# Patient Record
Sex: Female | Born: 1955
Health system: Southern US, Community
[De-identification: ages and names within clinical notes are randomized; demographics above are authoritative.]

## PROBLEM LIST (undated history)

## (undated) DIAGNOSIS — Z9221 Personal history of antineoplastic chemotherapy: Secondary | ICD-10-CM

## (undated) DIAGNOSIS — H269 Unspecified cataract: Secondary | ICD-10-CM

## (undated) DIAGNOSIS — G2581 Restless legs syndrome: Secondary | ICD-10-CM

## (undated) DIAGNOSIS — G4730 Sleep apnea, unspecified: Secondary | ICD-10-CM

## (undated) DIAGNOSIS — T4145XA Adverse effect of unspecified anesthetic, initial encounter: Secondary | ICD-10-CM

## (undated) DIAGNOSIS — C50412 Malignant neoplasm of upper-outer quadrant of left female breast: Secondary | ICD-10-CM

## (undated) DIAGNOSIS — Z803 Family history of malignant neoplasm of breast: Secondary | ICD-10-CM

## (undated) DIAGNOSIS — F419 Anxiety disorder, unspecified: Secondary | ICD-10-CM

## (undated) DIAGNOSIS — R232 Flushing: Secondary | ICD-10-CM

## (undated) DIAGNOSIS — T8859XA Other complications of anesthesia, initial encounter: Secondary | ICD-10-CM

## (undated) DIAGNOSIS — F32A Depression, unspecified: Secondary | ICD-10-CM

## (undated) DIAGNOSIS — C50919 Malignant neoplasm of unspecified site of unspecified female breast: Secondary | ICD-10-CM

## (undated) DIAGNOSIS — Z923 Personal history of irradiation: Secondary | ICD-10-CM

## (undated) DIAGNOSIS — M1990 Unspecified osteoarthritis, unspecified site: Secondary | ICD-10-CM

## (undated) DIAGNOSIS — Z808 Family history of malignant neoplasm of other organs or systems: Secondary | ICD-10-CM

## (undated) DIAGNOSIS — F329 Major depressive disorder, single episode, unspecified: Secondary | ICD-10-CM

## (undated) DIAGNOSIS — R519 Headache, unspecified: Secondary | ICD-10-CM

## (undated) DIAGNOSIS — S12100A Unspecified displaced fracture of second cervical vertebra, initial encounter for closed fracture: Secondary | ICD-10-CM

## (undated) DIAGNOSIS — M542 Cervicalgia: Secondary | ICD-10-CM

## (undated) DIAGNOSIS — K219 Gastro-esophageal reflux disease without esophagitis: Secondary | ICD-10-CM

## (undated) DIAGNOSIS — K819 Cholecystitis, unspecified: Secondary | ICD-10-CM

## (undated) HISTORY — DX: Family history of malignant neoplasm of other organs or systems: Z80.8

## (undated) HISTORY — PX: TARSAL TUNNEL RELEASE: SUR1099

## (undated) HISTORY — PX: BREAST BIOPSY: SHX20

## (undated) HISTORY — PX: FOOT SURGERY: SHX648

## (undated) HISTORY — PX: BUNIONECTOMY: SHX129

## (undated) HISTORY — DX: Flushing: R23.2

## (undated) HISTORY — DX: Malignant neoplasm of upper-outer quadrant of left female breast: C50.412

## (undated) HISTORY — PX: ELBOW SURGERY: SHX618

## (undated) HISTORY — PX: BACK SURGERY: SHX140

## (undated) HISTORY — DX: Anxiety disorder, unspecified: F41.9

## (undated) HISTORY — DX: Family history of malignant neoplasm of breast: Z80.3

## (undated) HISTORY — PX: BREAST LUMPECTOMY: SHX2

---

## 1999-06-08 ENCOUNTER — Other Ambulatory Visit: Admission: RE | Admit: 1999-06-08 | Discharge: 1999-06-08 | Payer: Self-pay | Admitting: Internal Medicine

## 1999-06-27 ENCOUNTER — Encounter: Payer: Self-pay | Admitting: Internal Medicine

## 1999-06-27 ENCOUNTER — Ambulatory Visit (HOSPITAL_COMMUNITY): Admission: RE | Admit: 1999-06-27 | Discharge: 1999-06-27 | Payer: Self-pay | Admitting: Internal Medicine

## 1999-07-19 ENCOUNTER — Other Ambulatory Visit: Admission: RE | Admit: 1999-07-19 | Discharge: 1999-07-19 | Payer: Self-pay | Admitting: Internal Medicine

## 2000-09-16 ENCOUNTER — Ambulatory Visit (HOSPITAL_COMMUNITY): Admission: RE | Admit: 2000-09-16 | Discharge: 2000-09-16 | Payer: Self-pay | Admitting: Internal Medicine

## 2000-09-16 ENCOUNTER — Encounter: Payer: Self-pay | Admitting: Internal Medicine

## 2000-12-11 ENCOUNTER — Encounter: Admission: RE | Admit: 2000-12-11 | Discharge: 2000-12-11 | Payer: Self-pay | Admitting: Internal Medicine

## 2000-12-11 ENCOUNTER — Encounter: Payer: Self-pay | Admitting: Internal Medicine

## 2001-01-30 ENCOUNTER — Encounter: Payer: Self-pay | Admitting: Internal Medicine

## 2001-01-30 ENCOUNTER — Encounter: Admission: RE | Admit: 2001-01-30 | Discharge: 2001-01-30 | Payer: Self-pay | Admitting: Internal Medicine

## 2002-01-15 ENCOUNTER — Other Ambulatory Visit: Admission: RE | Admit: 2002-01-15 | Discharge: 2002-01-15 | Payer: Self-pay | Admitting: Gynecology

## 2002-02-13 ENCOUNTER — Encounter: Payer: Self-pay | Admitting: Gynecology

## 2002-02-13 ENCOUNTER — Ambulatory Visit (HOSPITAL_COMMUNITY): Admission: RE | Admit: 2002-02-13 | Discharge: 2002-02-13 | Payer: Self-pay | Admitting: Gynecology

## 2003-02-19 ENCOUNTER — Ambulatory Visit (HOSPITAL_COMMUNITY): Admission: RE | Admit: 2003-02-19 | Discharge: 2003-02-19 | Payer: Self-pay | Admitting: Gynecology

## 2003-02-19 ENCOUNTER — Encounter: Payer: Self-pay | Admitting: Gynecology

## 2003-03-08 ENCOUNTER — Other Ambulatory Visit: Admission: RE | Admit: 2003-03-08 | Discharge: 2003-03-08 | Payer: Self-pay | Admitting: Gynecology

## 2003-04-13 ENCOUNTER — Encounter: Payer: Self-pay | Admitting: Orthopaedic Surgery

## 2003-04-13 ENCOUNTER — Encounter: Admission: RE | Admit: 2003-04-13 | Discharge: 2003-04-13 | Payer: Self-pay | Admitting: Orthopaedic Surgery

## 2004-04-11 ENCOUNTER — Ambulatory Visit (HOSPITAL_COMMUNITY): Admission: RE | Admit: 2004-04-11 | Discharge: 2004-04-11 | Payer: Self-pay | Admitting: Gynecology

## 2004-04-26 ENCOUNTER — Encounter: Admission: RE | Admit: 2004-04-26 | Discharge: 2004-04-26 | Payer: Self-pay | Admitting: Gynecology

## 2004-06-08 ENCOUNTER — Other Ambulatory Visit: Admission: RE | Admit: 2004-06-08 | Discharge: 2004-06-08 | Payer: Self-pay | Admitting: Gynecology

## 2005-02-01 ENCOUNTER — Encounter: Admission: RE | Admit: 2005-02-01 | Discharge: 2005-02-01 | Payer: Self-pay | Admitting: Orthopaedic Surgery

## 2005-05-16 ENCOUNTER — Ambulatory Visit (HOSPITAL_COMMUNITY): Admission: RE | Admit: 2005-05-16 | Discharge: 2005-05-16 | Payer: Self-pay | Admitting: Gynecology

## 2005-07-11 ENCOUNTER — Other Ambulatory Visit: Admission: RE | Admit: 2005-07-11 | Discharge: 2005-07-11 | Payer: Self-pay | Admitting: Gynecology

## 2006-06-03 ENCOUNTER — Ambulatory Visit (HOSPITAL_COMMUNITY): Admission: RE | Admit: 2006-06-03 | Discharge: 2006-06-03 | Payer: Self-pay | Admitting: Gynecology

## 2006-07-24 ENCOUNTER — Other Ambulatory Visit: Admission: RE | Admit: 2006-07-24 | Discharge: 2006-07-24 | Payer: Self-pay | Admitting: Gynecology

## 2006-12-24 ENCOUNTER — Encounter: Admission: RE | Admit: 2006-12-24 | Discharge: 2006-12-24 | Payer: Self-pay | Admitting: Orthopaedic Surgery

## 2007-02-14 ENCOUNTER — Ambulatory Visit (HOSPITAL_COMMUNITY): Admission: RE | Admit: 2007-02-14 | Discharge: 2007-02-14 | Payer: Self-pay | Admitting: Neurology

## 2007-03-16 ENCOUNTER — Emergency Department (HOSPITAL_COMMUNITY): Admission: EM | Admit: 2007-03-16 | Discharge: 2007-03-16 | Payer: Self-pay | Admitting: Emergency Medicine

## 2007-06-20 ENCOUNTER — Ambulatory Visit (HOSPITAL_COMMUNITY): Admission: RE | Admit: 2007-06-20 | Discharge: 2007-06-20 | Payer: Self-pay | Admitting: Gynecology

## 2007-08-13 ENCOUNTER — Other Ambulatory Visit: Admission: RE | Admit: 2007-08-13 | Discharge: 2007-08-13 | Payer: Self-pay | Admitting: Obstetrics and Gynecology

## 2008-06-25 ENCOUNTER — Ambulatory Visit (HOSPITAL_COMMUNITY): Admission: RE | Admit: 2008-06-25 | Discharge: 2008-06-25 | Payer: Self-pay | Admitting: Gynecology

## 2008-09-15 ENCOUNTER — Encounter: Payer: Self-pay | Admitting: Women's Health

## 2008-09-15 ENCOUNTER — Other Ambulatory Visit: Admission: RE | Admit: 2008-09-15 | Discharge: 2008-09-15 | Payer: Self-pay | Admitting: Obstetrics and Gynecology

## 2008-09-15 ENCOUNTER — Ambulatory Visit: Payer: Self-pay | Admitting: Women's Health

## 2009-06-30 ENCOUNTER — Ambulatory Visit (HOSPITAL_COMMUNITY): Admission: RE | Admit: 2009-06-30 | Discharge: 2009-06-30 | Payer: Self-pay | Admitting: Gynecology

## 2009-07-14 ENCOUNTER — Encounter: Admission: RE | Admit: 2009-07-14 | Discharge: 2009-07-14 | Payer: Self-pay | Admitting: Gynecology

## 2009-09-30 ENCOUNTER — Encounter: Payer: Self-pay | Admitting: Women's Health

## 2009-09-30 ENCOUNTER — Other Ambulatory Visit: Admission: RE | Admit: 2009-09-30 | Discharge: 2009-09-30 | Payer: Self-pay | Admitting: Obstetrics and Gynecology

## 2009-09-30 ENCOUNTER — Ambulatory Visit: Payer: Self-pay | Admitting: Women's Health

## 2009-10-03 ENCOUNTER — Ambulatory Visit: Payer: Self-pay | Admitting: Gynecology

## 2009-12-15 ENCOUNTER — Emergency Department (HOSPITAL_COMMUNITY): Admission: EM | Admit: 2009-12-15 | Discharge: 2009-12-15 | Payer: Self-pay | Admitting: Emergency Medicine

## 2010-12-24 ENCOUNTER — Encounter: Payer: Self-pay | Admitting: Gynecology

## 2010-12-25 ENCOUNTER — Encounter: Payer: Self-pay | Admitting: Gynecology

## 2011-04-03 HISTORY — PX: SPINAL FUSION: SHX223

## 2011-08-07 ENCOUNTER — Ambulatory Visit (INDEPENDENT_AMBULATORY_CARE_PROVIDER_SITE_OTHER): Payer: Self-pay | Admitting: Family Medicine

## 2011-08-07 DIAGNOSIS — E669 Obesity, unspecified: Secondary | ICD-10-CM

## 2011-08-09 ENCOUNTER — Other Ambulatory Visit (HOSPITAL_COMMUNITY)
Admission: RE | Admit: 2011-08-09 | Discharge: 2011-08-09 | Disposition: A | Discharge status: Home / Self Care | Payer: 59 | Source: Ambulatory Visit | Attending: Internal Medicine | Admitting: Internal Medicine

## 2011-08-09 ENCOUNTER — Other Ambulatory Visit (HOSPITAL_COMMUNITY): Payer: Self-pay | Admitting: Internal Medicine

## 2011-08-09 ENCOUNTER — Other Ambulatory Visit: Payer: Self-pay | Admitting: Internal Medicine

## 2011-08-09 DIAGNOSIS — Z01419 Encounter for gynecological examination (general) (routine) without abnormal findings: Secondary | ICD-10-CM | POA: Insufficient documentation

## 2011-08-09 DIAGNOSIS — Z1159 Encounter for screening for other viral diseases: Secondary | ICD-10-CM | POA: Insufficient documentation

## 2011-08-09 DIAGNOSIS — Z1231 Encounter for screening mammogram for malignant neoplasm of breast: Secondary | ICD-10-CM

## 2011-08-17 ENCOUNTER — Ambulatory Visit (HOSPITAL_COMMUNITY)
Admission: RE | Admit: 2011-08-17 | Discharge: 2011-08-17 | Disposition: A | Discharge status: Home / Self Care | Payer: 59 | Source: Ambulatory Visit | Attending: Internal Medicine | Admitting: Internal Medicine

## 2011-08-17 DIAGNOSIS — Z1231 Encounter for screening mammogram for malignant neoplasm of breast: Secondary | ICD-10-CM | POA: Insufficient documentation

## 2011-08-22 ENCOUNTER — Other Ambulatory Visit: Payer: Self-pay | Admitting: Internal Medicine

## 2011-08-22 DIAGNOSIS — R928 Other abnormal and inconclusive findings on diagnostic imaging of breast: Secondary | ICD-10-CM

## 2011-09-05 ENCOUNTER — Ambulatory Visit
Admission: RE | Admit: 2011-09-05 | Discharge: 2011-09-05 | Disposition: A | Discharge status: Home / Self Care | Payer: 59 | Source: Ambulatory Visit | Attending: Internal Medicine | Admitting: Internal Medicine

## 2011-09-05 DIAGNOSIS — R928 Other abnormal and inconclusive findings on diagnostic imaging of breast: Secondary | ICD-10-CM

## 2012-01-25 ENCOUNTER — Other Ambulatory Visit (HOSPITAL_COMMUNITY): Payer: Self-pay | Admitting: Neurosurgery

## 2012-01-25 DIAGNOSIS — M5137 Other intervertebral disc degeneration, lumbosacral region: Secondary | ICD-10-CM

## 2012-02-01 ENCOUNTER — Ambulatory Visit (HOSPITAL_COMMUNITY)
Admission: RE | Admit: 2012-02-01 | Discharge: 2012-02-01 | Disposition: A | Discharge status: Home / Self Care | Payer: 59 | Source: Ambulatory Visit | Attending: Neurosurgery | Admitting: Neurosurgery

## 2012-02-01 DIAGNOSIS — M5137 Other intervertebral disc degeneration, lumbosacral region: Secondary | ICD-10-CM

## 2012-02-01 DIAGNOSIS — Q762 Congenital spondylolisthesis: Secondary | ICD-10-CM | POA: Insufficient documentation

## 2012-02-01 DIAGNOSIS — M5126 Other intervertebral disc displacement, lumbar region: Secondary | ICD-10-CM | POA: Insufficient documentation

## 2012-02-01 DIAGNOSIS — N281 Cyst of kidney, acquired: Secondary | ICD-10-CM | POA: Insufficient documentation

## 2012-02-01 DIAGNOSIS — M79609 Pain in unspecified limb: Secondary | ICD-10-CM | POA: Insufficient documentation

## 2012-02-01 DIAGNOSIS — M545 Low back pain, unspecified: Secondary | ICD-10-CM | POA: Insufficient documentation

## 2012-02-01 DIAGNOSIS — Q7649 Other congenital malformations of spine, not associated with scoliosis: Secondary | ICD-10-CM | POA: Insufficient documentation

## 2012-02-01 DIAGNOSIS — M47817 Spondylosis without myelopathy or radiculopathy, lumbosacral region: Secondary | ICD-10-CM | POA: Insufficient documentation

## 2012-02-15 ENCOUNTER — Other Ambulatory Visit (HOSPITAL_COMMUNITY): Payer: Self-pay | Admitting: Internal Medicine

## 2012-02-15 DIAGNOSIS — N289 Disorder of kidney and ureter, unspecified: Secondary | ICD-10-CM

## 2012-02-18 ENCOUNTER — Ambulatory Visit (HOSPITAL_COMMUNITY)
Admission: RE | Admit: 2012-02-18 | Discharge: 2012-02-18 | Disposition: A | Discharge status: Home / Self Care | Payer: 59 | Source: Ambulatory Visit | Attending: Internal Medicine | Admitting: Internal Medicine

## 2012-02-18 DIAGNOSIS — N289 Disorder of kidney and ureter, unspecified: Secondary | ICD-10-CM | POA: Insufficient documentation

## 2012-02-21 ENCOUNTER — Other Ambulatory Visit: Payer: Self-pay | Admitting: Neurosurgery

## 2012-02-25 ENCOUNTER — Other Ambulatory Visit: Payer: Self-pay | Admitting: Internal Medicine

## 2012-02-25 DIAGNOSIS — N6009 Solitary cyst of unspecified breast: Secondary | ICD-10-CM

## 2012-03-07 ENCOUNTER — Ambulatory Visit
Admission: RE | Admit: 2012-03-07 | Discharge: 2012-03-07 | Disposition: A | Discharge status: Home / Self Care | Payer: 59 | Source: Ambulatory Visit | Attending: Internal Medicine | Admitting: Internal Medicine

## 2012-03-07 DIAGNOSIS — N6009 Solitary cyst of unspecified breast: Secondary | ICD-10-CM

## 2012-03-14 ENCOUNTER — Other Ambulatory Visit (HOSPITAL_COMMUNITY): Payer: Self-pay | Admitting: Orthopaedic Surgery

## 2012-03-14 DIAGNOSIS — M25562 Pain in left knee: Secondary | ICD-10-CM

## 2012-03-19 ENCOUNTER — Encounter (HOSPITAL_COMMUNITY): Payer: Self-pay | Admitting: Pharmacy Technician

## 2012-03-20 ENCOUNTER — Ambulatory Visit (HOSPITAL_COMMUNITY)
Admission: RE | Admit: 2012-03-20 | Discharge: 2012-03-20 | Disposition: A | Discharge status: Home / Self Care | Payer: 59 | Source: Ambulatory Visit | Attending: Orthopaedic Surgery | Admitting: Orthopaedic Surgery

## 2012-03-20 DIAGNOSIS — M25562 Pain in left knee: Secondary | ICD-10-CM

## 2012-03-20 DIAGNOSIS — M25469 Effusion, unspecified knee: Secondary | ICD-10-CM | POA: Insufficient documentation

## 2012-03-20 DIAGNOSIS — M1710 Unilateral primary osteoarthritis, unspecified knee: Secondary | ICD-10-CM | POA: Insufficient documentation

## 2012-03-28 ENCOUNTER — Encounter (HOSPITAL_COMMUNITY): Payer: Self-pay

## 2012-03-28 ENCOUNTER — Encounter (HOSPITAL_COMMUNITY)
Admission: RE | Admit: 2012-03-28 | Discharge: 2012-03-28 | Disposition: A | Discharge status: Home / Self Care | Payer: 59 | Source: Ambulatory Visit | Attending: Neurosurgery | Admitting: Neurosurgery

## 2012-03-28 HISTORY — DX: Unspecified osteoarthritis, unspecified site: M19.90

## 2012-03-28 LAB — CBC
HCT: 39.1 % (ref 36.0–46.0)
Hemoglobin: 13 g/dL (ref 12.0–15.0)
MCH: 29.7 pg (ref 26.0–34.0)
MCHC: 33.2 g/dL (ref 30.0–36.0)
MCV: 89.5 fL (ref 78.0–100.0)
Platelets: 246 10*3/uL (ref 150–400)
RBC: 4.37 MIL/uL (ref 3.87–5.11)
RDW: 13.2 % (ref 11.5–15.5)
WBC: 5.5 10*3/uL (ref 4.0–10.5)

## 2012-03-28 LAB — TYPE AND SCREEN
ABO/RH(D): A POS
Antibody Screen: NEGATIVE

## 2012-03-28 LAB — ABO/RH: ABO/RH(D): A POS

## 2012-03-28 LAB — SURGICAL PCR SCREEN
MRSA, PCR: NEGATIVE
Staphylococcus aureus: NEGATIVE

## 2012-03-28 NOTE — Pre-Procedure Instructions (Signed)
20 Alexis Fox  03/28/2012   Your procedure is scheduled on:  04/04/2012  Report to [facility] at 5:30 AM.  Call this number if you have problems the morning of surgery: [phone]   Remember: Dicontinue Aspirin, Coumadin, Effient, Plavix and herbal medications.   Do not eat food:After Midnight.  May have clear liquids: up to 4 Hours before arrival (1:30 AM).  Clear liquids include soda, tea, black coffee, apple or grape juice, broth.  Take these medicines the morning of surgery with A SIP OF WATER: Celexa, Sinemet, Klonopin, Nasonex, Mirapex   Do not wear jewelry, make-up or nail polish.  Do not wear lotions, powders, or perfumes. You may wear deodorant.  Do not shave 48 hours prior to surgery.  Do not bring valuables to the hospital.  Contacts, dentures or bridgework may not be worn into surgery.  Leave suitcase in the car. After surgery it may be brought to your room.  For patients admitted to the hospital, checkout time is 11:00 AM the day of discharge.   Patients discharged the day of surgery will not be allowed to drive home.   Special Instructions: CHG Shower Use Special Wash: 1/2 bottle night before surgery and 1/2 bottle morning of surgery.   Please read over the following fact sheets that you were given: Pain Booklet, Coughing and Deep Breathing, Blood Transfusion Information, MRSA Information and Surgical Site Infection Prevention

## 2012-04-03 MED ORDER — CEFAZOLIN SODIUM-DEXTROSE 2-3 GM-% IV SOLR
2.0000 g | INTRAVENOUS | Status: AC
  Administered 2012-04-04: 2 g via INTRAVENOUS

## 2012-04-03 NOTE — H&P (Signed)
Alexis Fox  #[id number] DOB:  [birth year] 02/13/2012:    Alexis Fox returns today to review the MRI of her lumbar spine.  She had significant spondylosis at L5-S1 to the right with a foraminal encroachment at the right L5 nerve root from the supra-articular process of the facet joint as well as a disc protrusion. She has disc degeneration at this level as well.  She has some milder facet arthropathy at L4-5 on the left and central annular tear without significant L4 nerve root compression.  She says that her pain in the right leg continues to be a significant problem for her.    I have advised her that if we are going to treat this, I would recommend decompression and fusion at the L5-S1 level.  I do not believe the L4-5 level is sufficiently affected that it would need to be addressed.  Incidental note was made of an irregularly margined right inferior renal pole cyst and I suggested that she followup with her primary physician regarding.  Radiology has recommended an ultrasound of her kidney.  She wishes to go ahead and work on her physical conditioning and to welcome her grandchild in June and potentially go ahead with surgery in July and I will plan on seeing her back in the office in July to see how she is doing and make further plans.         Danae Orleans. 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  She describes that one year ago she had low back pain which resolved after about 2 months, but recently she has had low back and right greater than left lower extremity pain.  She describes that this goes into her right thigh and her right leg gives way at her knee and she has had some recent left leg tingling and left foot numbness.  She has had years of low back pain.  She says it has been much worse  over the last 1 to 2 months and that she has numbness in her right foot over the last year.  She says this began after she lifted Christmas boxes about a year ago.  She has had injections by Dr. Alvester Morin on 04/26/2011, right L5-S1 block and another injection at another level neither of which gave her any relief.  She takes Vicodin 5/325 "periodically" and Voltaren 75 mg "periodically".  She has a history of restless leg syndrome and depression.  She has had prior surgery on her right elbow, multiple foot and ankle surgeries since her teens.  She notes that she has gained a considerable amount of weight in the last 7 years and this is approximately 60 lbs.  She says, "I hurt in lots of places."  She says that she has impaired feeling in the outside toes and heel on the right foot.  She denies significant weakness.  She does note a history of chronic low back pain and pain across the lumbosacral junction, along the side of both of her thighs.    REVIEW OF SYSTEMS:   Review of Systems was reviewed with the patient.  Pertinent positives include wears glasses, leg pain while walking, leg weakness, back pain, leg pain, depression, restless leg syndrome.    PAST MEDICAL HISTORY:  Current Medical Conditions:  As previously described.      Prior Operations and Hospitalizations:  As previously described.      Medications and Allergies:  Loratadine 10 mg qd, Citalopram 40 mg qd, Estradiol 0.5 mg qd, Pramipexole 0.5 mg qd, Medroxyprogesterone 2.5 mg qd, Clonazepam 1 mg qd, Fluticasone qd, Sinemet 25/100 prn, Diclofenac 75 mg bid prn and Hydrocodone prn.  No known drug allergies.      Height and Weight:  She is 5', 4" tall and 190 lbs.  BMI is 32.6.    FAMILY HISTORY:    Mother is 55 in poor health with a pacemaker and pulmonary fibrosis.  Father died in his 1's of astrocytoma.    SOCIAL HISTORY:    She denies tobacco, alcohol or drug use.    DIAGNOSTIC STUDIES:   Plain radiographs were obtained in the office  today which show retrolisthesis of L5 on S1 and sacralized S1-S2 levels with degenerative disc disease L5-S1 and L4-5 levels with four lumbar vertebrae.    PHYSICAL EXAMINATION:      General Appearance:  Alexis Fox is a pleasant, cooperative woman in no acute distress.      Blood Pressure, Pulse and Respiratory Rate:  Her blood pressure is 114/70.  Heart rate is 78 and regular.  Respiratory rate is 18.        HEENT - normocephalic, atraumatic.  The pupils are equal, round and reactive to light.  The extraocular muscles are intact.  Sclerae - white.  Conjunctiva - pink.  Oropharynx benign.  Uvula midline.     Neck - there are no masses, meningismus, deformities, tracheal deviation, jugular vein distention or carotid bruits.  There is normal cervical range of motion.  Spurlings' test is negative without reproducible radicular pain turning the patient's head to either side.  Lhermitte's sign is not present with axial compression.      Respiratory - there is normal respiratory effort with good intercostal function.  Lungs are clear to auscultation.  There are no rales, rhonchi or wheezes.      Cardiovascular - the heart has regular rate and rhythm to auscultation.  No murmurs are appreciated.  There is no extremity edema, cyanosis or clubbing.  There are palpable pedal pulses.      Abdomen - soft, nontender, no hepatosplenomegaly appreciated or masses.  There are active bowel sounds.  No guarding or rebound.      Musculoskeletal Examination - she has pain at the lumbosacral junction.  She is able to bend to touch her toes, able to stand on her heels and toes.  She has a negative straight leg raise bilaterally.    NEUROLOGICAL EXAMINATION: The patient is oriented to time, person and place and has good recall of both recent and remote memory with normal attention span and concentration.  The patient speaks with clear and fluent speech and exhibits normal language function and appropriate fund of  knowledge.      Cranial Nerve Examination - pupils are equal, round and reactive to light.  Extraocular movements are full.  Visual fields are full to confrontational testing.  Facial sensation and facial movement are symmetric and intact.  Hearing is intact to finger rub.  Palate is upgoing.  Shoulder shrug is symmetric.  Tongue protrudes in the midline.      Motor Examination - motor strength is 5/5 in the bilateral deltoids, biceps, triceps, handgrips, wrist extensors, interosseous.  In the lower extremities motor strength is 5/5 in hip flexion,  extension, quadriceps, hamstrings, plantar flexion, dorsiflexion and extensor hallucis longus.      Sensory Examination - normal to light touch and pinprick sensation in the upper and lower extremities with the exception of decreased pin sensation in L5 and S1 distributions on the right.      Deep Tendon Reflexes - 2 in the biceps, tricep and brachioradialis, 2 at the knees, 2 at the left ankle, absent at the right ankle.  Great toes are downgoing to plantar stimulation.      Cerebellar Examination - normal coordination in upper and lower extremities and normal rapid alternating movements.  Romberg test is negative.    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  She has absent ankle jerk on the right.  She has hypalgesia to pinprick in L5 and S1 distributions.    I have recommended Alexis Fox get an MRI done of her lumbar spine.  She wants to do this in an open scanner or with premedication.  We will plan on doing the MRI in the near future and I will see her back after that has been done.    [facility]    Danae Orleans. Venetia Maxon, M.D.

## 2012-04-04 ENCOUNTER — Inpatient Hospital Stay (HOSPITAL_COMMUNITY)
Admission: RE | Admit: 2012-04-04 | Discharge: 2012-04-08 | DRG: 460 | Disposition: A | Discharge status: Home / Self Care | Payer: 59 | Source: Ambulatory Visit | Attending: Neurosurgery | Admitting: Neurosurgery

## 2012-04-04 ENCOUNTER — Encounter (HOSPITAL_COMMUNITY)
Admission: RE | Disposition: A | Discharge status: Home / Self Care | Payer: Self-pay | Source: Ambulatory Visit | Attending: Neurosurgery

## 2012-04-04 ENCOUNTER — Encounter (HOSPITAL_COMMUNITY): Payer: Self-pay | Admitting: *Deleted

## 2012-04-04 ENCOUNTER — Ambulatory Visit (HOSPITAL_COMMUNITY): Payer: 59 | Admitting: Anesthesiology

## 2012-04-04 ENCOUNTER — Inpatient Hospital Stay (HOSPITAL_COMMUNITY): Payer: 59

## 2012-04-04 ENCOUNTER — Encounter (HOSPITAL_COMMUNITY): Payer: Self-pay | Admitting: Anesthesiology

## 2012-04-04 ENCOUNTER — Encounter (HOSPITAL_COMMUNITY): Payer: Self-pay | Admitting: General Practice

## 2012-04-04 ENCOUNTER — Ambulatory Visit (HOSPITAL_COMMUNITY): Payer: 59

## 2012-04-04 DIAGNOSIS — M4120 Other idiopathic scoliosis, site unspecified: Secondary | ICD-10-CM | POA: Diagnosis present

## 2012-04-04 DIAGNOSIS — I959 Hypotension, unspecified: Secondary | ICD-10-CM | POA: Diagnosis present

## 2012-04-04 DIAGNOSIS — M47817 Spondylosis without myelopathy or radiculopathy, lumbosacral region: Secondary | ICD-10-CM | POA: Diagnosis present

## 2012-04-04 DIAGNOSIS — M5137 Other intervertebral disc degeneration, lumbosacral region: Secondary | ICD-10-CM | POA: Diagnosis present

## 2012-04-04 DIAGNOSIS — M4310 Spondylolisthesis, site unspecified: Principal | ICD-10-CM | POA: Diagnosis present

## 2012-04-04 DIAGNOSIS — Z01812 Encounter for preprocedural laboratory examination: Secondary | ICD-10-CM

## 2012-04-04 DIAGNOSIS — Z79899 Other long term (current) drug therapy: Secondary | ICD-10-CM

## 2012-04-04 DIAGNOSIS — M51379 Other intervertebral disc degeneration, lumbosacral region without mention of lumbar back pain or lower extremity pain: Secondary | ICD-10-CM | POA: Diagnosis present

## 2012-04-04 HISTORY — DX: Major depressive disorder, single episode, unspecified: F32.9

## 2012-04-04 HISTORY — DX: Depression, unspecified: F32.A

## 2012-04-04 HISTORY — PX: LUMBAR DISC SURGERY: SHX700

## 2012-04-04 SURGERY — TRANSFORAMINAL LUMBAR INTERBODY FUSION (TLIF) WITH PEDICLE SCREW FIXATION 1 LEVEL
Anesthesia: General | Site: Back | Wound class: Clean

## 2012-04-04 MED ORDER — PHENOL 1.4 % MT LIQD: 1.0000 | OROMUCOSAL | Status: DC | PRN

## 2012-04-04 MED ORDER — CEFAZOLIN SODIUM-DEXTROSE 2-3 GM-% IV SOLR
INTRAVENOUS | Status: AC
  Filled 2012-04-04: qty 50

## 2012-04-04 MED ORDER — DIPHENHYDRAMINE HCL 12.5 MG/5ML PO ELIX: 12.5000 mg | ORAL_SOLUTION | Freq: Four times a day (QID) | ORAL | Status: DC | PRN

## 2012-04-04 MED ORDER — NEOSTIGMINE METHYLSULFATE 1 MG/ML IJ SOLN
INTRAMUSCULAR | Status: DC | PRN
  Administered 2012-04-04: 5 mg via INTRAVENOUS

## 2012-04-04 MED ORDER — CLONAZEPAM 1 MG PO TABS
1.0000 mg | ORAL_TABLET | Freq: Every day | ORAL | Status: DC
  Administered 2012-04-05 – 2012-04-07 (×3): 1 mg via ORAL
  Filled 2012-04-04 (×3): qty 1

## 2012-04-04 MED ORDER — SODIUM CHLORIDE 0.9 % IV SOLN
INTRAVENOUS | Status: AC
  Filled 2012-04-04: qty 500

## 2012-04-04 MED ORDER — DIAZEPAM 5 MG PO TABS
5.0000 mg | ORAL_TABLET | Freq: Four times a day (QID) | ORAL | Status: DC | PRN
  Administered 2012-04-04 – 2012-04-05 (×4): 5 mg via ORAL
  Filled 2012-04-04 (×4): qty 1

## 2012-04-04 MED ORDER — OXYCODONE-ACETAMINOPHEN 5-325 MG PO TABS
1.0000 | ORAL_TABLET | ORAL | Status: DC | PRN
  Administered 2012-04-04 – 2012-04-06 (×7): 2 via ORAL
  Filled 2012-04-04 (×8): qty 2

## 2012-04-04 MED ORDER — ONDANSETRON HCL 4 MG/2ML IJ SOLN: 4.0000 mg | Freq: Four times a day (QID) | INTRAMUSCULAR | Status: DC | PRN

## 2012-04-04 MED ORDER — SODIUM CHLORIDE 0.9 % IJ SOLN: 3.0000 mL | INTRAMUSCULAR | Status: DC | PRN

## 2012-04-04 MED ORDER — CARBIDOPA-LEVODOPA 25-100 MG PO TABS
1.0000 | ORAL_TABLET | Freq: Every day | ORAL | Status: DC | PRN
  Filled 2012-04-04: qty 1

## 2012-04-04 MED ORDER — CITALOPRAM HYDROBROMIDE 40 MG PO TABS
40.0000 mg | ORAL_TABLET | Freq: Every day | ORAL | Status: DC
  Administered 2012-04-04 – 2012-04-06 (×3): 40 mg via ORAL
  Filled 2012-04-04 (×5): qty 1

## 2012-04-04 MED ORDER — ROCURONIUM BROMIDE 100 MG/10ML IV SOLN
INTRAVENOUS | Status: DC | PRN
  Administered 2012-04-04: 20 mg via INTRAVENOUS
  Administered 2012-04-04: 50 mg via INTRAVENOUS
  Administered 2012-04-04: 10 mg via INTRAVENOUS

## 2012-04-04 MED ORDER — SODIUM CHLORIDE 0.9 % IR SOLN
Status: DC | PRN
  Administered 2012-04-04: 08:00:00

## 2012-04-04 MED ORDER — CEFAZOLIN SODIUM 1-5 GM-% IV SOLN
1.0000 g | Freq: Three times a day (TID) | INTRAVENOUS | Status: AC
  Administered 2012-04-04 (×2): 1 g via INTRAVENOUS
  Filled 2012-04-04 (×2): qty 50

## 2012-04-04 MED ORDER — ONDANSETRON HCL 4 MG/2ML IJ SOLN
4.0000 mg | INTRAMUSCULAR | Status: DC | PRN
  Administered 2012-04-05 – 2012-04-06 (×3): 4 mg via INTRAVENOUS
  Filled 2012-04-04 (×3): qty 2

## 2012-04-04 MED ORDER — DIPHENHYDRAMINE HCL 50 MG/ML IJ SOLN: 12.5000 mg | Freq: Four times a day (QID) | INTRAMUSCULAR | Status: DC | PRN

## 2012-04-04 MED ORDER — FLUTICASONE PROPIONATE 50 MCG/ACT NA SUSP
1.0000 | Freq: Every day | NASAL | Status: DC
  Administered 2012-04-04 – 2012-04-05 (×2): 1 via NASAL
  Filled 2012-04-04: qty 16

## 2012-04-04 MED ORDER — KCL IN DEXTROSE-NACL 20-5-0.45 MEQ/L-%-% IV SOLN
INTRAVENOUS | Status: DC
  Administered 2012-04-04 – 2012-04-06 (×4): via INTRAVENOUS
  Filled 2012-04-04 (×10): qty 1000

## 2012-04-04 MED ORDER — ZOLPIDEM TARTRATE 10 MG PO TABS: 10.0000 mg | ORAL_TABLET | Freq: Every evening | ORAL | Status: DC | PRN

## 2012-04-04 MED ORDER — MENTHOL 3 MG MT LOZG: 1.0000 | LOZENGE | OROMUCOSAL | Status: DC | PRN

## 2012-04-04 MED ORDER — GLYCOPYRROLATE 0.2 MG/ML IJ SOLN
INTRAMUSCULAR | Status: DC | PRN
  Administered 2012-04-04: .7 mg via INTRAVENOUS

## 2012-04-04 MED ORDER — HYDROMORPHONE HCL PF 1 MG/ML IJ SOLN
INTRAMUSCULAR | Status: AC
  Filled 2012-04-04: qty 1

## 2012-04-04 MED ORDER — HYDROCODONE-ACETAMINOPHEN 5-325 MG PO TABS
1.0000 | ORAL_TABLET | ORAL | Status: DC | PRN
  Administered 2012-04-06 – 2012-04-07 (×4): 1 via ORAL
  Filled 2012-04-04 (×4): qty 1

## 2012-04-04 MED ORDER — SODIUM CHLORIDE 0.9 % IJ SOLN
3.0000 mL | Freq: Two times a day (BID) | INTRAMUSCULAR | Status: DC
  Administered 2012-04-05 – 2012-04-08 (×2): 3 mL via INTRAVENOUS

## 2012-04-04 MED ORDER — SUFENTANIL CITRATE 50 MCG/ML IV SOLN
INTRAVENOUS | Status: DC | PRN
  Administered 2012-04-04: 15 ug via INTRAVENOUS
  Administered 2012-04-04: 10 ug via INTRAVENOUS

## 2012-04-04 MED ORDER — ONDANSETRON HCL 4 MG/2ML IJ SOLN: 4.0000 mg | Freq: Once | INTRAMUSCULAR | Status: DC | PRN

## 2012-04-04 MED ORDER — DOCUSATE SODIUM 100 MG PO CAPS
100.0000 mg | ORAL_CAPSULE | Freq: Two times a day (BID) | ORAL | Status: DC
  Administered 2012-04-04 – 2012-04-07 (×6): 100 mg via ORAL
  Filled 2012-04-04 (×6): qty 1

## 2012-04-04 MED ORDER — BUPIVACAINE HCL (PF) 0.5 % IJ SOLN
INTRAMUSCULAR | Status: DC | PRN
  Administered 2012-04-04: 10 mL

## 2012-04-04 MED ORDER — NALOXONE HCL 0.4 MG/ML IJ SOLN: 0.4000 mg | INTRAMUSCULAR | Status: DC | PRN

## 2012-04-04 MED ORDER — HYDROMORPHONE HCL PF 1 MG/ML IJ SOLN
0.2500 mg | INTRAMUSCULAR | Status: DC | PRN
  Administered 2012-04-04 (×3): 0.25 mg via INTRAVENOUS

## 2012-04-04 MED ORDER — LIDOCAINE HCL (CARDIAC) 20 MG/ML IV SOLN
INTRAVENOUS | Status: DC | PRN
  Administered 2012-04-04: 8 mg via INTRAVENOUS

## 2012-04-04 MED ORDER — HEMOSTATIC AGENTS (NO CHARGE) OPTIME
TOPICAL | Status: DC | PRN
  Administered 2012-04-04: 1 via TOPICAL

## 2012-04-04 MED ORDER — PROPOFOL 10 MG/ML IV EMUL
INTRAVENOUS | Status: DC | PRN
  Administered 2012-04-04: 200 mg via INTRAVENOUS

## 2012-04-04 MED ORDER — SODIUM CHLORIDE 0.9 % IJ SOLN: 9.0000 mL | INTRAMUSCULAR | Status: DC | PRN

## 2012-04-04 MED ORDER — ONDANSETRON HCL 4 MG/2ML IJ SOLN
INTRAMUSCULAR | Status: DC | PRN
  Administered 2012-04-04: 4 mg via INTRAVENOUS

## 2012-04-04 MED ORDER — SODIUM CHLORIDE 0.9 % IV SOLN: 250.0000 mL | INTRAVENOUS | Status: DC

## 2012-04-04 MED ORDER — MORPHINE SULFATE (PF) 1 MG/ML IV SOLN
INTRAVENOUS | Status: DC
  Administered 2012-04-04: 25 mg via INTRAVENOUS
  Administered 2012-04-04: 8 mg via INTRAVENOUS
  Administered 2012-04-04: 11 mg via INTRAVENOUS
  Administered 2012-04-04: 3 mg via INTRAVENOUS
  Administered 2012-04-05: 5.99 mg via INTRAVENOUS
  Administered 2012-04-05: 6 mg via INTRAVENOUS
  Administered 2012-04-05: 5 mg via INTRAVENOUS
  Administered 2012-04-05: 2 mg via INTRAVENOUS
  Filled 2012-04-04: qty 25

## 2012-04-04 MED ORDER — 0.9 % SODIUM CHLORIDE (POUR BTL) OPTIME
TOPICAL | Status: DC | PRN
  Administered 2012-04-04: 1000 mL

## 2012-04-04 MED ORDER — MORPHINE SULFATE (PF) 1 MG/ML IV SOLN
INTRAVENOUS | Status: AC
  Administered 2012-04-04: 11:00:00
  Filled 2012-04-04: qty 25

## 2012-04-04 MED ORDER — ACETAMINOPHEN 650 MG RE SUPP: 650.0000 mg | RECTAL | Status: DC | PRN

## 2012-04-04 MED ORDER — LIDOCAINE-EPINEPHRINE 1 %-1:100000 IJ SOLN
INTRAMUSCULAR | Status: DC | PRN
  Administered 2012-04-04: 10 mL

## 2012-04-04 MED ORDER — PHENYLEPHRINE HCL 10 MG/ML IJ SOLN
10.0000 mg | INTRAVENOUS | Status: DC | PRN
  Administered 2012-04-04: 20 ug/min via INTRAVENOUS

## 2012-04-04 MED ORDER — LACTATED RINGERS IV SOLN
INTRAVENOUS | Status: DC | PRN
  Administered 2012-04-04 (×2): via INTRAVENOUS

## 2012-04-04 MED ORDER — PRAMIPEXOLE DIHYDROCHLORIDE 0.25 MG PO TABS
0.5000 mg | ORAL_TABLET | Freq: Every day | ORAL | Status: DC
  Administered 2012-04-04 – 2012-04-07 (×4): 0.5 mg via ORAL
  Filled 2012-04-04 (×5): qty 2

## 2012-04-04 MED ORDER — CYCLOSPORINE 0.05 % OP EMUL
1.0000 [drp] | Freq: Two times a day (BID) | OPHTHALMIC | Status: DC
  Administered 2012-04-04: 1 [drp] via OPHTHALMIC
  Filled 2012-04-04 (×4): qty 1

## 2012-04-04 MED ORDER — ACETAMINOPHEN 325 MG PO TABS
650.0000 mg | ORAL_TABLET | ORAL | Status: DC | PRN
  Administered 2012-04-06 (×2): 650 mg via ORAL
  Filled 2012-04-04 (×2): qty 2

## 2012-04-04 MED ORDER — BACITRACIN 50000 UNITS IM SOLR
INTRAMUSCULAR | Status: AC
  Filled 2012-04-04: qty 1

## 2012-04-04 MED ORDER — THROMBIN 20000 UNITS EX KIT
PACK | CUTANEOUS | Status: DC | PRN
  Administered 2012-04-04: 20000 [IU] via TOPICAL

## 2012-04-04 MED ORDER — MIDAZOLAM HCL 5 MG/5ML IJ SOLN
INTRAMUSCULAR | Status: DC | PRN
  Administered 2012-04-04: 0.5 mg via INTRAVENOUS

## 2012-04-04 MED ORDER — EPHEDRINE SULFATE 50 MG/ML IJ SOLN
INTRAMUSCULAR | Status: DC | PRN
  Administered 2012-04-04: 10 mg via INTRAVENOUS

## 2012-04-04 SURGICAL SUPPLY — 73 items
BAG DECANTER FOR FLEXI CONT (MISCELLANEOUS) ×3 IMPLANT
BENZOIN TINCTURE PRP APPL 2/3 (GAUZE/BANDAGES/DRESSINGS) ×3 IMPLANT
BLADE SURG ROTATE 9660 (MISCELLANEOUS) IMPLANT
BONE VOID FILLER STRIP 10CC (Bone Implant) ×3 IMPLANT
BUR MATCHSTICK NEURO 3.0 LAGG (BURR) ×3 IMPLANT
BUR PRECISION FLUTE 5.0 (BURR) ×3 IMPLANT
CAGE TLIF 9MM (Cage) ×3 IMPLANT
CANISTER SUCTION 2500CC (MISCELLANEOUS) ×3 IMPLANT
CLOTH BEACON ORANGE TIMEOUT ST (SAFETY) ×3 IMPLANT
CONT SPEC 4OZ CLIKSEAL STRL BL (MISCELLANEOUS) ×6 IMPLANT
COVER BACK TABLE 24X17X13 BIG (DRAPES) IMPLANT
COVER TABLE BACK 60X90 (DRAPES) ×3 IMPLANT
DERMABOND ADVANCED (GAUZE/BANDAGES/DRESSINGS) ×1
DERMABOND ADVANCED .7 DNX12 (GAUZE/BANDAGES/DRESSINGS) ×2 IMPLANT
DRAPE C-ARM 42X72 X-RAY (DRAPES) ×6 IMPLANT
DRAPE LAPAROTOMY 100X72X124 (DRAPES) ×3 IMPLANT
DRAPE POUCH INSTRU U-SHP 10X18 (DRAPES) ×3 IMPLANT
DRAPE SURG 17X23 STRL (DRAPES) ×3 IMPLANT
DRESSING TELFA 8X3 (GAUZE/BANDAGES/DRESSINGS) ×3 IMPLANT
DURAPREP 26ML APPLICATOR (WOUND CARE) ×3 IMPLANT
DURAPREP 6ML APPLICATOR 50/CS (WOUND CARE) ×3 IMPLANT
ELECT REM PT RETURN 9FT ADLT (ELECTROSURGICAL) ×3
ELECTRODE REM PT RTRN 9FT ADLT (ELECTROSURGICAL) ×2 IMPLANT
GAUZE SPONGE 4X4 16PLY XRAY LF (GAUZE/BANDAGES/DRESSINGS) IMPLANT
GLOVE BIO SURGEON STRL SZ8 (GLOVE) ×9 IMPLANT
GLOVE BIOGEL M SZ8.5 STRL (GLOVE) ×3 IMPLANT
GLOVE BIOGEL PI IND STRL 8 (GLOVE) ×4 IMPLANT
GLOVE BIOGEL PI IND STRL 8.5 (GLOVE) ×4 IMPLANT
GLOVE BIOGEL PI INDICATOR 8 (GLOVE) ×2
GLOVE BIOGEL PI INDICATOR 8.5 (GLOVE) ×2
GLOVE ECLIPSE 8.0 STRL XLNG CF (GLOVE) ×6 IMPLANT
GLOVE EXAM NITRILE LRG STRL (GLOVE) IMPLANT
GLOVE EXAM NITRILE MD LF STRL (GLOVE) ×3 IMPLANT
GLOVE EXAM NITRILE XL STR (GLOVE) IMPLANT
GLOVE EXAM NITRILE XS STR PU (GLOVE) IMPLANT
GOWN BRE IMP SLV AUR LG STRL (GOWN DISPOSABLE) IMPLANT
GOWN BRE IMP SLV AUR XL STRL (GOWN DISPOSABLE) ×12 IMPLANT
GOWN STRL REIN 2XL LVL4 (GOWN DISPOSABLE) ×6 IMPLANT
KIT BASIN OR (CUSTOM PROCEDURE TRAY) ×3 IMPLANT
KIT INFUSE MEDIUM (Orthopedic Implant) ×3 IMPLANT
KIT POSITION SURG JACKSON T1 (MISCELLANEOUS) ×3 IMPLANT
KIT ROOM TURNOVER OR (KITS) ×3 IMPLANT
MILL MEDIUM DISP (BLADE) ×3 IMPLANT
NEEDLE HYPO 25X1 1.5 SAFETY (NEEDLE) ×3 IMPLANT
NEEDLE SPNL 18GX3.5 QUINCKE PK (NEEDLE) ×3 IMPLANT
NS IRRIG 1000ML POUR BTL (IV SOLUTION) ×3 IMPLANT
PACK LAMINECTOMY NEURO (CUSTOM PROCEDURE TRAY) ×3 IMPLANT
PAD ARMBOARD 7.5X6 YLW CONV (MISCELLANEOUS) ×9 IMPLANT
PATTIES SURGICAL .5 X.5 (GAUZE/BANDAGES/DRESSINGS) IMPLANT
PATTIES SURGICAL .5 X1 (DISPOSABLE) IMPLANT
PATTIES SURGICAL 1X1 (DISPOSABLE) IMPLANT
SCREW 40MM (Screw) ×3 IMPLANT
SCREW PEDICLE 45MM (Screw) ×3 IMPLANT
SCREW POLYAX 6.5X45MM (Screw) ×6 IMPLANT
SCREW SET SPINAL STD HEXALOBE (Screw) ×12 IMPLANT
SPONGE GAUZE 4X4 12PLY (GAUZE/BANDAGES/DRESSINGS) ×3 IMPLANT
SPONGE LAP 4X18 X RAY DECT (DISPOSABLE) IMPLANT
SPONGE SURGIFOAM ABS GEL 100 (HEMOSTASIS) ×3 IMPLANT
STAPLER SKIN PROX WIDE 3.9 (STAPLE) IMPLANT
STRIP CLOSURE SKIN 1/2X4 (GAUZE/BANDAGES/DRESSINGS) ×3 IMPLANT
SUT VIC AB 1 CT1 18XBRD ANBCTR (SUTURE) ×2 IMPLANT
SUT VIC AB 1 CT1 8-18 (SUTURE) ×1
SUT VIC AB 2-0 CT1 18 (SUTURE) ×3 IMPLANT
SUT VIC AB 3-0 SH 8-18 (SUTURE) ×6 IMPLANT
SYR 20ML ECCENTRIC (SYRINGE) ×3 IMPLANT
SYR 3ML LL SCALE MARK (SYRINGE) ×9 IMPLANT
SYR 5ML LL (SYRINGE) IMPLANT
TAPE CLOTH SURG 4X10 WHT LF (GAUZE/BANDAGES/DRESSINGS) ×3 IMPLANT
TOWEL OR 17X24 6PK STRL BLUE (TOWEL DISPOSABLE) ×3 IMPLANT
TOWEL OR 17X26 10 PK STRL BLUE (TOWEL DISPOSABLE) ×3 IMPLANT
TRAP SPECIMEN MUCOUS 40CC (MISCELLANEOUS) ×3 IMPLANT
TRAY FOLEY CATH 14FRSI W/METER (CATHETERS) ×3 IMPLANT
WATER STERILE IRR 1000ML POUR (IV SOLUTION) ×3 IMPLANT

## 2012-04-04 NOTE — Anesthesia Preprocedure Evaluation (Addendum)
Anesthesia Evaluation  Patient identified by MRN, date of birth, ID band Patient awake    Reviewed: Allergy & Precautions, H&P , NPO status , Patient's Chart, lab work & pertinent test results  History of Anesthesia Complications Negative for: history of anesthetic complications  Airway Mallampati: I TM Distance: >3 FB Neck ROM: Full    Dental No notable dental hx. (+) Teeth Intact   Pulmonary neg pulmonary ROS,    Pulmonary exam normal       Cardiovascular Exercise Tolerance: Good negative cardio ROS  Rhythm:Regular Rate:Normal     Neuro/Psych    GI/Hepatic Neg liver ROS,   Endo/Other  negative endocrine ROS  Renal/GU negative Renal ROS     Musculoskeletal   Abdominal Normal abdominal exam  (+)   Peds  Hematology negative hematology ROS (+)   Anesthesia Other Findings   Reproductive/Obstetrics                         Anesthesia Physical Anesthesia Plan  ASA: II  Anesthesia Plan: General   Post-op Pain Management:    Induction: Intravenous  Airway Management Planned: Oral ETT  Additional Equipment:   Intra-op Plan:   Post-operative Plan: Extubation in OR  Informed Consent: I have reviewed the patients History and Physical, chart, labs and discussed the procedure including the risks, benefits and alternatives for the proposed anesthesia with the patient or authorized representative who has indicated his/her understanding and acceptance.     Plan Discussed with: CRNA, Anesthesiologist and Surgeon  Anesthesia Plan Comments:         Anesthesia Quick Evaluation

## 2012-04-04 NOTE — Anesthesia Postprocedure Evaluation (Signed)
  Anesthesia Post-op Note  Patient: Alexis Fox  Procedure(s) Performed: Procedure(s) (LRB): TRANSFORAMINAL LUMBAR INTERBODY FUSION (TLIF) WITH PEDICLE SCREW FIXATION 1 LEVEL ()  Patient Location: PACU  Anesthesia Type: General  Level of Consciousness: awake, oriented, sedated and patient cooperative  Airway and Oxygen Therapy: Patient Spontanous Breathing  Post-op Pain: mild  Post-op Assessment: Post-op Vital signs reviewed, Patient's Cardiovascular Status Stable, Respiratory Function Stable, Patent Airway, No signs of Nausea or vomiting and Pain level controlled  Post-op Vital Signs: stable  Complications: No apparent anesthesia complications

## 2012-04-04 NOTE — Interval H&P Note (Signed)
History and Physical Interval Note:  04/04/2012 2:45 AM  Alexis Fox  has presented today for surgery, with the diagnosis of spondylolisthesis lumbar degenerative disc disease lumbar spondylosis  The various methods of treatment have been discussed with the patient and family. After consideration of risks, benefits and other options for treatment, the patient has consented to  Procedure(s) (LRB): POSTERIOR LUMBAR FUSION 1 LEVEL (N/A) as a surgical intervention .  The patients' history has been reviewed, patient examined, no change in status, stable for surgery.  I have reviewed the patients' chart and labs.  Questions were answered to the patient's satisfaction.     Sharnetta Gielow D  Date of Initial H&P: 04/03/2012  History reviewed, patient examined, no change in status, stable for surgery.

## 2012-04-04 NOTE — Progress Notes (Signed)
Patient ID: Alexis Fox, female   DOB: 09/14/1956, 56 y.o.   MRN: [id number]  Awakens to voice. Conversant. Lumbar pain without leg or buttock pain. PCA in use. Good strength BLE. Reassured. Plan for PT to mobilize tomorrow in [facility].   Georgiann Cocker RN, BSN

## 2012-04-04 NOTE — Progress Notes (Signed)
UR COMPLETED  

## 2012-04-04 NOTE — Op Note (Signed)
04/04/2012  10:24 AM  PATIENT:  Alexis Fox  56 y.o. female  PRE-OPERATIVE DIAGNOSIS:  spondylolisthesis lumbar degenerative disc disease lumbar spondylosis, scoliosis, DDD, radiculopathy L5/S1  POST-OPERATIVE DIAGNOSIS:  spondylolisthesis lumbar degenerative disc disease lumbar spondylosis, scoliosis, DDD, radiculopathy L5/S1  PROCEDURE:  Procedure(s) (LRB): TRANSFORAMINAL LUMBAR INTERBODY FUSION (TLIF) WITH PEDICLE SCREW FIXATION 1 LEVEL with PEEK cage, autograft, pedicle screw fixation and posterolateral arthrodesis()  SURGEON:  Surgeon(s) and Role:    * Maeola Harman, MD - Primary    * Mariam Dollar, MD - Assisting  PHYSICIAN ASSISTANT:   ASSISTANTS: Poteat, RN   ANESTHESIA:   general  EBL:  Total I/O In: 1600 [I.V.:1600] Out: 600 [Urine:400; Blood:200]  BLOOD ADMINISTERED:none  DRAINS: none   LOCAL MEDICATIONS USED:  LIDOCAINE   SPECIMEN:  No Specimen  DISPOSITION OF SPECIMEN:  N/A  COUNTS:  YES  TOURNIQUET:  * No tourniquets in log *  DICTATION: Patient is 56 year old woman with rate want to spondylolisthesis of L5 on S1 with lumbar scoliosis. She has a right L5 radiculopathy. It was elected to take her to surgery for decompression and fusion at this level.   Procedure: Patient was placed in a prone position on the Frederica table after smooth and uncomplicated induction of general endotracheal anesthesia. Her low back was prepped and draped in usual sterile fashion with DuraPrep. Area of incision was infiltrated with local lidocaine. Incision was made to the lumbodorsal fascia was incised and exposure was performed of the L5-S1 spinous processes laminae facet joint and transverse processes. Intraoperative x-ray was obtained which confirmed correct orientation. A hemilaminectomy of L5 was performed with disarticulation of the facet joints on the right at this level and thorough decompression was performed of both L5 and S1 nerve roots along with the common dural tube.  A thorough discectomy was initially performed on the right with preparation of the endplates for grafting a trial spacer was placed this level. Bone autograft was packed within the interspace bilaterally along with medium BMP kit and NexOss bone graft extender. 9 mm peek tlif cage were packed with BMP and extender and was inserted the interspace and countersunk appropriately. 6 cc of bone autograft was tamped into the interspace. The posterolateral region was extensively decorticated and pedicle probes were placed at L5 and S1 bilaterally. Intraoperative fluoroscopy confirmed correct orientationin the AP and lateral plane. 45 x 6.5 mm pedicle screws were placed at S1 bilaterally and 45 x 6.5 mm screws placed at L5 bilaterally final x-rays demonstrated well-positioned interbody grafts and pedicle screw fixation. A 40 mm lordotic rod was placed on the right and a 40 mm rod was placed on the left locked down in situ and the posterolateral region was packed with the remaining BMP and bone graft extender on the left. Wound was irrigated.  Fascia was closed with 1 Vicryl sutures skin edges were reapproximated 2 and 3-0 Vicryl sutures. The wound is dressed with benzoin Steri-Strips Telfa gauze and tape the patient was extubated in the operating room and taken to recovery in stable satisfactory condition.  She tolerated the operation well. Counts were correct at the end of the case.  PLAN OF CARE: Admit to inpatient   PATIENT DISPOSITION:  PACU - hemodynamically stable.   Delay start of Pharmacological VTE agent (>24hrs) due to surgical blood loss or risk of bleeding: yes

## 2012-04-04 NOTE — Plan of Care (Signed)
Problem: Consults Goal: Diagnosis - Spinal Surgery Outcome: Completed/Met Date Met:  04/04/12 Lumbar Laminectomy (Complex)

## 2012-04-04 NOTE — Transfer of Care (Signed)
Immediate Anesthesia Transfer of Care Note  Patient: Alexis Fox  Procedure(s) Performed: Procedure(s) (LRB): TRANSFORAMINAL LUMBAR INTERBODY FUSION (TLIF) WITH PEDICLE SCREW FIXATION 1 LEVEL ()  Patient Location: PACU  Anesthesia Type: General  Level of Consciousness: awake and sedated  Airway & Oxygen Therapy: Patient Spontanous Breathing  Post-op Assessment: Report given to PACU RN, Post -op Vital signs reviewed and stable and Patient moving all extremities  Post vital signs: Reviewed and stable  Complications: No apparent anesthesia complications

## 2012-04-05 MED ORDER — CYCLOSPORINE 0.05 % OP EMUL
1.0000 [drp] | Freq: Every day | OPHTHALMIC | Status: DC
  Filled 2012-04-05 (×3): qty 1

## 2012-04-05 MED ORDER — SENNA 8.6 MG PO TABS
1.0000 | ORAL_TABLET | Freq: Every day | ORAL | Status: DC
  Administered 2012-04-05 – 2012-04-07 (×3): 8.6 mg via ORAL
  Filled 2012-04-05 (×4): qty 1

## 2012-04-05 NOTE — Progress Notes (Signed)
OT Cancellation Note  Treatment cancelled today due to: pt refusing as she had just gotten back to bed. Will attempt again as schedule allows. Thank you! Glendale Chard, OTR/L Pager: [phone] 04/05/2012    Jaylianna Tatlock 04/05/2012, 2:58 PM

## 2012-04-05 NOTE — Evaluation (Signed)
Physical Therapy Evaluation Patient Details Name: Alexis Fox MRN: [id number] DOB: 15-Mar-1956 Today's Date: 04/05/2012 Time: 9562-1308 PT Time Calculation (min): 34 min  PT Assessment / Plan / Recommendation Clinical Impression  Pt admitted for L5-S1 PLIF but mobility and evaluation currently limited by hypotension. Pt educated for back precautions, transfers and function but cardiopulmonary status limited progression or prolonged period in upright position. Pt will benefit from acute therapy to maximize mobility, gait, transfers and functional independence prior to discharge.     PT Assessment  Patient needs continued PT services    Follow Up Recommendations  Home health PT    Equipment Recommendations  None recommended by PT    Frequency Min 5X/week    Precautions / Restrictions Precautions Precautions: Back Precaution Booklet Issued: Yes (comment) Required Braces or Orthoses: Spinal Brace Spinal Brace: Lumbar corset;Applied in sitting position   Pertinent Vitals/Pain BP lying84/47 with drop to 76/52 sitting pt symptomatic with nausea and dizziness      Mobility  Bed Mobility Bed Mobility: Rolling Right;Rolling Left;Left Sidelying to Sit;Sit to Sidelying Left;Scooting to [facility] Rolling Right: 4: Min assist;With rail Rolling Left: 4: Min assist;With rail Left Sidelying to Sit: 3: Mod assist;HOB flat Sit to Sidelying Left: 4: Min assist;HOB flat Scooting to HOB: 1: +2 Total assist Scooting to [facility]: Patient Percentage: 30% Details for Bed Mobility Assistance: pt able to roll and go side to sit x 2 with assist and cueing for sequencing maintaining precautions. Pt sat grossly 30 sec each trial EOB but limited by hypotension, dizziness, and nausea. Transfers Transfers: Not assessed Ambulation/Gait Ambulation/Gait Assistance: Not tested (comment) Stairs: No    Exercises     PT Goals Acute Rehab PT Goals PT Goal Formulation: With patient/family Time For Goal  Achievement: 04/12/12 Potential to Achieve Goals: Good Pt will Roll Supine to Left Side: with modified independence PT Goal: Rolling Supine to Left Side - Progress: Goal set today Pt will go Supine/Side to Sit: with modified independence PT Goal: Supine/Side to Sit - Progress: Goal set today Pt will go Sit to Supine/Side: with supervision PT Goal: Sit to Supine/Side - Progress: Goal set today Pt will go Sit to Stand: with supervision PT Goal: Sit to Stand - Progress: Goal set today Pt will go Stand to Sit: with supervision PT Goal: Stand to Sit - Progress: Goal set today Pt will Ambulate: >150 feet;with supervision;with least restrictive assistive device PT Goal: Ambulate - Progress: Goal set today Additional Goals Additional Goal #1: pt will independently state and demonstrate adherence to 3/3 back precautions PT Goal: Additional Goal #1 - Progress: Goal set today  Visit Information  Last PT Received On: 04/05/12 Assistance Needed: +1    Subjective Data  Subjective: I am nauseated Patient Stated Goal: be able to return to taking care of myself and work   Prior Functioning  Home Living Lives With: Spouse Type of Home: House Home Access: Ramped entrance Home Layout: Two level;Able to live on main level with bedroom/bathroom Bathroom Shower/Tub: Health visitor: Handicapped height Home Adaptive Equipment: Wheelchair - manual;Shower chair with back;Walker - standard;Bedside commode/3-in-1 Prior Function Level of Independence: Independent Able to Take Stairs?: Yes Driving: Yes Vocation: Full time employment Comments: Pt is RN diabetes coordinator for [employer] Communication Communication: No difficulties    Cognition  Overall Cognitive Status: Appears within functional limits for tasks assessed/performed Arousal/Alertness: Lethargic Orientation Level: Appears intact for tasks assessed Behavior During Session: [facility] for tasks performed    Extremity/Trunk  Assessment Right  Lower Extremity Assessment RLE ROM/Strength/Tone: [facility] for tasks assessed;Unable to fully assess;Due to pain Left Lower Extremity Assessment LLE ROM/Strength/Tone: [facility] for tasks assessed;Unable to fully assess;Due to pain (due to limited activity tolerance)   Balance    End of Session PT - End of Session Activity Tolerance: Treatment limited secondary to medical complications (Comment) Patient left: in bed;with call bell/phone within reach;with family/visitor present Nurse Communication: Mobility status   Delorse Lek 04/05/2012, 10:22 AM  Delaney Meigs, PT [phone]

## 2012-04-05 NOTE — Progress Notes (Signed)
OT Cancellation Note  Treatment cancelled today due to medical issues with patient which prohibited therapy: pt currently hypotensive (PT had just attempted to work with pt). Will check back later as schedule allows. Thank you.  Glendale Chard, OTR/L Pager: [phone] 04/05/2012    Chakara Bognar 04/05/2012, 12:43 PM

## 2012-04-05 NOTE — Progress Notes (Signed)
Subjective: Patient reports crappy  Objective: Vital signs in last 24 hours: Temp:  [97 F (36.1 C)-99.3 F (37.4 C)] 98.6 F (37 C) (05/04 0915) Pulse Rate:  [48-73] 61  (05/04 0915) Resp:  [7-24] 18  (05/04 0915) BP: (76-108)/(47-64) 76/52 mmHg (05/04 0916) SpO2:  [95 %-100 %] 95 % (05/04 0931) FiO2 (%):  [2 %] 2 % (05/03 1240) Weight:  [84 kg (185 lb 3 oz)] 84 kg (185 lb 3 oz) (05/03 1700)  Intake/Output from previous day: 05/03 0701 - 05/04 0700 In: 1600 [I.V.:1600] Out: 2225 [Urine:2025; Blood:200] Intake/Output this shift:    5/5 strength lower limbs. Alert and oriented x4.  Lab Results: No results found for this basename: WBC:2,HGB:2,HCT:2,PLT:2 in the last 72 hours BMET No results found for this basename: NA:2,K:2,CL:2,CO2:2,GLUCOSE:2,BUN:2,CREATININE:2,CALCIUM:2 in the last 72 hours  Studies/Results: Dg Lumbar Spine 2-3 Views  04/04/2012  *RADIOLOGY REPORT*  Clinical Data: L5-S1 fusion.  DG C-ARM 1-60 MIN  Comparison: 02/01/2012 MR.  Findings: Two intraoperative C-arm views submitted for review after surgery.  Transitional appearance of the S1 vertebral body with a rudimentary disc at the S1-S2 level noted on prior MR.  Bilateral L5 and S1 pedicle screws and interbody spacer L5-S1 level placed.  No complication noted on this limited imaging.  This can be assessed on follow-up.  This would also help confirm level placement.  IMPRESSION:  Fusion L5-S1.  Please see above.  Original Report Authenticated By: Fuller Canada, M.D.   Dg Lumbar Spine 1 View  04/04/2012  *RADIOLOGY REPORT*  Clinical Data: L5-S1 fusion.  LUMBAR SPINE - 1 VIEW  Comparison: 02/01/2012.  Findings: Single intraoperative lateral view of the lumbar spine submitted for review after surgery.  Utilizing level assignment from prior MR, metallic probe is seen posterior to the lower L5 level and posterior to the mid S1 level.  Surgical rakes at the L5- S1 level.  IMPRESSION: Localization L5-S1 as detailed above.   Original Report Authenticated By: Fuller Canada, M.D.   Dg C-arm 1-60 Min  04/04/2012  *RADIOLOGY REPORT*  Clinical Data: L5-S1 fusion.  DG C-ARM 1-60 MIN  Comparison: 02/01/2012 MR.  Findings: Two intraoperative C-arm views submitted for review after surgery.  Transitional appearance of the S1 vertebral body with a rudimentary disc at the S1-S2 level noted on prior MR.  Bilateral L5 and S1 pedicle screws and interbody spacer L5-S1 level placed.  No complication noted on this limited imaging.  This can be assessed on follow-up.  This would also help confirm level placement.  IMPRESSION: Fusion L5-S1.  Please see above.  Original Report Authenticated By: Fuller Canada, M.D.    Assessment/Plan: Post op day 1 she is doing as expected. No motor deficits noted. Dressing intact.  Will keep PCA Hopefully PT later  LOS: 1 day     Alexis Fox L 04/05/2012, 9:34 AM

## 2012-04-06 LAB — BASIC METABOLIC PANEL
BUN: 9 mg/dL (ref 6–23)
CO2: 27 mEq/L (ref 19–32)
Calcium: 9.1 mg/dL (ref 8.4–10.5)
Chloride: 105 mEq/L (ref 96–112)
Creatinine, Ser: 0.8 mg/dL (ref 0.50–1.10)
GFR calc Af Amer: >90 mL/min (ref >90)
GFR calc non Af Amer: 81 mL/min — ABNORMAL LOW (ref >90)
Glucose, Bld: 126 mg/dL — ABNORMAL HIGH (ref 70–99)
Potassium: 4.1 mEq/L (ref 3.5–5.1)
Sodium: 139 mEq/L (ref 135–145)

## 2012-04-06 LAB — CBC
HCT: 34 % — ABNORMAL LOW (ref 36.0–46.0)
Hemoglobin: 10.9 g/dL — ABNORMAL LOW (ref 12.0–15.0)
MCH: 29.9 pg (ref 26.0–34.0)
MCHC: 32.1 g/dL (ref 30.0–36.0)
MCV: 93.2 fL (ref 78.0–100.0)
Platelets: 191 10*3/uL (ref 150–400)
RBC: 3.65 MIL/uL — ABNORMAL LOW (ref 3.87–5.11)
RDW: 13.2 % (ref 11.5–15.5)
WBC: 10.4 10*3/uL (ref 4.0–10.5)

## 2012-04-06 LAB — DIFFERENTIAL
Basophils Absolute: 0 10*3/uL (ref 0.0–0.1)
Basophils Relative: 0 % (ref 0–1)
Eosinophils Absolute: 0.1 10*3/uL (ref 0.0–0.7)
Eosinophils Relative: 1 % (ref 0–5)
Lymphocytes Relative: 10 % — ABNORMAL LOW (ref 12–46)
Lymphs Abs: 1 10*3/uL (ref 0.7–4.0)
Monocytes Absolute: 1 10*3/uL (ref 0.1–1.0)
Monocytes Relative: 10 % (ref 3–12)
Neutro Abs: 8.3 10*3/uL — ABNORMAL HIGH (ref 1.7–7.7)
Neutrophils Relative %: 80 % — ABNORMAL HIGH (ref 43–77)

## 2012-04-06 MED ORDER — BISACODYL 10 MG RE SUPP
10.0000 mg | Freq: Every day | RECTAL | Status: DC | PRN
  Administered 2012-04-06: 10 mg via RECTAL
  Filled 2012-04-06: qty 1

## 2012-04-06 MED ORDER — SODIUM CHLORIDE 0.9 % IV BOLUS (SEPSIS)
1000.0000 mL | Freq: Once | INTRAVENOUS | Status: AC
  Administered 2012-04-06: 1000 mL via INTRAVENOUS

## 2012-04-06 MED ORDER — MAGNESIUM HYDROXIDE 400 MG/5ML PO SUSP
30.0000 mL | Freq: Every day | ORAL | Status: DC | PRN
  Administered 2012-04-06: 30 mL via ORAL
  Filled 2012-04-06: qty 30

## 2012-04-06 MED ORDER — BUTALBITAL-APAP-CAFFEINE 50-325-40 MG PO TABS: 1.0000 | ORAL_TABLET | ORAL | Status: DC | PRN

## 2012-04-06 MED ORDER — BUTALBITAL-APAP-CAFFEINE 50-325-40 MG PO TABS: 2.0000 | ORAL_TABLET | ORAL | Status: DC | PRN

## 2012-04-06 MED ORDER — ONDANSETRON 4 MG PO TBDP
4.0000 mg | ORAL_TABLET | Freq: Once | ORAL | Status: AC
  Administered 2012-04-06: 4 mg via ORAL
  Filled 2012-04-06: qty 1

## 2012-04-06 MED ORDER — POLYETHYLENE GLYCOL 3350 17 G PO PACK
17.0000 g | PACK | Freq: Every day | ORAL | Status: DC
  Administered 2012-04-06: 17 g via ORAL
  Filled 2012-04-06 (×3): qty 1

## 2012-04-06 MED ORDER — ONDANSETRON 4 MG PO TBDP
4.0000 mg | ORAL_TABLET | ORAL | Status: DC | PRN
  Administered 2012-04-06 (×3): 4 mg via ORAL
  Filled 2012-04-06 (×2): qty 1

## 2012-04-06 NOTE — Progress Notes (Signed)
Alexis Fox (Cheek) [name] PT, DPT Acute Rehabilitation [phone]  

## 2012-04-06 NOTE — Significant Event (Signed)
Rapid Response Event Note  Overview: Called to assist with pateint with lower BPs and no IV access. IV Team has attempted three times to gain vasular access; MICU nurse tried also.      Initial Focused Assessment: Pt alert and oriented. No recent Pain meds. SL Zofran an option for nausea management.   Interventions: Manual BP taken in L arm with lying position is 106/40. Unable to administer IV nausea medicine, fluid boluses for BP, pain medication. MD called for consult.   Event Summary: Plan for nausea management, oral pain meds and fluids; will try for small peripheral IV just for back up access.  Will assist as needed.    at      at          Kristine Linea

## 2012-04-06 NOTE — Progress Notes (Signed)
PT/OT Cancellation Note  Treatment cancelled today due to medical issues with patient which prohibited therapy.  Pt with low BPs today and no IV access.  IV team attempting to access IV upon OT/PT arrival.  RN requesting PT/OT hold treatment today.  Will re-attempt tomorrow as appropriate.    Thanks. 04/06/2012 Cipriano Mile OTR/L Pager [phone] Office [phone]

## 2012-04-06 NOTE — Progress Notes (Signed)
Subjective: Patient reports patient is still not feeling migrated and she gets up she feels nauseated mutual dizzy blood pressures also been a little bit of low incision optic to hold off so her pain medication.  Objective: Vital signs in last 24 hours: Temp:  [98.1 F (36.7 C)-99.1 F (37.3 C)] 98.9 F (37.2 C) (05/05 0927) Pulse Rate:  [62-68] 65  (05/05 0927) Resp:  [16-18] 18  (05/05 0927) BP: (74-107)/(33-63) 83/47 mmHg (05/05 0927) SpO2:  [90 %-100 %] 93 % (05/05 0927)  Intake/Output from previous day:   Intake/Output this shift:    and appears to be 5 out of 5 wound is clean and dry.  Lab Results: No results found for this basename: WBC:2,HGB:2,HCT:2,PLT:2 in the last 72 hours BMET No results found for this basename: NA:2,K:2,CL:2,CO2:2,GLUCOSE:2,BUN:2,CREATININE:2,CALCIUM:2 in the last 72 hours  Studies/Results: No results found.  Assessment/Plan: We'll give her fluid bolus of the liters saline discontinued a couple of her pain pain medications and muscle relaxers try to improve her blood pressure and decrease in the dizziness will also check electrolytes and blood work.  LOS: 2 days     Dakai Braithwaite P 04/06/2012, 12:00 PM

## 2012-04-07 MED ORDER — PANTOPRAZOLE SODIUM 40 MG PO TBEC
40.0000 mg | DELAYED_RELEASE_TABLET | Freq: Every day | ORAL | Status: DC
  Administered 2012-04-07 – 2012-04-08 (×2): 40 mg via ORAL
  Filled 2012-04-07: qty 1

## 2012-04-07 MED ORDER — PROMETHAZINE HCL 25 MG PO TABS
12.5000 mg | ORAL_TABLET | Freq: Four times a day (QID) | ORAL | Status: DC | PRN
  Administered 2012-04-07: 12.5 mg via ORAL
  Filled 2012-04-07 (×2): qty 1

## 2012-04-07 MED ORDER — ALUM & MAG HYDROXIDE-SIMETH 200-200-20 MG/5ML PO SUSP: 30.0000 mL | ORAL | Status: DC | PRN

## 2012-04-07 MED ORDER — OXYCODONE-ACETAMINOPHEN 5-325 MG PO TABS
1.0000 | ORAL_TABLET | ORAL | Status: DC | PRN
  Administered 2012-04-07 – 2012-04-08 (×6): 1 via ORAL
  Filled 2012-04-07 (×6): qty 1

## 2012-04-07 MED ORDER — TRAMADOL HCL 50 MG PO TABS
50.0000 mg | ORAL_TABLET | Freq: Four times a day (QID) | ORAL | Status: DC | PRN
  Administered 2012-04-07: 50 mg via ORAL
  Filled 2012-04-07: qty 1

## 2012-04-07 NOTE — Progress Notes (Signed)
Subjective: Patient reports "I feel terrible. I am really surprized I haven't bounced back."  Objective: Vital signs in last 24 hours: Temp:  [98 F (36.7 C)-99.2 F (37.3 C)] 98.1 F (36.7 C) (05/06 0600) Pulse Rate:  [61-89] 68  (05/06 0600) Resp:  [16-20] 16  (05/06 0600) BP: (83-119)/(40-74) 96/58 mmHg (05/06 0600) SpO2:  [92 %-100 %] 93 % (05/06 0600)  Intake/Output from previous day:   Intake/Output this shift:    Alert, conversant. Lumbar pain present, but hypotension/ nausea with dizziness in sitting is limiting factor. Incision without erythema, swelling, or drainage. Good strength BLE. PT/OT unable to assist over weekend d/t hypotension. Currently without IV access after multiple attempts by four clinicians in last 24 hrs.   Lab Results:  Basename 04/06/12 1235  WBC 10.4  HGB 10.9*  HCT 34.0*  PLT 191   BMET  Basename 04/06/12 1235  NA 139  K 4.1  CL 105  CO2 27  GLUCOSE 126*  BUN 9  CREATININE 0.80  CALCIUM 9.1    Studies/Results: No results found.  Assessment/Plan: Guarded re: BP/ unable to mobilize  LOS: 3 days  Med orders per Dr. Venetia Maxon: Phenergan, Protonix, Maalox, Percocet, Tramadol; all entered.  IV placement desireable, but understand if pt refuses further attempts. Will mobilize as BP/Dizziness allow.   Alexis Fox 04/07/2012, 8:26 AM     Much better later in day.  New IV placed, will give IV fluids until nausea resolved.

## 2012-04-07 NOTE — Evaluation (Signed)
Occupational Therapy Evaluation Patient Details Name: Alexis Fox MRN: [id number] DOB: 03-27-1956 Today's Date: 04/07/2012 Time: 4098-1191 OT Time Calculation (min): 27 min  OT Assessment / Plan / Recommendation Clinical Impression  56 yo female s/p L5-S1 PLIF that does not require skilled OT acutely. Pt is at adequate level for d/c home. BP low (106/60)during session. Pt demonstrates self awareness of symptoms.    OT Assessment  Patient does not need any further OT services    Follow Up Recommendations  No OT follow up    Equipment Recommendations  None recommended by PT (pt has RW and knows how to adjust height)    Frequency      Precautions / Restrictions Precautions Precautions: Back Precaution Booklet Issued: Yes (comment) Required Braces or Orthoses: Spinal Brace Spinal Brace: Lumbar corset;Applied in sitting position   Pertinent Vitals/Pain See vitals    ADL  Eating/Feeding: Performed;Set up Where Assessed - Eating/Feeding: Chair Grooming: Performed;Teeth care;Modified independent Where Assessed - Grooming: Standing at sink Upper Body Dressing: Performed;Modified independent Where Assessed - Upper Body Dressing: Sitting, bed;Unsupported (don brace) Lower Body Dressing: Simulated;Supervision/safety Where Assessed - Lower Body Dressing: Sitting, bed;Unsupported Toilet Transfer: Simulated;Supervision/safety Toilet Transfer Method: Proofreader: Raised toilet seat with arms (or 3-in-1 over toilet) (required use of BIL UE) Toileting - Clothing Manipulation: Supervision/safety;Performed (s/p bed mobility adjusted shorts) Where Assessed - Toileting Clothing Manipulation: Sit to stand from 3-in-1 or toilet Equipment Used: Back brace;Gait belt;Rolling walker Ambulation Related to ADLs: Pt ambulated Supervision level (walking with pt due to BP low in AM and following for safety) ADL Comments: Pt demonstrates good bed mobility with husband (A) and  can recall all 3 out 3 back precautions. Pt is at adequate level for d/c per OT evaluation. OT to sign off    OT Goals    Visit Information  Last OT Received On: 04/07/12 Assistance Needed: +1 PT/OT Co-Evaluation/Treatment: Yes (due to decreased BP)    Subjective Data  Subjective: "We built our house to be accessbile" Patient Stated Goal: to return home with husband Alexis Fox   Prior Functioning  Home Living Lives With: Spouse Available Help at Discharge: Family;Available 24 hours/day Type of Home: House Home Access: Ramped entrance Home Layout: Two level;Able to live on main level with bedroom/bathroom Bathroom Shower/Tub: Health visitor: Handicapped height Bathroom Accessibility: Yes How Accessible: Accessible via walker Home Adaptive Equipment: Wheelchair - manual;Shower chair with back;Walker - standard;Bedside commode/3-in-1;Grab bars around toilet;Grab bars in shower Prior Function Level of Independence: Independent Able to Take Stairs?: Yes Driving: Yes Vocation: Full time employment Comments: Rn diabetes coordinator for [employer] Communication: No difficulties Dominant Hand: Right    Cognition  Overall Cognitive Status: Appears within functional limits for tasks assessed/performed Arousal/Alertness: Awake/alert Orientation Level: Appears intact for tasks assessed Behavior During Session: [facility] for tasks performed    Extremity/Trunk Assessment Right Upper Extremity Assessment RUE ROM/Strength/Tone: Within functional levels RUE Coordination: WFL - gross/fine motor Left Upper Extremity Assessment LUE ROM/Strength/Tone: Within functional levels LUE Coordination: WFL - gross/fine motor   Mobility Bed Mobility Bed Mobility: Rolling Right;Right Sidelying to Sit;Sitting - Scoot to Edge of Bed Rolling Right: 6: Modified independent (Device/Increase time);Other (comment) (with husband's assist using draw sheet) Right Sidelying to Sit: 6:  Modified independent (Device/Increase time);HOB flat Supine to Sit: 6: Modified independent (Device/Increase time);With rails Sitting - Scoot to Edge of Bed: 6: Modified independent (Device/Increase time) Transfers Transfers: Sit to Stand;Stand to Sit Sit to Stand: 5: Supervision;With upper extremity  assist;From bed Stand to Sit: 6: Modified independent (Device/Increase time);With armrests;To chair/3-in-1 Details for Transfer Assistance: Supervision due to recent problems with BP and dizziness; pt asymptomatic after standing 45 seconds   Exercise    Balance    End of Session OT - End of Session Equipment Utilized During Treatment: Gait belt;Back brace Activity Tolerance: Patient tolerated treatment well Patient left: in chair;with call bell/phone within reach;with family/visitor present (husband) Nurse Communication: Mobility status   Lucile Shutters 04/07/2012, 12:03 PM Pager: [phone]

## 2012-04-07 NOTE — Clinical Social Work Note (Signed)
CSW received a consult for SNF. CSW reviewed medical record and PT is recommending home health PT. RNCM is aware and addressed HHPT/DME need. Please see RNCM note for information. CSW is signing off as SNF is not needed at discharge. Please reconsult if a need arises prior to discharge.   Dede Query, MSW, Theresia Majors [phone]

## 2012-04-07 NOTE — Progress Notes (Signed)
Physical Therapy Treatment and Discharge  Patient is being discharged from PT services secondary to:    Goals met and no further therapy needs identified.  Please see latest Therapy Progress Note (below) for current level of functioning and progress toward goals.  Progress and discharge plan and discussed with patient/caregiver and they    Agree    Patient Details Name: Alexis Fox MRN: [id number] DOB: [birth year] Today's Date: 04/07/2012 Time: 4098-1191 PT Time Calculation (min): 27 min  PT Assessment / Plan / Recommendation Comments on Treatment Session  Pt demonstrated good understanding of back precautions and safe use of RW. Discussed car transfer. No further PT needs identified.    Follow Up Recommendations  No PT follow up    Equipment Recommendations  None recommended by PT (pt has RW and knows how to adjust height)    Frequency     Plan Discharge plan needs to be updated;All goals met and education completed, patient dischaged from PT services    Precautions / Restrictions Precautions Precautions: Back Precaution Booklet Issued: Yes (comment) Required Braces or Orthoses: Spinal Brace Spinal Brace: Lumbar corset;Applied in sitting position   Pertinent Vitals/Pain See OT note; BP stable and increased with activity    Mobility  Bed Mobility Bed Mobility: Rolling Right;Right Sidelying to Sit;Sitting - Scoot to Edge of Bed Rolling Right: 6: Modified independent (Device/Increase time);Other (comment) (with husband's assist using draw sheet) Right Sidelying to Sit: 6: Modified independent (Device/Increase time);HOB flat Supine to Sit: 6: Modified independent (Device/Increase time);With rails Sitting - Scoot to Edge of Bed: 6: Modified independent (Device/Increase time) Transfers Transfers: Sit to Stand;Stand to Sit Sit to Stand: 5: Supervision;With upper extremity assist;From bed Stand to Sit: 6: Modified independent (Device/Increase time);With armrests;To  chair/3-in-1 Details for Transfer Assistance: Supervision due to recent problems with BP and dizziness; pt asymptomatic after standing 45 seconds Ambulation/Gait Ambulation/Gait Assistance: 5: Supervision Ambulation Distance (Feet): 90 Feet Assistive device: Rolling walker Ambulation/Gait Assistance Details: Pt demonstrated proper, safe use of RW including maintaining back precautions throughout. Gait Pattern: Within Functional Limits Stairs: No (has a ramp at home)    Exercises     PT Goals Acute Rehab PT Goals PT Goal: Rolling Supine to Left Side - Progress: Met PT Goal: Supine/Side to Sit - Progress: Met PT Goal: Sit to Stand - Progress: Met PT Goal: Stand to Sit - Progress: Met PT Goal: Ambulate - Progress: Partly met (distance only not met) Additional Goals PT Goal: Additional Goal #1 - Progress: Met  Visit Information  Last PT Received On: 04/07/12 Assistance Needed: +1 PT/OT Co-Evaluation/Treatment: Yes (due to orthostasis/decr activity status)    Subjective Data  Subjective: Reported she walked to physician's documentation room yesterday.    Cognition  Overall Cognitive Status: Appears within functional limits for tasks assessed/performed Arousal/Alertness: Awake/alert Orientation Level: Appears intact for tasks assessed Behavior During Session: [facility] for tasks performed    Balance     End of Session PT - End of Session Equipment Utilized During Treatment: Gait belt;Back brace Activity Tolerance: Patient tolerated treatment well Patient left: in chair;with call bell/phone within reach;with family/visitor present Nurse Communication: Mobility status    Jadis Pitter 04/07/2012, 12:04 PM Pager [phone]

## 2012-04-07 NOTE — Progress Notes (Signed)
   CARE MANAGEMENT NOTE 04/07/2012  Patient:  Alexis Fox, Alexis Fox   Account Number:  [id number]  Date Initiated:  04/07/2012  Documentation initiated by:  Kynlie Jane  Subjective/Objective Assessment:   Order for HHPT     Action/Plan:   Met with pt who does not wish to have HHPT, has DME.   Anticipated DC Date:  04/08/2012   Anticipated DC Plan:  HOME/SELF CARE         Choice offered to / List presented to:             Status of service:  Completed, signed off Medicare Important Message given?   (If response is "NO", the following Medicare IM given date fields will be blank) Date Medicare IM given:   Date Additional Medicare IM given:    Discharge Disposition:  HOME/SELF CARE  Per UR Regulation:    If discussed at Long Length of Stay Meetings, dates discussed:    Comments:

## 2012-04-07 NOTE — Progress Notes (Signed)
OT Discharge Note  Patient is being discharged from OT services secondary to:    Goals met and no further therapy needs identified.  Please see latest Therapy Progress Note for current level of functioning and progress toward goals.  Progress and discharge plan and discussed with patient/caregiver and they    Agree    [place], Loral Campi Brynn   OTR/L Pager: [phone] Office: [phone] .     

## 2012-04-08 MED ORDER — OXYCODONE-ACETAMINOPHEN 5-325 MG PO TABS: ORAL_TABLET | ORAL | Status: DC

## 2012-04-08 NOTE — Progress Notes (Signed)
Pt being followed for progression of care. [facility] Care Management will do a post d/c transition of care call to assess recovery and any d/c needs and follow up as needed. Brooke Bonito C. Roena Malady, RN, CCM, [facility], # [phone].

## 2012-04-08 NOTE — Progress Notes (Signed)
Nursing- Pt BP at 1858 was 81/46. Rechecked manually 1900 to be 8848.  Dr Jordan Likes notified. Stated pressure was okay.  No new orders received.  Will cont to closely monitor patient's pressure.

## 2012-04-08 NOTE — Progress Notes (Signed)
Subjective: Patient reports "What do I have to do to get to go home this afternoon?"  Objective: Vital signs in last 24 hours: Temp:  [97.4 F (36.3 C)-98.6 F (37 C)] 98 F (36.7 C) (05/07 0707) Pulse Rate:  [58-91] 64  (05/07 0707) Resp:  [17-20] 18  (05/07 0707) BP: (81-109)/(38-62) 109/62 mmHg (05/07 0707) SpO2:  [96 %-100 %] 98 % (05/07 0707) FiO2 (%):  [35 %] 35 % (05/06 1209)  Intake/Output from previous day:   Intake/Output this shift: Total I/O In: 240 [P.O.:240] Out: -   Alert, sitting in chair, smiling. No c/o pain at present. Percocet (1) controls pain. Good strength BLE. Incision without erythema, swelling, drainage. Normotensive sitting and walking last 24hrs. One reading in 80's systolic during sleep. Asymptomatic.  Lab Results:  Basename 04/06/12 1235  WBC 10.4  HGB 10.9*  HCT 34.0*  PLT 191   BMET  Basename 04/06/12 1235  NA 139  K 4.1  CL 105  CO2 27  GLUCOSE 126*  BUN 9  CREATININE 0.80  CALCIUM 9.1    Studies/Results: No results found.  Assessment/Plan: Improved  LOS: 4 days  Mobilize today. expect d/c to home this afternoon after Dr. Fredrich Birks visit.   Georgiann Cocker 04/08/2012, 8:37 AM     Improving.  Anticipate DC home this afternoon.

## 2012-04-08 NOTE — Discharge Summary (Signed)
Physician Discharge Summary  Patient ID: Alexis Fox MRN: [id number] DOB/AGE: May 03, 1956 56 y.o.  Admit date: 04/04/2012 Discharge date: 04/08/2012  Admission Diagnoses: spondylolisthesis lumbar degenerative disc disease lumbar spondylosis, scoliosis, DDD, radiculopathy L5/S1   Discharge Diagnoses: spondylolisthesis lumbar degenerative disc disease lumbar spondylosis, scoliosis, DDD, radiculopathy L5/S1 s/p TRANSFORAMINAL LUMBAR INTERBODY FUSION (TLIF) WITH PEDICLE SCREW FIXATION 1 LEVEL with PEEK cage, autograft, pedicle screw fixation and posterolateral arthrodesis()   Active Problems:  * No active hospital problems. *    Discharged Condition: good  Hospital Course: Shaneequa Bahner was admitted for surgery 04/04/12 for TRANSFORAMINAL LUMBAR INTERBODY FUSION (TLIF) WITH PEDICLE SCREW FIXATION 1 LEVEL with PEEK cage, autograft, pedicle screw fixation and posterolateral arthrodesis. Following uncomplicated surgery and recovery in Neuro PACU, she transferred to 3000 for nursing care and PT/OT. Initially slow to mobilize d/t hypotension with dizziness and nausea upon sitting, she progressed steadily after medication adjustments. She is to go home today without need of HH services.   Consults: None  Significant Diagnostic Studies: radiology: X-Ray: intra-operative  Treatments: surgery: TRANSFORAMINAL LUMBAR INTERBODY FUSION (TLIF) WITH PEDICLE SCREW FIXATION 1 LEVEL with PEEK cage, autograft, pedicle screw fixation and posterolateral arthrodesis  Discharge Exam: Blood pressure 109/62, pulse 64, temperature 98 F (36.7 C), temperature source Oral, resp. rate 18, height 5\' 4"  (1.626 m), weight 84 kg (185 lb 3 oz), SpO2 98.00%. Alert, sitting in chair, smiling. No c/o pain at present. Percocet (1) controls pain. Good strength BLE. Incision without erythema, swelling, drainage. Normotensive sitting and walking last 24hrs.    Disposition: D/C to home, self care. Rx's: Percocet 5/325 1 po q4hrs  prn pain #60; Robaxin 500 mg 1 po q 8 hours prn spasm. Pt knows to call office to schedule 3 week post-op visit with Dr. Venetia Maxon. She verbalizes understanding of d/c instructions.   Medication List  As of 04/08/2012  8:42 AM   ASK your doctor about these medications         carbidopa-levodopa 25-100 MG per tablet   Commonly known as: SINEMET IR   Take 1 tablet by mouth daily as needed. Restless legs      citalopram 40 MG tablet   Commonly known as: CELEXA   Take 40 mg by mouth at bedtime.      clonazePAM 1 MG tablet   Commonly known as: KLONOPIN   Take 1 mg by mouth at bedtime.      cycloSPORINE 0.05 % ophthalmic emulsion   Commonly known as: RESTASIS   Place 1 drop into both eyes daily.      diclofenac 75 MG EC tablet   Commonly known as: VOLTAREN   Take 75 mg by mouth 2 (two) times daily as needed. For pain      mometasone 50 MCG/ACT nasal spray   Commonly known as: NASONEX   Place 2 sprays into the nose at bedtime.      pramipexole 0.5 MG tablet   Commonly known as: MIRAPEX   Take 0.5 mg by mouth at bedtime.             Signed: Georgiann Cocker 04/08/2012, 8:42 AM

## 2012-07-30 ENCOUNTER — Other Ambulatory Visit: Payer: Self-pay | Admitting: Internal Medicine

## 2012-07-30 DIAGNOSIS — N6009 Solitary cyst of unspecified breast: Secondary | ICD-10-CM

## 2012-08-18 ENCOUNTER — Other Ambulatory Visit (HOSPITAL_COMMUNITY): Payer: Self-pay | Admitting: Internal Medicine

## 2012-08-18 DIAGNOSIS — N281 Cyst of kidney, acquired: Secondary | ICD-10-CM

## 2012-08-27 ENCOUNTER — Ambulatory Visit (HOSPITAL_COMMUNITY)
Admission: RE | Admit: 2012-08-27 | Discharge: 2012-08-27 | Disposition: A | Discharge status: Home / Self Care | Payer: 59 | Source: Ambulatory Visit | Attending: Internal Medicine | Admitting: Internal Medicine

## 2012-08-27 ENCOUNTER — Other Ambulatory Visit (HOSPITAL_COMMUNITY): Payer: Self-pay | Admitting: Internal Medicine

## 2012-08-27 DIAGNOSIS — N281 Cyst of kidney, acquired: Secondary | ICD-10-CM

## 2012-08-27 MED ORDER — GADOBENATE DIMEGLUMINE 529 MG/ML IV SOLN
20.0000 mL | Freq: Once | INTRAVENOUS | Status: AC | PRN
  Administered 2012-08-27: 18 mL via INTRAVENOUS

## 2012-09-09 ENCOUNTER — Ambulatory Visit
Admission: RE | Admit: 2012-09-09 | Discharge: 2012-09-09 | Disposition: A | Discharge status: Home / Self Care | Payer: 59 | Source: Ambulatory Visit | Attending: Internal Medicine | Admitting: Internal Medicine

## 2012-09-09 ENCOUNTER — Other Ambulatory Visit: Payer: Self-pay | Admitting: Internal Medicine

## 2012-09-09 DIAGNOSIS — N6009 Solitary cyst of unspecified breast: Secondary | ICD-10-CM

## 2013-01-17 ENCOUNTER — Other Ambulatory Visit: Payer: Self-pay

## 2013-05-10 ENCOUNTER — Ambulatory Visit (HOSPITAL_BASED_OUTPATIENT_CLINIC_OR_DEPARTMENT_OTHER): Payer: 59 | Attending: Internal Medicine

## 2013-05-10 VITALS — Ht 64.0 in | Wt 200.0 lb

## 2013-05-10 DIAGNOSIS — G4730 Sleep apnea, unspecified: Secondary | ICD-10-CM | POA: Insufficient documentation

## 2013-05-10 DIAGNOSIS — G4733 Obstructive sleep apnea (adult) (pediatric): Secondary | ICD-10-CM

## 2013-05-10 DIAGNOSIS — G479 Sleep disorder, unspecified: Secondary | ICD-10-CM | POA: Insufficient documentation

## 2013-05-10 DIAGNOSIS — G4710 Hypersomnia, unspecified: Secondary | ICD-10-CM | POA: Insufficient documentation

## 2013-05-16 DIAGNOSIS — R0989 Other specified symptoms and signs involving the circulatory and respiratory systems: Secondary | ICD-10-CM

## 2013-05-16 DIAGNOSIS — G4730 Sleep apnea, unspecified: Secondary | ICD-10-CM

## 2013-05-16 DIAGNOSIS — G479 Sleep disorder, unspecified: Secondary | ICD-10-CM

## 2013-05-16 DIAGNOSIS — G4710 Hypersomnia, unspecified: Secondary | ICD-10-CM

## 2013-05-16 DIAGNOSIS — R0609 Other forms of dyspnea: Secondary | ICD-10-CM

## 2013-05-16 NOTE — Procedures (Signed)
Alexis Fox, Alexis Fox                ACCOUNT NO.:  [id number]  MEDICAL RECORD NO.:  [id number]          PATIENT TYPE:  OUT  LOCATION:  SLEEP CENTER                 FACILITY:  [facility]  PHYSICIAN:  Clinton D. Maple Hudson, MD, FCCP, FACPDATE OF BIRTH:  1956/03/14  DATE OF STUDY:  05/10/2013                           NOCTURNAL POLYSOMNOGRAM  REFERRING PHYSICIAN:  Thora Lance, M.D.  INDICATION FOR STUDY:  Hypersomnia with sleep apnea.  EPWORTH SLEEPINESS SCORE:  17/24.  BMI 34, weight 200 pounds.  Height 5 feet 4 inches, neck 12.5 inches.  MEDICATIONS:  Home medications charted and reviewed.  SLEEP ARCHITECTURE:  Total sleep time 322 minutes with sleep efficiency 89.6%.  Stage I was 3.1%, stage II 87%, stage III absent.  REM 9.9% of total sleep time.  Sleep latency 10 minutes, REM latency 159 minutes. Awake after sleep onset at 27.5 minutes.  Arousal index 5.6.  BEDTIME MEDICATION:  None listed.  RESPIRATORY DATA:  Apnea-hypopnea index (AHI) 0.9 per hour.  A total of 5 events was scored, all hypopneas, non-positional.  REM AHI 0.  There were not enough events to permit CPAP titration.  OXYGEN DATA:  Moderate snoring with oxygen desaturation to a nadir of 88% and mean oxygen saturation through the study of 93.9% on room air.  CARDIAC DATA:  Sinus rhythm.  MOVEMENT-PARASOMNIA:  No significant movement disturbance.  No bathroom trips.  IMPRESSION-RECOMMENDATION: 1. Unremarkable sleep architecture except for reduced amount of time     spent in REM.  No bedtime medication was taken.  Note, the patient     complains of daytime sleepiness and takes several medications which     may be sedating, especially clonazepam. 2. Occasional respiratory event with sleep disturbance, within normal     limits.  AHI 0.9 per hour (the normal range     for adults is from 0-5 events per hour).  Moderate snoring with     oxygen desaturation to a nadir of 88% and mean oxygen saturation     through the study  of 93.9% on room air.     Clinton D. Maple Hudson, MD, [facility], FACP Diplomate, American Board of Sleep Medicine    CDY/MEDQ  D:  05/16/2013 11:49:06  T:  05/16/2013 14:03:01  Job:  [id number]

## 2013-05-19 DIAGNOSIS — M545 Low back pain, unspecified: Secondary | ICD-10-CM | POA: Insufficient documentation

## 2013-06-24 ENCOUNTER — Other Ambulatory Visit (HOSPITAL_COMMUNITY)
Admission: RE | Admit: 2013-06-24 | Discharge: 2013-06-24 | Disposition: A | Discharge status: Home / Self Care | Payer: 59 | Source: Ambulatory Visit | Attending: Women's Health | Admitting: Women's Health

## 2013-06-24 ENCOUNTER — Encounter: Payer: Self-pay | Admitting: Women's Health

## 2013-06-24 ENCOUNTER — Ambulatory Visit (INDEPENDENT_AMBULATORY_CARE_PROVIDER_SITE_OTHER): Payer: 59 | Admitting: Women's Health

## 2013-06-24 VITALS — BP 114/72 | Ht 64.5 in | Wt 200.0 lb

## 2013-06-24 DIAGNOSIS — N898 Other specified noninflammatory disorders of vagina: Secondary | ICD-10-CM

## 2013-06-24 DIAGNOSIS — G2581 Restless legs syndrome: Secondary | ICD-10-CM

## 2013-06-24 DIAGNOSIS — Z1151 Encounter for screening for human papillomavirus (HPV): Secondary | ICD-10-CM | POA: Insufficient documentation

## 2013-06-24 DIAGNOSIS — Z01419 Encounter for gynecological examination (general) (routine) without abnormal findings: Secondary | ICD-10-CM | POA: Insufficient documentation

## 2013-06-24 DIAGNOSIS — F329 Major depressive disorder, single episode, unspecified: Secondary | ICD-10-CM

## 2013-06-24 DIAGNOSIS — N899 Noninflammatory disorder of vagina, unspecified: Secondary | ICD-10-CM

## 2013-06-24 DIAGNOSIS — Z780 Asymptomatic menopausal state: Secondary | ICD-10-CM

## 2013-06-24 DIAGNOSIS — F3289 Other specified depressive episodes: Secondary | ICD-10-CM

## 2013-06-24 DIAGNOSIS — F32A Depression, unspecified: Secondary | ICD-10-CM

## 2013-06-24 MED ORDER — ESTRADIOL-NORETHINDRONE ACET 0.5-0.1 MG PO TABS: 1.0000 | ORAL_TABLET | Freq: Every day | ORAL | Status: DC

## 2013-06-24 MED ORDER — CLOBETASOL PROPIONATE 0.05 % EX CREA: TOPICAL_CREAM | Freq: Two times a day (BID) | CUTANEOUS | Status: DC

## 2013-06-24 NOTE — Addendum Note (Signed)
Addended by: Richardson Chiquito on: 06/24/2013 02:54 PM   Modules accepted: Orders

## 2013-06-24 NOTE — Patient Instructions (Signed)
Health Recommendations for Postmenopausal Women Respected and ongoing research has looked at the most common causes of death, disability, and poor quality of life in postmenopausal women. The causes include heart disease, diseases of blood vessels, diabetes, depression, cancer, and bone loss (osteoporosis). Many things can be done to help lower the chances of developing these and other common problems: CARDIOVASCULAR DISEASE Heart Disease: A heart attack is a medical emergency. Know the signs and symptoms of a heart attack. Below are things women can do to reduce their risk for heart disease.   Do not smoke. If you smoke, quit.  Aim for a healthy weight. Being overweight causes many preventable deaths. Eat a healthy and balanced diet and drink an adequate amount of liquids.  Get moving. Make a commitment to be more physically active. Aim for 30 minutes of activity on most, if not all days of the week.  Eat for heart health. Choose a diet that is low in saturated fat and cholesterol and eliminate trans fat. Include whole grains, vegetables, and fruits. Read and understand the labels on food containers before buying.  Know your numbers. Ask your caregiver to check your blood pressure, cholesterol (total, HDL, LDL, triglycerides) and blood glucose. Work with your caregiver on improving your entire clinical picture.  High blood pressure. Limit or stop your table salt intake (try salt substitute and food seasonings). Avoid salty foods and drinks. Read labels on food containers before buying. Eating well and exercising can help control high blood pressure. STROKE  Stroke is a medical emergency. Stroke may be the result of a blood clot in a blood vessel in the brain or by a brain hemorrhage (bleeding). Know the signs and symptoms of a stroke. To lower the risk of developing a stroke:  Avoid fatty foods.  Quit smoking.  Control your diabetes, blood pressure, and irregular heart rate. THROMBOPHLEBITIS  (BLOOD CLOT) OF THE LEG  Becoming overweight and leading a stationary lifestyle may also contribute to developing blood clots. Controlling your diet and exercising will help lower the risk of developing blood clots. CANCER SCREENING  Breast Cancer: Take steps to reduce your risk of breast cancer.  You should practice "breast self-awareness." This means understanding the normal appearance and feel of your breasts and should include breast self-examination. Any changes detected, no matter how small, should be reported to your caregiver.  After age 40, you should have a clinical breast exam (CBE) every year.  Starting at age 40, you should consider having a mammogram (breast X-ray) every year.  If you have a family history of breast cancer, talk to your caregiver about genetic screening.  If you are at high risk for breast cancer, talk to your caregiver about having an MRI and a mammogram every year.  Intestinal or Stomach Cancer: Tests to consider are a rectal exam, fecal occult blood, sigmoidoscopy, and colonoscopy. Women who are high risk may need to be screened at an earlier age and more often.  Cervical Cancer:  Beginning at age 30, you should have a Pap test every 3 years as long as the past 3 Pap tests have been normal.  If you have had past treatment for cervical cancer or a condition that could lead to cancer, you need Pap tests and screening for cancer for at least 20 years after your treatment.  If you had a hysterectomy for a problem that was not cancer or a condition that could lead to cancer, then you no longer need Pap tests.    If you are between ages 65 and 70, and you have had normal Pap tests going back 10 years, you no longer need Pap tests.  If Pap tests have been discontinued, risk factors (such as a new sexual partner) need to be reassessed to determine if screening should be resumed.  Some medical problems can increase the chance of getting cervical cancer. In these  cases, your caregiver may recommend more frequent screening and Pap tests.  Uterine Cancer: If you have vaginal bleeding after reaching menopause, you should notify your caregiver.  Ovarian cancer: Other than yearly pelvic exams, there are no reliable tests available to screen for ovarian cancer at this time except for yearly pelvic exams.  Lung Cancer: Yearly chest X-rays can detect lung cancer and should be done on high risk women, such as cigarette smokers and women with chronic lung disease (emphysema).  Skin Cancer: A complete body skin exam should be done at your yearly examination. Avoid overexposure to the sun and ultraviolet light lamps. Use a strong sun block cream when in the sun. All of these things are important in lowering the risk of skin cancer. MENOPAUSE Menopause Symptoms: Hormone therapy products are effective for treating symptoms associated with menopause:  Moderate to severe hot flashes.  Night sweats.  Mood swings.  Headaches.  Tiredness.  Loss of sex drive.  Insomnia.  Other symptoms. Hormone replacement carries certain risks, especially in older women. Women who use or are thinking about using estrogen or estrogen with progestin treatments should discuss that with their caregiver. Your caregiver will help you understand the benefits and risks. The ideal dose of hormone replacement therapy is not known. The Food and Drug Administration (FDA) has concluded that hormone therapy should be used only at the lowest doses and for the shortest amount of time to reach treatment goals.  OSTEOPOROSIS Protecting Against Bone Loss and Preventing Fracture: If you use hormone therapy for prevention of bone loss (osteoporosis), the risks for bone loss must outweigh the risk of the therapy. Ask your caregiver about other medications known to be safe and effective for preventing bone loss and fractures. To guard against bone loss or fractures, the following is recommended:  If  you are less than age 50, take 1000 mg of calcium and at least 600 mg of Vitamin D per day.  If you are greater than age 50 but less than age 70, take 1200 mg of calcium and at least 600 mg of Vitamin D per day.  If you are greater than age 70, take 1200 mg of calcium and at least 800 mg of Vitamin D per day. Smoking and excessive alcohol intake increases the risk of osteoporosis. Eat foods rich in calcium and vitamin D and do weight bearing exercises several times a week as your caregiver suggests. DIABETES Diabetes Melitus: If you have Type I or Type 2 diabetes, you should keep your blood sugar under control with diet, exercise and recommended medication. Avoid too many sweets, starchy and fatty foods. Being overweight can make control more difficult. COGNITION AND MEMORY Cognition and Memory: Menopausal hormone therapy is not recommended for the prevention of cognitive disorders such as Alzheimer's disease or memory loss.  DEPRESSION  Depression may occur at any age, but is common in elderly women. The reasons may be because of physical, medical, social (loneliness), or financial problems and needs. If you are experiencing depression because of medical problems and control of symptoms, talk to your caregiver about this. Physical activity and   exercise may help with mood and sleep. Community and volunteer involvement may help your sense of value and worth. If you have depression and you feel that the problem is getting worse or becoming severe, talk to your caregiver about treatment options that are best for you. ACCIDENTS  Accidents are common and can be serious in the elderly woman. Prepare your house to prevent accidents. Eliminate throw rugs, place hand bars in the bath, shower and toilet areas. Avoid wearing high heeled shoes or walking on wet, snowy, and icy areas. Limit or stop driving if you have vision or hearing problems, or you feel you are unsteady with you movements and  reflexes. HEPATITIS C Hepatitis C is a type of viral infection affecting the liver. It is spread mainly through contact with blood from an infected person. It can be treated, but if left untreated, it can lead to severe liver damage over years. Many people who are infected do not know that the virus is in their blood. If you are a "baby-boomer", it is recommended that you have one screening test for Hepatitis C. IMMUNIZATIONS  Several immunizations are important to consider having during your senior years, including:   Tetanus, diptheria, and pertussis booster shot.  Influenza every year before the flu season begins.  Pneumonia vaccine.  Shingles vaccine.  Others as indicated based on your specific needs. Talk to your caregiver about these. Document Released: 01/11/2006 Document Revised: 11/05/2012 Document Reviewed: 09/06/2008 [employer] Patient Information 2014 [employer].  

## 2013-06-24 NOTE — Progress Notes (Signed)
MANIE BEALER October 01, 1956 [id number]    History:    The patient presents for annual exam.  Postmenopausal/ no bleeding on HRT. Reports not being able to stop 22 numerous hot flushes. Negative colonoscopy 2009. History of HSV with rare outbreaks.09/2012 normal mammograms after ultrasound. Primary care manages depression and insomnia, currently on Wellbutrin, Celexa and Ambien. Also has restless like syndrome Dr. Theresia Bough manages with Klonopin and Mirapex. Had problems with severe depression greater than a year ago but is doing much better now. Normal DEXA 2008, T score -0.9 left femoral neck.   Past medical history, past surgical history, family history and social history were all reviewed and documented in the EPIC chart. 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   ROS:  A  ROS was performed and pertinent positives and negatives are included in the history.  Exam:  Filed Vitals:   06/24/13 1215  BP: 114/72    General appearance:  Normal Head/Neck:  Normal, without cervical or supraclavicular adenopathy. Thyroid:  Symmetrical, normal in size, without palpable masses or nodularity. Respiratory  Effort:  Normal  Auscultation:  Clear without wheezing or rhonchi Cardiovascular  Auscultation:  Regular rate, without rubs, murmurs or gallops  Edema/varicosities:  Not grossly evident Abdominal  Soft,nontender, without masses, guarding or rebound.  Liver/spleen:  No organomegaly noted  Hernia:  None appreciated  Skin  Inspection:  Grossly normal  Palpation:  Grossly normal Neurologic/psychiatric  Orientation:  Normal with appropriate conversation.  Mood/affect:  Normal  Genitourinary    Breasts: Examined lying and sitting.     Right: Without masses, retractions, discharge or axillary adenopathy.     Left: Without masses, retractions, discharge or axillary adenopathy.   Inguinal/mons:  Normal without inguinal adenopathy  External  genitalia:  erythematous  BUS/Urethra/Skene's glands:  Normal  Bladder:  Normal  Vagina:  Normal  Cervix:  Normal  Uterus:   normal in size, shape and contour.  Midline and mobile  Adnexa/parametria:     Rt: Without masses or tenderness.   Lt: Without masses or tenderness.  Anus and perineum: Normal  Digital rectal exam: Normal sphincter tone without palpated masses or tenderness  Assessment/Plan:  57 y.o. M. WF G3 P2 for annual exam with complaint of external vaginal burning with some itching, some relief with over-the-counter cortisone cream.  Vaginal irritation Postmenopausal on HRT Depression/insomnia-stable primary care manages labs and meds Restless leg syndrome - Dr. Theresia Bough  manages meds  Plan: Pap, reviewed new screening guidelines. Reports normal Pap 2012 at primary care. Temovate small amount externally twice daily for 1 week if no relief instructed to call, reviewed possible biopsy may be needed. SBE's, continue annual mammogram, calcium rich diet, vitamin D 2000 daily encouraged. Reviewed importance of increasing regular exercise and decreasing calories for weight loss. DEXA will schedule here. Home Hemoccult card given with instructions. Activella 0.5/.1 prescription, proper use given and reviewed slight risk for blood clots, strokes, breast cancer and accepts.     EVVIE, BEHRMANN WHNP, 1:13 PM 06/24/2013

## 2013-06-25 ENCOUNTER — Encounter: Payer: Self-pay | Admitting: Obstetrics and Gynecology

## 2013-09-14 ENCOUNTER — Other Ambulatory Visit: Payer: Self-pay | Admitting: Internal Medicine

## 2013-09-14 ENCOUNTER — Ambulatory Visit
Admission: RE | Admit: 2013-09-14 | Discharge: 2013-09-14 | Disposition: A | Discharge status: Home / Self Care | Payer: 59 | Source: Ambulatory Visit | Attending: Internal Medicine | Admitting: Internal Medicine

## 2013-09-14 DIAGNOSIS — M546 Pain in thoracic spine: Secondary | ICD-10-CM

## 2013-09-25 ENCOUNTER — Other Ambulatory Visit: Payer: Self-pay

## 2013-09-25 DIAGNOSIS — Z1231 Encounter for screening mammogram for malignant neoplasm of breast: Secondary | ICD-10-CM

## 2013-10-08 ENCOUNTER — Other Ambulatory Visit: Payer: Self-pay

## 2013-10-16 ENCOUNTER — Ambulatory Visit
Admission: RE | Admit: 2013-10-16 | Discharge: 2013-10-16 | Disposition: A | Discharge status: Home / Self Care | Payer: 59 | Source: Ambulatory Visit

## 2013-10-16 DIAGNOSIS — Z1231 Encounter for screening mammogram for malignant neoplasm of breast: Secondary | ICD-10-CM

## 2014-01-22 ENCOUNTER — Encounter: Payer: Self-pay | Admitting: Podiatrist

## 2014-01-22 ENCOUNTER — Ambulatory Visit (INDEPENDENT_AMBULATORY_CARE_PROVIDER_SITE_OTHER): Payer: 59 | Admitting: Podiatrist

## 2014-01-22 ENCOUNTER — Ambulatory Visit (INDEPENDENT_AMBULATORY_CARE_PROVIDER_SITE_OTHER): Payer: 59

## 2014-01-22 VITALS — BP 116/70 | HR 76 | Resp 12

## 2014-01-22 DIAGNOSIS — M722 Plantar fascial fibromatosis: Secondary | ICD-10-CM

## 2014-01-22 DIAGNOSIS — R52 Pain, unspecified: Secondary | ICD-10-CM

## 2014-01-22 MED ORDER — DICLOFENAC SODIUM 75 MG PO TBEC: 75.0000 mg | DELAYED_RELEASE_TABLET | Freq: Two times a day (BID) | ORAL | Status: DC

## 2014-01-22 NOTE — Progress Notes (Signed)
   Subjective:    Patient ID: Alexis Fox, female    DOB: 06/07/1956, 58 y.o.   MRN: [id number]  HPI PT STATED THE RT FOOT IS BEEN HURTING FOR 1 MONTH. TRIED STRETCHING, ICING NIGHT SPLINT AND ALEVE BUT IS NOT GETTING BETTER.    Review of Systems  HENT: Positive for sinus pressure.   Musculoskeletal: Positive for back pain.  Allergic/Immunologic: Positive for environmental allergies.  All other systems reviewed and are negative.       Objective:   Physical Exam  GENERAL APPEARANCE: Alert, conversant. Appropriately groomed. No acute distress.  VASCULAR: Pedal pulses palpable and strong bilateral.  Capillary refill time is immediate to all digits,  Proximal to distal cooling it warm to warm.  Digital hair growth is present bilateral  NEUROLOGIC: sensation is intact epicritically and protectively to 5.07 monofilament at 5/5 sites bilateral.  Light touch is intact bilateral, vibratory sensation intact bilateral, achilles tendon reflex is intact bilateral.  Negative Tinel sign is elicited MUSCULOSKELETAL: Pain on palpation plantar medial aspect right foot  at insertion of plantar fascia on the medial calcaneal tubercle. Inflammation at the insertion of the plantar fascia is present. Rectus foot type is seen. DERMATOLOGIC: skin color, texture, and turger are within normal limits.  No preulcerative lesions are seen, no interdigital maceration noted.  No open lesions present.  Digital nails are asymptomatic.     Assessment & Plan:  Plantar fasciitis right  Plan:  Discussed etiology, pathology, conservative vs. Surgical therapies and at this time a plantar fascial injection was recommended.  The patient agreed and a sterile skin prep was applied.  An injection consisting of kenalog and marcaine mixture was infiltrated at the point of maximal tenderness on the right Heel.  The patient tolerated this well and was given instructions for aftercare.

## 2014-01-22 NOTE — Patient Instructions (Signed)

## 2014-02-02 ENCOUNTER — Other Ambulatory Visit (HOSPITAL_COMMUNITY): Payer: Self-pay | Admitting: Orthopaedic Surgery

## 2014-02-02 DIAGNOSIS — M545 Low back pain, unspecified: Secondary | ICD-10-CM

## 2014-02-02 DIAGNOSIS — M25512 Pain in left shoulder: Secondary | ICD-10-CM

## 2014-02-09 ENCOUNTER — Ambulatory Visit (HOSPITAL_COMMUNITY)
Admission: RE | Admit: 2014-02-09 | Discharge: 2014-02-09 | Disposition: A | Discharge status: Home / Self Care | Payer: 59 | Source: Ambulatory Visit | Attending: Orthopaedic Surgery | Admitting: Orthopaedic Surgery

## 2014-02-09 DIAGNOSIS — M25512 Pain in left shoulder: Secondary | ICD-10-CM

## 2014-02-09 DIAGNOSIS — M545 Low back pain, unspecified: Secondary | ICD-10-CM

## 2014-02-09 DIAGNOSIS — M48061 Spinal stenosis, lumbar region without neurogenic claudication: Secondary | ICD-10-CM | POA: Insufficient documentation

## 2014-02-09 DIAGNOSIS — Z981 Arthrodesis status: Secondary | ICD-10-CM | POA: Insufficient documentation

## 2014-02-09 DIAGNOSIS — W19XXXA Unspecified fall, initial encounter: Secondary | ICD-10-CM | POA: Insufficient documentation

## 2014-02-09 DIAGNOSIS — M5126 Other intervertebral disc displacement, lumbar region: Secondary | ICD-10-CM | POA: Insufficient documentation

## 2014-02-09 DIAGNOSIS — M549 Dorsalgia, unspecified: Secondary | ICD-10-CM | POA: Insufficient documentation

## 2014-02-09 DIAGNOSIS — M25519 Pain in unspecified shoulder: Secondary | ICD-10-CM | POA: Insufficient documentation

## 2014-02-09 DIAGNOSIS — M47817 Spondylosis without myelopathy or radiculopathy, lumbosacral region: Secondary | ICD-10-CM | POA: Insufficient documentation

## 2014-02-09 DIAGNOSIS — M79609 Pain in unspecified limb: Secondary | ICD-10-CM | POA: Insufficient documentation

## 2014-02-09 DIAGNOSIS — M5137 Other intervertebral disc degeneration, lumbosacral region: Secondary | ICD-10-CM | POA: Insufficient documentation

## 2014-02-09 DIAGNOSIS — R209 Unspecified disturbances of skin sensation: Secondary | ICD-10-CM | POA: Insufficient documentation

## 2014-02-09 DIAGNOSIS — M51379 Other intervertebral disc degeneration, lumbosacral region without mention of lumbar back pain or lower extremity pain: Secondary | ICD-10-CM | POA: Insufficient documentation

## 2014-02-12 ENCOUNTER — Ambulatory Visit: Payer: 59 | Admitting: Podiatrist

## 2014-02-19 ENCOUNTER — Ambulatory Visit (HOSPITAL_COMMUNITY): Payer: 59

## 2014-03-01 ENCOUNTER — Ambulatory Visit: Payer: 59 | Attending: Orthopaedic Surgery

## 2014-03-01 DIAGNOSIS — M545 Low back pain, unspecified: Secondary | ICD-10-CM | POA: Insufficient documentation

## 2014-03-01 DIAGNOSIS — M2550 Pain in unspecified joint: Secondary | ICD-10-CM | POA: Insufficient documentation

## 2014-03-01 DIAGNOSIS — IMO0001 Reserved for inherently not codable concepts without codable children: Secondary | ICD-10-CM | POA: Insufficient documentation

## 2014-03-10 ENCOUNTER — Ambulatory Visit: Payer: 59 | Attending: Orthopaedic Surgery | Admitting: Physical Therapy

## 2014-03-10 DIAGNOSIS — IMO0001 Reserved for inherently not codable concepts without codable children: Secondary | ICD-10-CM | POA: Insufficient documentation

## 2014-03-10 DIAGNOSIS — M545 Low back pain, unspecified: Secondary | ICD-10-CM | POA: Insufficient documentation

## 2014-03-10 DIAGNOSIS — M2550 Pain in unspecified joint: Secondary | ICD-10-CM | POA: Insufficient documentation

## 2014-03-22 ENCOUNTER — Ambulatory Visit: Payer: 59 | Admitting: Physical Therapy

## 2014-03-26 ENCOUNTER — Encounter: Payer: 59 | Admitting: Physical Therapy

## 2014-03-31 ENCOUNTER — Ambulatory Visit: Payer: 59 | Admitting: Physical Therapy

## 2014-04-07 ENCOUNTER — Ambulatory Visit: Payer: 59 | Attending: Orthopaedic Surgery | Admitting: Physical Therapy

## 2014-04-07 DIAGNOSIS — IMO0001 Reserved for inherently not codable concepts without codable children: Secondary | ICD-10-CM | POA: Insufficient documentation

## 2014-04-07 DIAGNOSIS — M2550 Pain in unspecified joint: Secondary | ICD-10-CM | POA: Insufficient documentation

## 2014-04-07 DIAGNOSIS — M545 Low back pain, unspecified: Secondary | ICD-10-CM | POA: Insufficient documentation

## 2014-04-12 ENCOUNTER — Encounter: Payer: 59 | Admitting: Physical Therapy

## 2014-04-16 ENCOUNTER — Encounter: Payer: 59 | Admitting: Physical Therapy

## 2014-04-21 ENCOUNTER — Encounter: Payer: 59 | Admitting: Physical Therapy

## 2014-04-23 ENCOUNTER — Encounter: Payer: 59 | Admitting: Physical Therapy

## 2014-04-28 ENCOUNTER — Encounter: Payer: 59 | Admitting: Physical Therapy

## 2014-04-30 ENCOUNTER — Ambulatory Visit: Payer: 59 | Admitting: Physical Therapy

## 2014-09-17 ENCOUNTER — Other Ambulatory Visit: Payer: Self-pay | Admitting: Women's Health

## 2014-10-04 ENCOUNTER — Encounter: Payer: Self-pay | Admitting: Podiatrist

## 2014-10-12 ENCOUNTER — Other Ambulatory Visit: Payer: Self-pay | Admitting: Women's Health

## 2014-10-13 ENCOUNTER — Other Ambulatory Visit: Payer: Self-pay | Admitting: Women's Health

## 2014-10-19 DIAGNOSIS — M5416 Radiculopathy, lumbar region: Secondary | ICD-10-CM | POA: Insufficient documentation

## 2014-10-21 ENCOUNTER — Other Ambulatory Visit (HOSPITAL_COMMUNITY): Payer: Self-pay | Admitting: Neurosurgery

## 2014-10-21 DIAGNOSIS — M5416 Radiculopathy, lumbar region: Secondary | ICD-10-CM

## 2014-11-04 ENCOUNTER — Ambulatory Visit (HOSPITAL_COMMUNITY)
Admission: RE | Admit: 2014-11-04 | Discharge: 2014-11-04 | Disposition: A | Discharge status: Home / Self Care | Payer: 59 | Source: Ambulatory Visit | Attending: Neurosurgery | Admitting: Neurosurgery

## 2014-11-04 DIAGNOSIS — M5416 Radiculopathy, lumbar region: Secondary | ICD-10-CM | POA: Insufficient documentation

## 2014-11-04 MED ORDER — GADOBENATE DIMEGLUMINE 529 MG/ML IV SOLN
19.0000 mL | Freq: Once | INTRAVENOUS | Status: AC | PRN
  Administered 2014-11-04: 19 mL via INTRAVENOUS

## 2014-11-10 DIAGNOSIS — M5417 Radiculopathy, lumbosacral region: Secondary | ICD-10-CM | POA: Insufficient documentation

## 2014-11-19 ENCOUNTER — Encounter: Payer: Self-pay | Admitting: Women's Health

## 2014-11-19 ENCOUNTER — Ambulatory Visit (INDEPENDENT_AMBULATORY_CARE_PROVIDER_SITE_OTHER): Payer: 59 | Admitting: Women's Health

## 2014-11-19 VITALS — BP 124/80 | Ht 64.0 in | Wt 206.0 lb

## 2014-11-19 DIAGNOSIS — Z79890 Hormone replacement therapy (postmenopausal): Secondary | ICD-10-CM

## 2014-11-19 DIAGNOSIS — Z01419 Encounter for gynecological examination (general) (routine) without abnormal findings: Secondary | ICD-10-CM

## 2014-11-19 MED ORDER — ESTRADIOL-NORETHINDRONE ACET 0.5-0.1 MG PO TABS: 1.0000 | ORAL_TABLET | Freq: Every day | ORAL | Status: DC

## 2014-11-19 NOTE — Progress Notes (Signed)
Alexis Fox 11-Sep-1956 [id number]    History:    Presents for annual exam.  Postmenopausal on HRT with no bleeding. States has 1 try to stop HRT has increased hot flashes and does not feel as well. Normal Pap and mammogram history. HSV rare outbreaks. 2013 severe depression much better primary care manages medications. 2008 normal DEXA. Negative colonoscopy.  Past medical history, past surgical history, family history and social history were all reviewed and documented in the EPIC chart. 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  ROS:  A ROS was performed and pertinent positives and negatives are included.  Exam:  Filed Vitals:   11/19/14 1052  BP: 124/80    General appearance:  Normal Thyroid:  Symmetrical, normal in size, without palpable masses or nodularity. Respiratory  Auscultation:  Clear without wheezing or rhonchi Cardiovascular  Auscultation:  Regular rate, without rubs, murmurs or gallops  Edema/varicosities:  Not grossly evident Abdominal  Soft,nontender, without masses, guarding or rebound.  Liver/spleen:  No organomegaly noted  Hernia:  None appreciated  Skin  Inspection:  Grossly normal   Breasts: Examined lying and sitting.     Right: Without masses, retractions, discharge or axillary adenopathy.     Left: Without masses, retractions, discharge or axillary adenopathy. Gentitourinary   Inguinal/mons:  Normal without inguinal adenopathy  External genitalia:  Normal  BUS/Urethra/Skene's glands:  Normal  Vagina:  Normal  Cervix:  Normal  Uterus:   normal in size, shape and contour.  Midline and mobile  Adnexa/parametria:     Rt: Without masses or tenderness.   Lt: Without masses or tenderness.  Anus and perineum: Normal  Digital rectal exam: Normal sphincter tone without palpated masses or tenderness  Assessment/Plan:  58 y.o. M WF G2 P2  for annual exam with no complaints.  Postmenopausal/no bleeding on  HRT Depression/insomnia/restless leg-primary care manages labs and meds HSV-rare outbreaks  Plan: Activella 0.5/0.1 prescription, proper use given and reviewed risk for blood clots, strokes, breast cancer. Women's health initiative reviewed prefers to continue. SBE's, continue annual mammogram, calcium rich diet, vitamin D 2000 daily encouraged. Instructed to have vitamin D level checked at annual exam at primary care. Repeat DEXA, home safety, fall prevention and importance of regular weightbearing exercise reviewed. Valtrex 500 twice daily for 3-5 days as needed prescription, proper use given and reviewed. UA. Pap ascus with negative HR HPV 2014.  Alexis Fox, Alexis Fox [facility], 12:40 PM 11/19/2014

## 2014-11-19 NOTE — Patient Instructions (Signed)
Health Recommendations for Postmenopausal Women Respected and ongoing research has looked at the most common causes of death, disability, and poor quality of life in postmenopausal women. The causes include heart disease, diseases of blood vessels, diabetes, depression, cancer, and bone loss (osteoporosis). Many things can be done to help lower the chances of developing these and other common problems. CARDIOVASCULAR DISEASE Heart Disease: A heart attack is a medical emergency. Know the signs and symptoms of a heart attack. Below are things women can do to reduce their risk for heart disease.   Do not smoke. If you smoke, quit.  Aim for a healthy weight. Being overweight causes many preventable deaths. Eat a healthy and balanced diet and drink an adequate amount of liquids.  Get moving. Make a commitment to be more physically active. Aim for 30 minutes of activity on most, if not all days of the week.  Eat for heart health. Choose a diet that is low in saturated fat and cholesterol and eliminate trans fat. Include whole grains, vegetables, and fruits. Read and understand the labels on food containers before buying.  Know your numbers. Ask your caregiver to check your blood pressure, cholesterol (total, HDL, LDL, triglycerides) and blood glucose. Work with your caregiver on improving your entire clinical picture.  High blood pressure. Limit or stop your table salt intake (try salt substitute and food seasonings). Avoid salty foods and drinks. Read labels on food containers before buying. Eating well and exercising can help control high blood pressure. STROKE  Stroke is a medical emergency. Stroke may be the result of a blood clot in a blood vessel in the brain or by a brain hemorrhage (bleeding). Know the signs and symptoms of a stroke. To lower the risk of developing a stroke:  Avoid fatty foods.  Quit smoking.  Control your diabetes, blood pressure, and irregular heart rate. THROMBOPHLEBITIS  (BLOOD CLOT) OF THE LEG  Becoming overweight and leading a stationary lifestyle may also contribute to developing blood clots. Controlling your diet and exercising will help lower the risk of developing blood clots. CANCER SCREENING  Breast Cancer: Take steps to reduce your risk of breast cancer.  You should practice "breast self-awareness." This means understanding the normal appearance and feel of your breasts and should include breast self-examination. Any changes detected, no matter how small, should be reported to your caregiver.  After age 40, you should have a clinical breast exam (CBE) every year.  Starting at age 40, you should consider having a mammogram (breast X-ray) every year.  If you have a family history of breast cancer, talk to your caregiver about genetic screening.  If you are at high risk for breast cancer, talk to your caregiver about having an MRI and a mammogram every year.  Intestinal or Stomach Cancer: Tests to consider are a rectal exam, fecal occult blood, sigmoidoscopy, and colonoscopy. Women who are high risk may need to be screened at an earlier age and more often.  Cervical Cancer:  Beginning at age 30, you should have a Pap test every 3 years as long as the past 3 Pap tests have been normal.  If you have had past treatment for cervical cancer or a condition that could lead to cancer, you need Pap tests and screening for cancer for at least 20 years after your treatment.  If you had a hysterectomy for a problem that was not cancer or a condition that could lead to cancer, then you no longer need Pap tests.    If you are between ages 65 and 70, and you have had normal Pap tests going back 10 years, you no longer need Pap tests.  If Pap tests have been discontinued, risk factors (such as a new sexual partner) need to be reassessed to determine if screening should be resumed.  Some medical problems can increase the chance of getting cervical cancer. In these  cases, your caregiver may recommend more frequent screening and Pap tests.  Uterine Cancer: If you have vaginal bleeding after reaching menopause, you should notify your caregiver.  Ovarian Cancer: Other than yearly pelvic exams, there are no reliable tests available to screen for ovarian cancer at this time except for yearly pelvic exams.  Lung Cancer: Yearly chest X-rays can detect lung cancer and should be done on high risk women, such as cigarette smokers and women with chronic lung disease (emphysema).  Skin Cancer: A complete body skin exam should be done at your yearly examination. Avoid overexposure to the sun and ultraviolet light lamps. Use a strong sun block cream when in the sun. All of these things are important for lowering the risk of skin cancer. MENOPAUSE Menopause Symptoms: Hormone therapy products are effective for treating symptoms associated with menopause:  Moderate to severe hot flashes.  Night sweats.  Mood swings.  Headaches.  Tiredness.  Loss of sex drive.  Insomnia.  Other symptoms. Hormone replacement carries certain risks, especially in older women. Women who use or are thinking about using estrogen or estrogen with progestin treatments should discuss that with their caregiver. Your caregiver will help you understand the benefits and risks. The ideal dose of hormone replacement therapy is not known. The Food and Drug Administration (FDA) has concluded that hormone therapy should be used only at the lowest doses and for the shortest amount of time to reach treatment goals.  OSTEOPOROSIS Protecting Against Bone Loss and Preventing Fracture If you use hormone therapy for prevention of bone loss (osteoporosis), the risks for bone loss must outweigh the risk of the therapy. Ask your caregiver about other medications known to be safe and effective for preventing bone loss and fractures. To guard against bone loss or fractures, the following is recommended:  If  you are younger than age 50, take 1000 mg of calcium and at least 600 mg of Vitamin D per day.  If you are older than age 50 but younger than age 70, take 1200 mg of calcium and at least 600 mg of Vitamin D per day.  If you are older than age 70, take 1200 mg of calcium and at least 800 mg of Vitamin D per day. Smoking and excessive alcohol intake increases the risk of osteoporosis. Eat foods rich in calcium and vitamin D and do weight bearing exercises several times a week as your caregiver suggests. DIABETES Diabetes Mellitus: If you have type I or type 2 diabetes, you should keep your blood sugar under control with diet, exercise, and recommended medication. Avoid starchy and fatty foods, and too many sweets. Being overweight can make diabetes control more difficult. COGNITION AND MEMORY Cognition and Memory: Menopausal hormone therapy is not recommended for the prevention of cognitive disorders such as Alzheimer's disease or memory loss.  DEPRESSION  Depression may occur at any age, but it is common in elderly women. This may be because of physical, medical, social (loneliness), or financial problems and needs. If you are experiencing depression because of medical problems and control of symptoms, talk to your caregiver about this. Physical   activity and exercise may help with mood and sleep. Community and volunteer involvement may improve your sense of value and worth. If you have depression and you feel that the problem is getting worse or becoming severe, talk to your caregiver about which treatment options are best for you. ACCIDENTS  Accidents are common and can be serious in elderly woman. Prepare your house to prevent accidents. Eliminate throw rugs, place hand bars in bath, shower, and toilet areas. Avoid wearing high heeled shoes or walking on wet, snowy, and icy areas. Limit or stop driving if you have vision or hearing problems, or if you feel you are unsteady with your movements and  reflexes. HEPATITIS C Hepatitis C is a type of viral infection affecting the liver. It is spread mainly through contact with blood from an infected person. It can be treated, but if left untreated, it can lead to severe liver damage over the years. Many people who are infected do not know that the virus is in their blood. If you are a "baby-boomer", it is recommended that you have one screening test for Hepatitis C. IMMUNIZATIONS  Several immunizations are important to consider having during your senior years, including:   Tetanus, diphtheria, and pertussis booster shot.  Influenza every year before the flu season begins.  Pneumonia vaccine.  Shingles vaccine.  Others, as indicated based on your specific needs. Talk to your caregiver about these. Document Released: 01/11/2006 Document Revised: 04/05/2014 Document Reviewed: 09/06/2008 Exit[employer] Patient Information 2015 [employer]. This information is not intended to replace advice given to you by your health care provider. Make sure you discuss any questions you have with your health care provider.  

## 2014-11-20 LAB — URINALYSIS W MICROSCOPIC + REFLEX CULTURE
Bilirubin Urine: NEGATIVE
Casts: NONE SEEN
Crystals: NONE SEEN
Glucose, UA: NEGATIVE mg/dL
Hgb urine dipstick: NEGATIVE
Ketones, ur: NEGATIVE mg/dL
Nitrite: NEGATIVE
Protein, ur: NEGATIVE mg/dL
Specific Gravity, Urine: 1.021 (ref 1.005–1.030)
Squamous Epithelial / LPF: NONE SEEN
Urobilinogen, UA: 0.2 mg/dL (ref 0.0–1.0)
pH: 6.5 (ref 5.0–8.0)

## 2014-11-21 LAB — URINE CULTURE: Colony Count: 75000

## 2014-12-01 ENCOUNTER — Other Ambulatory Visit: Payer: Self-pay

## 2014-12-01 DIAGNOSIS — Z1231 Encounter for screening mammogram for malignant neoplasm of breast: Secondary | ICD-10-CM

## 2014-12-06 ENCOUNTER — Ambulatory Visit
Admission: RE | Admit: 2014-12-06 | Discharge: 2014-12-06 | Disposition: A | Discharge status: Home / Self Care | Payer: 59 | Source: Ambulatory Visit

## 2014-12-06 DIAGNOSIS — Z1231 Encounter for screening mammogram for malignant neoplasm of breast: Secondary | ICD-10-CM

## 2014-12-07 ENCOUNTER — Encounter: Payer: Self-pay | Admitting: Women's Health

## 2014-12-24 ENCOUNTER — Ambulatory Visit: Payer: 59 | Admitting: Podiatrist

## 2015-01-07 ENCOUNTER — Encounter: Payer: Self-pay | Admitting: Podiatrist

## 2015-01-07 ENCOUNTER — Ambulatory Visit (INDEPENDENT_AMBULATORY_CARE_PROVIDER_SITE_OTHER): Payer: 59 | Admitting: Podiatrist

## 2015-01-07 VITALS — BP 113/68 | HR 74 | Resp 12

## 2015-01-07 DIAGNOSIS — M722 Plantar fascial fibromatosis: Secondary | ICD-10-CM

## 2015-01-10 ENCOUNTER — Other Ambulatory Visit: Payer: Self-pay | Admitting: Women's Health

## 2015-01-10 ENCOUNTER — Other Ambulatory Visit: Payer: Self-pay | Admitting: Gynecology

## 2015-01-10 ENCOUNTER — Ambulatory Visit (INDEPENDENT_AMBULATORY_CARE_PROVIDER_SITE_OTHER): Payer: 59

## 2015-01-10 DIAGNOSIS — Z13820 Encounter for screening for osteoporosis: Secondary | ICD-10-CM

## 2015-01-11 NOTE — Progress Notes (Signed)
Chief Complaint  Patient presents with  . Foot Pain    '' would like to get new orthotics.''     HPI: Patient is 59 y.o. female who presents today for continued care of plantar fasciitis which has been doing well since she has been using her orthotics.  She states the orthotics keep her feet feeling well and she would like to get a similar pair to those she had made in the past.  She brings in her orthotics with her today.  She has required injections for plantar fasciitis in the past and states today her feet are feeling OK.    No Known Allergies  Physical Exam  Patient is awake, alert, and oriented x 3.  In no acute distress.  Vascular status is intact with palpable pedal pulses at 2/4 DP and PT bilateral and capillary refill time within normal limits. Neurological sensation is also intact bilaterally via Semmes Weinstein monofilament at 5/5 sites. Light touch, vibratory sensation, Achilles tendon reflex is intact. Dermatological exam reveals skin color, turger and texture as normal. No open lesions present.  Musculature intact with dorsiflexion, plantarflexion, inversion, eversion. Minimal plantar fasciitis symptomatolgy present plantar heels bilateral.  Pes planus to rectus foot type is identified.    Assessment: plantar fasciitis bilateral  Plan: orthotics are reocmmended to keep her feet feeling well. She was scanned at today's visit and we will do our best to make a replica of the orthotics she has gotten in the past and been so happy with.  Will call when the orthotics are ready for pick up.

## 2015-01-17 ENCOUNTER — Ambulatory Visit: Payer: 59 | Admitting: *Deleted

## 2015-01-17 DIAGNOSIS — M722 Plantar fascial fibromatosis: Secondary | ICD-10-CM

## 2015-01-17 NOTE — Progress Notes (Signed)
PICKING UP INSERTS

## 2015-01-17 NOTE — Patient Instructions (Signed)

## 2015-10-03 ENCOUNTER — Emergency Department (HOSPITAL_COMMUNITY): Payer: 59

## 2015-10-03 ENCOUNTER — Inpatient Hospital Stay (HOSPITAL_COMMUNITY)
Admission: EM | Admit: 2015-10-03 | Discharge: 2015-10-06 | DRG: 418 | Disposition: A | Discharge status: Home / Self Care | Payer: 59 | Attending: Internal Medicine | Admitting: Internal Medicine

## 2015-10-03 ENCOUNTER — Inpatient Hospital Stay (HOSPITAL_COMMUNITY): Payer: 59

## 2015-10-03 ENCOUNTER — Encounter (HOSPITAL_COMMUNITY): Payer: Self-pay

## 2015-10-03 DIAGNOSIS — K8050 Calculus of bile duct without cholangitis or cholecystitis without obstruction: Secondary | ICD-10-CM | POA: Diagnosis not present

## 2015-10-03 DIAGNOSIS — J9382 Other air leak: Secondary | ICD-10-CM | POA: Diagnosis present

## 2015-10-03 DIAGNOSIS — R1011 Right upper quadrant pain: Secondary | ICD-10-CM | POA: Diagnosis not present

## 2015-10-03 DIAGNOSIS — K219 Gastro-esophageal reflux disease without esophagitis: Secondary | ICD-10-CM | POA: Diagnosis present

## 2015-10-03 DIAGNOSIS — K668 Other specified disorders of peritoneum: Secondary | ICD-10-CM | POA: Diagnosis present

## 2015-10-03 DIAGNOSIS — R740 Nonspecific elevation of levels of transaminase and lactic acid dehydrogenase [LDH]: Secondary | ICD-10-CM | POA: Diagnosis present

## 2015-10-03 DIAGNOSIS — G2581 Restless legs syndrome: Secondary | ICD-10-CM | POA: Diagnosis present

## 2015-10-03 DIAGNOSIS — R7401 Elevation of levels of liver transaminase levels: Secondary | ICD-10-CM | POA: Diagnosis present

## 2015-10-03 DIAGNOSIS — K8070 Calculus of gallbladder and bile duct without cholecystitis without obstruction: Principal | ICD-10-CM | POA: Diagnosis present

## 2015-10-03 DIAGNOSIS — Z6835 Body mass index (BMI) 35.0-35.9, adult: Secondary | ICD-10-CM | POA: Diagnosis not present

## 2015-10-03 DIAGNOSIS — F329 Major depressive disorder, single episode, unspecified: Secondary | ICD-10-CM | POA: Diagnosis present

## 2015-10-03 DIAGNOSIS — K8020 Calculus of gallbladder without cholecystitis without obstruction: Secondary | ICD-10-CM | POA: Diagnosis present

## 2015-10-03 HISTORY — DX: Restless legs syndrome: G25.81

## 2015-10-03 HISTORY — DX: Cholecystitis, unspecified: K81.9

## 2015-10-03 LAB — URINALYSIS, ROUTINE W REFLEX MICROSCOPIC
Bilirubin Urine: NEGATIVE
Glucose, UA: NEGATIVE mg/dL
Hgb urine dipstick: NEGATIVE
Ketones, ur: NEGATIVE mg/dL
Leukocytes, UA: NEGATIVE
Nitrite: NEGATIVE
Protein, ur: NEGATIVE mg/dL
Specific Gravity, Urine: 1.02 (ref 1.005–1.030)
Urobilinogen, UA: 1 mg/dL (ref 0.0–1.0)
pH: 7 (ref 5.0–8.0)

## 2015-10-03 LAB — CBC WITH DIFFERENTIAL/PLATELET
Basophils Absolute: 0 K/uL (ref 0.0–0.1)
Basophils Relative: 0 %
Eosinophils Absolute: 0.1 K/uL (ref 0.0–0.7)
Eosinophils Relative: 2 %
HCT: 41.3 % (ref 36.0–46.0)
Hemoglobin: 13.2 g/dL (ref 12.0–15.0)
Lymphocytes Relative: 19 %
Lymphs Abs: 1.5 K/uL (ref 0.7–4.0)
MCH: 29.3 pg (ref 26.0–34.0)
MCHC: 32 g/dL (ref 30.0–36.0)
MCV: 91.6 fL (ref 78.0–100.0)
Monocytes Absolute: 0.8 K/uL (ref 0.1–1.0)
Monocytes Relative: 11 %
Neutro Abs: 5.2 K/uL (ref 1.7–7.7)
Neutrophils Relative %: 68 %
Platelets: 286 K/uL (ref 150–400)
RBC: 4.51 MIL/uL (ref 3.87–5.11)
RDW: 13.6 % (ref 11.5–15.5)
WBC: 7.6 K/uL (ref 4.0–10.5)

## 2015-10-03 LAB — COMPREHENSIVE METABOLIC PANEL WITH GFR
ALT: 232 U/L — ABNORMAL HIGH (ref 14–54)
AST: 421 U/L — ABNORMAL HIGH (ref 15–41)
Albumin: 3.8 g/dL (ref 3.5–5.0)
Alkaline Phosphatase: 77 U/L (ref 38–126)
Anion gap: 9 (ref 5–15)
BUN: 19 mg/dL (ref 6–20)
CO2: 25 mmol/L (ref 22–32)
Calcium: 9.5 mg/dL (ref 8.9–10.3)
Chloride: 105 mmol/L (ref 101–111)
Creatinine, Ser: 0.92 mg/dL (ref 0.44–1.00)
GFR calc Af Amer: >60 mL/min
GFR calc non Af Amer: >60 mL/min
Glucose, Bld: 111 mg/dL — ABNORMAL HIGH (ref 65–99)
Potassium: 4 mmol/L (ref 3.5–5.1)
Sodium: 139 mmol/L (ref 135–145)
Total Bilirubin: 1.2 mg/dL (ref 0.3–1.2)
Total Protein: 6.9 g/dL (ref 6.5–8.1)

## 2015-10-03 LAB — LIPASE, BLOOD: Lipase: 43 U/L (ref 11–51)

## 2015-10-03 MED ORDER — LORAZEPAM 2 MG/ML IJ SOLN
0.5000 mg | Freq: Once | INTRAMUSCULAR | Status: AC
  Administered 2015-10-03: 0.5 mg via INTRAVENOUS
  Filled 2015-10-03: qty 1

## 2015-10-03 MED ORDER — HYDROMORPHONE HCL 1 MG/ML IJ SOLN: 0.5000 mg | INTRAMUSCULAR | Status: DC | PRN

## 2015-10-03 MED ORDER — CLONAZEPAM 0.5 MG PO TABS
0.5000 mg | ORAL_TABLET | Freq: Every day | ORAL | Status: DC
  Administered 2015-10-04 – 2015-10-05 (×3): 0.5 mg via ORAL
  Filled 2015-10-03 (×3): qty 1

## 2015-10-03 MED ORDER — CARBIDOPA-LEVODOPA 25-100 MG PO TABS: 1.0000 | ORAL_TABLET | Freq: Three times a day (TID) | ORAL | Status: DC | PRN

## 2015-10-03 MED ORDER — ONDANSETRON HCL 4 MG PO TABS: 4.0000 mg | ORAL_TABLET | Freq: Four times a day (QID) | ORAL | Status: DC | PRN

## 2015-10-03 MED ORDER — ALUM & MAG HYDROXIDE-SIMETH 200-200-20 MG/5ML PO SUSP: 30.0000 mL | Freq: Four times a day (QID) | ORAL | Status: DC | PRN

## 2015-10-03 MED ORDER — SODIUM CHLORIDE 0.9 % IV BOLUS (SEPSIS)
1000.0000 mL | Freq: Once | INTRAVENOUS | Status: AC
  Administered 2015-10-03: 1000 mL via INTRAVENOUS

## 2015-10-03 MED ORDER — ACETAMINOPHEN 325 MG PO TABS: 650.0000 mg | ORAL_TABLET | Freq: Four times a day (QID) | ORAL | Status: DC | PRN

## 2015-10-03 MED ORDER — DOCUSATE SODIUM 100 MG PO CAPS
100.0000 mg | ORAL_CAPSULE | Freq: Two times a day (BID) | ORAL | Status: DC
Start: 2015-10-03 — End: 2015-10-06
  Administered 2015-10-04 – 2015-10-06 (×4): 100 mg via ORAL
  Filled 2015-10-03 (×5): qty 1

## 2015-10-03 MED ORDER — ONDANSETRON HCL 4 MG/2ML IJ SOLN
4.0000 mg | Freq: Four times a day (QID) | INTRAMUSCULAR | Status: DC | PRN
  Administered 2015-10-05: 4 mg via INTRAVENOUS
  Filled 2015-10-03: qty 2

## 2015-10-03 MED ORDER — ENOXAPARIN SODIUM 40 MG/0.4ML ~~LOC~~ SOLN
40.0000 mg | SUBCUTANEOUS | Status: DC
  Administered 2015-10-03: 40 mg via SUBCUTANEOUS
  Filled 2015-10-03: qty 0.4

## 2015-10-03 MED ORDER — ONDANSETRON HCL 4 MG/2ML IJ SOLN
4.0000 mg | Freq: Once | INTRAMUSCULAR | Status: AC
  Administered 2015-10-03: 4 mg via INTRAVENOUS
  Filled 2015-10-03: qty 2

## 2015-10-03 MED ORDER — ACETAMINOPHEN 650 MG RE SUPP: 650.0000 mg | Freq: Four times a day (QID) | RECTAL | Status: DC | PRN

## 2015-10-03 MED ORDER — CITALOPRAM HYDROBROMIDE 40 MG PO TABS
40.0000 mg | ORAL_TABLET | Freq: Every day | ORAL | Status: DC
  Administered 2015-10-04 – 2015-10-05 (×3): 40 mg via ORAL
  Filled 2015-10-03 (×3): qty 1

## 2015-10-03 MED ORDER — ZOLPIDEM TARTRATE 5 MG PO TABS
5.0000 mg | ORAL_TABLET | Freq: Every day | ORAL | Status: DC
  Filled 2015-10-03: qty 1

## 2015-10-03 MED ORDER — HYDROCODONE-ACETAMINOPHEN 5-325 MG PO TABS
1.0000 | ORAL_TABLET | ORAL | Status: DC | PRN
  Administered 2015-10-05: 2 via ORAL
  Filled 2015-10-03: qty 2

## 2015-10-03 MED ORDER — BUPROPION HCL ER (SR) 150 MG PO TB12
150.0000 mg | ORAL_TABLET | Freq: Every day | ORAL | Status: DC
  Administered 2015-10-05 – 2015-10-06 (×2): 150 mg via ORAL
  Filled 2015-10-03 (×3): qty 1

## 2015-10-03 MED ORDER — MORPHINE SULFATE (PF) 4 MG/ML IV SOLN
4.0000 mg | Freq: Once | INTRAVENOUS | Status: AC
  Administered 2015-10-03: 4 mg via INTRAVENOUS
  Filled 2015-10-03: qty 1

## 2015-10-03 MED ORDER — PANTOPRAZOLE SODIUM 40 MG IV SOLR
40.0000 mg | Freq: Once | INTRAVENOUS | Status: AC
  Administered 2015-10-03: 40 mg via INTRAVENOUS
  Filled 2015-10-03: qty 40

## 2015-10-03 MED ORDER — PRAMIPEXOLE DIHYDROCHLORIDE 0.25 MG PO TABS
0.5000 mg | ORAL_TABLET | Freq: Every day | ORAL | Status: DC
Start: 2015-10-03 — End: 2015-10-06
  Administered 2015-10-04 – 2015-10-05 (×3): 0.5 mg via ORAL
  Filled 2015-10-03 (×5): qty 2

## 2015-10-03 MED ORDER — SENNOSIDES-DOCUSATE SODIUM 8.6-50 MG PO TABS: 1.0000 | ORAL_TABLET | Freq: Every evening | ORAL | Status: DC | PRN

## 2015-10-03 MED ORDER — SODIUM CHLORIDE 0.9 % IV SOLN
INTRAVENOUS | Status: AC
  Administered 2015-10-03 – 2015-10-04 (×2): via INTRAVENOUS

## 2015-10-03 NOTE — Progress Notes (Signed)
New Admission Note:  Arrival Method: stretcher ED Mental Orientation: alert and oriented x4 Telemetry: none Assessment: Completed Skin: no skin issues IV: l AC Pain: 0 Tubes:0 Safety Measures: Safety Fall Prevention Plan was given, discussed and signed. Admission: Completed [facility] Orientation: Patient has been orientated to the room, unit and the staff. Family: non present  Orders have been reviewed and implemented. Will continue to monitor the patient. Call light has been placed within reach and bed alarm has been activated.   Fabian Sharp, RN  Phone Number: [phone]

## 2015-10-03 NOTE — ED Provider Notes (Signed)
Patient presented to the ER with abdominal pain. Patient reports persistent pain in the right upper abdomen that has been ongoing for 3 days. Pain worsened after eating pizza yesterday. She reports that she has had some intermittent episodes of right upper quadrant pain in the past but never this severe or for this long.  Face to face Exam: HEENT - PERRLA Lungs - CTAB Heart - RRR, no M/R/G Abd - nondistended, tender right upper quadrant Neuro - alert, oriented x3  Plan: Lab work reveals elevated LFTs. Ultrasound does not show any acute cholecystitis but there is cholelithiasis. Additionally, common bile duct is enlarged, suspect choledocholithiasis. Will require hospitalization for GI consultation and pain control.  Orpah Greek, MD 10/03/15 1036

## 2015-10-03 NOTE — ED Notes (Signed)
Attempted report 

## 2015-10-03 NOTE — ED Notes (Signed)
MD at bedside. 

## 2015-10-03 NOTE — H&P (Signed)
[facility] History and Physical                                                                                    Alexis Fox, is a 59 y.o. female  MRN: [id number]   DOB - [birth year]  Admit Date - 10/03/2015  Outpatient Primary MD for the patient is [name], MD  Referring Physician:  Arther Abbott, PA-C  Chief Complaint:   Chief Complaint  Patient presents with  . 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  The patient reports that she has had episodes of severe abdominal pain over the years but they would resolve on their own. This time her symptoms started on Friday while she was driving to the beach and had a meal at [employer]. The pain is severe, located in her epigastrium, radiates to her back, and is worse with eating. Nothing seems to improve the pain. Last night she had pizza and developed severe unrelenting pain. She is having nausea but denies any vomiting, her bowel movements are regular. She's had no dysuria. She denies any chest pain or difficulty breathing.  In the emergency department a right upper quadrant ultrasound showed small layering gallstones with a dilated CBD at 13 mm. Her transaminases are elevated, but total bilirubin and alkaline phosphatase are normal. Lipase is normal. The patient is currently afebrile with a normal white count.  Review of Systems  Constitutional: Negative.   HENT: Negative.   Eyes: Negative.   Respiratory: Negative.   Cardiovascular: Negative.   Gastrointestinal: Positive for nausea and abdominal pain.  Genitourinary: Negative.   Musculoskeletal: Negative.   Skin: Negative.   Neurological: Negative.   Endo/Heme/Allergies: Negative.   Psychiatric/Behavioral: Negative.      Past Medical History  Past Medical History  Diagnosis Date  . Arthritis     knees  .  Depression     Past Surgical History  Procedure Laterality Date  . Elbow surgery      right for epicondylitis 2010   . Bunionectomy    . Foot surgery      1983 -tarsal tunnel release  . Lumbar disc surgery  04/04/2012  . Tarsal tunnel release    . Spinal fusion  5/12    L5-S1     Social History Social History  Substance Use Topics  . Smoking status: Never Smoker   . Smokeless tobacco: Never Used  . Alcohol Use: No   lives at home with family, independent with ADLs.  Family History Family History  Problem Relation Age of Onset  . Anesthesia problems Neg Hx   . Heart disease Mother   . Pulmonary fibrosis Mother   . Hypertension Mother   . Cancer Father     astocytoma   denies any family history of gallbladder disease.  Prior to Admission medications   Medication Sig Start Date End Date Taking? Authorizing Provider  buPROPion (WELLBUTRIN SR) 150 MG 12 hr tablet Take 150 mg by mouth daily.   Yes Historical Provider,  MD  carbidopa-levodopa (SINEMET IR) 25-100 MG tablet Take 1 tablet by mouth 3 (three) times daily as needed. For restless leg   Yes Historical Provider, MD  citalopram (CELEXA) 40 MG tablet Take 40 mg by mouth at bedtime.   Yes Historical Provider, MD  clonazePAM (KLONOPIN) 1 MG tablet Take 0.5 mg by mouth at bedtime.    Yes Historical Provider, MD  diclofenac (VOLTAREN) 75 MG EC tablet Take 75 mg by mouth 2 (two) times daily as needed for mild pain.   Yes Historical Provider, MD  pramipexole (MIRAPEX) 0.5 MG tablet Take 0.5 mg by mouth at bedtime.   Yes Historical Provider, MD  zolpidem (AMBIEN) 5 MG tablet Take 5 mg by mouth at bedtime.   Yes Historical Provider, MD    No Known Allergies  Physical Exam  Vitals  Blood pressure 118/58, pulse 78, temperature 97.6 F (36.4 C), temperature source Oral, resp. rate 20, height 5' 4"  (1.626 m), weight 92.987 kg (205 lb), SpO2 100 %.   General: Well-developed, pleasant overweight female, lying in bed in  NAD  Psych:  Normal affect and insight, Not Suicidal or Homicidal, Awake Alert, Oriented X 3.  Neuro:   No F.N deficits, ALL C.Nerves Intact, Strength 5/5 all 4 extremities, Sensation intact all 4 extremities.  ENT:  Ears and Eyes appear Normal, Conjunctivae clear, PER. Moist oral mucosa without erythema or exudates.  Neck:  Supple, No lymphadenopathy appreciated  Respiratory:  Symmetrical chest wall movement, Good air movement bilaterally, CTAB.  Cardiac:  RRR, No Murmurs, no LE edema noted, no JVD.    Abdomen:  Positive bowel sounds, Soft, Non tender (after IV pain medication), Non distended,  No masses appreciated  Skin:  No Cyanosis, Normal Skin Turgor, No Skin Rash or Bruise.  Extremities:  Able to move all 4. 5/5 strength in each,  no effusions.  Data Review  Wt Readings from Last 3 Encounters:  10/03/15 92.987 kg (205 lb)  11/19/14 93.441 kg (206 lb)  06/24/13 90.719 kg (200 lb)    CBC  Recent Labs Lab 10/03/15 0825  WBC 7.6  HGB 13.2  HCT 41.3  PLT 286  MCV 91.6  MCH 29.3  MCHC 32.0  RDW 13.6  LYMPHSABS 1.5  MONOABS 0.8  EOSABS 0.1  BASOSABS 0.0    Chemistries   Recent Labs Lab 10/03/15 0825  NA 139  K 4.0  CL 105  CO2 25  GLUCOSE 111*  BUN 19  CREATININE 0.92  CALCIUM 9.5  AST 421*  ALT 232*  ALKPHOS 77  BILITOT 1.2    Urinalysis    Component Value Date/Time   COLORURINE AMBER* 10/03/2015 0929   APPEARANCEUR CLEAR 10/03/2015 0929   LABSPEC 1.020 10/03/2015 0929   PHURINE 7.0 10/03/2015 0929   GLUCOSEU NEGATIVE 10/03/2015 0929   HGBUR NEGATIVE 10/03/2015 0929   BILIRUBINUR NEGATIVE 10/03/2015 0929   KETONESUR NEGATIVE 10/03/2015 0929   PROTEINUR NEGATIVE 10/03/2015 0929   UROBILINOGEN 1.0 10/03/2015 0929   NITRITE NEGATIVE 10/03/2015 0929   LEUKOCYTESUR NEGATIVE 10/03/2015 0929    Imaging results:   US Abdomen Limited Ruq  10/03/2015  CLINICAL DATA:  Right upper quadrant abdominal pain. Nausea and vomiting. EXAM: US  ABDOMEN LIMITED - RIGHT UPPER QUADRANT COMPARISON:  08/27/2012 MRI abdomen. Reports from 01/30/2001 CT abdomen/ pelvis and 12/11/2000 abdominal sonogram (images not available). FINDINGS: Gallbladder: There are numerous layering mobile calcified subcentimeter gallstones in the nondistended gallbladder. No gallbladder wall thickening, pericholecystic fluid or sonographic Murphy's sign. Common  bile duct: Diameter: 13 mm There is intrahepatic biliary ductal dilatation. There is moderate common bile duct dilation. No choledocholithiasis is demonstrated in the visualized common bile duct, noting nonvisualization of the distal common bile duct due to overlying bowel gas. Liver: No focal lesion identified. Within normal limits in parenchymal echogenicity. IMPRESSION: 1. Cholelithiasis.  No evidence of acute cholecystitis. 2. New intrahepatic and common bile duct dilatation (13 mm common bile duct diameter). Although no choledocholithiasis is seen in the visualized common bile duct, the distal common bile duct cannot be visualized at sonography due to overlying bowel gas and an obstructing stone or mass cannot be excluded. Recommend further evaluation with MRI abdomen with and without intravenous contrast and MRCP. 3. Normal liver. Electronically Signed   By: Ilona Sorrel M.D.   On: 10/03/2015 10:13    My personal review of EKG:  Pending   Assessment & Plan  Principal Problem:   Symptomatic cholelithiasis Active Problems:   Restless leg syndrome   Transaminitis   Symptomatic cholelithiasis with CBD dilation [facility] has been consulted and has requested an MRCP. Gen. surgery has been consulted. The patient would like to have her gallbladder removed during this admission if possible. Will keep the patient nothing by mouth for MRCP. Then would recommend diet per gastroenterology. C met ordered for 11/1. Antibiotics not felt to be necessary at this time.  Restless leg syndrome Continue Mirapex,  carbidopa levodopa, and Klonopin   Consultants Called:    [facility], and general surgery  Family Communication:     Patient is alert, orientated and understands their plan of care.   Code Status:    Full  Condition:    Stable  Potential Disposition:   Likely to home in 3-4 days.  Time spent in minutes : [place],  [place] on 10/03/2015 at 12:05 PM Between 7am to 7pm - Pager - [phone] After 7pm go to [email or web address] - password [email or web address] And look for the night coverage person covering me after hours

## 2015-10-03 NOTE — ED Notes (Signed)
Pt. Presents with complaint of upper right quadrant pain since Friday. Pt. States pain worsened yesterday after eating pizza. Pt. States she has had pain before intermittently.

## 2015-10-03 NOTE — ED Notes (Signed)
Pt concern of BP, Pollina, MD and Martie Round, RN made aware

## 2015-10-03 NOTE — Progress Notes (Signed)
MRI called to get update on patient and said they were coming to get patient for MRI.  45 minutes later patient still had not went down.  Called MRI had an emergency advised that patient would still be going for MRI tonight.  Patinet remains NPO until after MRI then can have clear liquid diet until 0200 on 11/1

## 2015-10-03 NOTE — Consult Note (Signed)
[facility] Surgery Consult Note  Alexis Fox [birth year]  [id number].    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 The patient reports that she has had episodes of severe abdominal pain over the 2 years, but they would resolve on their own. This time her symptoms started on Friday while she was driving to the beach and had a meal at [employer].  She thinks the hush puppies and pizza she ate in the coming days made it worse.  Pain usually brought on by fried/greasy foods.  She was seen for this issue once 10 years ago, but never sought care in the last few years.  Pain is dull/achy severe abdominal pain (8/10) located in her epigastrium, radiates to her back.  No alleviating factors except pain meds here.  She is having nausea, but denies any vomiting.  No scleral icterus, acholic stools or dark urine.  She denies any CP/SOB, fever/chills.    In the emergency department a right upper quadrant ultrasound showed small layering gallstones with a dilated CBD at 13 mm. Her transaminases are elevated, but total bilirubin and alkaline phosphatase are normal. Lipase is normal.  WBC is normal.  The patient is currently afebrile with a normal white count.  No h/o abdominal surgery or hernias.    ROS: All systems reviewed and otherwise negative except for as above  Family History  Problem Relation Age of Onset  . Anesthesia problems Neg Hx   . Heart disease Mother   . Pulmonary fibrosis Mother   . Hypertension Mother   . Cancer Father     astocytoma    Past Medical History  Diagnosis Date  . Arthritis     knees  . Depression   . Restless leg syndrome     Past Surgical History  Procedure Laterality Date  . Elbow surgery      right for epicondylitis 2010   . Bunionectomy    .  Foot surgery      1983 -tarsal tunnel release  . Lumbar disc surgery  04/04/2012  . Tarsal tunnel release    . Spinal fusion  5/12    L5-S1    Social History:  reports that she has never smoked. She has never used smokeless tobacco. She reports that she does not drink alcohol or use illicit drugs.  Allergies: No Known Allergies   (Not in a hospital admission)  Blood pressure 118/58, pulse 78, temperature 97.6 F (36.4 C), temperature source Oral, resp. rate 20, height 5' 4"  (1.626 m), weight 92.987 kg (205 lb), SpO2 100 %. Physical Exam: General: pleasant, WD/WN white female who is laying in bed in NAD HEENT: head is normocephalic, atraumatic.  Sclera are noninjected.  PERRL.  Ears and nose without any masses or lesions.  Mouth is pink and moist Heart: regular, rate, and rhythm.  No obvious murmurs, gallops, or rubs noted.  Palpable pedal pulses bilaterally Lungs: CTAB, no wheezes, rhonchi, or rales noted.  Respiratory effort nonlabored Abd: Obese, soft, tender in RUQ and epigastrium, ND, +BS, no masses, hernias, or organomegaly, no scars MS: all 4 extremities are symmetrical with no cyanosis, clubbing, or edema. Skin: warm and dry with no masses, lesions, or rashes Psych: A&Ox3 with an appropriate affect.   Results for orders placed or performed during the hospital encounter of 10/03/15 (from  the past 48 hour(s))  CBC with Differential     Status: None   Collection Time: 10/03/15  8:25 AM  Result Value Ref Range   WBC 7.6 4.0 - 10.5 K/uL   RBC 4.51 3.87 - 5.11 MIL/uL   Hemoglobin 13.2 12.0 - 15.0 g/dL   HCT 41.3 36.0 - 46.0 %   MCV 91.6 78.0 - 100.0 fL   MCH 29.3 26.0 - 34.0 pg   MCHC 32.0 30.0 - 36.0 g/dL   RDW 13.6 11.5 - 15.5 %   Platelets 286 150 - 400 K/uL   Neutrophils Relative % 68 %   Neutro Abs 5.2 1.7 - 7.7 K/uL   Lymphocytes Relative 19 %   Lymphs Abs 1.5 0.7 - 4.0 K/uL   Monocytes Relative 11 %   Monocytes Absolute 0.8 0.1 - 1.0 K/uL   Eosinophils Relative 2  %   Eosinophils Absolute 0.1 0.0 - 0.7 K/uL   Basophils Relative 0 %   Basophils Absolute 0.0 0.0 - 0.1 K/uL  Comprehensive metabolic panel     Status: Abnormal   Collection Time: 10/03/15  8:25 AM  Result Value Ref Range   Sodium 139 135 - 145 mmol/L   Potassium 4.0 3.5 - 5.1 mmol/L   Chloride 105 101 - 111 mmol/L   CO2 25 22 - 32 mmol/L   Glucose, Bld 111 (H) 65 - 99 mg/dL   BUN 19 6 - 20 mg/dL   Creatinine, Ser 0.92 0.44 - 1.00 mg/dL   Calcium 9.5 8.9 - 10.3 mg/dL   Total Protein 6.9 6.5 - 8.1 g/dL   Albumin 3.8 3.5 - 5.0 g/dL   AST 421 (H) 15 - 41 U/L   ALT 232 (H) 14 - 54 U/L   Alkaline Phosphatase 77 38 - 126 U/L   Total Bilirubin 1.2 0.3 - 1.2 mg/dL   GFR calc non Af Amer >60 >60 mL/min   GFR calc Af Amer >60 >60 mL/min    Comment: (NOTE) The eGFR has been calculated using the CKD EPI equation. This calculation has not been validated in all clinical situations. eGFR's persistently <60 mL/min signify possible Chronic Kidney Disease.    Anion gap 9 5 - 15  Lipase, blood     Status: None   Collection Time: 10/03/15  8:25 AM  Result Value Ref Range   Lipase 43 11 - 51 U/L    Comment: Please note change in reference range.  Urinalysis, Routine w reflex microscopic (not at [facility])     Status: Abnormal   Collection Time: 10/03/15  9:29 AM  Result Value Ref Range   Color, Urine AMBER (A) YELLOW    Comment: BIOCHEMICALS MAY BE AFFECTED BY COLOR   APPearance CLEAR CLEAR   Specific Gravity, Urine 1.020 1.005 - 1.030   pH 7.0 5.0 - 8.0   Glucose, UA NEGATIVE NEGATIVE mg/dL   Hgb urine dipstick NEGATIVE NEGATIVE   Bilirubin Urine NEGATIVE NEGATIVE   Ketones, ur NEGATIVE NEGATIVE mg/dL   Protein, ur NEGATIVE NEGATIVE mg/dL   Urobilinogen, UA 1.0 0.0 - 1.0 mg/dL   Nitrite NEGATIVE NEGATIVE   Leukocytes, UA NEGATIVE NEGATIVE    Comment: MICROSCOPIC NOT DONE ON URINES WITH NEGATIVE PROTEIN, BLOOD, LEUKOCYTES, NITRITE, OR GLUCOSE <1000 mg/dL.   US Abdomen Limited  Ruq  10/03/2015  CLINICAL DATA:  Right upper quadrant abdominal pain. Nausea and vomiting. EXAM: US ABDOMEN LIMITED - RIGHT UPPER QUADRANT COMPARISON:  08/27/2012 MRI abdomen. Reports from 01/30/2001 CT abdomen/ pelvis  and 12/11/2000 abdominal sonogram (images not available). FINDINGS: Gallbladder: There are numerous layering mobile calcified subcentimeter gallstones in the nondistended gallbladder. No gallbladder wall thickening, pericholecystic fluid or sonographic Murphy's sign. Common bile duct: Diameter: 13 mm There is intrahepatic biliary ductal dilatation. There is moderate common bile duct dilation. No choledocholithiasis is demonstrated in the visualized common bile duct, noting nonvisualization of the distal common bile duct due to overlying bowel gas. Liver: No focal lesion identified. Within normal limits in parenchymal echogenicity. IMPRESSION: 1. Cholelithiasis.  No evidence of acute cholecystitis. 2. New intrahepatic and common bile duct dilatation (13 mm common bile duct diameter). Although no choledocholithiasis is seen in the visualized common bile duct, the distal common bile duct cannot be visualized at sonography due to overlying bowel gas and an obstructing stone or mass cannot be excluded. Recommend further evaluation with MRI abdomen with and without intravenous contrast and MRCP. 3. Normal liver. Electronically Signed   By: Ilona Sorrel M.D.   On: 10/03/2015 10:13     Assessment/Plan Symptomatic cholelithiasis ?Choledocholithiasis Transaminitis Obesity RLS  Plan: 1.  Admit to medicine, GI consult, we will consult 2.  NPO, bowel rest, IVF, pain control, antiemetics, no antibiotics at this time since no evidence of cholecystitis 3.  Encouraged Ambulation and IS 4.  GI following and recommended MRCP to see if stones are present 5.  No leukocytosis or hyperbilirubinemia.  Await results of MRI before deciding timing of surgery vs ERCP first 6.  Keep NPO after MN for possible ERCP  or lap chole   Nat Christen, [facility] Surgery 10/03/2015, 1:16 PM Pager: [phone]

## 2015-10-03 NOTE — ED Provider Notes (Addendum)
CSN: [id number]     Arrival date & time 10/03/15  1962 History   First MD Initiated Contact with Patient 10/03/15 [phone]     Chief Complaint  Patient presents with  . Abdominal Pain     (Consider location/radiation/quality/duration/timing/severity/associated sxs/prior Treatment) HPI   Alexis Fox is a 59 F with no significant pmhx who presents the emergency prompt today with sudden onset epigastric abdominal pain. Patient states she has had several "episodes" of similar pain over the last 3 years which she has not seen a provider for. However this time the pain has not resolved. Patient states typically this pain will resolve on its own within several hours. Patient states she ate a pizza last night and shortly after began having sharp epigastric abdominal pain that is radiating into her right upper quadrant and back. Pain is 10 out of 10. No alleviating factors. Denies fever, vomiting, diarrhea, melena, hematochezia. Denies alcohol use.   Past Medical History  Diagnosis Date  . Arthritis     knees  . Depression    Past Surgical History  Procedure Laterality Date  . Elbow surgery      right for epicondylitis 2010   . Bunionectomy    . Foot surgery      1983 -tarsal tunnel release  . Lumbar disc surgery  04/04/2012  . Tarsal tunnel release    . Spinal fusion  5/12    L5-S1   Family History  Problem Relation Age of Onset  . Anesthesia problems Neg Hx   . Heart disease Mother   . Pulmonary fibrosis Mother   . Hypertension Mother   . Cancer Father     astocytoma   Social History  Substance Use Topics  . Smoking status: Never Smoker   . Smokeless tobacco: Never Used  . Alcohol Use: No   OB History    Gravida Para Term Preterm AB TAB SAB Ectopic Multiple Living   3 2        2      Review of Systems  All other systems reviewed and are negative.     Allergies  Review of patient's allergies indicates no known allergies.  Home Medications   Prior to Admission  medications   Medication Sig Start Date End Date Taking? Authorizing Provider  buPROPion (WELLBUTRIN SR) 150 MG 12 hr tablet Take 150 mg by mouth daily.   Yes Historical Provider, MD  carbidopa-levodopa (SINEMET IR) 25-100 MG tablet Take 1 tablet by mouth 3 (three) times daily as needed. For restless leg   Yes Historical Provider, MD  citalopram (CELEXA) 40 MG tablet Take 40 mg by mouth at bedtime.   Yes Historical Provider, MD  clonazePAM (KLONOPIN) 1 MG tablet Take 0.5 mg by mouth at bedtime.    Yes Historical Provider, MD  diclofenac (VOLTAREN) 75 MG EC tablet Take 75 mg by mouth 2 (two) times daily as needed for mild pain.   Yes Historical Provider, MD  pramipexole (MIRAPEX) 0.5 MG tablet Take 0.5 mg by mouth at bedtime.   Yes Historical Provider, MD  zolpidem (AMBIEN) 5 MG tablet Take 5 mg by mouth at bedtime.   Yes Historical Provider, MD  clobetasol cream (TEMOVATE) 0.05 % Apply topically 2 (two) times daily. Patient not taking: Reported on 10/03/2015 06/24/13   Huel Cote, NP  Estradiol-Norethindrone Acet 0.5-0.1 MG per tablet Take 1 tablet by mouth daily. Patient not taking: Reported on 10/03/2015 11/19/14   Huel Cote, NP   BP 125/61  mmHg  Pulse 65  Temp(Src) 97.6 F (36.4 C) (Oral)  Resp 20  Ht 5\' 4"  (1.626 m)  Wt 205 lb (92.987 kg)  BMI 35.17 kg/m2  SpO2 100% Physical Exam  Constitutional: She is oriented to person, place, and time. She appears well-developed and well-nourished. She appears distressed.  Ill-appearing female lying in bed. Complaining of pain  HENT:  Head: Normocephalic and atraumatic.  Mouth/Throat: Oropharynx is clear and moist. No oropharyngeal exudate.  Eyes: Conjunctivae and EOM are normal. Pupils are equal, round, and reactive to light. Right eye exhibits no discharge. Left eye exhibits no discharge. No scleral icterus.  Neck: Normal range of motion. Neck supple.  Cardiovascular: Normal rate, regular rhythm, normal heart sounds and intact distal  pulses.  Exam reveals no gallop and no friction rub.   No murmur heard. Pulmonary/Chest: Effort normal and breath sounds normal. No respiratory distress. She has no wheezes. She has no rales. She exhibits no tenderness.  Abdominal: Soft. She exhibits no distension and no mass. There is tenderness. There is guarding.  Exquisite tenderness to palpation in epigastric and right upper quadrant region. Positive Murphy sign.  Musculoskeletal: Normal range of motion. She exhibits no edema.  Neurological: She is alert and oriented to person, place, and time. No cranial nerve deficit.  Strength 5/5 throughout. No sensory deficits.  No gait abnormality.  Skin: Skin is warm and dry. No rash noted. She is not diaphoretic. No erythema. No pallor.  Psychiatric: She has a normal mood and affect. Her behavior is normal.  Nursing note and vitals reviewed.   ED Course  Procedures (including critical care time) Labs Review Labs Reviewed  COMPREHENSIVE METABOLIC PANEL - Abnormal; Notable for the following:    Glucose, Bld 111 (*)    AST 421 (*)    ALT 232 (*)    All other components within normal limits  URINALYSIS, ROUTINE W REFLEX MICROSCOPIC (NOT AT [facility]) - Abnormal; Notable for the following:    Color, Urine AMBER (*)    All other components within normal limits  CBC WITH DIFFERENTIAL/PLATELET  LIPASE, BLOOD    Imaging Review US Abdomen Limited Ruq  10/03/2015  CLINICAL DATA:  Right upper quadrant abdominal pain. Nausea and vomiting. EXAM: US ABDOMEN LIMITED - RIGHT UPPER QUADRANT COMPARISON:  08/27/2012 MRI abdomen. Reports from 01/30/2001 CT abdomen/ pelvis and 12/11/2000 abdominal sonogram (images not available). FINDINGS: Gallbladder: There are numerous layering mobile calcified subcentimeter gallstones in the nondistended gallbladder. No gallbladder wall thickening, pericholecystic fluid or sonographic Murphy's sign. Common bile duct: Diameter: 13 mm There is intrahepatic biliary ductal  dilatation. There is moderate common bile duct dilation. No choledocholithiasis is demonstrated in the visualized common bile duct, noting nonvisualization of the distal common bile duct due to overlying bowel gas. Liver: No focal lesion identified. Within normal limits in parenchymal echogenicity. IMPRESSION: 1. Cholelithiasis.  No evidence of acute cholecystitis. 2. New intrahepatic and common bile duct dilatation (13 mm common bile duct diameter). Although no choledocholithiasis is seen in the visualized common bile duct, the distal common bile duct cannot be visualized at sonography due to overlying bowel gas and an obstructing stone or mass cannot be excluded. Recommend further evaluation with MRI abdomen with and without intravenous contrast and MRCP. 3. Normal liver. Electronically Signed   By: Ilona Sorrel M.D.   On: 10/03/2015 10:13   I have personally reviewed and evaluated these images and lab results as part of my medical decision-making.   EKG Interpretation None  MDM   Final diagnoses:  RUQ abdominal pain    59 year old otherwise healthy female patient presents with acute onset epigastric abdominal pain now radiating to her right upper quadrant with her back. Pain occurred after eating a large pizza last night. Denies fever, vomiting, alcohol use. Exquisitely tender to palpation upon abdominal exam. Afebrile.  Found to have isolated transaminitis. AST 421. ALT 232. CBC within normal limits. Lipase normal. Right upper quadrant ultrasound ordered. Reveals cholelithiasis, new intrahepatic and CBD dilatation to 13 mm. No choledocholithiasis however distal CBD cannot be visualized. Obstruction cannot be ruled out. Normal liver.  MRI abdomen and MRCP recommended.  Patient given IV fluids, pain medication, nausea medication. Symptomatically improved.  Spoke with Dr. Dillard Cannon enterology. Recommend admission to hospital. He will consult patient on the floor.  11:45 AM Spoke  with Bryson Ha hospitalist. Will admit patient to [facility]. Vital signs stable.   Dondra Spry [place], PA-C 10/03/15 [place], MD 10/03/15 [place], PA-C 10/03/15 1145  Orpah Greek, MD 10/03/15 1151

## 2015-10-03 NOTE — Consult Note (Signed)
Subjective:   HPI  The patient is a 59 year old female who was admitted to the hospital because of severe upper abdominal pain. Pain is located in the epigastrium and right upper quadrant. She has been having periodic episodes of upper abdominal pain for several years which would resolve spontaneously. In the emergency room she had an abdominal ultrasound which showed gallstones. Her common bile duct was dilated to 13 mm. Her AST is 421 and ALT 232. She has been seen by surgery in regards to gallstones.  Review of Systems No chest pain or shortness of breath  Past Medical History  Diagnosis Date  . Arthritis     knees  . Depression   . Restless leg syndrome    Past Surgical History  Procedure Laterality Date  . Elbow surgery      right for epicondylitis 2010   . Bunionectomy    . Foot surgery      1983 -tarsal tunnel release  . Lumbar disc surgery  04/04/2012  . Tarsal tunnel release    . Spinal fusion  5/12    L5-S1   Social History   Social History  . Marital Status: Married    Spouse Name: N/A  . Number of Children: N/A  . Years of Education: N/A   Occupational History  . Not on file.   Social History Main Topics  . Smoking status: Never Smoker   . Smokeless tobacco: Never Used  . Alcohol Use: No  . Drug Use: No  . Sexual Activity: Yes    Birth Control/ Protection: Post-menopausal   Other Topics Concern  . Not on file   Social History Narrative   family history includes Cancer in her father; Heart disease in her mother; Hypertension in her mother; Pulmonary fibrosis in her mother. There is no history of Anesthesia problems.  Current facility-administered medications:  .  HYDROmorphone (DILAUDID) injection 0.5-1 mg, 0.5-1 mg, Intravenous, Q4H PRN, Melton Alar, PA-C .  LORazepam (ATIVAN) injection 0.5 mg, 0.5 mg, Intravenous, Once, Melton Alar, PA-C, Stopped at 10/03/15 1323  Current outpatient prescriptions:  .  buPROPion (WELLBUTRIN SR) 150 MG 12 hr  tablet, Take 150 mg by mouth daily., Disp: , Rfl:  .  carbidopa-levodopa (SINEMET IR) 25-100 MG tablet, Take 1 tablet by mouth 3 (three) times daily as needed. For restless leg, Disp: , Rfl:  .  citalopram (CELEXA) 40 MG tablet, Take 40 mg by mouth at bedtime., Disp: , Rfl:  .  clonazePAM (KLONOPIN) 1 MG tablet, Take 0.5 mg by mouth at bedtime. , Disp: , Rfl:  .  diclofenac (VOLTAREN) 75 MG EC tablet, Take 75 mg by mouth 2 (two) times daily as needed for mild pain., Disp: , Rfl:  .  pramipexole (MIRAPEX) 0.5 MG tablet, Take 0.5 mg by mouth at bedtime., Disp: , Rfl:  .  zolpidem (AMBIEN) 5 MG tablet, Take 5 mg by mouth at bedtime., Disp: , Rfl:  No Known Allergies   Objective:     BP 116/66 mmHg  Pulse 70  Temp(Src) 98.6 F (37 C) (Oral)  Resp 20  Ht 5\' 4"  (1.626 m)  Wt 92.987 kg (205 lb)  BMI 35.17 kg/m2  SpO2 93%  She is alert  No acute distress  Heart rate and rhythm no murmurs  Lungs clear  Abdomen: Bowel sounds present, soft, tenderness in the epigastrium  Laboratory No components found for: D1    Assessment:     Symptomatic cholelithiasis, rule out choledocholithiasis  Plan:     Admit to the hospital. We will obtain a MRCP to see whether or not she has a CBD stone or not. Follow LFTs. Symptomatic pain control.

## 2015-10-04 ENCOUNTER — Encounter (HOSPITAL_COMMUNITY): Payer: Self-pay | Admitting: Anesthesiology

## 2015-10-04 ENCOUNTER — Encounter (HOSPITAL_COMMUNITY)
Admission: EM | Disposition: A | Discharge status: Home / Self Care | Payer: Self-pay | Source: Home / Self Care | Attending: Internal Medicine

## 2015-10-04 ENCOUNTER — Inpatient Hospital Stay (HOSPITAL_COMMUNITY): Payer: 59 | Admitting: Anesthesiology

## 2015-10-04 ENCOUNTER — Inpatient Hospital Stay (HOSPITAL_COMMUNITY): Payer: 59

## 2015-10-04 HISTORY — PX: CHOLECYSTECTOMY: SHX55

## 2015-10-04 LAB — CBC
HCT: 37.3 % (ref 36.0–46.0)
Hemoglobin: 11.9 g/dL — ABNORMAL LOW (ref 12.0–15.0)
MCH: 29.7 pg (ref 26.0–34.0)
MCHC: 31.9 g/dL (ref 30.0–36.0)
MCV: 93 fL (ref 78.0–100.0)
Platelets: 240 10*3/uL (ref 150–400)
RBC: 4.01 MIL/uL (ref 3.87–5.11)
RDW: 13.9 % (ref 11.5–15.5)
WBC: 5.1 10*3/uL (ref 4.0–10.5)

## 2015-10-04 LAB — COMPREHENSIVE METABOLIC PANEL
ALT: 526 U/L — ABNORMAL HIGH (ref 14–54)
AST: 473 U/L — ABNORMAL HIGH (ref 15–41)
Albumin: 3.3 g/dL — ABNORMAL LOW (ref 3.5–5.0)
Alkaline Phosphatase: 123 U/L (ref 38–126)
Anion gap: 6 (ref 5–15)
BUN: 7 mg/dL (ref 6–20)
CO2: 27 mmol/L (ref 22–32)
Calcium: 8.7 mg/dL — ABNORMAL LOW (ref 8.9–10.3)
Chloride: 104 mmol/L (ref 101–111)
Creatinine, Ser: 0.91 mg/dL (ref 0.44–1.00)
GFR calc Af Amer: >60 mL/min (ref >60)
GFR calc non Af Amer: >60 mL/min (ref >60)
Glucose, Bld: 105 mg/dL — ABNORMAL HIGH (ref 65–99)
Potassium: 4.1 mmol/L (ref 3.5–5.1)
Sodium: 137 mmol/L (ref 135–145)
Total Bilirubin: 2 mg/dL — ABNORMAL HIGH (ref 0.3–1.2)
Total Protein: 5.9 g/dL — ABNORMAL LOW (ref 6.5–8.1)

## 2015-10-04 LAB — SURGICAL PCR SCREEN
MRSA, PCR: NEGATIVE
Staphylococcus aureus: NEGATIVE

## 2015-10-04 SURGERY — LAPAROSCOPIC CHOLECYSTECTOMY WITH INTRAOPERATIVE CHOLANGIOGRAM
Anesthesia: General | Site: Abdomen

## 2015-10-04 MED ORDER — PROMETHAZINE HCL 25 MG/ML IJ SOLN
INTRAMUSCULAR | Status: AC
  Filled 2015-10-04: qty 1

## 2015-10-04 MED ORDER — SODIUM CHLORIDE 0.9 % IV SOLN
INTRAVENOUS | Status: DC
  Administered 2015-10-04 (×2): via INTRAVENOUS

## 2015-10-04 MED ORDER — ENOXAPARIN SODIUM 40 MG/0.4ML ~~LOC~~ SOLN: 40.0000 mg | SUBCUTANEOUS | Status: DC

## 2015-10-04 MED ORDER — ONDANSETRON HCL 4 MG/2ML IJ SOLN
INTRAMUSCULAR | Status: DC | PRN
  Administered 2015-10-04: 4 mg via INTRAVENOUS

## 2015-10-04 MED ORDER — VECURONIUM BROMIDE 10 MG IV SOLR
INTRAVENOUS | Status: AC
  Filled 2015-10-04: qty 10

## 2015-10-04 MED ORDER — NEOSTIGMINE METHYLSULFATE 10 MG/10ML IV SOLN
INTRAVENOUS | Status: DC | PRN
  Administered 2015-10-04: 5 mg via INTRAVENOUS

## 2015-10-04 MED ORDER — ARTIFICIAL TEARS OP OINT
TOPICAL_OINTMENT | OPHTHALMIC | Status: DC | PRN
Start: 2015-10-04 — End: 2015-10-04
  Administered 2015-10-04: 1 via OPHTHALMIC

## 2015-10-04 MED ORDER — PROMETHAZINE HCL 25 MG/ML IJ SOLN
6.2500 mg | INTRAMUSCULAR | Status: DC | PRN
Start: 2015-10-04 — End: 2015-10-04
  Administered 2015-10-04: 6.25 mg via INTRAVENOUS

## 2015-10-04 MED ORDER — SODIUM CHLORIDE 0.9 % IV SOLN
INTRAVENOUS | Status: DC | PRN
  Administered 2015-10-04: 10 mL

## 2015-10-04 MED ORDER — ONDANSETRON HCL 4 MG/2ML IJ SOLN
INTRAMUSCULAR | Status: AC
  Filled 2015-10-04: qty 2

## 2015-10-04 MED ORDER — GLYCOPYRROLATE 0.2 MG/ML IJ SOLN
INTRAMUSCULAR | Status: DC | PRN
  Administered 2015-10-04: 0.6 mg via INTRAVENOUS

## 2015-10-04 MED ORDER — LACTATED RINGERS IV SOLN: INTRAVENOUS | Status: DC | PRN

## 2015-10-04 MED ORDER — MEPERIDINE HCL 25 MG/ML IJ SOLN: 6.2500 mg | INTRAMUSCULAR | Status: DC | PRN

## 2015-10-04 MED ORDER — BUPIVACAINE-EPINEPHRINE 0.25% -1:200000 IJ SOLN
INTRAMUSCULAR | Status: DC | PRN
Start: 2015-10-04 — End: 2015-10-04
  Administered 2015-10-04 (×2): 30 mL

## 2015-10-04 MED ORDER — ROCURONIUM BROMIDE 50 MG/5ML IV SOLN
INTRAVENOUS | Status: AC
  Filled 2015-10-04: qty 1

## 2015-10-04 MED ORDER — ARTIFICIAL TEARS OP OINT
TOPICAL_OINTMENT | OPHTHALMIC | Status: AC
  Filled 2015-10-04: qty 3.5

## 2015-10-04 MED ORDER — LIDOCAINE HCL (CARDIAC) 20 MG/ML IV SOLN
INTRAVENOUS | Status: AC
  Filled 2015-10-04: qty 5

## 2015-10-04 MED ORDER — PROPOFOL 10 MG/ML IV BOLUS
INTRAVENOUS | Status: AC
  Filled 2015-10-04: qty 20

## 2015-10-04 MED ORDER — MIDAZOLAM HCL 5 MG/5ML IJ SOLN
INTRAMUSCULAR | Status: DC | PRN
  Administered 2015-10-04: 2 mg via INTRAVENOUS

## 2015-10-04 MED ORDER — HYDROMORPHONE HCL 1 MG/ML IJ SOLN
INTRAMUSCULAR | Status: AC
  Administered 2015-10-04: 1 mg
  Filled 2015-10-04: qty 1

## 2015-10-04 MED ORDER — SUCCINYLCHOLINE CHLORIDE 20 MG/ML IJ SOLN
INTRAMUSCULAR | Status: DC | PRN
  Administered 2015-10-04: 100 mg via INTRAVENOUS

## 2015-10-04 MED ORDER — HYDROMORPHONE HCL 1 MG/ML IJ SOLN
0.5000 mg | INTRAMUSCULAR | Status: DC | PRN
  Administered 2015-10-04 – 2015-10-05 (×2): 1.5 mg via INTRAVENOUS
  Filled 2015-10-04: qty 1
  Filled 2015-10-04 (×2): qty 2

## 2015-10-04 MED ORDER — SODIUM CHLORIDE 0.9 % IJ SOLN
INTRAMUSCULAR | Status: AC
Start: 2015-10-04 — End: 2015-10-04
  Filled 2015-10-04: qty 10

## 2015-10-04 MED ORDER — EPHEDRINE SULFATE 50 MG/ML IJ SOLN
INTRAMUSCULAR | Status: AC
  Filled 2015-10-04: qty 1

## 2015-10-04 MED ORDER — FENTANYL CITRATE (PF) 250 MCG/5ML IJ SOLN
INTRAMUSCULAR | Status: AC
  Filled 2015-10-04: qty 5

## 2015-10-04 MED ORDER — MIDAZOLAM HCL 2 MG/2ML IJ SOLN: 0.5000 mg | Freq: Once | INTRAMUSCULAR | Status: DC | PRN

## 2015-10-04 MED ORDER — SODIUM CHLORIDE 0.9 % IR SOLN
Status: DC | PRN
  Administered 2015-10-04: 1000 mL

## 2015-10-04 MED ORDER — PROPOFOL 10 MG/ML IV BOLUS
INTRAVENOUS | Status: DC | PRN
  Administered 2015-10-04: 150 mg via INTRAVENOUS

## 2015-10-04 MED ORDER — SUCCINYLCHOLINE CHLORIDE 20 MG/ML IJ SOLN
INTRAMUSCULAR | Status: AC
  Filled 2015-10-04: qty 1

## 2015-10-04 MED ORDER — FENTANYL CITRATE (PF) 100 MCG/2ML IJ SOLN
INTRAMUSCULAR | Status: DC | PRN
  Administered 2015-10-04: 50 ug via INTRAVENOUS
  Administered 2015-10-04: 200 ug via INTRAVENOUS

## 2015-10-04 MED ORDER — DEXTROSE 5 % IV SOLN
2.0000 g | INTRAVENOUS | Status: AC
  Administered 2015-10-04: 2 g via INTRAVENOUS
  Filled 2015-10-04 (×2): qty 2

## 2015-10-04 MED ORDER — HYDROMORPHONE HCL 1 MG/ML IJ SOLN
0.2500 mg | INTRAMUSCULAR | Status: DC | PRN
  Administered 2015-10-04 (×2): 0.5 mg via INTRAVENOUS

## 2015-10-04 MED ORDER — LIDOCAINE HCL (CARDIAC) 20 MG/ML IV SOLN
INTRAVENOUS | Status: DC | PRN
  Administered 2015-10-04: 20 mg via INTRAVENOUS

## 2015-10-04 MED ORDER — MIDAZOLAM HCL 2 MG/2ML IJ SOLN
INTRAMUSCULAR | Status: AC
  Filled 2015-10-04: qty 4

## 2015-10-04 MED ORDER — EPHEDRINE SULFATE 50 MG/ML IJ SOLN
INTRAMUSCULAR | Status: DC | PRN
  Administered 2015-10-04: 5 mg via INTRAVENOUS
  Administered 2015-10-04: 10 mg via INTRAVENOUS

## 2015-10-04 MED ORDER — PHENYLEPHRINE 40 MCG/ML (10ML) SYRINGE FOR IV PUSH (FOR BLOOD PRESSURE SUPPORT)
PREFILLED_SYRINGE | INTRAVENOUS | Status: AC
  Filled 2015-10-04: qty 10

## 2015-10-04 MED ORDER — 0.9 % SODIUM CHLORIDE (POUR BTL) OPTIME
TOPICAL | Status: DC | PRN
  Administered 2015-10-04: 1000 mL

## 2015-10-04 MED ORDER — ROCURONIUM BROMIDE 100 MG/10ML IV SOLN
INTRAVENOUS | Status: DC | PRN
  Administered 2015-10-04: 30 mg via INTRAVENOUS

## 2015-10-04 SURGICAL SUPPLY — 44 items
APPLIER CLIP 5 13 M/L LIGAMAX5 (MISCELLANEOUS) ×2
BANDAGE ADH SHEER 1  50/CT (GAUZE/BANDAGES/DRESSINGS) ×6 IMPLANT
BENZOIN TINCTURE PRP APPL 2/3 (GAUZE/BANDAGES/DRESSINGS) ×2 IMPLANT
CANISTER SUCTION 2500CC (MISCELLANEOUS) ×2 IMPLANT
CHLORAPREP W/TINT 26ML (MISCELLANEOUS) ×2 IMPLANT
CLIP APPLIE 5 13 M/L LIGAMAX5 (MISCELLANEOUS) ×1 IMPLANT
COVER MAYO STAND STRL (DRAPES) ×2 IMPLANT
COVER SURGICAL LIGHT HANDLE (MISCELLANEOUS) ×2 IMPLANT
DRAPE C-ARM 42X72 X-RAY (DRAPES) ×2 IMPLANT
DRSG TEGADERM 4X4.75 (GAUZE/BANDAGES/DRESSINGS) ×2 IMPLANT
ELECT REM PT RETURN 9FT ADLT (ELECTROSURGICAL) ×2
ELECTRODE REM PT RTRN 9FT ADLT (ELECTROSURGICAL) ×1 IMPLANT
GAUZE SPONGE 2X2 8PLY STRL LF (GAUZE/BANDAGES/DRESSINGS) ×1 IMPLANT
GLOVE BIOGEL M STRL SZ7.5 (GLOVE) ×2 IMPLANT
GLOVE BIOGEL PI IND STRL 7.0 (GLOVE) ×3 IMPLANT
GLOVE BIOGEL PI IND STRL 8 (GLOVE) ×1 IMPLANT
GLOVE BIOGEL PI INDICATOR 7.0 (GLOVE) ×3
GLOVE BIOGEL PI INDICATOR 8 (GLOVE) ×1
GLOVE SURG SS PI 7.0 STRL IVOR (GLOVE) ×6 IMPLANT
GOWN STRL REUS W/ TWL LRG LVL3 (GOWN DISPOSABLE) ×3 IMPLANT
GOWN STRL REUS W/ TWL XL LVL3 (GOWN DISPOSABLE) ×1 IMPLANT
GOWN STRL REUS W/TWL LRG LVL3 (GOWN DISPOSABLE) ×3
GOWN STRL REUS W/TWL XL LVL3 (GOWN DISPOSABLE) ×1
KIT BASIN OR (CUSTOM PROCEDURE TRAY) ×2 IMPLANT
KIT ROOM TURNOVER OR (KITS) ×2 IMPLANT
NS IRRIG 1000ML POUR BTL (IV SOLUTION) ×2 IMPLANT
PAD ARMBOARD 7.5X6 YLW CONV (MISCELLANEOUS) ×2 IMPLANT
POUCH RETRIEVAL ECOSAC 10 (ENDOMECHANICALS) ×1 IMPLANT
POUCH RETRIEVAL ECOSAC 10MM (ENDOMECHANICALS) ×1
SCISSORS LAP 5X35 DISP (ENDOMECHANICALS) ×2 IMPLANT
SET CHOLANGIOGRAPH 5 50 .035 (SET/KITS/TRAYS/PACK) ×2 IMPLANT
SET IRRIG TUBING LAPAROSCOPIC (IRRIGATION / IRRIGATOR) ×2 IMPLANT
SLEEVE ENDOPATH XCEL 5M (ENDOMECHANICALS) ×4 IMPLANT
SPECIMEN JAR SMALL (MISCELLANEOUS) ×2 IMPLANT
SPONGE GAUZE 2X2 STER 10/PKG (GAUZE/BANDAGES/DRESSINGS) ×1
STRIP CLOSURE SKIN 1/2X4 (GAUZE/BANDAGES/DRESSINGS) ×2 IMPLANT
SUT MNCRL AB 4-0 PS2 18 (SUTURE) ×2 IMPLANT
SUT VICRYL 0 UR6 27IN ABS (SUTURE) ×2 IMPLANT
TOWEL OR 17X24 6PK STRL BLUE (TOWEL DISPOSABLE) ×2 IMPLANT
TOWEL OR 17X26 10 PK STRL BLUE (TOWEL DISPOSABLE) ×2 IMPLANT
TRAY LAPAROSCOPIC MC (CUSTOM PROCEDURE TRAY) ×2 IMPLANT
TROCAR XCEL BLUNT TIP 100MML (ENDOMECHANICALS) ×2 IMPLANT
TROCAR XCEL NON-BLD 5MMX100MML (ENDOMECHANICALS) ×2 IMPLANT
TUBING INSUFFLATION (TUBING) ×2 IMPLANT

## 2015-10-04 NOTE — Transfer of Care (Signed)
Immediate Anesthesia Transfer of Care Note  Patient: Alexis Fox  Procedure(s) Performed: Procedure(s): LAPAROSCOPIC CHOLECYSTECTOMY WITH INTRAOPERATIVE CHOLANGIOGRAM (N/A)  Patient Location: PACU  Anesthesia Type:General  Level of Consciousness: awake, oriented, sedated, patient cooperative and responds to stimulation  Airway & Oxygen Therapy: Patient Spontanous Breathing and Patient connected to nasal cannula oxygen  Post-op Assessment: Report given to RN, Post -op Vital signs reviewed and stable, Patient moving all extremities and Patient moving all extremities X 4  Post vital signs: Reviewed and stable  Last Vitals:  Filed Vitals:   10/04/15 0513  BP: 103/48  Pulse: 63  Temp: 36.8 C  Resp: 16    Complications: No apparent anesthesia complications

## 2015-10-04 NOTE — Anesthesia Postprocedure Evaluation (Signed)
  Anesthesia Post-op Note  Patient: Alexis Fox  Procedure(s) Performed: Procedure(s): LAPAROSCOPIC CHOLECYSTECTOMY WITH INTRAOPERATIVE CHOLANGIOGRAM (N/A)  Patient Location: PACU  Anesthesia Type:General  Level of Consciousness: awake, alert , oriented and patient cooperative  Airway and Oxygen Therapy: Patient Spontanous Breathing  Post-op Pain: mild  Post-op Assessment: Post-op Vital signs reviewed, Patient's Cardiovascular Status Stable, Respiratory Function Stable, Patent Airway, No signs of Nausea or vomiting and Pain level controlled              Post-op Vital Signs: Reviewed and stable  Last Vitals:  Filed Vitals:   10/04/15 1158  BP: 110/55  Pulse: 62  Temp: 37 C  Resp: 8    Complications: No apparent anesthesia complications

## 2015-10-04 NOTE — Progress Notes (Signed)
Patient is back from the PACU.  Patient is resting comfortably and is not expressing any concerns yet.  Patient is stable

## 2015-10-04 NOTE — Progress Notes (Signed)
Day of Surgery  Subjective: No c/o. Mri done.   Objective: Vital signs in last 24 hours: Temp:  [98.2 F (36.8 C)-98.6 F (37 C)] 98.2 F (36.8 C) (11/01 0513) Pulse Rate:  [57-78] 63 (11/01 0513) Resp:  [16-20] 16 (11/01 0513) BP: (100-118)/(41-66) 103/48 mmHg (11/01 0513) SpO2:  [93 %-100 %] 95 % (11/01 0513) Last BM Date: 10/02/15  Intake/Output from previous day: 10/31 0701 - 11/01 0700 In: 900 [I.V.:900] Out: 1000 [Urine:1000] Intake/Output this shift:    Alert, nad Soft, nt, nd  Lab Results:   Recent Labs  10/03/15 0825 10/04/15 0548  WBC 7.6 5.1  HGB 13.2 11.9*  HCT 41.3 37.3  PLT 286 240   BMET  Recent Labs  10/03/15 0825 10/04/15 0548  NA 139 137  K 4.0 4.1  CL 105 104  CO2 25 27  GLUCOSE 111* 105*  BUN 19 7  CREATININE 0.92 0.91  CALCIUM 9.5 8.7*   PT/INR No results for input(s): LABPROT, INR in the last 72 hours. ABG No results for input(s): PHART, HCO3 in the last 72 hours.  Invalid input(s): PCO2, PO2  Studies/Results: Mr Abdomen Mrcp Wo Cm  10/04/2015  CLINICAL DATA:  Severe epigastric and right upper quadrant abdominal pain. Cholelithiasis and biliary ductal dilatation seen on recent ultrasound. Elevated liver function tests. EXAM: MRI ABDOMEN WITHOUT CONTRAST  (INCLUDING MRCP) TECHNIQUE: Multiplanar multisequence MR imaging of the abdomen was performed. Heavily T2-weighted images of the biliary and pancreatic ducts were obtained, and three-dimensional MRCP images were rendered by post processing. COMPARISON:  Ultrasound on 10/03/2015 FINDINGS: Lower chest: No acute findings. Hepatobiliary: No liver masses identified. Numerous tiny gallstones are seen, without evidence of acute cholecystitis. The common bile duct measures 8 mm proximally, with smooth tapering seen distally. No evidence of choledocholithiasis. Pancreas: No mass or inflammatory process visualized on this unenhanced exam. No evidence of pancreatic ductal dilatation. Spleen:   Within normal limits in size. Adrenal Glands/Kidneys: No adrenal mass identified. No evidence of renal mass or hydronephrosis. Tiny right renal cyst noted. Stomach/Bowel/Peritoneum:  Unremarkable. Vascular/Lymphatic: No pathologically enlarged lymph nodes identified. No other significant abnormality identified. Other:  None. Musculoskeletal:  No suspicious bone lesions identified. IMPRESSION: Cholelithiasis, without evidence of acute cholecystitis. Mild dilatation of common bile duct measuring 8 mm. No radiographic evidence of choledocholithiasis or other obstructing etiology. Electronically Signed   By: [name] M.D.   On: 10/04/2015 07:29   Mr 3d Recon At Scanner  10/04/2015  CLINICAL DATA:  Severe epigastric and right upper quadrant abdominal pain. Cholelithiasis and biliary ductal dilatation seen on recent ultrasound. Elevated liver function tests. EXAM: MRI ABDOMEN WITHOUT CONTRAST  (INCLUDING MRCP) TECHNIQUE: Multiplanar multisequence MR imaging of the abdomen was performed. Heavily T2-weighted images of the biliary and pancreatic ducts were obtained, and three-dimensional MRCP images were rendered by post processing. COMPARISON:  Ultrasound on 10/03/2015 FINDINGS: Lower chest: No acute findings. Hepatobiliary: No liver masses identified. Numerous tiny gallstones are seen, without evidence of acute cholecystitis. The common bile duct measures 8 mm proximally, with smooth tapering seen distally. No evidence of choledocholithiasis. Pancreas: No mass or inflammatory process visualized on this unenhanced exam. No evidence of pancreatic ductal dilatation. Spleen:  Within normal limits in size. Adrenal Glands/Kidneys: No adrenal mass identified. No evidence of renal mass or hydronephrosis. Tiny right renal cyst noted. Stomach/Bowel/Peritoneum:  Unremarkable. Vascular/Lymphatic: No pathologically enlarged lymph nodes identified. No other significant abnormality identified. Other:  None. Musculoskeletal:  No  suspicious bone lesions identified. IMPRESSION:  Cholelithiasis, without evidence of acute cholecystitis. Mild dilatation of common bile duct measuring 8 mm. No radiographic evidence of choledocholithiasis or other obstructing etiology. Electronically Signed   By: [name] M.D.   On: 10/04/2015 07:29   US Abdomen Limited Ruq  10/03/2015  CLINICAL DATA:  Right upper quadrant abdominal pain. Nausea and vomiting. EXAM: US ABDOMEN LIMITED - RIGHT UPPER QUADRANT COMPARISON:  08/27/2012 MRI abdomen. Reports from 01/30/2001 CT abdomen/ pelvis and 12/11/2000 abdominal sonogram (images not available). FINDINGS: Gallbladder: There are numerous layering mobile calcified subcentimeter gallstones in the nondistended gallbladder. No gallbladder wall thickening, pericholecystic fluid or sonographic Murphy's sign. Common bile duct: Diameter: 13 mm There is intrahepatic biliary ductal dilatation. There is moderate common bile duct dilation. No choledocholithiasis is demonstrated in the visualized common bile duct, noting nonvisualization of the distal common bile duct due to overlying bowel gas. Liver: No focal lesion identified. Within normal limits in parenchymal echogenicity. IMPRESSION: 1. Cholelithiasis.  No evidence of acute cholecystitis. 2. New intrahepatic and common bile duct dilatation (13 mm common bile duct diameter). Although no choledocholithiasis is seen in the visualized common bile duct, the distal common bile duct cannot be visualized at sonography due to overlying bowel gas and an obstructing stone or mass cannot be excluded. Recommend further evaluation with MRI abdomen with and without intravenous contrast and MRCP. 3. Normal liver. Electronically Signed   By: Ilona Sorrel M.D.   On: 10/03/2015 10:13    Anti-infectives: Anti-infectives    Start     Dose/Rate Route Frequency Ordered Stop   10/04/15 0800  cefTRIAXone (ROCEPHIN) 2 g in dextrose 5 % 50 mL IVPB     2 g 100 mL/hr over 30 Minutes  Intravenous On call to O.R. 10/04/15 0758 10/05/15 0559      Assessment/Plan: Symptomatic cholelithiasis Elevated LFTs  MRI negative for cbd however LFTs up further today. There is still suspicion for CBD; however, given negative MRCP rec proceeding with cholecystectomy with ioc +/- postop ercp if needed. Pt amenable to plan. GI agrees.   I believe the patient's symptoms are consistent with gallbladder disease.  We discussed gallbladder disease. The patient was given Neurosurgeon. We discussed non-operative and operative management. We discussed the signs & symptoms of acute cholecystitis  I discussed laparoscopic cholecystectomy with IOC in detail.  The patient was shown diagrams detailing the procedure.  We discussed the risks and benefits of a laparoscopic cholecystectomy including, but not limited to bleeding, infection, injury to surrounding structures such as the intestine or liver, bile leak, retained gallstones, need to convert to an open procedure, prolonged diarrhea, blood clots such as  DVT, common bile duct injury, anesthesia risks, and possible need for additional procedures.  We discussed the typical post-operative recovery course. I explained that the likelihood of improvement of their symptoms is good.  Discussed possibility of not being able to perform IOC and then would have to follow LFTs.   All questions asked and answered.   Leighton Ruff. Redmond Pulling, MD, FACS General, Bariatric, & Minimally Invasive Surgery [facility]   s/p Procedure(s): LAPAROSCOPIC CHOLECYSTECTOMY WITH INTRAOPERATIVE CHOLANGIOGRAM (N/A)   LOS: 1 day    Alexis Fox 10/04/2015

## 2015-10-04 NOTE — Anesthesia Procedure Notes (Signed)
Procedure Name: Intubation Date/Time: 10/04/2015 9:37 AM Performed by: Jacquiline Doe A Pre-anesthesia Checklist: Patient identified, Timeout performed, Emergency Drugs available, Suction available and Patient being monitored Patient Re-evaluated:Patient Re-evaluated prior to inductionOxygen Delivery Method: Circle system utilized Preoxygenation: Pre-oxygenation with 100% oxygen Intubation Type: IV induction, Rapid sequence and Cricoid Pressure applied Laryngoscope Size: Mac and 4 Grade View: Grade I Tube type: Oral Tube size: 7.5 mm Number of attempts: 1 Airway Equipment and Method: Stylet Placement Confirmation: ETT inserted through vocal cords under direct vision,  breath sounds checked- equal and bilateral and positive ETCO2 Secured at: 22 cm Tube secured with: Tape Dental Injury: Teeth and Oropharynx as per pre-operative assessment

## 2015-10-04 NOTE — Anesthesia Preprocedure Evaluation (Addendum)
Anesthesia Evaluation  Patient identified by MRN, date of birth, ID band Patient awake    Reviewed: Allergy & Precautions, NPO status , Patient's Chart, lab work & pertinent test results  History of Anesthesia Complications Negative for: history of anesthetic complications  Airway Mallampati: II  TM Distance: >3 FB Neck ROM: Full    Dental  (+) Dental Advisory Given   Pulmonary neg pulmonary ROS,    breath sounds clear to auscultation       Cardiovascular (-) anginanegative cardio ROS   Rhythm:Regular Rate:Normal     Neuro/Psych Restless leg syndrome    GI/Hepatic GERD  Poorly Controlled,Elevated LFTs with acute chole   Endo/Other  Morbid obesity  Renal/GU negative Renal ROS     Musculoskeletal   Abdominal (+) + obese,   Peds  Hematology negative hematology ROS (+)   Anesthesia Other Findings   Reproductive/Obstetrics                            Anesthesia Physical Anesthesia Plan  ASA: II  Anesthesia Plan: General   Post-op Pain Management:    Induction: Intravenous, Rapid sequence and Cricoid pressure planned  Airway Management Planned: Oral ETT  Additional Equipment:   Intra-op Plan:   Post-operative Plan: Extubation in OR  Informed Consent: I have reviewed the patients History and Physical, chart, labs and discussed the procedure including the risks, benefits and alternatives for the proposed anesthesia with the patient or authorized representative who has indicated his/her understanding and acceptance.   Dental advisory given  Plan Discussed with: CRNA and Surgeon  Anesthesia Plan Comments: (Plan routine monitors, GETA)        Anesthesia Quick Evaluation

## 2015-10-04 NOTE — Progress Notes (Signed)
Patient discussed with my partner Dr. Penelope Coop and Dr. Redmond Pulling from surgery and her hospital computer chart was reviewed as well as her ultrasound MRCP and labs and I will be on standby to proceed with ERCP tomorrow if Intra-Op cholangiogram shows stones

## 2015-10-04 NOTE — Op Note (Signed)
Alexis Fox [id number] 1956/03/29 10/04/2015  Laparoscopic Cholecystectomy with IOC Procedure Note  Indications: This patient presents with symptomatic gallbladder disease and elevated lfts. A preop MRCP showed some bile duct dilation but no evidence a CBD stone and will undergo laparoscopic cholecystectomy.  Pre-operative Diagnosis: symptomatic cholelithiasis  Post-operative Diagnosis: Same  Surgeon: Gayland Curry   Assistants: Gurney Maxin, MD   Anesthesia: General endotracheal anesthesia  ASA Class: 2  Procedure Details  The patient was seen again in the Holding Room. The risks, benefits, complications, treatment options, and expected outcomes were discussed with the patient. The possibilities of reaction to medication, pulmonary aspiration, perforation of viscus, bleeding, recurrent infection, finding a normal gallbladder, the need for additional procedures, failure to diagnose a condition, the possible need to convert to an open procedure, and creating a complication requiring transfusion or operation were discussed with the patient. The likelihood of improving the patient's symptoms with return to their baseline status is good.  The patient and/or family concurred with the proposed plan, giving informed consent. The site of surgery properly noted. The patient was taken to Operating Room, identified as Alexis Fox and the procedure verified as Laparoscopic Cholecystectomy with Intraoperative Cholangiogram. A Time Out was held and the above information confirmed. Antibiotic prophylaxis was administered.   Prior to the induction of general anesthesia, antibiotic prophylaxis was administered. General endotracheal anesthesia was then administered and tolerated well. After the induction, the abdomen was prepped with Chloraprep and draped in the sterile fashion. The patient was positioned in the supine position.  Local anesthetic agent was injected into the skin near the umbilicus  and an incision made. We dissected down to the abdominal fascia with blunt dissection.  The fascia was incised vertically and we entered the peritoneal cavity bluntly.  A pursestring suture of 0-Vicryl was placed around the fascial opening.  The Hasson cannula was inserted and secured with the stay suture.  Pneumoperitoneum was then created with CO2 and tolerated well without any adverse changes in the patient's vital signs. An 5-mm port was placed in the subxiphoid position.  Two 5-mm ports were placed in the right upper quadrant. All skin incisions were infiltrated with a local anesthetic agent before making the incision and placing the trocars.   We positioned the patient in reverse Trendelenburg, tilted slightly to the patient's left.  The gallbladder was identified, the fundus grasped and retracted cephalad. Adhesions were lysed bluntly and with the electrocautery where indicated, taking care not to injure any adjacent organs or viscus. The infundibulum was grasped and retracted laterally, exposing the peritoneum overlying the triangle of Calot. This was then divided and exposed in a blunt fashion. A critical view of the cystic duct and cystic artery was obtained.  The cystic duct was clearly identified and bluntly dissected circumferentially. The cystic duct was ligated with a clip distally.   An incision was made in the cystic duct and the [facility] cholangiogram catheter introduced. The catheter was secured using a clip. A cholangiogram was then obtained which showed good visualization of the distal and proximal biliary tree with no sign of filling defects or obstruction.  Contrast flowed easily into the duodenum. The catheter was then removed.   The cystic duct was then ligated with clips and divided. The cystic artery which had been identified & dissected free was ligated with clips and divided as well.   The gallbladder was dissected from the liver bed in retrograde fashion with the electrocautery. The  gallbladder was removed and  placed in an [place].  The gallbladder and Ecco sac were then removed through the umbilical port site. The liver bed was irrigated and inspected. Hemostasis was achieved with the electrocautery. Copious irrigation was utilized and was repeatedly aspirated until clear.  The pursestring suture was used to close the umbilical fascia.    We again inspected the right upper quadrant for hemostasis. There was an air leak and additional 2 interrupted 0 vicryl sutures were placed at the umbilical fascia.  The umbilical closure was inspected and there was no air leak and nothing trapped within the closure. Pneumoperitoneum was released as we removed the trocars.  4-0 Monocryl was used to close the skin.   Benzoin, steri-strips, and clean dressings were applied. The patient was then extubated and brought to the recovery room in stable condition. Instrument, sponge, and needle counts were correct at closure and at the conclusion of the case.   Findings: Cholelithiasis; +critical view; +normal IOC  Estimated Blood Loss: Minimal         Drains: none         Specimens: Gallbladder           Complications: None; patient tolerated the procedure well.         Disposition: PACU - hemodynamically stable.         Condition: stable  Leighton Ruff. Redmond Pulling, MD, FACS General, Bariatric, & Minimally Invasive Surgery [facility], [place]

## 2015-10-04 NOTE — Progress Notes (Signed)
PROGRESS NOTE  Alexis Fox:[id number] DOB: June 07, 1956 DOA: 10/03/2015 PCP: [name], MD Brief history 59 year old female with a history of restless leg syndrome, depression presented with 3 day history of epigastric pain after eating a meal at [employer]. She states that she has had severe abdominal pain on and off for several years, but it would resolve spontaneously. The patient also developed unremitting pain after she ate pizza on 10/02/2015 with associated nausea without emesis. Because of abdominal pain, she presented to the emergency department. Right upper quadrant ultrasound revealed cholelithiasis with dilated common bile duct at 13 mm. The patient also had elevated hepatic enzymes with AST 421, ALT 232, phosphorus 77, total bilirubin 1.2. Gastroenterology and general surgery were consulted. MRCP was obtained and was negative for common bile duct stone or obstruction. There was no cholecystitis noted.  Assessment/Plan: Symptomatic cholelithiasis -10/04/2015 --s/p laparoscopic cholecystectomy--Dr. Greer Pickerel -appreciate GI and general surgery -remain on clear liquid for now -trend LFTs -Intraoperative cholangiogram negative for filling defects -pain control -continue IVF until pt has adequate po intake Depression/anxiety -Restart Wellbutrin, Celexa, Klonopin Restless leg syndrome -Restart Mirapex Transaminasemia -trend   Family Communication:   Pt at beside Disposition Plan:   Home 1-2 days    Procedures/Studies: Dg Cholangiogram Operative  10/04/2015  CLINICAL DATA:  59 year old female with cholelithiasis EXAM: INTRAOPERATIVE CHOLANGIOGRAM TECHNIQUE: Cholangiographic images from the C-arm fluoroscopic device were submitted for interpretation post-operatively. Please see the procedural report for the amount of contrast and the fluoroscopy time utilized. COMPARISON:  MRCP 10/03/2015 ; abdominal ultrasound 10/03/2015 FINDINGS: Cine clip and still  image obtained during intraoperative cholangiogram at the time of laparoscopic cholecystectomy demonstrate cannulation of the cystic duct remanent with opacification of the biliary tree. There is mild nonspecific dilatation of the common hepatic and common bile ducts without evidence of stenosis, stricture or choledocholithiasis. Contrast material passes freely through the ampulla and into the duodenum. IMPRESSION: 1. Mild nonspecific dilation of the common hepatic and common bile ducts. 2. No evidence of stenosis, stricture, mass or choledocholithiasis. Electronically Signed   By: [name] M.D.   On: 10/04/2015 10:29   Mr Abdomen Mrcp Wo Cm  10/04/2015  CLINICAL DATA:  Severe epigastric and right upper quadrant abdominal pain. Cholelithiasis and biliary ductal dilatation seen on recent ultrasound. Elevated liver function tests. EXAM: MRI ABDOMEN WITHOUT CONTRAST  (INCLUDING MRCP) TECHNIQUE: Multiplanar multisequence MR imaging of the abdomen was performed. Heavily T2-weighted images of the biliary and pancreatic ducts were obtained, and three-dimensional MRCP images were rendered by post processing. COMPARISON:  Ultrasound on 10/03/2015 FINDINGS: Lower chest: No acute findings. Hepatobiliary: No liver masses identified. Numerous tiny gallstones are seen, without evidence of acute cholecystitis. The common bile duct measures 8 mm proximally, with smooth tapering seen distally. No evidence of choledocholithiasis. Pancreas: No mass or inflammatory process visualized on this unenhanced exam. No evidence of pancreatic ductal dilatation. Spleen:  Within normal limits in size. Adrenal Glands/Kidneys: No adrenal mass identified. No evidence of renal mass or hydronephrosis. Tiny right renal cyst noted. Stomach/Bowel/Peritoneum:  Unremarkable. Vascular/Lymphatic: No pathologically enlarged lymph nodes identified. No other significant abnormality identified. Other:  None. Musculoskeletal:  No suspicious bone  lesions identified. IMPRESSION: Cholelithiasis, without evidence of acute cholecystitis. Mild dilatation of common bile duct measuring 8 mm. No radiographic evidence of choledocholithiasis or other obstructing etiology. Electronically Signed   By: [name] M.D.   On: 10/04/2015 07:29   Mr 3d Recon At  Scanner  10/04/2015  CLINICAL DATA:  Severe epigastric and right upper quadrant abdominal pain. Cholelithiasis and biliary ductal dilatation seen on recent ultrasound. Elevated liver function tests. EXAM: MRI ABDOMEN WITHOUT CONTRAST  (INCLUDING MRCP) TECHNIQUE: Multiplanar multisequence MR imaging of the abdomen was performed. Heavily T2-weighted images of the biliary and pancreatic ducts were obtained, and three-dimensional MRCP images were rendered by post processing. COMPARISON:  Ultrasound on 10/03/2015 FINDINGS: Lower chest: No acute findings. Hepatobiliary: No liver masses identified. Numerous tiny gallstones are seen, without evidence of acute cholecystitis. The common bile duct measures 8 mm proximally, with smooth tapering seen distally. No evidence of choledocholithiasis. Pancreas: No mass or inflammatory process visualized on this unenhanced exam. No evidence of pancreatic ductal dilatation. Spleen:  Within normal limits in size. Adrenal Glands/Kidneys: No adrenal mass identified. No evidence of renal mass or hydronephrosis. Tiny right renal cyst noted. Stomach/Bowel/Peritoneum:  Unremarkable. Vascular/Lymphatic: No pathologically enlarged lymph nodes identified. No other significant abnormality identified. Other:  None. Musculoskeletal:  No suspicious bone lesions identified. IMPRESSION: Cholelithiasis, without evidence of acute cholecystitis. Mild dilatation of common bile duct measuring 8 mm. No radiographic evidence of choledocholithiasis or other obstructing etiology. Electronically Signed   By: [name] M.D.   On: 10/04/2015 07:29   US Abdomen Limited Ruq  10/03/2015  CLINICAL DATA:  Right  upper quadrant abdominal pain. Nausea and vomiting. EXAM: US ABDOMEN LIMITED - RIGHT UPPER QUADRANT COMPARISON:  08/27/2012 MRI abdomen. Reports from 01/30/2001 CT abdomen/ pelvis and 12/11/2000 abdominal sonogram (images not available). FINDINGS: Gallbladder: There are numerous layering mobile calcified subcentimeter gallstones in the nondistended gallbladder. No gallbladder wall thickening, pericholecystic fluid or sonographic Murphy's sign. Common bile duct: Diameter: 13 mm There is intrahepatic biliary ductal dilatation. There is moderate common bile duct dilation. No choledocholithiasis is demonstrated in the visualized common bile duct, noting nonvisualization of the distal common bile duct due to overlying bowel gas. Liver: No focal lesion identified. Within normal limits in parenchymal echogenicity. IMPRESSION: 1. Cholelithiasis.  No evidence of acute cholecystitis. 2. New intrahepatic and common bile duct dilatation (13 mm common bile duct diameter). Although no choledocholithiasis is seen in the visualized common bile duct, the distal common bile duct cannot be visualized at sonography due to overlying bowel gas and an obstructing stone or mass cannot be excluded. Recommend further evaluation with MRI abdomen with and without intravenous contrast and MRCP. 3. Normal liver. Electronically Signed   By: Ilona Sorrel M.D.   On: 10/03/2015 10:13        Subjective: Patient complains of some nausea without emesis. Denies any fevers,chest pain, for which breath, vomiting, diarrhea. She complains of abdominal pain. Denies any hematochezia or melena. No fevers or chills  Objective: Filed Vitals:   10/04/15 1130 10/04/15 1145 10/04/15 1158 10/04/15 1218  BP: 112/58 105/62 110/55 99/51  Pulse: 62 63 62 84  Temp:   98.6 F (37 C) 98.1 F (36.7 C)  TempSrc:    Oral  Resp: 13 13 8 20   Height:      Weight:      SpO2: 99% 99% 100% 90%    Intake/Output Summary (Last 24 hours) at 10/04/15 1406 Last  data filed at 10/04/15 1030  Gross per 24 hour  Intake   1600 ml  Output   1020 ml  Net    580 ml   Weight change:  Exam:   General:  Pt is alert, follows commands appropriately, not in acute distress  HEENT: No icterus, No  thrush, No neck mass, [place]/AT  Cardiovascular: RRR, S1/S2, no rubs, no gallops  Respiratory: Diminished breath sounds at the bases. No wheezing.  Abdomen: Soft/diminished bowel sounds. non distended, no guarding  Extremities: No edema, No lymphangitis, No petechiae, No rashes, no synovitis  Data Reviewed: Basic Metabolic Panel:  Recent Labs Lab 10/03/15 0825 10/04/15 0548  NA 139 137  K 4.0 4.1  CL 105 104  CO2 25 27  GLUCOSE 111* 105*  BUN 19 7  CREATININE 0.92 0.91  CALCIUM 9.5 8.7*   Liver Function Tests:  Recent Labs Lab 10/03/15 0825 10/04/15 0548  AST 421* 473*  ALT 232* 526*  ALKPHOS 77 123  BILITOT 1.2 2.0*  PROT 6.9 5.9*  ALBUMIN 3.8 3.3*    Recent Labs Lab 10/03/15 0825  LIPASE 43   No results for input(s): AMMONIA in the last 168 hours. CBC:  Recent Labs Lab 10/03/15 0825 10/04/15 0548  WBC 7.6 5.1  NEUTROABS 5.2  --   HGB 13.2 11.9*  HCT 41.3 37.3  MCV 91.6 93.0  PLT 286 240   Cardiac Enzymes: No results for input(s): CKTOTAL, CKMB, CKMBINDEX, TROPONINI in the last 168 hours. BNP: Invalid input(s): POCBNP CBG: No results for input(s): GLUCAP in the last 168 hours.  Recent Results (from the past 240 hour(s))  Surgical PCR screen     Status: None   Collection Time: 10/04/15  8:48 AM  Result Value Ref Range Status   MRSA, PCR NEGATIVE NEGATIVE Final   Staphylococcus aureus NEGATIVE NEGATIVE Final    Comment:        The Xpert SA Assay (FDA approved for NASAL specimens in patients over 9 years of age), is one component of a comprehensive surveillance program.  Test performance has been validated by [facility] for patients greater than or equal to 60 year old. It is not intended to diagnose  infection nor to guide or monitor treatment.      Scheduled Meds: . buPROPion  150 mg Oral Daily  . citalopram  40 mg Oral QHS  . clonazePAM  0.5 mg Oral QHS  . docusate sodium  100 mg Oral BID  . HYDROmorphone      . pramipexole  0.5 mg Oral QHS  . zolpidem  5 mg Oral QHS   Continuous Infusions: . sodium chloride 75 mL/hr at 10/04/15 1136  . sodium chloride Stopped (10/04/15 1030)     Roselle Norton, DO  [facility] Pager [phone]  If 7PM-7AM, please contact night-coverage [email or web address] Password [email or web address] 10/04/2015, 2:06 PM   LOS: 1 day

## 2015-10-05 ENCOUNTER — Encounter (HOSPITAL_COMMUNITY): Payer: Self-pay | Admitting: General Surgery

## 2015-10-05 DIAGNOSIS — K8020 Calculus of gallbladder without cholecystitis without obstruction: Secondary | ICD-10-CM

## 2015-10-05 DIAGNOSIS — G2581 Restless legs syndrome: Secondary | ICD-10-CM

## 2015-10-05 DIAGNOSIS — R740 Nonspecific elevation of levels of transaminase and lactic acid dehydrogenase [LDH]: Secondary | ICD-10-CM

## 2015-10-05 LAB — COMPREHENSIVE METABOLIC PANEL
ALT: 308 U/L — ABNORMAL HIGH (ref 14–54)
AST: 151 U/L — ABNORMAL HIGH (ref 15–41)
Albumin: 3 g/dL — ABNORMAL LOW (ref 3.5–5.0)
Alkaline Phosphatase: 112 U/L (ref 38–126)
Anion gap: 5 (ref 5–15)
BUN: 7 mg/dL (ref 6–20)
CO2: 27 mmol/L (ref 22–32)
Calcium: 8.7 mg/dL — ABNORMAL LOW (ref 8.9–10.3)
Chloride: 103 mmol/L (ref 101–111)
Creatinine, Ser: 0.99 mg/dL (ref 0.44–1.00)
GFR calc Af Amer: >60 mL/min (ref >60)
GFR calc non Af Amer: >60 mL/min (ref >60)
Glucose, Bld: 94 mg/dL (ref 65–99)
Potassium: 3.6 mmol/L (ref 3.5–5.1)
Sodium: 135 mmol/L (ref 135–145)
Total Bilirubin: 1 mg/dL (ref 0.3–1.2)
Total Protein: 6.1 g/dL — ABNORMAL LOW (ref 6.5–8.1)

## 2015-10-05 MED ORDER — FLUTICASONE PROPIONATE 50 MCG/ACT NA SUSP
1.0000 | Freq: Every day | NASAL | Status: DC
  Administered 2015-10-05 – 2015-10-06 (×2): 1 via NASAL
  Filled 2015-10-05: qty 16

## 2015-10-05 MED ORDER — ACETAMINOPHEN 500 MG PO TABS
1000.0000 mg | ORAL_TABLET | Freq: Four times a day (QID) | ORAL | Status: DC | PRN
  Administered 2015-10-05 – 2015-10-06 (×2): 1000 mg via ORAL
  Filled 2015-10-05 (×2): qty 2

## 2015-10-05 MED ORDER — ENOXAPARIN SODIUM 40 MG/0.4ML ~~LOC~~ SOLN
40.0000 mg | SUBCUTANEOUS | Status: DC
  Administered 2015-10-05: 40 mg via SUBCUTANEOUS
  Filled 2015-10-05: qty 0.4

## 2015-10-05 MED ORDER — LORATADINE 10 MG PO TABS
10.0000 mg | ORAL_TABLET | Freq: Every day | ORAL | Status: DC
  Administered 2015-10-05 – 2015-10-06 (×2): 10 mg via ORAL
  Filled 2015-10-05 (×2): qty 1

## 2015-10-05 MED ORDER — KETOROLAC TROMETHAMINE 30 MG/ML IJ SOLN
15.0000 mg | Freq: Four times a day (QID) | INTRAMUSCULAR | Status: DC | PRN
  Administered 2015-10-05 – 2015-10-06 (×2): 15 mg via INTRAVENOUS
  Filled 2015-10-05 (×2): qty 1

## 2015-10-05 MED ORDER — OXYCODONE HCL 5 MG PO TABS
5.0000 mg | ORAL_TABLET | ORAL | Status: DC | PRN
  Administered 2015-10-05 (×3): 10 mg via ORAL
  Administered 2015-10-06: 5 mg via ORAL
  Filled 2015-10-05: qty 1
  Filled 2015-10-05 (×3): qty 2

## 2015-10-05 NOTE — Care Management Note (Signed)
Case Management Note  Patient Details  Name: MINDI AKERSON MRN: [id number] Date of Birth: [birth year]  Subjective/Objective:                  Independent patient admitted from home for gallstones. 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 Patient denies any CM assistance at this time.  Action/Plan:  Will continue to follow and offer resources as needed.   Expected Discharge Date:                  Expected Discharge Plan:  Home/Self Care  In-House Referral:     Discharge planning Services  CM Consult  Post Acute Care Choice:    Choice offered to:     DME Arranged:    DME Agency:     HH Arranged:    HH Agency:     Status of Service:  In process, will continue to follow  Medicare Important Message Given:    Date Medicare IM Given:    Medicare IM give by:    Date Additional Medicare IM Given:    Additional Medicare Important Message give by:     If discussed at [place] of Stay Meetings, dates discussed:    Additional Comments:  Carles Collet, RN 10/05/2015, 2:07 PM

## 2015-10-05 NOTE — Progress Notes (Signed)
Patient ID: Alexis Fox, female   DOB: [birth year], 59 y.o.   MRN: [id number] 1 Day Post-Op  Subjective: Pt c/o horrible HA, some nausea, and pain.  Objective: Vital signs in last 24 hours: Temp:  [98.1 F (36.7 C)-100.1 F (37.8 C)] 100.1 F (37.8 C) (11/02 0454) Pulse Rate:  [59-84] 62 (11/02 0705) Resp:  [6-20] 18 (11/02 0705) BP: (82-120)/(44-65) 96/51 mmHg (11/02 0705) SpO2:  [79 %-100 %] 79 % (11/02 0454) Last BM Date: 10/02/15  Intake/Output from previous day: 11/01 0701 - 11/02 0700 In: 900 [P.O.:200; I.V.:700] Out: 770 [Urine:750; Blood:20] Intake/Output this shift: Total I/O In: -  Out: 200 [Urine:200]  PE: Abd: soft, +BS, Nd, obese, incisions c/d/i with steri-strips and band-aids present Heart: regular  Lab Results:   Recent Labs  10/03/15 0825 10/04/15 0548  WBC 7.6 5.1  HGB 13.2 11.9*  HCT 41.3 37.3  PLT 286 240   BMET  Recent Labs  10/04/15 0548 10/05/15 0453  NA 137 135  K 4.1 3.6  CL 104 103  CO2 27 27  GLUCOSE 105* 94  BUN 7 7  CREATININE 0.91 0.99  CALCIUM 8.7* 8.7*   PT/INR No results for input(s): LABPROT, INR in the last 72 hours. CMP     Component Value Date/Time   NA 135 10/05/2015 0453   K 3.6 10/05/2015 0453   CL 103 10/05/2015 0453   CO2 27 10/05/2015 0453   GLUCOSE 94 10/05/2015 0453   BUN 7 10/05/2015 0453   CREATININE 0.99 10/05/2015 0453   CALCIUM 8.7* 10/05/2015 0453   PROT 6.1* 10/05/2015 0453   ALBUMIN 3.0* 10/05/2015 0453   AST 151* 10/05/2015 0453   ALT 308* 10/05/2015 0453   ALKPHOS 112 10/05/2015 0453   BILITOT 1.0 10/05/2015 0453   GFRNONAA >60 10/05/2015 0453   GFRAA >60 10/05/2015 0453   Lipase     Component Value Date/Time   LIPASE 43 10/03/2015 0825       Studies/Results: Dg Cholangiogram Operative  10/04/2015  CLINICAL DATA:  59 year old female with cholelithiasis EXAM: INTRAOPERATIVE CHOLANGIOGRAM TECHNIQUE: Cholangiographic images from the C-arm fluoroscopic device were submitted  for interpretation post-operatively. Please see the procedural report for the amount of contrast and the fluoroscopy time utilized. COMPARISON:  MRCP 10/03/2015 ; abdominal ultrasound 10/03/2015 FINDINGS: Cine clip and still image obtained during intraoperative cholangiogram at the time of laparoscopic cholecystectomy demonstrate cannulation of the cystic duct remanent with opacification of the biliary tree. There is mild nonspecific dilatation of the common hepatic and common bile ducts without evidence of stenosis, stricture or choledocholithiasis. Contrast material passes freely through the ampulla and into the duodenum. IMPRESSION: 1. Mild nonspecific dilation of the common hepatic and common bile ducts. 2. No evidence of stenosis, stricture, mass or choledocholithiasis. Electronically Signed   By: [name] M.D.   On: 10/04/2015 10:29   Mr Abdomen Mrcp Wo Cm  10/04/2015  CLINICAL DATA:  Severe epigastric and right upper quadrant abdominal pain. Cholelithiasis and biliary ductal dilatation seen on recent ultrasound. Elevated liver function tests. EXAM: MRI ABDOMEN WITHOUT CONTRAST  (INCLUDING MRCP) TECHNIQUE: Multiplanar multisequence MR imaging of the abdomen was performed. Heavily T2-weighted images of the biliary and pancreatic ducts were obtained, and three-dimensional MRCP images were rendered by post processing. COMPARISON:  Ultrasound on 10/03/2015 FINDINGS: Lower chest: No acute findings. Hepatobiliary: No liver masses identified. Numerous tiny gallstones are seen, without evidence of acute cholecystitis. The common bile duct measures 8 mm proximally, with smooth tapering seen  distally. No evidence of choledocholithiasis. Pancreas: No mass or inflammatory process visualized on this unenhanced exam. No evidence of pancreatic ductal dilatation. Spleen:  Within normal limits in size. Adrenal Glands/Kidneys: No adrenal mass identified. No evidence of renal mass or hydronephrosis. Tiny right renal  cyst noted. Stomach/Bowel/Peritoneum:  Unremarkable. Vascular/Lymphatic: No pathologically enlarged lymph nodes identified. No other significant abnormality identified. Other:  None. Musculoskeletal:  No suspicious bone lesions identified. IMPRESSION: Cholelithiasis, without evidence of acute cholecystitis. Mild dilatation of common bile duct measuring 8 mm. No radiographic evidence of choledocholithiasis or other obstructing etiology. Electronically Signed   By: [name] M.D.   On: 10/04/2015 07:29   Mr 3d Recon At Scanner  10/04/2015  CLINICAL DATA:  Severe epigastric and right upper quadrant abdominal pain. Cholelithiasis and biliary ductal dilatation seen on recent ultrasound. Elevated liver function tests. EXAM: MRI ABDOMEN WITHOUT CONTRAST  (INCLUDING MRCP) TECHNIQUE: Multiplanar multisequence MR imaging of the abdomen was performed. Heavily T2-weighted images of the biliary and pancreatic ducts were obtained, and three-dimensional MRCP images were rendered by post processing. COMPARISON:  Ultrasound on 10/03/2015 FINDINGS: Lower chest: No acute findings. Hepatobiliary: No liver masses identified. Numerous tiny gallstones are seen, without evidence of acute cholecystitis. The common bile duct measures 8 mm proximally, with smooth tapering seen distally. No evidence of choledocholithiasis. Pancreas: No mass or inflammatory process visualized on this unenhanced exam. No evidence of pancreatic ductal dilatation. Spleen:  Within normal limits in size. Adrenal Glands/Kidneys: No adrenal mass identified. No evidence of renal mass or hydronephrosis. Tiny right renal cyst noted. Stomach/Bowel/Peritoneum:  Unremarkable. Vascular/Lymphatic: No pathologically enlarged lymph nodes identified. No other significant abnormality identified. Other:  None. Musculoskeletal:  No suspicious bone lesions identified. IMPRESSION: Cholelithiasis, without evidence of acute cholecystitis. Mild dilatation of common bile duct  measuring 8 mm. No radiographic evidence of choledocholithiasis or other obstructing etiology. Electronically Signed   By: [name] M.D.   On: 10/04/2015 07:29   US Abdomen Limited Ruq  10/03/2015  CLINICAL DATA:  Right upper quadrant abdominal pain. Nausea and vomiting. EXAM: US ABDOMEN LIMITED - RIGHT UPPER QUADRANT COMPARISON:  08/27/2012 MRI abdomen. Reports from 01/30/2001 CT abdomen/ pelvis and 12/11/2000 abdominal sonogram (images not available). FINDINGS: Gallbladder: There are numerous layering mobile calcified subcentimeter gallstones in the nondistended gallbladder. No gallbladder wall thickening, pericholecystic fluid or sonographic Murphy's sign. Common bile duct: Diameter: 13 mm There is intrahepatic biliary ductal dilatation. There is moderate common bile duct dilation. No choledocholithiasis is demonstrated in the visualized common bile duct, noting nonvisualization of the distal common bile duct due to overlying bowel gas. Liver: No focal lesion identified. Within normal limits in parenchymal echogenicity. IMPRESSION: 1. Cholelithiasis.  No evidence of acute cholecystitis. 2. New intrahepatic and common bile duct dilatation (13 mm common bile duct diameter). Although no choledocholithiasis is seen in the visualized common bile duct, the distal common bile duct cannot be visualized at sonography due to overlying bowel gas and an obstructing stone or mass cannot be excluded. Recommend further evaluation with MRI abdomen with and without intravenous contrast and MRCP. 3. Normal liver. Electronically Signed   By: Ilona Sorrel M.D.   On: 10/03/2015 10:13    Anti-infectives: Anti-infectives    Start     Dose/Rate Route Frequency Ordered Stop   10/04/15 0800  cefTRIAXone (ROCEPHIN) 2 g in dextrose 5 % 50 mL IVPB     2 g 100 mL/hr over 30 Minutes Intravenous On call to O.R. 10/04/15 0758 10/04/15 1000  Assessment/Plan  1. POD 1, s/p lap chole with IOC -HA may be secondary to IV  pain meds.  Will try to stop these and adjust oral pain meds so she can get more tylenol to help with her HA, but also have oxy IR instead of vicodin to see if that will help control her pain a little better as well.  Will also add toradol 15mg  q 6h prn for pain control as well -mobilize -adv diet as tolerates -add claritin/flonase per her request from home as this may help with her HA as well -LFTs improving.  Since she will likely remain inpatient today, will recheck in am -mobilize and pulm toilet  LOS: 2 days    Byrne Capek E 10/05/2015, 9:42 AM Pager: [phone]

## 2015-10-05 NOTE — Progress Notes (Addendum)
[name] 8:38 AM  Subjective: Patient doing better than yesterday but still with some pain but different than her admitting pain and tolerating clear liquids about to advance diet and no new complaints and no history of elevated liver tests and has had some episodic pain for 1 year thought secondary to reflux  Objective: Vital signs stable afebrile no acute distress patient not examined today looks a little uncomfortable liver tests decreased IOC reviewed and okay  Assessment: Status post laparoscopic cholecystectomy  Plan: Care per surgical team repeat liver tests in 1-2 weeks as an outpatient happy to see back when necessary  [facility]  Pager [phone] After 5PM or if no answer call [phone]

## 2015-10-05 NOTE — Progress Notes (Signed)
PROGRESS NOTE  Alexis Fox ZHG:[id number] DOB: 20-Sep-1956 DOA: 10/03/2015  PCP: [name], MD  Brief history 59 year old female with a history of restless leg syndrome, depression presented with 3 day history of epigastric pain after eating a meal at [employer]. The patient also developed unremitting pain after she ate pizza on 10/02/2015 with associated nausea without emesis. Because of abdominal pain, she presented to the emergency department. Right upper quadrant ultrasound revealed cholelithiasis with dilated common bile duct at 13 mm. The patient also had elevated hepatic enzymes with AST 421, ALT 232, phosphorus 77, total bilirubin 1.2. Gastroenterology and general surgery were consulted. MRCP was obtained and was negative for common bile duct stone or obstruction. There was no cholecystitis noted.  Assessment/Plan:  Symptomatic cholelithiasis -10/04/2015 --s/p laparoscopic cholecystectomy--Dr. Greer Pickerel -General surgery and GI is following. -Diet to be advanced by surgery. -LFT's improving -Intraoperative cholangiogram negative for filling defects -Pain is not well controlled. General surgery to adjust medications. -continue IVF until pt has adequate po intake  Depression/anxiety -Continue Wellbutrin, Celexa, Klonopin  Restless leg syndrome - Mirapex  DVT prophylaxis: Lovenox CODE STATUS: Full code Family Communication:  Discussed with patient and her husband Disposition Plan: Continue to mobilize.    Procedures/Studies: Dg Cholangiogram Operative  10/04/2015  CLINICAL DATA:  59 year old female with cholelithiasis EXAM: INTRAOPERATIVE CHOLANGIOGRAM TECHNIQUE: Cholangiographic images from the C-arm fluoroscopic device were submitted for interpretation post-operatively. Please see the procedural report for the amount of contrast and the fluoroscopy time utilized. COMPARISON:  MRCP 10/03/2015 ; abdominal ultrasound 10/03/2015 FINDINGS: Cine clip and  still image obtained during intraoperative cholangiogram at the time of laparoscopic cholecystectomy demonstrate cannulation of the cystic duct remanent with opacification of the biliary tree. There is mild nonspecific dilatation of the common hepatic and common bile ducts without evidence of stenosis, stricture or choledocholithiasis. Contrast material passes freely through the ampulla and into the duodenum. IMPRESSION: 1. Mild nonspecific dilation of the common hepatic and common bile ducts. 2. No evidence of stenosis, stricture, mass or choledocholithiasis. Electronically Signed   By: [name] M.D.   On: 10/04/2015 10:29   Mr Abdomen Mrcp Wo Cm  10/04/2015  CLINICAL DATA:  Severe epigastric and right upper quadrant abdominal pain. Cholelithiasis and biliary ductal dilatation seen on recent ultrasound. Elevated liver function tests. EXAM: MRI ABDOMEN WITHOUT CONTRAST  (INCLUDING MRCP) TECHNIQUE: Multiplanar multisequence MR imaging of the abdomen was performed. Heavily T2-weighted images of the biliary and pancreatic ducts were obtained, and three-dimensional MRCP images were rendered by post processing. COMPARISON:  Ultrasound on 10/03/2015 FINDINGS: Lower chest: No acute findings. Hepatobiliary: No liver masses identified. Numerous tiny gallstones are seen, without evidence of acute cholecystitis. The common bile duct measures 8 mm proximally, with smooth tapering seen distally. No evidence of choledocholithiasis. Pancreas: No mass or inflammatory process visualized on this unenhanced exam. No evidence of pancreatic ductal dilatation. Spleen:  Within normal limits in size. Adrenal Glands/Kidneys: No adrenal mass identified. No evidence of renal mass or hydronephrosis. Tiny right renal cyst noted. Stomach/Bowel/Peritoneum:  Unremarkable. Vascular/Lymphatic: No pathologically enlarged lymph nodes identified. No other significant abnormality identified. Other:  None. Musculoskeletal:  No suspicious bone  lesions identified. IMPRESSION: Cholelithiasis, without evidence of acute cholecystitis. Mild dilatation of common bile duct measuring 8 mm. No radiographic evidence of choledocholithiasis or other obstructing etiology. Electronically Signed   By: [name] M.D.   On: 10/04/2015 07:29   Mr 3d Recon At Scanner  10/04/2015  CLINICAL DATA:  Severe epigastric and right upper quadrant abdominal pain. Cholelithiasis and biliary ductal dilatation seen on recent ultrasound. Elevated liver function tests. EXAM: MRI ABDOMEN WITHOUT CONTRAST  (INCLUDING MRCP) TECHNIQUE: Multiplanar multisequence MR imaging of the abdomen was performed. Heavily T2-weighted images of the biliary and pancreatic ducts were obtained, and three-dimensional MRCP images were rendered by post processing. COMPARISON:  Ultrasound on 10/03/2015 FINDINGS: Lower chest: No acute findings. Hepatobiliary: No liver masses identified. Numerous tiny gallstones are seen, without evidence of acute cholecystitis. The common bile duct measures 8 mm proximally, with smooth tapering seen distally. No evidence of choledocholithiasis. Pancreas: No mass or inflammatory process visualized on this unenhanced exam. No evidence of pancreatic ductal dilatation. Spleen:  Within normal limits in size. Adrenal Glands/Kidneys: No adrenal mass identified. No evidence of renal mass or hydronephrosis. Tiny right renal cyst noted. Stomach/Bowel/Peritoneum:  Unremarkable. Vascular/Lymphatic: No pathologically enlarged lymph nodes identified. No other significant abnormality identified. Other:  None. Musculoskeletal:  No suspicious bone lesions identified. IMPRESSION: Cholelithiasis, without evidence of acute cholecystitis. Mild dilatation of common bile duct measuring 8 mm. No radiographic evidence of choledocholithiasis or other obstructing etiology. Electronically Signed   By: [name] M.D.   On: 10/04/2015 07:29   US Abdomen Limited Ruq  10/03/2015  CLINICAL DATA:  Right  upper quadrant abdominal pain. Nausea and vomiting. EXAM: US ABDOMEN LIMITED - RIGHT UPPER QUADRANT COMPARISON:  08/27/2012 MRI abdomen. Reports from 01/30/2001 CT abdomen/ pelvis and 12/11/2000 abdominal sonogram (images not available). FINDINGS: Gallbladder: There are numerous layering mobile calcified subcentimeter gallstones in the nondistended gallbladder. No gallbladder wall thickening, pericholecystic fluid or sonographic Murphy's sign. Common bile duct: Diameter: 13 mm There is intrahepatic biliary ductal dilatation. There is moderate common bile duct dilation. No choledocholithiasis is demonstrated in the visualized common bile duct, noting nonvisualization of the distal common bile duct due to overlying bowel gas. Liver: No focal lesion identified. Within normal limits in parenchymal echogenicity. IMPRESSION: 1. Cholelithiasis.  No evidence of acute cholecystitis. 2. New intrahepatic and common bile duct dilatation (13 mm common bile duct diameter). Although no choledocholithiasis is seen in the visualized common bile duct, the distal common bile duct cannot be visualized at sonography due to overlying bowel gas and an obstructing stone or mass cannot be excluded. Recommend further evaluation with MRI abdomen with and without intravenous contrast and MRCP. 3. Normal liver. Electronically Signed   By: Ilona Sorrel M.D.   On: 10/03/2015 10:13      Subjective: Patient feels unwell this morning. States that her pain is poorly controlled. She complains of some nausea as well. Not passing any gas from below. Has been ambulating.   Objective: Filed Vitals:   10/04/15 2126 10/04/15 2202 10/05/15 0454 10/05/15 0705  BP: 103/53 99/53 82/44  96/51  Pulse: 59 63 63 62  Temp: 98.2 F (36.8 C)  100.1 F (37.8 C)   TempSrc: Oral  Oral   Resp: 18  18 18   Height:      Weight:      SpO2: 95%  79%     Intake/Output Summary (Last 24 hours) at 10/05/15 1242 Last data filed at 10/05/15 0951  Gross per 24  hour  Intake    320 ml  Output    950 ml  Net   -630 ml   Weight change:     Exam:   General:  Pt is alert,  not in acute distress  Cardiovascular: RRR, S1/S2, no rubs, no gallops  Respiratory: Good air entry bilaterally. No wheezing, rales or rhonchi.  Abdomen: Soft. Tender over the right side. No rebound, rigidity or guarding. No masses or organomegaly. Sluggish bowel sounds.  Extremities: No edema,   Data Reviewed: Basic Metabolic Panel:  Recent Labs Lab 10/03/15 0825 10/04/15 0548 10/05/15 0453  NA 139 137 135  K 4.0 4.1 3.6  CL 105 104 103  CO2 25 27 27   GLUCOSE 111* 105* 94  BUN 19 7 7   CREATININE 0.92 0.91 0.99  CALCIUM 9.5 8.7* 8.7*   Liver Function Tests:  Recent Labs Lab 10/03/15 0825 10/04/15 0548 10/05/15 0453  AST 421* 473* 151*  ALT 232* 526* 308*  ALKPHOS 77 123 112  BILITOT 1.2 2.0* 1.0  PROT 6.9 5.9* 6.1*  ALBUMIN 3.8 3.3* 3.0*    Recent Labs Lab 10/03/15 0825  LIPASE 43   CBC:  Recent Labs Lab 10/03/15 0825 10/04/15 0548  WBC 7.6 5.1  NEUTROABS 5.2  --   HGB 13.2 11.9*  HCT 41.3 37.3  MCV 91.6 93.0  PLT 286 240     Recent Results (from the past 240 hour(s))  Surgical PCR screen     Status: None   Collection Time: 10/04/15  8:48 AM  Result Value Ref Range Status   MRSA, PCR NEGATIVE NEGATIVE Final   Staphylococcus aureus NEGATIVE NEGATIVE Final    Comment:        The Xpert SA Assay (FDA approved for NASAL specimens in patients over 57 years of age), is one component of a comprehensive surveillance program.  Test performance has been validated by [facility] for patients greater than or equal to 32 year old. It is not intended to diagnose infection nor to guide or monitor treatment.      Scheduled Meds: . buPROPion  150 mg Oral Daily  . citalopram  40 mg Oral QHS  . clonazePAM  0.5 mg Oral QHS  . docusate sodium  100 mg Oral BID  . enoxaparin (LOVENOX) injection  40 mg Subcutaneous Q24H  . fluticasone   1 spray Each Nare Daily  . loratadine  10 mg Oral Daily  . pramipexole  0.5 mg Oral QHS  . zolpidem  5 mg Oral QHS   Continuous Infusions: . sodium chloride Stopped (10/04/15 1030)    Kechia Yahnke,   [facility] Pager [phone]  If 7PM-7AM, please contact night-coverage [email or web address] Password [email or web address] 10/05/2015, 12:42 PM   LOS: 2 days

## 2015-10-06 LAB — COMPREHENSIVE METABOLIC PANEL
ALT: 188 U/L — ABNORMAL HIGH (ref 14–54)
AST: 61 U/L — ABNORMAL HIGH (ref 15–41)
Albumin: 3 g/dL — ABNORMAL LOW (ref 3.5–5.0)
Alkaline Phosphatase: 92 U/L (ref 38–126)
Anion gap: 6 (ref 5–15)
BUN: 14 mg/dL (ref 6–20)
CO2: 27 mmol/L (ref 22–32)
Calcium: 8.6 mg/dL — ABNORMAL LOW (ref 8.9–10.3)
Chloride: 105 mmol/L (ref 101–111)
Creatinine, Ser: 1.09 mg/dL — ABNORMAL HIGH (ref 0.44–1.00)
GFR calc Af Amer: >60 mL/min (ref >60)
GFR calc non Af Amer: 54 mL/min — ABNORMAL LOW (ref >60)
Glucose, Bld: 91 mg/dL (ref 65–99)
Potassium: 4.1 mmol/L (ref 3.5–5.1)
Sodium: 138 mmol/L (ref 135–145)
Total Bilirubin: 0.5 mg/dL (ref 0.3–1.2)
Total Protein: 5.9 g/dL — ABNORMAL LOW (ref 6.5–8.1)

## 2015-10-06 MED ORDER — DOCUSATE SODIUM 100 MG PO CAPS: 100.0000 mg | ORAL_CAPSULE | Freq: Two times a day (BID) | ORAL | Status: DC

## 2015-10-06 MED ORDER — OXYCODONE HCL 5 MG PO TABS: 5.0000 mg | ORAL_TABLET | ORAL | Status: DC | PRN

## 2015-10-06 NOTE — Discharge Summary (Signed)
[facility]  Physician Discharge Summary   Patient ID: Alexis Fox MRN: [id number] DOB/AGE: 60-28-1957 60 y.o.  Admit date: 10/03/2015 Discharge date: 10/06/2015  PCP: [name], MD  DISCHARGE DIAGNOSES:  Principal Problem:   Symptomatic cholelithiasis Active Problems:   Restless leg syndrome   Transaminitis   RUQ abdominal pain   RECOMMENDATIONS FOR OUTPATIENT FOLLOW UP: 1. Will need repeat LFTs next week per gastroenterology.   DISCHARGE CONDITION: fair  Diet recommendation: Low sodium  Filed Weights   10/03/15 0826  Weight: 92.987 kg (205 lb)    INITIAL HISTORY: 59 year old female with a history of restless leg syndrome, depression presented with 3 day history of epigastric pain after eating a meal at [employer]. The patient also developed unremitting pain after she ate pizza on 10/02/2015 with associated nausea without emesis. Because of abdominal pain, she presented to the emergency department. Right upper quadrant ultrasound revealed cholelithiasis with dilated common bile duct at 13 mm. The patient also had elevated hepatic enzymes with AST 421, ALT 232, phosphorus 77, total bilirubin 1.2. Gastroenterology and general surgery were consulted. MRCP was obtained and was negative for common bile duct stone or obstruction. There was no cholecystitis noted.  Consultations:  General surgery and gastroenterology  Procedures:  Laparoscopic cholecystectomy  HOSPITAL COURSE:   Symptomatic cholelithiasis Patient was seen by general surgery. She had elevated LFTs. She underwent laparoscopic cholecystectomy by Dr. Redmond Pulling. Her diet was slowly advanced. LFTs are improving. Intraoperative cholangiogram was negative for filling defects. Tolerating diet today. Feels much better. Wants to go home. Cleared by general surgery.   Depression/anxiety She may continue her home medications.   Restless leg syndrome Mirapex  Noted to have low blood pressures,  which she states is chronic for her. She denies any symptoms. Has been able to ambulate without any problems. Slightly elevated . They've been noted today. She was asked to keep herself well hydrated and avoid NSAIDs.  Overall, stable. Wants to go home. Cleared for discharge.    PERTINENT LABS:  The results of significant diagnostics from this hospitalization (including imaging, microbiology, ancillary and laboratory) are listed below for reference.    Microbiology: Recent Results (from the past 240 hour(s))  Surgical PCR screen     Status: None   Collection Time: 10/04/15  8:48 AM  Result Value Ref Range Status   MRSA, PCR NEGATIVE NEGATIVE Final   Staphylococcus aureus NEGATIVE NEGATIVE Final    Comment:        The Xpert SA Assay (FDA approved for NASAL specimens in patients over 93 years of age), is one component of a comprehensive surveillance program.  Test performance has been validated by [facility] for patients greater than or equal to 27 year old. It is not intended to diagnose infection nor to guide or monitor treatment.      Labs: Basic Metabolic Panel:  Recent Labs Lab 10/03/15 0825 10/04/15 0548 10/05/15 0453 10/06/15 0616  NA 139 137 135 138  K 4.0 4.1 3.6 4.1  CL 105 104 103 105  CO2 25 27 27 27   GLUCOSE 111* 105* 94 91  BUN 19 7 7 14   CREATININE 0.92 0.91 0.99 1.09*  CALCIUM 9.5 8.7* 8.7* 8.6*   Liver Function Tests:  Recent Labs Lab 10/03/15 0825 10/04/15 0548 10/05/15 0453 10/06/15 0616  AST 421* 473* 151* 61*  ALT 232* 526* 308* 188*  ALKPHOS 77 123 112 92  BILITOT 1.2 2.0* 1.0 0.5  PROT 6.9 5.9* 6.1* 5.9*  ALBUMIN  3.8 3.3* 3.0* 3.0*    Recent Labs Lab 10/03/15 0825  LIPASE 43   CBC:  Recent Labs Lab 10/03/15 0825 10/04/15 0548  WBC 7.6 5.1  NEUTROABS 5.2  --   HGB 13.2 11.9*  HCT 41.3 37.3  MCV 91.6 93.0  PLT 286 240    IMAGING STUDIES Dg Cholangiogram Operative  10/04/2015  CLINICAL DATA:  59 year old female  with cholelithiasis EXAM: INTRAOPERATIVE CHOLANGIOGRAM TECHNIQUE: Cholangiographic images from the C-arm fluoroscopic device were submitted for interpretation post-operatively. Please see the procedural report for the amount of contrast and the fluoroscopy time utilized. COMPARISON:  MRCP 10/03/2015 ; abdominal ultrasound 10/03/2015 FINDINGS: Cine clip and still image obtained during intraoperative cholangiogram at the time of laparoscopic cholecystectomy demonstrate cannulation of the cystic duct remanent with opacification of the biliary tree. There is mild nonspecific dilatation of the common hepatic and common bile ducts without evidence of stenosis, stricture or choledocholithiasis. Contrast material passes freely through the ampulla and into the duodenum. IMPRESSION: 1. Mild nonspecific dilation of the common hepatic and common bile ducts. 2. No evidence of stenosis, stricture, mass or choledocholithiasis. Electronically Signed   By: [name] M.D.   On: 10/04/2015 10:29   Mr Abdomen Mrcp Wo Cm  10/04/2015  CLINICAL DATA:  Severe epigastric and right upper quadrant abdominal pain. Cholelithiasis and biliary ductal dilatation seen on recent ultrasound. Elevated liver function tests. EXAM: MRI ABDOMEN WITHOUT CONTRAST  (INCLUDING MRCP) TECHNIQUE: Multiplanar multisequence MR imaging of the abdomen was performed. Heavily T2-weighted images of the biliary and pancreatic ducts were obtained, and three-dimensional MRCP images were rendered by post processing. COMPARISON:  Ultrasound on 10/03/2015 FINDINGS: Lower chest: No acute findings. Hepatobiliary: No liver masses identified. Numerous tiny gallstones are seen, without evidence of acute cholecystitis. The common bile duct measures 8 mm proximally, with smooth tapering seen distally. No evidence of choledocholithiasis. Pancreas: No mass or inflammatory process visualized on this unenhanced exam. No evidence of pancreatic ductal dilatation. Spleen:   Within normal limits in size. Adrenal Glands/Kidneys: No adrenal mass identified. No evidence of renal mass or hydronephrosis. Tiny right renal cyst noted. Stomach/Bowel/Peritoneum:  Unremarkable. Vascular/Lymphatic: No pathologically enlarged lymph nodes identified. No other significant abnormality identified. Other:  None. Musculoskeletal:  No suspicious bone lesions identified. IMPRESSION: Cholelithiasis, without evidence of acute cholecystitis. Mild dilatation of common bile duct measuring 8 mm. No radiographic evidence of choledocholithiasis or other obstructing etiology. Electronically Signed   By: [name] M.D.   On: 10/04/2015 07:29   Mr 3d Recon At Scanner  10/04/2015  CLINICAL DATA:  Severe epigastric and right upper quadrant abdominal pain. Cholelithiasis and biliary ductal dilatation seen on recent ultrasound. Elevated liver function tests. EXAM: MRI ABDOMEN WITHOUT CONTRAST  (INCLUDING MRCP) TECHNIQUE: Multiplanar multisequence MR imaging of the abdomen was performed. Heavily T2-weighted images of the biliary and pancreatic ducts were obtained, and three-dimensional MRCP images were rendered by post processing. COMPARISON:  Ultrasound on 10/03/2015 FINDINGS: Lower chest: No acute findings. Hepatobiliary: No liver masses identified. Numerous tiny gallstones are seen, without evidence of acute cholecystitis. The common bile duct measures 8 mm proximally, with smooth tapering seen distally. No evidence of choledocholithiasis. Pancreas: No mass or inflammatory process visualized on this unenhanced exam. No evidence of pancreatic ductal dilatation. Spleen:  Within normal limits in size. Adrenal Glands/Kidneys: No adrenal mass identified. No evidence of renal mass or hydronephrosis. Tiny right renal cyst noted. Stomach/Bowel/Peritoneum:  Unremarkable. Vascular/Lymphatic: No pathologically enlarged lymph nodes identified. No other significant abnormality identified.  Other:  None. Musculoskeletal:  No  suspicious bone lesions identified. IMPRESSION: Cholelithiasis, without evidence of acute cholecystitis. Mild dilatation of common bile duct measuring 8 mm. No radiographic evidence of choledocholithiasis or other obstructing etiology. Electronically Signed   By: [name] M.D.   On: 10/04/2015 07:29   US Abdomen Limited Ruq  10/03/2015  CLINICAL DATA:  Right upper quadrant abdominal pain. Nausea and vomiting. EXAM: US ABDOMEN LIMITED - RIGHT UPPER QUADRANT COMPARISON:  08/27/2012 MRI abdomen. Reports from 01/30/2001 CT abdomen/ pelvis and 12/11/2000 abdominal sonogram (images not available). FINDINGS: Gallbladder: There are numerous layering mobile calcified subcentimeter gallstones in the nondistended gallbladder. No gallbladder wall thickening, pericholecystic fluid or sonographic Murphy's sign. Common bile duct: Diameter: 13 mm There is intrahepatic biliary ductal dilatation. There is moderate common bile duct dilation. No choledocholithiasis is demonstrated in the visualized common bile duct, noting nonvisualization of the distal common bile duct due to overlying bowel gas. Liver: No focal lesion identified. Within normal limits in parenchymal echogenicity. IMPRESSION: 1. Cholelithiasis.  No evidence of acute cholecystitis. 2. New intrahepatic and common bile duct dilatation (13 mm common bile duct diameter). Although no choledocholithiasis is seen in the visualized common bile duct, the distal common bile duct cannot be visualized at sonography due to overlying bowel gas and an obstructing stone or mass cannot be excluded. Recommend further evaluation with MRI abdomen with and without intravenous contrast and MRCP. 3. Normal liver. Electronically Signed   By: Ilona Sorrel M.D.   On: 10/03/2015 10:13    DISCHARGE EXAMINATION: Filed Vitals:   10/05/15 1316 10/05/15 2204 10/06/15 0104 10/06/15 0623  BP: 100/50 111/51 94/45 84/54   Pulse: 62 64 58 60  Temp: 98.7 F (37.1 C) 99.6 F (37.6 C)  98.7 F  (37.1 C)  TempSrc: Oral Oral  Oral  Resp: 20 18  14   Height:      Weight:      SpO2: 90% 93% 95% 95%   General appearance: alert, cooperative, appears stated age and no distress Resp: clear to auscultation bilaterally Cardio: regular rate and rhythm, S1, S2 normal, no murmur, click, rub or gallop GI: soft, non-tender; bowel sounds normal; no masses,  no organomegaly Extremities: extremities normal, atraumatic, no cyanosis or edema   DISPOSITION: Home  Discharge Instructions    Call MD for:  difficulty breathing, headache or visual disturbances    Complete by:  As directed      Call MD for:  extreme fatigue    Complete by:  As directed      Call MD for:  persistant dizziness or light-headedness    Complete by:  As directed      Call MD for:  persistant nausea and vomiting    Complete by:  As directed      Call MD for:  severe uncontrolled pain    Complete by:  As directed      Call MD for:  temperature >100.4    Complete by:  As directed      Diet - low sodium heart healthy    Complete by:  As directed      Discharge instructions    Complete by:  As directed   Please go to your PCP for blood work to make sure your liver tests are improving. Please stay well hydrated. Avoid taking NSAIDS including Voltaren for now.  You were cared for by a hospitalist during your hospital stay. If you have any questions about your discharge medications or the  care you received while you were in the hospital after you are discharged, you can call the unit and asked to speak with the hospitalist on call if the hospitalist that took care of you is not available. Once you are discharged, your primary care physician will handle any further medical issues. Please note that NO REFILLS for any discharge medications will be authorized once you are discharged, as it is imperative that you return to your primary care physician (or establish a relationship with a primary care physician if you do not have one)  for your aftercare needs so that they can reassess your need for medications and monitor your lab values. If you do not have a primary care physician, you can call [phone] for a physician referral.     Increase activity slowly    Complete by:  As directed            ALLERGIES: No Known Allergies   Discharge Medication List as of 10/06/2015 11:08 AM    START taking these medications   Details  docusate sodium (COLACE) 100 MG capsule Take 1 capsule (100 mg total) by mouth 2 (two) times daily., Starting 10/06/2015, Until Discontinued, Print    oxyCODONE (OXY IR/ROXICODONE) 5 MG immediate release tablet Take 1-2 tablets (5-10 mg total) by mouth every 4 (four) hours as needed for moderate pain., Starting 10/06/2015, Until Discontinued, Print      CONTINUE these medications which have NOT CHANGED   Details  buPROPion (WELLBUTRIN SR) 150 MG 12 hr tablet Take 150 mg by mouth daily., Until Discontinued, Historical Med    carbidopa-levodopa (SINEMET IR) 25-100 MG tablet Take 1 tablet by mouth 3 (three) times daily as needed. For restless leg, Until Discontinued, Historical Med    citalopram (CELEXA) 40 MG tablet Take 40 mg by mouth at bedtime., Until Discontinued, Historical Med    clonazePAM (KLONOPIN) 1 MG tablet Take 0.5 mg by mouth at bedtime. , Until Discontinued, Historical Med    pramipexole (MIRAPEX) 0.5 MG tablet Take 0.5 mg by mouth at bedtime., Until Discontinued, Historical Med    zolpidem (AMBIEN) 5 MG tablet Take 5 mg by mouth at bedtime., Until Discontinued, Historical Med      STOP taking these medications     diclofenac (VOLTAREN) 75 MG EC tablet        Follow-up Information    Follow up with [name], MD. Schedule an appointment as soon as possible for a visit in 1 week.   Specialty:  Internal Medicine   Why:  post hospitalization follow up and repeat blood work to check liver function// Office will call patient at home for a f/u   Contact information:    [place] [place] [address] [phone]       Follow up with Raylene Miyamoto, PA-C On 10/19/2015.   Specialty:  Surgery   Why:  [facility] Surgery, 11:15am, arrive no later than 10:45am for paperwork   Contact information:   [address]  [place] [address] [phone]       TOTAL DISCHARGE TIME: 35 minutes  [facility] Pager [phone]  10/06/2015, 2:57 PM

## 2015-10-06 NOTE — Discharge Instructions (Signed)
[facility] LAP[facility]: POST OP INSTRUCTIONS Always review your discharge instruction sheet given to you by the facility where your surgery was performed. IF YOU HAVE DISABILITY OR FAMILY LEAVE FORMS, YOU MUST BRING THEM TO THE OFFICE FOR PROCESSING.   DO NOT GIVE THEM TO YOUR DOCTOR.  1. A prescription for pain medication may be given to you upon discharge.  Take your pain medication as prescribed, if needed.  If narcotic pain medicine is not needed, then you may take acetaminophen (Tylenol) or ibuprofen (Advil) as needed. 2. Take your usually prescribed medications unless otherwise directed. 3. If you need a refill on your pain medication, please contact your pharmacy.  They will contact our office to request authorization. Prescriptions will not be filled after 5pm or on week-ends. 4. You should follow a light diet the first few days after arrival home, such as soup and crackers, etc.  Be sure to include lots of fluids daily. 5. Most patients will experience some swelling and bruising in the area of the incisions.  Ice packs will help.  Swelling and bruising can take several days to resolve.  6. It is common to experience some constipation if taking pain medication after surgery.  Increasing fluid intake and taking a stool softener (such as Colace) will usually help or prevent this problem from occurring.  A mild laxative (Milk of Magnesia or Miralax) should be taken according to package instructions if there are no bowel movements after 48 hours. 7. Unless discharge instructions indicate otherwise, you may remove your bandages 24-48 hours after surgery, and you may shower at that time.  You may have steri-strips (small skin tapes) in place directly over the incision.  These strips should be left on the skin for 7-10 days.   8. ACTIVITIES:  You may resume regular (light) daily activities beginning the next day--such as daily self-care, walking, climbing  stairs--gradually increasing activities as tolerated.  You may have sexual intercourse when it is comfortable.  Refrain from any heavy lifting or straining until approved by your doctor. a. You may drive when you are no longer taking prescription pain medication, you can comfortably wear a seatbelt, and you can safely maneuver your car and apply brakes. 9. You should see your doctor in the office for a follow-up appointment approximately 2-3 weeks after your surgery.  Make sure that you call for this appointment within a day or two after you arrive home to insure a convenient appointment time. 10. OTHER INSTRUCTIONS:  WHEN TO CALL YOUR DOCTOR: 1. Fever over 101.0 2. Inability to urinate 3. Continued bleeding from incision. 4. Increased pain, redness, or drainage from the incision. 5. Increasing abdominal pain  The clinic staff is available to answer your questions during regular business hours.  Please dont hesitate to call and ask to speak to one of the nurses for clinical concerns.  If you have a medical emergency, go to the nearest emergency room or call 911.  A surgeon from [facility] is always on call at the hospital. [address] [place], [place], [place]  [address] ? [address] [place], [place], [place]   [address] [phone] ? [phone] ? FAX [phone] Web site: [email or web address]

## 2015-10-06 NOTE — Progress Notes (Signed)
2 Days Post-Op  Subjective: Feels much better. Much less pain/nausea. Ate breakfast  Objective: Vital signs in last 24 hours: Temp:  [98.7 F (37.1 C)-99.6 F (37.6 C)] 98.7 F (37.1 C) (11/03 0623) Pulse Rate:  [58-64] 60 (11/03 0623) Resp:  [14-20] 14 (11/03 0623) BP: (84-111)/(45-54) 84/54 mmHg (11/03 0623) SpO2:  [90 %-95 %] 95 % (11/03 0623) Last BM Date: 10/02/15  Intake/Output from previous day: 11/02 0701 - 11/03 0700 In: 320 [P.O.:320] Out: 200 [Urine:200] Intake/Output this shift:    Alert, nad cta Obese, soft, min TTP, incision c/d/i  Lab Results:   Recent Labs  10/04/15 0548  WBC 5.1  HGB 11.9*  HCT 37.3  PLT 240   BMET  Recent Labs  10/05/15 0453 10/06/15 0616  NA 135 138  K 3.6 4.1  CL 103 105  CO2 27 27  GLUCOSE 94 91  BUN 7 14  CREATININE 0.99 1.09*  CALCIUM 8.7* 8.6*   PT/INR No results for input(s): LABPROT, INR in the last 72 hours. ABG No results for input(s): PHART, HCO3 in the last 72 hours.  Invalid input(s): PCO2, PO2  Studies/Results: Dg Cholangiogram Operative  10/04/2015  CLINICAL DATA:  59 year old female with cholelithiasis EXAM: INTRAOPERATIVE CHOLANGIOGRAM TECHNIQUE: Cholangiographic images from the C-arm fluoroscopic device were submitted for interpretation post-operatively. Please see the procedural report for the amount of contrast and the fluoroscopy time utilized. COMPARISON:  MRCP 10/03/2015 ; abdominal ultrasound 10/03/2015 FINDINGS: Cine clip and still image obtained during intraoperative cholangiogram at the time of laparoscopic cholecystectomy demonstrate cannulation of the cystic duct remanent with opacification of the biliary tree. There is mild nonspecific dilatation of the common hepatic and common bile ducts without evidence of stenosis, stricture or choledocholithiasis. Contrast material passes freely through the ampulla and into the duodenum. IMPRESSION: 1. Mild nonspecific dilation of the common hepatic and  common bile ducts. 2. No evidence of stenosis, stricture, mass or choledocholithiasis. Electronically Signed   By: [name] M.D.   On: 10/04/2015 10:29    Anti-infectives: Anti-infectives    Start     Dose/Rate Route Frequency Ordered Stop   10/04/15 0800  cefTRIAXone (ROCEPHIN) 2 g in dextrose 5 % 50 mL IVPB     2 g 100 mL/hr over 30 Minutes Intravenous On call to O.R. 10/04/15 0758 10/04/15 1000      Assessment/Plan: s/p Procedure(s): LAPAROSCOPIC CHOLECYSTECTOMY WITH INTRAOPERATIVE CHOLANGIOGRAM (N/A)  Doing well LFTs trending down Agree with outpt check of lfts Discussed dc instructions with pt  Leighton Ruff. Redmond Pulling, MD, FACS General, Bariatric, & Minimally Invasive Surgery [facility], [place]   LOS: 3 days    Gayland Curry 10/06/2015

## 2015-10-06 NOTE — Progress Notes (Signed)
NURSING PROGRESS NOTE  Alexis Fox [id number] Discharge Data: 10/06/2015 11:53 AM Attending Provider: Bonnielee Haff, MD SKA:[id number],WIOM Broadus John, MD     [name] to be D/C'd Home per MD order.  Discussed with the patient the After Visit Summary and all questions fully answered. All IV's discontinued with no bleeding noted. All belongings returned to patient for patient to take home.   Last Vital Signs:  Blood pressure 84/54, pulse 60, temperature 98.7 F (37.1 C), temperature source Oral, resp. rate 14, height 5\' 4"  (1.626 m), weight 92.987 kg (205 lb), SpO2 95 %.  Discharge Medication List   Medication List    STOP taking these medications        diclofenac 75 MG EC tablet  Commonly known as:  VOLTAREN      TAKE these medications        buPROPion 150 MG 12 hr tablet  Commonly known as:  WELLBUTRIN SR  Take 150 mg by mouth daily.     carbidopa-levodopa 25-100 MG tablet  Commonly known as:  SINEMET IR  Take 1 tablet by mouth 3 (three) times daily as needed. For restless leg     citalopram 40 MG tablet  Commonly known as:  CELEXA  Take 40 mg by mouth at bedtime.     clonazePAM 1 MG tablet  Commonly known as:  KLONOPIN  Take 0.5 mg by mouth at bedtime.     docusate sodium 100 MG capsule  Commonly known as:  COLACE  Take 1 capsule (100 mg total) by mouth 2 (two) times daily.     oxyCODONE 5 MG immediate release tablet  Commonly known as:  Oxy IR/ROXICODONE  Take 1-2 tablets (5-10 mg total) by mouth every 4 (four) hours as needed for moderate pain.     pramipexole 0.5 MG tablet  Commonly known as:  MIRAPEX  Take 0.5 mg by mouth at bedtime.     zolpidem 5 MG tablet  Commonly known as:  AMBIEN  Take 5 mg by mouth at bedtime.         Doristine Devoid, RN

## 2015-10-06 NOTE — Progress Notes (Signed)
[name] 9:15 AM  Subjective: Patient doing much better than yesterday and ate some of her breakfast and has no new complaints  Objective: Vital signs stable afebrile no acute distress abdomen is softer only seemingly incisional discomfort LFTs improved  Assessment: Improved  Plan: We discussed repeating liver tests as an outpatient and I am happy to see back when necessary  [facility]  Pager [phone] After 5PM or if no answer call [phone]

## 2015-12-21 ENCOUNTER — Other Ambulatory Visit: Payer: Self-pay | Admitting: Women's Health

## 2015-12-21 MED FILL — CITALOPRAM HBR 40 MG TABLET: 40 | 90 days supply | Qty: 90 | Fill #0

## 2016-01-09 MED FILL — PRAMIPEXOLE 0.5 MG TABLET: 0.5 | 90 days supply | Qty: 90 | Fill #1

## 2016-01-19 MED FILL — ESTRADIOL-NORETH 0.5-0.1 MG: 0.5-0.1 | 84 days supply | Qty: 84 | Fill #0

## 2016-02-10 DIAGNOSIS — Z23 Encounter for immunization: Secondary | ICD-10-CM | POA: Diagnosis not present

## 2016-02-10 DIAGNOSIS — R002 Palpitations: Secondary | ICD-10-CM | POA: Diagnosis not present

## 2016-02-10 DIAGNOSIS — G2581 Restless legs syndrome: Secondary | ICD-10-CM | POA: Diagnosis not present

## 2016-02-10 DIAGNOSIS — F325 Major depressive disorder, single episode, in full remission: Secondary | ICD-10-CM | POA: Diagnosis not present

## 2016-02-10 DIAGNOSIS — R10819 Abdominal tenderness, unspecified site: Secondary | ICD-10-CM | POA: Diagnosis not present

## 2016-02-10 DIAGNOSIS — F418 Other specified anxiety disorders: Secondary | ICD-10-CM | POA: Diagnosis not present

## 2016-02-13 MED FILL — clonazePAM 1 MG TABS: 1 | 90 days supply | Qty: 90 | Fill #1

## 2016-02-16 ENCOUNTER — Other Ambulatory Visit: Payer: Self-pay

## 2016-02-16 DIAGNOSIS — Z1231 Encounter for screening mammogram for malignant neoplasm of breast: Secondary | ICD-10-CM

## 2016-03-02 ENCOUNTER — Ambulatory Visit
Admission: RE | Admit: 2016-03-02 | Discharge: 2016-03-02 | Disposition: A | Discharge status: Home / Self Care | Payer: 59 | Source: Ambulatory Visit

## 2016-03-02 DIAGNOSIS — Z1231 Encounter for screening mammogram for malignant neoplasm of breast: Secondary | ICD-10-CM | POA: Diagnosis not present

## 2016-03-06 ENCOUNTER — Other Ambulatory Visit: Payer: Self-pay | Admitting: Internal Medicine

## 2016-03-06 DIAGNOSIS — R928 Other abnormal and inconclusive findings on diagnostic imaging of breast: Secondary | ICD-10-CM

## 2016-03-06 MED FILL — BUPROPION HCL XL 150 MG TAB: 150 | 90 days supply | Qty: 90 | Fill #1

## 2016-03-07 MED FILL — RESTASIS 0.05% EYE EMULSION: 0.05 | 90 days supply | Qty: 180 | Fill #0

## 2016-03-09 ENCOUNTER — Encounter: Payer: Self-pay | Admitting: Women's Health

## 2016-03-09 ENCOUNTER — Ambulatory Visit (INDEPENDENT_AMBULATORY_CARE_PROVIDER_SITE_OTHER): Payer: 59 | Admitting: Women's Health

## 2016-03-09 VITALS — BP 126/80 | Ht 64.0 in | Wt 208.0 lb

## 2016-03-09 DIAGNOSIS — Z1329 Encounter for screening for other suspected endocrine disorder: Secondary | ICD-10-CM

## 2016-03-09 DIAGNOSIS — Z13220 Encounter for screening for lipoid disorders: Secondary | ICD-10-CM

## 2016-03-09 DIAGNOSIS — Z79890 Hormone replacement therapy (postmenopausal): Secondary | ICD-10-CM | POA: Diagnosis not present

## 2016-03-09 DIAGNOSIS — N898 Other specified noninflammatory disorders of vagina: Secondary | ICD-10-CM | POA: Diagnosis not present

## 2016-03-09 DIAGNOSIS — Z01419 Encounter for gynecological examination (general) (routine) without abnormal findings: Secondary | ICD-10-CM | POA: Diagnosis not present

## 2016-03-09 DIAGNOSIS — E559 Vitamin D deficiency, unspecified: Secondary | ICD-10-CM | POA: Diagnosis not present

## 2016-03-09 MED ORDER — ESTRADIOL-NORETHINDRONE ACET 0.5-0.1 MG PO TABS: 1.0000 | ORAL_TABLET | Freq: Every day | ORAL | Status: DC

## 2016-03-09 MED ORDER — NYSTATIN-TRIAMCINOLONE 100000-0.1 UNIT/GM-% EX OINT: 1.0000 "application " | TOPICAL_OINTMENT | Freq: Two times a day (BID) | CUTANEOUS | Status: DC

## 2016-03-09 MED FILL — ZOLPIDEM TARTRATE 5 MG TAB: 5 | 30 days supply | Qty: 30 | Fill #0

## 2016-03-09 MED FILL — NYSTATIN-TRIAMCINOLONE OINT: 100000-0.1 | 20 days supply | Qty: 60 | Fill #0

## 2016-03-09 NOTE — Progress Notes (Signed)
Alexis Fox 1956-03-24 [id number]    History:    Presents for annual exam.  Postmenopausal/ HRT/no bleeding. Normal Pap and mammogram history has a mammogram scheduled for follow-up this week. 2008 normal DEXA. Normal colonoscopy due next year. History of HSV rare outbreaks. Primary care manages depression, restless leg labs and meds. 10/2015 cholecystectomy.  Past medical history, past surgical history, family history and social history were all reviewed and documented in the EPIC chart. 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 Mother hypertension and heart disease.  ROS:  A ROS was performed and pertinent positives and negatives are included.  Exam:  Filed Vitals:   03/09/16 0904  BP: 126/80    General appearance:  Normal Thyroid:  Symmetrical, normal in size, without palpable masses or nodularity. Respiratory  Auscultation:  Clear without wheezing or rhonchi Cardiovascular  Auscultation:  Regular rate, without rubs, murmurs or gallops  Edema/varicosities:  Not grossly evident Abdominal  Soft,nontender, without masses, guarding or rebound.  Liver/spleen:  No organomegaly noted  Hernia:  None appreciated  Skin  Inspection:  Grossly normal   Breasts: Examined lying and sitting.     Right: Without masses, retractions, discharge or axillary adenopathy.     Left: Without masses, retractions, discharge or axillary adenopathy. Gentitourinary   Inguinal/mons:  Normal without inguinal adenopathy  External genitalia:  Normal  BUS/Urethra/Skene's glands:  Normal  Vagina:  Normal  Cervix:  Normal  Uterus:   normal in size, shape and contour.  Midline and mobile  Adnexa/parametria:     Rt: Without masses or tenderness.   Lt: Without masses or tenderness.  Anus and perineum: Normal  Digital rectal exam: Normal sphincter tone without palpated masses or tenderness  Assessment/Plan:  60 y.o. M WF G2 P2 for annual exam with no complaints.  Postmenopausal/HRT/no bleeding.  HSV rare outbreaks Depression, restless leg-primary care managesLabs and meds HSV-rare outbreaks  Plan: HRT reviewed, Activella 0.5/0.1 prescription, proper use, risk for blood clots, strokes and breast cancer reviewed. Continues with hot flushes. SBE's, regular exercise, decrease calories for weight loss encouraged. Vitamin D 2000 daily encouraged. Declines need for Valtrex. Repeat DEXA instructed to schedule. Pap with HR HPV typing, new screening guidelines reviewed. Keep scheduled follow-up breast ultrasound.    TANZIE, LASLIE WHNP, 10:20 AM 03/09/2016

## 2016-03-09 NOTE — Patient Instructions (Signed)

## 2016-03-12 NOTE — Addendum Note (Signed)
Addended by: Joaquin Music on: 03/12/2016 09:41 AM   Modules accepted: Orders

## 2016-03-13 ENCOUNTER — Ambulatory Visit
Admission: RE | Admit: 2016-03-13 | Discharge: 2016-03-13 | Disposition: A | Discharge status: Home / Self Care | Payer: 59 | Source: Ambulatory Visit | Attending: Internal Medicine | Admitting: Internal Medicine

## 2016-03-13 ENCOUNTER — Other Ambulatory Visit: Payer: Self-pay | Admitting: Internal Medicine

## 2016-03-13 DIAGNOSIS — R928 Other abnormal and inconclusive findings on diagnostic imaging of breast: Secondary | ICD-10-CM

## 2016-03-13 DIAGNOSIS — N63 Unspecified lump in breast: Secondary | ICD-10-CM | POA: Diagnosis not present

## 2016-03-13 DIAGNOSIS — R2232 Localized swelling, mass and lump, left upper limb: Secondary | ICD-10-CM

## 2016-03-13 LAB — PAP, TP IMAGING W/ HPV RNA, RFLX HPV TYPE 16,18/45: HPV mRNA, High Risk: NOT DETECTED

## 2016-03-13 MED FILL — CITALOPRAM HBR 40 MG TABLET: 40 | 90 days supply | Qty: 90 | Fill #1

## 2016-03-19 ENCOUNTER — Ambulatory Visit
Admission: RE | Admit: 2016-03-19 | Discharge: 2016-03-19 | Disposition: A | Discharge status: Home / Self Care | Payer: 59 | Source: Ambulatory Visit | Attending: Internal Medicine | Admitting: Internal Medicine

## 2016-03-19 ENCOUNTER — Other Ambulatory Visit: Payer: Self-pay | Admitting: Internal Medicine

## 2016-03-19 DIAGNOSIS — R2232 Localized swelling, mass and lump, left upper limb: Secondary | ICD-10-CM

## 2016-03-19 DIAGNOSIS — R590 Localized enlarged lymph nodes: Secondary | ICD-10-CM | POA: Diagnosis not present

## 2016-03-19 DIAGNOSIS — D0512 Intraductal carcinoma in situ of left breast: Secondary | ICD-10-CM | POA: Diagnosis not present

## 2016-03-19 DIAGNOSIS — R928 Other abnormal and inconclusive findings on diagnostic imaging of breast: Secondary | ICD-10-CM

## 2016-03-19 DIAGNOSIS — C773 Secondary and unspecified malignant neoplasm of axilla and upper limb lymph nodes: Secondary | ICD-10-CM | POA: Diagnosis not present

## 2016-03-19 DIAGNOSIS — N63 Unspecified lump in breast: Secondary | ICD-10-CM | POA: Diagnosis not present

## 2016-03-19 DIAGNOSIS — C50412 Malignant neoplasm of upper-outer quadrant of left female breast: Secondary | ICD-10-CM | POA: Diagnosis not present

## 2016-03-20 ENCOUNTER — Encounter: Payer: Self-pay | Admitting: *Deleted

## 2016-03-20 ENCOUNTER — Telehealth: Payer: Self-pay | Admitting: *Deleted

## 2016-03-20 DIAGNOSIS — C50412 Malignant neoplasm of upper-outer quadrant of left female breast: Secondary | ICD-10-CM

## 2016-03-20 HISTORY — DX: Malignant neoplasm of upper-outer quadrant of left female breast: C50.412

## 2016-03-20 NOTE — Telephone Encounter (Signed)
Confirmed [facility] for 03/28/16 at 0815.  Instructions and contact information given.

## 2016-03-28 ENCOUNTER — Ambulatory Visit: Payer: 59

## 2016-03-28 ENCOUNTER — Encounter: Payer: Self-pay | Admitting: Physical Therapy

## 2016-03-28 ENCOUNTER — Ambulatory Visit
Admission: RE | Admit: 2016-03-28 | Discharge: 2016-03-28 | Disposition: A | Discharge status: Home / Self Care | Payer: 59 | Source: Ambulatory Visit | Attending: Radiation Oncology | Admitting: Radiation Oncology

## 2016-03-28 ENCOUNTER — Encounter: Payer: Self-pay | Admitting: *Deleted

## 2016-03-28 ENCOUNTER — Encounter: Payer: Self-pay | Admitting: Skilled Nursing Facility1

## 2016-03-28 ENCOUNTER — Ambulatory Visit (HOSPITAL_BASED_OUTPATIENT_CLINIC_OR_DEPARTMENT_OTHER): Payer: 59 | Admitting: Hematology and Oncology

## 2016-03-28 ENCOUNTER — Other Ambulatory Visit: Payer: Self-pay | Admitting: General Surgery

## 2016-03-28 ENCOUNTER — Other Ambulatory Visit (HOSPITAL_BASED_OUTPATIENT_CLINIC_OR_DEPARTMENT_OTHER): Payer: 59

## 2016-03-28 ENCOUNTER — Ambulatory Visit: Payer: 59 | Attending: General Surgery | Admitting: Physical Therapy

## 2016-03-28 ENCOUNTER — Other Ambulatory Visit: Payer: Self-pay | Admitting: *Deleted

## 2016-03-28 ENCOUNTER — Other Ambulatory Visit: Payer: Self-pay | Admitting: Hematology and Oncology

## 2016-03-28 ENCOUNTER — Telehealth: Payer: Self-pay | Admitting: Hematology and Oncology

## 2016-03-28 ENCOUNTER — Other Ambulatory Visit: Payer: Self-pay

## 2016-03-28 ENCOUNTER — Encounter: Payer: Self-pay | Admitting: Hematology and Oncology

## 2016-03-28 VITALS — BP 108/53 | HR 72 | Temp 97.9°F | Resp 18 | Wt 202.4 lb

## 2016-03-28 DIAGNOSIS — C50412 Malignant neoplasm of upper-outer quadrant of left female breast: Secondary | ICD-10-CM

## 2016-03-28 DIAGNOSIS — Z006 Encounter for examination for normal comparison and control in clinical research program: Secondary | ICD-10-CM

## 2016-03-28 DIAGNOSIS — C773 Secondary and unspecified malignant neoplasm of axilla and upper limb lymph nodes: Secondary | ICD-10-CM

## 2016-03-28 DIAGNOSIS — R293 Abnormal posture: Secondary | ICD-10-CM | POA: Diagnosis not present

## 2016-03-28 LAB — CBC WITH DIFFERENTIAL/PLATELET
BASO%: 0.3 % (ref 0.0–2.0)
Basophils Absolute: 0 10*3/uL (ref 0.0–0.1)
EOS%: 4.1 % (ref 0.0–7.0)
Eosinophils Absolute: 0.2 10*3/uL (ref 0.0–0.5)
HCT: 41.6 % (ref 34.8–46.6)
HGB: 13.5 g/dL (ref 11.6–15.9)
LYMPH%: 30.5 % (ref 14.0–49.7)
MCH: 29 pg (ref 25.1–34.0)
MCHC: 32.5 g/dL (ref 31.5–36.0)
MCV: 89.5 fL (ref 79.5–101.0)
MONO#: 0.5 10*3/uL (ref 0.1–0.9)
MONO%: 9.3 % (ref 0.0–14.0)
NEUT#: 3.2 10*3/uL (ref 1.5–6.5)
NEUT%: 55.8 % (ref 38.4–76.8)
Platelets: 250 10*3/uL (ref 145–400)
RBC: 4.65 10*6/uL (ref 3.70–5.45)
RDW: 13.8 % (ref 11.2–14.5)
WBC: 5.8 10*3/uL (ref 3.9–10.3)
lymph#: 1.8 10*3/uL (ref 0.9–3.3)

## 2016-03-28 LAB — COMPREHENSIVE METABOLIC PANEL
ALT: 12 U/L (ref 0–55)
AST: 14 U/L (ref 5–34)
Albumin: 3.9 g/dL (ref 3.5–5.0)
Alkaline Phosphatase: 62 U/L (ref 40–150)
Anion Gap: 8 mEq/L (ref 3–11)
BUN: 13.2 mg/dL (ref 7.0–26.0)
CO2: 28 mEq/L (ref 22–29)
Calcium: 9.5 mg/dL (ref 8.4–10.4)
Chloride: 105 mEq/L (ref 98–109)
Creatinine: 1 mg/dL (ref 0.6–1.1)
EGFR: 59 mL/min/{1.73_m2} — ABNORMAL LOW (ref >90)
Glucose: 99 mg/dl (ref 70–140)
Potassium: 4.3 mEq/L (ref 3.5–5.1)
Sodium: 141 mEq/L (ref 136–145)
Total Bilirubin: 0.47 mg/dL (ref 0.20–1.20)
Total Protein: 7.3 g/dL (ref 6.4–8.3)

## 2016-03-28 MED ORDER — LORAZEPAM 0.5 MG PO TABS: 0.5000 mg | ORAL_TABLET | Freq: Every day | ORAL | Status: DC

## 2016-03-28 MED ORDER — LIDOCAINE-PRILOCAINE 2.5-2.5 % EX CREA: TOPICAL_CREAM | CUTANEOUS | Status: DC

## 2016-03-28 MED ORDER — PROCHLORPERAZINE MALEATE 10 MG PO TABS: 10.0000 mg | ORAL_TABLET | Freq: Four times a day (QID) | ORAL | Status: DC | PRN

## 2016-03-28 MED ORDER — DEXAMETHASONE 4 MG PO TABS: 4.0000 mg | ORAL_TABLET | Freq: Two times a day (BID) | ORAL | Status: DC

## 2016-03-28 MED ORDER — ONDANSETRON HCL 8 MG PO TABS: 8.0000 mg | ORAL_TABLET | Freq: Two times a day (BID) | ORAL | Status: DC | PRN

## 2016-03-28 MED FILL — LORazepam 0.5 MG TABS: 0.5 | 30 days supply | Qty: 30 | Fill #0

## 2016-03-28 MED FILL — DEXAMETHASONE 4 MG TABLET: 4 | 15 days supply | Qty: 30 | Fill #0

## 2016-03-28 MED FILL — PROCHLORPERAZINE 10 MG TAB: 10 | 7 days supply | Qty: 30 | Fill #0

## 2016-03-28 MED FILL — ONDANSETRON HCL 8 MG TABLET: 8 | 15 days supply | Qty: 30 | Fill #0

## 2016-03-28 MED FILL — LIDOCAINE-PRILOCAINE CREAM: 2.5-2.5 | 30 days supply | Qty: 30 | Fill #0

## 2016-03-28 NOTE — Assessment & Plan Note (Addendum)
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 After reviewing the pathology in detail, we proceeded to discuss the different treatment options between surgery, radiation, chemotherapy, antiestrogen therapies.  Recommendation based on multidisciplinary tumor board: 1. Neoadjuvant chemotherapy with Adriamycin and Cytoxan dose dense 4 followed by Abraxane weekly 12 2. Followed by breast conserving surgery with sentinel lymph node study vs targeted axillary dissection 3. Followed by adjuvant radiation therapy  Chemotherapy Counseling: I discussed the risks and benefits of chemotherapy including the risks of nausea/ vomiting, risk of infection from low WBC count, fatigue due to chemo or anemia, bruising or bleeding due to low platelets, mouth sores, loss/ change in taste and decreased appetite. Liver and kidney function will be monitored through out chemotherapy as abnormalities in liver and kidney function may be a side effect of treatment. Cardiac dysfunction due to Adriamycin was discussed in detail. Risk of permanent bone marrow dysfunction due to chemo were also discussed.  Plan: 1. Port placement to be done next Monday 2. Echocardiogram 3. Chemotherapy class 4. Breast MRI 5. CT chest abdomen pelvis and bone scan for staging Genetic counseling will also be arranged  PREVENT trial: [id number]  [phone]  Newly diagnosed breast cancer Stages 1-3 scheduled to receive anthracycline randomized to atorvastatin 40 mg or placebo once daily for 24 months along with 3 cardiac MRIs or 2 years paid for by the trial. I discussed the risks and benefits of atorvastatin including the risk of myopathy and elevation of liver function tests. I explained the randomization process and patient is interested in the trial.   Return to clinic in 1 week to start chemotherapy.

## 2016-03-28 NOTE — Telephone Encounter (Signed)
appt made and avs printed °

## 2016-03-28 NOTE — Therapy (Signed)
[place], [place], [address] Phone: [phone]   Fax:  [phone]  Physical Therapy Evaluation  Patient Details  Name: Alexis Fox MRN: [id number] Date of Birth: 03-Dec-1956 Referring Provider: Dr. Rolm Bookbinder  Encounter Date: 03/28/2016      PT End of Session - 03/28/16 1154    Visit Number 1   Number of Visits 1   PT Start Time 8144   PT Stop Time 1133   PT Time Calculation (min) 28 min   Activity Tolerance Patient tolerated treatment well   Behavior During Therapy [facility] for tasks assessed/performed;Anxious      Past Medical History  Diagnosis Date  . Arthritis     knees  . Depression   . Restless leg syndrome   . Cholecystitis   . Breast cancer of upper-outer quadrant of left female breast ([place]) 03/20/2016  . Anxiety   . Hot flashes     Past Surgical History  Procedure Laterality Date  . Elbow surgery      right for epicondylitis 2010   . Bunionectomy    . Foot surgery      1983 -tarsal tunnel release  . Lumbar disc surgery  04/04/2012  . Tarsal tunnel release    . Spinal fusion  5/12    L5-S1  . Cholecystectomy N/A 10/04/2015    Procedure: LAPAROSCOPIC CHOLECYSTECTOMY WITH INTRAOPERATIVE CHOLANGIOGRAM;  Surgeon: Greer Pickerel, MD;  Location: [place];  Service: General;  Laterality: N/A;    There were no vitals filed for this visit.       Subjective Assessment - 03/28/16 1155    Subjective Patient reports she is here today to be assessed by her medical team for her newly diagnosed left breast cancer.   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  She has multiple masses which total 6.4 cm.  The Ki67 is 90%.   Patient Stated Goals reduce lymphedema risk and learn post op shoulder ROM HEP            [facility] PT Assessment - 03/28/16 0001    Assessment   Medical Diagnosis Left breast cancer   Referring Provider Dr. Rolm Bookbinder   Onset Date/Surgical Date 03/19/16   Hand Dominance Right   Prior Therapy none   Precautions   Precautions Other (comment)   Precaution Comments Active breast cancer   Restrictions   Weight Bearing Restrictions No   Balance Screen   Has the patient fallen in the past 6 months No   Has the patient had a decrease in activity level because of a fear of falling?  No   Is the patient reluctant to leave their home because of a fear of falling?  No   Home Ecologist residence   Living Arrangements Spouse/significant other;Other relatives  Husband, son, daughter-in-law, 33 mth and 97 y.o. grandchild   Available Help at Discharge Family   Prior Function   Level of Independence Independent   Vocation Full time employment   Vocation Requirements Diabetes educator at [employer]; has not worked since biopsy due to anxiety   Leisure She walks her dogs 4 times per week for 30 minutes   Cognition   Overall Cognitive Status Within Functional Limits for tasks assessed   Posture/Postural Control   Posture/Postural Control Postural limitations   Postural Limitations Rounded Shoulders;Forward head  ROM / Strength   AROM / PROM / Strength AROM;Strength   AROM   AROM Assessment Site Shoulder;Cervical   Right/Left Shoulder Right;Left   Right Shoulder Extension 55 Degrees   Right Shoulder Flexion 170 Degrees   Right Shoulder ABduction 175 Degrees   Right Shoulder Internal Rotation 78 Degrees   Right Shoulder External Rotation 88 Degrees   Left Shoulder Extension 56 Degrees   Left Shoulder Flexion 168 Degrees   Left Shoulder ABduction 175 Degrees   Left Shoulder Internal Rotation 71 Degrees   Left Shoulder External Rotation 90 Degrees   Cervical Flexion WNL   Cervical Extension WNL   Cervical - Right Side Bend WNL   Cervical - Left Side Bend WNL   Cervical - Right Rotation WNL    Cervical - Left Rotation WNL   Strength   Overall Strength Within functional limits for tasks performed           LYMPHEDEMA/ONCOLOGY QUESTIONNAIRE - 03/28/16 1151    Type   Cancer Type Left breast cancer   Lymphedema Assessments   Lymphedema Assessments Upper extremities   Right Upper Extremity Lymphedema   10 cm Proximal to Olecranon Process 33.5 cm   Olecranon Process 27 cm   10 cm Proximal to Ulnar Styloid Process 24 cm   Just Proximal to Ulnar Styloid Process 16.7 cm   Across Hand at [employer] 18.2 cm   At [place] of 2nd Digit 6.4 cm   Left Upper Extremity Lymphedema   10 cm Proximal to Olecranon Process 33.8 cm   Olecranon Process 25.8 cm   10 cm Proximal to Ulnar Styloid Process 22.3 cm   Just Proximal to Ulnar Styloid Process 15.9 cm   Across Hand at [employer] 17.6 cm   At [place] of 2nd Digit 6.4 cm      Patient was instructed today in a home exercise program today for post op shoulder range of motion. These included active assist shoulder flexion in sitting, scapular retraction, wall walking with shoulder abduction, and hands behind head external rotation.  She was encouraged to do these twice a day, holding 3 seconds and repeating 5 times when permitted by her physician.         PT Education - 03/28/16 1153    Education provided Yes   Education Details Post op shoulder ROM HEP and lymphedema risk reduction   Person(s) Educated Patient;Spouse   Methods Explanation;Demonstration;Handout   Comprehension Verbalized understanding;Returned demonstration              Breast Clinic Goals - 03/28/16 1213    Patient will be able to verbalize understanding of pertinent lymphedema risk reduction practices relevant to her diagnosis specifically related to skin care.   Time 1   Period Days   Status Achieved   Patient will be able to return demonstrate and/or verbalize understanding of the post-op home exercise program related to regaining shoulder range of  motion.   Time 1   Period Days   Status Achieved   Patient will be able to verbalize understanding of the importance of attending the postoperative After Breast Cancer Class for further lymphedema risk reduction education and therapeutic exercise.   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  She  has multiple masses which total 6.4 cm.  The Ki67 is 90%.  She has a positive axillary lymph node. She is planning to have staging scan done including s CT Scan of her chest, abdomen, and pelvis with a bone scan.  She is planning to have neoadjuvant chemotherapy followed by a left lumpectomy or mastectomy with a targeted axillary node dissection and radiation.   Rehab Potential Excellent   Clinical Impairments Affecting Rehab Potential none   PT Frequency One time visit   PT Treatment/Interventions Therapeutic exercise;Patient/family education   PT Next Visit Plan Will f/u after surgery   PT Home Exercise Plan Post op shoulder ROM HEP   Consulted and Agree with Plan of Care Patient;Family member/caregiver   Family Member Consulted Husband      Patient will benefit from skilled therapeutic intervention in order to improve the following deficits and impairments:  Decreased strength, Decreased knowledge of precautions, Pain, Impaired UE functional use, Decreased range of motion  Visit Diagnosis: Carcinoma of upper-outer quadrant of left female breast ([place]) - Plan: PT plan of care cert/re-cert  Abnormal posture - Plan: PT plan of care cert/re-cert   Patient will follow up at outpatient cancer rehab if needed following surgery.  If the patient requires physical therapy at that time, a specific plan will be dictated and sent to the referring physician for approval. The patient was educated today on appropriate basic range of motion  exercises to begin post operatively and the importance of attending the After Breast Cancer class following surgery.  Patient was educated today on lymphedema risk reduction practices as it pertains to recommendations that will benefit the patient immediately following surgery.  She verbalized good understanding.  No additional physical therapy is indicated at this time.      Problem List Patient Active Problem List   Diagnosis Date Noted  . Breast cancer of upper-outer quadrant of left female breast ([place]) 03/20/2016  . Transaminitis 10/03/2015  . Symptomatic cholelithiasis 10/03/2015  . RUQ abdominal pain   . Depression 06/24/2013  . Restless leg syndrome 06/24/2013    Annia Friendly, PT 03/28/2016 12:19 PM  [place], [place], [address] Phone: [phone]   Fax:  [phone]  Name: Alexis Fox MRN: [id number] Date of Birth: [birth year]

## 2016-03-28 NOTE — Progress Notes (Signed)
Subjective:     Patient ID: Alexis Fox, female   DOB: 10-Jan-1956, 60 y.o.   MRN: [id number]  HPI   Review of Systems     Objective:   Physical Exam For the patient to understand and be given the tools to implement a healthy plant based diet during their cancer diagnosis.     Assessment:     Dietitian did not see pt per pts request. Pts GFR 59.      Plan:     Dietitian will give folder of information to physical therapist to give the pt.

## 2016-03-28 NOTE — Progress Notes (Signed)
[place] Psychosocial Distress Screening Clinical Social Work  Clinical Social Work was referred by distress screening protocol.  The patient scored a 8 on the Psychosocial Distress Thermometer which indicates severe distress. Clinical Social Worker met with pt at [facility] [place] to review supports and to assess for distress and other psychosocial needs. Pt reports her anxiety about her diagnosis has been debilitating and she feels like she cannot work currently. Pt has had issues with depression that are long term. She feels this has been managed and is currently under control, her celexa is working. CSW discussed common reactions, emotions and coping techniques related to new cancer diagnosis. Pt educated about various support programs and resources to assist her. Pt and husband agree to follow up as needed.   ONCBCN DISTRESS SCREENING 03/28/2016  Screening Type Initial Screening  Distress experienced in past week (1-10) 8  Practical problem type Insurance;Work/school  Family Problem type Partner;Children  Emotional problem type Depression;Nervousness/Anxiety;Feeling hopeless  Physical Problem type Pain;Sleep/insomnia;Loss of appetitie;Swollen arms/legs  Physician notified of physical symptoms Yes  Referral to clinical social work Yes  Referral to support programs Yes    Clinical Social Worker follow up needed: No.  If yes, follow up plan:  Loren Racer, [place]  [facility] Phone: [phone] Fax: [phone]

## 2016-03-28 NOTE — Progress Notes (Signed)
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  HISTORY OF PRESENT ILLNESS::Alexis Fox is a 60 y.o. female who had a screening mammogram detecting a left breast mass. Multiple lymph nodes were found to be abnormal. The large group of masses measures over 6.4 cm. Biopsy revealed grade 3 invasive ductal carcinoma triple negative with a Ki67 of 90 %. 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  She is accompanied by her husband.  She has had some discomfort since her biopsy.   PREVIOUS RADIATION THERAPY: No  Gynecologic History:  Age at first menstrual period? 12-13 ish Still having periods? No  Approximate date of last period? 2009  Ever used hormone replacement? Yes  If YES, for how long? 8 years When was hormone replacement ended: 1 week ago Obstetric History:  How many children have you carried to term? 2 Age at first live birth: 6 Pregnant now or trying to get pregnant? No Ever used birth control pills or hormone shots for contraception? Yes If so, for how long?: 4 years Health Maintenance:  Have you ever had a colonoscopy? Yes If yes, date: >5 <10  Have you ever had a bone density? Yes If yes, date: < 5 years Date of your last PAP smear? 03/2016 Date of your FIRST mammogram? "2012?"  Past medical, social and family history were reviewed in the electronic chart. Review of symptoms was reviewed in the electronic chart. Medications were reviewed in the electronic chart.   PHYSICAL EXAM:  BP: 108/53 mmHg, Pulse Rate: 72, Resp: 18, Temp: 97.9 F, Temp Source: Oral, SpO2: 99%, Weight, 202 lb 6.4 oz, Height _0   This is a well appearing female in no acute distress. She is alert and oriented. In the upper-outer  quadrant of the left breast, there is bruising. No palpable adenopathy. No palpable right lymph nodes. No palpable mass in the right breast.  IMPRESSION: 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   PLAN: If the patient recieves lumpectomy, we will need a pre RT mammogram. We discussed the role of radiation and decreasing local failures in patients who undergo lumpectomy. We discussed the retrospective data showing an increase in failure rates in patients who have a pathologic complete response and did not undergo radiation. For this reason I have recommended radiation to the whole breast followed by boost to the tumor bed. We discussed the process of simulation the placement tattoos. She would be a candidate for the alliance trial looking at axillary node dissection versus sentinel lymph node dissection or the NSABP study if she has a complete response looking at radiation to the breast and lymph nodes versus radiation ot the breast only. She is also a candidate for breath hold technique which we will discuss with her if needed. We discussed possible side effects during treatment including but not limited to skin irritation darkness and fatigue. We discussed long-term effects of treatment which are extremely unlikely but possible including damage to the lungs and ribs. We discussed the low likelihood of secondary malignancies.    I spent 15 minutes face to face with the patient and more than 50% of that time was spent in counseling and/or coordination of  care.   ------------------------------------------------  Thea Silversmith, MD  This document serves as a record of services personally performed by Thea Silversmith, MD. It was created on her behalf by Jenell Milliner, a trained medical scribe. The creation of this record is based on the scribe's personal observations and the provider's statements to them. This document has been checked and approved by the  attending provider.

## 2016-03-28 NOTE — Patient Instructions (Signed)

## 2016-03-28 NOTE — Progress Notes (Signed)
[place] CONSULT NOTE  Patient Care Team: [name], MD as PCP - General (Internal Medicine)  CHIEF COMPLAINTS/PURPOSE OF CONSULTATION:  Newly diagnosed breast cancer  HISTORY OF PRESENTING ILLNESS:  Alexis Fox 60 y.o. female is here because of recent diagnosis of left breast cancer. Patient was diagnosed with a left breast cancer initial mammogram revealed 2 foci of abnormality. The measured 2.7 cm and 2.5 cm. There are also multiple enlarged left axillary lymph nodes the largest measuring 1.7 cm. The group of masses in the breast measures 6.4 cm in the upper outer quadrant. She underwent ultrasound-guided biopsy which revealed a grade 3 invasive ductal carcinoma that was triple negative with a Ki-67 of 90%. 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 She was presented this morning at the multidisciplinary tumor board and she is here today at [facility] clinic to discuss the treatment plan.  I reviewed her records extensively and collaborated the history with the patient.  SUMMARY OF ONCOLOGIC HISTORY:   Breast cancer of upper-outer quadrant of left female breast ([place])   03/02/2016 Mammogram Hypoechoic masses in the upper outer quadrant left breast 2 adjacent irregular masses 2.5-1.4 x 1.2 cm and the other 2.7 x 1.4 x 2.3 cm total span 6.4 cm, enlarged multiple axillary lymph nodes measuring 1.7 cm, T2 N1/N2 stage IIb vs IIIa   03/19/2016 Initial Diagnosis Left breast biopsy 12:30 position: Invasive ductal carcinoma with DCIS, 1/1 lymph node positive, grade 3, ER 0%, PR 0%, HER-2 negative ratio 1.68, Ki-67 90%    In terms of breast cancer risk profile:  She menarched at early age of 19 and went to menopause at age 42  She had 2 pregnancy, her first child was born at age 75  She has received birth control pills for approximately 8 years.  She was exposed to hormone replacement therapy for 4 years stopped only recently She has no family history  of Breast/GYN/GI cancer  MEDICAL HISTORY:  Past Medical History  Diagnosis Date  . Arthritis     knees  . Depression   . Restless leg syndrome   . Cholecystitis   . Breast cancer of upper-outer quadrant of left female breast ([place]) 03/20/2016  . Anxiety   . Hot flashes     SURGICAL HISTORY: Past Surgical History  Procedure Laterality Date  . Elbow surgery      right for epicondylitis 2010   . Bunionectomy    . Foot surgery      1983 -tarsal tunnel release  . Lumbar disc surgery  04/04/2012  . Tarsal tunnel release    . Spinal fusion  5/12    L5-S1  . Cholecystectomy N/A 10/04/2015    Procedure: LAPAROSCOPIC CHOLECYSTECTOMY WITH INTRAOPERATIVE CHOLANGIOGRAM;  Surgeon: Greer Pickerel, MD;  Location: [facility];  Service: General;  Laterality: N/A;    SOCIAL HISTORY: Social History   Social History  . Marital Status: Married    Spouse Name: N/A  . Number of Children: N/A  . Years of Education: N/A   Occupational History  . Not on file.   Social History Main Topics  . Smoking status: Never Smoker   . Smokeless tobacco: Never Used  . Alcohol Use: No  . Drug Use: No  . Sexual Activity: Yes    Birth Control/ Protection: Post-menopausal   Other Topics Concern  . Not on file   Social History Narrative    FAMILY HISTORY: Family History  Problem Relation  Age of Onset  . Anesthesia problems Neg Hx   . Heart disease Mother   . Pulmonary fibrosis Mother   . Hypertension Mother   . Cancer Father     astocytoma    ALLERGIES:  has No Known Allergies.  MEDICATIONS:  Current Outpatient Prescriptions  Medication Sig Dispense Refill  . buPROPion (WELLBUTRIN SR) 150 MG 12 hr tablet Take 150 mg by mouth daily.    . citalopram (CELEXA) 40 MG tablet   3  . clonazePAM (KLONOPIN) 1 MG tablet Take 0.5 mg by mouth at bedtime.     Marland Kitchen loratadine (CLARITIN) 10 MG tablet Take 10 mg by mouth daily.    Marland Kitchen nystatin-triamcinolone ointment (MYCOLOG) Apply 1 application topically 2 (two)  times daily. 60 g 3  . pramipexole (MIRAPEX) 0.5 MG tablet Take 0.5 mg by mouth at bedtime.    Marland Kitchen zolpidem (AMBIEN) 5 MG tablet Take 5 mg by mouth at bedtime.    . carbidopa-levodopa (SINEMET IR) 25-100 MG tablet Take 1 tablet by mouth 3 (three) times daily as needed. Reported on 03/28/2016    . Estradiol-Norethindrone Acet 0.5-0.1 MG tablet Take 1 tablet by mouth daily. (Patient not taking: Reported on 03/28/2016) 84 tablet 4   No current facility-administered medications for this visit.    REVIEW OF SYSTEMS:   Constitutional: Denies fevers, chills or abnormal night sweats Eyes: Denies blurriness of vision, double vision or watery eyes Ears, nose, mouth, throat, and face: Denies mucositis or sore throat Respiratory: Denies cough, dyspnea or wheezes Cardiovascular: Denies palpitation, chest discomfort or lower extremity swelling Gastrointestinal:  Denies nausea, heartburn or change in bowel habits Skin: Denies abnormal skin rashes Lymphatics: Denies new lymphadenopathy or easy bruising Neurological:Denies numbness, tingling or new weaknesses Behavioral/Psych: Mood is stable, no new changes  Breast: Bruising related to recent biopsy no palpable lymphadenopathy All other systems were reviewed with the patient and are negative.  PHYSICAL EXAMINATION: ECOG PERFORMANCE STATUS: 0 - Asymptomatic  Filed Vitals:   03/28/16 0844  BP: 108/53  Pulse: 72  Temp: 97.9 F (36.6 C)  Resp: 18   Filed Weights   03/28/16 0844  Weight: 202 lb 6.4 oz (91.808 kg)    GENERAL:alert, no distress and comfortable SKIN: skin color, texture, turgor are normal, no rashes or significant lesions EYES: normal, conjunctiva are pink and non-injected, sclera clear OROPHARYNX:no exudate, no erythema and lips, buccal mucosa, and tongue normal  NECK: supple, thyroid normal size, non-tender, without nodularity LYMPH:  no palpable lymphadenopathy in the cervical, axillary or inguinal LUNGS: clear to auscultation and  percussion with normal breathing effort HEART: regular rate & rhythm and no murmurs and no lower extremity edema ABDOMEN:abdomen soft, non-tender and normal bowel sounds Musculoskeletal:no cyanosis of digits and no clubbing  PSYCH: alert & oriented x 3 with fluent speech NEURO: no focal motor/sensory deficits BREAST:Hematoma related to recent biopsy and no palpable lymphadenopathy. No palpable axillary or supraclavicular lymphadenopathy (exam performed in the presence of a chaperone)   LABORATORY DATA:  I have reviewed the data as listed Lab Results  Component Value Date   WBC 5.8 03/28/2016   HGB 13.5 03/28/2016   HCT 41.6 03/28/2016   MCV 89.5 03/28/2016   PLT 250 03/28/2016   Lab Results  Component Value Date   NA 141 03/28/2016   K 4.3 03/28/2016   CL 105 10/06/2015   CO2 28 03/28/2016    RADIOGRAPHIC STUDIES: I have personally reviewed the radiological reports and agreed with the findings in  the report.  ASSESSMENT AND PLAN:  Breast cancer of upper-outer quadrant of left female breast ([place]) Mammogram 03/02/2016: Hypoechoic masses in the upper outer quadrant left breast 2 adjacent irregular masses 2.5-1.4 x 1.2 cm and the other 2.7 x 1.4 x 2.3 cm total span 6.4 cm, enlarged multiple axillary lymph nodes measuring 1.7 cm, T2 N1/N2 stage IIb vs IIIa Left breast biopsy 03/19/2016 12:30 position: Invasive ductal carcinoma with DCIS, 1/1 lymph node positive, grade 3, ER 0%, PR 0%, HER-2 negative ratio 1.68, Ki-67 90%  Pathology and radiology counseling: Discussed with the patient, the details of pathology including the type of breast cancer,the clinical staging, the significance of ER, PR and HER-2/neu receptors and the implications for treatment. After reviewing the pathology in detail, we proceeded to discuss the different treatment options between surgery, radiation, chemotherapy, antiestrogen therapies.  Recommendation based on multidisciplinary tumor board: 1. Neoadjuvant  chemotherapy with Adriamycin and Cytoxan dose dense 4 followed by Abraxane weekly 12 2. Followed by breast conserving surgery with sentinel lymph node study vs targeted axillary dissection 3. Followed by adjuvant radiation therapy  Chemotherapy Counseling: I discussed the risks and benefits of chemotherapy including the risks of nausea/ vomiting, risk of infection from low WBC count, fatigue due to chemo or anemia, bruising or bleeding due to low platelets, mouth sores, loss/ change in taste and decreased appetite. Liver and kidney function will be monitored through out chemotherapy as abnormalities in liver and kidney function may be a side effect of treatment. Cardiac dysfunction due to Adriamycin was discussed in detail. Risk of permanent bone marrow dysfunction due to chemo were also discussed.  Plan: 1. Port placement to be done next Monday 2. Echocardiogram 3. Chemotherapy class 4. Breast MRI 5. CT chest abdomen pelvis and bone scan for staging Genetic counseling will also be arranged  PREVENT trial: [id number] [phone]  Newly diagnosed breast cancer Stages 1-3 scheduled to receive anthracycline randomized to atorvastatin 40 mg or placebo once daily for 24 months along with 3 cardiac MRIs or 2 years paid for by the trial. I discussed the risks and benefits of atorvastatin including the risk of myopathy and elevation of liver function tests. I explained the randomization process and patient is interested in the trial.   Return to clinic in 1 week to start chemotherapy.  All questions were answered. The patient knows to call the clinic with any problems, questions or concerns.    Rulon Eisenmenger, MD 03/28/2016

## 2016-03-29 ENCOUNTER — Encounter: Payer: Self-pay | Admitting: *Deleted

## 2016-03-29 ENCOUNTER — Other Ambulatory Visit: Payer: Self-pay | Admitting: *Deleted

## 2016-03-29 ENCOUNTER — Encounter (HOSPITAL_BASED_OUTPATIENT_CLINIC_OR_DEPARTMENT_OTHER): Payer: Self-pay | Admitting: *Deleted

## 2016-03-29 ENCOUNTER — Telehealth: Payer: Self-pay | Admitting: *Deleted

## 2016-03-29 ENCOUNTER — Other Ambulatory Visit: Payer: 59

## 2016-03-29 DIAGNOSIS — C50412 Malignant neoplasm of upper-outer quadrant of left female breast: Secondary | ICD-10-CM

## 2016-03-29 NOTE — Telephone Encounter (Signed)
Per staff message and POF I have scheduled appts. Advised scheduler of appts. [name]  

## 2016-03-30 ENCOUNTER — Encounter (HOSPITAL_COMMUNITY): Payer: Self-pay

## 2016-03-30 ENCOUNTER — Encounter (HOSPITAL_COMMUNITY)
Admission: RE | Admit: 2016-03-30 | Discharge: 2016-03-30 | Disposition: A | Discharge status: Home / Self Care | Payer: 59 | Source: Ambulatory Visit | Attending: Hematology and Oncology | Admitting: Hematology and Oncology

## 2016-03-30 ENCOUNTER — Telehealth: Payer: Self-pay | Admitting: Hematology and Oncology

## 2016-03-30 ENCOUNTER — Telehealth: Payer: Self-pay | Admitting: *Deleted

## 2016-03-30 ENCOUNTER — Other Ambulatory Visit: Payer: Self-pay | Admitting: *Deleted

## 2016-03-30 ENCOUNTER — Encounter: Payer: Self-pay | Admitting: Hematology and Oncology

## 2016-03-30 ENCOUNTER — Ambulatory Visit (HOSPITAL_COMMUNITY)
Admission: RE | Admit: 2016-03-30 | Discharge: 2016-03-30 | Disposition: A | Discharge status: Home / Self Care | Payer: 59 | Source: Ambulatory Visit | Attending: Hematology and Oncology | Admitting: Hematology and Oncology

## 2016-03-30 ENCOUNTER — Ambulatory Visit (HOSPITAL_COMMUNITY): Payer: 59

## 2016-03-30 DIAGNOSIS — C50412 Malignant neoplasm of upper-outer quadrant of left female breast: Secondary | ICD-10-CM | POA: Insufficient documentation

## 2016-03-30 DIAGNOSIS — C779 Secondary and unspecified malignant neoplasm of lymph node, unspecified: Secondary | ICD-10-CM | POA: Insufficient documentation

## 2016-03-30 DIAGNOSIS — C773 Secondary and unspecified malignant neoplasm of axilla and upper limb lymph nodes: Secondary | ICD-10-CM | POA: Insufficient documentation

## 2016-03-30 DIAGNOSIS — Z853 Personal history of malignant neoplasm of breast: Secondary | ICD-10-CM | POA: Diagnosis not present

## 2016-03-30 DIAGNOSIS — C50912 Malignant neoplasm of unspecified site of left female breast: Secondary | ICD-10-CM | POA: Diagnosis not present

## 2016-03-30 MED ORDER — TECHNETIUM TC 99M MEDRONATE IV KIT
22.7000 | PACK | Freq: Once | INTRAVENOUS | Status: AC | PRN
  Administered 2016-03-30: 22.7 via INTRAVENOUS

## 2016-03-30 MED ORDER — IOPAMIDOL (ISOVUE-300) INJECTION 61%
100.0000 mL | Freq: Once | INTRAVENOUS | Status: AC | PRN
  Administered 2016-03-30: 100 mL via INTRAVENOUS

## 2016-03-30 NOTE — Telephone Encounter (Signed)
Left message with echo appt. For 5/3 at [facility].

## 2016-03-30 NOTE — Telephone Encounter (Signed)
s.w.l pt and advised on May appt....pt ok and aware

## 2016-04-02 ENCOUNTER — Telehealth: Payer: Self-pay | Admitting: *Deleted

## 2016-04-02 ENCOUNTER — Ambulatory Visit (HOSPITAL_COMMUNITY): Payer: 59

## 2016-04-02 ENCOUNTER — Ambulatory Visit (HOSPITAL_BASED_OUTPATIENT_CLINIC_OR_DEPARTMENT_OTHER)
Admission: RE | Admit: 2016-04-02 | Discharge: 2016-04-02 | Disposition: A | Discharge status: Home / Self Care | Payer: 59 | Source: Ambulatory Visit | Attending: General Surgery | Admitting: General Surgery

## 2016-04-02 ENCOUNTER — Encounter (HOSPITAL_BASED_OUTPATIENT_CLINIC_OR_DEPARTMENT_OTHER)
Admission: RE | Disposition: A | Discharge status: Home / Self Care | Payer: Self-pay | Source: Ambulatory Visit | Attending: General Surgery

## 2016-04-02 ENCOUNTER — Ambulatory Visit (HOSPITAL_BASED_OUTPATIENT_CLINIC_OR_DEPARTMENT_OTHER): Payer: 59 | Admitting: Anesthesiology

## 2016-04-02 ENCOUNTER — Encounter (HOSPITAL_BASED_OUTPATIENT_CLINIC_OR_DEPARTMENT_OTHER): Payer: Self-pay

## 2016-04-02 ENCOUNTER — Encounter: Payer: Self-pay | Admitting: Hematology and Oncology

## 2016-04-02 ENCOUNTER — Telehealth (HOSPITAL_COMMUNITY): Payer: Self-pay | Admitting: Vascular Surgery

## 2016-04-02 DIAGNOSIS — Z95828 Presence of other vascular implants and grafts: Secondary | ICD-10-CM | POA: Diagnosis not present

## 2016-04-02 DIAGNOSIS — Z79899 Other long term (current) drug therapy: Secondary | ICD-10-CM | POA: Diagnosis not present

## 2016-04-02 DIAGNOSIS — C50412 Malignant neoplasm of upper-outer quadrant of left female breast: Secondary | ICD-10-CM | POA: Diagnosis not present

## 2016-04-02 DIAGNOSIS — F419 Anxiety disorder, unspecified: Secondary | ICD-10-CM | POA: Insufficient documentation

## 2016-04-02 DIAGNOSIS — C50919 Malignant neoplasm of unspecified site of unspecified female breast: Secondary | ICD-10-CM | POA: Diagnosis not present

## 2016-04-02 DIAGNOSIS — F329 Major depressive disorder, single episode, unspecified: Secondary | ICD-10-CM | POA: Insufficient documentation

## 2016-04-02 HISTORY — PX: PORTACATH PLACEMENT: SHX2246

## 2016-04-02 SURGERY — INSERTION, TUNNELED CENTRAL VENOUS DEVICE, WITH PORT
Anesthesia: General | Laterality: Right

## 2016-04-02 MED ORDER — CEFAZOLIN SODIUM-DEXTROSE 2-4 GM/100ML-% IV SOLN
INTRAVENOUS | Status: AC
  Filled 2016-04-02: qty 100

## 2016-04-02 MED ORDER — TRAMADOL HCL 50 MG PO TABS: 50.0000 mg | ORAL_TABLET | Freq: Four times a day (QID) | ORAL | Status: DC | PRN

## 2016-04-02 MED ORDER — MEPERIDINE HCL 25 MG/ML IJ SOLN: 6.2500 mg | INTRAMUSCULAR | Status: DC | PRN

## 2016-04-02 MED ORDER — HEPARIN (PORCINE) IN NACL 2-0.9 UNIT/ML-% IJ SOLN
INTRAMUSCULAR | Status: DC | PRN
  Administered 2016-04-02: 20 mL via INTRAVENOUS

## 2016-04-02 MED ORDER — FENTANYL CITRATE (PF) 100 MCG/2ML IJ SOLN
INTRAMUSCULAR | Status: AC
  Filled 2016-04-02: qty 2

## 2016-04-02 MED ORDER — SCOPOLAMINE 1 MG/3DAYS TD PT72: 1.0000 | MEDICATED_PATCH | Freq: Once | TRANSDERMAL | Status: DC | PRN

## 2016-04-02 MED ORDER — GLYCOPYRROLATE 0.2 MG/ML IJ SOLN: 0.2000 mg | Freq: Once | INTRAMUSCULAR | Status: DC | PRN

## 2016-04-02 MED ORDER — SUCCINYLCHOLINE CHLORIDE 200 MG/10ML IV SOSY
PREFILLED_SYRINGE | INTRAVENOUS | Status: AC
  Filled 2016-04-02: qty 10

## 2016-04-02 MED ORDER — HEPARIN (PORCINE) IN NACL 2-0.9 UNIT/ML-% IJ SOLN
INTRAMUSCULAR | Status: AC
  Filled 2016-04-02: qty 500

## 2016-04-02 MED ORDER — PROPOFOL 10 MG/ML IV BOLUS
INTRAVENOUS | Status: DC | PRN
  Administered 2016-04-02: 250 mg via INTRAVENOUS

## 2016-04-02 MED ORDER — PROPOFOL 500 MG/50ML IV EMUL
INTRAVENOUS | Status: AC
  Filled 2016-04-02: qty 50

## 2016-04-02 MED ORDER — HEPARIN SOD (PORK) LOCK FLUSH 100 UNIT/ML IV SOLN
INTRAVENOUS | Status: DC | PRN
  Administered 2016-04-02: 400 [IU] via INTRAVENOUS

## 2016-04-02 MED ORDER — CEFAZOLIN SODIUM-DEXTROSE 2-4 GM/100ML-% IV SOLN
2.0000 g | INTRAVENOUS | Status: AC
  Administered 2016-04-02: 2 g via INTRAVENOUS

## 2016-04-02 MED ORDER — MIDAZOLAM HCL 2 MG/2ML IJ SOLN
1.0000 mg | INTRAMUSCULAR | Status: DC | PRN
  Administered 2016-04-02 (×2): 1 mg via INTRAVENOUS

## 2016-04-02 MED ORDER — PHENYLEPHRINE 40 MCG/ML (10ML) SYRINGE FOR IV PUSH (FOR BLOOD PRESSURE SUPPORT)
PREFILLED_SYRINGE | INTRAVENOUS | Status: AC
  Filled 2016-04-02: qty 10

## 2016-04-02 MED ORDER — ONDANSETRON HCL 4 MG/2ML IJ SOLN
INTRAMUSCULAR | Status: AC
  Filled 2016-04-02: qty 2

## 2016-04-02 MED ORDER — ONDANSETRON HCL 4 MG/2ML IJ SOLN
INTRAMUSCULAR | Status: DC | PRN
Start: 2016-04-02 — End: 2016-04-02
  Administered 2016-04-02: 4 mg via INTRAVENOUS

## 2016-04-02 MED ORDER — DEXAMETHASONE SODIUM PHOSPHATE 10 MG/ML IJ SOLN
INTRAMUSCULAR | Status: AC
  Filled 2016-04-02: qty 1

## 2016-04-02 MED ORDER — ATROPINE SULFATE 0.4 MG/ML IV SOSY
PREFILLED_SYRINGE | INTRAVENOUS | Status: AC
  Filled 2016-04-02: qty 2.5

## 2016-04-02 MED ORDER — LACTATED RINGERS IV SOLN
INTRAVENOUS | Status: DC
  Administered 2016-04-02 (×2): via INTRAVENOUS

## 2016-04-02 MED ORDER — LIDOCAINE 2% (20 MG/ML) 5 ML SYRINGE
INTRAMUSCULAR | Status: AC
  Filled 2016-04-02: qty 5

## 2016-04-02 MED ORDER — FENTANYL CITRATE (PF) 100 MCG/2ML IJ SOLN
25.0000 ug | INTRAMUSCULAR | Status: DC | PRN
  Administered 2016-04-02 (×2): 50 ug via INTRAVENOUS

## 2016-04-02 MED ORDER — EPHEDRINE SULFATE 50 MG/ML IJ SOLN
INTRAMUSCULAR | Status: DC | PRN
Start: 2016-04-02 — End: 2016-04-02
  Administered 2016-04-02 (×3): 10 mg via INTRAVENOUS

## 2016-04-02 MED ORDER — MIDAZOLAM HCL 2 MG/2ML IJ SOLN
INTRAMUSCULAR | Status: AC
  Filled 2016-04-02: qty 2

## 2016-04-02 MED ORDER — HEPARIN SOD (PORK) LOCK FLUSH 100 UNIT/ML IV SOLN
INTRAVENOUS | Status: AC
  Filled 2016-04-02: qty 5

## 2016-04-02 MED ORDER — FENTANYL CITRATE (PF) 100 MCG/2ML IJ SOLN
50.0000 ug | INTRAMUSCULAR | Status: DC | PRN
  Administered 2016-04-02 (×2): 50 ug via INTRAVENOUS

## 2016-04-02 MED ORDER — BUPIVACAINE HCL (PF) 0.25 % IJ SOLN
INTRAMUSCULAR | Status: DC | PRN
  Administered 2016-04-02: 4 mL

## 2016-04-02 MED ORDER — DEXAMETHASONE SODIUM PHOSPHATE 4 MG/ML IJ SOLN
INTRAMUSCULAR | Status: DC | PRN
  Administered 2016-04-02: 8 mg via INTRAVENOUS

## 2016-04-02 MED ORDER — LIDOCAINE HCL (CARDIAC) 20 MG/ML IV SOLN
INTRAVENOUS | Status: DC | PRN
  Administered 2016-04-02: 30 mg via INTRAVENOUS

## 2016-04-02 MED ORDER — BUPIVACAINE HCL (PF) 0.25 % IJ SOLN
INTRAMUSCULAR | Status: AC
  Filled 2016-04-02: qty 30

## 2016-04-02 SURGICAL SUPPLY — 47 items
BAG DECANTER FOR FLEXI CONT (MISCELLANEOUS) ×2 IMPLANT
BENZOIN TINCTURE PRP APPL 2/3 (GAUZE/BANDAGES/DRESSINGS) IMPLANT
BLADE SURG 11 STRL SS (BLADE) ×2 IMPLANT
BLADE SURG 15 STRL LF DISP TIS (BLADE) ×1 IMPLANT
BLADE SURG 15 STRL SS (BLADE) ×1
CANISTER SUCT 1200ML W/VALVE (MISCELLANEOUS) IMPLANT
CHLORAPREP W/TINT 26ML (MISCELLANEOUS) ×2 IMPLANT
COVER BACK TABLE 60X90IN (DRAPES) ×2 IMPLANT
COVER MAYO STAND STRL (DRAPES) ×2 IMPLANT
COVER PROBE 5X48 (MISCELLANEOUS) ×1
DECANTER SPIKE VIAL GLASS SM (MISCELLANEOUS) ×2 IMPLANT
DRAPE C-ARM 42X72 X-RAY (DRAPES) ×2 IMPLANT
DRAPE LAPAROSCOPIC ABDOMINAL (DRAPES) ×2 IMPLANT
DRAPE UTILITY XL STRL (DRAPES) ×2 IMPLANT
DRSG TEGADERM 4X4.75 (GAUZE/BANDAGES/DRESSINGS) IMPLANT
ELECT COATED BLADE 2.86 ST (ELECTRODE) ×2 IMPLANT
ELECT REM PT RETURN 9FT ADLT (ELECTROSURGICAL) ×2
ELECTRODE REM PT RTRN 9FT ADLT (ELECTROSURGICAL) ×1 IMPLANT
GLOVE BIO SURGEON STRL SZ7 (GLOVE) ×2 IMPLANT
GLOVE BIOGEL PI IND STRL 7.5 (GLOVE) ×1 IMPLANT
GLOVE BIOGEL PI INDICATOR 7.5 (GLOVE) ×1
GOWN STRL REUS W/ TWL LRG LVL3 (GOWN DISPOSABLE) ×2 IMPLANT
GOWN STRL REUS W/TWL LRG LVL3 (GOWN DISPOSABLE) ×2
IV KIT MINILOC 20X1 SAFETY (NEEDLE) IMPLANT
KIT CVR 48X5XPRB PLUP LF (MISCELLANEOUS) ×1 IMPLANT
KIT PORT POWER 8FR ISP CVUE (Catheter) ×2 IMPLANT
LIQUID BAND (GAUZE/BANDAGES/DRESSINGS) ×2 IMPLANT
NDL SAFETY ECLIPSE 18X1.5 (NEEDLE) IMPLANT
NEEDLE HYPO 18GX1.5 SHARP (NEEDLE)
NEEDLE HYPO 25X1 1.5 SAFETY (NEEDLE) ×2 IMPLANT
PACK BASIN DAY SURGERY FS (CUSTOM PROCEDURE TRAY) ×2 IMPLANT
PENCIL BUTTON HOLSTER BLD 10FT (ELECTRODE) ×2 IMPLANT
SLEEVE SCD COMPRESS KNEE MED (MISCELLANEOUS) ×2 IMPLANT
SPONGE GAUZE 4X4 12PLY STER LF (GAUZE/BANDAGES/DRESSINGS) ×2 IMPLANT
STRIP CLOSURE SKIN 1/2X4 (GAUZE/BANDAGES/DRESSINGS) IMPLANT
SUT MON AB 4-0 PC3 18 (SUTURE) ×2 IMPLANT
SUT PROLENE 2 0 SH DA (SUTURE) ×2 IMPLANT
SUT SILK 2 0 TIES 17X18 (SUTURE)
SUT SILK 2-0 18XBRD TIE BLK (SUTURE) IMPLANT
SUT VIC AB 3-0 SH 27 (SUTURE) ×1
SUT VIC AB 3-0 SH 27X BRD (SUTURE) ×1 IMPLANT
SYR 5ML LUER SLIP (SYRINGE) ×2 IMPLANT
SYR CONTROL 10ML LL (SYRINGE) ×2 IMPLANT
TOWEL OR 17X24 6PK STRL BLUE (TOWEL DISPOSABLE) ×2 IMPLANT
TOWEL OR NON WOVEN STRL DISP B (DISPOSABLE) ×2 IMPLANT
TUBE CONNECTING 20X1/4 (TUBING) IMPLANT
YANKAUER SUCT BULB TIP NO VENT (SUCTIONS) IMPLANT

## 2016-04-02 NOTE — Op Note (Signed)
Preoperative diagnosis: breast cancer need for venous access Postoperative diagnosis: same as above Procedure: right ij US guided powerport insertion Surgeon: Dr Serita Grammes EBL: minimal Anes: general  Specimens none Complications none Drains none Sponge count correct Dispo to pacu stable  Indications: This is a 54 yof due to begin systemic therapy for breast cancer. We discussed port placement.   Procedure: After informed consent was obtained the patient was taken to the operating room. She was given antibiotics. Sequential compression devices were on her legs. She was then placed under general anesthesia with an LMA. Then she was then prepped and draped in the standard sterile surgical fashion. Surgical timeout was then performed.  I used the ultrasound to identify the right internal jugular vein. I then accessed the vein using the ultrasound. this was with some difficulty as the vein was very flat. i attempted to place the wire a couple times and would not pass.  Eventually I was able to access the vein at a different spot and this aspirated blood. I then placed the wire.I did go much lower to gain access than I was previously.  she appeared to be very dehydrated today and every time I accessed the vein it collapsed under the ultrasound. This was confirmed by fluoroscopy to be in the correct position. I created a pocket on the right chest. I tunneled the line between the 2 sites. I then dilated the tract and placed the dilator assembly with the sheath. This was done under fluoroscopy. I then removed the sheath and dilator. The wire was also removed. The line was then pulled back to be in the distal cava. I hooked this up to the port. I sutured this into place with 2-0 Prolene in 2 places. This aspirated blood and flushed easily.This was confirmed with a final fluoroscopy. I then closed this with 2-0 Vicryl and 4-0 Monocryl. Dermabond was placed on both the incisions.She tolerated  this well and was transferred to the recovery room in stable condition.

## 2016-04-02 NOTE — Anesthesia Procedure Notes (Signed)
Procedure Name: LMA Insertion Date/Time: 04/02/2016 7:33 AM Performed by: Melynda Ripple D Pre-anesthesia Checklist: Patient identified, Emergency Drugs available, Suction available and Patient being monitored Patient Re-evaluated:Patient Re-evaluated prior to inductionOxygen Delivery Method: Circle System Utilized Preoxygenation: Pre-oxygenation with 100% oxygen Intubation Type: IV induction Ventilation: Mask ventilation without difficulty LMA: LMA inserted LMA Size: 4.0 Number of attempts: 1 Airway Equipment and Method: Bite block Placement Confirmation: positive ETCO2 Tube secured with: Tape Dental Injury: Teeth and Oropharynx as per pre-operative assessment

## 2016-04-02 NOTE — Progress Notes (Signed)
Called patient and left a voicemail to introduce myself as financial advocate, see if she had any financial questions or concerns and if she would be interested in signing up for assistance with Neulasta through [facility]. Left my return name and contact number.

## 2016-04-02 NOTE — Telephone Encounter (Signed)
Spoke with patient from [facility].  Patient is aware of all her appointments and doing well.  Encouraged her to call with any needs or concerns.

## 2016-04-02 NOTE — Interval H&P Note (Signed)
History and Physical Interval Note:  04/02/2016 7:19 AM  Alexis Fox  has presented today for surgery, with the diagnosis of BREAST CANCER  The various methods of treatment have been discussed with the patient and family. After consideration of risks, benefits and other options for treatment, the patient has consented to  Procedure(s): INSERTION PORT-A-CATH WITH [place] (N/A) as a surgical intervention .  The patient's history has been reviewed, patient examined, no change in status, stable for surgery.  I have reviewed the patient's chart and labs.  Questions were answered to the patient's satisfaction.     Kasch Borquez

## 2016-04-02 NOTE — H&P (Signed)
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 her density is b. this showed possible left breast mass. she underwent further evaluation that had [place] that showed numerous masses the largest are a 2.5x1.4x1.2 cm and 2.7x1.5x2.3 cm mass. the group of masses together measures over 6.4 cm. there are multiple nodes present with largest measuring 1.7 cm. core biopsy of node is positive. 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 she does not have mass or discharge. she is here with her husband today   Other Problems Maryfrances Bunnell; 04-27-16 8:06 AM) Anxiety Disorder Back Pain Cholelithiasis Depression Hemorrhoids  Past Surgical History Maryfrances Bunnell; 04-27-16 8:06 AM) Foot Surgery Bilateral. Knee Surgery Left. Spinal Surgery - Lower Back  Diagnostic Studies History Maryfrances Bunnell; 04/27/2016 8:06 AM) Colonoscopy 5-10 years ago Mammogram within last year 1-3 years ago Pap Smear 1-5 years ago  Medication History Maryfrances Bunnell; 04-27-2016 8:06 AM) Medications Reconciled  Social History Maryfrances Bunnell; 04-27-16 8:06 AM) Alcohol use Occasional alcohol use. Caffeine use Carbonated beverages, Tea. No drug use Tobacco use Never smoker.  Family History Maryfrances Bunnell; Apr 27, 2016 8:06 AM) Alcohol Abuse Brother, Father, Mother, Sister. Arthritis Father, Mother. Cancer Father. Cerebrovascular Accident Mother. Depression Mother. Heart Disease Brother, Mother. Hypertension Father, Mother. Migraine Headache Daughter. Respiratory Condition Mother.  Pregnancy / Birth History Maryfrances Bunnell; 27-Apr-2016 8:06 AM) Age at menarche 60 years. Age of menopause 92-55 42-49 Contraceptive History Oral contraceptives. Gravida 3 Maternal age 75-20 Para 2    Review of Systems Maryfrances Bunnell; Apr 27, 2016 8:06 AM) General Present- Appetite Loss and Fatigue. Not Present-  Chills, Fever, Night Sweats, Weight Gain and Weight Loss. Skin Not Present- Change in Wart/Mole, Dryness, Hives, Jaundice, New Lesions, Non-Healing Wounds, Rash and Ulcer. HEENT Present- Seasonal Allergies and Wears glasses/contact lenses. Not Present- Earache, Hearing Loss, Hoarseness, Nose Bleed, Oral Ulcers, Ringing in the Ears, Sinus Pain, Sore Throat, Visual Disturbances and Yellow Eyes. Breast Present- Skin Changes. Not Present- Breast Mass, Breast Pain and Nipple Discharge. Cardiovascular Not Present- Chest Pain, Difficulty Breathing Lying Down, Leg Cramps, Palpitations, Rapid Heart Rate, Shortness of Breath and Swelling of Extremities. Gastrointestinal Present- Abdominal Pain and Hemorrhoids. Not Present- Bloating, Bloody Stool, Change in Bowel Habits, Chronic diarrhea, Constipation, Difficulty Swallowing, Excessive gas, Gets full quickly at meals, Indigestion, Nausea, Rectal Pain and Vomiting. Female Genitourinary Not Present- Frequency, Nocturia, Painful Urination, Pelvic Pain and Urgency. Musculoskeletal Present- Back Pain. Not Present- Joint Pain, Joint Stiffness, Muscle Pain, Muscle Weakness and Swelling of Extremities. Neurological Not Present- Decreased Memory, Fainting, Headaches, Numbness, Seizures, Tingling, Tremor, Trouble walking and Weakness. Psychiatric Present- Anxiety, Depression and Frequent crying. Not Present- Bipolar, Change in Sleep Pattern and Fearful. Endocrine Present- Hot flashes. Not Present- Cold Intolerance, Excessive Hunger, Hair Changes, Heat Intolerance and New Diabetes. Hematology Not Present- Easy Bruising, Excessive bleeding, Gland problems, HIV and Persistent Infections.   Physical Exam Rolm Bookbinder MD; 04/27/2016 2:53 PM) General Mental Status-Alert. Orientation-Oriented X3.  Chest and Lung Exam Chest and lung exam reveals -on auscultation, normal breath sounds, no adventitious sounds and normal vocal resonance.  Breast Nipples-No  Discharge. Note: left uoq hematoma   Cardiovascular Cardiovascular examination reveals -normal heart sounds, regular rate and rhythm with no murmurs.  Lymphatic Head & Neck  General Head & Neck Lymphatics: Bilateral - Description - Normal. Axillary  General Axillary Region: Bilateral - Description - Normal. Note: no [place] adenopathy  Assessment & Plan Rolm Bookbinder MD; 03/28/2016 2:57 PM) BREAST CANCER OF UPPER-OUTER QUADRANT OF LEFT FEMALE BREAST (C50.412) Story: Port placement, MRI, genetic testing, primary chemotherapy discussed staging and pathophyisiology of breast caner as well as all treatment options. she has at least stage II disease now. I think beneficial to begin with primary systemic therapy, obtain mri and then plan surgery ater that. discussed port placement with risks,recovery and will do that next week. I discussed surgical options including tad/alnd alliance trial. I also discussed lumpectomy, reduction lumpectomy and mastectomy. we will make final decision on surgery at completion of chemotherapy along with genetics info and mri.

## 2016-04-02 NOTE — Anesthesia Preprocedure Evaluation (Signed)
Anesthesia Evaluation  Patient identified by MRN, date of birth, ID band Patient awake    Reviewed: Allergy & Precautions, NPO status , Patient's Chart, lab work & pertinent test results  Airway Mallampati: I  TM Distance: >3 FB Neck ROM: Full    Dental  (+) Teeth Intact, Dental Advisory Given   Pulmonary  breath sounds clear to auscultation        Cardiovascular Rhythm:Regular Rate:Normal     Neuro/Psych    GI/Hepatic   Endo/Other    Renal/GU      Musculoskeletal   Abdominal   Peds  Hematology   Anesthesia Other Findings   Reproductive/Obstetrics                             Anesthesia Physical Anesthesia Plan  ASA: II  Anesthesia Plan: General   Post-op Pain Management:    Induction: Intravenous  Airway Management Planned: LMA  Additional Equipment:   Intra-op Plan:   Post-operative Plan: Extubation in OR  Informed Consent: I have reviewed the patients History and Physical, chart, labs and discussed the procedure including the risks, benefits and alternatives for the proposed anesthesia with the patient or authorized representative who has indicated his/her understanding and acceptance.   Dental advisory given  Plan Discussed with: CRNA, Anesthesiologist and Surgeon  Anesthesia Plan Comments:         Anesthesia Quick Evaluation  

## 2016-04-02 NOTE — Telephone Encounter (Signed)
Left pt message to make new pt appt 

## 2016-04-02 NOTE — Discharge Instructions (Signed)

## 2016-04-02 NOTE — Anesthesia Postprocedure Evaluation (Signed)
Anesthesia Post Note  Patient: Alexis Fox  Procedure(s) Performed: Procedure(s) (LRB): INSERTION PORT-A-CATH WITH [place] (Right)  Patient location during evaluation: PACU Anesthesia Type: General Level of consciousness: awake and alert Pain management: pain level controlled Vital Signs Assessment: post-procedure vital signs reviewed and stable Respiratory status: spontaneous breathing, nonlabored ventilation and respiratory function stable Cardiovascular status: blood pressure returned to baseline and stable Postop Assessment: no signs of nausea or vomiting Anesthetic complications: no    Last Vitals:  Filed Vitals:   04/02/16 0900 04/02/16 0915  BP: 106/55 110/57  Pulse: 65 72  Temp:    Resp: 10 20    Last Pain:  Filed Vitals:   04/02/16 0919  PainSc: 2                  Clark Cuff A

## 2016-04-02 NOTE — Transfer of Care (Signed)
Immediate Anesthesia Transfer of Care Note  Patient: Alexis Fox  Procedure(s) Performed: Procedure(s): INSERTION PORT-A-CATH WITH [place] (Right)  Patient Location: PACU  Anesthesia Type:General  Level of Consciousness: awake, alert  and oriented  Airway & Oxygen Therapy: Patient Spontanous Breathing and Patient connected to face mask oxygen  Post-op Assessment: Report given to RN and Post -op Vital signs reviewed and stable  Post vital signs: Reviewed and stable  Last Vitals:  Filed Vitals:   04/02/16 0638  BP: 123/70  Pulse: 61  Temp: 36.7 C  Resp: 18    Last Pain: There were no vitals filed for this visit.       Complications: No apparent anesthesia complications

## 2016-04-03 ENCOUNTER — Other Ambulatory Visit: Payer: 59

## 2016-04-03 ENCOUNTER — Encounter (HOSPITAL_BASED_OUTPATIENT_CLINIC_OR_DEPARTMENT_OTHER): Payer: Self-pay | Admitting: General Surgery

## 2016-04-03 ENCOUNTER — Encounter: Payer: Self-pay | Admitting: Hematology and Oncology

## 2016-04-03 ENCOUNTER — Ambulatory Visit (HOSPITAL_COMMUNITY)
Admission: RE | Admit: 2016-04-03 | Discharge: 2016-04-03 | Disposition: A | Discharge status: Home / Self Care | Payer: 59 | Source: Ambulatory Visit | Attending: Hematology and Oncology | Admitting: Hematology and Oncology

## 2016-04-03 DIAGNOSIS — C50412 Malignant neoplasm of upper-outer quadrant of left female breast: Secondary | ICD-10-CM | POA: Insufficient documentation

## 2016-04-03 DIAGNOSIS — D0512 Intraductal carcinoma in situ of left breast: Secondary | ICD-10-CM | POA: Diagnosis not present

## 2016-04-03 MED ORDER — GADOBENATE DIMEGLUMINE 529 MG/ML IV SOLN
19.0000 mL | Freq: Once | INTRAVENOUS | Status: AC | PRN
  Administered 2016-04-03: 19 mL via INTRAVENOUS

## 2016-04-03 MED FILL — traMADol HCL 50 MG TABS: 50 | 5 days supply | Qty: 20 | Fill #0

## 2016-04-03 NOTE — Progress Notes (Signed)
Left for dr. Andrena Mews and Holland Falling.

## 2016-04-03 NOTE — Progress Notes (Signed)
Left for patient pick up at visit at front with ms wilma

## 2016-04-03 NOTE — Progress Notes (Signed)
Patient came in to my office in response to the voicemail I left her on yesterday. Introduced myself as her financial advocate and asked patient if she was familiar with her insurance plan and how it works with her treatment plan. Patient states she has [employer] UMR plan and she has not met anything yet. I advised patient that with this being the case, the insurance would pay at 80% and her responsibility would be 20% until OOP has been met. I also advised her that my role is to help find assistance for the 20% if she needs it. Patient states she needs the help. Patient was very emotional. I explained information about the [employer] First Step program for [place] and asked if she would like to enroll. Patient states yes. Had patient to sign the application. Enrolled patient online, patient was approved.Faxed enrollment form to [phone]. Fax received ok per confirmation sheet. I asked patient if she had been referred to [employer]. Patient states she spoke with them on yesterday about the classes that are offered. Advised patient that other foundations were income specific and if she becomes interested in applying, I would need proof of her household gross yearly income for herself and spouse. I asked patient if she had internet access and she does so I gave her the name of the Patient [employer] for copay assistance which she could research online that has funds available. I advised her that this would show her where the income amount comes in to play and she could apply online at that time if interested. Gave patient my card and advised her that if she has any additional financial questions or concerns to contact me. I also went over the qualifications of the [employer] and asked if the income was under the [place] guidelines and she said not right now but it would be. Gave her an expense sheet to review if she later becomes interested in applying. Advised patient that she may apply once the changes take  place. Patient states she had questions about FMLA and disability forms. I let her know that Raquel handles this process. I went to get Raquel who came to my office to see the patient. Once done, I asked patient was there anything else that I could help her with. She said no and thanked me for my help.

## 2016-04-04 ENCOUNTER — Encounter: Payer: Self-pay | Admitting: *Deleted

## 2016-04-04 ENCOUNTER — Encounter (HOSPITAL_COMMUNITY): Payer: 59

## 2016-04-04 ENCOUNTER — Ambulatory Visit (HOSPITAL_COMMUNITY): Payer: 59

## 2016-04-04 ENCOUNTER — Ambulatory Visit (HOSPITAL_BASED_OUTPATIENT_CLINIC_OR_DEPARTMENT_OTHER)
Admission: RE | Admit: 2016-04-04 | Discharge: 2016-04-04 | Disposition: A | Discharge status: Home / Self Care | Payer: 59 | Source: Ambulatory Visit | Attending: Hematology and Oncology | Admitting: Hematology and Oncology

## 2016-04-04 ENCOUNTER — Ambulatory Visit (HOSPITAL_COMMUNITY)
Admission: RE | Admit: 2016-04-04 | Discharge: 2016-04-04 | Disposition: A | Discharge status: Home / Self Care | Payer: 59 | Source: Ambulatory Visit | Attending: Hematology and Oncology | Admitting: Hematology and Oncology

## 2016-04-04 DIAGNOSIS — C50412 Malignant neoplasm of upper-outer quadrant of left female breast: Secondary | ICD-10-CM

## 2016-04-04 DIAGNOSIS — Z006 Encounter for examination for normal comparison and control in clinical research program: Secondary | ICD-10-CM

## 2016-04-04 MED ORDER — INV-ATORVASTATIN/PLACEBO 40 MG TABS WAKE FOREST WF 98213: 1.0000 | ORAL_TABLET | Freq: Every day | ORAL | Status: DC

## 2016-04-04 NOTE — Progress Notes (Signed)
04/04/16 at 1:36pm- [place]- The research nurse met with the pt and her husband after her cardiac MRI this morning.  The pt was given her study drug bottle (180 pills/per bottle) for self administration.  The pt was instructed to take her first dose this evening at 7 pm, and the 2nd dose tomorrow at 7 am.  The pt was told to record both of her pills on the medication diary.  The pt was given medication calendars for May through December 2017 to record her daily dose of study drug.  The pt was informed that she needs to take the pill with or without food around the same time of day.  The pt verbalized understanding.  The pt was informed that her baseline labs will be drawn prior to her first chemo tomorrow. The pt requested that all of her blood be drawn from her port.  The research nurse contacted Dr. Geralyn Flash nurse to relay the message that the pt wants her labs drawn from her port.  The pt had no questions/concerns for the research nurse.  The pt is aware that she needs 2 doses of study drug prior to her first chemo tomorrow.  The pt knows that she needs to mark her diary with a "0" when she misses her daily dose.  The pt was encouraged to write the date and why she missed any of her scheduled study drug doses.  The pt was thanked for her continued support of this study.  Brion Aliment RN, BSN, CCRP Clinical Research Nurse 04/04/2016 1:51 PM   04/04/16 at 4:04pm - The research nurse verified that Rives received the MRI scan successfully.  The research nurse then picked up the Encounter Form from the MRI department and then faxed it to the reading room.  The research nurse also contacted the pt and told her that Beverlee Nims, Dr. Geralyn Flash nurse, stated that her labs did not need to be drawn tomorrow.  The pt's research labs (baseline) will be drawn from her new port during her infusion appointment before her chemo administration.  The pt was very appreciative that she would not have to be "stuck" twice for labs.  The  research nurse will see the pt tomorrow to confirm that she took her 2 study drug doses.   Brion Aliment RN, BSN, [place] Nurse 04/04/2016 4:08 PM   04/05/16 at 9:52am - The pt was into the cancer center for her first chemo today.  The pt confirmed that she took 2 doses of her study drug (atorvastatin/placebo) 12 hours apart.  The pt was thanked for her compliance.  The pt knows to record her daily dose on her medication calendars.  The pt's baseline research labs were drawn today before her first chemotherapy.  The research nurse will follow up with the pt at her next office visit to assess her toxicities since starting study drug.  Brion Aliment RN, BSN, CCRP Clinical Research Nurse 04/05/2016 9:53 AM

## 2016-04-04 NOTE — Progress Notes (Signed)
  Echocardiogram 2D Echocardiogram has been performed.  Alexis Fox 04/04/2016, 11:01 AM

## 2016-04-05 ENCOUNTER — Encounter: Payer: Self-pay | Admitting: Hematology and Oncology

## 2016-04-05 ENCOUNTER — Ambulatory Visit: Payer: 59

## 2016-04-05 ENCOUNTER — Ambulatory Visit (HOSPITAL_BASED_OUTPATIENT_CLINIC_OR_DEPARTMENT_OTHER): Payer: 59 | Admitting: Hematology and Oncology

## 2016-04-05 ENCOUNTER — Telehealth: Payer: Self-pay | Admitting: *Deleted

## 2016-04-05 ENCOUNTER — Ambulatory Visit (HOSPITAL_BASED_OUTPATIENT_CLINIC_OR_DEPARTMENT_OTHER): Payer: 59

## 2016-04-05 ENCOUNTER — Other Ambulatory Visit: Payer: 59

## 2016-04-05 VITALS — BP 136/67 | HR 73 | Temp 97.5°F | Resp 18 | Ht 64.0 in | Wt 203.3 lb

## 2016-04-05 VITALS — BP 132/65 | HR 69 | Temp 98.9°F | Resp 16

## 2016-04-05 DIAGNOSIS — C50412 Malignant neoplasm of upper-outer quadrant of left female breast: Secondary | ICD-10-CM | POA: Diagnosis not present

## 2016-04-05 DIAGNOSIS — Z171 Estrogen receptor negative status [ER-]: Secondary | ICD-10-CM

## 2016-04-05 DIAGNOSIS — Z5111 Encounter for antineoplastic chemotherapy: Secondary | ICD-10-CM | POA: Diagnosis not present

## 2016-04-05 DIAGNOSIS — Z006 Encounter for examination for normal comparison and control in clinical research program: Secondary | ICD-10-CM | POA: Diagnosis not present

## 2016-04-05 LAB — RESEARCH LABS

## 2016-04-05 MED ORDER — SODIUM CHLORIDE 0.9 % IV SOLN
Freq: Once | INTRAVENOUS | Status: AC
  Administered 2016-04-05: 11:00:00 via INTRAVENOUS

## 2016-04-05 MED ORDER — PEGFILGRASTIM 6 MG/0.6ML ~~LOC~~ PSKT
6.0000 mg | PREFILLED_SYRINGE | Freq: Once | SUBCUTANEOUS | Status: AC
  Administered 2016-04-05: 6 mg via SUBCUTANEOUS
  Filled 2016-04-05: qty 0.6

## 2016-04-05 MED ORDER — DOXORUBICIN HCL CHEMO IV INJECTION 2 MG/ML
60.0000 mg/m2 | Freq: Once | INTRAVENOUS | Status: AC
  Administered 2016-04-05: 122 mg via INTRAVENOUS
  Filled 2016-04-05: qty 61

## 2016-04-05 MED ORDER — HEPARIN SOD (PORK) LOCK FLUSH 100 UNIT/ML IV SOLN
500.0000 [IU] | Freq: Once | INTRAVENOUS | Status: AC | PRN
  Administered 2016-04-05: 500 [IU]
  Filled 2016-04-05: qty 5

## 2016-04-05 MED ORDER — SODIUM CHLORIDE 0.9 % IV SOLN
600.0000 mg/m2 | Freq: Once | INTRAVENOUS | Status: AC
  Administered 2016-04-05: 1220 mg via INTRAVENOUS
  Filled 2016-04-05: qty 61

## 2016-04-05 MED ORDER — SODIUM CHLORIDE 0.9% FLUSH
10.0000 mL | INTRAVENOUS | Status: DC | PRN
  Administered 2016-04-05: 10 mL
  Filled 2016-04-05: qty 10

## 2016-04-05 MED ORDER — PALONOSETRON HCL INJECTION 0.25 MG/5ML
INTRAVENOUS | Status: AC
  Filled 2016-04-05: qty 5

## 2016-04-05 MED ORDER — SODIUM CHLORIDE 0.9 % IV SOLN
Freq: Once | INTRAVENOUS | Status: AC
  Administered 2016-04-05: 12:00:00 via INTRAVENOUS
  Filled 2016-04-05: qty 5

## 2016-04-05 MED ORDER — PALONOSETRON HCL INJECTION 0.25 MG/5ML
0.2500 mg | Freq: Once | INTRAVENOUS | Status: AC
  Administered 2016-04-05: 0.25 mg via INTRAVENOUS

## 2016-04-05 NOTE — Assessment & Plan Note (Signed)
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 Neoadjuvant chemotherapy with Adriamycin and Cytoxan dose dense 4 followed by Abraxane weekly 12 2. Followed by breast conserving surgery with sentinel lymph node study vs targeted axillary dissection 3. Followed by adjuvant radiation therapy ------------------------------------------------------------------------------------------------------------------------------------------- Current treatment: Today cycle 1 day 1 dose dense Adriamycin and Cytoxan Antiemetics were reviewed in detail Echocardiogram 04/04/2016: EF 60-65%  PREVENT trial: [id number]: Patient is enrolled in this trial atorvastatin versus placebo   Monitoring closely for toxicities Return to clinic in 1 week for toxicity check

## 2016-04-05 NOTE — Telephone Encounter (Signed)
Error

## 2016-04-05 NOTE — Progress Notes (Signed)
Patient Care Team: [name], MD as PCP - General (Internal Medicine)  DIAGNOSIS: Breast cancer of upper-outer quadrant of left female breast [facility])   Staging form: Breast, AJCC 7th Edition     Clinical stage from 03/28/2016: Stage IIB (T2, N1, M0) - Unsigned   SUMMARY OF ONCOLOGIC HISTORY:   Breast cancer of upper-outer quadrant of left female breast ([place])   03/02/2016 Mammogram Hypoechoic masses in the upper outer quadrant left breast 2 adjacent irregular masses 2.5-1.4 x 1.2 cm and the other 2.7 x 1.4 x 2.3 cm total span 6.4 cm, enlarged multiple axillary lymph nodes measuring 1.7 cm, T2 N1/N2 stage IIb vs IIIa   03/19/2016 Initial Diagnosis Left breast biopsy 12:30 position: Invasive ductal carcinoma with DCIS, 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 She underwent port placement and appears to be slightly sore from it. She failed all her medication prescriptions, echocardiogram was performed and it was normal. She is here today to start her first cycle of chemotherapy.she also consented to participate in PREVENT clinical trial.  REVIEW OF SYSTEMS:   Constitutional: Denies fevers, chills or abnormal weight loss Eyes: Denies blurriness of vision Ears, nose, mouth, throat, and face: Denies mucositis or sore throat Respiratory: Denies cough, dyspnea or wheezes Cardiovascular: Denies palpitation, chest discomfort Gastrointestinal:  Denies nausea, heartburn or change in bowel  habits Skin: Denies abnormal skin rashes Lymphatics: Denies new lymphadenopathy or easy bruising Neurological:Denies numbness, tingling or new weaknesses Behavioral/Psych: Mood is stable, no new changes  Extremities: No lower extremity edema  All other systems were reviewed with the patient and are negative.  I have reviewed the past medical history, past surgical history, social history and family history with the patient and they are unchanged from previous note.  ALLERGIES:  has No Known Allergies.  MEDICATIONS:  Current Outpatient Prescriptions  Medication Sig Dispense Refill  . Atorvastatin Calcium (INVESTIGATIONAL ATORVASTATIN/PLACEBO) 40 MG tablet [facility] [id number] Take 1 tablet by mouth daily. Take 2 doses (these doses must be 12 hours apart) prior to first chemotherapy treatment. Then take 1 tablet daily by mouth with or without food. 180 tablet 0  . buPROPion (WELLBUTRIN SR) 150 MG 12 hr tablet Take 150 mg by mouth daily.    . citalopram (CELEXA) 40 MG tablet   3  . clonazePAM (KLONOPIN) 1 MG tablet Take 0.5 mg by mouth at bedtime.     Marland Kitchen dexamethasone (DECADRON) 4 MG tablet Take 1 tablet (4 mg total) by mouth 2 (two) times daily. 1 tab two times daily for 2 days. Take with food. 30 tablet 1  . lidocaine-prilocaine (EMLA) cream Apply to affected area once 30 g 3  . loratadine (CLARITIN) 10 MG tablet Take 10 mg by mouth daily.    Marland Kitchen LORazepam (ATIVAN) 0.5 MG tablet Take 1 tablet (0.5 mg total) by mouth at bedtime. 30 tablet 0  . nystatin-triamcinolone ointment (MYCOLOG) Apply 1 application topically 2 (two) times daily. 60 g 3  . ondansetron (ZOFRAN) 8 MG tablet Take 1  tablet (8 mg total) by mouth 2 (two) times daily as needed. Start on the third day after chemotherapy. 30 tablet 1  . pramipexole (MIRAPEX) 0.5 MG tablet Take 0.5 mg by mouth at bedtime.    . prochlorperazine (COMPAZINE) 10 MG tablet Take 1 tablet (10 mg total) by mouth every 6 (six) hours as needed (Nausea or  vomiting). 30 tablet 1  . traMADol (ULTRAM) 50 MG tablet Take 1 tablet (50 mg total) by mouth every 6 (six) hours as needed. 20 tablet 0  . zolpidem (AMBIEN) 5 MG tablet Take 5 mg by mouth at bedtime.     No current facility-administered medications for this visit.    PHYSICAL EXAMINATION: ECOG PERFORMANCE STATUS: 0 - Asymptomatic  Filed Vitals:   04/05/16 1004  BP: 136/67  Pulse: 73  Temp: 97.5 F (36.4 C)  Resp: 18   Filed Weights   04/05/16 1004  Weight: 203 lb 4.8 oz (92.216 kg)    GENERAL:alert, no distress and comfortable SKIN: skin color, texture, turgor are normal, no rashes or significant lesions EYES: normal, Conjunctiva are pink and non-injected, sclera clear OROPHARYNX:no exudate, no erythema and lips, buccal mucosa, and tongue normal  NECK: supple, thyroid normal size, non-tender, without nodularity LYMPH:  no palpable lymphadenopathy in the cervical, axillary or inguinal LUNGS: clear to auscultation and percussion with normal breathing effort HEART: regular rate & rhythm and no murmurs and no lower extremity edema ABDOMEN:abdomen soft, non-tender and normal bowel sounds MUSCULOSKELETAL:no cyanosis of digits and no clubbing  NEURO: alert & oriented x 3 with fluent speech, no focal motor/sensory deficits EXTREMITIES: No lower extremity edema LABORATORY DATA:  I have reviewed the data as listed   Chemistry      Component Value Date/Time   NA 141 03/28/2016 0821   NA 138 10/06/2015 0616   K 4.3 03/28/2016 0821   K 4.1 10/06/2015 0616   CL 105 10/06/2015 0616   CO2 28 03/28/2016 0821   CO2 27 10/06/2015 0616   BUN 13.2 03/28/2016 0821   BUN 14 10/06/2015 0616   CREATININE 1.0 03/28/2016 0821   CREATININE 1.09* 10/06/2015 0616      Component Value Date/Time   CALCIUM 9.5 03/28/2016 0821   CALCIUM 8.6* 10/06/2015 0616   ALKPHOS 62 03/28/2016 0821   ALKPHOS 92 10/06/2015 0616   AST 14 03/28/2016 0821   AST 61* 10/06/2015 0616   ALT 12 03/28/2016 0821    ALT 188* 10/06/2015 0616   BILITOT 0.47 03/28/2016 0821   BILITOT 0.5 10/06/2015 0616       Lab Results  Component Value Date   WBC 5.8 03/28/2016   HGB 13.5 03/28/2016   HCT 41.6 03/28/2016   MCV 89.5 03/28/2016   PLT 250 03/28/2016   NEUTROABS 3.2 03/28/2016   ASSESSMENT & PLAN:  Breast cancer of upper-outer quadrant of left female breast ([place]) Mammogram 03/02/2016: Hypoechoic masses in the upper outer quadrant left breast 2 adjacent irregular masses 2.5-1.4 x 1.2 cm and the other 2.7 x 1.4 x 2.3 cm total span 6.4 cm, enlarged multiple axillary lymph nodes measuring 1.7 cm, T2 N1/N2 stage IIb vs IIIa Left breast biopsy 03/19/2016 12:30 position: Invasive ductal carcinoma with DCIS, 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 Neoadjuvant chemotherapy with Adriamycin and Cytoxan dose dense 4 followed by Abraxane weekly 12 2. Followed by breast conserving surgery with sentinel lymph node  study vs targeted axillary dissection 3. Followed by adjuvant radiation therapy ------------------------------------------------------------------------------------------------------------------------------------------- Current treatment: Today cycle 1 day 1 dose dense Adriamycin and Cytoxan Antiemetics were reviewed in detail Echocardiogram 04/04/2016: EF 60-65%  PREVENT trial: [id number]: Patient is enrolled in this trial atorvastatin versus placebo   Monitoring closely for toxicities Return to clinic in 1 week for toxicity check   No orders of the defined types were placed in this encounter.   The patient has a good understanding of the overall plan. she agrees with it. she will call with any problems that may develop before the next visit here.   Rulon Eisenmenger, MD 04/05/2016

## 2016-04-05 NOTE — Patient Instructions (Signed)
[facility] Discharge Instructions for Patients Receiving Chemotherapy  Today you received the following chemotherapy agents:  Adriamycin and Cytoxan.  To help prevent nausea and vomiting after your treatment, we encourage you to take your nausea medication as prescribed.   If you develop nausea and vomiting that is not controlled by your nausea medication, call the clinic.   BELOW ARE SYMPTOMS THAT SHOULD BE REPORTED IMMEDIATELY:  *FEVER GREATER THAN 100.5 F  *CHILLS WITH OR WITHOUT FEVER  NAUSEA AND VOMITING THAT IS NOT CONTROLLED WITH YOUR NAUSEA MEDICATION  *UNUSUAL SHORTNESS OF BREATH  *UNUSUAL BRUISING OR BLEEDING  TENDERNESS IN MOUTH AND THROAT WITH OR WITHOUT PRESENCE OF ULCERS  *URINARY PROBLEMS  *BOWEL PROBLEMS  UNUSUAL RASH Items with * indicate a potential emergency and should be followed up as soon as possible.  Feel free to call the clinic you have any questions or concerns. The clinic phone number is [phone].  Please show the [facility] at check-in to the Emergency Department and triage nurse.  Doxorubicin injection What is this medicine? DOXORUBICIN (dox oh ROO bi sin) is a chemotherapy drug. It is used to treat many kinds of cancer like Hodgkin's disease, leukemia, non-Hodgkin's lymphoma, neuroblastoma, sarcoma, and Wilms' tumor. It is also used to treat bladder cancer, breast cancer, lung cancer, ovarian cancer, stomach cancer, and thyroid cancer. This medicine may be used for other purposes; ask your health care provider or pharmacist if you have questions. What should I tell my health care provider before I take this medicine? They need to know if you have any of these conditions: -blood disorders -heart disease, recent heart attack -infection (especially a virus infection such as chickenpox, cold sores, or herpes) -irregular heartbeat -liver disease -recent or ongoing radiation therapy -an unusual or allergic reaction to  doxorubicin, other chemotherapy agents, other medicines, foods, dyes, or preservatives -pregnant or trying to get pregnant -breast-feeding How should I use this medicine? This drug is given as an infusion into a vein. It is administered in a hospital or clinic by a specially trained health care professional. If you have pain, swelling, burning or any unusual feeling around the site of your injection, tell your health care professional right away. Talk to your pediatrician regarding the use of this medicine in children. Special care may be needed. Overdosage: If you think you have taken too much of this medicine contact a poison control center or emergency room at once. NOTE: This medicine is only for you. Do not share this medicine with others. What if I miss a dose? It is important not to miss your dose. Call your doctor or health care professional if you are unable to keep an appointment. What may interact with this medicine? Do not take this medicine with any of the following medications: -cisapride -droperidol -halofantrine -pimozide -zidovudine This medicine may also interact with the following medications: -chloroquine -chlorpromazine -clarithromycin -cyclophosphamide -cyclosporine -erythromycin -medicines for depression, anxiety, or psychotic disturbances -medicines for irregular heart beat like amiodarone, bepridil, dofetilide, encainide, flecainide, propafenone, quinidine -medicines for seizures like ethotoin, fosphenytoin, phenytoin -medicines for nausea, vomiting like dolasetron, ondansetron, palonosetron -medicines to increase blood counts like filgrastim, pegfilgrastim, sargramostim -methadone -methotrexate -pentamidine -progesterone -vaccines -verapamil Talk to your doctor or health care professional before taking any of these medicines: -acetaminophen -aspirin -ibuprofen -ketoprofen -naproxen This list may not describe all possible interactions. Give your  health care provider a list of all the medicines, herbs, non-prescription drugs, or dietary supplements you use. Also  tell them if you smoke, drink alcohol, or use illegal drugs. Some items may interact with your medicine. What should I watch for while using this medicine? Your condition will be monitored carefully while you are receiving this medicine. You will need important blood work done while you are taking this medicine. This drug may make you feel generally unwell. This is not uncommon, as chemotherapy can affect healthy cells as well as cancer cells. Report any side effects. Continue your course of treatment even though you feel ill unless your doctor tells you to stop. Your urine may turn red for a few days after your dose. This is not blood. If your urine is dark or brown, call your doctor. In some cases, you may be given additional medicines to help with side effects. Follow all directions for their use. Call your doctor or health care professional for advice if you get a fever, chills or sore throat, or other symptoms of a cold or flu. Do not treat yourself. This drug decreases your body's ability to fight infections. Try to avoid being around people who are sick. This medicine may increase your risk to bruise or bleed. Call your doctor or health care professional if you notice any unusual bleeding. Be careful brushing and flossing your teeth or using a toothpick because you may get an infection or bleed more easily. If you have any dental work done, tell your dentist you are receiving this medicine. Avoid taking products that contain aspirin, acetaminophen, ibuprofen, naproxen, or ketoprofen unless instructed by your doctor. These medicines may hide a fever. Men and women of childbearing age should use effective birth control methods while using taking this medicine. Do not become pregnant while taking this medicine. There is a potential for serious side effects to an unborn child. Talk to  your health care professional or pharmacist for more information. Do not breast-feed an infant while taking this medicine. Do not let others touch your urine or other body fluids for 5 days after each treatment with this medicine. Caregivers should wear latex gloves to avoid touching body fluids during this time. There is a maximum amount of this medicine you should receive throughout your life. The amount depends on the medical condition being treated and your overall health. Your doctor will watch how much of this medicine you receive in your lifetime. Tell your doctor if you have taken this medicine before. What side effects may I notice from receiving this medicine? Side effects that you should report to your doctor or health care professional as soon as possible: -allergic reactions like skin rash, itching or hives, swelling of the face, lips, or tongue -low blood counts - this medicine may decrease the number of white blood cells, red blood cells and platelets. You may be at increased risk for infections and bleeding. -signs of infection - fever or chills, cough, sore throat, pain or difficulty passing urine -signs of decreased platelets or bleeding - bruising, pinpoint red spots on the skin, black, tarry stools, blood in the urine -signs of decreased red blood cells - unusually weak or tired, fainting spells, lightheadedness -breathing problems -chest pain -fast, irregular heartbeat -mouth sores -nausea, vomiting -pain, swelling, redness at site where injected -pain, tingling, numbness in the hands or feet -swelling of ankles, feet, or hands -unusual bleeding or bruising Side effects that usually do not require medical attention (report to your doctor or health care professional if they continue or are bothersome): -diarrhea -facial flushing -hair loss -loss of   appetite -missed menstrual periods -nail discoloration or damage -red or watery eyes -red colored urine -stomach  upset This list may not describe all possible side effects. Call your doctor for medical advice about side effects. You may report side effects to FDA at 1-800-FDA-1088. Where should I keep my medicine? This drug is given in a hospital or clinic and will not be stored at home. NOTE: This sheet is a summary. It may not cover all possible information. If you have questions about this medicine, talk to your doctor, pharmacist, or health care provider.    2016, Elsevier/Gold Standard. (2013-03-17 09:54:34)  Cyclophosphamide injection What is this medicine? CYCLOPHOSPHAMIDE (sye kloe FOSS fa mide) is a chemotherapy drug. It slows the growth of cancer cells. This medicine is used to treat many types of cancer like lymphoma, myeloma, leukemia, breast cancer, and ovarian cancer, to name a few. This medicine may be used for other purposes; ask your health care provider or pharmacist if you have questions. What should I tell my health care provider before I take this medicine? They need to know if you have any of these conditions: -blood disorders -history of other chemotherapy -infection -kidney disease -liver disease -recent or ongoing radiation therapy -tumors in the bone marrow -an unusual or allergic reaction to cyclophosphamide, other chemotherapy, other medicines, foods, dyes, or preservatives -pregnant or trying to get pregnant -breast-feeding How should I use this medicine? This drug is usually given as an injection into a vein or muscle or by infusion into a vein. It is administered in a hospital or clinic by a specially trained health care professional. Talk to your pediatrician regarding the use of this medicine in children. Special care may be needed. Overdosage: If you think you have taken too much of this medicine contact a poison control center or emergency room at once. NOTE: This medicine is only for you. Do not share this medicine with others. What if I miss a dose? It is  important not to miss your dose. Call your doctor or health care professional if you are unable to keep an appointment. What may interact with this medicine? This medicine may interact with the following medications: -amiodarone -amphotericin B -azathioprine -certain antiviral medicines for HIV or AIDS such as protease inhibitors (e.g., indinavir, ritonavir) and zidovudine -certain blood pressure medications such as benazepril, captopril, enalapril, fosinopril, lisinopril, moexipril, monopril, perindopril, quinapril, ramipril, trandolapril -certain cancer medications such as anthracyclines (e.g., daunorubicin, doxorubicin), busulfan, cytarabine, paclitaxel, pentostatin, tamoxifen, trastuzumab -certain diuretics such as chlorothiazide, chlorthalidone, hydrochlorothiazide, indapamide, metolazone -certain medicines that treat or prevent blood clots like warfarin -certain muscle relaxants such as succinylcholine -cyclosporine -etanercept -indomethacin -medicines to increase blood counts like filgrastim, pegfilgrastim, sargramostim -medicines used as general anesthesia -metronidazole -natalizumab This list may not describe all possible interactions. Give your health care provider a list of all the medicines, herbs, non-prescription drugs, or dietary supplements you use. Also tell them if you smoke, drink alcohol, or use illegal drugs. Some items may interact with your medicine. What should I watch for while using this medicine? Visit your doctor for checks on your progress. This drug may make you feel generally unwell. This is not uncommon, as chemotherapy can affect healthy cells as well as cancer cells. Report any side effects. Continue your course of treatment even though you feel ill unless your doctor tells you to stop. Drink water or other fluids as directed. Urinate often, even at night. In some cases, you may be given additional medicines to help   with side effects. Follow all directions for  their use. Call your doctor or health care professional for advice if you get a fever, chills or sore throat, or other symptoms of a cold or flu. Do not treat yourself. This drug decreases your body's ability to fight infections. Try to avoid being around people who are sick. This medicine may increase your risk to bruise or bleed. Call your doctor or health care professional if you notice any unusual bleeding. Be careful brushing and flossing your teeth or using a toothpick because you may get an infection or bleed more easily. If you have any dental work done, tell your dentist you are receiving this medicine. You may get drowsy or dizzy. Do not drive, use machinery, or do anything that needs mental alertness until you know how this medicine affects you. Do not become pregnant while taking this medicine or for 1 year after stopping it. Women should inform their doctor if they wish to become pregnant or think they might be pregnant. Men should not father a child while taking this medicine and for 4 months after stopping it. There is a potential for serious side effects to an unborn child. Talk to your health care professional or pharmacist for more information. Do not breast-feed an infant while taking this medicine. This medicine may interfere with the ability to have a child. This medicine has caused ovarian failure in some women. This medicine has caused reduced sperm counts in some men. You should talk with your doctor or health care professional if you are concerned about your fertility. If you are going to have surgery, tell your doctor or health care professional that you have taken this medicine. What side effects may I notice from receiving this medicine? Side effects that you should report to your doctor or health care professional as soon as possible: -allergic reactions like skin rash, itching or hives, swelling of the face, lips, or tongue -low blood counts - this medicine may decrease the  number of white blood cells, red blood cells and platelets. You may be at increased risk for infections and bleeding. -signs of infection - fever or chills, cough, sore throat, pain or difficulty passing urine -signs of decreased platelets or bleeding - bruising, pinpoint red spots on the skin, black, tarry stools, blood in the urine -signs of decreased red blood cells - unusually weak or tired, fainting spells, lightheadedness -breathing problems -dark urine -dizziness -palpitations -swelling of the ankles, feet, hands -trouble passing urine or change in the amount of urine -weight gain -yellowing of the eyes or skin Side effects that usually do not require medical attention (report to your doctor or health care professional if they continue or are bothersome): -changes in nail or skin color -hair loss -missed menstrual periods -mouth sores -nausea, vomiting This list may not describe all possible side effects. Call your doctor for medical advice about side effects. You may report side effects to FDA at 1-800-FDA-1088. Where should I keep my medicine? This drug is given in a hospital or clinic and will not be stored at home. NOTE: This sheet is a summary. It may not cover all possible information. If you have questions about this medicine, talk to your doctor, pharmacist, or health care provider.    2016, Elsevier/Gold Standard. (2012-10-03 16:22:58)

## 2016-04-06 ENCOUNTER — Telehealth: Payer: Self-pay | Admitting: *Deleted

## 2016-04-06 NOTE — Telephone Encounter (Signed)
1st AC yesterday. Patient is eating and drinking well, denies diarrhea. Patient advised to call with questions or concerns. She verbalized understanding.

## 2016-04-07 ENCOUNTER — Emergency Department (HOSPITAL_COMMUNITY)
Admission: EM | Admit: 2016-04-07 | Discharge: 2016-04-07 | Disposition: A | Discharge status: Home / Self Care | Payer: 59 | Attending: Emergency Medicine | Admitting: Emergency Medicine

## 2016-04-07 ENCOUNTER — Encounter (HOSPITAL_COMMUNITY): Payer: Self-pay | Admitting: Emergency Medicine

## 2016-04-07 DIAGNOSIS — G2581 Restless legs syndrome: Secondary | ICD-10-CM | POA: Diagnosis not present

## 2016-04-07 DIAGNOSIS — Z79899 Other long term (current) drug therapy: Secondary | ICD-10-CM | POA: Insufficient documentation

## 2016-04-07 DIAGNOSIS — R110 Nausea: Secondary | ICD-10-CM | POA: Insufficient documentation

## 2016-04-07 DIAGNOSIS — F329 Major depressive disorder, single episode, unspecified: Secondary | ICD-10-CM | POA: Diagnosis not present

## 2016-04-07 DIAGNOSIS — Z8739 Personal history of other diseases of the musculoskeletal system and connective tissue: Secondary | ICD-10-CM | POA: Diagnosis not present

## 2016-04-07 DIAGNOSIS — T451X5A Adverse effect of antineoplastic and immunosuppressive drugs, initial encounter: Secondary | ICD-10-CM | POA: Insufficient documentation

## 2016-04-07 DIAGNOSIS — C50412 Malignant neoplasm of upper-outer quadrant of left female breast: Secondary | ICD-10-CM | POA: Diagnosis not present

## 2016-04-07 DIAGNOSIS — L7622 Postprocedural hemorrhage and hematoma of skin and subcutaneous tissue following other procedure: Secondary | ICD-10-CM | POA: Insufficient documentation

## 2016-04-07 DIAGNOSIS — F419 Anxiety disorder, unspecified: Secondary | ICD-10-CM | POA: Diagnosis not present

## 2016-04-07 DIAGNOSIS — Z8719 Personal history of other diseases of the digestive system: Secondary | ICD-10-CM | POA: Insufficient documentation

## 2016-04-07 LAB — BASIC METABOLIC PANEL
Anion gap: 8 (ref 5–15)
BUN: 25 mg/dL — ABNORMAL HIGH (ref 6–20)
CO2: 25 mmol/L (ref 22–32)
Calcium: 9.5 mg/dL (ref 8.9–10.3)
Chloride: 108 mmol/L (ref 101–111)
Creatinine, Ser: 1.02 mg/dL — ABNORMAL HIGH (ref 0.44–1.00)
GFR calc Af Amer: >60 mL/min (ref >60)
GFR calc non Af Amer: 59 mL/min — ABNORMAL LOW (ref >60)
Glucose, Bld: 121 mg/dL — ABNORMAL HIGH (ref 65–99)
Potassium: 4.9 mmol/L (ref 3.5–5.1)
Sodium: 141 mmol/L (ref 135–145)

## 2016-04-07 LAB — CBC WITH DIFFERENTIAL/PLATELET
Basophils Absolute: 0 10*3/uL (ref 0.0–0.1)
Basophils Relative: 0 %
Eosinophils Absolute: 0 10*3/uL (ref 0.0–0.7)
Eosinophils Relative: 0 %
HCT: 37.4 % (ref 36.0–46.0)
Hemoglobin: 12.2 g/dL (ref 12.0–15.0)
Lymphocytes Relative: 2 %
Lymphs Abs: 0.7 10*3/uL (ref 0.7–4.0)
MCH: 29.1 pg (ref 26.0–34.0)
MCHC: 32.6 g/dL (ref 30.0–36.0)
MCV: 89.3 fL (ref 78.0–100.0)
Monocytes Absolute: 2 10*3/uL — ABNORMAL HIGH (ref 0.1–1.0)
Monocytes Relative: 6 %
Neutro Abs: 31 10*3/uL — ABNORMAL HIGH (ref 1.7–7.7)
Neutrophils Relative %: 92 %
Platelets: 259 10*3/uL (ref 150–400)
RBC: 4.19 MIL/uL (ref 3.87–5.11)
RDW: 14.1 % (ref 11.5–15.5)
WBC Morphology: INCREASED
WBC: 33.7 10*3/uL — ABNORMAL HIGH (ref 4.0–10.5)

## 2016-04-07 MED ORDER — ONDANSETRON HCL 4 MG/2ML IJ SOLN
4.0000 mg | Freq: Once | INTRAMUSCULAR | Status: AC | PRN
  Administered 2016-04-07: 4 mg via INTRAVENOUS
  Filled 2016-04-07: qty 2

## 2016-04-07 MED ORDER — SODIUM CHLORIDE 0.9 % IV BOLUS (SEPSIS)
1000.0000 mL | Freq: Once | INTRAVENOUS | Status: AC
  Administered 2016-04-07: 1000 mL via INTRAVENOUS

## 2016-04-07 NOTE — ED Notes (Signed)
Patient states she got her first dose of chemo on 04/05/2016, and first dose of dexamethasone on 04/06/2016 which triggered her RLS. Patient states she has a hx of Vitamin D deficiency. Patient states she became concerned when it wouldn't stop. Patient took multiple medications throughout the day without relief of RLS. Patient took her home dilaudid at 70 which has currently relieved her RLS.

## 2016-04-07 NOTE — ED Provider Notes (Signed)
CSN: [id number]     Arrival date & time 04/07/16  0131 History   By signing my name below, I, Emmanuella Mensah, attest that this documentation has been prepared under the direction and in the presence of Dorie Rank, MD. Electronically Signed: Judithann Sauger, ED Scribe. 04/07/2016. 3:36 AM.    Chief Complaint  Patient presents with  . RLS     cancer patient, started chemo on 04/05/2016   The history is provided by the patient. No language interpreter was used.   HPI Comments: Alexis Fox is a 60 y.o. female Restless leg syndrome who presents to the Emergency Department requesting evaluation for her restless syndrome flare up. She reports associated nausea and not being able to eat/drink appropriately. Pt explains that she was recently diagnosed with breast cancer therefore she had a port placed 5 days ago and her first chemo was 2 days ago where she received dexamethasone which she notes triggered her RLS. No alleviating factors noted. Pt reports that she took dilaudid PTA. She denies any fever, chills, vomiting, or diarrhea.    Past Medical History  Diagnosis Date  . Arthritis     knees  . Depression   . Restless leg syndrome   . Cholecystitis   . Anxiety   . Hot flashes   . Breast cancer of upper-outer quadrant of left female breast ([facility]) 03/20/2016   Past Surgical History  Procedure Laterality Date  . Elbow surgery      right for epicondylitis 2010   . Bunionectomy    . Foot surgery      1983 -tarsal tunnel release  . Lumbar disc surgery  04/04/2012  . Tarsal tunnel release    . Spinal fusion  5/12    L5-S1  . Cholecystectomy N/A 10/04/2015    Procedure: LAPAROSCOPIC CHOLECYSTECTOMY WITH INTRAOPERATIVE CHOLANGIOGRAM;  Surgeon: Greer Pickerel, MD;  Location: [facility];  Service: General;  Laterality: N/A;  . Portacath placement Right 04/02/2016    Procedure: INSERTION PORT-A-CATH WITH [place];  Surgeon: Rolm Bookbinder, MD;  Location: [facility];  Service: General;   Laterality: Right;   Family History  Problem Relation Age of Onset  . Anesthesia problems Neg Hx   . Heart disease Mother   . Pulmonary fibrosis Mother   . Hypertension Mother   . Cancer Father     astocytoma   Social History  Substance Use Topics  . Smoking status: Never Smoker   . Smokeless tobacco: Never Used  . Alcohol Use: No   OB History    Gravida Para Term Preterm AB TAB SAB Ectopic Multiple Living   3 2        2      Review of Systems  Constitutional: Negative for fever and chills.  Gastrointestinal: Positive for nausea. Negative for vomiting and diarrhea.  Musculoskeletal:       Restless leg syndrome      Allergies  Review of patient's allergies indicates no known allergies.  Home Medications   Prior to Admission medications   Medication Sig Start Date End Date Taking? Authorizing Provider  Atorvastatin Calcium (INVESTIGATIONAL ATORVASTATIN/PLACEBO) 40 MG tablet [facility] [id number] Take 1 tablet by mouth daily. Take 2 doses (these doses must be 12 hours apart) prior to first chemotherapy treatment. Then take 1 tablet daily by mouth with or without food. 04/04/16  Yes Nicholas Lose, MD  citalopram (CELEXA) 40 MG tablet  12/21/15  Yes Historical Provider, MD  clonazePAM (KLONOPIN) 1 MG tablet Take  0.5 mg by mouth at bedtime.    Yes Historical Provider, MD  dexamethasone (DECADRON) 4 MG tablet Take 1 tablet (4 mg total) by mouth 2 (two) times daily. 1 tab two times daily for 2 days. Take with food. 03/28/16  Yes Nicholas Lose, MD  lidocaine-prilocaine (EMLA) cream Apply to affected area once 03/28/16  Yes Nicholas Lose, MD  loratadine (CLARITIN) 10 MG tablet Take 10 mg by mouth daily.   Yes Historical Provider, MD  nystatin-triamcinolone ointment (MYCOLOG) Apply 1 application topically 2 (two) times daily. Patient taking differently: Apply 1 application topically 2 (two) times daily as needed (for itching skin.).  03/09/16  Yes Huel Cote, NP  ondansetron (ZOFRAN) 8 MG  tablet Take 1 tablet (8 mg total) by mouth 2 (two) times daily as needed. Start on the third day after chemotherapy. 03/28/16  Yes Nicholas Lose, MD  pramipexole (MIRAPEX) 0.5 MG tablet Take 0.5 mg by mouth at bedtime.   Yes Historical Provider, MD  prochlorperazine (COMPAZINE) 10 MG tablet Take 1 tablet (10 mg total) by mouth every 6 (six) hours as needed (Nausea or vomiting). 03/28/16  Yes Nicholas Lose, MD  traMADol (ULTRAM) 50 MG tablet Take 1 tablet (50 mg total) by mouth every 6 (six) hours as needed. Patient taking differently: Take 50 mg by mouth every 6 (six) hours as needed for moderate pain.  04/02/16  Yes Rolm Bookbinder, MD  zolpidem (AMBIEN) 5 MG tablet Take 5 mg by mouth at bedtime.   Yes Historical Provider, MD  buPROPion (WELLBUTRIN XL) 150 MG 24 hr tablet Take 150 mg by mouth daily. 03/06/16   Historical Provider, MD  LORazepam (ATIVAN) 0.5 MG tablet Take 0.5 mg by mouth daily as needed. For anxiety. 03/28/16   Historical Provider, MD  RESTASIS 0.05 % ophthalmic emulsion Place 1 drop into both eyes 2 (two) times daily as needed. For dry eyes. 03/07/16   Historical Provider, MD   BP 114/61 mmHg  Pulse 61  Temp(Src) 97.7 F (36.5 C) (Oral)  Resp 20  Ht 5\' 4"  (1.626 m)  Wt 91.627 kg  BMI 34.66 kg/m2  SpO2 97% Physical Exam  Constitutional: No distress.  HENT:  Head: Normocephalic and atraumatic.  Right Ear: External ear normal.  Left Ear: External ear normal.  Eyes: Conjunctivae are normal. Right eye exhibits no discharge. Left eye exhibits no discharge. No scleral icterus.  Neck: Neck supple. No tracheal deviation present.  Cardiovascular: Normal rate, regular rhythm and intact distal pulses.   Pulmonary/Chest: Effort normal and breath sounds normal. No stridor. No respiratory distress. She has no wheezes. She has no rales.  Contusion right anterior chest wall associated with port-cath  Abdominal: Soft. Bowel sounds are normal. She exhibits no distension. There is no tenderness.  There is no rebound and no guarding.  Musculoskeletal: She exhibits no edema or tenderness.  Neurological: She is alert. She has normal strength. No cranial nerve deficit (no facial droop, extraocular movements intact, no slurred speech) or sensory deficit. She exhibits normal muscle tone. She displays no seizure activity. Coordination normal.  Skin: Skin is warm and dry. No rash noted.  Psychiatric: She has a normal mood and affect.  Nursing note and vitals reviewed.   ED Course  Procedures (including critical care time) DIAGNOSTIC STUDIES: Oxygen Saturation is 97% on RA, normal by my interpretation.    COORDINATION OF CARE: 3:35 AM- Pt advised of plan for treatment and pt agrees.   Labs Review Labs Reviewed  CBC WITH  DIFFERENTIAL/PLATELET - Abnormal; Notable for the following:    WBC 33.7 (*)    Neutro Abs 31.0 (*)    Monocytes Absolute 2.0 (*)    All other components within normal limits  BASIC METABOLIC PANEL - Abnormal; Notable for the following:    Glucose, Bld 121 (*)    BUN 25 (*)    Creatinine, Ser 1.02 (*)    GFR calc non Af Amer 59 (*)    All other components within normal limits      I have personally reviewed and evaluated these  lab results as part of my medical decision-making.    MDM   Final diagnoses:  Chemotherapy-induced nausea    Pt was given IV fluids and zofran.  Her restless leg complaints had resolved prior to my exam.  No vomiting in the ED.  Sx have improved.  She is resting comfortably.  Stable for discharge.  I personally performed the services described in this documentation, which was scribed in my presence.  The recorded information has been reviewed and is accurate.   Dorie Rank, MD 04/07/16 [phone]

## 2016-04-07 NOTE — ED Notes (Signed)
Warm blankets and pillows given for comfort.

## 2016-04-07 NOTE — ED Notes (Signed)
Pt resting quietly with family at bedside.  NAD.  Fluids infusing.

## 2016-04-07 NOTE — ED Notes (Signed)
Pt reports new dx of Lt side breast CA,  Pt sts first chemo 04/05/2016 and was given dexamethasone after chemo and she states she her legs have been wild all day.  Pt report taking another dexamethasone yesterday morning.

## 2016-04-08 MED FILL — ZOLPIDEM TARTRATE 5 MG TAB: 5 | 30 days supply | Qty: 30 | Fill #1

## 2016-04-09 ENCOUNTER — Ambulatory Visit (HOSPITAL_COMMUNITY)
Admission: RE | Admit: 2016-04-09 | Discharge: 2016-04-09 | Disposition: A | Discharge status: Home / Self Care | Payer: 59 | Source: Ambulatory Visit | Attending: Internal Medicine | Admitting: Internal Medicine

## 2016-04-09 ENCOUNTER — Encounter (HOSPITAL_COMMUNITY): Payer: Self-pay | Admitting: Internal Medicine

## 2016-04-09 VITALS — BP 134/78 | HR 63 | Wt 204.8 lb

## 2016-04-09 DIAGNOSIS — F419 Anxiety disorder, unspecified: Secondary | ICD-10-CM | POA: Insufficient documentation

## 2016-04-09 DIAGNOSIS — Z8249 Family history of ischemic heart disease and other diseases of the circulatory system: Secondary | ICD-10-CM | POA: Diagnosis not present

## 2016-04-09 DIAGNOSIS — F329 Major depressive disorder, single episode, unspecified: Secondary | ICD-10-CM | POA: Diagnosis not present

## 2016-04-09 DIAGNOSIS — G2581 Restless legs syndrome: Secondary | ICD-10-CM | POA: Diagnosis not present

## 2016-04-09 DIAGNOSIS — C50412 Malignant neoplasm of upper-outer quadrant of left female breast: Secondary | ICD-10-CM

## 2016-04-09 DIAGNOSIS — Z79899 Other long term (current) drug therapy: Secondary | ICD-10-CM | POA: Insufficient documentation

## 2016-04-09 NOTE — Patient Instructions (Addendum)
Your physician recommends that you schedule a follow-up appointment in: 1 month with echocardiogram 

## 2016-04-09 NOTE — Progress Notes (Signed)
Patient ID: Alexis Fox, female   DOB: 02-01-1956, 60 y.o.   MRN: [id number]  CARDIO-ONCOLOGY CONSULT NOTE   Patient Care Team: Alexis Orn, MD as PCP - General (Internal Medicine)  Oncologist/Referring: Dr. 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 Lindi Fox for enrollment into the Cardio-Oncology Surveillance program.    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 Has just received first cycle of chemotherapy. Profound fatigue. Prior to this was very active without exertional SOB or CP. Denies significant, edema, orthopnea or PND. Mild snoring.    Echo 5/17: EF 60-65% lateral s' 9.5 cm/sec GLS -25.5%  REVIEW OF SYSTEMS:   Constitutional: Denies fevers, chills or abnormal weight loss ++fatigue Eyes: Denies blurriness of vision Ears, nose, mouth, throat, and face: Denies mucositis or sore throat Respiratory: Denies cough, dyspnea or wheezes Cardiovascular: Denies palpitation, chest discomfort Gastrointestinal:  Denies nausea, heartburn or change in bowel habits Skin: Denies abnormal skin rashes Lymphatics: Denies new lymphadenopathy or easy bruising Neurological:Denies numbness, tingling or new weaknesses Behavioral/Psych: Mood is stable, no new changes    Extremities: No lower extremity edema  All other systems were reviewed with the patient and are negative.   Past Medical History  Diagnosis Date  . Arthritis     knees  . Depression   . Restless leg syndrome   . Cholecystitis   . Anxiety   . Hot flashes   . Breast cancer of upper-outer quadrant of left female breast ([place]) 03/20/2016   Social History   Social History  . Marital Status: Married    Spouse Name: N/A  . Number of Children: N/A  . Years of Education: N/A   Occupational History  . Not on file.   Social History Main Topics  . Smoking status: Never Smoker   . Smokeless tobacco: Never Used  . Alcohol Use: No  . Drug Use: No  . Sexual Activity: Yes    Birth Control/ Protection: Post-menopausal   Other Topics Concern  . Not on file   Social History Narrative   Family History  Problem Relation Age of Onset  . Anesthesia problems Neg Hx   . Heart disease Mother   . Pulmonary fibrosis Mother   . Hypertension Mother   . Cancer Father     astocytoma     ALLERGIES:  has No Known Allergies.  MEDICATIONS:  Current Outpatient Prescriptions  Medication Sig Dispense Refill  . Atorvastatin Calcium (INVESTIGATIONAL ATORVASTATIN/PLACEBO) 40 MG tablet [facility] [id number] Take 1 tablet by mouth daily. Take 2 doses (these doses must be 12 hours apart) prior to first chemotherapy treatment. Then take 1 tablet daily by mouth with or without food. 180 tablet 0  . buPROPion (WELLBUTRIN XL) 150 MG 24 hr  tablet Take 150 mg by mouth daily.  4  . citalopram (CELEXA) 40 MG tablet   3  . clonazePAM (KLONOPIN) 1 MG tablet Take 0.5 mg by mouth at bedtime.     Marland Kitchen dexamethasone (DECADRON) 4 MG tablet Take 1 tablet (4 mg total) by mouth 2 (two) times daily. 1 tab two times daily for 2 days. Take with food. 30 tablet 1  . loratadine (CLARITIN) 10 MG tablet Take 10 mg by mouth daily.    Marland Kitchen LORazepam (ATIVAN) 0.5 MG tablet Take 0.5 mg by mouth daily as needed. For anxiety.  0  .  nystatin-triamcinolone ointment (MYCOLOG) Apply 1 application topically 2 (two) times daily. (Patient taking differently: Apply 1 application topically 2 (two) times daily as needed (for itching skin.). ) 60 g 3  . ondansetron (ZOFRAN) 8 MG tablet Take 1 tablet (8 mg total) by mouth 2 (two) times daily as needed. Start on the third day after chemotherapy. 30 tablet 1  . pramipexole (MIRAPEX) 0.5 MG tablet Take 0.5 mg by mouth at bedtime.    . prochlorperazine (COMPAZINE) 10 MG tablet Take 1 tablet (10 mg total) by mouth every 6 (six) hours as needed (Nausea or vomiting). 30 tablet 1  . RESTASIS 0.05 % ophthalmic emulsion Place 1 drop into both eyes 2 (two) times daily as needed. For dry eyes.  4  . traMADol (ULTRAM) 50 MG tablet Take 1 tablet (50 mg total) by mouth every 6 (six) hours as needed. (Patient taking differently: Take 50 mg by mouth every 6 (six) hours as needed for moderate pain. ) 20 tablet 0  . zolpidem (AMBIEN) 5 MG tablet Take 5 mg by mouth at bedtime.    . lidocaine-prilocaine (EMLA) cream Apply to affected area once 30 g 3   No current facility-administered medications for this encounter.    PHYSICAL EXAMINATION:  Filed Vitals:   04/09/16 1515  BP: 134/78  Pulse: 63   Filed Weights   04/09/16 1515  Weight: 204 lb 12 oz (92.874 kg)    General:  Fatigued appearing. No resp difficulty HEENT: normal Neck: supple. no JVD.RIJ port  Carotids 2+ bilat; no bruits. No lymphadenopathy or thryomegaly appreciated. Cor: PMI nondisplaced. Regular rate & rhythm. No rubs, gallops or murmurs. Lungs: clear Abdomen: obese soft, nontender, nondistended. No hepatosplenomegaly. No bruits or masses. Good bowel sounds. Extremities: no cyanosis, clubbing, rash, edema Neuro: alert & orientedx3, cranial nerves grossly intact. moves all 4 extremities w/o difficulty. Affect pleasant   LABORATORY DATA:  I have reviewed the data as listed   Chemistry      Component Value Date/Time   NA 141  04/07/2016 0345   NA 141 03/28/2016 0821   K 4.9 04/07/2016 0345   K 4.3 03/28/2016 0821   CL 108 04/07/2016 0345   CO2 25 04/07/2016 0345   CO2 28 03/28/2016 0821   BUN 25* 04/07/2016 0345   BUN 13.2 03/28/2016 0821   CREATININE 1.02* 04/07/2016 0345   CREATININE 1.0 03/28/2016 0821      Component Value Date/Time   CALCIUM 9.5 04/07/2016 0345   CALCIUM 9.5 03/28/2016 0821   ALKPHOS 62 03/28/2016 0821   ALKPHOS 92 10/06/2015 0616   AST 14 03/28/2016 0821   AST 61* 10/06/2015 0616   ALT 12 03/28/2016 0821   ALT 188* 10/06/2015 0616   BILITOT 0.47 03/28/2016 0821   BILITOT 0.5 10/06/2015 0616       Lab Results  Component Value Date   WBC  33.7* 04/07/2016   HGB 12.2 04/07/2016   HCT 37.4 04/07/2016   MCV 89.3 04/07/2016   PLT 259 04/07/2016   NEUTROABS 31.0* 04/07/2016   ASSESSMENT & PLAN:  1. Left Breast Cancer  - T2 N1/N2 stage IIb vs IIIa, ER 0%, PR 0%, HER-2 negative ratio 1.68, Ki-67 90%    --now s/p cycle 1/4 of dose dense Adriamycin and Cytoxan followed by Abraxane 12; PREVENT trial (atorvastatin versus placebo)    --Stable from cardiac perspective. I reviewed echos personally. EF and Doppler parameters stable. No HF on exam.      --Discussed incidence of adriamycin-induced cardiotoxicity. We will repeat echo after 3/4 round of chemo. She is currently participating in PREVENT trial with atorvastatin.     Glori Bickers, MD 04/05/2016

## 2016-04-10 MED FILL — PRAMIPEXOLE 0.5 MG TABLET: 0.5 | 90 days supply | Qty: 90 | Fill #0

## 2016-04-12 ENCOUNTER — Other Ambulatory Visit (HOSPITAL_BASED_OUTPATIENT_CLINIC_OR_DEPARTMENT_OTHER): Payer: 59

## 2016-04-12 ENCOUNTER — Encounter: Payer: Self-pay | Admitting: *Deleted

## 2016-04-12 ENCOUNTER — Ambulatory Visit (HOSPITAL_BASED_OUTPATIENT_CLINIC_OR_DEPARTMENT_OTHER): Payer: 59 | Admitting: Hematology and Oncology

## 2016-04-12 ENCOUNTER — Encounter: Payer: Self-pay | Admitting: Hematology and Oncology

## 2016-04-12 VITALS — BP 100/63 | HR 82 | Temp 97.8°F | Resp 18 | Ht 64.0 in | Wt 198.5 lb

## 2016-04-12 DIAGNOSIS — C50412 Malignant neoplasm of upper-outer quadrant of left female breast: Secondary | ICD-10-CM | POA: Diagnosis not present

## 2016-04-12 DIAGNOSIS — R110 Nausea: Secondary | ICD-10-CM

## 2016-04-12 DIAGNOSIS — Z171 Estrogen receptor negative status [ER-]: Secondary | ICD-10-CM

## 2016-04-12 DIAGNOSIS — D701 Agranulocytosis secondary to cancer chemotherapy: Secondary | ICD-10-CM

## 2016-04-12 LAB — COMPREHENSIVE METABOLIC PANEL
ALT: 15 U/L (ref 0–55)
AST: 10 U/L (ref 5–34)
Albumin: 3.6 g/dL (ref 3.5–5.0)
Alkaline Phosphatase: 81 U/L (ref 40–150)
Anion Gap: 8 mEq/L (ref 3–11)
BUN: 13.9 mg/dL (ref 7.0–26.0)
CO2: 26 mEq/L (ref 22–29)
Calcium: 9.5 mg/dL (ref 8.4–10.4)
Chloride: 104 mEq/L (ref 98–109)
Creatinine: 0.9 mg/dL (ref 0.6–1.1)
EGFR: 73 mL/min/{1.73_m2} — ABNORMAL LOW (ref >90)
Glucose: 109 mg/dl (ref 70–140)
Potassium: 4.8 mEq/L (ref 3.5–5.1)
Sodium: 139 mEq/L (ref 136–145)
Total Bilirubin: 0.53 mg/dL (ref 0.20–1.20)
Total Protein: 7.2 g/dL (ref 6.4–8.3)

## 2016-04-12 LAB — CBC WITH DIFFERENTIAL/PLATELET
BASO%: 0.4 % (ref 0.0–2.0)
Basophils Absolute: 0 10*3/uL (ref 0.0–0.1)
EOS%: 15.8 % — ABNORMAL HIGH (ref 0.0–7.0)
Eosinophils Absolute: 0.2 10*3/uL (ref 0.0–0.5)
HCT: 41.1 % (ref 34.8–46.6)
HGB: 13.1 g/dL (ref 11.6–15.9)
LYMPH%: 60.2 % — ABNORMAL HIGH (ref 14.0–49.7)
MCH: 28.4 pg (ref 25.1–34.0)
MCHC: 31.9 g/dL (ref 31.5–36.0)
MCV: 89 fL (ref 79.5–101.0)
MONO#: 0.1 10*3/uL (ref 0.1–0.9)
MONO%: 13.9 % (ref 0.0–14.0)
NEUT#: 0.1 10*3/uL — CL (ref 1.5–6.5)
NEUT%: 9.7 % — ABNORMAL LOW (ref 38.4–76.8)
Platelets: 128 10*3/uL — ABNORMAL LOW (ref 145–400)
RBC: 4.62 10*6/uL (ref 3.70–5.45)
RDW: 13.7 % (ref 11.2–14.5)
WBC: 1.1 10*3/uL — ABNORMAL LOW (ref 3.9–10.3)
lymph#: 0.6 10*3/uL — ABNORMAL LOW (ref 0.9–3.3)

## 2016-04-12 NOTE — Assessment & Plan Note (Signed)
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 Neoadjuvant chemotherapy with Adriamycin and Cytoxan dose dense 4 followed by Abraxane weekly 12 2. Followed by breast conserving surgery with sentinel lymph node study vs targeted axillary dissection 3. Followed by adjuvant radiation therapy ------------------------------------------------------------------------------------------------------------------------------------PREVENT trial: [id number]: Patient is enrolled in this trial atorvastatin versus placebo  Current treatment: Today cycle 1 day 8 dose dense Adriamycin and Cytoxan  Chemotherapy toxicities: 1. Emergency room visit for severe restless leg syndrome resolved after she received Dilaudid. It is felt to be related to dexamethasone. I will remove dexamethasone from subsequent treatments. I also asked patient not to take oral dexamethasone.  Monitoring closely for toxicities Return to clinic in 1 week for cycle 2

## 2016-04-12 NOTE — Progress Notes (Signed)
Patient Care Team: [name], MD as PCP - General (Internal Medicine)  DIAGNOSIS: Breast cancer of upper-outer quadrant of left female breast [facility])   Staging form: Breast, AJCC 7th Edition     Clinical stage from 03/28/2016: Stage IIB (T2, N1, M0) - Unsigned  SUMMARY OF ONCOLOGIC HISTORY:   Breast cancer of upper-outer quadrant of left female breast ([place])   03/02/2016 Mammogram Hypoechoic masses in the upper outer quadrant left breast 2 adjacent irregular masses 2.5-1.4 x 1.2 cm and the other 2.7 x 1.4 x 2.3 cm total span 6.4 cm, enlarged multiple axillary lymph nodes measuring 1.7 cm, T2 N1/N2 stage IIb vs IIIa   03/19/2016 Initial Diagnosis Left breast biopsy 12:30 position: Invasive ductal carcinoma with DCIS, 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 Today cycle 1 day 8. For the past week she has not been feeling well. After the chemotherapy showed profound restless legs to the point that she had to go to the emergency department. Narcotic pain medication appears to have used it. He did seems to be attributable to dexamethasone prescription. She has not had any further restless legs since dexamethasone was discontinued. She is also felt profoundly weak and fatigued. She has had intermittent nausea for which she had taken Compazine. Did not have emesis.  REVIEW OF SYSTEMS:   Constitutional: Denies fevers, chills or abnormal weight loss Eyes: Denies blurriness of vision Ears, nose, mouth, throat, and face: Denies mucositis or sore  throat Respiratory: Denies cough, dyspnea or wheezes Cardiovascular: Denies palpitation, chest discomfort Gastrointestinal:  Denies nausea, heartburn or change in bowel habits Skin: Denies abnormal skin rashes Lymphatics: Denies new lymphadenopathy or easy bruising Neurological:Denies numbness, tingling. Complains of generalized weakness Behavioral/Psych: Tearful and appears to be mildly depressed Extremities: No lower extremity edema  All other systems were reviewed with the patient and are negative.  I have reviewed the past medical history, past surgical history, social history and family history with the patient and they are unchanged from previous note.  ALLERGIES:  has No Known Allergies.  MEDICATIONS:  Current Outpatient Prescriptions  Medication Sig Dispense Refill  . Atorvastatin Calcium (INVESTIGATIONAL ATORVASTATIN/PLACEBO) 40 MG tablet [facility] [id number] Take 1 tablet by mouth daily. Take 2 doses (these doses must be 12 hours apart) prior to first chemotherapy treatment. Then take 1 tablet daily by mouth with or without food. 180 tablet 0  . buPROPion (WELLBUTRIN XL) 150 MG 24 hr tablet Take 150 mg by mouth daily.  4  . citalopram (CELEXA) 40 MG tablet   3  . clonazePAM (KLONOPIN) 1 MG tablet Take 0.5 mg by mouth at bedtime.     Marland Kitchen dexamethasone (DECADRON) 4 MG tablet Take 1 tablet (4 mg total) by mouth 2 (two) times daily. 1 tab two times daily for 2 days. Take with food. 30 tablet 1  . lidocaine-prilocaine (EMLA) cream Apply to affected area once 30 g 3  . loratadine (CLARITIN) 10 MG tablet Take 10 mg by mouth daily.    Marland Kitchen LORazepam (ATIVAN) 0.5 MG tablet Take 0.5 mg by mouth daily as  needed. For anxiety.  0  . nystatin-triamcinolone ointment (MYCOLOG) Apply 1 application topically 2 (two) times daily. (Patient taking differently: Apply 1 application topically 2 (two) times daily as needed (for itching skin.). ) 60 g 3  . ondansetron (ZOFRAN) 8 MG tablet Take 1 tablet (8 mg  total) by mouth 2 (two) times daily as needed. Start on the third day after chemotherapy. 30 tablet 1  . pramipexole (MIRAPEX) 0.5 MG tablet Take 0.5 mg by mouth at bedtime.    . prochlorperazine (COMPAZINE) 10 MG tablet Take 1 tablet (10 mg total) by mouth every 6 (six) hours as needed (Nausea or vomiting). 30 tablet 1  . RESTASIS 0.05 % ophthalmic emulsion Place 1 drop into both eyes 2 (two) times daily as needed. For dry eyes.  4  . traMADol (ULTRAM) 50 MG tablet Take 1 tablet (50 mg total) by mouth every 6 (six) hours as needed. (Patient taking differently: Take 50 mg by mouth every 6 (six) hours as needed for moderate pain. ) 20 tablet 0  . zolpidem (AMBIEN) 5 MG tablet Take 5 mg by mouth at bedtime.     No current facility-administered medications for this visit.    PHYSICAL EXAMINATION: ECOG PERFORMANCE STATUS: 2 - Symptomatic, <50% confined to bed  Filed Vitals:   04/12/16 0956  BP: 100/63  Pulse: 82  Temp: 97.8 F (36.6 C)  Resp: 18   Filed Weights   04/12/16 0956  Weight: 198 lb 8 oz (90.039 kg)    GENERAL:alert, no distress and comfortable, Came in a wheelchair because of fatigue and weakness SKIN: skin color, texture, turgor are normal, no rashes or significant lesions EYES: normal, Conjunctiva are pink and non-injected, sclera clear OROPHARYNX:no exudate, no erythema and lips, buccal mucosa, and tongue normal  NECK: supple, thyroid normal size, non-tender, without nodularity LYMPH:  no palpable lymphadenopathy in the cervical, axillary or inguinal LUNGS: clear to auscultation and percussion with normal breathing effort HEART: regular rate & rhythm and no murmurs and no lower extremity edema ABDOMEN:abdomen soft, non-tender and normal bowel sounds MUSCULOSKELETAL:no cyanosis of digits and no clubbing  NEURO: alert & oriented x 3 with fluent speech, no focal motor/sensory deficits EXTREMITIES: No lower extremity edema  LABORATORY DATA:  I have reviewed the data as  listed   Chemistry      Component Value Date/Time   NA 139 04/12/2016 0941   NA 141 04/07/2016 0345   K 4.8 04/12/2016 0941   K 4.9 04/07/2016 0345   CL 108 04/07/2016 0345   CO2 26 04/12/2016 0941   CO2 25 04/07/2016 0345   BUN 13.9 04/12/2016 0941   BUN 25* 04/07/2016 0345   CREATININE 0.9 04/12/2016 0941   CREATININE 1.02* 04/07/2016 0345      Component Value Date/Time   CALCIUM 9.5 04/12/2016 0941   CALCIUM 9.5 04/07/2016 0345   ALKPHOS 81 04/12/2016 0941   ALKPHOS 92 10/06/2015 0616   AST 10 04/12/2016 0941   AST 61* 10/06/2015 0616   ALT 15 04/12/2016 0941   ALT 188* 10/06/2015 0616   BILITOT 0.53 04/12/2016 0941   BILITOT 0.5 10/06/2015 0616      Lab Results  Component Value Date   WBC 1.1* 04/12/2016   HGB 13.1 04/12/2016   HCT 41.1 04/12/2016   MCV 89.0 04/12/2016   PLT 128* 04/12/2016   NEUTROABS 0.1* 04/12/2016   ASSESSMENT & PLAN:  Breast cancer of upper-outer quadrant of left female breast ([place]) Mammogram 03/02/2016: Hypoechoic masses  in the upper outer quadrant left breast 2 adjacent irregular masses 2.5-1.4 x 1.2 cm and the other 2.7 x 1.4 x 2.3 cm total span 6.4 cm, enlarged multiple axillary lymph nodes measuring 1.7 cm, T2 N1/N2 stage IIb vs IIIa Left breast biopsy 03/19/2016 12:30 position: Invasive ductal carcinoma with DCIS, 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 Neoadjuvant chemotherapy with Adriamycin and Cytoxan dose dense 4 followed by Abraxane weekly 12 2. Followed by breast conserving surgery with sentinel lymph node study vs targeted axillary dissection 3. Followed by adjuvant radiation therapy ------------------------------------------------------------------------------------------------------------------------------------ PREVENT trial: [id number]: Patient is enrolled in this trial atorvastatin versus placebo  Current treatment:  Today cycle 1 day 8 dose dense Adriamycin and Cytoxan  Chemotherapy toxicities: 1. Emergency room visit for severe restless leg syndrome resolved after she received Dilaudid. It is felt to be related to dexamethasone. I will remove dexamethasone from subsequent treatments. I also asked patient not to take oral dexamethasone. 2. Profound neutropenia: will decrease the dose of cycle 2 chemotherapy  3. Profound fatigue  4. Grade 1-2 nausea  5. Depressive symptoms I encouraged the patient to stay more active and gave her neutropenic instructions.  Monitoring closely for toxicities Return to clinic in 1 week for cycle 2  No orders of the defined types were placed in this encounter.   The patient has a good understanding of the overall plan. she agrees with it. she will call with any problems that may develop before the next visit here.   Rulon Eisenmenger, MD 04/12/2016

## 2016-04-12 NOTE — Progress Notes (Signed)
Critical ANC received from lab.  Dr. Lindi Adie notified.

## 2016-04-13 ENCOUNTER — Telehealth: Payer: Self-pay | Admitting: *Deleted

## 2016-04-13 NOTE — Telephone Encounter (Signed)
Received call from patient stating she his having some back pain and wasn't sure what she could take.  She has pain medication at home.  Informed her that is was ok to take her pain medication.  She has Tramadol, Vicodin, and Percocet.  Encouraged to take which ever one works the best for her and let us know how she is doing.  Patient verbalized understanding.

## 2016-04-19 ENCOUNTER — Ambulatory Visit (HOSPITAL_BASED_OUTPATIENT_CLINIC_OR_DEPARTMENT_OTHER): Payer: 59 | Admitting: Hematology and Oncology

## 2016-04-19 ENCOUNTER — Encounter: Payer: Self-pay | Admitting: *Deleted

## 2016-04-19 ENCOUNTER — Encounter: Payer: Self-pay | Admitting: Hematology and Oncology

## 2016-04-19 ENCOUNTER — Other Ambulatory Visit (HOSPITAL_BASED_OUTPATIENT_CLINIC_OR_DEPARTMENT_OTHER): Payer: 59

## 2016-04-19 ENCOUNTER — Encounter: Payer: Self-pay | Admitting: Genetic Counselor

## 2016-04-19 ENCOUNTER — Ambulatory Visit (HOSPITAL_BASED_OUTPATIENT_CLINIC_OR_DEPARTMENT_OTHER): Payer: 59

## 2016-04-19 VITALS — BP 137/55 | HR 66 | Temp 98.2°F | Resp 18 | Wt 201.5 lb

## 2016-04-19 DIAGNOSIS — C50412 Malignant neoplasm of upper-outer quadrant of left female breast: Secondary | ICD-10-CM

## 2016-04-19 DIAGNOSIS — Z5111 Encounter for antineoplastic chemotherapy: Secondary | ICD-10-CM

## 2016-04-19 DIAGNOSIS — Z006 Encounter for examination for normal comparison and control in clinical research program: Secondary | ICD-10-CM

## 2016-04-19 DIAGNOSIS — Z171 Estrogen receptor negative status [ER-]: Secondary | ICD-10-CM

## 2016-04-19 LAB — COMPREHENSIVE METABOLIC PANEL
ALT: 12 U/L (ref 0–55)
AST: 11 U/L (ref 5–34)
Albumin: 3.6 g/dL (ref 3.5–5.0)
Alkaline Phosphatase: 85 U/L (ref 40–150)
Anion Gap: 6 mEq/L (ref 3–11)
BUN: 11.6 mg/dL (ref 7.0–26.0)
CO2: 28 mEq/L (ref 22–29)
Calcium: 9.6 mg/dL (ref 8.4–10.4)
Chloride: 106 mEq/L (ref 98–109)
Creatinine: 1 mg/dL (ref 0.6–1.1)
EGFR: 62 mL/min/{1.73_m2} — ABNORMAL LOW (ref >90)
Glucose: 95 mg/dl (ref 70–140)
Potassium: 4.3 mEq/L (ref 3.5–5.1)
Sodium: 141 mEq/L (ref 136–145)
Total Bilirubin: <0.3 mg/dL (ref 0.20–1.20)
Total Protein: 7 g/dL (ref 6.4–8.3)

## 2016-04-19 LAB — CBC WITH DIFFERENTIAL/PLATELET
BASO%: 0.4 % (ref 0.0–2.0)
Basophils Absolute: 0 10*3/uL (ref 0.0–0.1)
EOS%: 0.7 % (ref 0.0–7.0)
Eosinophils Absolute: 0.1 10*3/uL (ref 0.0–0.5)
HCT: 36.5 % (ref 34.8–46.6)
HGB: 11.7 g/dL (ref 11.6–15.9)
LYMPH%: 13.1 % — ABNORMAL LOW (ref 14.0–49.7)
MCH: 28.5 pg (ref 25.1–34.0)
MCHC: 32.1 g/dL (ref 31.5–36.0)
MCV: 88.8 fL (ref 79.5–101.0)
MONO#: 0.8 10*3/uL (ref 0.1–0.9)
MONO%: 7.2 % (ref 0.0–14.0)
NEUT#: 8.9 10*3/uL — ABNORMAL HIGH (ref 1.5–6.5)
NEUT%: 78.6 % — ABNORMAL HIGH (ref 38.4–76.8)
Platelets: 199 10*3/uL (ref 145–400)
RBC: 4.11 10*6/uL (ref 3.70–5.45)
RDW: 13.6 % (ref 11.2–14.5)
WBC: 11.3 10*3/uL — ABNORMAL HIGH (ref 3.9–10.3)
lymph#: 1.5 10*3/uL (ref 0.9–3.3)

## 2016-04-19 MED ORDER — DOXORUBICIN HCL CHEMO IV INJECTION 2 MG/ML
50.0000 mg/m2 | Freq: Once | INTRAVENOUS | Status: AC
  Administered 2016-04-19: 102 mg via INTRAVENOUS
  Filled 2016-04-19: qty 51

## 2016-04-19 MED ORDER — SODIUM CHLORIDE 0.9 % IV SOLN
Freq: Once | INTRAVENOUS | Status: AC
  Administered 2016-04-19: 12:00:00 via INTRAVENOUS

## 2016-04-19 MED ORDER — SODIUM CHLORIDE 0.9% FLUSH
10.0000 mL | INTRAVENOUS | Status: DC | PRN
  Filled 2016-04-19: qty 10

## 2016-04-19 MED ORDER — HEPARIN SOD (PORK) LOCK FLUSH 100 UNIT/ML IV SOLN
500.0000 [IU] | Freq: Once | INTRAVENOUS | Status: DC | PRN
  Filled 2016-04-19: qty 5

## 2016-04-19 MED ORDER — SODIUM CHLORIDE 0.9 % IV SOLN
Freq: Once | INTRAVENOUS | Status: AC
  Administered 2016-04-19: 12:00:00 via INTRAVENOUS
  Filled 2016-04-19: qty 5

## 2016-04-19 MED ORDER — CYCLOPHOSPHAMIDE CHEMO INJECTION 1 GM
500.0000 mg/m2 | Freq: Once | INTRAMUSCULAR | Status: AC
  Administered 2016-04-19: 1020 mg via INTRAVENOUS
  Filled 2016-04-19: qty 51

## 2016-04-19 MED ORDER — PALONOSETRON HCL INJECTION 0.25 MG/5ML
0.2500 mg | Freq: Once | INTRAVENOUS | Status: AC
  Administered 2016-04-19: 0.25 mg via INTRAVENOUS

## 2016-04-19 MED ORDER — PALONOSETRON HCL INJECTION 0.25 MG/5ML
INTRAVENOUS | Status: AC
  Filled 2016-04-19: qty 5

## 2016-04-19 MED ORDER — PEGFILGRASTIM 6 MG/0.6ML ~~LOC~~ PSKT
6.0000 mg | PREFILLED_SYRINGE | Freq: Once | SUBCUTANEOUS | Status: AC
  Administered 2016-04-19: 6 mg via SUBCUTANEOUS
  Filled 2016-04-19: qty 0.6

## 2016-04-19 NOTE — Progress Notes (Signed)
Patient Care Team: [name], MD as PCP - General (Internal Medicine)  DIAGNOSIS: Breast cancer of upper-outer quadrant of left female breast [facility])   Staging form: Breast, AJCC 7th Edition     Clinical stage from 03/28/2016: Stage IIB (T2, N1, M0) - Unsigned   SUMMARY OF ONCOLOGIC HISTORY:   Breast cancer of upper-outer quadrant of left female breast ([place])   03/02/2016 Mammogram Hypoechoic masses in the upper outer quadrant left breast 2 adjacent irregular masses 2.5-1.4 x 1.2 cm and the other 2.7 x 1.4 x 2.3 cm total span 6.4 cm, enlarged multiple axillary lymph nodes measuring 1.7 cm, T2 N1/N2 stage IIb vs IIIa   03/19/2016 Initial Diagnosis Left breast biopsy 12:30 position: Invasive ductal carcinoma with DCIS, 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 She had done quite well over the past week. She has not had any nausea or fatigue issues. Her taste is better. She is starting to lose hair. Denies any arthralgias or myalgias.  REVIEW OF SYSTEMS:   Constitutional: Denies fevers, chills or abnormal weight loss Eyes: Denies blurriness of vision Ears, nose, mouth, throat, and face: Denies mucositis or sore throat Respiratory: Denies cough, dyspnea or wheezes Cardiovascular: Denies palpitation, chest discomfort Gastrointestinal:  Denies nausea, heartburn or change in bowel habits Skin: Denies abnormal skin rashes Lymphatics: Denies new lymphadenopathy or easy bruising Neurological:Denies numbness, tingling or new weaknesses Behavioral/Psych: Mood is stable, no new changes  Extremities: No lower  extremity edema Breast:  denies any pain or lumps or nodules in either breasts All other systems were reviewed with the patient and are negative.  I have reviewed the past medical history, past surgical history, social history and family history with the patient and they are unchanged from previous note.  ALLERGIES:  has No Known Allergies.  MEDICATIONS:  Current Outpatient Prescriptions  Medication Sig Dispense Refill  . Atorvastatin Calcium (INVESTIGATIONAL ATORVASTATIN/PLACEBO) 40 MG tablet [facility] [address] Take 1 tablet by mouth daily. Take 2 doses (these doses must be 12 hours apart) prior to first chemotherapy treatment. Then take 1 tablet daily by mouth with or without food. 180 tablet 0  . buPROPion (WELLBUTRIN XL) 150 MG 24 hr tablet Take 150 mg by mouth daily.  4  . citalopram (CELEXA) 40 MG tablet   3  . clonazePAM (KLONOPIN) 1 MG tablet Take 0.5 mg by mouth at bedtime.     Marland Kitchen dexamethasone (DECADRON) 4 MG tablet Take 1 tablet (4 mg total) by mouth 2 (two) times daily. 1 tab two times daily for 2 days. Take with food. 30 tablet 1  . lidocaine-prilocaine (EMLA) cream Apply to affected area once 30 g 3  . loratadine (CLARITIN) 10 MG tablet Take 10 mg by mouth daily.    Marland Kitchen LORazepam (ATIVAN) 0.5 MG tablet Take 0.5 mg by mouth daily as needed. For anxiety.  0  . nystatin-triamcinolone ointment (MYCOLOG) Apply 1 application topically 2 (two) times daily. (Patient taking differently: Apply 1 application topically 2 (two) times daily as needed (for itching skin.). ) 60 g 3  . ondansetron (ZOFRAN) 8 MG tablet Take 1 tablet (8  mg total) by mouth 2 (two) times daily as needed. Start on the third day after chemotherapy. 30 tablet 1  . pramipexole (MIRAPEX) 0.5 MG tablet Take 0.5 mg by mouth at bedtime.    . prochlorperazine (COMPAZINE) 10 MG tablet Take 1 tablet (10 mg total) by mouth every 6 (six) hours as needed (Nausea or vomiting). 30 tablet 1  . RESTASIS 0.05 % ophthalmic emulsion Place  1 drop into both eyes 2 (two) times daily as needed. For dry eyes.  4  . traMADol (ULTRAM) 50 MG tablet Take 1 tablet (50 mg total) by mouth every 6 (six) hours as needed. (Patient taking differently: Take 50 mg by mouth every 6 (six) hours as needed for moderate pain. ) 20 tablet 0  . zolpidem (AMBIEN) 5 MG tablet Take 5 mg by mouth at bedtime.     No current facility-administered medications for this visit.    PHYSICAL EXAMINATION: ECOG PERFORMANCE STATUS: 1 - Symptomatic but completely ambulatory  Filed Vitals:   04/19/16 1050  BP: 137/55  Pulse: 66  Temp: 98.2 F (36.8 C)  Resp: 18   Filed Weights   04/19/16 1050  Weight: 201 lb 8 oz (91.4 kg)    GENERAL:alert, no distress and comfortable SKIN: skin color, texture, turgor are normal, no rashes or significant lesions EYES: normal, Conjunctiva are pink and non-injected, sclera clear OROPHARYNX:no exudate, no erythema and lips, buccal mucosa, and tongue normal  NECK: supple, thyroid normal size, non-tender, without nodularity LYMPH:  no palpable lymphadenopathy in the cervical, axillary or inguinal LUNGS: clear to auscultation and percussion with normal breathing effort HEART: regular rate & rhythm and no murmurs and no lower extremity edema ABDOMEN:abdomen soft, non-tender and normal bowel sounds MUSCULOSKELETAL:no cyanosis of digits and no clubbing  NEURO: alert & oriented x 3 with fluent speech, no focal motor/sensory deficits EXTREMITIES: No lower extremity edema  LABORATORY DATA:  I have reviewed the data as listed   Chemistry      Component Value Date/Time   NA 139 04/12/2016 0941   NA 141 04/07/2016 0345   K 4.8 04/12/2016 0941   K 4.9 04/07/2016 0345   CL 108 04/07/2016 0345   CO2 26 04/12/2016 0941   CO2 25 04/07/2016 0345   BUN 13.9 04/12/2016 0941   BUN 25* 04/07/2016 0345   CREATININE 0.9 04/12/2016 0941   CREATININE 1.02* 04/07/2016 0345      Component Value Date/Time   CALCIUM 9.5 04/12/2016 0941     CALCIUM 9.5 04/07/2016 0345   ALKPHOS 81 04/12/2016 0941   ALKPHOS 92 10/06/2015 0616   AST 10 04/12/2016 0941   AST 61* 10/06/2015 0616   ALT 15 04/12/2016 0941   ALT 188* 10/06/2015 0616   BILITOT 0.53 04/12/2016 0941   BILITOT 0.5 10/06/2015 0616       Lab Results  Component Value Date   WBC 11.3* 04/19/2016   HGB 11.7 04/19/2016   HCT 36.5 04/19/2016   MCV 88.8 04/19/2016   PLT 199 04/19/2016   NEUTROABS 8.9* 04/19/2016     ASSESSMENT & PLAN:  Breast cancer of upper-outer quadrant of left female breast ([place]) Mammogram 03/02/2016: Hypoechoic masses in the upper outer quadrant left breast 2 adjacent irregular masses 2.5-1.4 x 1.2 cm and the other 2.7 x 1.4 x 2.3 cm total span 6.4 cm, enlarged multiple axillary lymph nodes measuring 1.7 cm, T2 N1/N2 stage IIb vs IIIa Left breast biopsy 03/19/2016 12:30 position: Invasive ductal carcinoma with DCIS, 1/1 lymph  node positive, grade 3, ER 0%, PR 0%, HER-2 negative ratio 1.68, Ki-67 90%  Treatment plan based on multidisciplinary tumor board: 1. Neoadjuvant chemotherapy with Adriamycin and Cytoxan dose dense 4 followed by Abraxane weekly 12 2. Followed by breast conserving surgery with sentinel lymph node study vs targeted axillary dissection 3. Followed by adjuvant radiation therapy ------------------------------------------------------------------------------------------------------------------------------------ PREVENT trial: [id number]: Patient is enrolled in this trial atorvastatin versus placebo  No side effects related to the study drug. Current treatment: Today cycle 2 day 1 dose dense Adriamycin and Cytoxan  Chemotherapy toxicities: 1. Emergency room visit for severe restless leg syndrome resolved after she received Dilaudid. It is felt to be related to dexamethasone. D/C dexamethasone from subsequent treatments. I also asked patient not to take oral dexamethasone. 2. Profound neutropenia: decreased the dose of  cycle 2 chemotherapy  3. Profound fatigue  4. Grade 1-2 nausea  5. Depressive symptoms  Monitoring closely for toxicities Return to clinic in 2 weeks for cycle 3   No orders of the defined types were placed in this encounter.   The patient has a good understanding of the overall plan. she agrees with it. she will call with any problems that may develop before the next visit here.   Rulon Eisenmenger, MD 04/19/2016

## 2016-04-19 NOTE — Assessment & Plan Note (Signed)
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 Neoadjuvant chemotherapy with Adriamycin and Cytoxan dose dense 4 followed by Abraxane weekly 12 2. Followed by breast conserving surgery with sentinel lymph node study vs targeted axillary dissection 3. Followed by adjuvant radiation therapy ------------------------------------------------------------------------------------------------------------------------------------ PREVENT trial: [id number]: Patient is enrolled in this trial atorvastatin versus placebo  Current treatment: Today cycle 1 day 8 dose dense Adriamycin and Cytoxan  Chemotherapy toxicities: 1. Emergency room visit for severe restless leg syndrome resolved after she received Dilaudid. It is felt to be related to dexamethasone. D/C dexamethasone from subsequent treatments. I also asked patient not to take oral dexamethasone. 2. Profound neutropenia: decreased the dose of cycle 2 chemotherapy  3. Profound fatigue  4. Grade 1-2 nausea  5. Depressive symptoms  Monitoring closely for toxicities Return to clinic in 2 weeks for cycle 3

## 2016-04-19 NOTE — Patient Instructions (Signed)
[facility] Discharge Instructions for Patients Receiving Chemotherapy  Today you received the following chemotherapy agents Adriamycin/Cytoxan.  To help prevent nausea and vomiting after your treatment, we encourage you to take your nausea medication as prescribed.    If you develop nausea and vomiting that is not controlled by your nausea medication, call the clinic.   BELOW ARE SYMPTOMS THAT SHOULD BE REPORTED IMMEDIATELY:  *FEVER GREATER THAN 100.5 F  *CHILLS WITH OR WITHOUT FEVER  NAUSEA AND VOMITING THAT IS NOT CONTROLLED WITH YOUR NAUSEA MEDICATION  *UNUSUAL SHORTNESS OF BREATH  *UNUSUAL BRUISING OR BLEEDING  TENDERNESS IN MOUTH AND THROAT WITH OR WITHOUT PRESENCE OF ULCERS  *URINARY PROBLEMS  *BOWEL PROBLEMS  UNUSUAL RASH Items with * indicate a potential emergency and should be followed up as soon as possible.  Feel free to call the clinic you have any questions or concerns. The clinic phone number is [phone].  Please show the CHEMO ALERT CARD at check-in to the Emergency Department and triage nurse.   

## 2016-04-19 NOTE — Progress Notes (Signed)
04/19/16 at 11:37am - Alexis Fox - cycle 2, day 1 study notes- The pt was into the cancer center this morning for her cycle 2 AC treatment.  The pt was seen and examined today by Dr. Lindi Adie. The pt's labs are within acceptable limits for treatment today. The pt reports that she tolerating her study drug well with no adverse events.  She said that she feels that she is "probably on the placebo" because she doesn't feel "any different".  The research nurse informed the pt that the study is a blinded study.  The pt denied any difficulty in filling out her medication calendars and recording her daily dose of study drug.  The research nurse told the pt that the calendars will need to be returned and sent to [facility] every other month.  The pt was thanked for her continued support of the trial. Brion Aliment RN, BSN, CCRP Clinical Research Nurse 04/19/2016 11:43 AM

## 2016-04-20 ENCOUNTER — Ambulatory Visit: Payer: 59 | Admitting: Hematology and Oncology

## 2016-04-23 ENCOUNTER — Encounter: Payer: Self-pay | Admitting: Genetic Counselor

## 2016-04-23 ENCOUNTER — Other Ambulatory Visit: Payer: 59

## 2016-04-23 ENCOUNTER — Ambulatory Visit (HOSPITAL_BASED_OUTPATIENT_CLINIC_OR_DEPARTMENT_OTHER): Payer: 59 | Admitting: Genetic Counselor

## 2016-04-23 DIAGNOSIS — Z803 Family history of malignant neoplasm of breast: Secondary | ICD-10-CM | POA: Insufficient documentation

## 2016-04-23 DIAGNOSIS — Z808 Family history of malignant neoplasm of other organs or systems: Secondary | ICD-10-CM | POA: Diagnosis not present

## 2016-04-23 DIAGNOSIS — Z315 Encounter for genetic counseling: Secondary | ICD-10-CM | POA: Diagnosis not present

## 2016-04-23 DIAGNOSIS — C50412 Malignant neoplasm of upper-outer quadrant of left female breast: Secondary | ICD-10-CM | POA: Diagnosis not present

## 2016-04-23 NOTE — Progress Notes (Signed)
REFERRING PROVIDER: [name], MD [address] [place], [place] [address]   Alexis Brasil, MD  PRIMARY PROVIDER:  [name], MD  PRIMARY REASON FOR VISIT:  1. Breast cancer of upper-outer quadrant of left female breast ([place])   2. Family history of breast cancer   3. Family history of brain cancer      HISTORY OF PRESENT ILLNESS:   Alexis Fox, a 60 y.o. female, was seen for a  cancer genetics consultation at the request of Dr. Lindi Adie due to a personal and family history of cancer.  Alexis Fox presents to clinic today to discuss the possibility of a hereditary predisposition to cancer, genetic testing, and to further clarify her future cancer risks, as well as potential cancer risks for family members.   In March 19, 2016, at the age of 52, Alexis Fox was diagnosed with invasive ductal carcinoma of the left breast. The tumor is triple negative. This was treated with chemotherapy, and she will have surgery and radiation planned.   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  First live birth at age 89.  OCP use for approximately 3 years.  Ovaries intact: yes.  Hysterectomy: no.  Menopausal status: postmenopausal.  HRT use: 8 years. Colonoscopy: yes; normal. Mammogram  within the last year: yes. Number of breast biopsies: 1. Up to date with pelvic exams:  yes. Any excessive radiation exposure in the past:  no  Past Medical History  Diagnosis Date  . Arthritis     knees  . Depression   . Restless leg syndrome   . Cholecystitis   . Anxiety   . Hot flashes   . Breast cancer of upper-outer quadrant of left female breast ([place]) 03/20/2016  . Family history of breast cancer   . Family history of brain cancer     Past Surgical History  Procedure Laterality Date  . Elbow surgery      right for epicondylitis 2010   . Bunionectomy    . Foot surgery      1983 -tarsal tunnel release  . Lumbar disc surgery  04/04/2012  . Tarsal tunnel release    . Spinal fusion  5/12    L5-S1  . Cholecystectomy N/A 10/04/2015    Procedure: LAPAROSCOPIC CHOLECYSTECTOMY WITH INTRAOPERATIVE CHOLANGIOGRAM;  Surgeon: Greer Pickerel, MD;  Location: [facility];  Service: General;  Laterality: N/A;  . Portacath placement Right 04/02/2016    Procedure: INSERTION PORT-A-CATH WITH [place];  Surgeon: Rolm Bookbinder, MD;  Location: [facility];  Service: General;  Laterality: Right;    Social History   Social History  . Marital Status: Married    Spouse Name: N/A  . Number of Children: 2  . Years of Education: N/A   Social History Main  Topics  . Smoking status: Never Smoker   . Smokeless tobacco: Never Used  . Alcohol Use: No  . Drug Use: No  . Sexual Activity: Yes    Birth Control/ Protection: Post-menopausal   Other Topics Concern  . None   Social History Narrative     FAMILY HISTORY:  We obtained a detailed, 4-generation family history.  Significant diagnoses are listed below: Family History  Problem Relation Age of Onset  . Anesthesia problems Neg Hx   . Heart disease Mother   . Pulmonary fibrosis Mother   . Hypertension Mother   . Cancer Father 24    astocytoma  . Lymphoma Maternal Aunt     The patient has two children who are cancer free.  She is  estranged from one or more of her siblings.  She has one sister and two brothers who are reportedly cancer free. Her father died at 38-63 from an astrocytoma.  Her mother passed away at 39 from pulmonary fibrosis.  Her father was an only child.  His parents died of old age.  Her mother had three sisters and two brothers.  One sister died from [place].  One maternal cousin had breast cancer in her 68s.  There is a reported great, great aunt who had breast cancer.  Patient's maternal ancestors are of [place] descent, and paternal ancestors are of unknown descent. There is no reported Ashkenazi Jewish ancestry. There is no known consanguinity.  GENETIC COUNSELING ASSESSMENT: Alexis Fox is a 60 y.o. female with a personal and family history of breast cancer which is somewhat suggestive of a hereditary cancer syndrome and predisposition to cancer. We, therefore, discussed and recommended the following at today's visit.   DISCUSSION: We discussed that about 5-10% of breast cancer is hereditary with most cases due to BRCA mutations.  The patient has triple negative breast cancer, which can be associated with BRCA mutations, but also can be seen with PALB2.  Other genes associated with an increased risk for breast cancer include ATM and CHEK2.  We discussed that her father's astrocytoma is most likely not associated with her breast cancer, but in cases where we see young breast and young astrocytomas we can see TP53 mutations.  We reviewed the characteristics, features and inheritance patterns of hereditary cancer syndromes. We also discussed genetic testing, including the appropriate family members to test, the process of testing, insurance coverage and turn-around-time for results. We discussed the implications of a negative, positive and/or variant of uncertain significant result. We recommended Alexis Fox pursue genetic testing for the Breast/Ovarian cancer gene panel. The Breast/Ovarian gene panel offered by  [facility] includes sequencing and rearrangement analysis for the following 20 genes:  ATM, BARD1, BRCA1, BRCA2, BRIP1, CDH1, CHEK2, EPCAM, FANCC, MLH1, MSH2, MSH6, NBN, PALB2, PMS2, PTEN, RAD51C, RAD51D, TP53, and XRCC2.     Based on Alexis Fox's personal and family history of cancer, she meets medical criteria for genetic testing. Despite that she meets criteria, she may still have an out of pocket cost. We discussed that if her out of pocket cost for testing is over $100, the laboratory will call and confirm whether she wants to proceed with testing.  If the out of pocket cost of testing is less than $100 she will be billed by the genetic testing laboratory.   PLAN: After considering the risks, benefits, and limitations, Alexis Fox  provided informed consent to pursue genetic testing and the blood sample was sent to [facility] for analysis of  the Breast/Ovarian cancer panel. Results should be available within approximately 2-3 weeks' time, at which point they will be disclosed by telephone to Alexis Fox, as will any additional recommendations warranted by these results. Alexis Fox will receive a summary of her genetic counseling visit and a copy of her results once available. This information will also be available in Epic. We encouraged Alexis Fox to remain in contact with cancer genetics annually so that we can continuously update the family history and inform her of any changes in cancer genetics and testing that may be of benefit for her family. Alexis Fox questions were answered to her satisfaction today. Our contact information was provided should additional questions or concerns arise.  Lastly, we encouraged Alexis Fox to remain in contact with cancer genetics annually so that we can continuously update the family history and inform her of any changes in cancer genetics and testing that may be of benefit for this family.   Ms.  Fox questions were answered to her satisfaction today. Our  contact information was provided should additional questions or concerns arise. Thank you for the referral and allowing [place] to share in the care of your patient.   Evelene Roussin P. Florene Glen, [place], [facility] Certified Genetic Counselor [email or web address] phone: [phone]  The patient was seen for a total of 45 minutes in face-to-face genetic counseling.  This patient was discussed with Drs. Magrinat, Lindi Adie and/or Burr Medico who agrees with the above.    _______________________________________________________________________ For Office Staff:  Number of people involved in session: 2 Was an Intern/ student involved with case: no

## 2016-04-26 ENCOUNTER — Other Ambulatory Visit: Payer: 59

## 2016-04-26 ENCOUNTER — Ambulatory Visit: Payer: 59 | Admitting: Hematology and Oncology

## 2016-04-27 ENCOUNTER — Encounter: Payer: Self-pay | Admitting: *Deleted

## 2016-05-03 ENCOUNTER — Other Ambulatory Visit (HOSPITAL_BASED_OUTPATIENT_CLINIC_OR_DEPARTMENT_OTHER): Payer: 59

## 2016-05-03 ENCOUNTER — Encounter: Payer: Self-pay | Admitting: *Deleted

## 2016-05-03 ENCOUNTER — Encounter: Payer: Self-pay | Admitting: Hematology and Oncology

## 2016-05-03 ENCOUNTER — Ambulatory Visit (HOSPITAL_BASED_OUTPATIENT_CLINIC_OR_DEPARTMENT_OTHER): Payer: 59

## 2016-05-03 ENCOUNTER — Ambulatory Visit (HOSPITAL_BASED_OUTPATIENT_CLINIC_OR_DEPARTMENT_OTHER): Payer: 59 | Admitting: Hematology and Oncology

## 2016-05-03 VITALS — BP 122/67 | HR 82 | Temp 98.1°F | Resp 18 | Wt 197.8 lb

## 2016-05-03 DIAGNOSIS — Z006 Encounter for examination for normal comparison and control in clinical research program: Secondary | ICD-10-CM | POA: Diagnosis not present

## 2016-05-03 DIAGNOSIS — Z5111 Encounter for antineoplastic chemotherapy: Secondary | ICD-10-CM

## 2016-05-03 DIAGNOSIS — C50412 Malignant neoplasm of upper-outer quadrant of left female breast: Secondary | ICD-10-CM

## 2016-05-03 DIAGNOSIS — Z171 Estrogen receptor negative status [ER-]: Secondary | ICD-10-CM | POA: Diagnosis not present

## 2016-05-03 LAB — COMPREHENSIVE METABOLIC PANEL
ALT: 13 U/L (ref 0–55)
AST: 14 U/L (ref 5–34)
Albumin: 4.1 g/dL (ref 3.5–5.0)
Alkaline Phosphatase: 100 U/L (ref 40–150)
Anion Gap: 8 mEq/L (ref 3–11)
BUN: 12.8 mg/dL (ref 7.0–26.0)
CO2: 28 mEq/L (ref 22–29)
Calcium: 9.9 mg/dL (ref 8.4–10.4)
Chloride: 105 mEq/L (ref 98–109)
Creatinine: 1 mg/dL (ref 0.6–1.1)
EGFR: 64 mL/min/{1.73_m2} — ABNORMAL LOW (ref >90)
Glucose: 113 mg/dl (ref 70–140)
Potassium: 4.8 mEq/L (ref 3.5–5.1)
Sodium: 141 mEq/L (ref 136–145)
Total Bilirubin: <0.3 mg/dL (ref 0.20–1.20)
Total Protein: 7.7 g/dL (ref 6.4–8.3)

## 2016-05-03 LAB — CBC WITH DIFFERENTIAL/PLATELET
BASO%: 0.5 % (ref 0.0–2.0)
Basophils Absolute: 0.1 10*3/uL (ref 0.0–0.1)
EOS%: 0.7 % (ref 0.0–7.0)
Eosinophils Absolute: 0.1 10*3/uL (ref 0.0–0.5)
HCT: 38.6 % (ref 34.8–46.6)
HGB: 12.6 g/dL (ref 11.6–15.9)
LYMPH%: 8.8 % — ABNORMAL LOW (ref 14.0–49.7)
MCH: 28.6 pg (ref 25.1–34.0)
MCHC: 32.6 g/dL (ref 31.5–36.0)
MCV: 87.8 fL (ref 79.5–101.0)
MONO#: 1.1 10*3/uL — ABNORMAL HIGH (ref 0.1–0.9)
MONO%: 7.5 % (ref 0.0–14.0)
NEUT#: 11.8 10*3/uL — ABNORMAL HIGH (ref 1.5–6.5)
NEUT%: 82.5 % — ABNORMAL HIGH (ref 38.4–76.8)
Platelets: 163 10*3/uL (ref 145–400)
RBC: 4.39 10*6/uL (ref 3.70–5.45)
RDW: 13.8 % (ref 11.2–14.5)
WBC: 14.3 10*3/uL — ABNORMAL HIGH (ref 3.9–10.3)
lymph#: 1.3 10*3/uL (ref 0.9–3.3)

## 2016-05-03 MED ORDER — PALONOSETRON HCL INJECTION 0.25 MG/5ML
INTRAVENOUS | Status: AC
  Filled 2016-05-03: qty 5

## 2016-05-03 MED ORDER — SODIUM CHLORIDE 0.9 % IV SOLN
500.0000 mg/m2 | Freq: Once | INTRAVENOUS | Status: AC
  Administered 2016-05-03: 1020 mg via INTRAVENOUS
  Filled 2016-05-03: qty 51

## 2016-05-03 MED ORDER — DOXORUBICIN HCL CHEMO IV INJECTION 2 MG/ML
50.0000 mg/m2 | Freq: Once | INTRAVENOUS | Status: AC
  Administered 2016-05-03: 102 mg via INTRAVENOUS
  Filled 2016-05-03: qty 51

## 2016-05-03 MED ORDER — SODIUM CHLORIDE 0.9% FLUSH
10.0000 mL | INTRAVENOUS | Status: DC | PRN
  Administered 2016-05-03: 10 mL
  Filled 2016-05-03: qty 10

## 2016-05-03 MED ORDER — PALONOSETRON HCL INJECTION 0.25 MG/5ML
0.2500 mg | Freq: Once | INTRAVENOUS | Status: AC
  Administered 2016-05-03: 0.25 mg via INTRAVENOUS

## 2016-05-03 MED ORDER — FOSAPREPITANT DIMEGLUMINE INJECTION 150 MG
Freq: Once | INTRAVENOUS | Status: AC
  Administered 2016-05-03: 11:00:00 via INTRAVENOUS
  Filled 2016-05-03: qty 5

## 2016-05-03 MED ORDER — PEGFILGRASTIM 6 MG/0.6ML ~~LOC~~ PSKT
6.0000 mg | PREFILLED_SYRINGE | Freq: Once | SUBCUTANEOUS | Status: AC
  Administered 2016-05-03: 6 mg via SUBCUTANEOUS
  Filled 2016-05-03: qty 0.6

## 2016-05-03 MED ORDER — SODIUM CHLORIDE 0.9 % IV SOLN
Freq: Once | INTRAVENOUS | Status: AC
  Administered 2016-05-03: 11:00:00 via INTRAVENOUS

## 2016-05-03 MED ORDER — HEPARIN SOD (PORK) LOCK FLUSH 100 UNIT/ML IV SOLN
500.0000 [IU] | Freq: Once | INTRAVENOUS | Status: AC | PRN
  Administered 2016-05-03: 500 [IU]
  Filled 2016-05-03: qty 5

## 2016-05-03 NOTE — Assessment & Plan Note (Signed)
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 Neoadjuvant chemotherapy with Adriamycin and Cytoxan dose dense 4 followed by Abraxane weekly 12 2. Followed by breast conserving surgery with sentinel lymph node study vs targeted axillary dissection 3. Followed by adjuvant radiation therapy ------------------------------------------------------------------------------------------------------------------------------------ PREVENT trial: [id number]: Patient is enrolled in this trial atorvastatin versus placebo  No side effects related to the study drug. Current treatment: Today cycle 3 day 1 dose dense Adriamycin and Cytoxan  Chemotherapy toxicities: 1. Emergency room visit for severe restless leg syndrome resolved after she received Dilaudid. It is felt to be related to dexamethasone. D/C dexamethasone from subsequent treatments. I also asked patient not to take oral dexamethasone. 2. Profound neutropenia: decreased the dose of cycle 2 chemotherapy  3. Profound fatigue  4. Grade 1-2 nausea  5. Depressive symptoms  Monitoring closely for toxicities Return to clinic in 2 weeks for cycle 4

## 2016-05-03 NOTE — Patient Instructions (Signed)
[facility] Discharge Instructions for Patients Receiving Chemotherapy  Today you received the following chemotherapy agents Adriamycin and Cytoxan.  To help prevent nausea and vomiting after your treatment, we encourage you to take your nausea medication as prescribed.   If you develop nausea and vomiting that is not controlled by your nausea medication, call the clinic.   BELOW ARE SYMPTOMS THAT SHOULD BE REPORTED IMMEDIATELY:  *FEVER GREATER THAN 100.5 F  *CHILLS WITH OR WITHOUT FEVER  NAUSEA AND VOMITING THAT IS NOT CONTROLLED WITH YOUR NAUSEA MEDICATION  *UNUSUAL SHORTNESS OF BREATH  *UNUSUAL BRUISING OR BLEEDING  TENDERNESS IN MOUTH AND THROAT WITH OR WITHOUT PRESENCE OF ULCERS  *URINARY PROBLEMS  *BOWEL PROBLEMS  UNUSUAL RASH Items with * indicate a potential emergency and should be followed up as soon as possible.  Feel free to call the clinic you have any questions or concerns. The clinic phone number is [phone].  Please show the CHEMO ALERT CARD at check-in to the Emergency Department and triage nurse.   

## 2016-05-03 NOTE — Progress Notes (Signed)
05/03/16 at 9:59am- The research nurse received the pt's baseline clinical MRI report from [place] [facility].  The research nurse took the report to Dr. Lindi Adie.  Dr. Lindi Adie reviewed the report and signed/dated the form.  The report was marked "negative for alert findings". The report will be scanned and filed in the participant's medical record. Brion Aliment RN, BSN, CCRP Clinical Research Nurse 05/03/2016 10:03 AM

## 2016-05-03 NOTE — Progress Notes (Signed)
Patient Care Team: [name], MD as PCP - General (Internal Medicine)  DIAGNOSIS: Breast cancer of upper-outer quadrant of left female breast [facility])   Staging form: Breast, AJCC 7th Edition     Clinical stage from 03/28/2016: Stage IIB (T2, N1, M0) - Unsigned  SUMMARY OF ONCOLOGIC HISTORY:   Breast cancer of upper-outer quadrant of left female breast ([place])   03/02/2016 Mammogram Hypoechoic masses in the upper outer quadrant left breast 2 adjacent irregular masses 2.5-1.4 x 1.2 cm and the other 2.7 x 1.4 x 2.3 cm total span 6.4 cm, enlarged multiple axillary lymph nodes measuring 1.7 cm, T2 N1/N2 stage IIb vs IIIa   03/19/2016 Initial Diagnosis Left breast biopsy 12:30 position: Invasive ductal carcinoma with DCIS, 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 Today is cycle #3. We had reduced dose with cycle #2. She had done much better than cycle 1. She had nausea issues for which she took Zofran and Compazine which helped her significantly. She however felt fatigued for 4-5 days after chemotherapy and started improving and she felt much better through the rest of the week. She had complete alopecia. Occasional mild sore and nail bed changes.  REVIEW OF SYSTEMS:   Constitutional: Denies fevers, chills or abnormal weight loss, complains of severe fatigue and alopecia Eyes: Denies blurriness of vision Ears, nose, mouth, throat, and face: Denies mucositis or sore throat Respiratory: Denies cough, dyspnea or wheezes Cardiovascular: Denies palpitation, chest  discomfort Gastrointestinal:  Intermittent nausea Skin: Denies abnormal skin rashes Lymphatics: Denies new lymphadenopathy or easy bruising Neurological:Denies numbness, tingling or new weaknesses Behavioral/Psych: Mood is stable, no new changes  Extremities: No lower extremity edema  All other systems were reviewed with the patient and are negative.  I have reviewed the past medical history, past surgical history, social history and family history with the patient and they are unchanged from previous note.  ALLERGIES:  has No Known Allergies.  MEDICATIONS:  Current Outpatient Prescriptions  Medication Sig Dispense Refill  . Atorvastatin Calcium (INVESTIGATIONAL ATORVASTATIN/PLACEBO) 40 MG tablet [facility] [place] [address] Take 1 tablet by mouth daily. Take 2 doses (these doses must be 12 hours apart) prior to first chemotherapy treatment. Then take 1 tablet daily by mouth with or without food. 180 tablet 0  . buPROPion (WELLBUTRIN XL) 150 MG 24 hr tablet Take 150 mg by mouth daily.  4  . citalopram (CELEXA) 40 MG tablet   3  . clonazePAM (KLONOPIN) 1 MG tablet Take 0.5 mg by mouth at bedtime.     Marland Kitchen dexamethasone (DECADRON) 4 MG tablet Take 1 tablet (4 mg total) by mouth 2 (two) times daily. 1 tab two times daily for 2 days. Take with food. 30 tablet 1  . lidocaine-prilocaine (EMLA) cream Apply to affected area once 30 g 3  . loratadine (CLARITIN) 10 MG tablet Take 10 mg by mouth daily.    Marland Kitchen LORazepam (ATIVAN) 0.5 MG tablet Take 0.5 mg by mouth daily as needed. For anxiety.  0  . nystatin-triamcinolone ointment (MYCOLOG) Apply 1 application topically 2 (two) times daily. (Patient taking differently:  Apply 1 application topically 2 (two) times daily as needed (for itching skin.). ) 60 g 3  . ondansetron (ZOFRAN) 8 MG tablet Take 1 tablet (8 mg total) by mouth 2 (two) times daily as needed. Start on the third day after chemotherapy. 30 tablet 1  . pramipexole (MIRAPEX) 0.5 MG tablet Take 0.5 mg by  mouth at bedtime.    . prochlorperazine (COMPAZINE) 10 MG tablet Take 1 tablet (10 mg total) by mouth every 6 (six) hours as needed (Nausea or vomiting). 30 tablet 1  . RESTASIS 0.05 % ophthalmic emulsion Place 1 drop into both eyes 2 (two) times daily as needed. For dry eyes.  4  . traMADol (ULTRAM) 50 MG tablet Take 1 tablet (50 mg total) by mouth every 6 (six) hours as needed. (Patient taking differently: Take 50 mg by mouth every 6 (six) hours as needed for moderate pain. ) 20 tablet 0  . zolpidem (AMBIEN) 5 MG tablet Take 5 mg by mouth at bedtime.     No current facility-administered medications for this visit.    PHYSICAL EXAMINATION: ECOG PERFORMANCE STATUS: 1 - Symptomatic but completely ambulatory  Filed Vitals:   05/03/16 1020  BP: 122/67  Pulse: 82  Temp: 98.1 F (36.7 C)  Resp: 18   Filed Weights   05/03/16 1020  Weight: 197 lb 12.8 oz (89.721 kg)    GENERAL:alert, no distress and comfortable SKIN: skin color, texture, turgor are normal, no rashes or significant lesions EYES: normal, Conjunctiva are pink and non-injected, sclera clear OROPHARYNX:no exudate, no erythema and lips, buccal mucosa, and tongue normal  NECK: supple, thyroid normal size, non-tender, without nodularity LYMPH:  no palpable lymphadenopathy in the cervical, axillary or inguinal LUNGS: clear to auscultation and percussion with normal breathing effort HEART: regular rate & rhythm and no murmurs and no lower extremity edema ABDOMEN:abdomen soft, non-tender and normal bowel sounds MUSCULOSKELETAL:no cyanosis of digits and no clubbing  NEURO: alert & oriented x 3 with fluent speech, no focal motor/sensory deficits EXTREMITIES: No lower extremity edema  LABORATORY DATA:  I have reviewed the data as listed   Chemistry      Component Value Date/Time   NA 141 04/19/2016 1036   NA 141 04/07/2016 0345   K 4.3 04/19/2016 1036   K 4.9 04/07/2016 0345   CL 108 04/07/2016 0345   CO2 28 04/19/2016  1036   CO2 25 04/07/2016 0345   BUN 11.6 04/19/2016 1036   BUN 25* 04/07/2016 0345   CREATININE 1.0 04/19/2016 1036   CREATININE 1.02* 04/07/2016 0345      Component Value Date/Time   CALCIUM 9.6 04/19/2016 1036   CALCIUM 9.5 04/07/2016 0345   ALKPHOS 85 04/19/2016 1036   ALKPHOS 92 10/06/2015 0616   AST 11 04/19/2016 1036   AST 61* 10/06/2015 0616   ALT 12 04/19/2016 1036   ALT 188* 10/06/2015 0616   BILITOT <0.30 04/19/2016 1036   BILITOT 0.5 10/06/2015 0616       Lab Results  Component Value Date   WBC 14.3* 05/03/2016   HGB 12.6 05/03/2016   HCT 38.6 05/03/2016   MCV 87.8 05/03/2016   PLT 163 05/03/2016   NEUTROABS 11.8* 05/03/2016     ASSESSMENT & PLAN:  Breast cancer of upper-outer quadrant of left female breast ([place]) Mammogram 03/02/2016: Hypoechoic masses in the upper outer quadrant left breast 2 adjacent irregular masses 2.5-1.4 x 1.2 cm and the other 2.7 x 1.4 x 2.3 cm total span 6.4 cm,  enlarged multiple axillary lymph nodes measuring 1.7 cm, T2 N1/N2 stage IIb vs IIIa Left breast biopsy 03/19/2016 12:30 position: Invasive ductal carcinoma with DCIS, 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 Neoadjuvant chemotherapy with Adriamycin and Cytoxan dose dense 4 followed by Abraxane weekly 12 2. Followed by breast conserving surgery with sentinel lymph node study vs targeted axillary dissection 3. Followed by adjuvant radiation therapy ------------------------------------------------------------------------------------------------------------------------------------ PREVENT trial: [id number]: Patient is enrolled in this trial atorvastatin versus placebo  No side effects related to the study drug. Current treatment: Today cycle 3 day 1 dose dense Adriamycin and Cytoxan  Chemotherapy toxicities: 1. Emergency room visit for severe restless leg syndrome resolved  after she received Dilaudid. It is felt to be related to dexamethasone. D/C dexamethasone from subsequent treatments. I also asked patient not to take oral dexamethasone. 2. Profound neutropenia (at [facility]): decreased the dose of cycle 2 chemotherapy  3. Profound fatigue  4. Grade 1-2 nausea  5. Depressive symptoms: Slight improvement than before  Monitoring closely for toxicities Return to clinic in 2 weeks for cycle 4   No orders of the defined types were placed in this encounter.   The patient has a good understanding of the overall plan. she agrees with it. she will call with any problems that may develop before the next visit here.   Rulon Eisenmenger, MD 05/03/2016

## 2016-05-04 DIAGNOSIS — C50412 Malignant neoplasm of upper-outer quadrant of left female breast: Secondary | ICD-10-CM | POA: Diagnosis not present

## 2016-05-04 DIAGNOSIS — Z803 Family history of malignant neoplasm of breast: Secondary | ICD-10-CM | POA: Diagnosis not present

## 2016-05-04 DIAGNOSIS — Z808 Family history of malignant neoplasm of other organs or systems: Secondary | ICD-10-CM | POA: Diagnosis not present

## 2016-05-09 MED FILL — ZOLPIDEM TARTRATE 5 MG TAB: 5 | 90 days supply | Qty: 90 | Fill #0

## 2016-05-10 ENCOUNTER — Other Ambulatory Visit: Payer: 59

## 2016-05-10 ENCOUNTER — Ambulatory Visit: Payer: 59 | Admitting: Hematology and Oncology

## 2016-05-14 ENCOUNTER — Ambulatory Visit (HOSPITAL_COMMUNITY)
Admission: RE | Admit: 2016-05-14 | Discharge: 2016-05-14 | Disposition: A | Discharge status: Home / Self Care | Payer: 59 | Source: Ambulatory Visit | Attending: Internal Medicine | Admitting: Internal Medicine

## 2016-05-14 ENCOUNTER — Encounter (HOSPITAL_COMMUNITY): Payer: Self-pay | Admitting: Internal Medicine

## 2016-05-14 ENCOUNTER — Ambulatory Visit (HOSPITAL_BASED_OUTPATIENT_CLINIC_OR_DEPARTMENT_OTHER)
Admission: RE | Admit: 2016-05-14 | Discharge: 2016-05-14 | Disposition: A | Discharge status: Home / Self Care | Payer: 59 | Source: Ambulatory Visit | Attending: Internal Medicine | Admitting: Internal Medicine

## 2016-05-14 VITALS — BP 120/62 | HR 66 | Wt 199.0 lb

## 2016-05-14 DIAGNOSIS — C50412 Malignant neoplasm of upper-outer quadrant of left female breast: Secondary | ICD-10-CM

## 2016-05-14 DIAGNOSIS — Z09 Encounter for follow-up examination after completed treatment for conditions other than malignant neoplasm: Secondary | ICD-10-CM | POA: Diagnosis present

## 2016-05-14 LAB — ECHOCARDIOGRAM COMPLETE
E decel time: 232 msec
E/e' ratio: 12.2
FS: 44 % (ref 28–44)
IVS/LV PW RATIO, ED: 1.23
LA ID, A-P, ES: 37 mm
LA diam end sys: 37 mm
LA diam index: 1.9 cm/m2
LA vol A4C: 51 ml
LA vol index: 27.2 mL/m2
LA vol: 53 mL
LV E/e' medial: 12.2
LV E/e'average: 12.2
LV PW d: 7.42 mm — AB (ref 0.6–1.1)
LV e' LATERAL: 6.96 cm/s
LVOT area: 3.14 cm2
LVOT diameter: 20 mm
MV Dec: 232
MV Peak grad: 3 mmHg
MV pk A vel: 72.3 m/s
MV pk E vel: 84.9 m/s
Reg peak vel: 273 cm/s
TAPSE: 28.4 mm
TDI e' lateral: 6.96
TDI e' medial: 8.16
TR max vel: 273 cm/s

## 2016-05-14 NOTE — Patient Instructions (Signed)
Follow up and Echo in 6 months 

## 2016-05-14 NOTE — Progress Notes (Signed)
Patient ID: Alexis Fox, female   DOB: [birth year], 60 y.o.   MRN: [id number] Patient ID: Alexis Fox, female   DOB: Dec 20, 1955, 60 y.o.   MRN: [id number]  [place] NOTE   Patient Care Team: [name], MD as PCP - General (Internal Medicine)  Oncologist/Referring: Dr. 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 Lindi Adie for enrollment into the Cardio-Oncology Surveillance program.    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 Continues with profound fatigue. Prior to this was very active without exertional SOB or CP. Denies significant, edema, orthopnea or PND.   Echo 5/17: EF 60-65% lateral s' 9.5 cm/sec GLS -25.5% Echo 5/17: EF 60-65% lateral s' 10.5 cm/sec GLS -23.8%  ALLERGIES:  has No Known Allergies.  MEDICATIONS:  Current Outpatient Prescriptions  Medication Sig Dispense Refill  . Atorvastatin Calcium (INVESTIGATIONAL ATORVASTATIN/PLACEBO) 40 MG tablet [facility] [address] Take 1 tablet by mouth daily. Take 2 doses (these doses must be 12 hours apart) prior to first chemotherapy treatment. Then take 1 tablet daily by mouth with or without food. 180 tablet 0  . buPROPion (WELLBUTRIN XL) 150 MG 24 hr tablet Take 150 mg by  mouth daily.  4  . citalopram (CELEXA) 40 MG tablet   3  . clonazePAM (KLONOPIN) 1 MG tablet Take 0.5 mg by mouth at bedtime.     . lidocaine-prilocaine (EMLA) cream Apply to affected area once 30 g 3  . loratadine (CLARITIN) 10 MG tablet Take 10 mg by mouth daily.    Marland Kitchen nystatin-triamcinolone ointment (MYCOLOG) Apply 1 application topically 2 (two) times daily. 60 g 3  . ondansetron (ZOFRAN) 8 MG tablet Take 1 tablet (8 mg total) by mouth 2 (two) times daily as needed. Start on the third day after chemotherapy. 30 tablet 1  . pramipexole (MIRAPEX) 0.5 MG tablet Take 0.5 mg by mouth at bedtime.    . prochlorperazine (COMPAZINE) 10 MG tablet Take 1 tablet (10 mg total) by mouth every 6 (six) hours as needed (Nausea or vomiting). 30 tablet 1  . RESTASIS 0.05 % ophthalmic emulsion Place 1 drop into both eyes 2 (two) times daily as needed. For dry eyes.  4  . traMADol (ULTRAM) 50 MG tablet Take 1 tablet (50 mg total) by mouth every 6 (six) hours as needed. 20 tablet 0  . zolpidem (AMBIEN) 5 MG tablet Take 5 mg by mouth at bedtime.    Marland Kitchen LORazepam (ATIVAN) 0.5 MG tablet Take 0.5 mg by mouth daily as needed. Reported on 05/14/2016  0   No current facility-administered medications for this encounter.    PHYSICAL EXAMINATION:  Filed Vitals:   05/14/16 1455  BP: 120/62  Pulse: 66   Filed Weights  05/14/16 1455  Weight: 199 lb (90.266 kg)    General:  Fatigued appearing. No resp difficulty HEENT: normal Neck: supple. no JVD.RIJ port  Carotids 2+ bilat; no bruits. No lymphadenopathy or thryomegaly appreciated. Cor: PMI nondisplaced. Regular rate & rhythm. No rubs, gallops or murmurs. Lungs: clear Abdomen: obese soft, nontender, nondistended. No hepatosplenomegaly. No bruits or masses. Good bowel sounds. Extremities: no cyanosis, clubbing, rash, edema Neuro: alert & orientedx3, cranial nerves grossly intact. moves all 4 extremities w/o difficulty. Affect pleasant   LABORATORY DATA:  I have  reviewed the data as listed   Chemistry      Component Value Date/Time   NA 141 05/03/2016 1006   NA 141 04/07/2016 0345   K 4.8 05/03/2016 1006   K 4.9 04/07/2016 0345   CL 108 04/07/2016 0345   CO2 28 05/03/2016 1006   CO2 25 04/07/2016 0345   BUN 12.8 05/03/2016 1006   BUN 25* 04/07/2016 0345   CREATININE 1.0 05/03/2016 1006   CREATININE 1.02* 04/07/2016 0345      Component Value Date/Time   CALCIUM 9.9 05/03/2016 1006   CALCIUM 9.5 04/07/2016 0345   ALKPHOS 100 05/03/2016 1006   ALKPHOS 92 10/06/2015 0616   AST 14 05/03/2016 1006   AST 61* 10/06/2015 0616   ALT 13 05/03/2016 1006   ALT 188* 10/06/2015 0616   BILITOT <0.30 05/03/2016 1006   BILITOT 0.5 10/06/2015 0616       Lab Results  Component Value Date   WBC 14.3* 05/03/2016   HGB 12.6 05/03/2016   HCT 38.6 05/03/2016   MCV 87.8 05/03/2016   PLT 163 05/03/2016   NEUTROABS 11.8* 05/03/2016   ASSESSMENT & PLAN:  1. Left Breast Cancer  - T2 N1/N2 stage IIb vs IIIa, ER 0%, PR 0%, HER-2 negative ratio 1.68, Ki-67 90%    --now s/p cycle 3/4 of dose dense Adriamycin and Cytoxan followed by Abraxane 12; PREVENT trial (atorvastatin versus placebo)    --Stable from cardiac perspective. I reviewed echos personally. EF and Doppler parameters stable. No HF on exam.      --Repeat echo in 6 months.     Glori Bickers, MD 04/05/2016

## 2016-05-14 NOTE — Progress Notes (Signed)
*  PRELIMINARY RESULTS* Echocardiogram 2D Echocardiogram has been performed.  Alexis Fox 05/14/2016, 3:03 PM

## 2016-05-14 NOTE — Addendum Note (Signed)
Encounter addended by: Harvie Junior, CMA on: 05/14/2016  3:47 PM<BR>     Documentation filed: Patient Instructions Section, Dx Association, Orders

## 2016-05-15 ENCOUNTER — Telehealth: Payer: Self-pay | Admitting: Genetic Counselor

## 2016-05-15 ENCOUNTER — Encounter: Payer: Self-pay | Admitting: Genetic Counselor

## 2016-05-15 DIAGNOSIS — Z1379 Encounter for other screening for genetic and chromosomal anomalies: Secondary | ICD-10-CM | POA: Insufficient documentation

## 2016-05-15 NOTE — Telephone Encounter (Signed)
LM on VM with good news.  Asked that she call back. 

## 2016-05-17 ENCOUNTER — Other Ambulatory Visit: Payer: Self-pay | Admitting: *Deleted

## 2016-05-17 ENCOUNTER — Other Ambulatory Visit (HOSPITAL_BASED_OUTPATIENT_CLINIC_OR_DEPARTMENT_OTHER): Payer: 59

## 2016-05-17 ENCOUNTER — Ambulatory Visit (HOSPITAL_BASED_OUTPATIENT_CLINIC_OR_DEPARTMENT_OTHER): Payer: 59 | Admitting: Hematology and Oncology

## 2016-05-17 ENCOUNTER — Telehealth: Payer: Self-pay | Admitting: Hematology and Oncology

## 2016-05-17 ENCOUNTER — Encounter: Payer: Self-pay | Admitting: *Deleted

## 2016-05-17 ENCOUNTER — Encounter: Payer: Self-pay | Admitting: Hematology and Oncology

## 2016-05-17 ENCOUNTER — Ambulatory Visit (HOSPITAL_BASED_OUTPATIENT_CLINIC_OR_DEPARTMENT_OTHER): Payer: 59

## 2016-05-17 ENCOUNTER — Ambulatory Visit: Payer: Self-pay | Admitting: Genetic Counselor

## 2016-05-17 VITALS — BP 114/60 | HR 78 | Temp 97.6°F | Resp 18 | Ht 64.0 in | Wt 196.9 lb

## 2016-05-17 DIAGNOSIS — D6481 Anemia due to antineoplastic chemotherapy: Secondary | ICD-10-CM | POA: Diagnosis not present

## 2016-05-17 DIAGNOSIS — C50412 Malignant neoplasm of upper-outer quadrant of left female breast: Secondary | ICD-10-CM | POA: Diagnosis not present

## 2016-05-17 DIAGNOSIS — Z006 Encounter for examination for normal comparison and control in clinical research program: Secondary | ICD-10-CM | POA: Diagnosis not present

## 2016-05-17 DIAGNOSIS — D701 Agranulocytosis secondary to cancer chemotherapy: Secondary | ICD-10-CM | POA: Diagnosis not present

## 2016-05-17 DIAGNOSIS — Z5111 Encounter for antineoplastic chemotherapy: Secondary | ICD-10-CM | POA: Diagnosis not present

## 2016-05-17 DIAGNOSIS — Z803 Family history of malignant neoplasm of breast: Secondary | ICD-10-CM

## 2016-05-17 DIAGNOSIS — Z5189 Encounter for other specified aftercare: Secondary | ICD-10-CM | POA: Diagnosis not present

## 2016-05-17 DIAGNOSIS — Z808 Family history of malignant neoplasm of other organs or systems: Secondary | ICD-10-CM

## 2016-05-17 DIAGNOSIS — Z1379 Encounter for other screening for genetic and chromosomal anomalies: Secondary | ICD-10-CM

## 2016-05-17 LAB — COMPREHENSIVE METABOLIC PANEL
ALT: 12 U/L (ref 0–55)
AST: 13 U/L (ref 5–34)
Albumin: 3.9 g/dL (ref 3.5–5.0)
Alkaline Phosphatase: 80 U/L (ref 40–150)
Anion Gap: 8 mEq/L (ref 3–11)
BUN: 10.8 mg/dL (ref 7.0–26.0)
CO2: 28 mEq/L (ref 22–29)
Calcium: 9.5 mg/dL (ref 8.4–10.4)
Chloride: 106 mEq/L (ref 98–109)
Creatinine: 0.9 mg/dL (ref 0.6–1.1)
EGFR: 73 mL/min/{1.73_m2} — ABNORMAL LOW (ref >90)
Glucose: 109 mg/dl (ref 70–140)
Potassium: 4.5 mEq/L (ref 3.5–5.1)
Sodium: 142 mEq/L (ref 136–145)
Total Bilirubin: <0.3 mg/dL (ref 0.20–1.20)
Total Protein: 7.1 g/dL (ref 6.4–8.3)

## 2016-05-17 LAB — CBC WITH DIFFERENTIAL/PLATELET
BASO%: 0.8 % (ref 0.0–2.0)
Basophils Absolute: 0.1 10*3/uL (ref 0.0–0.1)
EOS%: 1.3 % (ref 0.0–7.0)
Eosinophils Absolute: 0.1 10*3/uL (ref 0.0–0.5)
HCT: 34.8 % (ref 34.8–46.6)
HGB: 11.4 g/dL — ABNORMAL LOW (ref 11.6–15.9)
LYMPH%: 8.5 % — ABNORMAL LOW (ref 14.0–49.7)
MCH: 29 pg (ref 25.1–34.0)
MCHC: 32.7 g/dL (ref 31.5–36.0)
MCV: 88.7 fL (ref 79.5–101.0)
MONO#: 0.9 10*3/uL (ref 0.1–0.9)
MONO%: 8.8 % (ref 0.0–14.0)
NEUT#: 8.6 10*3/uL — ABNORMAL HIGH (ref 1.5–6.5)
NEUT%: 80.6 % — ABNORMAL HIGH (ref 38.4–76.8)
Platelets: 240 10*3/uL (ref 145–400)
RBC: 3.93 10*6/uL (ref 3.70–5.45)
RDW: 14.2 % (ref 11.2–14.5)
WBC: 10.7 10*3/uL — ABNORMAL HIGH (ref 3.9–10.3)
lymph#: 0.9 10*3/uL (ref 0.9–3.3)

## 2016-05-17 MED ORDER — DOXORUBICIN HCL CHEMO IV INJECTION 2 MG/ML
50.0000 mg/m2 | Freq: Once | INTRAVENOUS | Status: AC
  Administered 2016-05-17: 102 mg via INTRAVENOUS
  Filled 2016-05-17: qty 51

## 2016-05-17 MED ORDER — HEPARIN SOD (PORK) LOCK FLUSH 100 UNIT/ML IV SOLN
500.0000 [IU] | Freq: Once | INTRAVENOUS | Status: AC | PRN
  Administered 2016-05-17: 500 [IU]
  Filled 2016-05-17: qty 5

## 2016-05-17 MED ORDER — PALONOSETRON HCL INJECTION 0.25 MG/5ML
INTRAVENOUS | Status: AC
Start: 2016-05-17 — End: 2016-05-17
  Filled 2016-05-17: qty 5

## 2016-05-17 MED ORDER — SODIUM CHLORIDE 0.9 % IV SOLN
Freq: Once | INTRAVENOUS | Status: AC
  Administered 2016-05-17: 13:00:00 via INTRAVENOUS
  Filled 2016-05-17: qty 5

## 2016-05-17 MED ORDER — SODIUM CHLORIDE 0.9% FLUSH
10.0000 mL | INTRAVENOUS | Status: DC | PRN
  Administered 2016-05-17: 10 mL
  Filled 2016-05-17: qty 10

## 2016-05-17 MED ORDER — PEGFILGRASTIM 6 MG/0.6ML ~~LOC~~ PSKT
6.0000 mg | PREFILLED_SYRINGE | Freq: Once | SUBCUTANEOUS | Status: AC
  Administered 2016-05-17: 6 mg via SUBCUTANEOUS
  Filled 2016-05-17: qty 0.6

## 2016-05-17 MED ORDER — SODIUM CHLORIDE 0.9 % IV SOLN
Freq: Once | INTRAVENOUS | Status: AC
  Administered 2016-05-17: 13:00:00 via INTRAVENOUS

## 2016-05-17 MED ORDER — PALONOSETRON HCL INJECTION 0.25 MG/5ML
0.2500 mg | Freq: Once | INTRAVENOUS | Status: AC
  Administered 2016-05-17: 0.25 mg via INTRAVENOUS

## 2016-05-17 MED ORDER — SODIUM CHLORIDE 0.9 % IV SOLN
500.0000 mg/m2 | Freq: Once | INTRAVENOUS | Status: AC
  Administered 2016-05-17: 1020 mg via INTRAVENOUS
  Filled 2016-05-17: qty 51

## 2016-05-17 MED FILL — ONDANSETRON HCL 8 MG TABLET: 8 | 15 days supply | Qty: 30 | Fill #1

## 2016-05-17 NOTE — Telephone Encounter (Signed)
appt made and avs to be printed °

## 2016-05-17 NOTE — Assessment & Plan Note (Signed)
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 Neoadjuvant chemotherapy with Adriamycin and Cytoxan dose dense 4 followed by Abraxane weekly 12 2. Followed by breast conserving surgery with sentinel lymph node study vs targeted axillary dissection 3. Followed by adjuvant radiation therapy ------------------------------------------------------------------------------------------------------------------------------------ PREVENT trial: [id number]: Patient is enrolled in this trial atorvastatin versus placebo  No side effects related to the study drug. Current treatment: Today cycle 4 day 1 dose dense Adriamycin and Cytoxan  Chemotherapy toxicities: 1. Emergency room visit for severe restless leg syndrome resolved after she received Dilaudid. It is felt to be related to dexamethasone. D/C dexamethasone from subsequent treatments. I also asked patient not to take oral dexamethasone. 2. Profound neutropenia (at [facility]): decreased the dose of cycle 2 chemotherapy  3. Profound fatigue  4. Grade 1-2 nausea  5. Depressive symptoms: Slight improvement than before  Monitoring closely for toxicities Return to clinic in 2 weeks for cycle 1/12 Abraxane

## 2016-05-17 NOTE — Progress Notes (Signed)
Patient Care Team: [name], MD as PCP - General (Internal Medicine)  DIAGNOSIS: Breast cancer of upper-outer quadrant of left female breast [facility])   Staging form: Breast, AJCC 7th Edition     Clinical stage from 03/28/2016: Stage IIB (T2, N1, M0) - Unsigned   SUMMARY OF ONCOLOGIC HISTORY:   Breast cancer of upper-outer quadrant of left female breast ([place])   03/02/2016 Mammogram Hypoechoic masses in the upper outer quadrant left breast 2 adjacent irregular masses 2.5-1.4 x 1.2 cm and the other 2.7 x 1.4 x 2.3 cm total span 6.4 cm, enlarged multiple axillary lymph nodes measuring 1.7 cm, T2 N1/N2 stage IIb vs IIIa   03/19/2016 Initial Diagnosis Left breast biopsy 12:30 position: Invasive ductal carcinoma with DCIS, 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 She has profound fatigue related to chemotherapy. She does not have any nausea or vomiting. She has been eating fairly well. She does have some symptoms of depression. These have been slightly better than before. [birth year].  REVIEW OF SYSTEMS:   Constitutional: Denies fevers, chills or abnormal weight loss, profound fatigue Eyes: Denies blurriness of vision Ears, nose, mouth, throat, and face: Denies mucositis or sore throat Respiratory: Denies cough, dyspnea or wheezes Cardiovascular: Denies palpitation, chest discomfort Gastrointestinal:  Denies nausea, heartburn or change in bowel habits Skin: Denies abnormal skin rashes Lymphatics: Denies new lymphadenopathy or easy  bruising Neurological:Denies numbness, tingling or new weaknesses Behavioral/Psych: Mood is stable, no new changes  Extremities: No lower extremity edema  All other systems were reviewed with the patient and are negative.  I have reviewed the past medical history, past surgical history, social history and family history with the patient and they are unchanged from previous note.  ALLERGIES:  has No Known Allergies.  MEDICATIONS:  Current Outpatient Prescriptions  Medication Sig Dispense Refill  . Atorvastatin Calcium (INVESTIGATIONAL ATORVASTATIN/PLACEBO) 40 MG tablet [facility] [address] Take 1 tablet by mouth daily. Take 2 doses (these doses must be 12 hours apart) prior to first chemotherapy treatment. Then take 1 tablet daily by mouth with or without food. 180 tablet 0  . buPROPion (WELLBUTRIN XL) 150 MG 24 hr tablet Take 150 mg by mouth daily.  4  . citalopram (CELEXA) 40 MG tablet   3  . clonazePAM (KLONOPIN) 1 MG tablet Take 0.5 mg by mouth at bedtime.     . lidocaine-prilocaine (EMLA) cream Apply to affected area once 30 g 3  . loratadine (CLARITIN) 10 MG tablet Take 10 mg by mouth daily.    Marland Kitchen LORazepam (ATIVAN) 0.5 MG tablet Take 0.5 mg by mouth daily as needed. Reported on 05/14/2016  0  . nystatin-triamcinolone ointment (MYCOLOG) Apply 1 application topically 2 (two) times daily. 60 g 3  . ondansetron (ZOFRAN) 8 MG tablet Take 1 tablet (8 mg total) by mouth 2 (two) times daily as needed. Start on the third day after chemotherapy. 30 tablet 1  . pramipexole (MIRAPEX) 0.5 MG tablet Take 0.5 mg by mouth at bedtime.    . prochlorperazine (COMPAZINE) 10 MG tablet  Take 1 tablet (10 mg total) by mouth every 6 (six) hours as needed (Nausea or vomiting). 30 tablet 1  . RESTASIS 0.05 % ophthalmic emulsion Place 1 drop into both eyes 2 (two) times daily as needed. For dry eyes.  4  . traMADol (ULTRAM) 50 MG tablet Take 1 tablet (50 mg total) by mouth every 6 (six) hours as needed. 20 tablet  0  . zolpidem (AMBIEN) 5 MG tablet Take 5 mg by mouth at bedtime.     No current facility-administered medications for this visit.    PHYSICAL EXAMINATION: ECOG PERFORMANCE STATUS: 1 - Symptomatic but completely ambulatory  Filed Vitals:   05/17/16 1131  BP: 114/60  Pulse: 78  Temp: 97.6 F (36.4 C)  Resp: 18   Filed Weights   05/17/16 1131  Weight: 196 lb 14.4 oz (89.313 kg)    GENERAL:alert, no distress and comfortable SKIN: skin color, texture, turgor are normal, no rashes or significant lesions EYES: normal, Conjunctiva are pink and non-injected, sclera clear OROPHARYNX:no exudate, no erythema and lips, buccal mucosa, and tongue normal  NECK: supple, thyroid normal size, non-tender, without nodularity LYMPH:  no palpable lymphadenopathy in the cervical, axillary or inguinal LUNGS: clear to auscultation and percussion with normal breathing effort HEART: regular rate & rhythm and no murmurs and no lower extremity edema ABDOMEN:abdomen soft, non-tender and normal bowel sounds MUSCULOSKELETAL:no cyanosis of digits and no clubbing  NEURO: alert & oriented x 3 with fluent speech, no focal motor/sensory deficits EXTREMITIES: No lower extremity edema   LABORATORY DATA:  I have reviewed the data as listed   Chemistry      Component Value Date/Time   NA 141 05/03/2016 1006   NA 141 04/07/2016 0345   K 4.8 05/03/2016 1006   K 4.9 04/07/2016 0345   CL 108 04/07/2016 0345   CO2 28 05/03/2016 1006   CO2 25 04/07/2016 0345   BUN 12.8 05/03/2016 1006   BUN 25* 04/07/2016 0345   CREATININE 1.0 05/03/2016 1006   CREATININE 1.02* 04/07/2016 0345      Component Value Date/Time   CALCIUM 9.9 05/03/2016 1006   CALCIUM 9.5 04/07/2016 0345   ALKPHOS 100 05/03/2016 1006   ALKPHOS 92 10/06/2015 0616   AST 14 05/03/2016 1006   AST 61* 10/06/2015 0616   ALT 13 05/03/2016 1006   ALT 188* 10/06/2015 0616   BILITOT <0.30 05/03/2016 1006   BILITOT 0.5 10/06/2015 0616       Lab  Results  Component Value Date   WBC 10.7* 05/17/2016   HGB 11.4* 05/17/2016   HCT 34.8 05/17/2016   MCV 88.7 05/17/2016   PLT 240 05/17/2016   NEUTROABS 8.6* 05/17/2016     ASSESSMENT & PLAN:  Breast cancer of upper-outer quadrant of left female breast ([place]) Mammogram 03/02/2016: Hypoechoic masses in the upper outer quadrant left breast 2 adjacent irregular masses 2.5-1.4 x 1.2 cm and the other 2.7 x 1.4 x 2.3 cm total span 6.4 cm, enlarged multiple axillary lymph nodes measuring 1.7 cm, T2 N1/N2 stage IIb vs IIIa Left breast biopsy 03/19/2016 12:30 position: Invasive ductal carcinoma with DCIS, 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 Neoadjuvant chemotherapy with Adriamycin and Cytoxan dose dense 4 followed by Abraxane weekly 12 2. Followed by breast conserving surgery with sentinel lymph node study vs targeted axillary dissection 3. Followed by adjuvant radiation therapy ------------------------------------------------------------------------------------------------------------------------------------ PREVENT trial:  [id number]: Patient is enrolled in this trial atorvastatin versus placebo  No side effects related to the study drug. Current treatment: Today cycle 4 day 1 dose dense Adriamycin and Cytoxan  Chemotherapy toxicities: 1. Emergency room visit for severe restless leg syndrome resolved after she received Dilaudid. It is felt to be related to dexamethasone. D/C dexamethasone from subsequent treatments. I also asked patient not to take oral dexamethasone. 2. Profound neutropenia (at [facility]): decreased the dose of cycle 2 chemotherapy  3. Profound fatigue  4. Grade 1-2 nausea  5. Depressive symptoms: Slight improvement than before 6. Anemia grade 1-2 chemotherapy  Monitoring closely for toxicities Return to clinic in 2 weeks for cycle 1 Abraxane I will see the  patient in 3 weeks for cycle 2/12 Abraxane   No orders of the defined types were placed in this encounter.   The patient has a good understanding of the overall plan. she agrees with it. she will call with any problems that may develop before the next visit here.   Rulon Eisenmenger, MD 05/17/2016

## 2016-05-17 NOTE — Progress Notes (Signed)
HPI: Ms. Treloar was previously seen in the [place] clinic due to a personal and family  history of cancer and concerns regarding a hereditary predisposition to cancer. Please refer to our prior cancer genetics clinic note for more information regarding Ms. Burridge's medical, social and family histories, and our assessment and recommendations, at the time. Ms. Gebel recent genetic test results were disclosed to her, as were recommendations warranted by these results. These results and recommendations are discussed in more detail below.  FAMILY HISTORY:  We obtained a detailed, 4-generation family history.  Significant diagnoses are listed below: Family History  Problem Relation Age of Onset  . Anesthesia problems Neg Hx   . Heart disease Mother   . Pulmonary fibrosis Mother   . Hypertension Mother   . Cancer Father 80    astocytoma  . Lymphoma Maternal Aunt     The patient has two children who are cancer free. She is estranged from one or more of her siblings. She has one sister and two brothers who are reportedly cancer free. Her father died at 24-63 from an astrocytoma. Her mother passed away at 11 from pulmonary fibrosis. Her father was an only child. His parents died of old age. Her mother had three sisters and two brothers. One sister died from [place]. One maternal cousin had breast cancer in her 6s. There is a reported great, great aunt who had breast cancer. Patient's maternal ancestors are of [place] descent, and paternal ancestors are of unknown descent. There is no reported Ashkenazi Jewish ancestry. There is no known consanguinity.  GENETIC TEST RESULTS: At the time of Ms. Marcoe's visit, we recommended she pursue genetic testing of the Breast/Ovarian cancer gene panel. The Breast/Ovarian gene panel offered by [facility] includes sequencing and rearrangement analysis for the following 20 genes:  ATM, BARD1, BRCA1, BRCA2, BRIP1, CDH1, CHEK2, EPCAM, FANCC,  MLH1, MSH2, MSH6, NBN, PALB2, PMS2, PTEN, RAD51C, RAD51D, TP53, and XRCC2.   The report date is May 15, 2016.  Genetic testing was normal, and did not reveal a deleterious mutation in these genes. The test report has been scanned into EPIC and is located under the Molecular Pathology section of the Results Review tab.   We discussed with Ms. Chaddock that since the current genetic testing is not perfect, it is possible there may be a gene mutation in one of these genes that current testing cannot detect, but that chance is small. We also discussed, that it is possible that another gene that has not yet been discovered, or that we have not yet tested, is responsible for the cancer diagnoses in the family, and it is, therefore, important to remain in touch with cancer genetics in the future so that we can continue to offer Ms. Monrreal the most up to date genetic testing.   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 This is a variant in the promoter region. At this time, it is unknown if this variant is associated with increased cancer risk or if this is a normal finding, but most variants such as this get reclassified to being inconsequential. It should not be used to make medical management decisions. With time, we suspect the lab will determine the significance of this variant, if any. If we do learn more about it, we will try to contact Ms. Hirschmann to discuss it further. However, it is important to stay in touch with [place] periodically and keep the address and  phone number up to date.   CANCER SCREENING RECOMMENDATIONS: This result is reassuring and indicates that Ms. Beil likely does not have an increased risk for a future cancer due to a mutation in one of these genes. This normal test also suggests that Ms. Jayne's cancer was most likely not due to an inherited predisposition associated with one of these genes.  Most cancers happen by chance and this  negative test suggests that her cancer falls into this category.  We, therefore, recommended she continue to follow the cancer management and screening guidelines provided by her oncology and primary healthcare provider.   RECOMMENDATIONS FOR FAMILY MEMBERS: Women in this family might be at some increased risk of developing cancer, over the general population risk, simply due to the family history of cancer. We recommended women in this family have a yearly mammogram beginning at age 26, or 26 years younger than the earliest onset of cancer, an an annual clinical breast exam, and perform monthly breast self-exams. Women in this family should also have a gynecological exam as recommended by their primary provider. All family members should have a colonoscopy by age 54.  FOLLOW-UP: Lastly, we discussed with Ms. Martelle that cancer genetics is a rapidly advancing field and it is possible that new genetic tests will be appropriate for her and/or her family members in the future. We encouraged her to remain in contact with cancer genetics on an annual basis so we can update her personal and family histories and let her know of advances in cancer genetics that may benefit this family.   Our contact number was provided. Ms. Zipp questions were answered to her satisfaction, and she knows she is welcome to call us at anytime with additional questions or concerns.   Roma Kayser, MS, [facility] Certified Genetic Counselor [email or web address]

## 2016-05-17 NOTE — Patient Instructions (Signed)
[facility] Discharge Instructions for Patients Receiving Chemotherapy  Today you received the following chemotherapy agents adriamycin/cytoxan  To help prevent nausea and vomiting after your treatment, we encourage you to take your nausea medication as directed  If you develop nausea and vomiting that is not controlled by your nausea medication, call the clinic.   BELOW ARE SYMPTOMS THAT SHOULD BE REPORTED IMMEDIATELY:  *FEVER GREATER THAN 100.5 F  *CHILLS WITH OR WITHOUT FEVER  NAUSEA AND VOMITING THAT IS NOT CONTROLLED WITH YOUR NAUSEA MEDICATION  *UNUSUAL SHORTNESS OF BREATH  *UNUSUAL BRUISING OR BLEEDING  TENDERNESS IN MOUTH AND THROAT WITH OR WITHOUT PRESENCE OF ULCERS  *URINARY PROBLEMS  *BOWEL PROBLEMS  UNUSUAL RASH Items with * indicate a potential emergency and should be followed up as soon as possible.  Feel free to call the clinic you have any questions or concerns. The clinic phone number is [phone].  

## 2016-05-18 MED FILL — clonazePAM 1 MG TABS: 1 | 90 days supply | Qty: 90 | Fill #0

## 2016-05-22 MED FILL — PROCHLORPERAZINE 10 MG TAB: 10 | 7 days supply | Qty: 30 | Fill #1

## 2016-05-24 ENCOUNTER — Other Ambulatory Visit: Payer: 59

## 2016-05-24 ENCOUNTER — Ambulatory Visit: Payer: 59 | Admitting: Hematology and Oncology

## 2016-05-30 ENCOUNTER — Other Ambulatory Visit: Payer: Self-pay | Admitting: Hematology and Oncology

## 2016-05-31 ENCOUNTER — Other Ambulatory Visit: Payer: Self-pay | Admitting: *Deleted

## 2016-05-31 ENCOUNTER — Other Ambulatory Visit (HOSPITAL_BASED_OUTPATIENT_CLINIC_OR_DEPARTMENT_OTHER): Payer: 59

## 2016-05-31 ENCOUNTER — Encounter: Payer: Self-pay | Admitting: *Deleted

## 2016-05-31 ENCOUNTER — Ambulatory Visit (HOSPITAL_BASED_OUTPATIENT_CLINIC_OR_DEPARTMENT_OTHER): Payer: 59

## 2016-05-31 VITALS — BP 120/63 | HR 72 | Temp 98.7°F | Resp 17

## 2016-05-31 DIAGNOSIS — Z5111 Encounter for antineoplastic chemotherapy: Secondary | ICD-10-CM

## 2016-05-31 DIAGNOSIS — C50412 Malignant neoplasm of upper-outer quadrant of left female breast: Secondary | ICD-10-CM

## 2016-05-31 LAB — COMPREHENSIVE METABOLIC PANEL
ALT: 10 U/L (ref 0–55)
AST: 12 U/L (ref 5–34)
Albumin: 3.8 g/dL (ref 3.5–5.0)
Alkaline Phosphatase: 97 U/L (ref 40–150)
Anion Gap: 9 mEq/L (ref 3–11)
BUN: 8.3 mg/dL (ref 7.0–26.0)
CO2: 26 mEq/L (ref 22–29)
Calcium: 9.5 mg/dL (ref 8.4–10.4)
Chloride: 106 mEq/L (ref 98–109)
Creatinine: 0.8 mg/dL (ref 0.6–1.1)
EGFR: 76 mL/min/{1.73_m2} — ABNORMAL LOW (ref >90)
Glucose: 116 mg/dl (ref 70–140)
Potassium: 4.4 mEq/L (ref 3.5–5.1)
Sodium: 141 mEq/L (ref 136–145)
Total Bilirubin: <0.3 mg/dL (ref 0.20–1.20)
Total Protein: 7.1 g/dL (ref 6.4–8.3)

## 2016-05-31 LAB — CBC WITH DIFFERENTIAL/PLATELET
BASO%: 0.6 % (ref 0.0–2.0)
Basophils Absolute: 0.1 10*3/uL (ref 0.0–0.1)
EOS%: 0.9 % (ref 0.0–7.0)
Eosinophils Absolute: 0.2 10*3/uL (ref 0.0–0.5)
HCT: 32.4 % — ABNORMAL LOW (ref 34.8–46.6)
HGB: 10.5 g/dL — ABNORMAL LOW (ref 11.6–15.9)
LYMPH%: 5.7 % — ABNORMAL LOW (ref 14.0–49.7)
MCH: 29.2 pg (ref 25.1–34.0)
MCHC: 32.4 g/dL (ref 31.5–36.0)
MCV: 90 fL (ref 79.5–101.0)
MONO#: 1.1 10*3/uL — ABNORMAL HIGH (ref 0.1–0.9)
MONO%: 6.5 % (ref 0.0–14.0)
NEUT#: 14.6 10*3/uL — ABNORMAL HIGH (ref 1.5–6.5)
NEUT%: 86.3 % — ABNORMAL HIGH (ref 38.4–76.8)
Platelets: 146 10*3/uL (ref 145–400)
RBC: 3.6 10*6/uL — ABNORMAL LOW (ref 3.70–5.45)
RDW: 15.8 % — ABNORMAL HIGH (ref 11.2–14.5)
WBC: 16.9 10*3/uL — ABNORMAL HIGH (ref 3.9–10.3)
lymph#: 1 10*3/uL (ref 0.9–3.3)

## 2016-05-31 MED ORDER — LORAZEPAM 2 MG/ML IJ SOLN
INTRAMUSCULAR | Status: AC
  Filled 2016-05-31: qty 1

## 2016-05-31 MED ORDER — PALONOSETRON HCL INJECTION 0.25 MG/5ML
0.2500 mg | Freq: Once | INTRAVENOUS | Status: AC
  Administered 2016-05-31: 0.25 mg via INTRAVENOUS

## 2016-05-31 MED ORDER — PROCHLORPERAZINE MALEATE 10 MG PO TABS
ORAL_TABLET | ORAL | Status: AC
  Filled 2016-05-31: qty 1

## 2016-05-31 MED ORDER — SODIUM CHLORIDE 0.9 % IV SOLN
Freq: Once | INTRAVENOUS | Status: AC
  Administered 2016-05-31: 13:00:00 via INTRAVENOUS

## 2016-05-31 MED ORDER — HEPARIN SOD (PORK) LOCK FLUSH 100 UNIT/ML IV SOLN
500.0000 [IU] | Freq: Once | INTRAVENOUS | Status: AC | PRN
  Administered 2016-05-31: 500 [IU]
  Filled 2016-05-31: qty 5

## 2016-05-31 MED ORDER — SODIUM CHLORIDE 0.9% FLUSH
10.0000 mL | INTRAVENOUS | Status: DC | PRN
  Administered 2016-05-31: 10 mL
  Filled 2016-05-31: qty 10

## 2016-05-31 MED ORDER — PALONOSETRON HCL INJECTION 0.25 MG/5ML
INTRAVENOUS | Status: AC
  Filled 2016-05-31: qty 5

## 2016-05-31 MED ORDER — LORAZEPAM 2 MG/ML IJ SOLN
0.5000 mg | Freq: Once | INTRAMUSCULAR | Status: AC
Start: 2016-05-31 — End: 2016-05-31
  Administered 2016-05-31: 0.5 mg via INTRAVENOUS

## 2016-05-31 MED ORDER — PACLITAXEL PROTEIN-BOUND CHEMO INJECTION 100 MG
80.0000 mg/m2 | Freq: Once | INTRAVENOUS | Status: AC
  Administered 2016-05-31: 175 mg via INTRAVENOUS
  Filled 2016-05-31: qty 35

## 2016-05-31 NOTE — Progress Notes (Signed)
Pt here today very tearful, when asked why she is crying pt replied"  It's just my emotional rollercoaster, I'm okay. Please dont ask me why I'm crying" Pt c/o nausea. Call to MD for VO of ativan 0.5mg  IV as pt is very emotional and has some anxiety about today abraxane. MD gave VO for Aloxi 0.25 for nausea. Compazine held.

## 2016-06-07 ENCOUNTER — Ambulatory Visit (HOSPITAL_BASED_OUTPATIENT_CLINIC_OR_DEPARTMENT_OTHER): Payer: 59 | Admitting: Hematology and Oncology

## 2016-06-07 ENCOUNTER — Ambulatory Visit (HOSPITAL_BASED_OUTPATIENT_CLINIC_OR_DEPARTMENT_OTHER): Payer: 59

## 2016-06-07 ENCOUNTER — Encounter: Payer: Self-pay | Admitting: Hematology and Oncology

## 2016-06-07 ENCOUNTER — Other Ambulatory Visit (HOSPITAL_BASED_OUTPATIENT_CLINIC_OR_DEPARTMENT_OTHER): Payer: 59

## 2016-06-07 VITALS — BP 122/65 | HR 78 | Temp 98.3°F | Resp 18 | Ht 64.0 in | Wt 194.4 lb

## 2016-06-07 DIAGNOSIS — C50412 Malignant neoplasm of upper-outer quadrant of left female breast: Secondary | ICD-10-CM

## 2016-06-07 DIAGNOSIS — C773 Secondary and unspecified malignant neoplasm of axilla and upper limb lymph nodes: Secondary | ICD-10-CM

## 2016-06-07 DIAGNOSIS — D701 Agranulocytosis secondary to cancer chemotherapy: Secondary | ICD-10-CM | POA: Diagnosis not present

## 2016-06-07 DIAGNOSIS — D6481 Anemia due to antineoplastic chemotherapy: Secondary | ICD-10-CM

## 2016-06-07 DIAGNOSIS — R530 Neoplastic (malignant) related fatigue: Secondary | ICD-10-CM

## 2016-06-07 DIAGNOSIS — G4700 Insomnia, unspecified: Secondary | ICD-10-CM

## 2016-06-07 DIAGNOSIS — Z5111 Encounter for antineoplastic chemotherapy: Secondary | ICD-10-CM | POA: Diagnosis not present

## 2016-06-07 LAB — CBC WITH DIFFERENTIAL/PLATELET
BASO%: 1.4 % (ref 0.0–2.0)
Basophils Absolute: 0.1 10*3/uL (ref 0.0–0.1)
EOS%: 1.4 % (ref 0.0–7.0)
Eosinophils Absolute: 0.1 10*3/uL (ref 0.0–0.5)
HCT: 28.9 % — ABNORMAL LOW (ref 34.8–46.6)
HGB: 9.4 g/dL — ABNORMAL LOW (ref 11.6–15.9)
LYMPH%: 21.1 % (ref 14.0–49.7)
MCH: 29.5 pg (ref 25.1–34.0)
MCHC: 32.5 g/dL (ref 31.5–36.0)
MCV: 90.6 fL (ref 79.5–101.0)
MONO#: 0.4 10*3/uL (ref 0.1–0.9)
MONO%: 11.4 % (ref 0.0–14.0)
NEUT#: 2.3 10*3/uL (ref 1.5–6.5)
NEUT%: 64.7 % (ref 38.4–76.8)
Platelets: 282 10*3/uL (ref 145–400)
RBC: 3.19 10*6/uL — ABNORMAL LOW (ref 3.70–5.45)
RDW: 16.9 % — ABNORMAL HIGH (ref 11.2–14.5)
WBC: 3.5 10*3/uL — ABNORMAL LOW (ref 3.9–10.3)
lymph#: 0.7 10*3/uL — ABNORMAL LOW (ref 0.9–3.3)

## 2016-06-07 LAB — COMPREHENSIVE METABOLIC PANEL
ALT: 16 U/L (ref 0–55)
AST: 13 U/L (ref 5–34)
Albumin: 3.8 g/dL (ref 3.5–5.0)
Alkaline Phosphatase: 74 U/L (ref 40–150)
Anion Gap: 8 mEq/L (ref 3–11)
BUN: 8.5 mg/dL (ref 7.0–26.0)
CO2: 27 mEq/L (ref 22–29)
Calcium: 9.6 mg/dL (ref 8.4–10.4)
Chloride: 106 mEq/L (ref 98–109)
Creatinine: 0.8 mg/dL (ref 0.6–1.1)
EGFR: 81 mL/min/{1.73_m2} — ABNORMAL LOW (ref >90)
Glucose: 101 mg/dl (ref 70–140)
Potassium: 4.3 mEq/L (ref 3.5–5.1)
Sodium: 141 mEq/L (ref 136–145)
Total Bilirubin: <0.3 mg/dL (ref 0.20–1.20)
Total Protein: 7 g/dL (ref 6.4–8.3)

## 2016-06-07 MED ORDER — SODIUM CHLORIDE 0.9 % IV SOLN
Freq: Once | INTRAVENOUS | Status: AC
  Administered 2016-06-07: 13:00:00 via INTRAVENOUS

## 2016-06-07 MED ORDER — HEPARIN SOD (PORK) LOCK FLUSH 100 UNIT/ML IV SOLN
500.0000 [IU] | Freq: Once | INTRAVENOUS | Status: AC | PRN
  Administered 2016-06-07: 500 [IU]
  Filled 2016-06-07: qty 5

## 2016-06-07 MED ORDER — PROCHLORPERAZINE MALEATE 10 MG PO TABS
10.0000 mg | ORAL_TABLET | Freq: Once | ORAL | Status: AC
  Administered 2016-06-07: 10 mg via ORAL

## 2016-06-07 MED ORDER — SODIUM CHLORIDE 0.9% FLUSH
10.0000 mL | INTRAVENOUS | Status: DC | PRN
  Administered 2016-06-07: 10 mL
  Filled 2016-06-07: qty 10

## 2016-06-07 MED ORDER — PACLITAXEL PROTEIN-BOUND CHEMO INJECTION 100 MG
80.0000 mg/m2 | Freq: Once | INTRAVENOUS | Status: AC
  Administered 2016-06-07: 175 mg via INTRAVENOUS
  Filled 2016-06-07: qty 35

## 2016-06-07 MED ORDER — PROCHLORPERAZINE MALEATE 10 MG PO TABS
ORAL_TABLET | ORAL | Status: AC
  Filled 2016-06-07: qty 1

## 2016-06-07 NOTE — Progress Notes (Signed)
Patient Care Team: [name], MD as PCP - General (Internal Medicine)  DIAGNOSIS: Breast cancer of upper-outer quadrant of left female breast [facility])   Staging form: Breast, AJCC 7th Edition     Clinical stage from 03/28/2016: Stage IIB (T2, N1, M0) - Unsigned   SUMMARY OF ONCOLOGIC HISTORY:   Breast cancer of upper-outer quadrant of left female breast ([place])   03/02/2016 Mammogram Hypoechoic masses in the upper outer quadrant left breast 2 adjacent irregular masses 2.5-1.4 x 1.2 cm and the other 2.7 x 1.4 x 2.3 cm total span 6.4 cm, enlarged multiple axillary lymph nodes measuring 1.7 cm, T2 N1/N2 stage IIb vs IIIa   03/19/2016 Initial Diagnosis Left breast biopsy 12:30 position: Invasive ductal carcinoma with DCIS, 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 She is currently receiving Abraxane. She tolerated Abraxane first cycle extremely well. She complained of right upper quadrant abdominal discomfort as well as left breast discomfort. She continues to feel fatigued. Denies any nausea or vomiting. Her major complaint is a facility with sleeping. She has been taking her husband's Vicodin for sleep.  REVIEW OF SYSTEMS:   Constitutional: Denies fevers, chills or abnormal weight loss Eyes: Denies blurriness of vision Ears, nose, mouth, throat, and face: Denies mucositis or sore throat Respiratory: Denies cough, dyspnea or wheezes Cardiovascular: Denies palpitation, chest discomfort Gastrointestinal:  Denies nausea, heartburn or change in bowel habits Skin: Denies abnormal skin rashes Lymphatics: Denies new lymphadenopathy or  easy bruising Neurological:Denies numbness, tingling or new weaknesses Behavioral/Psych: Decreased sleep , restless legs Extremities: No lower extremity edema  All other systems were reviewed with the patient and are negative.  I have reviewed the past medical history, past surgical history, social history and family history with the patient and they are unchanged from previous note.  ALLERGIES:  has No Known Allergies.  MEDICATIONS:  Current Outpatient Prescriptions  Medication Sig Dispense Refill  . Atorvastatin Calcium (INVESTIGATIONAL ATORVASTATIN/PLACEBO) 40 MG tablet [facility] [id number] Take 1 tablet by mouth daily. Take 2 doses (these doses must be 12 hours apart) prior to first chemotherapy treatment. Then take 1 tablet daily by mouth with or without food. 180 tablet 0  . buPROPion (WELLBUTRIN XL) 150 MG 24 hr tablet Take 150 mg by mouth daily.  4  . citalopram (CELEXA) 40 MG tablet   3  . clonazePAM (KLONOPIN) 1 MG tablet Take 0.5 mg by mouth at bedtime.     Marland Kitchen loratadine (CLARITIN) 10 MG tablet Take 10 mg by mouth daily.    Marland Kitchen LORazepam (ATIVAN) 0.5 MG tablet Take 0.5 mg by mouth daily as needed. Reported on 05/14/2016  0  . nystatin-triamcinolone ointment (MYCOLOG) Apply 1 application topically 2 (two) times daily. 60 g 3  . pramipexole (MIRAPEX) 0.5 MG tablet Take 0.5 mg by mouth at bedtime.    . RESTASIS 0.05 % ophthalmic emulsion Place 1 drop into both eyes 2 (two) times daily as needed. For dry eyes.  4  . traMADol (ULTRAM) 50 MG tablet Take 1 tablet (50 mg total) by mouth every 6 (six) hours as needed. 20 tablet 0  . zolpidem (AMBIEN) 5 MG tablet  Take 5 mg by mouth at bedtime.     No current facility-administered medications for this visit.    PHYSICAL EXAMINATION: ECOG PERFORMANCE STATUS: 1 - Symptomatic but completely ambulatory  Filed Vitals:   06/07/16 1107  BP: 122/65  Pulse: 78  Temp: 98.3 F (36.8 C)  Resp: 18   Filed Weights   06/07/16 1107  Weight: 194  lb 6.4 oz (88.179 kg)    GENERAL:alert, no distress and comfortable SKIN: skin color, texture, turgor are normal, no rashes or significant lesions EYES: normal, Conjunctiva are pink and non-injected, sclera clear OROPHARYNX:no exudate, no erythema and lips, buccal mucosa, and tongue normal  NECK: supple, thyroid normal size, non-tender, without nodularity LYMPH:  no palpable lymphadenopathy in the cervical, axillary or inguinal LUNGS: clear to auscultation and percussion with normal breathing effort HEART: regular rate & rhythm and no murmurs and no lower extremity edema ABDOMEN:abdomen soft, non-tender and normal bowel sounds MUSCULOSKELETAL:no cyanosis of digits and no clubbing  NEURO: alert & oriented x 3 with fluent speech, no focal motor/sensory deficits EXTREMITIES: No lower extremity edema  LABORATORY DATA:  I have reviewed the data as listed   Chemistry      Component Value Date/Time   NA 141 06/07/2016 1051   NA 141 04/07/2016 0345   K 4.3 06/07/2016 1051   K 4.9 04/07/2016 0345   CL 108 04/07/2016 0345   CO2 27 06/07/2016 1051   CO2 25 04/07/2016 0345   BUN 8.5 06/07/2016 1051   BUN 25* 04/07/2016 0345   CREATININE 0.8 06/07/2016 1051   CREATININE 1.02* 04/07/2016 0345      Component Value Date/Time   CALCIUM 9.6 06/07/2016 1051   CALCIUM 9.5 04/07/2016 0345   ALKPHOS 74 06/07/2016 1051   ALKPHOS 92 10/06/2015 0616   AST 13 06/07/2016 1051   AST 61* 10/06/2015 0616   ALT 16 06/07/2016 1051   ALT 188* 10/06/2015 0616   BILITOT <0.30 06/07/2016 1051   BILITOT 0.5 10/06/2015 0616       Lab Results  Component Value Date   WBC 3.5* 06/07/2016   HGB 9.4* 06/07/2016   HCT 28.9* 06/07/2016   MCV 90.6 06/07/2016   PLT 282 06/07/2016   NEUTROABS 2.3 06/07/2016     ASSESSMENT & PLAN:  Breast cancer of upper-outer quadrant of left female breast ([place]) Mammogram 03/02/2016: Hypoechoic masses in the upper outer quadrant left breast 2 adjacent irregular masses  2.5-1.4 x 1.2 cm and the other 2.7 x 1.4 x 2.3 cm total span 6.4 cm, enlarged multiple axillary lymph nodes measuring 1.7 cm, T2 N1/N2 stage IIb vs IIIa Left breast biopsy 03/19/2016 12:30 position: Invasive ductal carcinoma with DCIS, 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 Neoadjuvant chemotherapy with Adriamycin and Cytoxan dose dense 4 followed by Abraxane weekly 12 2. Followed by breast conserving surgery with sentinel lymph node study vs targeted axillary dissection 3. Followed by adjuvant radiation therapy ------------------------------------------------------------------------------------------------------------------------------------ PREVENT trial: [id number]: Patient is enrolled in this trial atorvastatin versus placebo  No side effects related to the study drug. Current treatment: Completed 4 cycles of dose dense Adriamycin and Cytoxan; today is cycle 2/12 Abraxane  Chemotherapy toxicities: 1. Emergency room visit for severe restless leg syndrome resolved after she received Dilaudid. It is felt to be related to dexamethasone. D/C dexamethasone from subsequent treatments. I also asked patient not to take oral dexamethasone. 2.  Profound neutropenia (at [facility]): decreased the dose of cycle 2 chemotherapy  3. Profound fatigue  4. Grade 1-2 nausea  5. Depressive symptoms: Slight improvement than before 6. Anemia grade 1-2 chemotherapy 7. Insomnia: She currently takes Ambien, Ativan for restless legs as well as her husband's Vicodin. Instructed him not to take the Vicodin him further. She plans to take the tramadol that had previously prescribed for her. I worry that she is getting tolerant to benzodiazepines.  Monitoring closely for toxicities Return to clinic in 2 weeks for cycle 4 Abraxane    No orders of the defined types were placed in this encounter.   The patient has  a good understanding of the overall plan. she agrees with it. she will call with any problems that may develop before the next visit here.   Rulon Eisenmenger, MD 06/07/2016

## 2016-06-07 NOTE — Patient Instructions (Signed)
[facility] Discharge Instructions for Patients Receiving Chemotherapy  Today you received the following chemotherapy agents Abraxane To help prevent nausea and vomiting after your treatment, we encourage you to take your nausea medication as prescribed.   If you develop nausea and vomiting that is not controlled by your nausea medication, call the clinic.   BELOW ARE SYMPTOMS THAT SHOULD BE REPORTED IMMEDIATELY:  *FEVER GREATER THAN 100.5 F  *CHILLS WITH OR WITHOUT FEVER  NAUSEA AND VOMITING THAT IS NOT CONTROLLED WITH YOUR NAUSEA MEDICATION  *UNUSUAL SHORTNESS OF BREATH  *UNUSUAL BRUISING OR BLEEDING  TENDERNESS IN MOUTH AND THROAT WITH OR WITHOUT PRESENCE OF ULCERS  *URINARY PROBLEMS  *BOWEL PROBLEMS  UNUSUAL RASH Items with * indicate a potential emergency and should be followed up as soon as possible.  Feel free to call the clinic you have any questions or concerns. The clinic phone number is [phone].  Please show the CHEMO ALERT CARD at check-in to the Emergency Department and triage nurse.   

## 2016-06-07 NOTE — Assessment & Plan Note (Signed)
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 Neoadjuvant chemotherapy with Adriamycin and Cytoxan dose dense 4 followed by Abraxane weekly 12 2. Followed by breast conserving surgery with sentinel lymph node study vs targeted axillary dissection 3. Followed by adjuvant radiation therapy ------------------------------------------------------------------------------------------------------------------------------------ PREVENT trial: [id number]: Patient is enrolled in this trial atorvastatin versus placebo  No side effects related to the study drug. Current treatment: Completed 4 cycles of dose dense Adriamycin and Cytoxan; today is cycle 2/12 Abraxane  Chemotherapy toxicities: 1. Emergency room visit for severe restless leg syndrome resolved after she received Dilaudid. It is felt to be related to dexamethasone. D/C dexamethasone from subsequent treatments. I also asked patient not to take oral dexamethasone. 2. Profound neutropenia (at [facility]): decreased the dose of cycle 2 chemotherapy  3. Profound fatigue  4. Grade 1-2 nausea  5. Depressive symptoms: Slight improvement than before 6. Anemia grade 1-2 chemotherapy  Monitoring closely for toxicities Return to clinic in 2 weeks for cycle 4 Abraxane

## 2016-06-14 ENCOUNTER — Other Ambulatory Visit (HOSPITAL_BASED_OUTPATIENT_CLINIC_OR_DEPARTMENT_OTHER): Payer: 59

## 2016-06-14 ENCOUNTER — Encounter: Payer: Self-pay | Admitting: *Deleted

## 2016-06-14 ENCOUNTER — Ambulatory Visit (HOSPITAL_BASED_OUTPATIENT_CLINIC_OR_DEPARTMENT_OTHER): Payer: 59

## 2016-06-14 ENCOUNTER — Other Ambulatory Visit: Payer: Self-pay | Admitting: *Deleted

## 2016-06-14 VITALS — BP 130/51 | HR 71 | Temp 98.6°F | Resp 17

## 2016-06-14 DIAGNOSIS — C773 Secondary and unspecified malignant neoplasm of axilla and upper limb lymph nodes: Secondary | ICD-10-CM

## 2016-06-14 DIAGNOSIS — Z5112 Encounter for antineoplastic immunotherapy: Secondary | ICD-10-CM | POA: Diagnosis not present

## 2016-06-14 DIAGNOSIS — C50412 Malignant neoplasm of upper-outer quadrant of left female breast: Secondary | ICD-10-CM | POA: Diagnosis not present

## 2016-06-14 LAB — CBC WITH DIFFERENTIAL/PLATELET
BASO%: 2.7 % — ABNORMAL HIGH (ref 0.0–2.0)
Basophils Absolute: 0.1 10*3/uL (ref 0.0–0.1)
EOS%: 4.3 % (ref 0.0–7.0)
Eosinophils Absolute: 0.1 10*3/uL (ref 0.0–0.5)
HCT: 29.6 % — ABNORMAL LOW (ref 34.8–46.6)
HGB: 9.6 g/dL — ABNORMAL LOW (ref 11.6–15.9)
LYMPH%: 21.8 % (ref 14.0–49.7)
MCH: 29.8 pg (ref 25.1–34.0)
MCHC: 32.4 g/dL (ref 31.5–36.0)
MCV: 91.9 fL (ref 79.5–101.0)
MONO#: 0.3 10*3/uL (ref 0.1–0.9)
MONO%: 11.7 % (ref 0.0–14.0)
NEUT#: 1.7 10*3/uL (ref 1.5–6.5)
NEUT%: 59.5 % (ref 38.4–76.8)
Platelets: 269 10*3/uL (ref 145–400)
RBC: 3.22 10*6/uL — ABNORMAL LOW (ref 3.70–5.45)
RDW: 18.9 % — ABNORMAL HIGH (ref 11.2–14.5)
WBC: 2.8 10*3/uL — ABNORMAL LOW (ref 3.9–10.3)
lymph#: 0.6 10*3/uL — ABNORMAL LOW (ref 0.9–3.3)

## 2016-06-14 LAB — COMPREHENSIVE METABOLIC PANEL
ALT: 23 U/L (ref 0–55)
AST: 19 U/L (ref 5–34)
Albumin: 3.8 g/dL (ref 3.5–5.0)
Alkaline Phosphatase: 70 U/L (ref 40–150)
Anion Gap: 9 mEq/L (ref 3–11)
BUN: 9.1 mg/dL (ref 7.0–26.0)
CO2: 27 mEq/L (ref 22–29)
Calcium: 9.9 mg/dL (ref 8.4–10.4)
Chloride: 107 mEq/L (ref 98–109)
Creatinine: 0.8 mg/dL (ref 0.6–1.1)
EGFR: 75 mL/min/{1.73_m2} — ABNORMAL LOW (ref >90)
Glucose: 96 mg/dl (ref 70–140)
Potassium: 4.1 mEq/L (ref 3.5–5.1)
Sodium: 143 mEq/L (ref 136–145)
Total Bilirubin: 0.33 mg/dL (ref 0.20–1.20)
Total Protein: 7.2 g/dL (ref 6.4–8.3)

## 2016-06-14 MED ORDER — PROCHLORPERAZINE MALEATE 10 MG PO TABS
10.0000 mg | ORAL_TABLET | Freq: Once | ORAL | Status: AC
  Administered 2016-06-14: 10 mg via ORAL

## 2016-06-14 MED ORDER — PACLITAXEL PROTEIN-BOUND CHEMO INJECTION 100 MG
80.0000 mg/m2 | Freq: Once | INTRAVENOUS | Status: AC
  Administered 2016-06-14: 175 mg via INTRAVENOUS
  Filled 2016-06-14: qty 35

## 2016-06-14 MED ORDER — TRAMADOL HCL 50 MG PO TABS: 50.0000 mg | ORAL_TABLET | Freq: Four times a day (QID) | ORAL | Status: DC | PRN

## 2016-06-14 MED ORDER — PROCHLORPERAZINE MALEATE 10 MG PO TABS
ORAL_TABLET | ORAL | Status: AC
  Filled 2016-06-14: qty 1

## 2016-06-14 MED ORDER — SODIUM CHLORIDE 0.9 % IV SOLN
Freq: Once | INTRAVENOUS | Status: AC
  Administered 2016-06-14: 12:00:00 via INTRAVENOUS

## 2016-06-14 MED ORDER — SODIUM CHLORIDE 0.9% FLUSH
10.0000 mL | INTRAVENOUS | Status: DC | PRN
  Administered 2016-06-14: 10 mL
  Filled 2016-06-14: qty 10

## 2016-06-14 MED ORDER — HEPARIN SOD (PORK) LOCK FLUSH 100 UNIT/ML IV SOLN
500.0000 [IU] | Freq: Once | INTRAVENOUS | Status: AC | PRN
  Administered 2016-06-14: 500 [IU]
  Filled 2016-06-14: qty 5

## 2016-06-14 MED FILL — traMADol HCL 50 MG TABS: 50 | 7 days supply | Qty: 30 | Fill #0

## 2016-06-14 NOTE — Patient Instructions (Signed)
[facility] Discharge Instructions for Patients Receiving Chemotherapy  Today you received the following chemotherapy agents: Abraxane   To help prevent nausea and vomiting after your treatment, we encourage you to take your nausea medication as directed.    If you develop nausea and vomiting that is not controlled by your nausea medication, call the clinic.   BELOW ARE SYMPTOMS THAT SHOULD BE REPORTED IMMEDIATELY:  *FEVER GREATER THAN 100.5 F  *CHILLS WITH OR WITHOUT FEVER  NAUSEA AND VOMITING THAT IS NOT CONTROLLED WITH YOUR NAUSEA MEDICATION  *UNUSUAL SHORTNESS OF BREATH  *UNUSUAL BRUISING OR BLEEDING  TENDERNESS IN MOUTH AND THROAT WITH OR WITHOUT PRESENCE OF ULCERS  *URINARY PROBLEMS  *BOWEL PROBLEMS  UNUSUAL RASH Items with * indicate a potential emergency and should be followed up as soon as possible.  Feel free to call the clinic you have any questions or concerns. The clinic phone number is [phone].  Please show the CHEMO ALERT CARD at check-in to the Emergency Department and triage nurse.   

## 2016-06-15 MED FILL — CITALOPRAM HBR 40 MG TABLET: 40 | 90 days supply | Qty: 90 | Fill #2

## 2016-06-16 ENCOUNTER — Encounter (HOSPITAL_COMMUNITY): Payer: Self-pay

## 2016-06-21 ENCOUNTER — Encounter: Payer: Self-pay | Admitting: *Deleted

## 2016-06-21 ENCOUNTER — Encounter: Payer: Self-pay | Admitting: Hematology and Oncology

## 2016-06-21 ENCOUNTER — Ambulatory Visit (HOSPITAL_BASED_OUTPATIENT_CLINIC_OR_DEPARTMENT_OTHER): Payer: 59 | Admitting: Hematology and Oncology

## 2016-06-21 ENCOUNTER — Ambulatory Visit (HOSPITAL_BASED_OUTPATIENT_CLINIC_OR_DEPARTMENT_OTHER): Payer: 59

## 2016-06-21 ENCOUNTER — Other Ambulatory Visit (HOSPITAL_BASED_OUTPATIENT_CLINIC_OR_DEPARTMENT_OTHER): Payer: 59

## 2016-06-21 VITALS — BP 122/73 | HR 84 | Temp 98.4°F | Resp 18 | Wt 192.4 lb

## 2016-06-21 DIAGNOSIS — Z171 Estrogen receptor negative status [ER-]: Secondary | ICD-10-CM | POA: Diagnosis not present

## 2016-06-21 DIAGNOSIS — D701 Agranulocytosis secondary to cancer chemotherapy: Secondary | ICD-10-CM | POA: Diagnosis not present

## 2016-06-21 DIAGNOSIS — Z5111 Encounter for antineoplastic chemotherapy: Secondary | ICD-10-CM

## 2016-06-21 DIAGNOSIS — C773 Secondary and unspecified malignant neoplasm of axilla and upper limb lymph nodes: Secondary | ICD-10-CM

## 2016-06-21 DIAGNOSIS — C50412 Malignant neoplasm of upper-outer quadrant of left female breast: Secondary | ICD-10-CM

## 2016-06-21 LAB — CBC WITH DIFFERENTIAL/PLATELET
BASO%: 2.8 % — ABNORMAL HIGH (ref 0.0–2.0)
Basophils Absolute: 0.1 10*3/uL (ref 0.0–0.1)
EOS%: 8.8 % — ABNORMAL HIGH (ref 0.0–7.0)
Eosinophils Absolute: 0.2 10*3/uL (ref 0.0–0.5)
HCT: 29.9 % — ABNORMAL LOW (ref 34.8–46.6)
HGB: 9.7 g/dL — ABNORMAL LOW (ref 11.6–15.9)
LYMPH%: 30.9 % (ref 14.0–49.7)
MCH: 30.2 pg (ref 25.1–34.0)
MCHC: 32.4 g/dL (ref 31.5–36.0)
MCV: 93.1 fL (ref 79.5–101.0)
MONO#: 0.3 10*3/uL (ref 0.1–0.9)
MONO%: 11.2 % (ref 0.0–14.0)
NEUT#: 1.2 10*3/uL — ABNORMAL LOW (ref 1.5–6.5)
NEUT%: 46.3 % (ref 38.4–76.8)
Platelets: 245 10*3/uL (ref 145–400)
RBC: 3.21 10*6/uL — ABNORMAL LOW (ref 3.70–5.45)
RDW: 18.2 % — ABNORMAL HIGH (ref 11.2–14.5)
WBC: 2.5 10*3/uL — ABNORMAL LOW (ref 3.9–10.3)
lymph#: 0.8 10*3/uL — ABNORMAL LOW (ref 0.9–3.3)

## 2016-06-21 LAB — COMPREHENSIVE METABOLIC PANEL
ALT: 21 U/L (ref 0–55)
AST: 18 U/L (ref 5–34)
Albumin: 3.8 g/dL (ref 3.5–5.0)
Alkaline Phosphatase: 66 U/L (ref 40–150)
Anion Gap: 9 mEq/L (ref 3–11)
BUN: 10.9 mg/dL (ref 7.0–26.0)
CO2: 26 mEq/L (ref 22–29)
Calcium: 9.6 mg/dL (ref 8.4–10.4)
Chloride: 108 mEq/L (ref 98–109)
Creatinine: 0.9 mg/dL (ref 0.6–1.1)
EGFR: 73 mL/min/{1.73_m2} — ABNORMAL LOW (ref >90)
Glucose: 83 mg/dl (ref 70–140)
Potassium: 4.1 mEq/L (ref 3.5–5.1)
Sodium: 142 mEq/L (ref 136–145)
Total Bilirubin: 0.3 mg/dL (ref 0.20–1.20)
Total Protein: 7.1 g/dL (ref 6.4–8.3)

## 2016-06-21 MED ORDER — SODIUM CHLORIDE 0.9% FLUSH
10.0000 mL | INTRAVENOUS | Status: DC | PRN
  Administered 2016-06-21: 10 mL
  Filled 2016-06-21: qty 10

## 2016-06-21 MED ORDER — SODIUM CHLORIDE 0.9 % IV SOLN
Freq: Once | INTRAVENOUS | Status: AC
  Administered 2016-06-21: 12:00:00 via INTRAVENOUS

## 2016-06-21 MED ORDER — HEPARIN SOD (PORK) LOCK FLUSH 100 UNIT/ML IV SOLN
500.0000 [IU] | Freq: Once | INTRAVENOUS | Status: AC | PRN
  Administered 2016-06-21: 500 [IU]
  Filled 2016-06-21: qty 5

## 2016-06-21 MED ORDER — PACLITAXEL PROTEIN-BOUND CHEMO INJECTION 100 MG
65.0000 mg/m2 | Freq: Once | INTRAVENOUS | Status: AC
  Administered 2016-06-21: 125 mg via INTRAVENOUS
  Filled 2016-06-21: qty 25

## 2016-06-21 MED ORDER — PROCHLORPERAZINE MALEATE 10 MG PO TABS
10.0000 mg | ORAL_TABLET | Freq: Once | ORAL | Status: AC
  Administered 2016-06-21: 10 mg via ORAL

## 2016-06-21 MED ORDER — PROCHLORPERAZINE MALEATE 10 MG PO TABS
ORAL_TABLET | ORAL | Status: AC
  Filled 2016-06-21: qty 1

## 2016-06-21 NOTE — Progress Notes (Signed)
recd in my box  disab [employer]

## 2016-06-21 NOTE — Patient Instructions (Signed)
[facility] Discharge Instructions for Patients Receiving Chemotherapy  Today you received the following chemotherapy agents Abraxane To help prevent nausea and vomiting after your treatment, we encourage you to take your nausea medication as prescribed.   If you develop nausea and vomiting that is not controlled by your nausea medication, call the clinic.   BELOW ARE SYMPTOMS THAT SHOULD BE REPORTED IMMEDIATELY:  *FEVER GREATER THAN 100.5 F  *CHILLS WITH OR WITHOUT FEVER  NAUSEA AND VOMITING THAT IS NOT CONTROLLED WITH YOUR NAUSEA MEDICATION  *UNUSUAL SHORTNESS OF BREATH  *UNUSUAL BRUISING OR BLEEDING  TENDERNESS IN MOUTH AND THROAT WITH OR WITHOUT PRESENCE OF ULCERS  *URINARY PROBLEMS  *BOWEL PROBLEMS  UNUSUAL RASH Items with * indicate a potential emergency and should be followed up as soon as possible.  Feel free to call the clinic you have any questions or concerns. The clinic phone number is [phone].  Please show the CHEMO ALERT CARD at check-in to the Emergency Department and triage nurse.   

## 2016-06-21 NOTE — Progress Notes (Signed)
Patient Care Team: [name], MD as PCP - General (Internal Medicine)  DIAGNOSIS: Breast cancer of upper-outer quadrant of left female breast [name])   Staging form: Breast, AJCC 7th Edition     Clinical stage from 03/28/2016: Stage IIB (T2, N1, M0) - Unsigned   SUMMARY OF ONCOLOGIC HISTORY:   Breast cancer of upper-outer quadrant of left female breast ([place])   03/02/2016 Mammogram Hypoechoic masses in the upper outer quadrant left breast 2 adjacent irregular masses 2.5-1.4 x 1.2 cm and the other 2.7 x 1.4 x 2.3 cm total span 6.4 cm, enlarged multiple axillary lymph nodes measuring 1.7 cm, T2 N1/N2 stage IIb vs IIIa   03/19/2016 Initial Diagnosis Left breast biopsy 12:30 position: Invasive ductal carcinoma with DCIS, 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 She has been doing fairly well other than fatigue related to chemotherapy. She denies any nausea or vomiting.  REVIEW OF SYSTEMS:   Constitutional: Denies fevers, chills or abnormal weight loss Eyes: Denies blurriness of vision Ears, nose, mouth, throat, and face: Denies mucositis or sore throat Respiratory: Denies cough, dyspnea or wheezes Cardiovascular: Denies palpitation, chest discomfort Gastrointestinal:  Denies nausea, heartburn or change in bowel habits Skin: Denies abnormal skin rashes Lymphatics: Denies new lymphadenopathy or easy bruising Neurological:Denies numbness, tingling or new weaknesses Behavioral/Psych: Mood is stable, no new changes  Extremities: No  lower extremity edema Breast:  denies any pain or lumps or nodules in either breasts All other systems were reviewed with the patient and are negative.  I have reviewed the past medical history, past surgical history, social history and family history with the patient and they are unchanged from previous note.  ALLERGIES:  has No Known Allergies.  MEDICATIONS:  Current Outpatient Prescriptions  Medication Sig Dispense Refill  . Atorvastatin Calcium (INVESTIGATIONAL ATORVASTATIN/PLACEBO) 40 MG tablet [facility] [address] Take 1 tablet by mouth daily. Take 2 doses (these doses must be 12 hours apart) prior to first chemotherapy treatment. Then take 1 tablet daily by mouth with or without food. 180 tablet 0  . buPROPion (WELLBUTRIN XL) 150 MG 24 hr tablet Take 150 mg by mouth daily.  4  . cetirizine (ZYRTEC) 10 MG tablet Take 10 mg by mouth daily.    . citalopram (CELEXA) 40 MG tablet   3  . clonazePAM (KLONOPIN) 1 MG tablet Take 0.5 mg by mouth at bedtime.     Marland Kitchen loratadine (CLARITIN) 10 MG tablet Take 10 mg by mouth daily. Reported on 06/21/2016    . LORazepam (ATIVAN) 0.5 MG tablet Take 0.5 mg by mouth daily as needed. Reported on 05/14/2016  0  . nystatin-triamcinolone ointment (MYCOLOG) Apply 1 application topically 2 (two) times daily. 60 g 3  . pramipexole (MIRAPEX) 0.5 MG tablet Take 0.5 mg by mouth at bedtime.    . RESTASIS 0.05 % ophthalmic emulsion Place 1 drop into both eyes 2 (two) times daily as needed. For dry eyes.  4  . traMADol (ULTRAM) 50 MG tablet Take 1 tablet (50 mg total) by mouth every 6 (six) hours as  needed. 30 tablet 1  . zolpidem (AMBIEN) 5 MG tablet Take 5 mg by mouth at bedtime.     No current facility-administered medications for this visit.    PHYSICAL EXAMINATION: ECOG PERFORMANCE STATUS: 1 - Symptomatic but completely ambulatory  Filed Vitals:   06/21/16 1113  BP: 122/73  Pulse: 84  Temp: 98.4 F (36.9 C)  Resp: 18   Filed Weights   06/21/16 1113    Weight: 192 lb 6.4 oz (87.272 kg)    GENERAL:alert, no distress and comfortable SKIN: skin color, texture, turgor are normal, no rashes or significant lesions EYES: normal, Conjunctiva are pink and non-injected, sclera clear OROPHARYNX:no exudate, no erythema and lips, buccal mucosa, and tongue normal  NECK: supple, thyroid normal size, non-tender, without nodularity LYMPH:  no palpable lymphadenopathy in the cervical, axillary or inguinal LUNGS: clear to auscultation and percussion with normal breathing effort HEART: regular rate & rhythm and no murmurs and no lower extremity edema ABDOMEN:abdomen soft, non-tender and normal bowel sounds MUSCULOSKELETAL:no cyanosis of digits and no clubbing  NEURO: alert & oriented x 3 with fluent speech, no focal motor/sensory deficits EXTREMITIES: No lower extremity edema  LABORATORY DATA:  I have reviewed the data as listed   Chemistry      Component Value Date/Time   NA 143 06/14/2016 1036   NA 141 04/07/2016 0345   K 4.1 06/14/2016 1036   K 4.9 04/07/2016 0345   CL 108 04/07/2016 0345   CO2 27 06/14/2016 1036   CO2 25 04/07/2016 0345   BUN 9.1 06/14/2016 1036   BUN 25* 04/07/2016 0345   CREATININE 0.8 06/14/2016 1036   CREATININE 1.02* 04/07/2016 0345      Component Value Date/Time   CALCIUM 9.9 06/14/2016 1036   CALCIUM 9.5 04/07/2016 0345   ALKPHOS 70 06/14/2016 1036   ALKPHOS 92 10/06/2015 0616   AST 19 06/14/2016 1036   AST 61* 10/06/2015 0616   ALT 23 06/14/2016 1036   ALT 188* 10/06/2015 0616   BILITOT 0.33 06/14/2016 1036   BILITOT 0.5 10/06/2015 0616       Lab Results  Component Value Date   WBC 2.5* 06/21/2016   HGB 9.7* 06/21/2016   HCT 29.9* 06/21/2016   MCV 93.1 06/21/2016   PLT 245 06/21/2016   NEUTROABS 1.2* 06/21/2016     ASSESSMENT & PLAN:  Breast cancer of upper-outer quadrant of left female breast ([place]) Mammogram 03/02/2016: Hypoechoic masses in the upper outer quadrant left breast 2 adjacent  irregular masses 2.5-1.4 x 1.2 cm and the other 2.7 x 1.4 x 2.3 cm total span 6.4 cm, enlarged multiple axillary lymph nodes measuring 1.7 cm, T2 N1/N2 stage IIb vs IIIa Left breast biopsy 03/19/2016 12:30 position: Invasive ductal carcinoma with DCIS, 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 Neoadjuvant chemotherapy with Adriamycin and Cytoxan dose dense 4 followed by Abraxane weekly 12 2. Followed by breast conserving surgery with sentinel lymph node study vs targeted axillary dissection 3. Followed by adjuvant radiation therapy ------------------------------------------------------------------------------------------------------------------------------------ PREVENT trial: [id number]: Patient is enrolled in this trial atorvastatin versus placebo  No side effects related to the study drug. Current treatment: Completed 4 cycles of dose dense Adriamycin and Cytoxan; today is cycle 4/12 Abraxane  Chemotherapy toxicities: 1. Emergency room visit for severe restless leg syndrome resolved after she received Dilaudid. It is felt to be related to dexamethasone. D/C dexamethasone from  subsequent treatments. I also asked patient not to take oral dexamethasone. 2. Profound neutropenia (at [facility]): decreased the dose of cycle 2 AC  3. Profound fatigue  4. Grade 1-2 nausea  5. Depressive symptoms: Slight improvement than before 6. Anemia grade 1-2 chemotherapy 7. Insomnia: She currently takes Ambien, Ativan for restless legs. She plans to take the tramadol that had previously prescribed for her. I worry that she is getting tolerant to benzodiazepines. 9. Neutropenia on Abraxane: I reduce the dosage with cycle 4 of Abraxane. If necessary we may have to give Neupogen with the subsequent treatments if her counts still remain low. Okay to treat with an [place] of 1200.  Patient would like to get her labs  done through the port.  Monitoring closely for toxicities Return to clinic in 2 weeks for cycle 6 Abraxane   No orders of the defined types were placed in this encounter.   The patient has a good understanding of the overall plan. she agrees with it. she will call with any problems that may develop before the next visit here.   Rulon Eisenmenger, MD 06/21/2016

## 2016-06-21 NOTE — Assessment & Plan Note (Signed)
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 Neoadjuvant chemotherapy with Adriamycin and Cytoxan dose dense 4 followed by Abraxane weekly 12 2. Followed by breast conserving surgery with sentinel lymph node study vs targeted axillary dissection 3. Followed by adjuvant radiation therapy ------------------------------------------------------------------------------------------------------------------------------------ PREVENT trial: [id number]: Patient is enrolled in this trial atorvastatin versus placebo  No side effects related to the study drug. Current treatment: Completed 4 cycles of dose dense Adriamycin and Cytoxan; today is cycle 4/12 Abraxane  Chemotherapy toxicities: 1. Emergency room visit for severe restless leg syndrome resolved after she received Dilaudid. It is felt to be related to dexamethasone. D/C dexamethasone from subsequent treatments. I also asked patient not to take oral dexamethasone. 2. Profound neutropenia (at [facility]): decreased the dose of cycle 2 AC  3. Profound fatigue  4. Grade 1-2 nausea  5. Depressive symptoms: Slight improvement than before 6. Anemia grade 1-2 chemotherapy 7. Insomnia: She currently takes Ambien, Ativan for restless legs. She plans to take the tramadol that had previously prescribed for her. I worry that she is getting tolerant to benzodiazepines.  Monitoring closely for toxicities Return to clinic in 2 weeks for cycle 6 Abraxane

## 2016-06-25 ENCOUNTER — Encounter: Payer: Self-pay | Admitting: Hematology and Oncology

## 2016-06-25 NOTE — Progress Notes (Signed)
recd in my box&nbsp; disab aetna- left for dr. Lindi Adie to sign

## 2016-06-25 NOTE — Progress Notes (Signed)
recd in my box&nbsp; disab aetna- left for dr. Lindi Adie to sign- faxed [employer]- left copy for patient at front desk, medical recrds

## 2016-06-28 ENCOUNTER — Encounter: Payer: Self-pay | Admitting: *Deleted

## 2016-06-28 ENCOUNTER — Other Ambulatory Visit (HOSPITAL_BASED_OUTPATIENT_CLINIC_OR_DEPARTMENT_OTHER): Payer: 59

## 2016-06-28 ENCOUNTER — Other Ambulatory Visit: Payer: Self-pay | Admitting: *Deleted

## 2016-06-28 ENCOUNTER — Ambulatory Visit (HOSPITAL_BASED_OUTPATIENT_CLINIC_OR_DEPARTMENT_OTHER): Payer: 59

## 2016-06-28 VITALS — BP 133/54 | HR 80 | Temp 98.3°F | Resp 19 | Ht 64.0 in

## 2016-06-28 DIAGNOSIS — C50412 Malignant neoplasm of upper-outer quadrant of left female breast: Secondary | ICD-10-CM | POA: Diagnosis not present

## 2016-06-28 DIAGNOSIS — C773 Secondary and unspecified malignant neoplasm of axilla and upper limb lymph nodes: Secondary | ICD-10-CM

## 2016-06-28 DIAGNOSIS — Z5111 Encounter for antineoplastic chemotherapy: Secondary | ICD-10-CM

## 2016-06-28 LAB — COMPREHENSIVE METABOLIC PANEL
ALT: 24 U/L (ref 0–55)
AST: 20 U/L (ref 5–34)
Albumin: 3.7 g/dL (ref 3.5–5.0)
Alkaline Phosphatase: 66 U/L (ref 40–150)
Anion Gap: 7 mEq/L (ref 3–11)
BUN: 12 mg/dL (ref 7.0–26.0)
CO2: 26 mEq/L (ref 22–29)
Calcium: 9 mg/dL (ref 8.4–10.4)
Chloride: 109 mEq/L (ref 98–109)
Creatinine: 0.8 mg/dL (ref 0.6–1.1)
EGFR: 80 mL/min/{1.73_m2} — ABNORMAL LOW (ref >90)
Glucose: 89 mg/dl (ref 70–140)
Potassium: 3.8 mEq/L (ref 3.5–5.1)
Sodium: 142 mEq/L (ref 136–145)
Total Bilirubin: <0.3 mg/dL (ref 0.20–1.20)
Total Protein: 6.8 g/dL (ref 6.4–8.3)

## 2016-06-28 LAB — CBC WITH DIFFERENTIAL/PLATELET
BASO%: 2.5 % — ABNORMAL HIGH (ref 0.0–2.0)
Basophils Absolute: 0.1 10*3/uL (ref 0.0–0.1)
EOS%: 6.2 % (ref 0.0–7.0)
Eosinophils Absolute: 0.3 10*3/uL (ref 0.0–0.5)
HCT: 30.6 % — ABNORMAL LOW (ref 34.8–46.6)
HGB: 9.9 g/dL — ABNORMAL LOW (ref 11.6–15.9)
LYMPH%: 23.8 % (ref 14.0–49.7)
MCH: 30.3 pg (ref 25.1–34.0)
MCHC: 32.4 g/dL (ref 31.5–36.0)
MCV: 93.6 fL (ref 79.5–101.0)
MONO#: 0.4 10*3/uL (ref 0.1–0.9)
MONO%: 9.9 % (ref 0.0–14.0)
NEUT#: 2.3 10*3/uL (ref 1.5–6.5)
NEUT%: 57.6 % (ref 38.4–76.8)
Platelets: 222 10*3/uL (ref 145–400)
RBC: 3.27 10*6/uL — ABNORMAL LOW (ref 3.70–5.45)
RDW: 18.7 % — ABNORMAL HIGH (ref 11.2–14.5)
WBC: 4 10*3/uL (ref 3.9–10.3)
lymph#: 1 10*3/uL (ref 0.9–3.3)
nRBC: 0 % (ref 0–0)

## 2016-06-28 MED ORDER — PROCHLORPERAZINE MALEATE 10 MG PO TABS
ORAL_TABLET | ORAL | Status: AC
  Filled 2016-06-28: qty 1

## 2016-06-28 MED ORDER — SODIUM CHLORIDE 0.9% FLUSH
10.0000 mL | INTRAVENOUS | Status: DC | PRN
  Administered 2016-06-28: 10 mL
  Filled 2016-06-28: qty 10

## 2016-06-28 MED ORDER — SODIUM CHLORIDE 0.9 % IV SOLN
Freq: Once | INTRAVENOUS | Status: AC
  Administered 2016-06-28: 12:00:00 via INTRAVENOUS

## 2016-06-28 MED ORDER — PACLITAXEL PROTEIN-BOUND CHEMO INJECTION 100 MG
65.0000 mg/m2 | Freq: Once | INTRAVENOUS | Status: AC
  Administered 2016-06-28: 125 mg via INTRAVENOUS
  Filled 2016-06-28: qty 25

## 2016-06-28 MED ORDER — HEPARIN SOD (PORK) LOCK FLUSH 100 UNIT/ML IV SOLN
500.0000 [IU] | Freq: Once | INTRAVENOUS | Status: AC | PRN
  Administered 2016-06-28: 500 [IU]
  Filled 2016-06-28: qty 5

## 2016-06-28 MED ORDER — PROCHLORPERAZINE MALEATE 10 MG PO TABS
10.0000 mg | ORAL_TABLET | Freq: Once | ORAL | Status: AC
  Administered 2016-06-28: 10 mg via ORAL

## 2016-06-28 MED FILL — PRAMIPEXOLE 0.5 MG TABLET: 0.5 | 90 days supply | Qty: 90 | Fill #1

## 2016-06-28 NOTE — Patient Instructions (Signed)
[facility] Discharge Instructions for Patients Receiving Chemotherapy  Today you received the following chemotherapy agents Abraxane  To help prevent nausea and vomiting after your treatment, we encourage you to take your nausea medication as needed   If you develop nausea and vomiting that is not controlled by your nausea medication, call the clinic.   BELOW ARE SYMPTOMS THAT SHOULD BE REPORTED IMMEDIATELY:  *FEVER GREATER THAN 100.5 F  *CHILLS WITH OR WITHOUT FEVER  NAUSEA AND VOMITING THAT IS NOT CONTROLLED WITH YOUR NAUSEA MEDICATION  *UNUSUAL SHORTNESS OF BREATH  *UNUSUAL BRUISING OR BLEEDING  TENDERNESS IN MOUTH AND THROAT WITH OR WITHOUT PRESENCE OF ULCERS  *URINARY PROBLEMS  *BOWEL PROBLEMS  UNUSUAL RASH Items with * indicate a potential emergency and should be followed up as soon as possible.  Feel free to call the clinic you have any questions or concerns. The clinic phone number is [phone].  Please show the CHEMO ALERT CARD at check-in to the Emergency Department and triage nurse.   

## 2016-07-03 MED FILL — BUPROPION HCL XL 150 MG TAB: 150 | 90 days supply | Qty: 90 | Fill #2

## 2016-07-04 NOTE — Assessment & Plan Note (Signed)
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 Neoadjuvant chemotherapy with Adriamycin and Cytoxan dose dense 4 followed by Abraxane weekly 12 2. Followed by breast conserving surgery with sentinel lymph node study vs targeted axillary dissection 3. Followed by adjuvant radiation therapy ------------------------------------------------------------------------------------------------------------------------------------ PREVENT trial: [id number]: Patient is enrolled in this trial atorvastatin versus placebo  No side effects related to the study drug. Current treatment: Completed 4 cycles of dose dense Adriamycin and Cytoxan; today is cycle 6/12 Abraxane  Chemotherapy toxicities: 1. Emergency room visit for severe restless leg syndrome resolved after she received Dilaudid. It is felt to be related to dexamethasone. D/C dexamethasone from subsequent treatments. I also asked patient not to take oral dexamethasone. 2. Profound neutropenia (at [facility]): decreased the dose of cycle 2 AC  3. Profound fatigue  4. Grade 1-2 nausea  5. Depressive symptoms: Slight improvement than before 6. Anemia grade 1-2 chemotherapy 7. Insomnia: She currently takes Ambien, Ativan for restless legs. She plans to take the tramadol that had previously prescribed for her. I worry that she is getting tolerant to benzodiazepines. 9. Neutropenia on Abraxane: I reduce the dosage with cycle 4 of Abraxane. If necessary we may have to give Neupogen with the subsequent treatments if her  counts still remain low. Okay to treat with an [place] of 1200.  Patient would like to get her labs done through the port.  Monitoring closely for toxicities Return to clinic in 2 weeks for cycle 8 Abraxane

## 2016-07-05 ENCOUNTER — Encounter: Payer: Self-pay | Admitting: Hematology and Oncology

## 2016-07-05 ENCOUNTER — Ambulatory Visit (HOSPITAL_BASED_OUTPATIENT_CLINIC_OR_DEPARTMENT_OTHER): Payer: 59 | Admitting: Hematology and Oncology

## 2016-07-05 ENCOUNTER — Ambulatory Visit (HOSPITAL_BASED_OUTPATIENT_CLINIC_OR_DEPARTMENT_OTHER): Payer: 59

## 2016-07-05 ENCOUNTER — Encounter: Payer: Self-pay | Admitting: *Deleted

## 2016-07-05 ENCOUNTER — Ambulatory Visit: Payer: 59

## 2016-07-05 ENCOUNTER — Other Ambulatory Visit (HOSPITAL_BASED_OUTPATIENT_CLINIC_OR_DEPARTMENT_OTHER): Payer: 59

## 2016-07-05 DIAGNOSIS — G4700 Insomnia, unspecified: Secondary | ICD-10-CM

## 2016-07-05 DIAGNOSIS — C50412 Malignant neoplasm of upper-outer quadrant of left female breast: Secondary | ICD-10-CM

## 2016-07-05 DIAGNOSIS — R110 Nausea: Secondary | ICD-10-CM

## 2016-07-05 DIAGNOSIS — C773 Secondary and unspecified malignant neoplasm of axilla and upper limb lymph nodes: Secondary | ICD-10-CM | POA: Diagnosis not present

## 2016-07-05 DIAGNOSIS — D701 Agranulocytosis secondary to cancer chemotherapy: Secondary | ICD-10-CM | POA: Diagnosis not present

## 2016-07-05 DIAGNOSIS — R530 Neoplastic (malignant) related fatigue: Secondary | ICD-10-CM

## 2016-07-05 DIAGNOSIS — D6481 Anemia due to antineoplastic chemotherapy: Secondary | ICD-10-CM | POA: Diagnosis not present

## 2016-07-05 DIAGNOSIS — Z5111 Encounter for antineoplastic chemotherapy: Secondary | ICD-10-CM | POA: Diagnosis not present

## 2016-07-05 LAB — COMPREHENSIVE METABOLIC PANEL
ALT: 27 U/L (ref 0–55)
AST: 20 U/L (ref 5–34)
Albumin: 3.7 g/dL (ref 3.5–5.0)
Alkaline Phosphatase: 67 U/L (ref 40–150)
Anion Gap: 8 mEq/L (ref 3–11)
BUN: 18.8 mg/dL (ref 7.0–26.0)
CO2: 25 mEq/L (ref 22–29)
Calcium: 9.3 mg/dL (ref 8.4–10.4)
Chloride: 107 mEq/L (ref 98–109)
Creatinine: 0.8 mg/dL (ref 0.6–1.1)
EGFR: 81 mL/min/{1.73_m2} — ABNORMAL LOW (ref >90)
Glucose: 91 mg/dl (ref 70–140)
Potassium: 4.2 mEq/L (ref 3.5–5.1)
Sodium: 140 mEq/L (ref 136–145)
Total Bilirubin: 0.33 mg/dL (ref 0.20–1.20)
Total Protein: 6.9 g/dL (ref 6.4–8.3)

## 2016-07-05 LAB — CBC WITH DIFFERENTIAL/PLATELET
BASO%: 0.8 % (ref 0.0–2.0)
Basophils Absolute: 0 10*3/uL (ref 0.0–0.1)
EOS%: 5 % (ref 0.0–7.0)
Eosinophils Absolute: 0.3 10*3/uL (ref 0.0–0.5)
HCT: 31.4 % — ABNORMAL LOW (ref 34.8–46.6)
HGB: 10.2 g/dL — ABNORMAL LOW (ref 11.6–15.9)
LYMPH%: 18.8 % (ref 14.0–49.7)
MCH: 31.1 pg (ref 25.1–34.0)
MCHC: 32.5 g/dL (ref 31.5–36.0)
MCV: 95.7 fL (ref 79.5–101.0)
MONO#: 0.5 10*3/uL (ref 0.1–0.9)
MONO%: 9.6 % (ref 0.0–14.0)
NEUT#: 3.4 10*3/uL (ref 1.5–6.5)
NEUT%: 65.8 % (ref 38.4–76.8)
Platelets: 219 10*3/uL (ref 145–400)
RBC: 3.28 10*6/uL — ABNORMAL LOW (ref 3.70–5.45)
RDW: 18.1 % — ABNORMAL HIGH (ref 11.2–14.5)
WBC: 5.2 10*3/uL (ref 3.9–10.3)
lymph#: 1 10*3/uL (ref 0.9–3.3)

## 2016-07-05 MED ORDER — PROCHLORPERAZINE MALEATE 10 MG PO TABS
ORAL_TABLET | ORAL | Status: AC
  Filled 2016-07-05: qty 1

## 2016-07-05 MED ORDER — SODIUM CHLORIDE 0.9% FLUSH
10.0000 mL | INTRAVENOUS | Status: DC | PRN
  Administered 2016-07-05: 10 mL
  Filled 2016-07-05: qty 10

## 2016-07-05 MED ORDER — SODIUM CHLORIDE 0.9 % IJ SOLN
10.0000 mL | INTRAMUSCULAR | Status: DC | PRN
  Administered 2016-07-05: 10 mL via INTRAVENOUS
  Filled 2016-07-05: qty 10

## 2016-07-05 MED ORDER — SODIUM CHLORIDE 0.9 % IV SOLN
Freq: Once | INTRAVENOUS | Status: AC
  Administered 2016-07-05: 13:00:00 via INTRAVENOUS

## 2016-07-05 MED ORDER — PACLITAXEL PROTEIN-BOUND CHEMO INJECTION 100 MG
65.0000 mg/m2 | Freq: Once | INTRAVENOUS | Status: AC
  Administered 2016-07-05: 125 mg via INTRAVENOUS
  Filled 2016-07-05: qty 25

## 2016-07-05 MED ORDER — HEPARIN SOD (PORK) LOCK FLUSH 100 UNIT/ML IV SOLN
500.0000 [IU] | Freq: Once | INTRAVENOUS | Status: AC | PRN
  Administered 2016-07-05: 500 [IU]
  Filled 2016-07-05: qty 5

## 2016-07-05 MED ORDER — PROCHLORPERAZINE MALEATE 10 MG PO TABS
10.0000 mg | ORAL_TABLET | Freq: Once | ORAL | Status: AC
  Administered 2016-07-05: 10 mg via ORAL

## 2016-07-05 NOTE — Progress Notes (Signed)
Patient Care Team: [name], MD as PCP - General (Internal Medicine)  DIAGNOSIS: Breast cancer of upper-outer quadrant of left female breast [facility])   Staging form: Breast, AJCC 7th Edition   - Clinical stage from 03/28/2016: Stage IIB (T2, N1, M0) - Unsigned  SUMMARY OF ONCOLOGIC HISTORY:   Breast cancer of upper-outer quadrant of left female breast ([place])   03/02/2016 Mammogram    Hypoechoic masses in the upper outer quadrant left breast 2 adjacent irregular masses 2.5-1.4 x 1.2 cm and the other 2.7 x 1.4 x 2.3 cm total span 6.4 cm, enlarged multiple axillary lymph nodes measuring 1.7 cm, T2 N1/N2 stage IIb vs IIIa     03/19/2016 Initial Diagnosis    Left breast biopsy 12:30 position: Invasive ductal carcinoma with DCIS, 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 Today's cycle 6. She is tolerating the treatment extremely well. She is to profoundly fatigued. Her fatigue is still present but not as severe. She is even able to walk in a grocery store without assistance. She does not have any nausea or vomiting.  REVIEW OF SYSTEMS:   Constitutional: Denies fevers, chills or abnormal weight loss Eyes: Denies blurriness of vision Ears, nose, mouth, throat, and face: Denies mucositis or sore throat Respiratory: Denies cough, dyspnea or wheezes Cardiovascular: Denies palpitation, chest discomfort Gastrointestinal:  Denies nausea, heartburn or change in bowel habits Skin: Denies abnormal skin rashes Lymphatics: Denies new  lymphadenopathy or easy bruising Neurological:Denies numbness, tingling or new weaknesses Behavioral/Psych: Mood is stable, no new changes  Extremities: No lower extremity edema  All other systems were reviewed with the patient and are negative.  I have reviewed the past medical history, past surgical history, social history and family history with the patient and they are unchanged from previous note.  ALLERGIES:  has No Known Allergies.  MEDICATIONS:  Current Outpatient Prescriptions  Medication Sig Dispense Refill  . Atorvastatin Calcium (INVESTIGATIONAL ATORVASTATIN/PLACEBO) 40 MG tablet [facility] [address] Take 1 tablet by mouth daily. Take 2 doses (these doses must be 12 hours apart) prior to first chemotherapy treatment. Then take 1 tablet daily by mouth with or without food. 180 tablet 0  . buPROPion (WELLBUTRIN XL) 150 MG 24 hr tablet Take 150 mg by mouth daily.  4  . cetirizine (ZYRTEC) 10 MG tablet Take 10 mg by mouth daily.    . citalopram (CELEXA) 40 MG tablet   3  . clonazePAM (KLONOPIN) 1 MG tablet Take 0.5 mg by mouth at bedtime.     Marland Kitchen loratadine (CLARITIN) 10 MG tablet Take 10 mg by mouth daily. Reported on 06/21/2016    . LORazepam (ATIVAN) 0.5 MG tablet Take 0.5 mg by mouth daily as needed. Reported on 05/14/2016  0  . nystatin-triamcinolone ointment (MYCOLOG) Apply 1 application topically 2 (two) times daily. 60 g 3  . pramipexole (MIRAPEX) 0.5 MG tablet Take 0.5 mg by mouth at bedtime.    . RESTASIS 0.05 % ophthalmic emulsion Place 1 drop into both  eyes 2 (two) times daily as needed. For dry eyes.  4  . traMADol (ULTRAM) 50 MG tablet Take 1 tablet (50 mg total) by mouth every 6 (six) hours as needed. 30 tablet 1  . zolpidem (AMBIEN) 5 MG tablet Take 5 mg by mouth at bedtime.     No current facility-administered medications for this visit.     PHYSICAL EXAMINATION: ECOG PERFORMANCE STATUS: 1 - Symptomatic but completely ambulatory  Vitals:   07/05/16 1159  BP:  106/63  Pulse: 78  Resp: 18  Temp: 98.6 F (37 C)   Filed Weights   07/05/16 1159  Weight: 197 lb 11.2 oz (89.7 kg)    GENERAL:alert, no distress and comfortable SKIN: skin color, texture, turgor are normal, no rashes or significant lesions EYES: normal, Conjunctiva are pink and non-injected, sclera clear OROPHARYNX:no exudate, no erythema and lips, buccal mucosa, and tongue normal  NECK: supple, thyroid normal size, non-tender, without nodularity LYMPH:  no palpable lymphadenopathy in the cervical, axillary or inguinal LUNGS: clear to auscultation and percussion with normal breathing effort HEART: regular rate & rhythm and no murmurs and no lower extremity edema ABDOMEN:abdomen soft, non-tender and normal bowel sounds MUSCULOSKELETAL:no cyanosis of digits and no clubbing  NEURO: alert & oriented x 3 with fluent speech, no focal motor/sensory deficits EXTREMITIES: No lower extremity edema LABORATORY DATA:  I have reviewed the data as listed   Chemistry      Component Value Date/Time   NA 140 07/05/2016 1114   K 4.2 07/05/2016 1114   CL 108 04/07/2016 0345   CO2 25 07/05/2016 1114   BUN 18.8 07/05/2016 1114   CREATININE 0.8 07/05/2016 1114      Component Value Date/Time   CALCIUM 9.3 07/05/2016 1114   ALKPHOS 67 07/05/2016 1114   AST 20 07/05/2016 1114   ALT 27 07/05/2016 1114   BILITOT 0.33 07/05/2016 1114       Lab Results  Component Value Date   WBC 5.2 07/05/2016   HGB 10.2 (L) 07/05/2016   HCT 31.4 (L) 07/05/2016   MCV 95.7 07/05/2016   PLT 219 07/05/2016   NEUTROABS 3.4 07/05/2016     ASSESSMENT & PLAN:  Breast cancer of upper-outer quadrant of left female breast ([place]) Mammogram 03/02/2016: Hypoechoic masses in the upper outer quadrant left breast 2 adjacent irregular masses 2.5-1.4 x 1.2 cm and the other 2.7 x 1.4 x 2.3 cm total span 6.4 cm, enlarged multiple axillary lymph nodes measuring 1.7 cm, T2 N1/N2 stage IIb vs IIIa Left breast biopsy  03/19/2016 12:30 position: Invasive ductal carcinoma with DCIS, 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 Neoadjuvant chemotherapy with Adriamycin and Cytoxan dose dense 4 followed by Abraxane weekly 12 2. Followed by breast conserving surgery with sentinel lymph node study vs targeted axillary dissection 3. Followed by adjuvant radiation therapy ------------------------------------------------------------------------------------------------------------------------------------ PREVENT trial: [id number]: Patient is enrolled in this trial atorvastatin versus placebo  No side effects related to the study drug. Current treatment: Completed 4 cycles of dose dense Adriamycin and Cytoxan; today is cycle 6/12 Abraxane  Chemotherapy toxicities: 1. Emergency room visit for severe restless leg syndrome resolved after she received Dilaudid. It is felt to be related to dexamethasone. D/C dexamethasone from subsequent treatments. I also asked patient not to take oral dexamethasone. 2. Profound neutropenia (at [facility]): decreased the dose of cycle 2 AC  3. Profound fatigue  4. Grade 1-2 nausea  5. Depressive symptoms: Slight improvement than before 6. Anemia grade 1-2 chemotherapy 7. Insomnia: She currently takes Ambien, Ativan for restless legs. She plans to take the tramadol that had previously prescribed for her. I worry that she is getting tolerant to benzodiazepines. 9. Neutropenia on Abraxane: I reduce the dosage with cycle 4 of Abraxane.  Patient would like to get her labs done through the port.  Monitoring closely for toxicities Return to clinic in 2 weeks for cycle 8 Abraxane   No orders of the defined types were placed in this encounter.  The patient has a good understanding of the overall plan. she agrees with it. she will call with any problems that may develop before the next  visit here.   Rulon Eisenmenger, MD 07/05/16

## 2016-07-05 NOTE — Patient Instructions (Signed)

## 2016-07-09 DIAGNOSIS — M25571 Pain in right ankle and joints of right foot: Secondary | ICD-10-CM | POA: Diagnosis not present

## 2016-07-10 DIAGNOSIS — G4701 Insomnia due to medical condition: Secondary | ICD-10-CM | POA: Diagnosis not present

## 2016-07-12 ENCOUNTER — Other Ambulatory Visit: Payer: 59

## 2016-07-12 ENCOUNTER — Ambulatory Visit: Payer: 59

## 2016-07-12 ENCOUNTER — Other Ambulatory Visit (HOSPITAL_BASED_OUTPATIENT_CLINIC_OR_DEPARTMENT_OTHER): Payer: 59

## 2016-07-12 ENCOUNTER — Encounter: Payer: Self-pay | Admitting: *Deleted

## 2016-07-12 ENCOUNTER — Ambulatory Visit (HOSPITAL_BASED_OUTPATIENT_CLINIC_OR_DEPARTMENT_OTHER): Payer: 59

## 2016-07-12 VITALS — BP 124/66 | HR 72 | Temp 98.3°F | Resp 16

## 2016-07-12 DIAGNOSIS — C50412 Malignant neoplasm of upper-outer quadrant of left female breast: Secondary | ICD-10-CM | POA: Diagnosis not present

## 2016-07-12 DIAGNOSIS — C773 Secondary and unspecified malignant neoplasm of axilla and upper limb lymph nodes: Secondary | ICD-10-CM

## 2016-07-12 DIAGNOSIS — Z5111 Encounter for antineoplastic chemotherapy: Secondary | ICD-10-CM

## 2016-07-12 LAB — CBC WITH DIFFERENTIAL/PLATELET
BASO%: 1 % (ref 0.0–2.0)
Basophils Absolute: 0.1 10*3/uL (ref 0.0–0.1)
EOS%: 3.5 % (ref 0.0–7.0)
Eosinophils Absolute: 0.2 10*3/uL (ref 0.0–0.5)
HCT: 33.3 % — ABNORMAL LOW (ref 34.8–46.6)
HGB: 10.8 g/dL — ABNORMAL LOW (ref 11.6–15.9)
LYMPH%: 19.9 % (ref 14.0–49.7)
MCH: 31.1 pg (ref 25.1–34.0)
MCHC: 32.4 g/dL (ref 31.5–36.0)
MCV: 96 fL (ref 79.5–101.0)
MONO#: 0.5 10*3/uL (ref 0.1–0.9)
MONO%: 9.9 % (ref 0.0–14.0)
NEUT#: 3.4 10*3/uL (ref 1.5–6.5)
NEUT%: 65.7 % (ref 38.4–76.8)
Platelets: 313 10*3/uL (ref 145–400)
RBC: 3.47 10*6/uL — ABNORMAL LOW (ref 3.70–5.45)
RDW: 17.6 % — ABNORMAL HIGH (ref 11.2–14.5)
WBC: 5.2 10*3/uL (ref 3.9–10.3)
lymph#: 1 10*3/uL (ref 0.9–3.3)

## 2016-07-12 LAB — COMPREHENSIVE METABOLIC PANEL
ALT: 24 U/L (ref 0–55)
AST: 15 U/L (ref 5–34)
Albumin: 3.9 g/dL (ref 3.5–5.0)
Alkaline Phosphatase: 73 U/L (ref 40–150)
Anion Gap: 8 mEq/L (ref 3–11)
BUN: 16.8 mg/dL (ref 7.0–26.0)
CO2: 27 mEq/L (ref 22–29)
Calcium: 9.7 mg/dL (ref 8.4–10.4)
Chloride: 106 mEq/L (ref 98–109)
Creatinine: 0.8 mg/dL (ref 0.6–1.1)
EGFR: 77 mL/min/{1.73_m2} — ABNORMAL LOW (ref >90)
Glucose: 83 mg/dl (ref 70–140)
Potassium: 4 mEq/L (ref 3.5–5.1)
Sodium: 141 mEq/L (ref 136–145)
Total Bilirubin: 0.33 mg/dL (ref 0.20–1.20)
Total Protein: 7.4 g/dL (ref 6.4–8.3)

## 2016-07-12 MED ORDER — SODIUM CHLORIDE 0.9% FLUSH
10.0000 mL | INTRAVENOUS | Status: DC | PRN
  Administered 2016-07-12: 10 mL
  Filled 2016-07-12: qty 10

## 2016-07-12 MED ORDER — PROCHLORPERAZINE MALEATE 10 MG PO TABS
ORAL_TABLET | ORAL | Status: AC
  Filled 2016-07-12: qty 1

## 2016-07-12 MED ORDER — SODIUM CHLORIDE 0.9 % IV SOLN
Freq: Once | INTRAVENOUS | Status: AC
  Administered 2016-07-12: 12:00:00 via INTRAVENOUS

## 2016-07-12 MED ORDER — PACLITAXEL PROTEIN-BOUND CHEMO INJECTION 100 MG
65.0000 mg/m2 | Freq: Once | INTRAVENOUS | Status: AC
  Administered 2016-07-12: 125 mg via INTRAVENOUS
  Filled 2016-07-12: qty 25

## 2016-07-12 MED ORDER — HEPARIN SOD (PORK) LOCK FLUSH 100 UNIT/ML IV SOLN
500.0000 [IU] | Freq: Once | INTRAVENOUS | Status: AC | PRN
  Administered 2016-07-12: 500 [IU]
  Filled 2016-07-12: qty 5

## 2016-07-12 MED ORDER — PROCHLORPERAZINE MALEATE 10 MG PO TABS
10.0000 mg | ORAL_TABLET | Freq: Once | ORAL | Status: AC
  Administered 2016-07-12: 10 mg via ORAL

## 2016-07-19 ENCOUNTER — Encounter: Payer: Self-pay | Admitting: Hematology and Oncology

## 2016-07-19 ENCOUNTER — Ambulatory Visit (HOSPITAL_BASED_OUTPATIENT_CLINIC_OR_DEPARTMENT_OTHER): Payer: 59 | Admitting: Hematology and Oncology

## 2016-07-19 ENCOUNTER — Other Ambulatory Visit: Payer: 59

## 2016-07-19 ENCOUNTER — Encounter: Payer: Self-pay | Admitting: *Deleted

## 2016-07-19 ENCOUNTER — Other Ambulatory Visit (HOSPITAL_BASED_OUTPATIENT_CLINIC_OR_DEPARTMENT_OTHER): Payer: 59

## 2016-07-19 ENCOUNTER — Ambulatory Visit (HOSPITAL_BASED_OUTPATIENT_CLINIC_OR_DEPARTMENT_OTHER): Payer: 59

## 2016-07-19 DIAGNOSIS — C773 Secondary and unspecified malignant neoplasm of axilla and upper limb lymph nodes: Secondary | ICD-10-CM

## 2016-07-19 DIAGNOSIS — D6481 Anemia due to antineoplastic chemotherapy: Secondary | ICD-10-CM

## 2016-07-19 DIAGNOSIS — C50412 Malignant neoplasm of upper-outer quadrant of left female breast: Secondary | ICD-10-CM

## 2016-07-19 DIAGNOSIS — Z5111 Encounter for antineoplastic chemotherapy: Secondary | ICD-10-CM | POA: Diagnosis not present

## 2016-07-19 LAB — CBC WITH DIFFERENTIAL/PLATELET
BASO%: 1.1 % (ref 0.0–2.0)
Basophils Absolute: 0.1 10*3/uL (ref 0.0–0.1)
EOS%: 3.6 % (ref 0.0–7.0)
Eosinophils Absolute: 0.2 10*3/uL (ref 0.0–0.5)
HCT: 32.6 % — ABNORMAL LOW (ref 34.8–46.6)
HGB: 10.6 g/dL — ABNORMAL LOW (ref 11.6–15.9)
LYMPH%: 20 % (ref 14.0–49.7)
MCH: 31.3 pg (ref 25.1–34.0)
MCHC: 32.6 g/dL (ref 31.5–36.0)
MCV: 96 fL (ref 79.5–101.0)
MONO#: 0.4 10*3/uL (ref 0.1–0.9)
MONO%: 8.1 % (ref 0.0–14.0)
NEUT#: 3.2 10*3/uL (ref 1.5–6.5)
NEUT%: 67.2 % (ref 38.4–76.8)
Platelets: 310 10*3/uL (ref 145–400)
RBC: 3.4 10*6/uL — ABNORMAL LOW (ref 3.70–5.45)
RDW: 17.9 % — ABNORMAL HIGH (ref 11.2–14.5)
WBC: 4.7 10*3/uL (ref 3.9–10.3)
lymph#: 0.9 10*3/uL (ref 0.9–3.3)

## 2016-07-19 LAB — COMPREHENSIVE METABOLIC PANEL
ALT: 14 U/L (ref 0–55)
AST: 14 U/L (ref 5–34)
Albumin: 3.7 g/dL (ref 3.5–5.0)
Alkaline Phosphatase: 72 U/L (ref 40–150)
Anion Gap: 7 mEq/L (ref 3–11)
BUN: 14.1 mg/dL (ref 7.0–26.0)
CO2: 27 mEq/L (ref 22–29)
Calcium: 9.5 mg/dL (ref 8.4–10.4)
Chloride: 108 mEq/L (ref 98–109)
Creatinine: 0.9 mg/dL (ref 0.6–1.1)
EGFR: 72 mL/min/{1.73_m2} — ABNORMAL LOW (ref >90)
Glucose: 93 mg/dl (ref 70–140)
Potassium: 3.8 mEq/L (ref 3.5–5.1)
Sodium: 142 mEq/L (ref 136–145)
Total Bilirubin: 0.37 mg/dL (ref 0.20–1.20)
Total Protein: 7.1 g/dL (ref 6.4–8.3)

## 2016-07-19 MED ORDER — SODIUM CHLORIDE 0.9% FLUSH
10.0000 mL | INTRAVENOUS | Status: AC | PRN
  Administered 2016-07-19: 10 mL
  Filled 2016-07-19: qty 10

## 2016-07-19 MED ORDER — PACLITAXEL PROTEIN-BOUND CHEMO INJECTION 100 MG
65.0000 mg/m2 | Freq: Once | INTRAVENOUS | Status: AC
  Administered 2016-07-19: 125 mg via INTRAVENOUS
  Filled 2016-07-19: qty 25

## 2016-07-19 MED ORDER — HEPARIN SOD (PORK) LOCK FLUSH 100 UNIT/ML IV SOLN
500.0000 [IU] | Freq: Once | INTRAVENOUS | Status: AC | PRN
  Administered 2016-07-19: 500 [IU]
  Filled 2016-07-19: qty 5

## 2016-07-19 MED ORDER — SODIUM CHLORIDE 0.9 % IV SOLN
Freq: Once | INTRAVENOUS | Status: AC
  Administered 2016-07-19: 13:00:00 via INTRAVENOUS

## 2016-07-19 MED ORDER — PROCHLORPERAZINE MALEATE 10 MG PO TABS
10.0000 mg | ORAL_TABLET | Freq: Once | ORAL | Status: AC
  Administered 2016-07-19: 10 mg via ORAL

## 2016-07-19 MED ORDER — PROCHLORPERAZINE MALEATE 10 MG PO TABS
ORAL_TABLET | ORAL | Status: AC
  Filled 2016-07-19: qty 1

## 2016-07-19 NOTE — Patient Instructions (Signed)
[facility] Discharge Instructions for Patients Receiving Chemotherapy  Today you received the following chemotherapy agents:  Abraxane  To help prevent nausea and vomiting after your treatment, we encourage you to take your nausea medication as ordered per MD.   If you develop nausea and vomiting that is not controlled by your nausea medication, call the clinic.   BELOW ARE SYMPTOMS THAT SHOULD BE REPORTED IMMEDIATELY:  *FEVER GREATER THAN 100.5 F  *CHILLS WITH OR WITHOUT FEVER  NAUSEA AND VOMITING THAT IS NOT CONTROLLED WITH YOUR NAUSEA MEDICATION  *UNUSUAL SHORTNESS OF BREATH  *UNUSUAL BRUISING OR BLEEDING  TENDERNESS IN MOUTH AND THROAT WITH OR WITHOUT PRESENCE OF ULCERS  *URINARY PROBLEMS  *BOWEL PROBLEMS  UNUSUAL RASH Items with * indicate a potential emergency and should be followed up as soon as possible.  Feel free to call the clinic you have any questions or concerns. The clinic phone number is [phone].  Please show the CHEMO ALERT CARD at check-in to the Emergency Department and triage nurse.   

## 2016-07-19 NOTE — Assessment & Plan Note (Signed)
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 Neoadjuvant chemotherapy with Adriamycin and Cytoxan dose dense 4 followed by Abraxane weekly 12 2. Followed by breast conserving surgery with sentinel lymph node study vs targeted axillary dissection 3. Followed by adjuvant radiation therapy ------------------------------------------------------------------------------------------------------------------------------------ PREVENT trial: [id number]: Patient is enrolled in this trial atorvastatin versus placebo No side effects related to the study drug. Current treatment: Completed 4 cycles of dose dense Adriamycin and Cytoxan; today is cycle 8/12 Abraxane  Chemotherapy toxicities: 1. Emergency room visit for severe restless leg syndrome resolved after she received Dilaudid. It is felt to be related to dexamethasone. D/C dexamethasone from subsequent treatments. I also asked patient not to take oral dexamethasone. 2. Profound neutropenia (at [facility]): decreased the dose of cycle 2 AC  3. Profound fatigue  4. Grade 1-2 nausea  5. Depressive symptoms: Slight improvement than before 6. Anemia grade 1-2 chemotherapy 7. Insomnia: She currently takes Ambien, Ativan for restless legs. She plans to take the tramadol that had previously prescribed for her. I worry that she is getting tolerant to benzodiazepines. 9. Neutropenia on Abraxane: I reduced the dosage with cycle 4 of Abraxane.  Patient would like to get her labs done through the port.  Monitoring closely  for toxicities Return to clinic in 2 weeks for cycle 10 Abraxane

## 2016-07-19 NOTE — Addendum Note (Signed)
Addended by: San Morelle on: 07/19/2016 01:11 PM   Modules accepted: Orders

## 2016-07-19 NOTE — Progress Notes (Signed)
Patient Care Team: [name], MD as PCP - General (Internal Medicine)  DIAGNOSIS: Breast cancer of upper-outer quadrant of left female breast [facility])   Staging form: Breast, AJCC 7th Edition   - Clinical stage from 03/28/2016: Stage IIB (T2, N1, M0) - Unsigned  SUMMARY OF ONCOLOGIC HISTORY:   Breast cancer of upper-outer quadrant of left female breast ([place])   03/02/2016 Mammogram    Hypoechoic masses in the upper outer quadrant left breast 2 adjacent irregular masses 2.5-1.4 x 1.2 cm and the other 2.7 x 1.4 x 2.3 cm total span 6.4 cm, enlarged multiple axillary lymph nodes measuring 1.7 cm, T2 N1/N2 stage IIb vs IIIa     03/19/2016 Initial Diagnosis    Left breast biopsy 12:30 position: Invasive ductal carcinoma with DCIS, 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 She is tolerating this Abraxane very well. She denies any nausea vomiting. She continues to have metallic taste in the mouth. Her sleep has improved significantly with the change in her medication and she is doing much better. She is not very good mood as well. 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  REVIEW OF SYSTEMS:   Constitutional: Denies fevers, chills or abnormal weight loss Eyes: Denies blurriness of vision Ears, nose, mouth, throat, and face: Denies mucositis or sore throat Respiratory: Denies cough, dyspnea or wheezes Cardiovascular: Denies palpitation, chest discomfort Gastrointestinal:  Denies nausea, heartburn or change in  bowel habits Skin: Denies abnormal skin rashes Lymphatics: Denies new lymphadenopathy or easy bruising Neurological:Denies numbness, tingling or new weaknesses, sleeping better Behavioral/Psych: Mood is stable, no new changes  Extremities: No lower extremity edema  All other systems were reviewed with the patient and are negative.  I have reviewed the past medical history, past surgical history, social history and family history with the patient and they are unchanged from previous note.  ALLERGIES:  has No Known Allergies.  MEDICATIONS:  Current Outpatient Prescriptions  Medication Sig Dispense Refill  . Atorvastatin Calcium (INVESTIGATIONAL ATORVASTATIN/PLACEBO) 40 MG tablet [facility] [address] Take 1 tablet by mouth daily. Take 2 doses (these doses must be 12 hours apart) prior to first chemotherapy treatment. Then take 1 tablet daily by mouth with or without food. 180 tablet 0  . buPROPion (WELLBUTRIN XL) 150 MG 24 hr tablet Take 150 mg by mouth daily.  4  . cetirizine (ZYRTEC) 10 MG tablet Take 10 mg by mouth daily.    . citalopram (CELEXA) 40 MG tablet   3  . clonazePAM (KLONOPIN) 1 MG tablet Take 0.5 mg by mouth at bedtime.     Marland Kitchen loratadine (CLARITIN) 10 MG tablet Take 10 mg by mouth daily. Reported on 06/21/2016    . LORazepam (ATIVAN) 0.5 MG tablet Take 0.5 mg by mouth daily as needed. Reported on 05/14/2016  0  . nystatin-triamcinolone ointment (MYCOLOG) Apply 1 application topically 2 (two) times daily. 60 g 3  . pramipexole (MIRAPEX) 0.5 MG tablet Take 0.5 mg by mouth at bedtime.    Marland Kitchen  RESTASIS 0.05 % ophthalmic emulsion Place 1 drop into both eyes 2 (two) times daily as needed. For dry eyes.  4  . traMADol (ULTRAM) 50 MG tablet Take 1 tablet (50 mg total) by mouth every 6 (six) hours as needed. 30 tablet 1  . zolpidem (AMBIEN) 5 MG tablet Take 5 mg by mouth at bedtime.     No current facility-administered medications for this visit.     PHYSICAL EXAMINATION: ECOG  PERFORMANCE STATUS: 1 - Symptomatic but completely ambulatory  Vitals:   07/19/16 1136  BP: 113/67  Pulse: 96  Resp: 18  Temp: 98.1 F (36.7 C)   Filed Weights   07/19/16 1136  Weight: 194 lb 9.6 oz (88.3 kg)    GENERAL:alert, no distress and comfortable SKIN: skin color, texture, turgor are normal, no rashes or significant lesions EYES: normal, Conjunctiva are pink and non-injected, sclera clear OROPHARYNX:no exudate, no erythema and lips, buccal mucosa, and tongue normal  NECK: supple, thyroid normal size, non-tender, without nodularity LYMPH:  no palpable lymphadenopathy in the cervical, axillary or inguinal LUNGS: clear to auscultation and percussion with normal breathing effort HEART: regular rate & rhythm and no murmurs and no lower extremity edema ABDOMEN:abdomen soft, non-tender and normal bowel sounds MUSCULOSKELETAL:no cyanosis of digits and no clubbing  NEURO: alert & oriented x 3 with fluent speech, no focal motor/sensory deficits EXTREMITIES: No lower extremity edema  LABORATORY DATA:  I have reviewed the data as listed   Chemistry      Component Value Date/Time   NA 141 07/12/2016 1038   K 4.0 07/12/2016 1038   CL 108 04/07/2016 0345   CO2 27 07/12/2016 1038   BUN 16.8 07/12/2016 1038   CREATININE 0.8 07/12/2016 1038      Component Value Date/Time   CALCIUM 9.7 07/12/2016 1038   ALKPHOS 73 07/12/2016 1038   AST 15 07/12/2016 1038   ALT 24 07/12/2016 1038   BILITOT 0.33 07/12/2016 1038       Lab Results  Component Value Date   WBC 4.7 07/19/2016   HGB 10.6 (L) 07/19/2016   HCT 32.6 (L) 07/19/2016   MCV 96.0 07/19/2016   PLT 310 07/19/2016   NEUTROABS 3.2 07/19/2016     ASSESSMENT & PLAN:  Breast cancer of upper-outer quadrant of left female breast ([place]) Mammogram 03/02/2016: Hypoechoic masses in the upper outer quadrant left breast 2 adjacent irregular masses 2.5-1.4 x 1.2 cm and the other 2.7 x 1.4 x 2.3 cm total span 6.4 cm, enlarged  multiple axillary lymph nodes measuring 1.7 cm, T2 N1/N2 stage IIb vs IIIa Left breast biopsy 03/19/2016 12:30 position: Invasive ductal carcinoma with DCIS, 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 Neoadjuvant chemotherapy with Adriamycin and Cytoxan dose dense 4 followed by Abraxane weekly 12 2. Followed by breast conserving surgery with sentinel lymph node study vs targeted axillary dissection 3. Followed by adjuvant radiation therapy ------------------------------------------------------------------------------------------------------------------------------------ PREVENT trial: [id number]: Patient is enrolled in this trial atorvastatin versus placebo No side effects related to the study drug. Current treatment: Completed 4 cycles of dose dense Adriamycin and Cytoxan; today is cycle 8/12 Abraxane  Chemotherapy toxicities: 1. Emergency room visit for severe restless leg syndrome resolved after she received Dilaudid. It is felt to be related to dexamethasone. D/C dexamethasone from subsequent treatments. I also asked patient not to take oral dexamethasone. 2. Profound neutropenia (at [facility]): decreased the  dose of cycle 2 AC  3. Profound fatigue  4. Grade 1-2 nausea  5. Depressive symptoms: Slight improvement than before 6. Anemia grade 1-2 chemotherapy 7. Insomnia: Her primary care physician adjusted her sleep aid and she has slept so well that she is so happy today. 9. Neutropenia on Abraxane: I reduced the dosage with cycle 4 of Abraxane and her counts have been excellent. Denies any neuropathy. Patient would like to get her labs done through the port. When she returns back we will start planning her follow-up MRIs and surgery appointments.  Monitoring closely for toxicities Return to clinic in 2 weeks for cycle 10 Abraxane   No orders of the defined types were placed in  this encounter.  The patient has a good understanding of the overall plan. she agrees with it. she will call with any problems that may develop before the next visit here.   Rulon Eisenmenger, MD 07/19/16

## 2016-07-26 ENCOUNTER — Other Ambulatory Visit (HOSPITAL_BASED_OUTPATIENT_CLINIC_OR_DEPARTMENT_OTHER): Payer: 59

## 2016-07-26 ENCOUNTER — Ambulatory Visit (HOSPITAL_BASED_OUTPATIENT_CLINIC_OR_DEPARTMENT_OTHER): Payer: 59

## 2016-07-26 ENCOUNTER — Ambulatory Visit: Payer: 59

## 2016-07-26 DIAGNOSIS — C773 Secondary and unspecified malignant neoplasm of axilla and upper limb lymph nodes: Secondary | ICD-10-CM | POA: Diagnosis not present

## 2016-07-26 DIAGNOSIS — C50412 Malignant neoplasm of upper-outer quadrant of left female breast: Secondary | ICD-10-CM | POA: Diagnosis not present

## 2016-07-26 DIAGNOSIS — Z5111 Encounter for antineoplastic chemotherapy: Secondary | ICD-10-CM | POA: Diagnosis not present

## 2016-07-26 LAB — COMPREHENSIVE METABOLIC PANEL
ALT: 25 U/L (ref 0–55)
AST: 18 U/L (ref 5–34)
Albumin: 3.7 g/dL (ref 3.5–5.0)
Alkaline Phosphatase: 74 U/L (ref 40–150)
Anion Gap: 6 mEq/L (ref 3–11)
BUN: 15.2 mg/dL (ref 7.0–26.0)
CO2: 26 mEq/L (ref 22–29)
Calcium: 9.3 mg/dL (ref 8.4–10.4)
Chloride: 108 mEq/L (ref 98–109)
Creatinine: 0.8 mg/dL (ref 0.6–1.1)
EGFR: 81 mL/min/{1.73_m2} — ABNORMAL LOW (ref >90)
Glucose: 103 mg/dl (ref 70–140)
Potassium: 3.8 mEq/L (ref 3.5–5.1)
Sodium: 141 mEq/L (ref 136–145)
Total Bilirubin: <0.3 mg/dL (ref 0.20–1.20)
Total Protein: 7.1 g/dL (ref 6.4–8.3)

## 2016-07-26 LAB — CBC WITH DIFFERENTIAL/PLATELET
BASO%: 1 % (ref 0.0–2.0)
Basophils Absolute: 0 10*3/uL (ref 0.0–0.1)
EOS%: 4.4 % (ref 0.0–7.0)
Eosinophils Absolute: 0.2 10*3/uL (ref 0.0–0.5)
HCT: 33.1 % — ABNORMAL LOW (ref 34.8–46.6)
HGB: 10.8 g/dL — ABNORMAL LOW (ref 11.6–15.9)
LYMPH%: 22 % (ref 14.0–49.7)
MCH: 31.6 pg (ref 25.1–34.0)
MCHC: 32.6 g/dL (ref 31.5–36.0)
MCV: 97 fL (ref 79.5–101.0)
MONO#: 0.3 10*3/uL (ref 0.1–0.9)
MONO%: 7.1 % (ref 0.0–14.0)
NEUT#: 2.8 10*3/uL (ref 1.5–6.5)
NEUT%: 65.5 % (ref 38.4–76.8)
Platelets: 271 10*3/uL (ref 145–400)
RBC: 3.41 10*6/uL — ABNORMAL LOW (ref 3.70–5.45)
RDW: 17.2 % — ABNORMAL HIGH (ref 11.2–14.5)
WBC: 4.3 10*3/uL (ref 3.9–10.3)
lymph#: 1 10*3/uL (ref 0.9–3.3)

## 2016-07-26 MED ORDER — PROCHLORPERAZINE MALEATE 10 MG PO TABS
ORAL_TABLET | ORAL | Status: AC
  Filled 2016-07-26: qty 1

## 2016-07-26 MED ORDER — HEPARIN SOD (PORK) LOCK FLUSH 100 UNIT/ML IV SOLN
500.0000 [IU] | Freq: Once | INTRAVENOUS | Status: AC | PRN
  Administered 2016-07-26: 500 [IU]
  Filled 2016-07-26: qty 5

## 2016-07-26 MED ORDER — SODIUM CHLORIDE 0.9 % IJ SOLN
10.0000 mL | INTRAMUSCULAR | Status: DC | PRN
Start: 2016-07-26 — End: 2016-07-26
  Administered 2016-07-26: 10 mL via INTRAVENOUS
  Filled 2016-07-26: qty 10

## 2016-07-26 MED ORDER — SODIUM CHLORIDE 0.9% FLUSH
10.0000 mL | INTRAVENOUS | Status: DC | PRN
  Administered 2016-07-26: 10 mL
  Filled 2016-07-26: qty 10

## 2016-07-26 MED ORDER — PACLITAXEL PROTEIN-BOUND CHEMO INJECTION 100 MG
65.0000 mg/m2 | Freq: Once | INTRAVENOUS | Status: AC
  Administered 2016-07-26: 125 mg via INTRAVENOUS
  Filled 2016-07-26: qty 25

## 2016-07-26 MED ORDER — SODIUM CHLORIDE 0.9 % IV SOLN
Freq: Once | INTRAVENOUS | Status: AC
  Administered 2016-07-26: 12:00:00 via INTRAVENOUS

## 2016-07-26 MED ORDER — PROCHLORPERAZINE MALEATE 10 MG PO TABS
10.0000 mg | ORAL_TABLET | Freq: Once | ORAL | Status: AC
  Administered 2016-07-26: 10 mg via ORAL

## 2016-07-26 MED FILL — clonazePAM 1 MG TABS: 1 | 90 days supply | Qty: 135 | Fill #0

## 2016-07-26 NOTE — Patient Instructions (Signed)
[facility] Discharge Instructions for Patients Receiving Chemotherapy  Today you received the following chemotherapy agents Abraxane  To help prevent nausea and vomiting after your treatment, we encourage you to take your nausea medication    If you develop nausea and vomiting that is not controlled by your nausea medication, call the clinic.   BELOW ARE SYMPTOMS THAT SHOULD BE REPORTED IMMEDIATELY:  *FEVER GREATER THAN 100.5 F  *CHILLS WITH OR WITHOUT FEVER  NAUSEA AND VOMITING THAT IS NOT CONTROLLED WITH YOUR NAUSEA MEDICATION  *UNUSUAL SHORTNESS OF BREATH  *UNUSUAL BRUISING OR BLEEDING  TENDERNESS IN MOUTH AND THROAT WITH OR WITHOUT PRESENCE OF ULCERS  *URINARY PROBLEMS  *BOWEL PROBLEMS  UNUSUAL RASH Items with * indicate a potential emergency and should be followed up as soon as possible.  Feel free to call the clinic you have any questions or concerns. The clinic phone number is [phone].  Please show the CHEMO ALERT CARD at check-in to the Emergency Department and triage nurse.   

## 2016-08-02 ENCOUNTER — Ambulatory Visit (HOSPITAL_BASED_OUTPATIENT_CLINIC_OR_DEPARTMENT_OTHER): Payer: 59

## 2016-08-02 ENCOUNTER — Ambulatory Visit (HOSPITAL_BASED_OUTPATIENT_CLINIC_OR_DEPARTMENT_OTHER): Payer: 59 | Admitting: Hematology and Oncology

## 2016-08-02 ENCOUNTER — Other Ambulatory Visit (HOSPITAL_BASED_OUTPATIENT_CLINIC_OR_DEPARTMENT_OTHER): Payer: 59

## 2016-08-02 ENCOUNTER — Encounter: Payer: Self-pay | Admitting: Hematology and Oncology

## 2016-08-02 ENCOUNTER — Ambulatory Visit: Payer: 59

## 2016-08-02 ENCOUNTER — Telehealth (HOSPITAL_COMMUNITY): Payer: Self-pay | Admitting: *Deleted

## 2016-08-02 DIAGNOSIS — C50412 Malignant neoplasm of upper-outer quadrant of left female breast: Secondary | ICD-10-CM

## 2016-08-02 DIAGNOSIS — Z5111 Encounter for antineoplastic chemotherapy: Secondary | ICD-10-CM | POA: Diagnosis not present

## 2016-08-02 DIAGNOSIS — R530 Neoplastic (malignant) related fatigue: Secondary | ICD-10-CM

## 2016-08-02 DIAGNOSIS — D6481 Anemia due to antineoplastic chemotherapy: Secondary | ICD-10-CM | POA: Diagnosis not present

## 2016-08-02 LAB — CBC WITH DIFFERENTIAL/PLATELET
BASO%: 1.3 % (ref 0.0–2.0)
Basophils Absolute: 0.1 10*3/uL (ref 0.0–0.1)
EOS%: 4.2 % (ref 0.0–7.0)
Eosinophils Absolute: 0.2 10*3/uL (ref 0.0–0.5)
HCT: 33.9 % — ABNORMAL LOW (ref 34.8–46.6)
HGB: 11 g/dL — ABNORMAL LOW (ref 11.6–15.9)
LYMPH%: 21.4 % (ref 14.0–49.7)
MCH: 31.7 pg (ref 25.1–34.0)
MCHC: 32.5 g/dL (ref 31.5–36.0)
MCV: 97.5 fL (ref 79.5–101.0)
MONO#: 0.3 10*3/uL (ref 0.1–0.9)
MONO%: 8.9 % (ref 0.0–14.0)
NEUT#: 2.5 10*3/uL (ref 1.5–6.5)
NEUT%: 64.2 % (ref 38.4–76.8)
Platelets: 286 10*3/uL (ref 145–400)
RBC: 3.48 10*6/uL — ABNORMAL LOW (ref 3.70–5.45)
RDW: 16 % — ABNORMAL HIGH (ref 11.2–14.5)
WBC: 3.9 10*3/uL (ref 3.9–10.3)
lymph#: 0.8 10*3/uL — ABNORMAL LOW (ref 0.9–3.3)

## 2016-08-02 LAB — COMPREHENSIVE METABOLIC PANEL
ALT: 19 U/L (ref 0–55)
AST: 16 U/L (ref 5–34)
Albumin: 3.9 g/dL (ref 3.5–5.0)
Alkaline Phosphatase: 73 U/L (ref 40–150)
Anion Gap: 8 mEq/L (ref 3–11)
BUN: 15.4 mg/dL (ref 7.0–26.0)
CO2: 27 mEq/L (ref 22–29)
Calcium: 9.5 mg/dL (ref 8.4–10.4)
Chloride: 105 mEq/L (ref 98–109)
Creatinine: 0.9 mg/dL (ref 0.6–1.1)
EGFR: 69 mL/min/{1.73_m2} — ABNORMAL LOW (ref >90)
Glucose: 94 mg/dl (ref 70–140)
Potassium: 4.2 mEq/L (ref 3.5–5.1)
Sodium: 140 mEq/L (ref 136–145)
Total Bilirubin: 0.49 mg/dL (ref 0.20–1.20)
Total Protein: 7.3 g/dL (ref 6.4–8.3)

## 2016-08-02 MED ORDER — PROCHLORPERAZINE MALEATE 10 MG PO TABS
10.0000 mg | ORAL_TABLET | Freq: Once | ORAL | Status: AC
  Administered 2016-08-02: 10 mg via ORAL

## 2016-08-02 MED ORDER — HEPARIN SOD (PORK) LOCK FLUSH 100 UNIT/ML IV SOLN
500.0000 [IU] | Freq: Once | INTRAVENOUS | Status: AC | PRN
  Administered 2016-08-02: 500 [IU]
  Filled 2016-08-02: qty 5

## 2016-08-02 MED ORDER — SODIUM CHLORIDE 0.9 % IJ SOLN
10.0000 mL | INTRAMUSCULAR | Status: DC | PRN
Start: 2016-08-02 — End: 2016-08-02
  Administered 2016-08-02: 10 mL via INTRAVENOUS
  Filled 2016-08-02: qty 10

## 2016-08-02 MED ORDER — PACLITAXEL PROTEIN-BOUND CHEMO INJECTION 100 MG
65.0000 mg/m2 | Freq: Once | INTRAVENOUS | Status: AC
  Administered 2016-08-02: 125 mg via INTRAVENOUS
  Filled 2016-08-02: qty 25

## 2016-08-02 MED ORDER — SODIUM CHLORIDE 0.9% FLUSH
10.0000 mL | INTRAVENOUS | Status: DC | PRN
Start: 2016-08-02 — End: 2016-08-02
  Administered 2016-08-02: 10 mL
  Filled 2016-08-02: qty 10

## 2016-08-02 MED ORDER — PROCHLORPERAZINE MALEATE 10 MG PO TABS
ORAL_TABLET | ORAL | Status: AC
  Filled 2016-08-02: qty 1

## 2016-08-02 MED ORDER — SODIUM CHLORIDE 0.9 % IV SOLN
Freq: Once | INTRAVENOUS | Status: AC
  Administered 2016-08-02: 12:00:00 via INTRAVENOUS

## 2016-08-02 NOTE — Progress Notes (Signed)
Patient Care Team: [name], MD as PCP - General (Internal Medicine)  DIAGNOSIS: Breast cancer of upper-outer quadrant of left female breast [facility])   Staging form: Breast, AJCC 7th Edition   - Clinical stage from 03/28/2016: Stage IIB (T2, N1, M0) - Unsigned  SUMMARY OF ONCOLOGIC HISTORY:   Breast cancer of upper-outer quadrant of left female breast ([place])   03/02/2016 Mammogram    Hypoechoic masses in the upper outer quadrant left breast 2 adjacent irregular masses 2.5-1.4 x 1.2 cm and the other 2.7 x 1.4 x 2.3 cm total span 6.4 cm, enlarged multiple axillary lymph nodes measuring 1.7 cm, T2 N1/N2 stage IIb vs IIIa      03/19/2016 Initial Diagnosis    Left breast biopsy 12:30 position: Invasive ductal carcinoma with DCIS, 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 She does not have neuropathy. She has fatigue that lasts continuously. She denies any fevers or chills. Denies any nausea vomiting. Her hair is starting to grow back. Her taste buds have recovered. Fatigue is constant but manageable.  REVIEW OF SYSTEMS:   Constitutional: Denies fevers, chills or abnormal weight loss Eyes: Denies blurriness of vision Ears, nose, mouth, throat, and face: Denies mucositis or sore throat Respiratory: Denies cough, dyspnea or wheezes Cardiovascular: Denies palpitation, chest discomfort Gastrointestinal:  Denies nausea, heartburn or change in bowel habits Skin: Denies  abnormal skin rashes Lymphatics: Denies new lymphadenopathy or easy bruising Neurological:Denies numbness, tingling or new weaknesses Behavioral/Psych: Mood is stable, no new changes  Extremities: No lower extremity edema  All other systems were reviewed with the patient and are negative.  I have reviewed the past medical history, past surgical history, social history and family history with the patient and they are unchanged from previous note.  ALLERGIES:  has No Known Allergies.  MEDICATIONS:  Current Outpatient Prescriptions  Medication Sig Dispense Refill  . Atorvastatin Calcium (INVESTIGATIONAL ATORVASTATIN/PLACEBO) 40 MG tablet [facility] [address] Take 1 tablet by mouth daily. Take 2 doses (these doses must be 12 hours apart) prior to first chemotherapy treatment. Then take 1 tablet daily by mouth with or without food. 180 tablet 0  . buPROPion (WELLBUTRIN XL) 150 MG 24 hr tablet Take 150 mg by mouth daily.  4  . cetirizine (ZYRTEC) 10 MG tablet Take 10 mg by mouth daily.    . citalopram (CELEXA) 40 MG tablet   3  . clonazePAM (KLONOPIN) 1 MG tablet Take 0.5 mg by mouth at bedtime.     Marland Kitchen loratadine (CLARITIN) 10 MG tablet Take 10 mg by mouth daily. Reported on 06/21/2016    . LORazepam (ATIVAN) 0.5 MG tablet Take 0.5 mg by mouth daily as needed. Reported on 05/14/2016  0  . nystatin-triamcinolone ointment (MYCOLOG) Apply 1 application topically 2 (two) times daily. 60 g 3  . pramipexole (MIRAPEX) 0.5 MG tablet Take 0.5 mg by mouth at bedtime.    . RESTASIS 0.05 % ophthalmic emulsion Place  1 drop into both eyes 2 (two) times daily as needed. For dry eyes.  4  . traMADol (ULTRAM) 50 MG tablet Take 1 tablet (50 mg total) by mouth every 6 (six) hours as needed. 30 tablet 1  . zolpidem (AMBIEN) 5 MG tablet Take 5 mg by mouth at bedtime.     No current facility-administered medications for this visit.    Facility-Administered Medications Ordered in Other Visits  Medication Dose Route  Frequency Provider Last Rate Last Dose  . sodium chloride flush (NS) 0.9 % injection 10 mL  10 mL Intracatheter PRN Nicholas Lose, MD   10 mL at 07/19/16 1434    PHYSICAL EXAMINATION: ECOG PERFORMANCE STATUS: 0 - Asymptomatic  Vitals:   08/02/16 1131  BP: (!) 127/54  Pulse: 86  Resp: 18  Temp: 98.4 F (36.9 C)   Filed Weights   08/02/16 1131  Weight: 195 lb 14.4 oz (88.9 kg)    GENERAL:alert, no distress and comfortable SKIN: skin color, texture, turgor are normal, no rashes or significant lesions EYES: normal, Conjunctiva are pink and non-injected, sclera clear OROPHARYNX:no exudate, no erythema and lips, buccal mucosa, and tongue normal  NECK: supple, thyroid normal size, non-tender, without nodularity LYMPH:  no palpable lymphadenopathy in the cervical, axillary or inguinal LUNGS: clear to auscultation and percussion with normal breathing effort HEART: regular rate & rhythm and no murmurs and no lower extremity edema ABDOMEN:abdomen soft, non-tender and normal bowel sounds MUSCULOSKELETAL:no cyanosis of digits and no clubbing  NEURO: alert & oriented x 3 with fluent speech, no focal motor/sensory deficits EXTREMITIES: No lower extremity edema  LABORATORY DATA:  I have reviewed the data as listed   Chemistry      Component Value Date/Time   NA 141 07/26/2016 1104   K 3.8 07/26/2016 1104   CL 108 04/07/2016 0345   CO2 26 07/26/2016 1104   BUN 15.2 07/26/2016 1104   CREATININE 0.8 07/26/2016 1104      Component Value Date/Time   CALCIUM 9.3 07/26/2016 1104   ALKPHOS 74 07/26/2016 1104   AST 18 07/26/2016 1104   ALT 25 07/26/2016 1104   BILITOT <0.30 07/26/2016 1104       Lab Results  Component Value Date   WBC 3.9 08/02/2016   HGB 11.0 (L) 08/02/2016   HCT 33.9 (L) 08/02/2016   MCV 97.5 08/02/2016   PLT 286 08/02/2016   NEUTROABS 2.5 08/02/2016     ASSESSMENT & PLAN:  Breast cancer of upper-outer quadrant of left female breast ([place]) Mammogram  03/02/2016: Hypoechoic masses in the upper outer quadrant left breast 2 adjacent irregular masses 2.5-1.4 x 1.2 cm and the other 2.7 x 1.4 x 2.3 cm total span 6.4 cm, enlarged multiple axillary lymph nodes measuring 1.7 cm, T2 N1/N2 stage IIb vs IIIa Left breast biopsy 03/19/2016 12:30 position: Invasive ductal carcinoma with DCIS, 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 Neoadjuvant chemotherapy with Adriamycin and Cytoxan dose dense 4 followed by Abraxane weekly 12 2. Followed by breast conserving surgery with sentinel lymph node study vs targeted axillary dissection 3. Followed by adjuvant radiation therapy ------------------------------------------------------------------------------------------------------------------------------------ PREVENT trial: [id number]: Patient is enrolled in this trial atorvastatin versus placebo No side effects related to the study drug. Current treatment: Completed 4 cycles of dose dense Adriamycin and Cytoxan; today is cycle 10/12 Abraxane  Chemotherapy toxicities: 1. Emergency room visit for severe restless  leg syndrome resolved after she received Dilaudid. It is felt to be related to dexamethasone. D/C dexamethasone from subsequent treatments. I also asked patient not to take oral dexamethasone. 2. Profound neutropenia (at [facility]): decreased the dose of cycle 2 AC  3. Profound fatigue  4. Grade 1-2 nausea  5. Depressive symptoms: Slight improvement than before 6. Anemia grade 1-2 chemotherapy 7. Insomnia: Her primary care physician adjusted her sleep aid and she has slept so well that she is so happy today. 9. Neutropenia on Abraxane: I reduced the dosage with cycle 4 of Abraxane and her counts have been excellent. Denies any neuropathy. Patient would like to get her labs done through the port  Monitoring closely for toxicities Return to  clinic in 2 weeks for cycle 12Abraxane Breast MRI and tumor board and follow-ups have been scheduled I will request Dr. Donne Hazel to see the patient after the breast MRI.   No orders of the defined types were placed in this encounter.  The patient has a good understanding of the overall plan. she agrees with it. she will call with any problems that may develop before the next visit here.   Rulon Eisenmenger, MD 08/02/16

## 2016-08-02 NOTE — Assessment & Plan Note (Signed)
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 Neoadjuvant chemotherapy with Adriamycin and Cytoxan dose dense 4 followed by Abraxane weekly 12 2. Followed by breast conserving surgery with sentinel lymph node study vs targeted axillary dissection 3. Followed by adjuvant radiation therapy ------------------------------------------------------------------------------------------------------------------------------------ PREVENT trial: [id number]: Patient is enrolled in this trial atorvastatin versus placebo No side effects related to the study drug. Current treatment: Completed 4 cycles of dose dense Adriamycin and Cytoxan; today is cycle 10/12 Abraxane  Chemotherapy toxicities: 1. Emergency room visit for severe restless leg syndrome resolved after she received Dilaudid. It is felt to be related to dexamethasone. D/C dexamethasone from subsequent treatments. I also asked patient not to take oral dexamethasone. 2. Profound neutropenia (at [facility]): decreased the dose of cycle 2 AC  3. Profound fatigue  4. Grade 1-2 nausea  5. Depressive symptoms: Slight improvement than before 6. Anemia grade 1-2 chemotherapy 7. Insomnia: Her primary care physician adjusted her sleep aid and she has slept so well that she is so happy today. 9. Neutropenia on Abraxane: I reduced the dosage with cycle 4 of Abraxane and her counts have been excellent. Denies any neuropathy. Patient would like to get her labs done through the port. When she returns back we will start  planning her follow-up MRIs and surgery appointments.  Monitoring closely for toxicities Return to clinic in 2 weeks for cycle 12Abraxane

## 2016-08-02 NOTE — Telephone Encounter (Signed)
Disability requested records from our office from 07/03/16-present.  Sent a fax stating we do not have any records for patient during that time period.

## 2016-08-03 ENCOUNTER — Telehealth: Payer: Self-pay

## 2016-08-07 MED FILL — ZOLPIDEM TARTRATE 5 MG TAB: 5 | 90 days supply | Qty: 90 | Fill #1

## 2016-08-09 ENCOUNTER — Ambulatory Visit (HOSPITAL_BASED_OUTPATIENT_CLINIC_OR_DEPARTMENT_OTHER): Payer: 59

## 2016-08-09 ENCOUNTER — Encounter: Payer: Self-pay | Admitting: *Deleted

## 2016-08-09 ENCOUNTER — Other Ambulatory Visit (HOSPITAL_BASED_OUTPATIENT_CLINIC_OR_DEPARTMENT_OTHER): Payer: 59

## 2016-08-09 ENCOUNTER — Ambulatory Visit: Payer: 59 | Admitting: Nurse Practitioner

## 2016-08-09 VITALS — BP 128/57 | HR 66 | Temp 98.1°F | Resp 17

## 2016-08-09 DIAGNOSIS — Z006 Encounter for examination for normal comparison and control in clinical research program: Secondary | ICD-10-CM

## 2016-08-09 DIAGNOSIS — Z5111 Encounter for antineoplastic chemotherapy: Secondary | ICD-10-CM

## 2016-08-09 DIAGNOSIS — C50412 Malignant neoplasm of upper-outer quadrant of left female breast: Secondary | ICD-10-CM | POA: Diagnosis not present

## 2016-08-09 LAB — COMPREHENSIVE METABOLIC PANEL
ALT: 13 U/L (ref 0–55)
AST: 15 U/L (ref 5–34)
Albumin: 3.6 g/dL (ref 3.5–5.0)
Alkaline Phosphatase: 67 U/L (ref 40–150)
Anion Gap: 8 mEq/L (ref 3–11)
BUN: 13.8 mg/dL (ref 7.0–26.0)
CO2: 26 mEq/L (ref 22–29)
Calcium: 9.4 mg/dL (ref 8.4–10.4)
Chloride: 107 mEq/L (ref 98–109)
Creatinine: 0.8 mg/dL (ref 0.6–1.1)
EGFR: 76 mL/min/{1.73_m2} — ABNORMAL LOW (ref >90)
Glucose: 90 mg/dl (ref 70–140)
Potassium: 4.1 mEq/L (ref 3.5–5.1)
Sodium: 141 mEq/L (ref 136–145)
Total Bilirubin: <0.3 mg/dL (ref 0.20–1.20)
Total Protein: 7 g/dL (ref 6.4–8.3)

## 2016-08-09 LAB — CBC WITH DIFFERENTIAL/PLATELET
BASO%: 0.5 % (ref 0.0–2.0)
Basophils Absolute: 0 10*3/uL (ref 0.0–0.1)
EOS%: 5 % (ref 0.0–7.0)
Eosinophils Absolute: 0.2 10*3/uL (ref 0.0–0.5)
HCT: 32.2 % — ABNORMAL LOW (ref 34.8–46.6)
HGB: 10.5 g/dL — ABNORMAL LOW (ref 11.6–15.9)
LYMPH%: 22.3 % (ref 14.0–49.7)
MCH: 31.8 pg (ref 25.1–34.0)
MCHC: 32.6 g/dL (ref 31.5–36.0)
MCV: 97.6 fL (ref 79.5–101.0)
MONO#: 0.4 10*3/uL (ref 0.1–0.9)
MONO%: 8.9 % (ref 0.0–14.0)
NEUT#: 2.6 10*3/uL (ref 1.5–6.5)
NEUT%: 63.3 % (ref 38.4–76.8)
Platelets: 273 10*3/uL (ref 145–400)
RBC: 3.3 10*6/uL — ABNORMAL LOW (ref 3.70–5.45)
RDW: 14.5 % (ref 11.2–14.5)
WBC: 4.2 10*3/uL (ref 3.9–10.3)
lymph#: 0.9 10*3/uL (ref 0.9–3.3)
nRBC: 0 % (ref 0–0)

## 2016-08-09 MED ORDER — SODIUM CHLORIDE 0.9 % IJ SOLN
10.0000 mL | INTRAMUSCULAR | Status: DC | PRN
  Administered 2016-08-09: 10 mL via INTRAVENOUS
  Filled 2016-08-09: qty 10

## 2016-08-09 MED ORDER — HEPARIN SOD (PORK) LOCK FLUSH 100 UNIT/ML IV SOLN
500.0000 [IU] | Freq: Once | INTRAVENOUS | Status: DC | PRN
  Filled 2016-08-09: qty 5

## 2016-08-09 MED ORDER — PROCHLORPERAZINE MALEATE 10 MG PO TABS
ORAL_TABLET | ORAL | Status: AC
  Filled 2016-08-09: qty 1

## 2016-08-09 MED ORDER — PROCHLORPERAZINE MALEATE 10 MG PO TABS
10.0000 mg | ORAL_TABLET | Freq: Once | ORAL | Status: AC
  Administered 2016-08-09: 10 mg via ORAL

## 2016-08-09 MED ORDER — SODIUM CHLORIDE 0.9 % IV SOLN
Freq: Once | INTRAVENOUS | Status: AC
  Administered 2016-08-09: 11:00:00 via INTRAVENOUS

## 2016-08-09 MED ORDER — SODIUM CHLORIDE 0.9% FLUSH
10.0000 mL | INTRAVENOUS | Status: DC | PRN
  Filled 2016-08-09: qty 10

## 2016-08-09 MED ORDER — PACLITAXEL PROTEIN-BOUND CHEMO INJECTION 100 MG
65.0000 mg/m2 | Freq: Once | INTRAVENOUS | Status: AC
  Administered 2016-08-09: 125 mg via INTRAVENOUS
  Filled 2016-08-09: qty 25

## 2016-08-09 NOTE — Patient Instructions (Signed)

## 2016-08-09 NOTE — Patient Instructions (Signed)
[facility] Discharge Instructions for Patients Receiving Chemotherapy  Today you received the following chemotherapy agents:  Abraxane  To help prevent nausea and vomiting after your treatment, we encourage you to take your nausea medication as ordered per MD.   If you develop nausea and vomiting that is not controlled by your nausea medication, call the clinic.   BELOW ARE SYMPTOMS THAT SHOULD BE REPORTED IMMEDIATELY:  *FEVER GREATER THAN 100.5 F  *CHILLS WITH OR WITHOUT FEVER  NAUSEA AND VOMITING THAT IS NOT CONTROLLED WITH YOUR NAUSEA MEDICATION  *UNUSUAL SHORTNESS OF BREATH  *UNUSUAL BRUISING OR BLEEDING  TENDERNESS IN MOUTH AND THROAT WITH OR WITHOUT PRESENCE OF ULCERS  *URINARY PROBLEMS  *BOWEL PROBLEMS  UNUSUAL RASH Items with * indicate a potential emergency and should be followed up as soon as possible.  Feel free to call the clinic you have any questions or concerns. The clinic phone number is [phone].  Please show the CHEMO ALERT CARD at check-in to the Emergency Department and triage nurse.   

## 2016-08-10 ENCOUNTER — Other Ambulatory Visit: Payer: Self-pay | Admitting: *Deleted

## 2016-08-10 DIAGNOSIS — C50412 Malignant neoplasm of upper-outer quadrant of left female breast: Secondary | ICD-10-CM

## 2016-08-13 ENCOUNTER — Other Ambulatory Visit: Payer: Self-pay | Admitting: Hematology and Oncology

## 2016-08-13 ENCOUNTER — Encounter: Payer: Self-pay | Admitting: Hematology and Oncology

## 2016-08-13 DIAGNOSIS — C50412 Malignant neoplasm of upper-outer quadrant of left female breast: Secondary | ICD-10-CM

## 2016-08-16 ENCOUNTER — Ambulatory Visit: Payer: 59

## 2016-08-16 ENCOUNTER — Ambulatory Visit (HOSPITAL_BASED_OUTPATIENT_CLINIC_OR_DEPARTMENT_OTHER): Payer: 59 | Admitting: Hematology and Oncology

## 2016-08-16 ENCOUNTER — Encounter: Payer: Self-pay | Admitting: *Deleted

## 2016-08-16 ENCOUNTER — Ambulatory Visit (HOSPITAL_BASED_OUTPATIENT_CLINIC_OR_DEPARTMENT_OTHER): Payer: 59

## 2016-08-16 ENCOUNTER — Encounter: Payer: Self-pay | Admitting: Hematology and Oncology

## 2016-08-16 ENCOUNTER — Other Ambulatory Visit (HOSPITAL_BASED_OUTPATIENT_CLINIC_OR_DEPARTMENT_OTHER): Payer: 59

## 2016-08-16 DIAGNOSIS — C50412 Malignant neoplasm of upper-outer quadrant of left female breast: Secondary | ICD-10-CM

## 2016-08-16 DIAGNOSIS — Z171 Estrogen receptor negative status [ER-]: Secondary | ICD-10-CM | POA: Diagnosis not present

## 2016-08-16 DIAGNOSIS — Z23 Encounter for immunization: Secondary | ICD-10-CM

## 2016-08-16 DIAGNOSIS — Z5111 Encounter for antineoplastic chemotherapy: Secondary | ICD-10-CM

## 2016-08-16 LAB — CBC WITH DIFFERENTIAL/PLATELET
BASO%: 1.4 % (ref 0.0–2.0)
Basophils Absolute: 0 10*3/uL (ref 0.0–0.1)
EOS%: 3.3 % (ref 0.0–7.0)
Eosinophils Absolute: 0.1 10*3/uL (ref 0.0–0.5)
HCT: 34 % — ABNORMAL LOW (ref 34.8–46.6)
HGB: 11.1 g/dL — ABNORMAL LOW (ref 11.6–15.9)
LYMPH%: 28.9 % (ref 14.0–49.7)
MCH: 31.9 pg (ref 25.1–34.0)
MCHC: 32.7 g/dL (ref 31.5–36.0)
MCV: 97.6 fL (ref 79.5–101.0)
MONO#: 0.2 10*3/uL (ref 0.1–0.9)
MONO%: 8.2 % (ref 0.0–14.0)
NEUT#: 1.7 10*3/uL (ref 1.5–6.5)
NEUT%: 58.2 % (ref 38.4–76.8)
Platelets: 289 10*3/uL (ref 145–400)
RBC: 3.48 10*6/uL — ABNORMAL LOW (ref 3.70–5.45)
RDW: 14.7 % — ABNORMAL HIGH (ref 11.2–14.5)
WBC: 3 10*3/uL — ABNORMAL LOW (ref 3.9–10.3)
lymph#: 0.9 10*3/uL (ref 0.9–3.3)

## 2016-08-16 LAB — COMPREHENSIVE METABOLIC PANEL
ALT: 22 U/L (ref 0–55)
AST: 19 U/L (ref 5–34)
Albumin: 3.7 g/dL (ref 3.5–5.0)
Alkaline Phosphatase: 68 U/L (ref 40–150)
Anion Gap: 7 mEq/L (ref 3–11)
BUN: 11.7 mg/dL (ref 7.0–26.0)
CO2: 26 mEq/L (ref 22–29)
Calcium: 9.3 mg/dL (ref 8.4–10.4)
Chloride: 108 mEq/L (ref 98–109)
Creatinine: 0.8 mg/dL (ref 0.6–1.1)
EGFR: 77 mL/min/{1.73_m2} — ABNORMAL LOW (ref >90)
Glucose: 91 mg/dl (ref 70–140)
Potassium: 4.2 mEq/L (ref 3.5–5.1)
Sodium: 141 mEq/L (ref 136–145)
Total Bilirubin: 0.34 mg/dL (ref 0.20–1.20)
Total Protein: 7 g/dL (ref 6.4–8.3)

## 2016-08-16 MED ORDER — PROCHLORPERAZINE MALEATE 10 MG PO TABS
ORAL_TABLET | ORAL | Status: AC
  Filled 2016-08-16: qty 1

## 2016-08-16 MED ORDER — SODIUM CHLORIDE 0.9 % IV SOLN
Freq: Once | INTRAVENOUS | Status: AC
  Administered 2016-08-16: 13:00:00 via INTRAVENOUS

## 2016-08-16 MED ORDER — PACLITAXEL PROTEIN-BOUND CHEMO INJECTION 100 MG
65.0000 mg/m2 | Freq: Once | INTRAVENOUS | Status: AC
  Administered 2016-08-16: 125 mg via INTRAVENOUS
  Filled 2016-08-16: qty 25

## 2016-08-16 MED ORDER — INFLUENZA VAC SPLIT QUAD 0.5 ML IM SUSY
0.5000 mL | PREFILLED_SYRINGE | Freq: Once | INTRAMUSCULAR | Status: AC
  Administered 2016-08-16: 0.5 mL via INTRAMUSCULAR
  Filled 2016-08-16: qty 0.5

## 2016-08-16 MED ORDER — HEPARIN SOD (PORK) LOCK FLUSH 100 UNIT/ML IV SOLN
500.0000 [IU] | Freq: Once | INTRAVENOUS | Status: AC | PRN
  Administered 2016-08-16: 500 [IU]
  Filled 2016-08-16: qty 5

## 2016-08-16 MED ORDER — SODIUM CHLORIDE 0.9 % IJ SOLN
10.0000 mL | INTRAMUSCULAR | Status: DC | PRN
  Administered 2016-08-16: 10 mL via INTRAVENOUS
  Filled 2016-08-16: qty 10

## 2016-08-16 MED ORDER — SODIUM CHLORIDE 0.9% FLUSH
10.0000 mL | INTRAVENOUS | Status: DC | PRN
  Administered 2016-08-16: 10 mL
  Filled 2016-08-16: qty 10

## 2016-08-16 MED ORDER — PROCHLORPERAZINE MALEATE 10 MG PO TABS
10.0000 mg | ORAL_TABLET | Freq: Once | ORAL | Status: AC
  Administered 2016-08-16: 10 mg via ORAL

## 2016-08-16 NOTE — Progress Notes (Signed)
08/16/16 at 11:44am - PREVENT follow up - The pt was into the cancer center today for her last chemo with Abraxane.  The pt said that she is glad that her chemo is soon to be over, but she knows that surgery is "right around the corner".  The pt was given her 6 month PREVENT appointments (cardiac MRI, research labs, nurse exam, neurocognitive testing) scheduled for 10/01/16.  The research nurse informed the pt that these appointments can be moved if needed according to her future [facility] appointments.  The pt verbalized understanding.  The pt said that she is having a MRI tomorrow, and her study team was scheduled to meet next Wednesday to discuss her surgical plans.  She said that her surgeon might refer her for the [facility] surgical study.  The research nurse will continue to monitor the pt's status.  The pt had no questions/concerns about the PREVENT study.   Brion Aliment RN, BSN, CCRP Clinical Research Nurse 08/16/2016 11:55 AM

## 2016-08-16 NOTE — Assessment & Plan Note (Signed)
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 Neoadjuvant chemotherapy with Adriamycin and Cytoxan dose dense 4 followed by Abraxane weekly 12 2. Followed by breast conserving surgery with sentinel lymph node study vs targeted axillary dissection 3. Followed by adjuvant radiation therapy ------------------------------------------------------------------------------------------------------------------------------------ PREVENT trial: [id number]: Patient is enrolled in this trial atorvastatin versus placebo No side effects related to the study drug. Current treatment: Completed 4 cycles of dose dense Adriamycin and Cytoxan; today is cycle 12/12 Abraxane  Chemotherapy toxicities: 1. Emergency room visit for severe restless leg syndrome resolved after she received Dilaudid. It is felt to be related to dexamethasone. D/C dexamethasone from subsequent treatments. I also asked patient not to take oral dexamethasone. 2. Profound neutropenia (at [facility]): decreased the dose of cycle 2 AC  3. Profound fatigue  4. Grade 1-2 nausea  5. Depressive symptoms: Slight improvement than before 6. Anemia grade 1-2 chemotherapy 7. Insomnia: Her primary care physician adjusted her sleep aid and she has slept so well that she is so happy today. 9. Neutropenia on Abraxane: I reducedthe dosage with cycle 4 of Abraxane and her counts have been excellent. Denies any neuropathy. Patient would like to get her labs done through the port  Patient will be undergoing breast MRI  tomorrow and I will see her back next Wednesday after the tumor board to discuss the results.  I briefly discussed with her the ALLIANCE clinical trial  ALLIANCE [id number]: Phase 3 clinical trial in patients with node positive disease after neoadjuvant chemotherapy, if patient is positive for sentinel lymph node determined by intraoperative pathology on final pathology if axillary lymph node dissection was not performed, randomized to axillary lymph node dissection with nodal radiation versus axillary radiation and nodal radiation  Patient stated that if the lymph nodes are positive she would prefer to undergo full axillary lymph node dissection. So she may not be a good candidate for the clinical trial.  Monitoring closely for toxicities.

## 2016-08-16 NOTE — Patient Instructions (Signed)
[facility] Discharge Instructions for Patients Receiving Chemotherapy  Today you received the following chemotherapy agents: Abraxane   To help prevent nausea and vomiting after your treatment, we encourage you to take your nausea medication as directed.    If you develop nausea and vomiting that is not controlled by your nausea medication, call the clinic.   BELOW ARE SYMPTOMS THAT SHOULD BE REPORTED IMMEDIATELY:  *FEVER GREATER THAN 100.5 F  *CHILLS WITH OR WITHOUT FEVER  NAUSEA AND VOMITING THAT IS NOT CONTROLLED WITH YOUR NAUSEA MEDICATION  *UNUSUAL SHORTNESS OF BREATH  *UNUSUAL BRUISING OR BLEEDING  TENDERNESS IN MOUTH AND THROAT WITH OR WITHOUT PRESENCE OF ULCERS  *URINARY PROBLEMS  *BOWEL PROBLEMS  UNUSUAL RASH Items with * indicate a potential emergency and should be followed up as soon as possible.  Feel free to call the clinic you have any questions or concerns. The clinic phone number is [phone].  Please show the CHEMO ALERT CARD at check-in to the Emergency Department and triage nurse.   

## 2016-08-16 NOTE — Patient Instructions (Signed)

## 2016-08-16 NOTE — Progress Notes (Signed)
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 ([place])   03/02/2016 Mammogram    Hypoechoic masses in the upper outer quadrant left breast 2 adjacent irregular masses 2.5-1.4 x 1.2 cm and the other 2.7 x 1.4 x 2.3 cm total span 6.4 cm, enlarged multiple axillary lymph nodes measuring 1.7 cm, T2 N1/N2 stage IIb vs IIIa      03/19/2016 Initial Diagnosis    Left breast biopsy 12:30 position: Invasive ductal carcinoma with DCIS, 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 She reports that she has not noticed any worsening symptoms. She continues to have some fatigue and depression symptoms. She continues to have mild neutropenia with chemotherapy. Denies any neuropathy.  REVIEW OF SYSTEMS:   Constitutional: Denies fevers, chills or abnormal weight loss Eyes: Denies blurriness of vision Ears, nose, mouth, throat, and face: Denies mucositis or sore throat Respiratory: Denies cough, dyspnea or wheezes Cardiovascular: Denies palpitation, chest discomfort Gastrointestinal:  Denies nausea, heartburn or change in bowel habits Skin: Denies abnormal skin rashes Lymphatics:  Denies new lymphadenopathy or easy bruising Neurological:Denies numbness, tingling or new weaknesses Behavioral/Psych: Mood is stable, no new changes  Extremities: No lower extremity edema Breast:  denies any pain or lumps or nodules in either breasts All other systems were reviewed with the patient and are negative.  I have reviewed the past medical history, past surgical history, social history and family history with the patient and they are unchanged from previous note.  ALLERGIES:  has No Known Allergies.  MEDICATIONS:  Current Outpatient Prescriptions  Medication Sig Dispense Refill  . Atorvastatin Calcium (INVESTIGATIONAL ATORVASTATIN/PLACEBO) 40 MG tablet [facility] [id number] Take 1 tablet by mouth daily. Take 2 doses (these doses must be 12 hours apart) prior to first chemotherapy treatment. Then take 1 tablet daily by mouth with or without food. 180 tablet 0  . buPROPion (WELLBUTRIN XL) 150 MG 24 hr tablet Take 150 mg by mouth daily.  4  . cetirizine (ZYRTEC) 10 MG tablet Take 10 mg by mouth daily.    . citalopram (CELEXA) 40 MG tablet   3  . clonazePAM (KLONOPIN) 1 MG tablet Take 1.5 mg by mouth at bedtime.     Marland Kitchen loratadine (CLARITIN) 10 MG tablet Take 10 mg by mouth daily. Reported on 06/21/2016    . LORazepam (ATIVAN) 0.5 MG tablet Take 0.5 mg by mouth daily as needed. Reported on 05/14/2016  0  . nystatin-triamcinolone ointment (MYCOLOG) Apply 1 application topically 2 (two) times daily. 60 g 3  . pramipexole (MIRAPEX) 0.5 MG tablet Take 0.5 mg by mouth at bedtime.    Marland Kitchen  RESTASIS 0.05 % ophthalmic emulsion Place 1 drop into both eyes 2 (two) times daily as needed. For dry eyes.  4  . traMADol (ULTRAM) 50 MG tablet Take 1 tablet (50 mg total) by mouth every 6 (six) hours as needed. 30 tablet 1  . zolpidem (AMBIEN) 5 MG tablet Take 5 mg by mouth at bedtime.     No current facility-administered medications for this visit.    Facility-Administered Medications Ordered in Other  Visits  Medication Dose Route Frequency Provider Last Rate Last Dose  . sodium chloride flush (NS) 0.9 % injection 10 mL  10 mL Intracatheter PRN Nicholas Lose, MD   10 mL at 07/19/16 1434    PHYSICAL EXAMINATION: ECOG PERFORMANCE STATUS: 1 - Symptomatic but completely ambulatory  Vitals:   08/16/16 1115  BP: (!) 112/56  Pulse: 75  Resp: 18  Temp: 98.5 F (36.9 C)   Filed Weights   08/16/16 1115  Weight: 198 lb 9.6 oz (90.1 kg)    GENERAL:alert, no distress and comfortable SKIN: skin color, texture, turgor are normal, no rashes or significant lesions EYES: normal, Conjunctiva are pink and non-injected, sclera clear OROPHARYNX:no exudate, no erythema and lips, buccal mucosa, and tongue normal  NECK: supple, thyroid normal size, non-tender, without nodularity LYMPH:  no palpable lymphadenopathy in the cervical, axillary or inguinal LUNGS: clear to auscultation and percussion with normal breathing effort HEART: regular rate & rhythm and no murmurs and no lower extremity edema ABDOMEN:abdomen soft, non-tender and normal bowel sounds MUSCULOSKELETAL:no cyanosis of digits and no clubbing  NEURO: alert & oriented x 3 with fluent speech, no focal motor/sensory deficits EXTREMITIES: No lower extremity edema   LABORATORY DATA:  I have reviewed the data as listed   Chemistry      Component Value Date/Time   NA 141 08/16/2016 1052   K 4.2 08/16/2016 1052   CL 108 04/07/2016 0345   CO2 26 08/16/2016 1052   BUN 11.7 08/16/2016 1052   CREATININE 0.8 08/16/2016 1052      Component Value Date/Time   CALCIUM 9.3 08/16/2016 1052   ALKPHOS 68 08/16/2016 1052   AST 19 08/16/2016 1052   ALT 22 08/16/2016 1052   BILITOT 0.34 08/16/2016 1052       Lab Results  Component Value Date   WBC 3.0 (L) 08/16/2016   HGB 11.1 (L) 08/16/2016   HCT 34.0 (L) 08/16/2016   MCV 97.6 08/16/2016   PLT 289 08/16/2016   NEUTROABS 1.7 08/16/2016     ASSESSMENT & PLAN:  Breast cancer of  upper-outer quadrant of left female breast ([place]) Mammogram 03/02/2016: Hypoechoic masses in the upper outer quadrant left breast 2 adjacent irregular masses 2.5-1.4 x 1.2 cm and the other 2.7 x 1.4 x 2.3 cm total span 6.4 cm, enlarged multiple axillary lymph nodes measuring 1.7 cm, T2 N1/N2 stage IIb vs IIIa Left breast biopsy 03/19/2016 12:30 position: Invasive ductal carcinoma with DCIS, 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 Neoadjuvant chemotherapy with Adriamycin and Cytoxan dose dense 4 followed by Abraxane weekly 12 2. Followed by breast conserving surgery with sentinel lymph node study vs targeted axillary dissection 3. Followed by adjuvant radiation therapy ------------------------------------------------------------------------------------------------------------------------------------ PREVENT trial: [id number]: Patient is enrolled in this trial atorvastatin versus placebo No side effects related to the study drug. Current treatment: Completed 4 cycles of dose dense Adriamycin and Cytoxan; today is cycle 12/12  Abraxane  Chemotherapy toxicities: 1. Emergency room visit for severe restless leg syndrome resolved after she received Dilaudid. It is felt to be related to dexamethasone. D/C dexamethasone from subsequent treatments. I also asked patient not to take oral dexamethasone. 2. Profound neutropenia (at [facility]): decreased the dose of cycle 2 AC  3. Profound fatigue  4. Grade 1-2 nausea  5. Depressive symptoms: Slight improvement than before 6. Anemia grade 1-2 chemotherapy 7. Insomnia: Her primary care physician adjusted her sleep aid and she has slept so well that she is so happy today. 9. Neutropenia on Abraxane: I reducedthe dosage with cycle 4 of Abraxane and her counts have been excellent. Denies any neuropathy. Patient would like to get her labs done through  the port  Patient will be undergoing breast MRI tomorrow and I will see her back next Wednesday after the tumor board to discuss the results.  I briefly discussed with her the ALLIANCE clinical trial  ALLIANCE [id number]: Phase 3 clinical trial in patients with node positive disease after neoadjuvant chemotherapy, if patient is positive for sentinel lymph node determined by intraoperative pathology on final pathology if axillary lymph node dissection was not performed, randomized to axillary lymph node dissection with nodal radiation versus axillary radiation and nodal radiation  Patient stated that if the lymph nodes are positive she would prefer to undergo full axillary lymph node dissection. So she may not be a good candidate for the clinical trial.  Monitoring closely for toxicities.    No orders of the defined types were placed in this encounter.  The patient has a good understanding of the overall plan. she agrees with it. she will call with any problems that may develop before the next visit here.   Rulon Eisenmenger, MD 08/16/16

## 2016-08-17 ENCOUNTER — Ambulatory Visit (HOSPITAL_COMMUNITY)
Admission: RE | Admit: 2016-08-17 | Discharge: 2016-08-17 | Disposition: A | Discharge status: Home / Self Care | Payer: 59 | Source: Ambulatory Visit | Attending: Hematology and Oncology | Admitting: Hematology and Oncology

## 2016-08-17 DIAGNOSIS — Z08 Encounter for follow-up examination after completed treatment for malignant neoplasm: Secondary | ICD-10-CM | POA: Insufficient documentation

## 2016-08-17 DIAGNOSIS — C50412 Malignant neoplasm of upper-outer quadrant of left female breast: Secondary | ICD-10-CM

## 2016-08-17 DIAGNOSIS — Z853 Personal history of malignant neoplasm of breast: Secondary | ICD-10-CM | POA: Diagnosis not present

## 2016-08-17 DIAGNOSIS — N63 Unspecified lump in breast: Secondary | ICD-10-CM | POA: Diagnosis not present

## 2016-08-17 MED ORDER — GADOBENATE DIMEGLUMINE 529 MG/ML IV SOLN
20.0000 mL | Freq: Once | INTRAVENOUS | Status: AC | PRN
  Administered 2016-08-17: 18 mL via INTRAVENOUS

## 2016-08-22 ENCOUNTER — Encounter: Payer: Self-pay | Admitting: Hematology and Oncology

## 2016-08-22 ENCOUNTER — Encounter: Payer: Self-pay | Admitting: *Deleted

## 2016-08-22 ENCOUNTER — Other Ambulatory Visit: Payer: 59

## 2016-08-22 ENCOUNTER — Ambulatory Visit (HOSPITAL_BASED_OUTPATIENT_CLINIC_OR_DEPARTMENT_OTHER): Payer: 59 | Admitting: Hematology and Oncology

## 2016-08-22 DIAGNOSIS — C50412 Malignant neoplasm of upper-outer quadrant of left female breast: Secondary | ICD-10-CM

## 2016-08-22 DIAGNOSIS — C773 Secondary and unspecified malignant neoplasm of axilla and upper limb lymph nodes: Secondary | ICD-10-CM

## 2016-08-22 NOTE — Progress Notes (Signed)
 Patient Care Team: John Griffin, MD as PCP - General (Internal Medicine) Darlene H Cooper, RN as [facility] Care Management  DIAGNOSIS: Breast cancer of upper-outer quadrant of left female breast ([facility])   Staging form: Breast, AJCC 7th Edition   - Clinical stage from 03/28/2016: Stage IIB (T2, N1, M0) - Unsigned  SUMMARY OF ONCOLOGIC HISTORY:   Breast cancer of upper-outer quadrant of left female breast ([facility])   03/02/2016 Mammogram    Hypoechoic masses in the upper outer quadrant left breast 2 adjacent irregular masses 2.5-1.4 x 1.2 cm and the other 2.7 x 1.4 x 2.3 cm total span 6.4 cm, enlarged multiple axillary lymph nodes measuring 1.7 cm, T2 N1/N2 stage IIb vs IIIa      03/19/2016 Initial Diagnosis    Left breast biopsy 12:30 position: Invasive ductal carcinoma with DCIS, 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       CHIEF COMPLIANT: Follow-up to discuss breast MRI report  INTERVAL HISTORY: Alexis Fox is a 60-year-old with above-mentioned history of left breast cancer who had completed neoadjuvant chemotherapy and is here today to discuss the results of MRI. She continues to feel fatigued. She denies any nausea vomiting.  REVIEW OF SYSTEMS:   Constitutional: Denies fevers, chills or abnormal weight loss Eyes: Denies blurriness of vision Ears, nose, mouth, throat, and face: Denies mucositis or sore throat Respiratory: Denies cough, dyspnea or wheezes Cardiovascular: Denies palpitation, chest  discomfort Gastrointestinal:  Denies nausea, heartburn or change in bowel habits Skin: Denies abnormal skin rashes Lymphatics: Denies new lymphadenopathy or easy bruising Neurological:Denies numbness, tingling or new weaknesses Behavioral/Psych: Mood is stable, no new changes  Extremities: No lower extremity edema Breast:  denies any pain or lumps or nodules in either breasts All other systems were reviewed with the patient and are negative.  I have reviewed the past medical history, past surgical history, social history and family history with the patient and they are unchanged from previous note.  ALLERGIES:  has No Known Allergies.  MEDICATIONS:  Current Outpatient Prescriptions  Medication Sig Dispense Refill  . Atorvastatin Calcium (INVESTIGATIONAL ATORVASTATIN/PLACEBO) 40 MG tablet [place] [id number] Take 1 tablet by mouth daily. Take 2 doses (these doses must be 12 hours apart) prior to first chemotherapy treatment. Then take 1 tablet daily by mouth with or without food. 180 tablet 0  . buPROPion (WELLBUTRIN XL) 150 MG 24 hr tablet Take 150 mg by mouth daily.  4  . cetirizine (ZYRTEC) 10 MG tablet Take 10 mg by mouth daily.    . citalopram (CELEXA) 40 MG tablet   3  . clonazePAM (KLONOPIN) 1 MG tablet Take 1.5 mg by mouth at bedtime.     . loratadine (CLARITIN) 10 MG tablet Take 10 mg by mouth daily. Reported on 06/21/2016    . LORazepam (ATIVAN) 0.5 MG tablet Take 0.5 mg by mouth daily as needed. Reported on 05/14/2016  0  . nystatin-triamcinolone ointment (MYCOLOG) Apply 1 application topically   2 (two) times daily. 60 g 3  . pramipexole (MIRAPEX) 0.5 MG tablet Take 0.5 mg by mouth at bedtime.    . RESTASIS 0.05 % ophthalmic emulsion Place 1 drop into both eyes 2 (two) times daily as needed. For dry eyes.  4  . traMADol (ULTRAM) 50 MG tablet Take 1 tablet (50 mg total) by mouth every 6 (six) hours as needed. 30 tablet 1  . zolpidem (AMBIEN) 5 MG tablet Take 5 mg by mouth at  bedtime.     No current facility-administered medications for this visit.    Facility-Administered Medications Ordered in Other Visits  Medication Dose Route Frequency Provider Last Rate Last Dose  . sodium chloride flush (NS) 0.9 % injection 10 mL  10 mL Intracatheter PRN  , MD   10 mL at 07/19/16 1434    PHYSICAL EXAMINATION: ECOG PERFORMANCE STATUS: 1 - Symptomatic but completely ambulatory  Vitals:   08/22/16 1047  BP: 123/69  Pulse: 75  Resp: 18  Temp: 98.4 F (36.9 C)   Filed Weights   08/22/16 1047  Weight: 197 lb 11.2 oz (89.7 kg)    GENERAL:alert, no distress and comfortable SKIN: skin color, texture, turgor are normal, no rashes or significant lesions EYES: normal, Conjunctiva are pink and non-injected, sclera clear OROPHARYNX:no exudate, no erythema and lips, buccal mucosa, and tongue normal  NECK: supple, thyroid normal size, non-tender, without nodularity LYMPH:  no palpable lymphadenopathy in the cervical, axillary or inguinal LUNGS: clear to auscultation and percussion with normal breathing effort HEART: regular rate & rhythm and no murmurs and no lower extremity edema ABDOMEN:abdomen soft, non-tender and normal bowel sounds MUSCULOSKELETAL:no cyanosis of digits and no clubbing  NEURO: alert & oriented x 3 with fluent speech, no focal motor/sensory deficits EXTREMITIES: No lower extremity edema  LABORATORY DATA:  I have reviewed the data as listed   Chemistry      Component Value Date/Time   NA 141 08/16/2016 1052   K 4.2 08/16/2016 1052   CL 108 04/07/2016 0345   CO2 26 08/16/2016 1052   BUN 11.7 08/16/2016 1052   CREATININE 0.8 08/16/2016 1052      Component Value Date/Time   CALCIUM 9.3 08/16/2016 1052   ALKPHOS 68 08/16/2016 1052   AST 19 08/16/2016 1052   ALT 22 08/16/2016 1052   BILITOT 0.34 08/16/2016 1052       Lab Results  Component Value Date   WBC 3.0 (L) 08/16/2016   HGB 11.1 (L) 08/16/2016   HCT 34.0 (L) 08/16/2016    MCV 97.6 08/16/2016   PLT 289 08/16/2016   NEUTROABS 1.7 08/16/2016     ASSESSMENT & PLAN:  Breast cancer of upper-outer quadrant of left female breast ([facility]) Mammogram 03/02/2016: Hypoechoic masses in the upper outer quadrant left breast 2 adjacent irregular masses 2.5-1.4 x 1.2 cm and the other 2.7 x 1.4 x 2.3 cm total span 6.4 cm, enlarged multiple axillary lymph nodes measuring 1.7 cm, T2 N1/N2 stage IIb vs IIIa Left breast biopsy 03/19/2016 12:30 position: Invasive ductal carcinoma with DCIS, 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 Neoadjuvant chemotherapy with Adriamycin and Cytoxan dose dense 4 followed by Abraxane weekly 12 2. Followed by breast conserving surgery with sentinel lymph node study vs targeted axillary dissection 3. Followed by adjuvant radiation therapy ------------------------------------------------------------------------------------------------------------------------------------ PREVENT trial: [id number]: Patient is enrolled in this trial atorvastatin versus placebo   No side effects related to the study drug. Current treatment: Completed 4 cycles of dose dense Adriamycin and Cytoxan; and a 12 cycles of Abraxane from 04/05/2016 to 08/16/2016  MRI breast 08/17/2016: Significant reduction in the size of the tumor from 6.3 cm to 1.9 cm with low-grade enhancement, resolution of axillary lymphadenopathy.  Tumor board recommendation: 1. Breast conserving surgery with sentinel lymph node biopsy. 2. if the sentinel lymph node biopsy is positive, patient would be randomized on Alliance clinical trial to axillary lymph node dissection versus no axillary lymph node dissection  ALLIANCE [id number]: Phase 3 clinical trial in patients with node positive disease after neoadjuvant chemotherapy, if patient is positive for sentinel lymph node determined by intraoperative  pathology or on final pathology if axillary lymph node dissection was not performed, randomized to axillary lymph node dissection with nodal radiation versus axillary radiation and nodal radiation  Return to clinic after she undergoes surgery. We may consider enrollment in SWOG 1416 clinical trial with adjuvant Pembrolizumab immunotherapy if she has significant residual disease.      No orders of the defined types were placed in this encounter.  The patient has a good understanding of the overall plan. she agrees with it. she will call with any problems that may develop before the next visit here.   Rulon Eisenmenger, MD 08/22/16

## 2016-08-22 NOTE — Assessment & Plan Note (Signed)
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 Neoadjuvant chemotherapy with Adriamycin and Cytoxan dose dense 4 followed by Abraxane weekly 12 2. Followed by breast conserving surgery with sentinel lymph node study vs targeted axillary dissection 3. Followed by adjuvant radiation therapy ------------------------------------------------------------------------------------------------------------------------------------ PREVENT trial: [id number] [id number]: Patient is enrolled in this trial atorvastatin versus placebo No side effects related to the study drug. Current treatment: Completed 4 cycles of dose dense Adriamycin and Cytoxan; and a 12 cycles of Abraxane from 04/05/2016 to 08/16/2016  MRI breast 08/17/2016: Significant reduction in the size of the tumor from 6.3 cm to 1.9 cm with low-grade enhancement, resolution of axillary lymphadenopathy.  Tumor board recommendation: 1. Breast conserving surgery with sentinel lymph node biopsy. 2. if the sentinel lymph node biopsy is positive, patient would be randomized on Alliance clinical trial to axillary lymph node dissection versus no axillary lymph node dissection  ALLIANCE [id number]: Phase 3 clinical trial in patients with node positive disease after neoadjuvant chemotherapy, if patient is positive for sentinel lymph node determined by intraoperative pathology or on final pathology if axillary lymph node dissection was not performed, randomized to axillary lymph node dissection with nodal radiation versus axillary  radiation and nodal radiation  Return to clinic after she undergoes surgery. We may consider enrollment in SWOG 1416 clinical trial with adjuvant Pembrolizumab immunotherapy if she has significant residual disease.

## 2016-08-24 ENCOUNTER — Other Ambulatory Visit: Payer: Self-pay | Admitting: General Surgery

## 2016-08-24 DIAGNOSIS — C50912 Malignant neoplasm of unspecified site of left female breast: Secondary | ICD-10-CM

## 2016-08-27 ENCOUNTER — Other Ambulatory Visit: Payer: Self-pay | Admitting: General Surgery

## 2016-08-27 DIAGNOSIS — C50912 Malignant neoplasm of unspecified site of left female breast: Secondary | ICD-10-CM

## 2016-08-28 DIAGNOSIS — Z736 Limitation of activities due to disability: Secondary | ICD-10-CM

## 2016-09-04 ENCOUNTER — Encounter (HOSPITAL_BASED_OUTPATIENT_CLINIC_OR_DEPARTMENT_OTHER): Payer: Self-pay | Admitting: *Deleted

## 2016-09-10 ENCOUNTER — Other Ambulatory Visit: Payer: Self-pay | Admitting: General Surgery

## 2016-09-10 ENCOUNTER — Ambulatory Visit
Admission: RE | Admit: 2016-09-10 | Discharge: 2016-09-10 | Disposition: A | Discharge status: Home / Self Care | Payer: 59 | Source: Ambulatory Visit | Attending: General Surgery | Admitting: General Surgery

## 2016-09-10 ENCOUNTER — Inpatient Hospital Stay: Admission: RE | Admit: 2016-09-10 | Payer: 59 | Source: Ambulatory Visit

## 2016-09-10 DIAGNOSIS — Z853 Personal history of malignant neoplasm of breast: Secondary | ICD-10-CM

## 2016-09-10 DIAGNOSIS — C50912 Malignant neoplasm of unspecified site of left female breast: Secondary | ICD-10-CM

## 2016-09-10 DIAGNOSIS — C773 Secondary and unspecified malignant neoplasm of axilla and upper limb lymph nodes: Secondary | ICD-10-CM | POA: Diagnosis not present

## 2016-09-10 DIAGNOSIS — C50412 Malignant neoplasm of upper-outer quadrant of left female breast: Secondary | ICD-10-CM | POA: Diagnosis not present

## 2016-09-12 ENCOUNTER — Ambulatory Visit (HOSPITAL_COMMUNITY)
Admission: RE | Admit: 2016-09-12 | Discharge: 2016-09-12 | Disposition: A | Discharge status: Home / Self Care | Payer: 59 | Source: Ambulatory Visit | Attending: General Surgery | Admitting: General Surgery

## 2016-09-12 ENCOUNTER — Ambulatory Visit
Admission: RE | Admit: 2016-09-12 | Discharge: 2016-09-12 | Disposition: A | Discharge status: Home / Self Care | Payer: 59 | Source: Ambulatory Visit | Attending: General Surgery | Admitting: General Surgery

## 2016-09-12 ENCOUNTER — Encounter (HOSPITAL_BASED_OUTPATIENT_CLINIC_OR_DEPARTMENT_OTHER): Payer: Self-pay | Admitting: Anesthesiology

## 2016-09-12 ENCOUNTER — Ambulatory Visit (HOSPITAL_BASED_OUTPATIENT_CLINIC_OR_DEPARTMENT_OTHER): Payer: 59 | Admitting: Anesthesiology

## 2016-09-12 ENCOUNTER — Encounter (HOSPITAL_BASED_OUTPATIENT_CLINIC_OR_DEPARTMENT_OTHER)
Admission: RE | Disposition: A | Discharge status: Home / Self Care | Payer: Self-pay | Source: Ambulatory Visit | Attending: General Surgery

## 2016-09-12 ENCOUNTER — Ambulatory Visit (HOSPITAL_BASED_OUTPATIENT_CLINIC_OR_DEPARTMENT_OTHER)
Admission: RE | Admit: 2016-09-12 | Discharge: 2016-09-12 | Disposition: A | Discharge status: Home / Self Care | Payer: 59 | Source: Ambulatory Visit | Attending: General Surgery | Admitting: General Surgery

## 2016-09-12 ENCOUNTER — Ambulatory Visit (HOSPITAL_COMMUNITY): Payer: 59

## 2016-09-12 DIAGNOSIS — Z6834 Body mass index (BMI) 34.0-34.9, adult: Secondary | ICD-10-CM | POA: Insufficient documentation

## 2016-09-12 DIAGNOSIS — Z9221 Personal history of antineoplastic chemotherapy: Secondary | ICD-10-CM | POA: Diagnosis not present

## 2016-09-12 DIAGNOSIS — N6032 Fibrosclerosis of left breast: Secondary | ICD-10-CM | POA: Diagnosis not present

## 2016-09-12 DIAGNOSIS — Z171 Estrogen receptor negative status [ER-]: Secondary | ICD-10-CM | POA: Insufficient documentation

## 2016-09-12 DIAGNOSIS — Z803 Family history of malignant neoplasm of breast: Secondary | ICD-10-CM | POA: Insufficient documentation

## 2016-09-12 DIAGNOSIS — C50912 Malignant neoplasm of unspecified site of left female breast: Secondary | ICD-10-CM | POA: Diagnosis not present

## 2016-09-12 DIAGNOSIS — C50412 Malignant neoplasm of upper-outer quadrant of left female breast: Secondary | ICD-10-CM | POA: Diagnosis not present

## 2016-09-12 DIAGNOSIS — Z808 Family history of malignant neoplasm of other organs or systems: Secondary | ICD-10-CM | POA: Diagnosis not present

## 2016-09-12 DIAGNOSIS — C773 Secondary and unspecified malignant neoplasm of axilla and upper limb lymph nodes: Secondary | ICD-10-CM | POA: Diagnosis not present

## 2016-09-12 DIAGNOSIS — D0512 Intraductal carcinoma in situ of left breast: Secondary | ICD-10-CM | POA: Diagnosis not present

## 2016-09-12 DIAGNOSIS — F329 Major depressive disorder, single episode, unspecified: Secondary | ICD-10-CM | POA: Insufficient documentation

## 2016-09-12 DIAGNOSIS — Z888 Allergy status to other drugs, medicaments and biological substances status: Secondary | ICD-10-CM | POA: Insufficient documentation

## 2016-09-12 DIAGNOSIS — G2581 Restless legs syndrome: Secondary | ICD-10-CM | POA: Diagnosis not present

## 2016-09-12 DIAGNOSIS — M170 Bilateral primary osteoarthritis of knee: Secondary | ICD-10-CM | POA: Insufficient documentation

## 2016-09-12 DIAGNOSIS — I739 Peripheral vascular disease, unspecified: Secondary | ICD-10-CM | POA: Diagnosis not present

## 2016-09-12 HISTORY — DX: Adverse effect of unspecified anesthetic, initial encounter: T41.45XA

## 2016-09-12 HISTORY — DX: Other complications of anesthesia, initial encounter: T88.59XA

## 2016-09-12 HISTORY — PX: RADIOACTIVE SEED GUIDED PARTIAL MASTECTOMY/AXILLARY SENTINEL NODE BIOPSY/AXILLARY NODE DISSECTION: SHX6491

## 2016-09-12 SURGERY — RADIOACTIVE SEED GUIDED PARTIAL MASTECTOMY WITH AXILLARY SENTINEL LYMPH NODE BIOPSY AND AXILLARY LYMPH NODE DISSECTION
Anesthesia: General | Laterality: Left

## 2016-09-12 MED ORDER — FENTANYL CITRATE (PF) 100 MCG/2ML IJ SOLN
50.0000 ug | INTRAMUSCULAR | Status: DC | PRN
  Administered 2016-09-12: 100 ug via INTRAVENOUS
  Administered 2016-09-12: 50 ug via INTRAVENOUS

## 2016-09-12 MED ORDER — FENTANYL CITRATE (PF) 100 MCG/2ML IJ SOLN
INTRAMUSCULAR | Status: AC
  Filled 2016-09-12: qty 2

## 2016-09-12 MED ORDER — HYDROMORPHONE HCL 1 MG/ML IJ SOLN
0.2500 mg | INTRAMUSCULAR | Status: DC | PRN
  Administered 2016-09-12 (×3): 0.5 mg via INTRAVENOUS

## 2016-09-12 MED ORDER — OXYCODONE HCL 5 MG PO TABS: 5.0000 mg | ORAL_TABLET | Freq: Four times a day (QID) | ORAL | 0 refills | Status: DC | PRN

## 2016-09-12 MED ORDER — ACETAMINOPHEN 500 MG PO TABS
ORAL_TABLET | ORAL | Status: AC
  Filled 2016-09-12: qty 2

## 2016-09-12 MED ORDER — SODIUM CHLORIDE 0.9 % IJ SOLN
INTRAMUSCULAR | Status: AC
  Filled 2016-09-12: qty 10

## 2016-09-12 MED ORDER — GLYCOPYRROLATE 0.2 MG/ML IJ SOLN: 0.2000 mg | Freq: Once | INTRAMUSCULAR | Status: DC | PRN

## 2016-09-12 MED ORDER — EPHEDRINE SULFATE 50 MG/ML IJ SOLN
INTRAMUSCULAR | Status: DC | PRN
  Administered 2016-09-12: 10 mg via INTRAVENOUS

## 2016-09-12 MED ORDER — OXYCODONE HCL 5 MG/5ML PO SOLN: 5.0000 mg | Freq: Once | ORAL | Status: DC | PRN

## 2016-09-12 MED ORDER — CEFAZOLIN SODIUM-DEXTROSE 2-4 GM/100ML-% IV SOLN
INTRAVENOUS | Status: AC
  Filled 2016-09-12: qty 100

## 2016-09-12 MED ORDER — OXYCODONE HCL 5 MG PO TABS: 5.0000 mg | ORAL_TABLET | Freq: Once | ORAL | Status: DC | PRN

## 2016-09-12 MED ORDER — MIDAZOLAM HCL 2 MG/2ML IJ SOLN
INTRAMUSCULAR | Status: AC
  Filled 2016-09-12: qty 2

## 2016-09-12 MED ORDER — ACETAMINOPHEN 500 MG PO TABS
1000.0000 mg | ORAL_TABLET | ORAL | Status: AC
  Administered 2016-09-12: 1000 mg via ORAL

## 2016-09-12 MED ORDER — HYDROMORPHONE HCL 1 MG/ML IJ SOLN
INTRAMUSCULAR | Status: AC
  Filled 2016-09-12: qty 1

## 2016-09-12 MED ORDER — METHYLENE BLUE 0.5 % INJ SOLN
INTRAVENOUS | Status: DC | PRN
  Administered 2016-09-12: 5 mL via INTRADERMAL

## 2016-09-12 MED ORDER — ONDANSETRON HCL 4 MG/2ML IJ SOLN: 4.0000 mg | Freq: Once | INTRAMUSCULAR | Status: DC | PRN

## 2016-09-12 MED ORDER — PROPOFOL 10 MG/ML IV BOLUS
INTRAVENOUS | Status: AC
  Filled 2016-09-12: qty 20

## 2016-09-12 MED ORDER — ONDANSETRON HCL 4 MG/2ML IJ SOLN
INTRAMUSCULAR | Status: DC | PRN
  Administered 2016-09-12: 4 mg via INTRAVENOUS

## 2016-09-12 MED ORDER — MEPERIDINE HCL 25 MG/ML IJ SOLN: 6.2500 mg | INTRAMUSCULAR | Status: DC | PRN

## 2016-09-12 MED ORDER — BUPIVACAINE HCL (PF) 0.25 % IJ SOLN
INTRAMUSCULAR | Status: DC | PRN
  Administered 2016-09-12: 7 mL

## 2016-09-12 MED ORDER — LIDOCAINE HCL (CARDIAC) 20 MG/ML IV SOLN
INTRAVENOUS | Status: DC | PRN
  Administered 2016-09-12: 30 mg via INTRAVENOUS

## 2016-09-12 MED ORDER — FENTANYL CITRATE (PF) 100 MCG/2ML IJ SOLN
INTRAMUSCULAR | Status: DC | PRN
  Administered 2016-09-12: 100 ug via INTRAVENOUS

## 2016-09-12 MED ORDER — CEFAZOLIN SODIUM-DEXTROSE 2-4 GM/100ML-% IV SOLN
2.0000 g | INTRAVENOUS | Status: AC
  Administered 2016-09-12: 2 g via INTRAVENOUS

## 2016-09-12 MED ORDER — LACTATED RINGERS IV SOLN
INTRAVENOUS | Status: DC
  Administered 2016-09-12 (×3): via INTRAVENOUS

## 2016-09-12 MED ORDER — BUPIVACAINE HCL (PF) 0.25 % IJ SOLN
INTRAMUSCULAR | Status: AC
  Filled 2016-09-12: qty 30

## 2016-09-12 MED ORDER — PROPOFOL 10 MG/ML IV BOLUS
INTRAVENOUS | Status: DC | PRN
  Administered 2016-09-12: 150 mg via INTRAVENOUS

## 2016-09-12 MED ORDER — ONDANSETRON HCL 4 MG/2ML IJ SOLN
INTRAMUSCULAR | Status: AC
  Filled 2016-09-12: qty 2

## 2016-09-12 MED ORDER — SCOPOLAMINE 1 MG/3DAYS TD PT72
MEDICATED_PATCH | TRANSDERMAL | Status: AC
  Filled 2016-09-12: qty 1

## 2016-09-12 MED ORDER — MIDAZOLAM HCL 5 MG/5ML IJ SOLN
INTRAMUSCULAR | Status: DC | PRN
  Administered 2016-09-12: 2 mg via INTRAVENOUS

## 2016-09-12 MED ORDER — GABAPENTIN 300 MG PO CAPS
ORAL_CAPSULE | ORAL | Status: AC
  Filled 2016-09-12: qty 1

## 2016-09-12 MED ORDER — GABAPENTIN 300 MG PO CAPS
300.0000 mg | ORAL_CAPSULE | ORAL | Status: AC
  Administered 2016-09-12: 300 mg via ORAL

## 2016-09-12 MED ORDER — TECHNETIUM TC 99M SULFUR COLLOID FILTERED
1.0000 | Freq: Once | INTRAVENOUS | Status: AC | PRN
  Administered 2016-09-12: 1 via INTRADERMAL

## 2016-09-12 MED ORDER — MIDAZOLAM HCL 2 MG/2ML IJ SOLN
1.0000 mg | INTRAMUSCULAR | Status: DC | PRN
  Administered 2016-09-12: 1 mg via INTRAVENOUS
  Administered 2016-09-12: 2 mg via INTRAVENOUS

## 2016-09-12 MED ORDER — DEXAMETHASONE SODIUM PHOSPHATE 10 MG/ML IJ SOLN
INTRAMUSCULAR | Status: AC
  Filled 2016-09-12: qty 1

## 2016-09-12 MED ORDER — SCOPOLAMINE 1 MG/3DAYS TD PT72
1.0000 | MEDICATED_PATCH | Freq: Once | TRANSDERMAL | Status: DC | PRN
  Administered 2016-09-12: 1.5 mg via TRANSDERMAL

## 2016-09-12 MED ORDER — LIDOCAINE 2% (20 MG/ML) 5 ML SYRINGE
INTRAMUSCULAR | Status: AC
  Filled 2016-09-12: qty 5

## 2016-09-12 MED ORDER — METHYLENE BLUE 0.5 % INJ SOLN
INTRAVENOUS | Status: AC
  Filled 2016-09-12: qty 10

## 2016-09-12 MED FILL — oxyCODONE HCL 5 MG TABS: 5 | 3 days supply | Qty: 20 | Fill #0

## 2016-09-12 SURGICAL SUPPLY — 57 items
APPLIER CLIP 9.375 MED OPEN (MISCELLANEOUS) ×2
BINDER BREAST LRG (GAUZE/BANDAGES/DRESSINGS) IMPLANT
BINDER BREAST MEDIUM (GAUZE/BANDAGES/DRESSINGS) IMPLANT
BINDER BREAST XLRG (GAUZE/BANDAGES/DRESSINGS) ×2 IMPLANT
BINDER BREAST XXLRG (GAUZE/BANDAGES/DRESSINGS) IMPLANT
BLADE SURG 15 STRL LF DISP TIS (BLADE) ×1 IMPLANT
BLADE SURG 15 STRL SS (BLADE) ×1
CANISTER SUC SOCK COL 7IN (MISCELLANEOUS) IMPLANT
CANISTER SUCT 1200ML W/VALVE (MISCELLANEOUS) ×2 IMPLANT
CHLORAPREP W/TINT 26ML (MISCELLANEOUS) ×2 IMPLANT
CLIP APPLIE 9.375 MED OPEN (MISCELLANEOUS) ×1 IMPLANT
CLIP TI WIDE RED SMALL 6 (CLIP) ×2 IMPLANT
COVER BACK TABLE 60X90IN (DRAPES) ×2 IMPLANT
COVER MAYO STAND STRL (DRAPES) ×2 IMPLANT
COVER PROBE W GEL 5X96 (DRAPES) ×2 IMPLANT
DECANTER SPIKE VIAL GLASS SM (MISCELLANEOUS) IMPLANT
DERMABOND ADVANCED (GAUZE/BANDAGES/DRESSINGS) ×1
DERMABOND ADVANCED .7 DNX12 (GAUZE/BANDAGES/DRESSINGS) ×1 IMPLANT
DEVICE DUBIN W/COMP PLATE 8390 (MISCELLANEOUS) ×2 IMPLANT
DRAPE LAPAROSCOPIC ABDOMINAL (DRAPES) ×2 IMPLANT
DRSG TEGADERM 4X4.75 (GAUZE/BANDAGES/DRESSINGS) ×2 IMPLANT
ELECT COATED BLADE 2.86 ST (ELECTRODE) ×2 IMPLANT
ELECT REM PT RETURN 9FT ADLT (ELECTROSURGICAL) ×2
ELECTRODE REM PT RTRN 9FT ADLT (ELECTROSURGICAL) ×1 IMPLANT
GLOVE BIO SURGEON STRL SZ7 (GLOVE) ×4 IMPLANT
GLOVE BIOGEL PI IND STRL 7.5 (GLOVE) ×1 IMPLANT
GLOVE BIOGEL PI INDICATOR 7.5 (GLOVE) ×1
GOWN STRL REUS W/ TWL LRG LVL3 (GOWN DISPOSABLE) ×2 IMPLANT
GOWN STRL REUS W/TWL LRG LVL3 (GOWN DISPOSABLE) ×2
HEMOSTAT ARISTA ABSORB 3G PWDR (MISCELLANEOUS) ×2 IMPLANT
ILLUMINATOR WAVEGUIDE N/F (MISCELLANEOUS) ×2 IMPLANT
KIT MARKER MARGIN INK (KITS) ×2 IMPLANT
LIGHT WAVEGUIDE WIDE FLAT (MISCELLANEOUS) IMPLANT
NEEDLE HYPO 25X1 1.5 SAFETY (NEEDLE) ×4 IMPLANT
NS IRRIG 1000ML POUR BTL (IV SOLUTION) ×2 IMPLANT
PACK BASIN DAY SURGERY FS (CUSTOM PROCEDURE TRAY) ×2 IMPLANT
PENCIL BUTTON HOLSTER BLD 10FT (ELECTRODE) ×2 IMPLANT
SHEET MEDIUM DRAPE 40X70 STRL (DRAPES) IMPLANT
SLEEVE SCD COMPRESS KNEE MED (MISCELLANEOUS) ×2 IMPLANT
SPONGE GAUZE 4X4 12PLY STER LF (GAUZE/BANDAGES/DRESSINGS) ×2 IMPLANT
SPONGE LAP 4X18 X RAY DECT (DISPOSABLE) ×4 IMPLANT
STOCKINETTE IMPERVIOUS LG (DRAPES) IMPLANT
STRIP CLOSURE SKIN 1/2X4 (GAUZE/BANDAGES/DRESSINGS) ×2 IMPLANT
SUT ETHILON 2 0 FS 18 (SUTURE) IMPLANT
SUT MNCRL AB 4-0 PS2 18 (SUTURE) ×2 IMPLANT
SUT MON AB 5-0 PS2 18 (SUTURE) IMPLANT
SUT SILK 2 0 SH (SUTURE) ×2 IMPLANT
SUT VIC AB 2-0 SH 27 (SUTURE) ×3
SUT VIC AB 2-0 SH 27XBRD (SUTURE) ×3 IMPLANT
SUT VIC AB 3-0 SH 27 (SUTURE) ×1
SUT VIC AB 3-0 SH 27X BRD (SUTURE) ×1 IMPLANT
SUT VIC AB 5-0 PS2 18 (SUTURE) IMPLANT
SYR CONTROL 10ML LL (SYRINGE) ×4 IMPLANT
TOWEL OR 17X24 6PK STRL BLUE (TOWEL DISPOSABLE) ×2 IMPLANT
TOWEL OR NON WOVEN STRL DISP B (DISPOSABLE) IMPLANT
TUBE CONNECTING 20X1/4 (TUBING) ×2 IMPLANT
YANKAUER SUCT BULB TIP NO VENT (SUCTIONS) ×2 IMPLANT

## 2016-09-12 NOTE — Anesthesia Preprocedure Evaluation (Signed)
Anesthesia Evaluation  Patient identified by MRN, date of birth, ID band Patient awake    Reviewed: Allergy & Precautions, NPO status , Patient's Chart, lab work & pertinent test results  Airway Mallampati: I  TM Distance: >3 FB Neck ROM: Full    Dental  (+) Teeth Intact, Dental Advisory Given   Pulmonary    breath sounds clear to auscultation       Cardiovascular + Peripheral Vascular Disease   Rhythm:Regular Rate:Normal     Neuro/Psych    GI/Hepatic   Endo/Other  Morbid obesity  Renal/GU      Musculoskeletal   Abdominal   Peds  Hematology   Anesthesia Other Findings   Reproductive/Obstetrics                             Anesthesia Physical Anesthesia Plan  ASA: II  Anesthesia Plan: General   Post-op Pain Management:    Induction: Intravenous  Airway Management Planned: LMA  Additional Equipment:   Intra-op Plan:   Post-operative Plan: Extubation in OR  Informed Consent: I have reviewed the patients History and Physical, chart, labs and discussed the procedure including the risks, benefits and alternatives for the proposed anesthesia with the patient or authorized representative who has indicated his/her understanding and acceptance.   Dental advisory given  Plan Discussed with: CRNA, Anesthesiologist and Surgeon  Anesthesia Plan Comments:         Anesthesia Quick Evaluation

## 2016-09-12 NOTE — Op Note (Signed)
Preoperative diagnosis: left breast cancer s/p primary chemotherapy Postoperative diagnosis: same as above Procedure: 1. Leftbreast seed guided lumpectomy  2. Left targetedaxillary node dissection (left axillary sn biopsy and left axillary node excision with seed guidance) 3. Injection blue dye for sentinel node identification Surgeon Dr Serita Grammes Anes general with pectoral block EBL: minimal Comps none Specimen:  1. Left breast marked with paint 2. Leftaxillary nodes containing seed 3. Additional posteriormarginmarked short superior, long lateral, double deep Sponge count correct at completion dispo to recovery stable  Indications: This is a 71 yof who has left breast cancer and underwent primary chemotherapy. She had positive nodes at diagnosis. Her latest mri shows cancer to be much smaller and resolution of nodes.  We discussed all options and have elected to do a targeted node dissection with lumpectomy.    Procedure: After informed consent was obtained the patient was taken to the OR. She was injected with technetium in the standard periareolar fashion. She underwent a pectoral block. She was given anitibiotics. SCDs were in place. She was prepped and draped in the standard sterile surgical fashion. A timeout was performed. I injected a mixture of methylene blue dye and saline in the periareolar area and massaged this. I then located the seed in the uoq.  I made a curvilinear incision overlying the tumor. I then removed the seed with an attempt to get a clear margin. This was passed off the table after marking with paint. I did remove additional posterior margin so this is now the muscle. The posterior margin is the muscle. I did confirm removal of theclipand seedwith mammography.I placed clips in the cavity. I used the same incision to do the nodes.   I went through the axillary fascia. I identified the targeted node with the seed. There were several other nodes  that were also removed.  There really wasn't radioactivity or blue dye present at all. I confirmed removal of the seed by mammography.  I obtained hemostasis. I then closed the fascia with 2-0 vicryl. I placed arista in the entire cavity. I closed the breast tissue with 2-0 vicryl. I closed the skin with 3-0 vicryl and 4-0 monocryl. Glue and steristrips were placed A breast binder was placed. She was extubated and transferred to recovery stable

## 2016-09-12 NOTE — H&P (Signed)
BRINKLEY PEET is an 60 y.o. female.   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 her density is b. this showed possible left breast mass. she underwent further evaluation that had [place] that showed numerous masses the largest are a 2.5x1.4x1.2 cm and 2.7x1.5x2.3 cm mass. the group of masses together measures over 6.4 cm. there are multiple nodes present with largest measuring 1.7 cm. core biopsy of node is positive. 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 she has udnergone dose dense AC followed by abraxane which she will complete on 9/14. she has done well with this. she is due for her mri on 9/15 and will be presented at conference on 9/20. Her mri shows right breast nl, normal nodes and the left breast now has a 1.9x1.3x0.5 cm area of enhancment.    Past Medical History:  Diagnosis Date  . Anxiety   . Arthritis    knees  . Breast cancer of upper-outer quadrant of left female breast ([place]) 03/20/2016  . Cholecystitis   . Complication of anesthesia    BP "crashes" post op  . Depression   . Family history of brain cancer   . Family history of breast cancer   . Hot flashes   . Restless leg syndrome     Past Surgical History:  Procedure Laterality Date  . BUNIONECTOMY    . CHOLECYSTECTOMY N/A 10/04/2015   Procedure: LAPAROSCOPIC CHOLECYSTECTOMY WITH INTRAOPERATIVE CHOLANGIOGRAM;  Surgeon: Greer Pickerel, MD;  Location: [place] [facility];  Service: General;  Laterality: N/A;  . ELBOW SURGERY     right for epicondylitis 2010   . FOOT SURGERY     1983 -tarsal tunnel release  . LUMBAR DISC SURGERY  04/04/2012  . PORTACATH PLACEMENT Right 04/02/2016   Procedure: INSERTION PORT-A-CATH WITH [place];  Surgeon: Rolm Bookbinder, MD;  Location: [facility];  Service: General;  Laterality: Right;  . SPINAL FUSION  5/12   L5-S1  . TARSAL TUNNEL RELEASE      Family History   Problem Relation Age of Onset  . Heart disease Mother   . Pulmonary fibrosis Mother   . Hypertension Mother   . Cancer Father 82    astocytoma  . Lymphoma Maternal Aunt   . Anesthesia problems Neg Hx    Social History:  reports that she has never smoked. She has never used smokeless tobacco. She reports that she does not drink alcohol or use drugs.  Allergies:  Allergies  Allergen Reactions  . Decadron [Dexamethasone]     Exacerbates restless leg    Medications Prior to Admission  Medication Sig Dispense Refill  . Atorvastatin Calcium (INVESTIGATIONAL ATORVASTATIN/PLACEBO) 40 MG tablet [facility] [id number] Take 1 tablet by mouth daily. Take 2 doses (these doses must be 12 hours apart) prior to first chemotherapy treatment. Then take 1 tablet daily by mouth with or without food. 180 tablet 0  . buPROPion (WELLBUTRIN XL) 150 MG 24 hr tablet Take 150 mg by mouth daily.  4  . cetirizine (ZYRTEC) 10 MG tablet Take 10 mg by mouth daily.    . citalopram (CELEXA) 40 MG tablet   3  . clonazePAM (KLONOPIN) 1 MG tablet Take 1.5 mg by mouth at bedtime.     Marland Kitchen nystatin-triamcinolone ointment (MYCOLOG) Apply 1 application topically 2 (two) times daily. 60 g 3  . pramipexole (MIRAPEX)  0.5 MG tablet Take 0.5 mg by mouth at bedtime.    . RESTASIS 0.05 % ophthalmic emulsion Place 1 drop into both eyes 2 (two) times daily as needed. For dry eyes.  4    No results found for this or any previous visit (from the past 48 hour(s)). Mm Lt Radioactive Seed Loc Mammo Guide  Result Date: 09/10/2016 CLINICAL DATA:  Left breast cancer was diagnosed in April 2017 following ultrasound-guided biopsy of a suspicious mass. The biopsy clip placed at the time of the biopsy was in satisfactory position. The patient has undergone neoadjuvant chemotherapy. Radioactive seed localization is requested prior to lumpectomy. EXAM: MAMMOGRAPHIC GUIDED RADIOACTIVE SEED LOCALIZATION OF THE LEFT BREAST COMPARISON:  Previous  exam(s). FINDINGS: Patient presents for radioactive seed localization prior to lumpectomy. I met with the patient and we discussed the procedure of seed localization including benefits and alternatives. We discussed the high likelihood of a successful procedure. We discussed the risks of the procedure including infection, bleeding, tissue injury and further surgery. We discussed the low dose of radioactivity involved in the procedure. Informed, written consent was given. The usual time-out protocol was performed immediately prior to the procedure. Using mammographic guidance, sterile technique, 1% lidocaine and an I-125 radioactive seed, a ribbon shaped biopsy clip in the upper-outer quadrant of the left breast was localized using a superior to inferior approach. The follow-up mammogram images confirm the seed in the expected location and were marked for Dr. Donne Hazel. Additionally, the radioactive seed placed in the left axilla (dictated separately) sees is visible in the left axilla on the whole breast mammographic views. Follow-up survey of the patient confirms presence of the radioactive seed. Order number of I-125 seed:  [id number] . Total activity: 6.433 millicuries Reference Date: 22 August 2016 The patient tolerated the procedure well and was released from the [facility]. She was given instructions regarding seed removal. IMPRESSION: Radioactive seed localization left breast. No apparent complications. Electronically Signed   By: Curlene Dolphin M.D.   On: 09/10/2016 14:40   [place] Lt Radioactive Seed Ea Add Lesion  Result Date: 09/10/2016 CLINICAL DATA:  60 year old patient diagnosed and April 2017 with left breast cancer and metastatic carcinoma to a left axillary lymph node. Radioactive seed localization of the biopsied left axillary lymph node (a HydroMARK biopsy clip was placed at the time of the axillary biopsy) is requested. The patient has undergone neoadjuvant chemotherapy. EXAM: ULTRASOUND  GUIDED RADIOACTIVE SEED LOCALIZATION OF THE LEFT AXILLA COMPARISON:  Previous exam(s). FINDINGS: Patient presents for radioactive seed localization prior to axillary surgery I met with the patient and we discussed the procedure of seed localization including benefits and alternatives. We discussed the high likelihood of a successful procedure. We discussed the risks of the procedure including infection, bleeding, tissue injury and further surgery. We discussed the low dose of radioactivity involved in the procedure. Informed, written consent was given. The usual time-out protocol was performed immediately prior to the procedure. Using ultrasound guidance, sterile technique, 1% lidocaine and an I-125 radioactive seed, a spiral shaped HydroMARK biopsy clip was localized using a medial to lateral approach. The follow-up mammogram images confirm the seed in the expected location and were marked for Dr. Donne Hazel. Follow-up survey of the patient confirms presence of the radioactive seed. Order number of I-125 seed:  [id number]. Total activity: 6.063 millicuries Reference Date: 22 August 2016 The patient tolerated the procedure well and was released from the [place]. She was given instructions regarding seed removal. IMPRESSION: Radioactive  seed localization left axilla. No apparent complications. Electronically Signed   By: Curlene Dolphin M.D.   On: 09/10/2016 14:27    ROS Negative  Blood pressure 111/80, pulse 78, temperature 99 F (37.2 C), temperature source Oral, resp. rate 18, height _0  (1.626 m), weight 91.6 kg (202 lb), SpO2 96 %. Physical Exam   Vitals Elbert Ewings CMA; 08/09/2016 4:06 PM) 08/09/2016 4:06 PM Weight: 199 lb Height: 64in Body Surface Area: 1.95 m Body Mass Index: 34.16 kg/m  Temp.: 97.80F  Pulse: 83 (Regular)  BP: 126/72 (Sitting, Left Arm, Standard) Physical Exam Rolm Bookbinder MD; 08/10/2016 1:20 PM) General Mental Status-Alert. Orientation-Oriented  X3. Breast Nipples-No Discharge. Breast Lump-No Palpable Breast Mass. Lymphatic Head & Neck General Head& Neck Lymphatics: Bilateral - Description - Normal. Axillary General Axillary Region: Bilateral - Description - Normal. Note: no Warren adenopathy  Assessment/Plan   BREAST CANCER OF UPPER-OUTER QUADRANT OF LEFT FEMALE BREAST (C50.412) Left breast seed guided lumpectomy, left targeted axillary dissection    Gustie Bobb, MD 09/12/2016, 11:08 AM

## 2016-09-12 NOTE — Anesthesia Postprocedure Evaluation (Signed)
Anesthesia Post Note  Patient: Alexis Fox  Procedure(s) Performed: Procedure(s) (LRB): LEFT BREAST SEED GUIDED LUMPECTOMY, LEFT AXILLARY SENTINEL NODE BIOPSY, LEFT SEED GUIDED AXILLARY NODE EXCISION( TARGETED AXILLARY DISSECTION), BLUE DYE INJECTION (Left)  Patient location during evaluation: PACU Anesthesia Type: General Level of consciousness: awake and alert Pain management: pain level controlled Vital Signs Assessment: post-procedure vital signs reviewed and stable Respiratory status: spontaneous breathing, nonlabored ventilation, respiratory function stable and patient connected to face mask oxygen Cardiovascular status: blood pressure returned to baseline and stable Postop Assessment: no signs of nausea or vomiting Anesthetic complications: no    Last Vitals:  Vitals:   09/12/16 1350 09/12/16 1400  BP:  102/62  Pulse: 73 73  Resp: 11 11  Temp:      Last Pain:  Vitals:   09/12/16 1350  TempSrc:   PainSc: 7                  Tadarrius Burch A

## 2016-09-12 NOTE — Discharge Instructions (Signed)
[facility] °Office Phone Number [phone] ° °POST OP INSTRUCTIONS ° °Always review your discharge instruction sheet given to you by the facility where your surgery was performed. ° °IF YOU HAVE DISABILITY OR FAMILY LEAVE FORMS, YOU MUST BRING THEM TO THE OFFICE FOR PROCESSING.  DO NOT GIVE THEM TO YOUR DOCTOR. ° °1. A prescription for pain medication may be given to you upon discharge.  Take your pain medication as prescribed, if needed.  If narcotic pain medicine is not needed, then you may take acetaminophen (Tylenol), naprosyn (Alleve) or ibuprofen (Advil) as needed. °2. Take your usually prescribed medications unless otherwise directed °3. If you need a refill on your pain medication, please contact your pharmacy.  They will contact our office to request authorization.  Prescriptions will not be filled after 5pm or on week-ends. °4. You should eat very light the first 24 hours after surgery, such as soup, crackers, pudding, etc.  Resume your normal diet the day after surgery. °5. Most patients will experience some swelling and bruising in the breast.  Ice packs and a good support bra will help.  Wear the breast binder provided or a sports bra for 72 hours day and night.  After that wear a sports bra during the day until you return to the office. Swelling and bruising can take several days to resolve.  °6. It is common to experience some constipation if taking pain medication after surgery.  Increasing fluid intake and taking a stool softener will usually help or prevent this problem from occurring.  A mild laxative (Milk of Magnesia or Miralax) should be taken according to package directions if there are no bowel movements after 48 hours. °7. Unless discharge instructions indicate otherwise, you may remove your bandages 48 hours after surgery and you may shower at that time.  You may have steri-strips (small skin tapes) in place directly over the incision.  These strips should be left on the  skin for 7-10 days and will come off on their own.  If your surgeon used skin glue on the incision, you may shower in 24 hours.  The glue will flake off over the next 2-3 weeks.  Any sutures or staples will be removed at the office during your follow-up visit. °8. ACTIVITIES:  You may resume regular daily activities (gradually increasing) beginning the next day.  Wearing a good support bra or sports bra minimizes pain and swelling.  You may have sexual intercourse when it is comfortable. °a. You may drive when you no longer are taking prescription pain medication, you can comfortably wear a seatbelt, and you can safely maneuver your car and apply brakes. °b. RETURN TO WORK:  ______________________________________________________________________________________ °9. You should see your doctor in the office for a follow-up appointment approximately two weeks after your surgery.  Your doctor’s nurse will typically make your follow-up appointment when she calls you with your pathology report.  Expect your pathology report 3-4 business days after your surgery.  You may call to check if you do not hear from us after three days. °10. OTHER INSTRUCTIONS: _______________________________________________________________________________________________ _____________________________________________________________________________________________________________________________________ °_____________________________________________________________________________________________________________________________________ °_____________________________________________________________________________________________________________________________________ ° °WHEN TO CALL DR WAKEFIELD: °1. Fever over 101.0 °2. Nausea and/or vomiting. °3. Extreme swelling or bruising. °4. Continued bleeding from incision. °5. Increased pain, redness, or drainage from the incision. ° °The clinic staff is available to answer your questions during regular  business hours.  Please don’t hesitate to call and ask to speak to one of the nurses for clinical concerns.  If   you have a medical emergency, go to the nearest emergency room or call 911.  A surgeon from [facility] is always on call at the hospital. ° °For further questions, please visit [email or web address] ° ° ° °Post Anesthesia Home Care Instructions ° °Activity: °Get plenty of rest for the remainder of the day. A responsible adult should stay with you for 24 hours following the procedure.  °For the next 24 hours, DO NOT: °-Drive a car °-Operate machinery °-Drink alcoholic beverages °-Take any medication unless instructed by your physician °-Make any legal decisions or sign important papers. ° °Meals: °Start with liquid foods such as gelatin or soup. Progress to regular foods as tolerated. Avoid greasy, spicy, heavy foods. If nausea and/or vomiting occur, drink only clear liquids until the nausea and/or vomiting subsides. Call your physician if vomiting continues. ° °Special Instructions/Symptoms: °Your throat may feel dry or sore from the anesthesia or the breathing tube placed in your throat during surgery. If this causes discomfort, gargle with warm salt water. The discomfort should disappear within 24 hours. ° °If you had a scopolamine patch placed behind your ear for the management of post- operative nausea and/or vomiting: ° °1. The medication in the patch is effective for 72 hours, after which it should be removed.  Wrap patch in a tissue and discard in the trash. Wash hands thoroughly with soap and water. °2. You may remove the patch earlier than 72 hours if you experience unpleasant side effects which may include dry mouth, dizziness or visual disturbances. °3. Avoid touching the patch. Wash your hands with soap and water after contact with the patch. °  ° °

## 2016-09-12 NOTE — Transfer of Care (Signed)
Immediate Anesthesia Transfer of Care Note  Patient: Alexis Fox  Procedure(s) Performed: Procedure(s): LEFT BREAST SEED GUIDED LUMPECTOMY, LEFT AXILLARY SENTINEL NODE BIOPSY, LEFT SEED GUIDED AXILLARY NODE EXCISION( TARGETED AXILLARY DISSECTION), BLUE DYE INJECTION (Left)  Patient Location: PACU  Anesthesia Type:GA combined with regional for post-op pain  Level of Consciousness: sedated  Airway & Oxygen Therapy: Patient Spontanous Breathing and Patient connected to face mask oxygen  Post-op Assessment: Report given to RN and Post -op Vital signs reviewed and stable  Post vital signs: Reviewed and stable  Last Vitals:  Vitals:   09/12/16 1128 09/12/16 1248  BP:  127/70  Pulse: 77 84  Resp: 14   Temp:      Last Pain:  Vitals:   09/12/16 1021  TempSrc: Oral      Patients Stated Pain Goal: 0 ([id number])  Complications: No apparent anesthesia complications

## 2016-09-12 NOTE — Interval H&P Note (Signed)
History and Physical Interval Note:  09/12/2016 11:11 AM  Alexis Fox  has presented today for surgery, with the diagnosis of LEFT BREAST CANCER  The various methods of treatment have been discussed with the patient and family. After consideration of risks, benefits and other options for treatment, the patient has consented to  Procedure(s): LEFT BREAST SEED GUIDED LUMPECTOMY, LEFT AXILLARY SENTINEL NODE BIOPSY, LEFT SEED GUIDED AXILLARY NODE EXCISION( TARGETED AXILLARY DISSECTION), BLUE DYE INJECTION (Left) as a surgical intervention .  The patient's history has been reviewed, patient examined, no change in status, stable for surgery.  I have reviewed the patient's chart and labs.  Questions were answered to the patient's satisfaction.     Harlee Pursifull

## 2016-09-12 NOTE — Progress Notes (Signed)
Emotional support during breast injections °

## 2016-09-12 NOTE — Progress Notes (Signed)
Assisted Dr. Crews with left, ultrasound guided, pectoralis block. Side rails up, monitors on throughout procedure. See vital signs in flow sheet. Tolerated Procedure well. 

## 2016-09-12 NOTE — Anesthesia Procedure Notes (Signed)
Procedure Name: LMA Insertion Date/Time: 09/12/2016 11:35 AM Performed by: Toula Moos L Pre-anesthesia Checklist: Patient identified, Emergency Drugs available, Suction available, Patient being monitored and Timeout performed Patient Re-evaluated:Patient Re-evaluated prior to inductionOxygen Delivery Method: Circle system utilized Preoxygenation: Pre-oxygenation with 100% oxygen Intubation Type: IV induction Ventilation: Mask ventilation without difficulty LMA: LMA inserted LMA Size: 4.0 Number of attempts: 1 Airway Equipment and Method: Bite block Placement Confirmation: positive ETCO2 Tube secured with: Tape Dental Injury: Teeth and Oropharynx as per pre-operative assessment

## 2016-09-13 ENCOUNTER — Encounter (HOSPITAL_BASED_OUTPATIENT_CLINIC_OR_DEPARTMENT_OTHER): Payer: Self-pay | Admitting: General Surgery

## 2016-09-17 MED FILL — CITALOPRAM HBR 40 MG TABLET: 40 | 90 days supply | Qty: 90 | Fill #3

## 2016-09-17 MED FILL — HYDROCODON-APAP 7.5-325: 7.5-325 | 5 days supply | Qty: 20 | Fill #0

## 2016-09-19 ENCOUNTER — Encounter: Payer: Self-pay | Admitting: *Deleted

## 2016-09-19 ENCOUNTER — Encounter: Payer: Self-pay | Admitting: Hematology and Oncology

## 2016-09-19 ENCOUNTER — Ambulatory Visit (HOSPITAL_BASED_OUTPATIENT_CLINIC_OR_DEPARTMENT_OTHER): Payer: 59 | Admitting: Hematology and Oncology

## 2016-09-19 DIAGNOSIS — C50412 Malignant neoplasm of upper-outer quadrant of left female breast: Secondary | ICD-10-CM | POA: Diagnosis not present

## 2016-09-19 DIAGNOSIS — Z171 Estrogen receptor negative status [ER-]: Secondary | ICD-10-CM | POA: Diagnosis not present

## 2016-09-19 DIAGNOSIS — C773 Secondary and unspecified malignant neoplasm of axilla and upper limb lymph nodes: Secondary | ICD-10-CM

## 2016-09-19 DIAGNOSIS — C50911 Malignant neoplasm of unspecified site of right female breast: Secondary | ICD-10-CM | POA: Diagnosis not present

## 2016-09-19 NOTE — Progress Notes (Signed)
Patient Care Team: [name], MD as PCP - General (Internal Medicine) [name], RN as [facility]  DIAGNOSIS:  Encounter Diagnosis  Name Primary?  . 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 She complains of intense jabs of pain at lumpectomy site.  REVIEW OF SYSTEMS:   Constitutional: Denies fevers, chills or abnormal weight loss Eyes: Denies blurriness of vision Ears, nose, mouth, throat, and face: Denies mucositis or sore throat Respiratory: Denies cough, dyspnea or wheezes Cardiovascular: Denies palpitation, chest discomfort Gastrointestinal:  Denies nausea, heartburn or change in bowel habits Skin: Denies abnormal skin rashes Lymphatics:  Denies new lymphadenopathy or easy bruising Neurological:Denies numbness, tingling or new weaknesses Behavioral/Psych: Mood is stable, no new changes  Extremities: No lower extremity edema Breast: pain at lumpectomy All other systems were reviewed with the patient and are negative.  I have reviewed the past medical history, past surgical history, social history and family history with the patient and they are unchanged from previous note.  ALLERGIES:  is allergic to decadron [dexamethasone].  MEDICATIONS:  Current Outpatient Prescriptions  Medication Sig Dispense Refill  . Atorvastatin Calcium (INVESTIGATIONAL ATORVASTATIN/PLACEBO) 40 MG tablet [facility] 35573 Take 1 tablet by mouth daily. Take 2 doses (these doses must be 12 hours apart) prior to first chemotherapy treatment. Then take 1 tablet daily by mouth with or without food. 180 tablet 0  . buPROPion (WELLBUTRIN XL) 150 MG 24 hr tablet Take 150 mg by mouth daily.  4  . cetirizine (ZYRTEC) 10 MG tablet Take 10 mg by mouth daily.    . citalopram (CELEXA) 40 MG tablet   3  . clonazePAM (KLONOPIN) 1 MG tablet Take 1.5 mg by mouth at bedtime.     Marland Kitchen nystatin-triamcinolone ointment (MYCOLOG) Apply 1 application topically 2 (two) times daily. 60 g 3  . oxyCODONE (OXY IR/ROXICODONE) 5 MG immediate release tablet Take 1-2 tablets (5-10 mg total) by mouth every 6 (six) hours as needed for moderate pain, severe pain or breakthrough pain. 20 tablet 0  . pramipexole (MIRAPEX) 0.5 MG tablet Take 0.5 mg by mouth at bedtime.    . RESTASIS 0.05 %  ophthalmic emulsion Place 1 drop into both eyes 2 (two) times daily as needed. For dry eyes.  4   No current facility-administered medications for this visit.    Facility-Administered Medications Ordered in Other Visits  Medication Dose Route Frequency Provider Last Rate Last Dose  . sodium chloride flush (NS) 0.9 % injection 10 mL  10 mL Intracatheter PRN Nicholas Lose, MD   10 mL at 07/19/16 1434     PHYSICAL EXAMINATION: ECOG PERFORMANCE STATUS: 1  Vitals:   09/19/16 1122  BP: 122/72  Pulse: 77  Resp: 18  Temp: 97.8 F (36.6 C)   Filed Weights   09/19/16 1122  Weight: 200 lb 14.4 oz (91.1 kg)    GENERAL:alert, no distress and comfortable SKIN: skin color, texture, turgor are normal, no rashes or significant lesions EYES: normal, Conjunctiva are pink and non-injected, sclera clear OROPHARYNX:no exudate, no erythema and lips, buccal mucosa, and tongue normal  NECK: supple, thyroid normal size, non-tender, without nodularity LYMPH:  no palpable lymphadenopathy in the cervical, axillary or inguinal LUNGS: clear to auscultation and percussion with normal breathing effort HEART: regular rate & rhythm and no murmurs and no lower extremity edema ABDOMEN:abdomen soft, non-tender and normal bowel sounds MUSCULOSKELETAL:no cyanosis of digits and no clubbing  NEURO: alert & oriented x 3 with fluent speech, no focal motor/sensory deficits EXTREMITIES: No lower extremity edema  LABORATORY DATA:  I have reviewed the data as listed   Chemistry      Component Value Date/Time   NA 141 08/16/2016 1052   K 4.2 08/16/2016 1052   CL 108 04/07/2016 0345   CO2 26 08/16/2016 1052   BUN 11.7 08/16/2016 1052   CREATININE 0.8 08/16/2016 1052      Component Value Date/Time   CALCIUM 9.3 08/16/2016 1052   ALKPHOS 68 08/16/2016 1052   AST 19 08/16/2016 1052   ALT 22 08/16/2016 1052   BILITOT 0.34 08/16/2016 1052       Lab Results  Component Value Date   WBC 3.0 (L) 08/16/2016   HGB 11.1 (L) 08/16/2016   HCT 34.0 (L) 08/16/2016   MCV 97.6 08/16/2016   PLT 289 08/16/2016   NEUTROABS 1.7 08/16/2016     ASSESSMENT & PLAN:  Breast cancer of upper-outer quadrant of left female breast ([place]) Mammogram 03/02/2016: Hypoechoic masses in the upper outer quadrant left breast 2 adjacent irregular masses 2.5-1.4 x 1.2 cm and the other 2.7 x 1.4 x 2.3 cm total span 6.4 cm, enlarged  multiple axillary lymph nodes measuring 1.7 cm, T2 N1/N2 stage IIb vs IIIa Left breast biopsy 03/19/2016 12:30 position: Invasive ductal carcinoma with DCIS, 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  PREVENT trial: [facility] [id number]: Patient is enrolled in this trial atorvastatin versus placebo No side effects related to the study drug.  Treatment summary:  1. Completed 4 cycles of dose dense Adriamycin and Cytoxan; and a 12 cycles of Abraxane from 04/05/2016 to 08/16/2016 2. left lumpectomy: IDC 0.5 cm, DCIS 1 cm, margins negative, 1/4 lymph nodes positive, T1aN1 stage II a pathologic stage ------------------------------------------------------------------------------------------------------------------------------------ Current treatment: After lengthy discussion with Dr. Donne Hazel, the treatment plan is to not to enroll her in the alliance clinical trial but to treat her with adjuvant radiation therapy. Patient is not eligible to participate in the adjuvant  Pembrolizumab clinical trial because she did not undergo axillary lymph node dissection.  Further adjuvant therapy: I discussed the role of Xeloda for 6 months.  CREATE-X study enrolled 910 patient's with triple negative breast cancer and residual disease after neo-adjuvant therapy. 455 patients assigned to 1250 mg/m times daily 2 weeks on 1 week off  up to 8 cycles, at 5 years PFS 74.1% with Xeloda compared to 67.7% in control arm, 30% reduction in risk, overall survival 89.2% versus 83.9%, hand-foot syndrome was seen in 72.3% with Xeloda grade 3 in 10.9%  Return to clinic after radiation is complete to discuss starting adjuvant Xeloda.  No orders of the defined types were placed in this encounter.  The patient has a good understanding  of the overall plan. she agrees with it. she will call with any problems that may develop before the next visit here.   Rulon Eisenmenger, MD 09/19/16

## 2016-09-19 NOTE — Assessment & Plan Note (Signed)
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  PREVENT trial: [facility] [id number]: Patient is enrolled in this trial atorvastatin versus placebo No side effects related to the study drug.  Treatment summary:  1. Completed 4 cycles of dose dense Adriamycin and Cytoxan; and a 12 cycles of Abraxane from 04/05/2016 to 08/16/2016 2. left lumpectomy: IDC 0.5 cm, DCIS 1 cm, margins negative, 1/4 lymph nodes positive, T1aN1 stage II a pathologic stage ------------------------------------------------------------------------------------------------------------------------------------ Current treatment: After lengthy discussion with Dr. Donne Hazel, the treatment plan is to not to enroll her in the alliance clinical trial but to treat her with adjuvant radiation therapy. Patient is not eligible to participate in the adjuvant Pembrolizumab clinical trial because she did not undergo axillary lymph node dissection.  Further adjuvant therapy: I discussed the role of Xeloda for 6 months.  CREATE-X study enrolled 910 patient's with triple negative breast cancer and residual disease after neo-adjuvant therapy. 455 patients assigned to 1250 mg/m times daily 2 weeks on 1 week off  up to 8 cycles, at 5 years PFS 74.1% with Xeloda compared to 67.7% in control arm, 30% reduction in risk, overall survival 89.2% versus 83.9%, hand-foot syndrome was seen in 72.3% with Xeloda grade 3 in  10.9%  Return to clinic after radiation is complete to discuss starting adjuvant Xeloda.

## 2016-09-20 ENCOUNTER — Encounter: Payer: Self-pay | Admitting: *Deleted

## 2016-09-21 ENCOUNTER — Telehealth: Payer: Self-pay

## 2016-09-21 NOTE — Telephone Encounter (Signed)
Faxed medical certification form to [employer]

## 2016-09-27 MED FILL — HYDROCODON-APAP 7.5-325: 7.5-325 | 5 days supply | Qty: 20 | Fill #0

## 2016-09-27 MED FILL — PRAMIPEXOLE 0.5 MG TABLET: 0.5 | 90 days supply | Qty: 90 | Fill #2

## 2016-09-28 ENCOUNTER — Telehealth: Payer: Self-pay

## 2016-09-28 NOTE — Telephone Encounter (Signed)
Spoke with patient and she is aware of all appointments on Monday 10/01/16.  She was also made aware that Lexine Baton will be out of the office and that she will meet with Barnett Applebaum.  Also advised that she may run late with [place] and that we have made Rad/Onc aware and that we will walk her over for her appointment.

## 2016-10-01 ENCOUNTER — Encounter: Payer: 59 | Admitting: *Deleted

## 2016-10-01 ENCOUNTER — Ambulatory Visit: Payer: 59

## 2016-10-01 ENCOUNTER — Ambulatory Visit
Admission: RE | Admit: 2016-10-01 | Discharge: 2016-10-01 | Disposition: A | Discharge status: Home / Self Care | Payer: 59 | Source: Ambulatory Visit | Attending: Radiation Oncology | Admitting: Radiation Oncology

## 2016-10-01 ENCOUNTER — Ambulatory Visit (HOSPITAL_COMMUNITY)
Admission: RE | Admit: 2016-10-01 | Discharge: 2016-10-01 | Disposition: A | Discharge status: Home / Self Care | Payer: 59 | Source: Ambulatory Visit | Attending: Hematology and Oncology | Admitting: Hematology and Oncology

## 2016-10-01 ENCOUNTER — Ambulatory Visit (HOSPITAL_BASED_OUTPATIENT_CLINIC_OR_DEPARTMENT_OTHER): Payer: 59

## 2016-10-01 ENCOUNTER — Telehealth: Payer: Self-pay

## 2016-10-01 ENCOUNTER — Encounter: Payer: Self-pay | Admitting: Radiation Oncology

## 2016-10-01 ENCOUNTER — Other Ambulatory Visit (HOSPITAL_BASED_OUTPATIENT_CLINIC_OR_DEPARTMENT_OTHER): Payer: 59

## 2016-10-01 ENCOUNTER — Telehealth: Payer: Self-pay | Admitting: Radiation Oncology

## 2016-10-01 VITALS — BP 116/71 | HR 74 | Resp 16 | Wt 202.0 lb

## 2016-10-01 DIAGNOSIS — C50412 Malignant neoplasm of upper-outer quadrant of left female breast: Secondary | ICD-10-CM

## 2016-10-01 DIAGNOSIS — C773 Secondary and unspecified malignant neoplasm of axilla and upper limb lymph nodes: Secondary | ICD-10-CM | POA: Insufficient documentation

## 2016-10-01 DIAGNOSIS — F419 Anxiety disorder, unspecified: Secondary | ICD-10-CM | POA: Diagnosis not present

## 2016-10-01 DIAGNOSIS — Z510 Encounter for antineoplastic radiation therapy: Secondary | ICD-10-CM | POA: Diagnosis not present

## 2016-10-01 DIAGNOSIS — F329 Major depressive disorder, single episode, unspecified: Secondary | ICD-10-CM | POA: Diagnosis not present

## 2016-10-01 DIAGNOSIS — Z8249 Family history of ischemic heart disease and other diseases of the circulatory system: Secondary | ICD-10-CM | POA: Insufficient documentation

## 2016-10-01 DIAGNOSIS — Z9221 Personal history of antineoplastic chemotherapy: Secondary | ICD-10-CM | POA: Diagnosis not present

## 2016-10-01 DIAGNOSIS — Z808 Family history of malignant neoplasm of other organs or systems: Secondary | ICD-10-CM | POA: Diagnosis not present

## 2016-10-01 DIAGNOSIS — Z888 Allergy status to other drugs, medicaments and biological substances status: Secondary | ICD-10-CM | POA: Insufficient documentation

## 2016-10-01 DIAGNOSIS — Z171 Estrogen receptor negative status [ER-]: Secondary | ICD-10-CM | POA: Diagnosis not present

## 2016-10-01 DIAGNOSIS — Z79899 Other long term (current) drug therapy: Secondary | ICD-10-CM | POA: Diagnosis not present

## 2016-10-01 DIAGNOSIS — Z803 Family history of malignant neoplasm of breast: Secondary | ICD-10-CM | POA: Insufficient documentation

## 2016-10-01 DIAGNOSIS — M179 Osteoarthritis of knee, unspecified: Secondary | ICD-10-CM | POA: Diagnosis not present

## 2016-10-01 HISTORY — DX: Malignant neoplasm of unspecified site of unspecified female breast: C50.919

## 2016-10-01 LAB — COMPREHENSIVE METABOLIC PANEL
ALT: 19 U/L (ref 0–55)
AST: 18 U/L (ref 5–34)
Albumin: 3.7 g/dL (ref 3.5–5.0)
Alkaline Phosphatase: 84 U/L (ref 40–150)
Anion Gap: 9 mEq/L (ref 3–11)
BUN: 17.6 mg/dL (ref 7.0–26.0)
CO2: 25 mEq/L (ref 22–29)
Calcium: 9.4 mg/dL (ref 8.4–10.4)
Chloride: 106 mEq/L (ref 98–109)
Creatinine: 0.8 mg/dL (ref 0.6–1.1)
EGFR: 77 mL/min/{1.73_m2} — ABNORMAL LOW (ref >90)
Glucose: 90 mg/dl (ref 70–140)
Potassium: 4.3 mEq/L (ref 3.5–5.1)
Sodium: 140 mEq/L (ref 136–145)
Total Bilirubin: 0.25 mg/dL (ref 0.20–1.20)
Total Protein: 7.2 g/dL (ref 6.4–8.3)

## 2016-10-01 LAB — RESEARCH LABS

## 2016-10-01 MED ORDER — SODIUM CHLORIDE 0.9 % IJ SOLN
10.0000 mL | INTRAMUSCULAR | Status: DC | PRN
  Administered 2016-10-01: 10 mL via INTRAVENOUS
  Filled 2016-10-01: qty 10

## 2016-10-01 MED ORDER — HEPARIN SOD (PORK) LOCK FLUSH 100 UNIT/ML IV SOLN
500.0000 [IU] | Freq: Once | INTRAVENOUS | Status: AC | PRN
  Administered 2016-10-01: 500 [IU] via INTRAVENOUS
  Filled 2016-10-01: qty 5

## 2016-10-01 NOTE — Telephone Encounter (Signed)
Patient arrived to the [facility] late today due to the MRI which caused her to miss her lab and Research nurse visit.  She was arrived to her [place] appointment as well and at that time they went ahead and saw her prior to her lab and nurse visit.  After completing her Rad Onc appointment I escorted the patient and her husband back to the lab in which she had to be stuck three times until they decided to draw from her port.  I then received a call from [facility] in the lab that 4 tubes had clotted.  The patient was still with [place] and she and Barnett Applebaum were advised.  Verdis Frederickson also stated that there was a standing cbc and cmp order.  Per Verdis Frederickson she spoke with the desk nurse whom advised that those test could wait and did not have to be drawn/processed today. Per Adonis Huguenin we did not want to [place] the patient today. The test that were not drawn for due to clotting: Frozen Plasma for storage and Renin Frozen.  A note was placed on the [facility] order and I left a message at [phone] op#3 on the general voicemail for someone to return my call regarding this issue.

## 2016-10-01 NOTE — Telephone Encounter (Signed)
Patient has not shown for 1100 appointment with Dr. Tammi Klippel. Phoned patient to inquire. No answer. Left message requesting return call.

## 2016-10-01 NOTE — Progress Notes (Signed)
See progress note under physician encounter. 

## 2016-10-01 NOTE — Progress Notes (Signed)
10/01/2016 6 month PREVENT visit  1015  This RN called MRI and spoke with Wells Guiles.  She stated the patient was almost finished with the cardiac MRI.  1110  Per Vernetta Honey, research assistant, the patient went to radiation for her nurse and MD visits before her lab work was drawn.  Lab appointment was re-scheduled to after radiation appointment per Vernetta Honey.  1330  The patient arrived in research area for 6 month PREVENT visit.  This RN introduced self and explained the 6 month visit requirements for today.  The patient confirmed she had her cardiac MRI earlier as scheduled.  During her radiation appointment, she had vital signs and wt obtained and they were WNL.   The 6 month PREVENT research labs were obtained after her radiation appointment.  The patient stated "I was stuck several times by phlebotomist and then they had to draw blood from the port to obtain the bloodwork" The patient stated she had asked them to draw blood from her port after they were not successful with peripheral stick.  She confirmed she was okay with that and had given permission.   This RN began the nurse assessment.   The patient's waist measurement was obtained, per protocol directions, and witnessed by Vernetta Honey, research assistant.  Waist measurement = 42 inches.   The RN assessment included the follow-up statin questionnaire and current medication review.   The patient confirmed her current medication list was correct except that stated she took Naproxen prn for surgical pain that her surgeon had prescribed.  Research will attempt to obtain med list from surgeon's office.  The patient was assessed for toxicities to the study drug.  The patient denied having any side effects to the study drug.  She denied abdominal problems, cramps, headache, muscle aches or other side effects.   She denied peripheral edema.  Her performance status is a 1.   She stated she is better and her energy is coming back but, she is not completely  back to normal.  The patient reported taking her study drug daily as instructed.  She said she had missed a few doses and recorded them on her study calendars.  She returned her study drug bottle, kit # [id number], with 25 pills left in the bottle.  She returned medication diaries for month of September. The patient then had 6 month neurocog. testing administered by Vernetta Honey, research assistant.  The old bottle of study drug was taken to pharmacy and the return pill count of 25 pills was counted and confirmed by Raul Del, Pharm. D.  A new bottle of study drug was dispensed by Raul Del, Pharm. D. kit # [id number].  The patient was given the new bottle of study drug.   She was given medication diaries for months Nov. 2017 through April 2018 as she is currently on Oct. 2017.  She was given self-addressed stamped envelopes for return of medication diaries.  1400 This RN was notified by Vernetta Honey, research assistant, that lab called and stated part of the research bloodwork had clotted.  Discussed above with Geophysicist/field seismologist and decision not to re-stick patient for research labs today was made.  Research to send [facility] the bloodwork that was obtained and will mark on lab form the tubes clotted. The patient denied having any questions or concerns.  This RN thanked the patient for her continued commitment to the PREVENT study. Marcellus Scott, RN, BSN, MHA, OCN

## 2016-10-01 NOTE — Progress Notes (Signed)
Confirmed with Dr. Lindi Adie that CBC was not needed today as pt has finished chemotherapy and is now seeing radiation oncology.  Information conveyed to Barnett Applebaum, RN as requested.

## 2016-10-01 NOTE — Progress Notes (Signed)
Location of Breast Cancer: Malignant neoplasm of upper-outer quadrant of left breast in female, estrogen receptor negative ([place])  Histology per Pathology Report:   Receptor Status: ER(0), PR (0), Her2-neu (NEG), Ki-(90%)  Did patient present with symptoms (if so, please note symptoms) or was this found on screening mammography?: Mammogram 03/02/2016  Past/Anticipated interventions by surgeon, if any: left lumpectomy: IDC 0.5 cm, DCIS 1 cm, margins negative, 1/4 lymph nodes positive, T1aN1 stage II a pathologic stage  Past/Anticipated interventions by medical oncology, if any: Chemotherapy: Completed 4 cycles of dose dense Adriamycin and Cytoxan; and a 12 cycles ofAbraxanefrom 04/05/2016 to 08/16/2016  Lymphedema issues, if any:  no  Pain issues, if any:  no  SAFETY ISSUES:  Prior radiation? no  Pacemaker/ICD? no  Possible current pregnancy?no  Is the patient on methotrexate? no  Current Complaints / other details: Per Dr. Geralyn Flash note:  After lengthy discussion with Dr. Donne Hazel, the treatment plan is to not to enroll her in the alliance clinical trial but to treat her with adjuvant radiation therapy. Patient is not eligible to participate in the adjuvant Pembrolizumab clinical trial because she did not undergo axillary lymph node dissection.     Jenene Slicker, RN 10/01/2016,9:42 AM

## 2016-10-01 NOTE — Progress Notes (Signed)
[place]         [phone] ________________________________  Initial Outpatient Consultation  Name: Alexis Fox MRN: [id number]  Date: 10/01/2016  DOB: 1955-12-21  REFERRING PHYSICIAN: Nicholas Lose, MD  DIAGNOSIS: The encounter diagnosis was Malignant neoplasm of upper-outer quadrant of left breast in female, estrogen receptor negative ([place]). Pathological stageT1aN1 stage IIA    ICD-9-CM ICD-10-CM   1. Malignant neoplasm of upper-outer quadrant of left breast in female, estrogen receptor negative ([facility]) 174.4 C50.412    V86.1 Z17.1     HISTORY OF PRESENT ILLNESS:Alexis Fox is a 60 y.o. female who had a screening mammogram detecting a left breast mass. Multiple lymph nodes were found to be abnormal. The large group of masses measures over 6.4 cm. 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 She underwent neo-adjuvant chemotherapy from 04/05/16-08/16/16 with dose dense Adriamycin and Cytoxan x 4 followed by Abraxane x 12; PREVENT trail (atorvastatin versus placebo). Breast MRI on 08/17/16 revealed significant reduction in the size of the tumor from 6.3 cm to 1.9 cm with low-grade enhancement, resolution of axillary lymphadenopathy. She was taken to the OR on 09/12/2016 risks and underwent a left breast CT-guided lumpectomy with left axillary lymph node biopsy. 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 Margins were negative for disease and a final posterior margin was also negative. Of the 3 lymph nodes removed, one contained metastatic carcinoma. There are no additional surgical plans at this time. She comes today to discuss the role for radiotherapy. She is not eligible for any additional post surgical clinical trials but will move forward with adjuvant xeloda for 6 months after completion of radiotherapy.    PREVIOUS  RADIATION THERAPY: No  Past Medical History:  Past Medical History:  Diagnosis Date  . Anxiety   . Arthritis    knees  . Breast cancer ([place])   . Breast cancer of upper-outer quadrant of left female breast ([facility]) 03/20/2016  . Cholecystitis   . Complication of anesthesia    BP "crashes" post op  . Depression   . Family history of brain cancer   . Family history of breast cancer   . Hot flashes   . Restless leg syndrome     Past Surgical History: Past Surgical History:  Procedure Laterality Date  . BUNIONECTOMY    . CHOLECYSTECTOMY N/A 10/04/2015   Procedure: LAPAROSCOPIC CHOLECYSTECTOMY WITH INTRAOPERATIVE CHOLANGIOGRAM;  Surgeon: Greer Pickerel, MD;  Location: [facility];  Service: General;  Laterality: N/A;  . ELBOW SURGERY     right for epicondylitis 2010   . FOOT SURGERY     1983 -tarsal tunnel release  . LUMBAR DISC SURGERY  04/04/2012  . PORTACATH PLACEMENT Right 04/02/2016   Procedure: INSERTION PORT-A-CATH WITH [place];  Surgeon: Rolm Bookbinder, MD;  Location: [facility];  Service: General;  Laterality: Right;  . RADIOACTIVE SEED GUIDED PARTIAL MASTECTOMY/AXILLARY SENTINEL NODE BIOPSY/AXILLARY NODE DISSECTION Left 09/12/2016   Procedure: LEFT BREAST SEED GUIDED LUMPECTOMY, LEFT AXILLARY SENTINEL NODE BIOPSY, LEFT SEED GUIDED AXILLARY NODE EXCISION( TARGETED AXILLARY DISSECTION), BLUE DYE INJECTION;  Surgeon: Rolm Bookbinder, MD;  Location: [facility];  Service: General;  Laterality: Left;  . SPINAL FUSION  5/12   L5-S1  . TARSAL TUNNEL RELEASE      Social History:  Social History   Social History  . Marital  status: Married    Spouse name: N/A  . Number of children: 2  . Years of education: N/A   Occupational History  . Not on file.   Social History Main Topics  . Smoking status: Never Smoker  . Smokeless tobacco: Never Used  . Alcohol use No  . Drug use: No  . Sexual activity: Yes    Birth control/ protection: Post-menopausal   Other  Topics Concern  . Not on file   Social History Narrative  . No narrative on file    Family History: Family History  Problem Relation Age of Onset  . Heart disease Mother   . Pulmonary fibrosis Mother   . Hypertension Mother   . Cancer Father 57    astocytoma  . Hypertension Brother   . Lymphoma Maternal Aunt   . Anesthesia problems Neg Hx     REVIEW OF SYSTEMS:  On review of systems, the patient reports that she is doing well since surgery and has had only mild soreness of the left breast. She denies any chest pain, shortness of breath, cough, fevers, chills, night sweats, unintended weight changes. She denies any bowel or bladder disturbances, and denies abdominal pain, nausea or vomiting. She denies any new musculoskeletal or joint aches or pains. A complete review of systems is obtained and is otherwise negative.  MEDICATIONS:  Current Outpatient Prescriptions:  .  Atorvastatin Calcium (INVESTIGATIONAL ATORVASTATIN/PLACEBO) 40 MG tablet [facility] [phone], Take 1 tablet by mouth daily. Take 2 doses (these doses must be 12 hours apart) prior to first chemotherapy treatment. Then take 1 tablet daily by mouth with or without food., Disp: 180 tablet, Rfl: 0 .  buPROPion (WELLBUTRIN XL) 150 MG 24 hr tablet, Take 150 mg by mouth daily., Disp: , Rfl: 4 .  cetirizine (ZYRTEC) 10 MG tablet, Take 10 mg by mouth daily., Disp: , Rfl:  .  citalopram (CELEXA) 40 MG tablet, , Disp: , Rfl: 3 .  clonazePAM (KLONOPIN) 1 MG tablet, Take 1.5 mg by mouth at bedtime. , Disp: , Rfl:  .  nystatin-triamcinolone ointment (MYCOLOG), Apply 1 application topically 2 (two) times daily., Disp: 60 g, Rfl: 3 .  oxyCODONE (OXY IR/ROXICODONE) 5 MG immediate release tablet, Take 1-2 tablets (5-10 mg total) by mouth every 6 (six) hours as needed for moderate pain, severe pain or breakthrough pain., Disp: 20 tablet, Rfl: 0 .  pramipexole (MIRAPEX) 0.5 MG tablet, Take 0.5 mg by mouth at bedtime., Disp: , Rfl:  .   RESTASIS 0.05 % ophthalmic emulsion, Place 1 drop into both eyes 2 (two) times daily as needed. For dry eyes., Disp: , Rfl: 4 No current facility-administered medications for this encounter.   Facility-Administered Medications Ordered in Other Encounters:  .  sodium chloride flush (NS) 0.9 % injection 10 mL, 10 mL, Intracatheter, PRN, Nicholas Lose, MD, 10 mL at 07/19/16 1434  ALLERGIES:  Allergies  Allergen Reactions  . Decadron [Dexamethasone]     Exacerbates restless leg      PHYSICAL EXAM:  Blood pressure 116/71, pulse 74, resp. rate 16, weight 202 lb (91.6 kg), SpO2 100 %. In general this is a well appearing caucasian female in no acute distress.  She's alert and oriented x4 and appropriate throughout the examination. Cardiopulmonary assessment is negative for acute distress and she exhibits normal effort. The left breast reveals postop change of her lumpectomy scar. There is no hyperpigmentation or separation or bleeding of her incision site. There is minimal ecchymosis note in  the upper outer quadrant of the left breast.   KPS = 90  100 - Normal; no complaints; no evidence of disease. 90   - Able to carry on normal activity; minor signs or symptoms of disease. 80   - Normal activity with effort; some signs or symptoms of disease. 50   - Cares for self; unable to carry on normal activity or to do active work. 60   - Requires occasional assistance, but is able to care for most of his personal needs. 50   - Requires considerable assistance and frequent medical care. 33   - Disabled; requires special care and assistance. 66   - Severely disabled; hospital admission is indicated although death not imminent. 50   - Very sick; hospital admission necessary; active supportive treatment necessary. 10   - Moribund; fatal processes progressing rapidly. 0     - Dead  Karnofsky DA, Abelmann [place], Craver LS and [facility] [phone]) The use of the nitrogen mustards in the palliative treatment of  carcinoma: with particular reference to bronchogenic carcinoma Cancer 1 [phone]  LABORATORY DATA:  Lab Results  Component Value Date   WBC 3.0 (L) 08/16/2016   HGB 11.1 (L) 08/16/2016   HCT 34.0 (L) 08/16/2016   MCV 97.6 08/16/2016   PLT 289 08/16/2016   Lab Results  Component Value Date   NA 141 08/16/2016   K 4.2 08/16/2016   CL 108 04/07/2016   CO2 26 08/16/2016   Lab Results  Component Value Date   ALT 22 08/16/2016   AST 19 08/16/2016   ALKPHOS 68 08/16/2016   BILITOT 0.34 08/16/2016     RADIOGRAPHY: Nm Sentinel Node Inj-no Rpt (breast)  Result Date: 09/12/2016 CLINICAL DATA: left axillary sn biopsy Sulfur colloid was injected intradermally by the nuclear medicine technologist for breast cancer sentinel node localization.   Mm Breast Surgical Specimen  Result Date: 09/12/2016 CLINICAL DATA:  Metastatic carcinoma to left axillary lymph node. Status post surgical excision. EXAM: SPECIMEN RADIOGRAPH OF THE LEFT BREAST COMPARISON:  Previous exam(s). FINDINGS: Status post excision of the left axilla. The radioactive seed and biopsy marker clip are present, completely intact, and were marked for pathology. IMPRESSION: Specimen radiograph of the left axilla. Electronically Signed   By: Lillia Mountain M.D.   On: 09/12/2016 12:22   Mm Breast Surgical Specimen  Result Date: 09/12/2016 CLINICAL DATA:  Biopsy proven invasive ductal carcinoma an carcinoma in-situ in the left breast. EXAM: SPECIMEN RADIOGRAPH OF THE LEFT BREAST COMPARISON:  Previous exam(s). FINDINGS: Status post excision of the left breast. The radioactive seed and biopsy marker clip are present, completely intact, and were marked for pathology. IMPRESSION: Specimen radiograph of the left breast. Electronically Signed   By: Lillia Mountain M.D.   On: 09/12/2016 12:08   Mm Lt Radioactive Seed Loc Mammo Guide  Result Date: 09/10/2016 CLINICAL DATA:  Left breast cancer was diagnosed in April 2017 following ultrasound-guided  biopsy of a suspicious mass. The biopsy clip placed at the time of the biopsy was in satisfactory position. The patient has undergone neoadjuvant chemotherapy. Radioactive seed localization is requested prior to lumpectomy. EXAM: MAMMOGRAPHIC GUIDED RADIOACTIVE SEED LOCALIZATION OF THE LEFT BREAST COMPARISON:  Previous exam(s). FINDINGS: Patient presents for radioactive seed localization prior to lumpectomy. I met with the patient and we discussed the procedure of seed localization including benefits and alternatives. We discussed the high likelihood of a successful procedure. We discussed the risks of the procedure including infection, bleeding, tissue injury  and further surgery. We discussed the low dose of radioactivity involved in the procedure. Informed, written consent was given. The usual time-out protocol was performed immediately prior to the procedure. Using mammographic guidance, sterile technique, 1% lidocaine and an I-125 radioactive seed, a ribbon shaped biopsy clip in the upper-outer quadrant of the left breast was localized using a superior to inferior approach. The follow-up mammogram images confirm the seed in the expected location and were marked for Dr. Donne Hazel. Additionally, the radioactive seed placed in the left axilla (dictated separately) sees is visible in the left axilla on the whole breast mammographic views. Follow-up survey of the patient confirms presence of the radioactive seed. Order number of I-125 seed:  [id number] . Total activity: 7.408 millicuries Reference Date: 22 August 2016 The patient tolerated the procedure well and was released from the [facility]. She was given instructions regarding seed removal. IMPRESSION: Radioactive seed localization left breast. No apparent complications. Electronically Signed   By: Curlene Dolphin M.D.   On: 09/10/2016 14:40   [place] Lt Radioactive Seed Ea Add Lesion  Result Date: 09/10/2016 CLINICAL DATA:  60 year old patient diagnosed and  April 2017 with left breast cancer and metastatic carcinoma to a left axillary lymph node. Radioactive seed localization of the biopsied left axillary lymph node (a HydroMARK biopsy clip was placed at the time of the axillary biopsy) is requested. The patient has undergone neoadjuvant chemotherapy. EXAM: ULTRASOUND GUIDED RADIOACTIVE SEED LOCALIZATION OF THE LEFT AXILLA COMPARISON:  Previous exam(s). FINDINGS: Patient presents for radioactive seed localization prior to axillary surgery I met with the patient and we discussed the procedure of seed localization including benefits and alternatives. We discussed the high likelihood of a successful procedure. We discussed the risks of the procedure including infection, bleeding, tissue injury and further surgery. We discussed the low dose of radioactivity involved in the procedure. Informed, written consent was given. The usual time-out protocol was performed immediately prior to the procedure. Using ultrasound guidance, sterile technique, 1% lidocaine and an I-125 radioactive seed, a spiral shaped HydroMARK biopsy clip was localized using a medial to lateral approach. The follow-up mammogram images confirm the seed in the expected location and were marked for Dr. Donne Hazel. Follow-up survey of the patient confirms presence of the radioactive seed. Order number of I-125 seed:  [id number]. Total activity: 1.497 millicuries Reference Date: 22 August 2016 The patient tolerated the procedure well and was released from the [facility]. She was given instructions regarding seed removal. IMPRESSION: Radioactive seed localization left axilla. No apparent complications. Electronically Signed   By: Curlene Dolphin M.D.   On: 09/10/2016 14:27      IMPRESSION:  32. 60 yo woman with triple negative,  IIA, T1aN1 invasive ductal carcinoma with DCIS of upper-outer quadrant of left breast. Dr. Tammi Klippel reviewed the the pathologic findings and work-up thus far.  We discussed the  natural history of stage II breast cancer and general treatment, highlighting the role of radiotherapy in the management.  We discussed the available radiation techniques, and focused on the details of logistics and delivery.  We reviewed the anticipated acute and late sequelae associated with radiation in this setting.  Dr. Tammi Klippel recommends proceeding with 6 1/2 weeks of radiotherapy in a total of about 33 fractions. We discussed the risks, benefits, short, and long term effects of radiotherapy, and the patient is interested in proceeding. The patient would like to proceed with radiation and will be scheduled for CT simulation next Thursday, 10/11/16.  The above documentation  reflects my direct findings during this shared patient visit. Please see the separate note by Dr. Tammi Klippel on this date for the remainder of the patient's plan of care.     Carola Rhine, PAC   This document serves as a record of services personally performed by Tyler Pita, MD. It was created on his behalf by Bethann Humble, a trained medical scribe. The creation of this record is based on the scribe's personal observations and the provider's statements to them. This document has been checked and approved by the attending provider.

## 2016-10-10 NOTE — Progress Notes (Signed)
  Radiation Oncology         ([phone]) [phone] ________________________________  Name: Alexis Fox MRN: [id number]  Date: 10/11/2016  DOB: 09-Aug-1956  SIMULATION AND TREATMENT PLANNING NOTE    ICD-9-CM ICD-10-CM   1. Malignant neoplasm of upper-outer quadrant of left breast in female, estrogen receptor negative ([place]) 174.4 C50.412    V86.1 Z17.1     DIAGNOSIS:  60 yo woman with clinical stage T2 N1-2 and post chemo stage yp T1a N1 M0 invasive ductal carcinoma of the upper-outer quadrant of left breast - stage IIA  NARRATIVE:  The patient was brought to the [place] suite.  Identity was confirmed.  All relevant records and images related to the planned course of therapy were reviewed.  The patient freely provided informed written consent to proceed with treatment after reviewing the details related to the planned course of therapy. The consent form was witnessed and verified by the simulation staff.  Then, the patient was set-up in a stable reproducible  supine position for radiation therapy.  CT images were obtained.  Surface markings were placed.  The CT images were loaded into the planning software.  Then the target and avoidance structures were contoured.  Treatment planning then occurred.  The radiation prescription was entered and confirmed.  Then, I designed and supervised the construction of a total of one medically necessary complex treatment devices.  I have requested : 3D Simulation  I have requested a DVH of the following structures: left lung, heart, and cavity.  Special treatment procedure was performed today due to the extra time and effort required by myself to plan and prepare this patient for deep inspiration breath hold technique.  I have determined cardiac sparing to be of benefit to this patient to prevent long term cardiac damage due to radiation of the heart.  Bellows were placed on the patient's abdomen. To facilitate cardiac sparing, the patient was coached by the  radiation therapists on breath hold techniques and breathing practice was performed. Practice waveforms were obtained. The patient was then scanned while maintaining breath hold in the treatment position.  This image was then transferred over to the imaging specialist. The imaging specialist then created a fusion of the free breathing and breath hold scans using the chest wall as the stable structure. I personally reviewed the fusion in axial, coronal and sagittal image planes.  Excellent cardiac sparing was obtained.  I felt the patient is an appropriate candidate for breath hold and the patient will be treated as such.  The image fusion was then reviewed with the patient to reinforce the necessity of reproducible breath hold.  PLAN:  The patient will receive 50.4 Gy in 28 fractions to the breast and regional nodes followed by boost.  ________________________________  Sheral Apley. Tammi Klippel, M.D.

## 2016-10-11 ENCOUNTER — Encounter: Payer: Self-pay | Admitting: *Deleted

## 2016-10-11 ENCOUNTER — Ambulatory Visit
Admission: RE | Admit: 2016-10-11 | Discharge: 2016-10-11 | Disposition: A | Discharge status: Home / Self Care | Payer: 59 | Source: Ambulatory Visit | Attending: Radiation Oncology | Admitting: Radiation Oncology

## 2016-10-11 DIAGNOSIS — F419 Anxiety disorder, unspecified: Secondary | ICD-10-CM | POA: Diagnosis not present

## 2016-10-11 DIAGNOSIS — Z510 Encounter for antineoplastic radiation therapy: Secondary | ICD-10-CM | POA: Diagnosis not present

## 2016-10-11 DIAGNOSIS — C50412 Malignant neoplasm of upper-outer quadrant of left female breast: Secondary | ICD-10-CM | POA: Diagnosis not present

## 2016-10-11 DIAGNOSIS — F329 Major depressive disorder, single episode, unspecified: Secondary | ICD-10-CM | POA: Diagnosis not present

## 2016-10-11 DIAGNOSIS — M179 Osteoarthritis of knee, unspecified: Secondary | ICD-10-CM | POA: Diagnosis not present

## 2016-10-11 DIAGNOSIS — Z171 Estrogen receptor negative status [ER-]: Principal | ICD-10-CM

## 2016-10-11 DIAGNOSIS — Z9221 Personal history of antineoplastic chemotherapy: Secondary | ICD-10-CM | POA: Diagnosis not present

## 2016-10-11 DIAGNOSIS — C773 Secondary and unspecified malignant neoplasm of axilla and upper limb lymph nodes: Secondary | ICD-10-CM | POA: Diagnosis not present

## 2016-10-11 DIAGNOSIS — Z79899 Other long term (current) drug therapy: Secondary | ICD-10-CM | POA: Diagnosis not present

## 2016-10-11 NOTE — Progress Notes (Signed)
10/11/16 at 1:13pm -PREVENT 6 month follow up- The pt was into the cancer center this morning for her CT simulation. The pt was contacted earlier in the week about bringing her completed October medication calendar to the clinic today.  The pt's husband said that he forgot to bring in the calendar today.  The pt was reminded to bring in the medication calendar at her next office visit.  The pt's husband said that she just missed a couple of pills in October.   Brion Aliment RN, BSN, CCRP Clinical Research Nurse 10/11/2016 1:16 PM

## 2016-10-15 MED FILL — BUPROPION HCL XL 150 MG TAB: 150 | 90 days supply | Qty: 90 | Fill #0

## 2016-10-18 ENCOUNTER — Ambulatory Visit (INDEPENDENT_AMBULATORY_CARE_PROVIDER_SITE_OTHER): Payer: Self-pay

## 2016-10-18 ENCOUNTER — Other Ambulatory Visit: Payer: Self-pay | Admitting: Nurse Practitioner

## 2016-10-18 ENCOUNTER — Encounter (INDEPENDENT_AMBULATORY_CARE_PROVIDER_SITE_OTHER): Payer: Self-pay | Admitting: Orthopaedic Surgery

## 2016-10-18 ENCOUNTER — Ambulatory Visit (INDEPENDENT_AMBULATORY_CARE_PROVIDER_SITE_OTHER): Payer: 59 | Admitting: Orthopaedic Surgery

## 2016-10-18 ENCOUNTER — Encounter: Payer: Self-pay | Admitting: Physical Therapy

## 2016-10-18 ENCOUNTER — Ambulatory Visit (HOSPITAL_BASED_OUTPATIENT_CLINIC_OR_DEPARTMENT_OTHER): Payer: 59 | Admitting: Nurse Practitioner

## 2016-10-18 ENCOUNTER — Ambulatory Visit
Admission: RE | Admit: 2016-10-18 | Discharge: 2016-10-18 | Disposition: A | Discharge status: Home / Self Care | Payer: 59 | Source: Ambulatory Visit | Attending: Radiation Oncology | Admitting: Radiation Oncology

## 2016-10-18 ENCOUNTER — Ambulatory Visit: Payer: 59 | Attending: General Surgery | Admitting: Physical Therapy

## 2016-10-18 ENCOUNTER — Telehealth: Payer: Self-pay | Admitting: Nurse Practitioner

## 2016-10-18 VITALS — BP 112/60 | HR 77 | Resp 14 | Ht 64.0 in | Wt 200.0 lb

## 2016-10-18 DIAGNOSIS — M79622 Pain in left upper arm: Secondary | ICD-10-CM | POA: Insufficient documentation

## 2016-10-18 DIAGNOSIS — G8929 Other chronic pain: Secondary | ICD-10-CM

## 2016-10-18 DIAGNOSIS — Z171 Estrogen receptor negative status [ER-]: Secondary | ICD-10-CM | POA: Diagnosis not present

## 2016-10-18 DIAGNOSIS — C773 Secondary and unspecified malignant neoplasm of axilla and upper limb lymph nodes: Secondary | ICD-10-CM | POA: Diagnosis not present

## 2016-10-18 DIAGNOSIS — T829XXA Unspecified complication of cardiac and vascular prosthetic device, implant and graft, initial encounter: Secondary | ICD-10-CM

## 2016-10-18 DIAGNOSIS — T85898A Other specified complication of other internal prosthetic devices, implants and grafts, initial encounter: Secondary | ICD-10-CM

## 2016-10-18 DIAGNOSIS — F329 Major depressive disorder, single episode, unspecified: Secondary | ICD-10-CM | POA: Diagnosis not present

## 2016-10-18 DIAGNOSIS — M25572 Pain in left ankle and joints of left foot: Secondary | ICD-10-CM | POA: Diagnosis not present

## 2016-10-18 DIAGNOSIS — R293 Abnormal posture: Secondary | ICD-10-CM | POA: Insufficient documentation

## 2016-10-18 DIAGNOSIS — C50412 Malignant neoplasm of upper-outer quadrant of left female breast: Secondary | ICD-10-CM | POA: Diagnosis not present

## 2016-10-18 DIAGNOSIS — M179 Osteoarthritis of knee, unspecified: Secondary | ICD-10-CM | POA: Diagnosis not present

## 2016-10-18 DIAGNOSIS — Z9221 Personal history of antineoplastic chemotherapy: Secondary | ICD-10-CM | POA: Diagnosis not present

## 2016-10-18 DIAGNOSIS — F419 Anxiety disorder, unspecified: Secondary | ICD-10-CM | POA: Diagnosis not present

## 2016-10-18 DIAGNOSIS — R58 Hemorrhage, not elsewhere classified: Secondary | ICD-10-CM | POA: Diagnosis not present

## 2016-10-18 DIAGNOSIS — Z79899 Other long term (current) drug therapy: Secondary | ICD-10-CM | POA: Diagnosis not present

## 2016-10-18 DIAGNOSIS — Z510 Encounter for antineoplastic radiation therapy: Secondary | ICD-10-CM | POA: Diagnosis not present

## 2016-10-18 MED FILL — HYDROCODON-APAP 7.5-325: 7.5-325 | 5 days supply | Qty: 20 | Fill #0

## 2016-10-18 NOTE — Telephone Encounter (Signed)
Patient was seen in radiation oncology; for planned.  Start her radiation treatments on Monday, 10/22/2016.  She presented to the [facility] infusion.  Desk but this afternoon stating that she just noticed that the right anterior neck has some bruising at her Port-A-Cath site.  She denies any chest pain, chest pressure, or shortness of breath.  Chills denies any known injury or trauma to the site.  Exam today reveals right upper chest Port-A-Cath intact with no evidence of trauma or infection.  The tube into the right anterior neck does have what appears to be a resolving/healing bruise.  There is questionable edema surrounding the site as well.  There are no obvious distended veins noted.  Patient will be scheduled for an ultrasound Doppler of right upper extremity tomorrow, 10/19/2016 for further evaluation.  If the Doppler ultrasound is negative-may want to consider a Port-A-Cath dye study, per interventional radiology.  Also, patient was advised to go directly to the emergency department for any worsening symptoms whatsoever.

## 2016-10-18 NOTE — Progress Notes (Deleted)
Pt is here for follow up of her left ankle. On 07/09/16  ,cortisone shot did not work, no relief.Xrays obtained 3V left ankle.

## 2016-10-18 NOTE — Therapy (Signed)
[place], [place], [address] Phone: [phone]   Fax:  [phone]  Physical Therapy Evaluation  Patient Details  Name: Alexis Fox MRN: [id number] Date of Birth: [birth year] Referring Provider: Donne Hazel  Encounter Date: 10/18/2016      PT End of Session - 10/18/16 1153    Visit Number 1   Number of Visits 5   Date for PT Re-Evaluation 11/15/16   PT Start Time 1107   PT Stop Time 1148   PT Time Calculation (min) 41 min   Activity Tolerance Patient tolerated treatment well   Behavior During Therapy [facility] for tasks assessed/performed      Past Medical History:  Diagnosis Date  . Anxiety   . Arthritis    knees  . Breast cancer ([place])   . Breast cancer of upper-outer quadrant of left female breast ([place]) 03/20/2016  . Cholecystitis   . Complication of anesthesia    BP "crashes" post op  . Depression   . Family history of brain cancer   . Family history of breast cancer   . Hot flashes   . Restless leg syndrome     Past Surgical History:  Procedure Laterality Date  . BUNIONECTOMY    . CHOLECYSTECTOMY N/A 10/04/2015   Procedure: LAPAROSCOPIC CHOLECYSTECTOMY WITH INTRAOPERATIVE CHOLANGIOGRAM;  Surgeon: Greer Pickerel, MD;  Location: [place];  Service: General;  Laterality: N/A;  . ELBOW SURGERY     right for epicondylitis 2010   . FOOT SURGERY     1983 -tarsal tunnel release  . LUMBAR DISC SURGERY  04/04/2012  . PORTACATH PLACEMENT Right 04/02/2016   Procedure: INSERTION PORT-A-CATH WITH [place];  Surgeon: Rolm Bookbinder, MD;  Location: [place];  Service: General;  Laterality: Right;  . RADIOACTIVE SEED GUIDED PARTIAL MASTECTOMY/AXILLARY SENTINEL NODE BIOPSY/AXILLARY NODE DISSECTION Left 09/12/2016   Procedure: LEFT BREAST SEED GUIDED LUMPECTOMY, LEFT AXILLARY SENTINEL NODE BIOPSY, LEFT SEED GUIDED AXILLARY NODE EXCISION( TARGETED AXILLARY DISSECTION), BLUE DYE INJECTION;  Surgeon:  Rolm Bookbinder, MD;  Location: [place];  Service: General;  Laterality: Left;  . SPINAL FUSION  5/12   L5-S1  . TARSAL TUNNEL RELEASE      There were no vitals filed for this visit.       Subjective Assessment - 10/18/16 1111    Subjective I had a lumpectomy on September 12, 2016. I am about to start radiation. I am having a lot of pain in my upper arm. It's nerve pain. I do not think I am having any swelling.   Pertinent History pt completed chemotherapy Sept 14, 2017, underwent a lumpectomy on Sep 12, 2016 and will begin radiation on 10/22/16, pt has restless leg syndrome, lumbar fusion L5-S1, depression   Currently in Pain? Yes   Pain Score 3    Pain Location Arm   Pain Orientation Left;Medial   Pain Descriptors / Indicators Tingling;Burning;Constant   Pain Type Surgical pain   Pain Onset 1 to 4 weeks ago   Aggravating Factors  friction from anything that touches it including clothing   Pain Relieving Factors pain medication   Effect of Pain on Daily Activities hinders ability to do things            [facility] PT Assessment - 10/18/16 0001      Assessment   Medical Diagnosis left breast cancer   Referring Provider Donne Hazel   Onset Date/Surgical Date 09/12/16   Hand Dominance Right   Prior  Therapy none     Precautions   Precautions None  lymphedema     Restrictions   Weight Bearing Restrictions No     Balance Screen   Has the patient fallen in the past 6 months No   Has the patient had a decrease in activity level because of a fear of falling?  No   Is the patient reluctant to leave their home because of a fear of falling?  No     Home Social worker Private residence   Living Arrangements Spouse/significant other;Children;Other relatives   Available Help at Discharge Family   Type of [place] to enter   Entrance Stairs-Number of Steps 8   Entrance Stairs-Rails Can reach both   [place] Two  level;Able to live on main level with bedroom/bathroom   Alternate Level Stairs-Number of Steps 14     Prior Function   Level of Independence Independent   Vocation On disability   Engineer, mining, outpatient diabetes education   Leisure pt does not currently exercise     Cognition   Overall Cognitive Status Within Functional Limits for tasks assessed     AROM   Overall AROM  Within functional limits for tasks performed     Strength   Overall Strength Within functional limits for tasks performed           LYMPHEDEMA/ONCOLOGY QUESTIONNAIRE - 10/18/16 1129      Type   Cancer Type left breast cancer     Surgeries   Lumpectomy Date 09/12/16   Sentinel Lymph Node Biopsy Date 09/12/16   Number Lymph Nodes Removed 4     Treatment   Active Chemotherapy Treatment No   Past Chemotherapy Treatment Yes   Active Radiation Treatment No  pt to begin on 10/22/16   Past Radiation Treatment No   Current Hormone Treatment No   Past Hormone Therapy No     What other symptoms do you have   Are you Having Heaviness or Tightness No   Are you having Pain Yes   Are you having pitting edema No   Is it Hard or Difficult finding clothes that fit No   Do you have infections No   Is there Decreased scar mobility No     Lymphedema Assessments   Lymphedema Assessments Upper extremities     Right Upper Extremity Lymphedema   10 cm Proximal to Olecranon Process 33 cm   Olecranon Process 27 cm   10 cm Proximal to Ulnar Styloid Process 22 cm   Just Proximal to Ulnar Styloid Process 16.5 cm   Across Hand at [employer] 18.8 cm   At [place] of 2nd Digit 6 cm     Left Upper Extremity Lymphedema   15 cm Proximal to Olecranon Process 35.5 cm   10 cm Proximal to Olecranon Process 32.5 cm   Olecranon Process 26 cm   15 cm Proximal to Ulnar Styloid Process 25.7 cm   10 cm Proximal to Ulnar Styloid Process 22.5 cm   Just Proximal to Ulnar Styloid Process 16.1 cm   Across Hand at  [employer] 17.6 cm   At [place] of 2nd Digit 6 cm                [facility] Adult PT Treatment/Exercise - 10/18/16 0001      Manual Therapy   Manual Therapy Neural Stretch;Other (comment)   Other Manual Therapy Instructed pt  in desensitation technique and cut pt a small and medium arm sleeve out of TG soft to help desensitize nerve pain in left upper arm   Neural Stretch instructed pt in neural stretches including median and ulnar nerve stretches, pt only felt a stretch with median nerve stretch and was educated to perform this at home                 PT Education - 10/18/16 1153    Education provided Yes   Education Details desensitization techniques. wearing TG soft to help reduce arm pain, lymphedema risk reduction practices, nerve stretches   Person(s) Educated Patient;Spouse   Methods Explanation;Demonstration;Handout   Comprehension Verbalized understanding;Returned demonstration              Breast Clinic Goals - 03/28/16 1213      Patient will be able to verbalize understanding of pertinent lymphedema risk reduction practices relevant to her diagnosis specifically related to skin care.   Time 1   Period Days   Status Achieved     Patient will be able to return demonstrate and/or verbalize understanding of the post-op home exercise program related to regaining shoulder range of motion.   Time 1   Period Days   Status Achieved     Patient will be able to verbalize understanding of the importance of attending the postoperative After Breast Cancer Class for further lymphedema risk reduction education and therapeutic exercise.   Time 1   Period Days   Status Achieved          Long Term Clinic Goals - 10/18/16 1202      CC Long Term Goal  #1   Title Pt will report a 75% improvement in left upper arm pain to allow increased comfort.   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 Pt had positve axillary lymph nodes. She underwent a lumpectomy with lymph node biopsy (4 nodes). She has completed chemotherapy in Sept 2017 and will begin radiation on 10/22/16. Patient has [facility] bilateral shoulder ROM and strength. She has been having increased left upper medial arm pain mostly likely nerve pain since her surgery. The area is very sensitive to touch. Cut pt a piece of TG soft to wear to help reduce sensitivity and pt immediately reported relief. Also instructed pt in neural stretches and desensitization techniques. This evaluation was of low complexitiy due to lack of comorbidities and personal factors though her condition is evolving since she will begin radiation next Monday.    Rehab Potential Good   Clinical Impairments Affecting Rehab Potential will begin radiation 10/22/16   PT Frequency 1x / week   PT Duration 4 weeks   PT Treatment/Interventions Passive range of motion;Electrical Stimulation;ADLs/Self Care Home Management;Therapeutic exercise;Patient/family education;Manual techniques   PT Next Visit Plan possible e stim to help reduce nerve pain in left upper arm, assess if nerve stretches and desensitization techniques helped reduce pain   PT Home Exercise Plan desensitization techniques, wear TG soft to help reduce pain, neural stretches   Consulted and Agree  with Plan of Care Patient;Family member/caregiver   Family Member Consulted Husband      Patient will benefit from skilled therapeutic intervention in order to improve the following deficits and impairments:  Decreased knowledge of precautions, Pain, Impaired UE functional use  Visit Diagnosis: Pain in left upper arm - Plan: PT plan of care  cert/re-cert  Abnormal posture - Plan: PT plan of care cert/re-cert     Problem List Patient Active Problem List   Diagnosis Date Noted  . Genetic testing 05/15/2016  . Family history of breast cancer   . Family history of brain cancer   . Breast cancer of upper-outer quadrant of left female breast ([place]) 03/20/2016  . Transaminitis 10/03/2015  . Symptomatic cholelithiasis 10/03/2015  . RUQ abdominal pain   . Depression 06/24/2013  . Restless leg syndrome 06/24/2013    Alexia Freestone 10/18/2016, 12:05 PM  [place], [place], [address] Phone: [phone]   Fax:  [phone]  Name: Alexis Fox MRN: [id number] Date of Birth: 03/06/1956  Allyson Sabal, PT 10/18/16 12:05 PM

## 2016-10-18 NOTE — Progress Notes (Signed)
Office Visit Note   Patient: Alexis Fox           Date of Birth: July 21, 1956           MRN: [id number] Visit Date: 10/18/2016              Requested by: [name], MD [address] [place],  [address] PCP: [name], MD   Assessment & Plan: Visit Diagnoses:  1. Chronic pain of left ankle     Plan:  #1: At this time our plan is to perform a MRI scan of the left ankle with contrast. #2: Follow back up after MRI scan  Follow-Up Instructions: No Follow-up on file.   Orders:  Orders Placed This Encounter  Procedures  . XR Ankle Complete Left   No orders of the defined types were placed in this encounter.     Procedures: No procedures performed   Clinical Data: No additional findings.   Subjective: No chief complaint on file.   Patient is very pleasant 60 year old white female who is seen today for evaluation of her left ankle. Saw her back on 07/09/2016 that time she had at least a 6 month history of insidious onset of pain after walking in the left medial malleolar area. She did not have any ecchymosis or redness or any specific injury at that time. She had tried comfortable shoes with inserts and continued to have pain localized at the medial malleolus. Sometimes she was dull. Her that with her ambulation. She has had a corticosteroid injection which she states was not beneficial. She continues to have pain and discomfort.      Review of Systems  Hematological:       History of breast cancer  All other systems reviewed and are negative.    Objective: Vital Signs: BP 112/60 (BP Location: Right Arm)   Pulse 77   Resp 14   Ht 5\' 4"  (1.626 m)   Wt 200 lb (90.7 kg)   BMI 34.33 kg/m   Physical Exam  Constitutional: She is oriented to person, place, and time. She appears well-developed and well-nourished. She appears distressed.  HENT:  Head: Normocephalic and atraumatic.  Eyes: EOM are normal. Pupils are equal, round,  and reactive to light.  Neck:  No carotid bruits  Pulmonary/Chest: Effort normal.  Neurological: She is alert and oriented to person, place, and time.  Skin: Skin is warm and dry.  Psychiatric: She has a normal mood and affect. Her behavior is normal. Judgment and thought content normal.    Left Ankle Exam   Tenderness  The patient is experiencing tenderness in the deltoid.   Range of Motion  Dorsiflexion: normal  Plantar flexion: normal  Inversion: normal  Eversion: normal   Tests  Anterior drawer: negative Varus tilt: negative  Other  Sensation: normal Pulse: present  Comments:  Tenderness over the deltoid ligament especially anteriorly. Also tender over the distal tibia at the medial malleolus. Nothing proximal wall. No warmth or erythema.      Specialty Comments:  No specialty comments available.  Imaging: No results found.   PMFS History: Patient Active Problem List   Diagnosis Date Noted  . Genetic testing 05/15/2016  . Family history of breast cancer   . Family history of brain cancer   . Breast cancer of upper-outer quadrant of left female breast ([place]) 03/20/2016  . Transaminitis 10/03/2015  . Symptomatic cholelithiasis 10/03/2015  . RUQ abdominal pain   . Depression  06/24/2013  . Restless leg syndrome 06/24/2013   Past Medical History:  Diagnosis Date  . Anxiety   . Arthritis    knees  . Breast cancer ([place])   . Breast cancer of upper-outer quadrant of left female breast ([place]) 03/20/2016  . Cholecystitis   . Complication of anesthesia    BP "crashes" post op  . Depression   . Family history of brain cancer   . Family history of breast cancer   . Hot flashes   . Restless leg syndrome     Family History  Problem Relation Age of Onset  . Heart disease Mother   . Pulmonary fibrosis Mother   . Hypertension Mother   . Cancer Father 68    astocytoma  . Hypertension Brother   . Lymphoma Maternal Aunt   . Anesthesia problems Neg Hx       Past Surgical History:  Procedure Laterality Date  . BUNIONECTOMY    . CHOLECYSTECTOMY N/A 10/04/2015   Procedure: LAPAROSCOPIC CHOLECYSTECTOMY WITH INTRAOPERATIVE CHOLANGIOGRAM;  Surgeon: Greer Pickerel, MD;  Location: [place];  Service: General;  Laterality: N/A;  . ELBOW SURGERY     right for epicondylitis 2010   . FOOT SURGERY     1983 -tarsal tunnel release  . LUMBAR DISC SURGERY  04/04/2012  . PORTACATH PLACEMENT Right 04/02/2016   Procedure: INSERTION PORT-A-CATH WITH [place];  Surgeon: Rolm Bookbinder, MD;  Location: [place];  Service: General;  Laterality: Right;  . RADIOACTIVE SEED GUIDED PARTIAL MASTECTOMY/AXILLARY SENTINEL NODE BIOPSY/AXILLARY NODE DISSECTION Left 09/12/2016   Procedure: LEFT BREAST SEED GUIDED LUMPECTOMY, LEFT AXILLARY SENTINEL NODE BIOPSY, LEFT SEED GUIDED AXILLARY NODE EXCISION( TARGETED AXILLARY DISSECTION), BLUE DYE INJECTION;  Surgeon: Rolm Bookbinder, MD;  Location: [place];  Service: General;  Laterality: Left;  . SPINAL FUSION  5/12   L5-S1  . TARSAL TUNNEL RELEASE     Social History   Occupational History  . Not on file.   Social History Main Topics  . Smoking status: Never Smoker  . Smokeless tobacco: Never Used  . Alcohol use No  . Drug use: No  . Sexual activity: Yes    Birth control/ protection: Post-menopausal

## 2016-10-19 ENCOUNTER — Telehealth: Payer: Self-pay | Admitting: *Deleted

## 2016-10-19 ENCOUNTER — Ambulatory Visit (HOSPITAL_COMMUNITY)
Admission: RE | Admit: 2016-10-19 | Discharge: 2016-10-19 | Disposition: A | Discharge status: Home / Self Care | Payer: 59 | Source: Ambulatory Visit | Attending: Nurse Practitioner | Admitting: Nurse Practitioner

## 2016-10-19 ENCOUNTER — Other Ambulatory Visit: Payer: Self-pay | Admitting: *Deleted

## 2016-10-19 DIAGNOSIS — C50412 Malignant neoplasm of upper-outer quadrant of left female breast: Secondary | ICD-10-CM | POA: Diagnosis not present

## 2016-10-19 NOTE — Progress Notes (Signed)
VASCULAR LAB PRELIMINARY  PRELIMINARY  PRELIMINARY  PRELIMINARY  Right upper extremity venous duplex completed.    Preliminary report:  Right :  No evidence of DVT or superficial thrombosis.    Jaxin Fulfer, RVS 10/19/2016, 2:45 PM

## 2016-10-19 NOTE — Telephone Encounter (Signed)
Call made to [facility] Ultrasound/CV dept to schedule pt's right upper extremity U/S.Marland Kitchen Able to get 2pm aapt today.  TCT patient. Spoke with her and informed her of 2pm appt for U/S @ [facility]. Pt voiced understanding and states she will be able to make that appt.  Results of U/S will determine if pt needs dye study of PAC or not. Pt understands this.

## 2016-10-22 ENCOUNTER — Ambulatory Visit
Admission: RE | Admit: 2016-10-22 | Discharge: 2016-10-22 | Disposition: A | Discharge status: Home / Self Care | Payer: 59 | Source: Ambulatory Visit | Attending: Radiation Oncology | Admitting: Radiation Oncology

## 2016-10-22 ENCOUNTER — Encounter: Payer: Self-pay | Admitting: Nurse Practitioner

## 2016-10-22 DIAGNOSIS — F419 Anxiety disorder, unspecified: Secondary | ICD-10-CM | POA: Diagnosis not present

## 2016-10-22 DIAGNOSIS — Z9221 Personal history of antineoplastic chemotherapy: Secondary | ICD-10-CM | POA: Diagnosis not present

## 2016-10-22 DIAGNOSIS — C773 Secondary and unspecified malignant neoplasm of axilla and upper limb lymph nodes: Secondary | ICD-10-CM | POA: Diagnosis not present

## 2016-10-22 DIAGNOSIS — Z79899 Other long term (current) drug therapy: Secondary | ICD-10-CM | POA: Diagnosis not present

## 2016-10-22 DIAGNOSIS — Z171 Estrogen receptor negative status [ER-]: Secondary | ICD-10-CM | POA: Diagnosis not present

## 2016-10-22 DIAGNOSIS — F329 Major depressive disorder, single episode, unspecified: Secondary | ICD-10-CM | POA: Diagnosis not present

## 2016-10-22 DIAGNOSIS — M179 Osteoarthritis of knee, unspecified: Secondary | ICD-10-CM | POA: Diagnosis not present

## 2016-10-22 DIAGNOSIS — T829XXA Unspecified complication of cardiac and vascular prosthetic device, implant and graft, initial encounter: Secondary | ICD-10-CM | POA: Insufficient documentation

## 2016-10-22 DIAGNOSIS — Z510 Encounter for antineoplastic radiation therapy: Secondary | ICD-10-CM | POA: Diagnosis not present

## 2016-10-22 DIAGNOSIS — C50412 Malignant neoplasm of upper-outer quadrant of left female breast: Secondary | ICD-10-CM | POA: Diagnosis not present

## 2016-10-22 NOTE — Assessment & Plan Note (Signed)
Patient was seen in radiation oncology; for planned.  Start her radiation treatments on Monday, 10/22/2016.  She presented to the [facility] infusion.  Desk but this afternoon stating that she just noticed that the right anterior neck has some bruising at her Port-A-Cath site.  She denies any chest pain, chest pressure, or shortness of breath.  Chills denies any known injury or trauma to the site.  Exam today reveals right upper chest Port-A-Cath intact with no evidence of trauma or infection.  The tube into the right anterior neck does have what appears to be a resolving/healing bruise.  There is questionable edema surrounding the site as well.  There are no obvious distended veins noted.  Patient will be scheduled for an ultrasound Doppler of right upper extremity tomorrow, 10/19/2016 for further evaluation.  If the Doppler ultrasound is negative-may want to consider a Port-A-Cath dye study, per interventional radiology.  Also, patient was advised to go directly to the emergency department for any worsening symptoms whatsoever.

## 2016-10-22 NOTE — Progress Notes (Signed)
Patient was seen in radiation oncology; for planned.  Start her radiation treatments on Monday, 10/22/2016.  She presented to the [facility] infusion.  Desk but this afternoon stating that she just noticed that the right anterior neck has some bruising at her Port-A-Cath site.  She denies any chest pain, chest pressure, or shortness of breath.  Chills denies any known injury or trauma to the site.  Exam today reveals right upper chest Port-A-Cath intact with no evidence of trauma or infection.  The tube into the right anterior neck does have what appears to be a resolving/healing bruise.  There is questionable edema surrounding the site as well.  There are no obvious distended veins noted.  Patient will be scheduled for an ultrasound Doppler of right upper extremity tomorrow, 10/19/2016 for further evaluation.  If the Doppler ultrasound is negative-may want to consider a Port-A-Cath dye study, per interventional radiology.  Also, patient was advised to go directly to the emergency department for any worsening symptoms whatsoever.

## 2016-10-23 ENCOUNTER — Ambulatory Visit: Payer: 59 | Admitting: Physical Therapy

## 2016-10-23 ENCOUNTER — Ambulatory Visit
Admission: RE | Admit: 2016-10-23 | Discharge: 2016-10-23 | Disposition: A | Discharge status: Home / Self Care | Payer: 59 | Source: Ambulatory Visit | Attending: Radiation Oncology | Admitting: Radiation Oncology

## 2016-10-23 ENCOUNTER — Encounter: Payer: Self-pay | Admitting: Physical Therapy

## 2016-10-23 DIAGNOSIS — Z171 Estrogen receptor negative status [ER-]: Secondary | ICD-10-CM | POA: Diagnosis not present

## 2016-10-23 DIAGNOSIS — M79622 Pain in left upper arm: Secondary | ICD-10-CM

## 2016-10-23 DIAGNOSIS — C50412 Malignant neoplasm of upper-outer quadrant of left female breast: Secondary | ICD-10-CM | POA: Diagnosis not present

## 2016-10-23 DIAGNOSIS — C50912 Malignant neoplasm of unspecified site of left female breast: Secondary | ICD-10-CM | POA: Diagnosis not present

## 2016-10-23 DIAGNOSIS — F419 Anxiety disorder, unspecified: Secondary | ICD-10-CM | POA: Diagnosis not present

## 2016-10-23 DIAGNOSIS — R293 Abnormal posture: Secondary | ICD-10-CM

## 2016-10-23 DIAGNOSIS — Z79899 Other long term (current) drug therapy: Secondary | ICD-10-CM | POA: Diagnosis not present

## 2016-10-23 DIAGNOSIS — M179 Osteoarthritis of knee, unspecified: Secondary | ICD-10-CM | POA: Diagnosis not present

## 2016-10-23 DIAGNOSIS — C773 Secondary and unspecified malignant neoplasm of axilla and upper limb lymph nodes: Secondary | ICD-10-CM | POA: Diagnosis not present

## 2016-10-23 DIAGNOSIS — F329 Major depressive disorder, single episode, unspecified: Secondary | ICD-10-CM | POA: Diagnosis not present

## 2016-10-23 DIAGNOSIS — Z9221 Personal history of antineoplastic chemotherapy: Secondary | ICD-10-CM | POA: Diagnosis not present

## 2016-10-23 DIAGNOSIS — Z510 Encounter for antineoplastic radiation therapy: Secondary | ICD-10-CM | POA: Diagnosis not present

## 2016-10-23 NOTE — Therapy (Addendum)
[place], [place], [address] Phone: [phone]   Fax:  [phone]  Physical Therapy Treatment  Patient Details  Name: JAQUASIA DOSCHER MRN: [id number] Date of Birth: 07/21/1956 Referring Provider: Donne Hazel  Encounter Date: 10/23/2016      PT End of Session - 10/23/16 1152    Visit Number 2   Number of Visits 5   Date for PT Re-Evaluation 11/15/16   PT Start Time 6734   PT Stop Time 1100   PT Time Calculation (min) 46 min   Activity Tolerance Patient tolerated treatment well   Behavior During Therapy [facility] for tasks assessed/performed      Past Medical History:  Diagnosis Date  . Anxiety   . Arthritis    knees  . Breast cancer ([place])   . Breast cancer of upper-outer quadrant of left female breast ([place]) 03/20/2016  . Cholecystitis   . Complication of anesthesia    BP "crashes" post op  . Depression   . Family history of brain cancer   . Family history of breast cancer   . Hot flashes   . Restless leg syndrome     Past Surgical History:  Procedure Laterality Date  . BUNIONECTOMY    . CHOLECYSTECTOMY N/A 10/04/2015   Procedure: LAPAROSCOPIC CHOLECYSTECTOMY WITH INTRAOPERATIVE CHOLANGIOGRAM;  Surgeon: Greer Pickerel, MD;  Location: [place];  Service: General;  Laterality: N/A;  . ELBOW SURGERY     right for epicondylitis 2010   . FOOT SURGERY     1983 -tarsal tunnel release  . LUMBAR DISC SURGERY  04/04/2012  . PORTACATH PLACEMENT Right 04/02/2016   Procedure: INSERTION PORT-A-CATH WITH [place];  Surgeon: Rolm Bookbinder, MD;  Location: [place];  Service: General;  Laterality: Right;  . RADIOACTIVE SEED GUIDED PARTIAL MASTECTOMY/AXILLARY SENTINEL NODE BIOPSY/AXILLARY NODE DISSECTION Left 09/12/2016   Procedure: LEFT BREAST SEED GUIDED LUMPECTOMY, LEFT AXILLARY SENTINEL NODE BIOPSY, LEFT SEED GUIDED AXILLARY NODE EXCISION( TARGETED AXILLARY DISSECTION), BLUE DYE INJECTION;  Surgeon:  Rolm Bookbinder, MD;  Location: [place];  Service: General;  Laterality: Left;  . SPINAL FUSION  5/12   L5-S1  . TARSAL TUNNEL RELEASE      There were no vitals filed for this visit.      Subjective Assessment - 10/23/16 1014    Subjective Pt began having bruising around right port and had a doppler that was negative for DVT. They are going to do a flow study on it on Friday. My left arm is okay. It is still the same. The arm is less irritated during the day with the TG soft on. I think the sensitivity is gradually decreasing.    Pertinent History pt completed chemotherapy Sept 14, 2017, underwent a lumpectomy on Sep 12, 2016 and will begin radiation on 10/22/16, pt has restless leg syndrome, lumbar fusion L5-S1, depression   Patient Stated Goals reduce lymphedema risk and learn post op shoulder ROM HEP   Currently in Pain? No/denies   Pain Score 0-No pain                         [facility] Adult PT Treatment/Exercise - 10/23/16 0001      Shoulder Exercises: Supine   Horizontal ABduction Strengthening;Both;10 reps;Theraband   Theraband Level (Shoulder Horizontal ABduction) Level 2 (Red)   External Rotation Strengthening;Both;10 reps;Theraband   Theraband Level (Shoulder External Rotation) Level 2 (Red)   Flexion Strengthening;Both;10 reps;Theraband  Theraband Level (Shoulder Flexion) Level 2 (Red)   Other Supine Exercises D2 bilaterally x 10 with red band     Manual Therapy   Manual Therapy Neural Stretch;Other (comment);Passive ROM   Passive ROM in direction of left shoulder flexion and abduction to help stretch nerves and decrease sensitivity   Other Manual Therapy desensitation technique: brushed patients posterior and inner upper left arm with rough washclosh for about 5 min to help decrease sensitivity   Neural Stretch median nerve stretch in supine in varying degrees of abduction, keeping elbow straight and flexing/extending wrist                    Long Term Clinic Goals - 10/18/16 1202      CC Long Term Goal  #1   Title Pt will report a 75% improvement in left upper arm pain to allow increased comfort.   Time 4   Period Weeks   Status New     CC Long Term Goal  #2   Title Pt will be independent in desensitization techniques and neural stretches to help decrease pain   Time 4   Period Weeks   Status New            Plan - 10/23/16 1153    Clinical Impression Statement Patient states the TG soft has helped with the nerve pain in her left upper arm. She is still having discomfort here when her arm rubs her shirt. Performed desensitization technique using a rough washclosh brushed over the skin for approx 5 min today with pt reporting decreased sensitivity after this. Performed PROM in direction of flexion and abduction for neural stretching while keeping elbow straight and flexing and extending wrist. Patinet felt the nerve stretch with this especially in her axilla. It eased off as each ROM position was held. Gave pt supine scapular stabilization exercises with red theraband to further stretch the nerves. Patient stated her nerves were more sensitive post treatment.    Clinical Impairments Affecting Rehab Potential will begin radiation 10/22/16   PT Frequency 1x / week   PT Duration 4 weeks   PT Treatment/Interventions Passive range of motion;Electrical Stimulation;ADLs/Self Care Home Management;Therapeutic exercise;Patient/family education;Manual techniques   PT Next Visit Plan possible e stim to help reduce nerve pain in left upper arm, assess if nerve stretches and desensitization techniques helped reduce pain   PT Home Exercise Plan desensitization techniques, wear TG soft to help reduce pain, neural stretches   Consulted and Agree with Plan of Care Patient      Patient will benefit from skilled therapeutic intervention in order to improve the following deficits and impairments:  Decreased  knowledge of precautions, Pain, Impaired UE functional use  Visit Diagnosis: Pain in left upper arm  Abnormal posture     Problem List Patient Active Problem List   Diagnosis Date Noted  . Central line complication 34/74/2595  . Genetic testing 05/15/2016  . Family history of breast cancer   . Family history of brain cancer   . Breast cancer of upper-outer quadrant of left female breast ([place]) 03/20/2016  . Transaminitis 10/03/2015  . Symptomatic cholelithiasis 10/03/2015  . RUQ abdominal pain   . Depression 06/24/2013  . Restless leg syndrome 06/24/2013    Alexia Freestone 10/23/2016, 11:56 AM  [place], [place], [address] Phone: [phone]   Fax:  [phone]  Name: CHEYANE AYON MRN: [id number] Date of Birth: Sep 19, 1956  Allyson Sabal, PT 10/23/16 11:56 AM   PHYSICAL THERAPY DISCHARGE SUMMARY  Visits from Start of Care: 2  Current functional level related to goals / functional outcomes: Pt called and requested to be discharged from skilled PT services because she is doing fine and does not feel she needs to return to therapy. She reports improvement with pain. Remaining deficits: See above   Education / Equipment: HEP Plan: Patient agrees to discharge.  Patient goals were partially met. Patient is being discharged due to the patient's request.  ?????     Allyson Sabal, PT 10/29/16 3:49 PM

## 2016-10-24 ENCOUNTER — Other Ambulatory Visit: Payer: Self-pay | Admitting: *Deleted

## 2016-10-24 ENCOUNTER — Ambulatory Visit
Admission: RE | Admit: 2016-10-24 | Discharge: 2016-10-24 | Disposition: A | Discharge status: Home / Self Care | Payer: 59 | Source: Ambulatory Visit | Attending: Radiation Oncology | Admitting: Radiation Oncology

## 2016-10-24 VITALS — BP 124/70 | HR 70 | Resp 16 | Wt 198.4 lb

## 2016-10-24 DIAGNOSIS — Z79899 Other long term (current) drug therapy: Secondary | ICD-10-CM | POA: Diagnosis not present

## 2016-10-24 DIAGNOSIS — C50412 Malignant neoplasm of upper-outer quadrant of left female breast: Secondary | ICD-10-CM

## 2016-10-24 DIAGNOSIS — M179 Osteoarthritis of knee, unspecified: Secondary | ICD-10-CM | POA: Diagnosis not present

## 2016-10-24 DIAGNOSIS — Z9221 Personal history of antineoplastic chemotherapy: Secondary | ICD-10-CM | POA: Diagnosis not present

## 2016-10-24 DIAGNOSIS — F419 Anxiety disorder, unspecified: Secondary | ICD-10-CM | POA: Diagnosis not present

## 2016-10-24 DIAGNOSIS — F329 Major depressive disorder, single episode, unspecified: Secondary | ICD-10-CM | POA: Diagnosis not present

## 2016-10-24 DIAGNOSIS — C773 Secondary and unspecified malignant neoplasm of axilla and upper limb lymph nodes: Secondary | ICD-10-CM | POA: Diagnosis not present

## 2016-10-24 DIAGNOSIS — Z171 Estrogen receptor negative status [ER-]: Secondary | ICD-10-CM | POA: Diagnosis not present

## 2016-10-24 DIAGNOSIS — Z510 Encounter for antineoplastic radiation therapy: Secondary | ICD-10-CM | POA: Diagnosis not present

## 2016-10-24 MED ORDER — ALRA NON-METALLIC DEODORANT (RAD-ONC)
1.0000 "application " | Freq: Once | TOPICAL | Status: AC
  Administered 2016-10-24: 1 via TOPICAL

## 2016-10-24 MED ORDER — RADIAPLEXRX EX GEL
Freq: Once | CUTANEOUS | Status: AC
  Administered 2016-10-24: 16:00:00 via TOPICAL

## 2016-10-24 NOTE — Patient Outreach (Signed)
[facility] [facility]) Care Management  10/24/2016  Alexis Fox Feb 28, 1956 [id number]  RN's initial outreach attempt unsuccessful.  RN able to leave a HIPAA approved voice message requesting a call back. Will inquired further on pt's ongoing medical issues and possible [facility] assistance if needed. Will scheduled for another follow up call later in this week.  Raina Mina, RN Care Management Coordinator [place] Office [phone]

## 2016-10-24 NOTE — Progress Notes (Signed)
  Radiation Oncology         [phone]   Name: Alexis Fox MRN: [id number]   Date: 10/24/2016  DOB: 1956-09-05     Weekly Radiation Therapy Management    ICD-9-CM ICD-10-CM   1. Malignant neoplasm of upper-outer quadrant of left female breast, unspecified estrogen receptor status (HCC) 174.4 C50.412     Current Dose: 5.4 Gy  Planned Dose:  50.4 Gy  Narrative The patient presents for routine under treatment assessment.  Weight and vitals stable. Nursing notes, no skin changes within treatment field. Reports mild fatigue. Patient without complaints.     The patient is without complaint. Set-up films were reviewed. The chart was checked.  Physical Findings  weight is 198 lb 6.4 oz (90 kg). Her blood pressure is 124/70 and her pulse is 70. Her respiration is 16 and oxygen saturation is 100%. . Weight essentially stable.  No significant changes.  Impression The patient is tolerating radiation.  Plan Continue treatment as planned.         Sheral Apley Tammi Klippel, M.D.  This document serves as a record of services personally performed by Alexis Pita, MD. It was created on his behalf by Arlyce Harman, a trained medical scribe. The creation of this record is based on the scribe's personal observations and the provider's statements to them. This document has been checked and approved by the attending provider.

## 2016-10-24 NOTE — Addendum Note (Signed)
Encounter addended by: Heywood Footman, RN on: 10/24/2016  3:40 PM<BR>    Actions taken: Chief Complaint modified, Home Medications modified, Diagnosis association updated, Order list changed, MAR administration accepted, Order Reconciliation Section accessed

## 2016-10-24 NOTE — Progress Notes (Signed)
Weight and vitals stable. No skin changes within treatment field noted. Reports mild fatigue. Patient without complaints. Completed post sim education with patient today.   BP 124/70 (BP Location: Right Arm, Patient Position: Sitting, Cuff Size: Large)   Pulse 70   Resp 16   Wt 198 lb 6.4 oz (90 kg)   SpO2 100%   BMI 34.06 kg/m  Wt Readings from Last 3 Encounters:  10/24/16 198 lb 6.4 oz (90 kg)  10/18/16 200 lb (90.7 kg)  10/01/16 202 lb (91.6 kg)

## 2016-10-26 ENCOUNTER — Ambulatory Visit (HOSPITAL_COMMUNITY)
Admission: RE | Admit: 2016-10-26 | Discharge: 2016-10-26 | Disposition: A | Discharge status: Home / Self Care | Payer: 59 | Source: Ambulatory Visit | Attending: Interventional Radiology | Admitting: Interventional Radiology

## 2016-10-26 ENCOUNTER — Ambulatory Visit: Payer: 59

## 2016-10-26 ENCOUNTER — Encounter (HOSPITAL_COMMUNITY): Payer: Self-pay | Admitting: Interventional Radiology

## 2016-10-26 DIAGNOSIS — Y713 Surgical instruments, materials and cardiovascular devices (including sutures) associated with adverse incidents: Secondary | ICD-10-CM | POA: Diagnosis not present

## 2016-10-26 DIAGNOSIS — T82594A Other mechanical complication of infusion catheter, initial encounter: Secondary | ICD-10-CM | POA: Diagnosis not present

## 2016-10-26 DIAGNOSIS — C50412 Malignant neoplasm of upper-outer quadrant of left female breast: Secondary | ICD-10-CM

## 2016-10-26 DIAGNOSIS — C50919 Malignant neoplasm of unspecified site of unspecified female breast: Secondary | ICD-10-CM | POA: Diagnosis not present

## 2016-10-26 DIAGNOSIS — Z452 Encounter for adjustment and management of vascular access device: Secondary | ICD-10-CM | POA: Diagnosis not present

## 2016-10-26 HISTORY — PX: IR GENERIC HISTORICAL: IMG1180011

## 2016-10-26 MED ORDER — HEPARIN SOD (PORK) LOCK FLUSH 100 UNIT/ML IV SOLN
INTRAVENOUS | Status: AC | PRN
  Administered 2016-10-26: 500 [IU]

## 2016-10-26 MED ORDER — IOPAMIDOL (ISOVUE-300) INJECTION 61%
INTRAVENOUS | Status: AC
  Filled 2016-10-26: qty 50

## 2016-10-26 MED ORDER — HEPARIN SOD (PORK) LOCK FLUSH 100 UNIT/ML IV SOLN
INTRAVENOUS | Status: AC
  Filled 2016-10-26: qty 5

## 2016-10-26 MED ORDER — IOPAMIDOL (ISOVUE-300) INJECTION 61%
INTRAVENOUS | Status: AC | PRN
  Administered 2016-10-26: 5 mL via INTRAVENOUS

## 2016-10-26 NOTE — Procedures (Signed)
Interventional Radiology Procedure Note  Procedure: Injection of malfunctioning Port.  Right IJ  Complications: None  Findings:  No evidence of fracture.  Fibrin Sheath present on the tip of catheter.   Recommendations:  - Appears to have retained the flush function.  - Would anticipate inability to aspirate - May discuss with Dr. Donne Hazel (physician who placed [facility]) regarding further management.  - Routine care   Signed,  Dulcy Fanny. Earleen Newport, DO

## 2016-10-29 ENCOUNTER — Other Ambulatory Visit: Payer: Self-pay | Admitting: *Deleted

## 2016-10-29 ENCOUNTER — Ambulatory Visit
Admission: RE | Admit: 2016-10-29 | Discharge: 2016-10-29 | Disposition: A | Discharge status: Home / Self Care | Payer: 59 | Source: Ambulatory Visit | Attending: Radiation Oncology | Admitting: Radiation Oncology

## 2016-10-29 DIAGNOSIS — Z79899 Other long term (current) drug therapy: Secondary | ICD-10-CM | POA: Diagnosis not present

## 2016-10-29 DIAGNOSIS — C773 Secondary and unspecified malignant neoplasm of axilla and upper limb lymph nodes: Secondary | ICD-10-CM | POA: Diagnosis not present

## 2016-10-29 DIAGNOSIS — Z9221 Personal history of antineoplastic chemotherapy: Secondary | ICD-10-CM | POA: Diagnosis not present

## 2016-10-29 DIAGNOSIS — F419 Anxiety disorder, unspecified: Secondary | ICD-10-CM | POA: Diagnosis not present

## 2016-10-29 DIAGNOSIS — M179 Osteoarthritis of knee, unspecified: Secondary | ICD-10-CM | POA: Diagnosis not present

## 2016-10-29 DIAGNOSIS — C50412 Malignant neoplasm of upper-outer quadrant of left female breast: Secondary | ICD-10-CM | POA: Diagnosis not present

## 2016-10-29 DIAGNOSIS — F329 Major depressive disorder, single episode, unspecified: Secondary | ICD-10-CM | POA: Diagnosis not present

## 2016-10-29 DIAGNOSIS — Z510 Encounter for antineoplastic radiation therapy: Secondary | ICD-10-CM | POA: Diagnosis not present

## 2016-10-29 DIAGNOSIS — Z171 Estrogen receptor negative status [ER-]: Secondary | ICD-10-CM | POA: Diagnosis not present

## 2016-10-29 NOTE — Patient Outreach (Signed)
[facility] [facility]) Care Management  10/29/2016  Alexis Fox 04-11-1956 [id number]   RN attempted another outreach call today and was able to speak with the pt. RN introduced the [facility] program and services via [facility]. Completed the screening and verified pt's has the needed resources and support system during her ongoing treatments. Offered additional resources if needed and encouraged [facility] for her ongoing medications. Verified pt has sufficient funding for her ongoing medications and sufficient to all her appointments. RN also offered ongoing [facility] services for telephonic disease management however pt opt to decline at this time. Pt has a good support system and is managing her ongoing medical issues with the available resources. Will close case from further telephonic screening at this time.  Raina Mina, RN Care Management Coordinator [place] Office [phone]

## 2016-10-29 NOTE — Addendum Note (Signed)
Encounter addended by: Heywood Footman, RN on: 10/29/2016  9:35 AM<BR>    Actions taken: Patient Education assessment filed

## 2016-10-30 ENCOUNTER — Ambulatory Visit
Admission: RE | Admit: 2016-10-30 | Discharge: 2016-10-30 | Disposition: A | Discharge status: Home / Self Care | Payer: 59 | Source: Ambulatory Visit | Attending: Radiation Oncology | Admitting: Radiation Oncology

## 2016-10-30 ENCOUNTER — Telehealth: Payer: Self-pay | Admitting: *Deleted

## 2016-10-30 DIAGNOSIS — Z171 Estrogen receptor negative status [ER-]: Secondary | ICD-10-CM | POA: Diagnosis not present

## 2016-10-30 DIAGNOSIS — Z510 Encounter for antineoplastic radiation therapy: Secondary | ICD-10-CM | POA: Diagnosis not present

## 2016-10-30 DIAGNOSIS — M179 Osteoarthritis of knee, unspecified: Secondary | ICD-10-CM | POA: Diagnosis not present

## 2016-10-30 DIAGNOSIS — Z9221 Personal history of antineoplastic chemotherapy: Secondary | ICD-10-CM | POA: Diagnosis not present

## 2016-10-30 DIAGNOSIS — Z79899 Other long term (current) drug therapy: Secondary | ICD-10-CM | POA: Diagnosis not present

## 2016-10-30 DIAGNOSIS — F419 Anxiety disorder, unspecified: Secondary | ICD-10-CM | POA: Diagnosis not present

## 2016-10-30 DIAGNOSIS — C50412 Malignant neoplasm of upper-outer quadrant of left female breast: Secondary | ICD-10-CM | POA: Diagnosis not present

## 2016-10-30 DIAGNOSIS — C773 Secondary and unspecified malignant neoplasm of axilla and upper limb lymph nodes: Secondary | ICD-10-CM | POA: Diagnosis not present

## 2016-10-30 DIAGNOSIS — F329 Major depressive disorder, single episode, unspecified: Secondary | ICD-10-CM | POA: Diagnosis not present

## 2016-10-30 IMAGING — MG MM SCREEN MAMMOGRAM BILATERAL
4 series · 4 of 4 positions shown · non-contrast
Comparison: Previous exam(s).

CLINICAL DATA: Screening.

EXAM:
DIGITAL SCREENING BILATERAL MAMMOGRAM WITH CAD

[R CC]
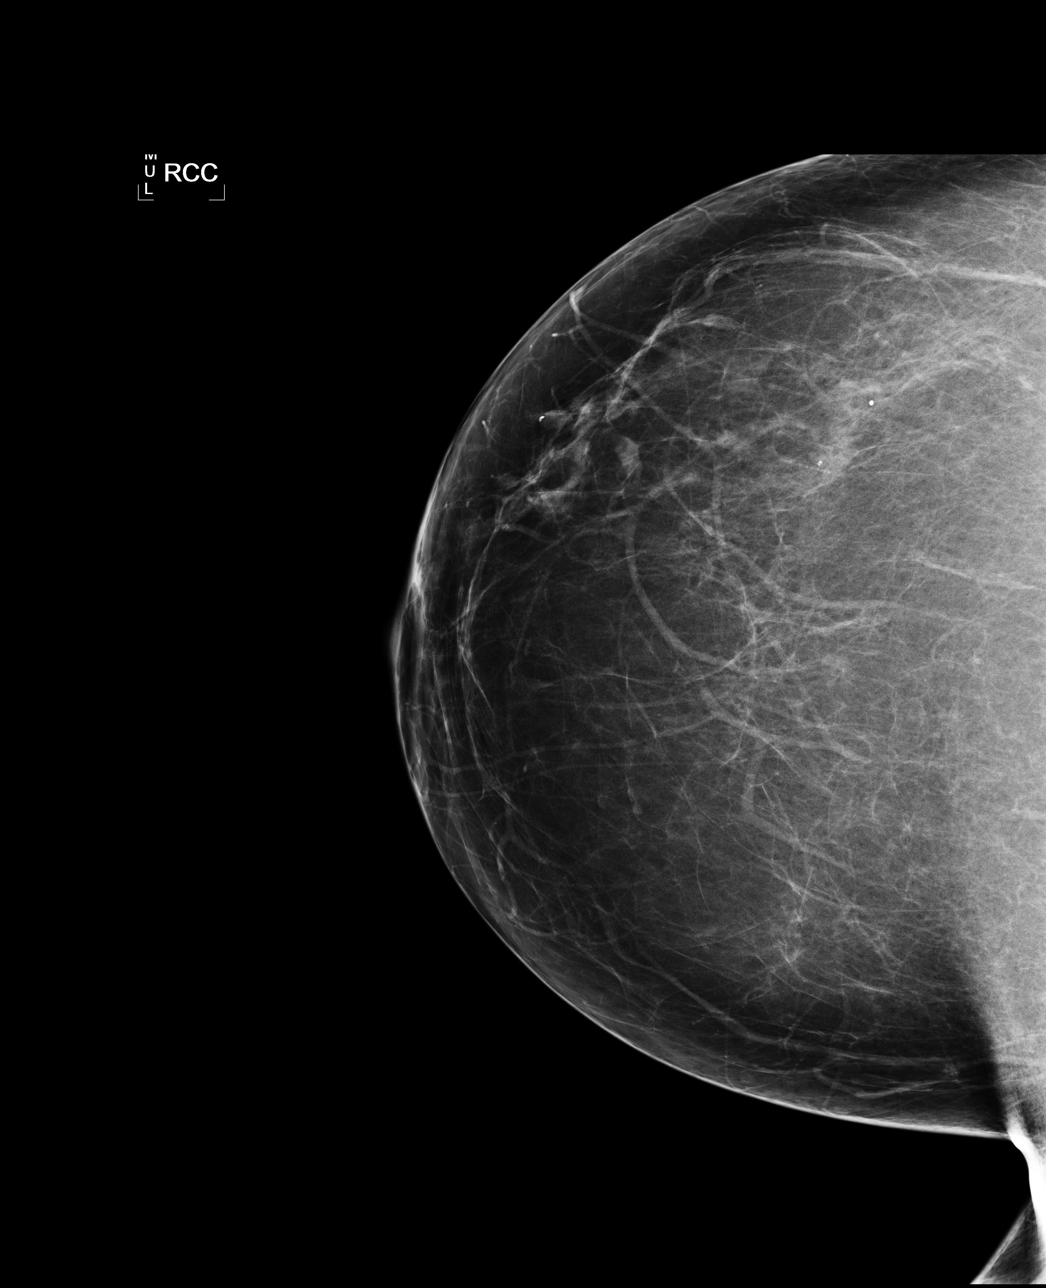

[L CC]
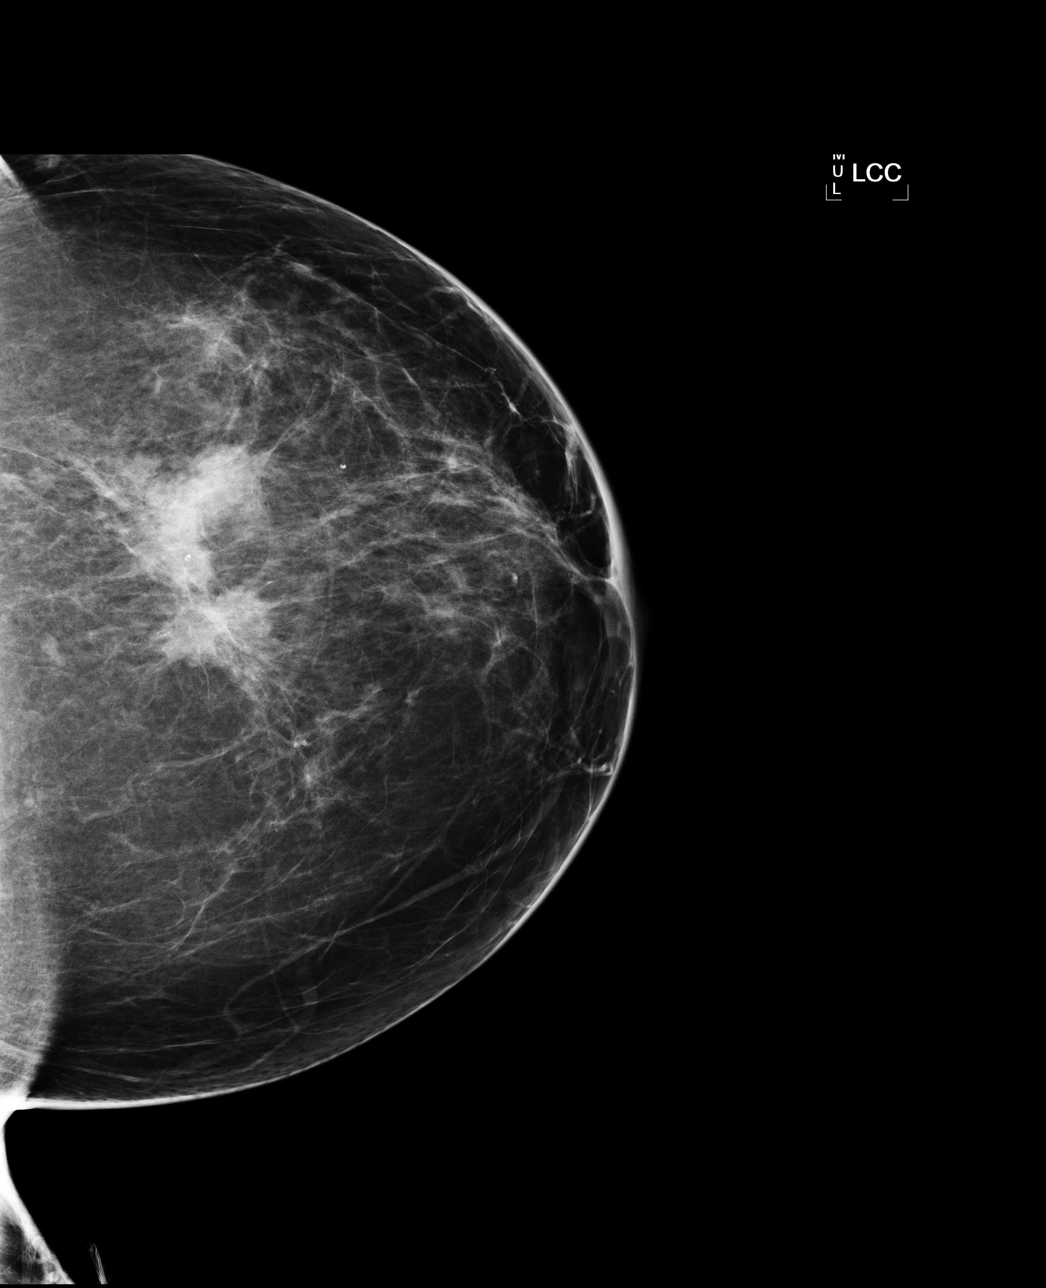

[L MLO]
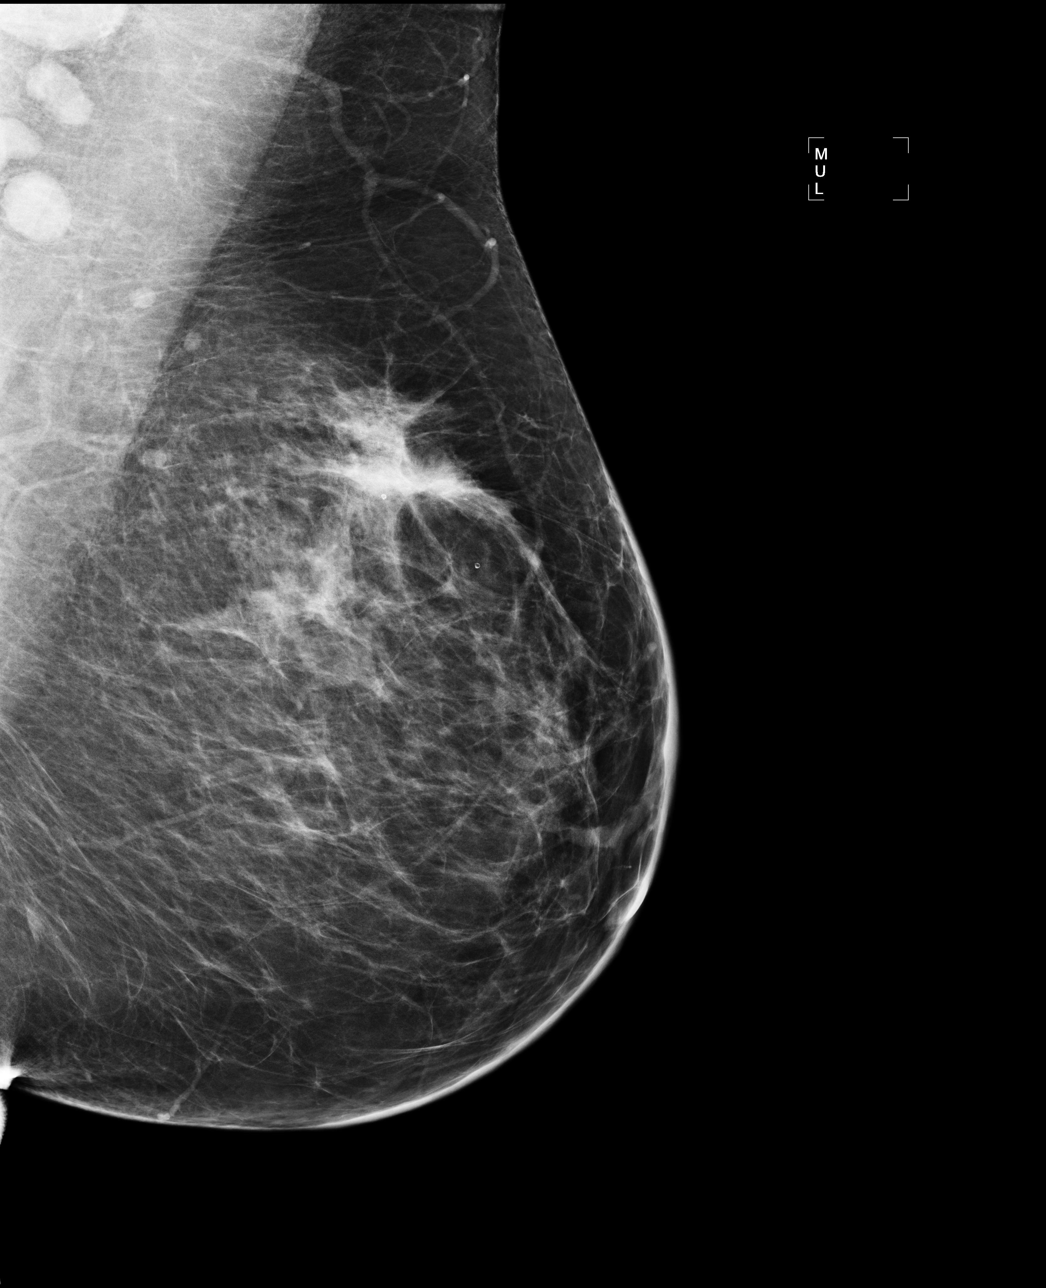

[R MLO]
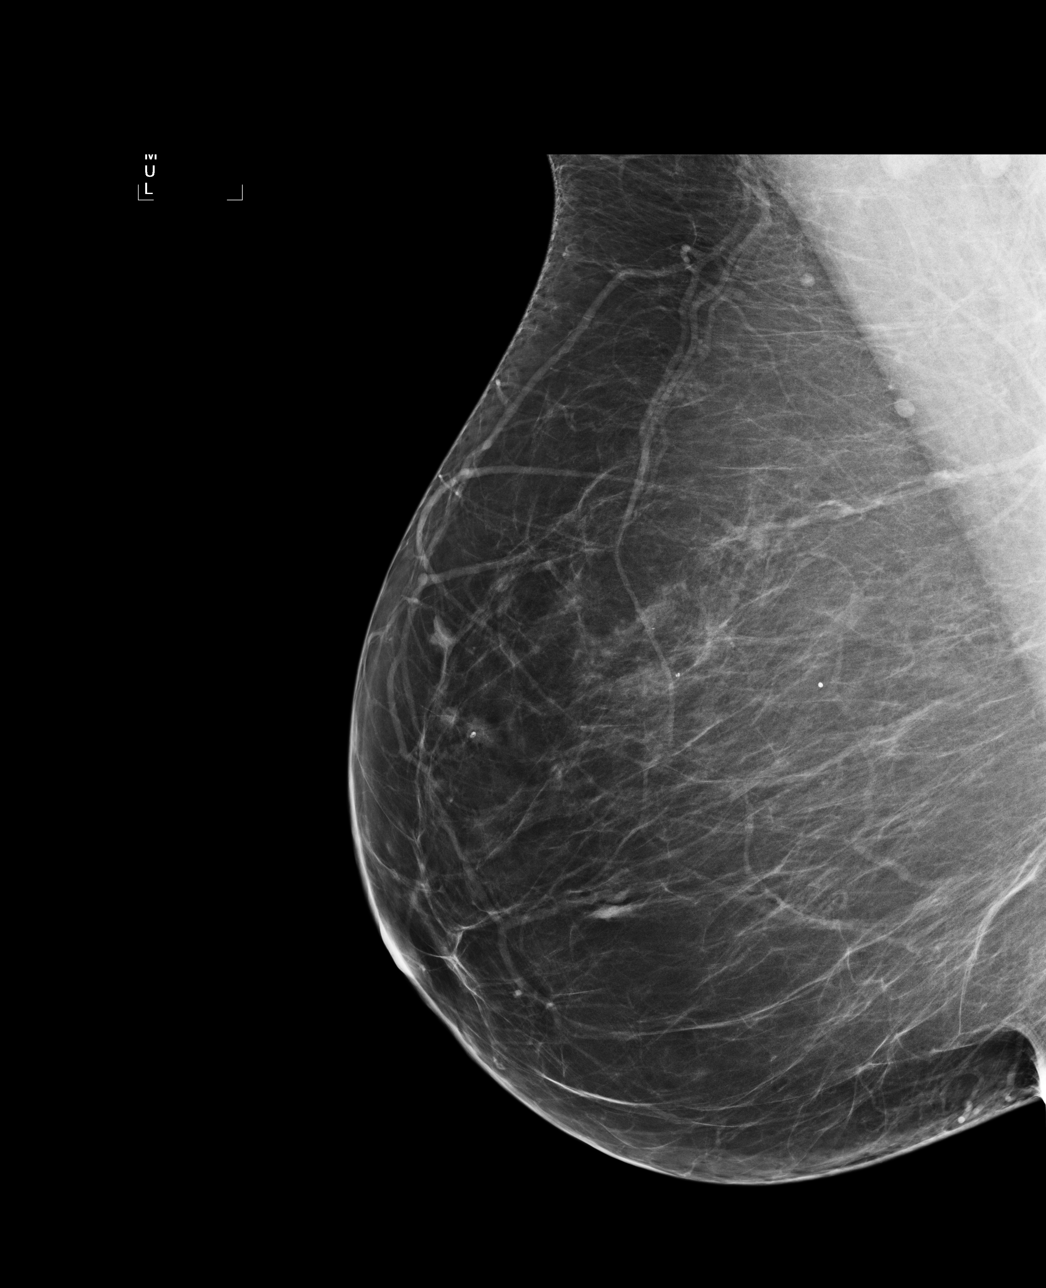

[4 of 4 positions shown; findings below may reference images not displayed]

ACR Breast Density Category b: There are scattered areas of
fibroglandular density.
FINDINGS: In the left breast, a possible mass warrants further evaluation. In
the right breast, no findings suspicious for malignancy.

Images were processed with CAD.
IMPRESSION: Further evaluation is suggested for possible mass in the left
breast.

RECOMMENDATION:
Diagnostic mammogram and possibly ultrasound of the left breast.
(Code:5M-3-PPT)

The patient will be contacted regarding the findings, and additional
imaging will be scheduled.

BI-RADS CATEGORY  0: Incomplete. Need additional imaging evaluation
and/or prior mammograms for comparison.

## 2016-10-30 NOTE — Telephone Encounter (Signed)
  Oncology Nurse Navigator Documentation  Navigator Location: [facility] (10/30/16 1200)   )Navigator Encounter Type: Telephone (10/30/16 1200) Telephone: [facility] Call (10/30/16 1200)                   Patient Visit Type: [id number] (10/30/16 1200) Treatment Phase: First Radiation Tx (10/30/16 1200)                            Time Spent with Patient: 15 (10/30/16 1200)

## 2016-10-31 ENCOUNTER — Ambulatory Visit
Admission: RE | Admit: 2016-10-31 | Discharge: 2016-10-31 | Disposition: A | Discharge status: Home / Self Care | Payer: 59 | Source: Ambulatory Visit | Attending: Radiation Oncology | Admitting: Radiation Oncology

## 2016-10-31 ENCOUNTER — Other Ambulatory Visit: Payer: Self-pay | Admitting: General Surgery

## 2016-10-31 DIAGNOSIS — F419 Anxiety disorder, unspecified: Secondary | ICD-10-CM | POA: Diagnosis not present

## 2016-10-31 DIAGNOSIS — F329 Major depressive disorder, single episode, unspecified: Secondary | ICD-10-CM | POA: Diagnosis not present

## 2016-10-31 DIAGNOSIS — C50412 Malignant neoplasm of upper-outer quadrant of left female breast: Secondary | ICD-10-CM | POA: Diagnosis not present

## 2016-10-31 DIAGNOSIS — Z171 Estrogen receptor negative status [ER-]: Secondary | ICD-10-CM | POA: Diagnosis not present

## 2016-10-31 DIAGNOSIS — Z9221 Personal history of antineoplastic chemotherapy: Secondary | ICD-10-CM | POA: Diagnosis not present

## 2016-10-31 DIAGNOSIS — C773 Secondary and unspecified malignant neoplasm of axilla and upper limb lymph nodes: Secondary | ICD-10-CM | POA: Diagnosis not present

## 2016-10-31 DIAGNOSIS — Z79899 Other long term (current) drug therapy: Secondary | ICD-10-CM | POA: Diagnosis not present

## 2016-10-31 DIAGNOSIS — Z510 Encounter for antineoplastic radiation therapy: Secondary | ICD-10-CM | POA: Diagnosis not present

## 2016-10-31 DIAGNOSIS — M179 Osteoarthritis of knee, unspecified: Secondary | ICD-10-CM | POA: Diagnosis not present

## 2016-11-01 ENCOUNTER — Ambulatory Visit
Admission: RE | Admit: 2016-11-01 | Discharge: 2016-11-01 | Disposition: A | Discharge status: Home / Self Care | Payer: 59 | Source: Ambulatory Visit | Attending: Radiation Oncology | Admitting: Radiation Oncology

## 2016-11-01 ENCOUNTER — Encounter: Payer: 59 | Admitting: Physical Therapy

## 2016-11-01 DIAGNOSIS — M179 Osteoarthritis of knee, unspecified: Secondary | ICD-10-CM | POA: Diagnosis not present

## 2016-11-01 DIAGNOSIS — C50412 Malignant neoplasm of upper-outer quadrant of left female breast: Secondary | ICD-10-CM | POA: Diagnosis not present

## 2016-11-01 DIAGNOSIS — C773 Secondary and unspecified malignant neoplasm of axilla and upper limb lymph nodes: Secondary | ICD-10-CM | POA: Diagnosis not present

## 2016-11-01 DIAGNOSIS — Z9221 Personal history of antineoplastic chemotherapy: Secondary | ICD-10-CM | POA: Diagnosis not present

## 2016-11-01 DIAGNOSIS — Z79899 Other long term (current) drug therapy: Secondary | ICD-10-CM | POA: Diagnosis not present

## 2016-11-01 DIAGNOSIS — Z510 Encounter for antineoplastic radiation therapy: Secondary | ICD-10-CM | POA: Diagnosis not present

## 2016-11-01 DIAGNOSIS — F329 Major depressive disorder, single episode, unspecified: Secondary | ICD-10-CM | POA: Diagnosis not present

## 2016-11-01 DIAGNOSIS — F419 Anxiety disorder, unspecified: Secondary | ICD-10-CM | POA: Diagnosis not present

## 2016-11-01 DIAGNOSIS — Z171 Estrogen receptor negative status [ER-]: Secondary | ICD-10-CM | POA: Diagnosis not present

## 2016-11-02 ENCOUNTER — Encounter: Payer: Self-pay | Admitting: Radiation Oncology

## 2016-11-02 ENCOUNTER — Ambulatory Visit
Admission: RE | Admit: 2016-11-02 | Discharge: 2016-11-02 | Disposition: A | Discharge status: Home / Self Care | Payer: 59 | Source: Ambulatory Visit | Attending: Radiation Oncology | Admitting: Radiation Oncology

## 2016-11-02 VITALS — BP 109/77 | HR 82 | Temp 98.6°F | Resp 18 | Wt 200.0 lb

## 2016-11-02 DIAGNOSIS — Z510 Encounter for antineoplastic radiation therapy: Secondary | ICD-10-CM | POA: Diagnosis not present

## 2016-11-02 DIAGNOSIS — Z79899 Other long term (current) drug therapy: Secondary | ICD-10-CM | POA: Diagnosis not present

## 2016-11-02 DIAGNOSIS — F419 Anxiety disorder, unspecified: Secondary | ICD-10-CM | POA: Diagnosis not present

## 2016-11-02 DIAGNOSIS — Z9221 Personal history of antineoplastic chemotherapy: Secondary | ICD-10-CM | POA: Diagnosis not present

## 2016-11-02 DIAGNOSIS — M179 Osteoarthritis of knee, unspecified: Secondary | ICD-10-CM | POA: Diagnosis not present

## 2016-11-02 DIAGNOSIS — F329 Major depressive disorder, single episode, unspecified: Secondary | ICD-10-CM | POA: Diagnosis not present

## 2016-11-02 DIAGNOSIS — C773 Secondary and unspecified malignant neoplasm of axilla and upper limb lymph nodes: Secondary | ICD-10-CM | POA: Diagnosis not present

## 2016-11-02 DIAGNOSIS — C50412 Malignant neoplasm of upper-outer quadrant of left female breast: Secondary | ICD-10-CM | POA: Diagnosis not present

## 2016-11-02 DIAGNOSIS — Z171 Estrogen receptor negative status [ER-]: Secondary | ICD-10-CM | POA: Diagnosis not present

## 2016-11-02 NOTE — Progress Notes (Signed)
  Radiation Oncology         [phone]   Name: Alexis Fox MRN: [id number]   Date: 11/02/2016  DOB: 1956-10-26     Weekly Radiation Therapy Management    ICD-9-CM ICD-10-CM   1. Breast cancer of upper-outer quadrant of left female breast ([place]) 174.4 C50.412     Current Dose: 14.4 Gy  Planned Dose:  50.4 Gy  Narrative The patient presents for routine under treatment assessment.  Weekly rad txs left breast  Subclavicular  region, 8/28 completed. She has occasional discomfort the left breast when getting off the radiation table and it resolves quickly. Her main c/o uncontrollable diarrhea. She takes imodium, the diarrhea stops, then occurs again. She reports it happened today as well. She states she has diarrhea ~6 times a day.  Set-up films were reviewed. The chart was checked.  Physical Findings  weight is 200 lb (90.7 kg). Her oral temperature is 98.6 F (37 C). Her blood pressure is 109/77 and her pulse is 82. Her respiration is 18. . Weight essentially stable.  No significant changes.  Very faint if any pink tinge on breast, skin is intact.   Impression The patient is tolerating radiation. The patient's treatment area is not her her GI tract.  Plan Continue treatment as planned. I advised the patient to contact her PCP in regards to her GI issues.         Sheral Apley Tammi Klippel, M.D.  This document serves as a record of services personally performed by Tyler Pita, MD. It was created on his behalf by Darcus Austin, a trained medical scribe. The creation of this record is based on the scribe's personal observations and the provider's statements to them. This document has been checked and approved by the attending provider.

## 2016-11-02 NOTE — Progress Notes (Signed)
Weekly rad txs left breast  Subclavicular  region,8/28 completed, no skin changes, very faint if any pink tinge on breast, skin is intact, occasional discomfort  In breast when getting off radiation table,resolves quickly, her main c/o uncontrollable GI upset, takes imodium and diarrhea stops, then occurs again, happened today as well uncontrollable diarrhea 11:23 AM BP 109/77 (BP Location: Right Arm, Patient Position: Sitting, Cuff Size: Large)   Pulse 82   Temp 98.6 F (37 C) (Oral)   Resp 18   Wt 200 lb (90.7 kg)   BMI 34.33 kg/m   Wt Readings from Last 3 Encounters:  11/02/16 200 lb (90.7 kg)  10/24/16 198 lb 6.4 oz (90 kg)  10/18/16 200 lb (90.7 kg)

## 2016-11-05 ENCOUNTER — Ambulatory Visit
Admission: RE | Admit: 2016-11-05 | Discharge: 2016-11-05 | Disposition: A | Discharge status: Home / Self Care | Payer: 59 | Source: Ambulatory Visit | Attending: Radiation Oncology | Admitting: Radiation Oncology

## 2016-11-05 DIAGNOSIS — M179 Osteoarthritis of knee, unspecified: Secondary | ICD-10-CM | POA: Diagnosis not present

## 2016-11-05 DIAGNOSIS — Z510 Encounter for antineoplastic radiation therapy: Secondary | ICD-10-CM | POA: Diagnosis not present

## 2016-11-05 DIAGNOSIS — F329 Major depressive disorder, single episode, unspecified: Secondary | ICD-10-CM | POA: Diagnosis not present

## 2016-11-05 DIAGNOSIS — C50412 Malignant neoplasm of upper-outer quadrant of left female breast: Secondary | ICD-10-CM | POA: Diagnosis not present

## 2016-11-05 DIAGNOSIS — F419 Anxiety disorder, unspecified: Secondary | ICD-10-CM | POA: Diagnosis not present

## 2016-11-05 DIAGNOSIS — Z79899 Other long term (current) drug therapy: Secondary | ICD-10-CM | POA: Diagnosis not present

## 2016-11-05 DIAGNOSIS — Z9221 Personal history of antineoplastic chemotherapy: Secondary | ICD-10-CM | POA: Diagnosis not present

## 2016-11-05 DIAGNOSIS — Z171 Estrogen receptor negative status [ER-]: Secondary | ICD-10-CM | POA: Diagnosis not present

## 2016-11-05 DIAGNOSIS — C773 Secondary and unspecified malignant neoplasm of axilla and upper limb lymph nodes: Secondary | ICD-10-CM | POA: Diagnosis not present

## 2016-11-06 ENCOUNTER — Ambulatory Visit
Admission: RE | Admit: 2016-11-06 | Discharge: 2016-11-06 | Disposition: A | Discharge status: Home / Self Care | Payer: 59 | Source: Ambulatory Visit | Attending: Radiation Oncology | Admitting: Radiation Oncology

## 2016-11-06 DIAGNOSIS — M179 Osteoarthritis of knee, unspecified: Secondary | ICD-10-CM | POA: Diagnosis not present

## 2016-11-06 DIAGNOSIS — Z9221 Personal history of antineoplastic chemotherapy: Secondary | ICD-10-CM | POA: Diagnosis not present

## 2016-11-06 DIAGNOSIS — Z79899 Other long term (current) drug therapy: Secondary | ICD-10-CM | POA: Diagnosis not present

## 2016-11-06 DIAGNOSIS — F419 Anxiety disorder, unspecified: Secondary | ICD-10-CM | POA: Diagnosis not present

## 2016-11-06 DIAGNOSIS — C50412 Malignant neoplasm of upper-outer quadrant of left female breast: Secondary | ICD-10-CM | POA: Diagnosis not present

## 2016-11-06 DIAGNOSIS — C773 Secondary and unspecified malignant neoplasm of axilla and upper limb lymph nodes: Secondary | ICD-10-CM | POA: Diagnosis not present

## 2016-11-06 DIAGNOSIS — Z171 Estrogen receptor negative status [ER-]: Secondary | ICD-10-CM | POA: Diagnosis not present

## 2016-11-06 DIAGNOSIS — Z510 Encounter for antineoplastic radiation therapy: Secondary | ICD-10-CM | POA: Diagnosis not present

## 2016-11-06 DIAGNOSIS — F329 Major depressive disorder, single episode, unspecified: Secondary | ICD-10-CM | POA: Diagnosis not present

## 2016-11-07 ENCOUNTER — Ambulatory Visit
Admission: RE | Admit: 2016-11-07 | Discharge: 2016-11-07 | Disposition: A | Discharge status: Home / Self Care | Payer: 59 | Source: Ambulatory Visit | Attending: Radiation Oncology | Admitting: Radiation Oncology

## 2016-11-07 DIAGNOSIS — Z510 Encounter for antineoplastic radiation therapy: Secondary | ICD-10-CM | POA: Diagnosis not present

## 2016-11-07 DIAGNOSIS — Z9221 Personal history of antineoplastic chemotherapy: Secondary | ICD-10-CM | POA: Diagnosis not present

## 2016-11-07 DIAGNOSIS — C773 Secondary and unspecified malignant neoplasm of axilla and upper limb lymph nodes: Secondary | ICD-10-CM | POA: Diagnosis not present

## 2016-11-07 DIAGNOSIS — F419 Anxiety disorder, unspecified: Secondary | ICD-10-CM | POA: Diagnosis not present

## 2016-11-07 DIAGNOSIS — Z79899 Other long term (current) drug therapy: Secondary | ICD-10-CM | POA: Diagnosis not present

## 2016-11-07 DIAGNOSIS — F329 Major depressive disorder, single episode, unspecified: Secondary | ICD-10-CM | POA: Diagnosis not present

## 2016-11-07 DIAGNOSIS — C50412 Malignant neoplasm of upper-outer quadrant of left female breast: Secondary | ICD-10-CM | POA: Diagnosis not present

## 2016-11-07 DIAGNOSIS — Z171 Estrogen receptor negative status [ER-]: Secondary | ICD-10-CM | POA: Diagnosis not present

## 2016-11-07 DIAGNOSIS — M179 Osteoarthritis of knee, unspecified: Secondary | ICD-10-CM | POA: Diagnosis not present

## 2016-11-07 MED FILL — PREVIDENT 5000 1.1% DRY MOU: 1.1 | 30 days supply | Qty: 100 | Fill #0

## 2016-11-08 ENCOUNTER — Ambulatory Visit
Admission: RE | Admit: 2016-11-08 | Discharge: 2016-11-08 | Disposition: A | Discharge status: Home / Self Care | Payer: 59 | Source: Ambulatory Visit | Attending: Radiation Oncology | Admitting: Radiation Oncology

## 2016-11-08 ENCOUNTER — Encounter: Payer: 59 | Admitting: Physical Therapy

## 2016-11-08 ENCOUNTER — Ambulatory Visit (HOSPITAL_COMMUNITY)
Admission: RE | Admit: 2016-11-08 | Discharge: 2016-11-08 | Disposition: A | Discharge status: Home / Self Care | Payer: 59 | Source: Ambulatory Visit | Attending: Orthopedic Surgery | Admitting: Orthopedic Surgery

## 2016-11-08 ENCOUNTER — Encounter: Payer: Self-pay | Admitting: Radiation Oncology

## 2016-11-08 VITALS — BP 118/65 | HR 79 | Temp 98.9°F

## 2016-11-08 DIAGNOSIS — C773 Secondary and unspecified malignant neoplasm of axilla and upper limb lymph nodes: Secondary | ICD-10-CM | POA: Diagnosis not present

## 2016-11-08 DIAGNOSIS — G8929 Other chronic pain: Secondary | ICD-10-CM | POA: Insufficient documentation

## 2016-11-08 DIAGNOSIS — F419 Anxiety disorder, unspecified: Secondary | ICD-10-CM | POA: Diagnosis not present

## 2016-11-08 DIAGNOSIS — F329 Major depressive disorder, single episode, unspecified: Secondary | ICD-10-CM | POA: Diagnosis not present

## 2016-11-08 DIAGNOSIS — C50412 Malignant neoplasm of upper-outer quadrant of left female breast: Secondary | ICD-10-CM | POA: Diagnosis not present

## 2016-11-08 DIAGNOSIS — M25572 Pain in left ankle and joints of left foot: Secondary | ICD-10-CM | POA: Diagnosis not present

## 2016-11-08 DIAGNOSIS — M179 Osteoarthritis of knee, unspecified: Secondary | ICD-10-CM | POA: Diagnosis not present

## 2016-11-08 DIAGNOSIS — Z79899 Other long term (current) drug therapy: Secondary | ICD-10-CM | POA: Diagnosis not present

## 2016-11-08 DIAGNOSIS — Z9221 Personal history of antineoplastic chemotherapy: Secondary | ICD-10-CM | POA: Diagnosis not present

## 2016-11-08 DIAGNOSIS — Z510 Encounter for antineoplastic radiation therapy: Secondary | ICD-10-CM | POA: Diagnosis not present

## 2016-11-08 DIAGNOSIS — Z171 Estrogen receptor negative status [ER-]: Secondary | ICD-10-CM | POA: Diagnosis not present

## 2016-11-08 LAB — POCT I-STAT CREATININE: Creatinine, Ser: 0.8 mg/dL (ref 0.44–1.00)

## 2016-11-08 MED ORDER — GADOBENATE DIMEGLUMINE 529 MG/ML IV SOLN
20.0000 mL | Freq: Once | INTRAVENOUS | Status: AC | PRN
  Administered 2016-11-08: 19 mL via INTRAVENOUS

## 2016-11-08 NOTE — Progress Notes (Signed)
  Radiation Oncology         [phone]   Name: Alexis Fox MRN: [id number]   Date: 11/08/2016  DOB: October 01, 1956     Weekly Radiation Therapy Management    ICD-9-CM ICD-10-CM   1. Breast cancer of upper-outer quadrant of left female breast ([place]) 174.4 C50.412     Current Dose: 21.6 Gy  Planned Dose:  50.4 Gy  Narrative The patient presents for routine under treatment assessment.  Previous GI symptoms resolved after she stopped taking Turmeric. She will have an MRI of her foot later today, unrelated to treatment. She has no major concerns or complaints related to treatment today.  Set-up films were reviewed. The chart was checked.  Physical Findings  oral temperature is 98.9 F (37.2 C). Her blood pressure is 118/65 and her pulse is 79. . Weight essentially stable.  No significant changes.    Impression The patient is tolerating radiation.   Plan Continue treatment as planned.       Sheral Apley Tammi Klippel, M.D.  This document serves as a record of services personally performed by Tyler Pita, MD. It was created on his behalf by Arlyce Harman, a trained medical scribe. The creation of this record is based on the scribe's personal observations and the provider's statements to them. This document has been checked and approved by the attending provider.

## 2016-11-08 NOTE — Progress Notes (Signed)
Patient  Room 8, has MRI due at noon, vitals taken, then MD stepped in, no assessment made 11:30 AM

## 2016-11-09 ENCOUNTER — Telehealth: Payer: Self-pay | Admitting: Radiation Oncology

## 2016-11-09 ENCOUNTER — Ambulatory Visit
Admission: RE | Admit: 2016-11-09 | Discharge: 2016-11-09 | Disposition: A | Discharge status: Home / Self Care | Payer: 59 | Source: Ambulatory Visit | Attending: Radiation Oncology | Admitting: Radiation Oncology

## 2016-11-09 DIAGNOSIS — Z9221 Personal history of antineoplastic chemotherapy: Secondary | ICD-10-CM | POA: Diagnosis not present

## 2016-11-09 DIAGNOSIS — Z510 Encounter for antineoplastic radiation therapy: Secondary | ICD-10-CM | POA: Diagnosis not present

## 2016-11-09 DIAGNOSIS — M179 Osteoarthritis of knee, unspecified: Secondary | ICD-10-CM | POA: Diagnosis not present

## 2016-11-09 DIAGNOSIS — F419 Anxiety disorder, unspecified: Secondary | ICD-10-CM | POA: Diagnosis not present

## 2016-11-09 DIAGNOSIS — F329 Major depressive disorder, single episode, unspecified: Secondary | ICD-10-CM | POA: Diagnosis not present

## 2016-11-09 DIAGNOSIS — C50412 Malignant neoplasm of upper-outer quadrant of left female breast: Secondary | ICD-10-CM | POA: Diagnosis not present

## 2016-11-09 DIAGNOSIS — Z171 Estrogen receptor negative status [ER-]: Secondary | ICD-10-CM | POA: Diagnosis not present

## 2016-11-09 DIAGNOSIS — C773 Secondary and unspecified malignant neoplasm of axilla and upper limb lymph nodes: Secondary | ICD-10-CM | POA: Diagnosis not present

## 2016-11-09 DIAGNOSIS — Z79899 Other long term (current) drug therapy: Secondary | ICD-10-CM | POA: Diagnosis not present

## 2016-11-09 NOTE — Telephone Encounter (Signed)
Placed complete [employer] paperwork in [place], [place] inbox to sign.

## 2016-11-12 ENCOUNTER — Ambulatory Visit (INDEPENDENT_AMBULATORY_CARE_PROVIDER_SITE_OTHER): Payer: 59 | Admitting: Orthopaedic Surgery

## 2016-11-12 ENCOUNTER — Ambulatory Visit
Admission: RE | Admit: 2016-11-12 | Discharge: 2016-11-12 | Disposition: A | Discharge status: Home / Self Care | Payer: 59 | Source: Ambulatory Visit | Attending: Radiation Oncology | Admitting: Radiation Oncology

## 2016-11-12 ENCOUNTER — Encounter (HOSPITAL_BASED_OUTPATIENT_CLINIC_OR_DEPARTMENT_OTHER): Payer: Self-pay | Admitting: *Deleted

## 2016-11-12 DIAGNOSIS — Z171 Estrogen receptor negative status [ER-]: Secondary | ICD-10-CM | POA: Diagnosis not present

## 2016-11-12 DIAGNOSIS — M25572 Pain in left ankle and joints of left foot: Secondary | ICD-10-CM

## 2016-11-12 DIAGNOSIS — Z510 Encounter for antineoplastic radiation therapy: Secondary | ICD-10-CM | POA: Diagnosis not present

## 2016-11-12 DIAGNOSIS — F329 Major depressive disorder, single episode, unspecified: Secondary | ICD-10-CM | POA: Diagnosis not present

## 2016-11-12 DIAGNOSIS — F419 Anxiety disorder, unspecified: Secondary | ICD-10-CM | POA: Diagnosis not present

## 2016-11-12 DIAGNOSIS — C773 Secondary and unspecified malignant neoplasm of axilla and upper limb lymph nodes: Secondary | ICD-10-CM | POA: Diagnosis not present

## 2016-11-12 DIAGNOSIS — Z9221 Personal history of antineoplastic chemotherapy: Secondary | ICD-10-CM | POA: Diagnosis not present

## 2016-11-12 DIAGNOSIS — Z79899 Other long term (current) drug therapy: Secondary | ICD-10-CM | POA: Diagnosis not present

## 2016-11-12 DIAGNOSIS — M179 Osteoarthritis of knee, unspecified: Secondary | ICD-10-CM | POA: Diagnosis not present

## 2016-11-12 DIAGNOSIS — C50412 Malignant neoplasm of upper-outer quadrant of left female breast: Secondary | ICD-10-CM | POA: Diagnosis not present

## 2016-11-12 NOTE — Progress Notes (Signed)
Office Visit Note   Patient: Alexis Fox           Date of Birth: Apr 19, 1956           MRN: [id number] Visit Date: 11/12/2016              Requested by: [name], MD [address] [place], [place] [address] PCP: [name], MD   Assessment & Plan: Visit Diagnoses: Mild posterior tibial tendinitis left ankle  Plan: Equalizer boot with follow-up in 1 month . Plan to treat this with immobilization and hope that it simply resolves. Was no evidence of any other pathology other than mild tendinitis of 2 with the flexor tendons behind the medial malleolus.    Orders:  No orders of the defined types were placed in this encounter.  No orders of the defined types were placed in this encounter.     Procedures: No procedures performed   Clinical Data: No additional findings.   Subjective: No chief complaint on file.   Pt here today for MRI results.  Pain in left ankle has not changed since last visit.     Pain is localized about the medial malleolus of the left ankle. Occasionally there will be some mild swelling. No neurovascular compromise. No specific instability. Review of Systems   Objective: Vital Signs: There were no vitals taken for this visit.  Physical Exam  Ortho Exam left ankle exam with tenderness just inferior to the medial malleolus and slightly anterior. There is no skin induration. No ecchymosis. No pain over the malleolus. No significant pain behind the medial malleolus. No loss of motion. No crepitation. Neurovascular exam is intact. No edema behind the medial malleolus.  Specialty Comments:  No specialty comments available.  Imaging: No results found.   PMFS History: Patient Active Problem List   Diagnosis Date Noted  . Central line complication [id number]  . Genetic testing 05/15/2016  . Family history of breast cancer   . Family history of brain cancer   . Breast cancer of upper-outer quadrant of left female  breast ([place]) 03/20/2016  . Transaminitis 10/03/2015  . Symptomatic cholelithiasis 10/03/2015  . RUQ abdominal pain   . Depression 06/24/2013  . Restless leg syndrome 06/24/2013   Past Medical History:  Diagnosis Date  . Anxiety   . Arthritis    knees  . Breast cancer ([place])   . Breast cancer of upper-outer quadrant of left female breast ([place]) 03/20/2016  . Cholecystitis   . Complication of anesthesia    BP "crashes" post op  . Depression   . Family history of brain cancer   . Family history of breast cancer   . Hot flashes   . Restless leg syndrome     Family History  Problem Relation Age of Onset  . Heart disease Mother   . Pulmonary fibrosis Mother   . Hypertension Mother   . Cancer Father 70    astocytoma  . Hypertension Brother   . Lymphoma Maternal Aunt   . Anesthesia problems Neg Hx     Past Surgical History:  Procedure Laterality Date  . BUNIONECTOMY    . CHOLECYSTECTOMY N/A 10/04/2015   Procedure: LAPAROSCOPIC CHOLECYSTECTOMY WITH INTRAOPERATIVE CHOLANGIOGRAM;  Surgeon: Greer Pickerel, MD;  Location: [place];  Service: General;  Laterality: N/A;  . ELBOW SURGERY     right for epicondylitis 2010   . FOOT SURGERY     1983 -tarsal tunnel release  . IR GENERIC HISTORICAL  10/26/2016   IR CV LINE INJECTION 10/26/2016 [facility]-INTERV RAD  . LUMBAR DISC SURGERY  04/04/2012  . PORTACATH PLACEMENT Right 04/02/2016   Procedure: INSERTION PORT-A-CATH WITH [place];  Surgeon: Rolm Bookbinder, MD;  Location: [place];  Service: General;  Laterality: Right;  . RADIOACTIVE SEED GUIDED PARTIAL MASTECTOMY/AXILLARY SENTINEL NODE BIOPSY/AXILLARY NODE DISSECTION Left 09/12/2016   Procedure: LEFT BREAST SEED GUIDED LUMPECTOMY, LEFT AXILLARY SENTINEL NODE BIOPSY, LEFT SEED GUIDED AXILLARY NODE EXCISION( TARGETED AXILLARY DISSECTION), BLUE DYE INJECTION;  Surgeon: Rolm Bookbinder, MD;  Location: [place];  Service: General;  Laterality: Left;  . SPINAL FUSION  5/12     L5-S1  . TARSAL TUNNEL RELEASE     Social History   Occupational History  . Not on file.   Social History Main Topics  . Smoking status: Never Smoker  . Smokeless tobacco: Never Used  . Alcohol use No  . Drug use: No  . Sexual activity: Yes    Birth control/ protection: Post-menopausal

## 2016-11-13 ENCOUNTER — Encounter: Payer: 59 | Admitting: Physical Therapy

## 2016-11-13 ENCOUNTER — Ambulatory Visit
Admission: RE | Admit: 2016-11-13 | Discharge: 2016-11-13 | Disposition: A | Discharge status: Home / Self Care | Payer: 59 | Source: Ambulatory Visit | Attending: Radiation Oncology | Admitting: Radiation Oncology

## 2016-11-13 ENCOUNTER — Encounter: Payer: Self-pay | Admitting: Radiation Oncology

## 2016-11-13 DIAGNOSIS — F329 Major depressive disorder, single episode, unspecified: Secondary | ICD-10-CM | POA: Diagnosis not present

## 2016-11-13 DIAGNOSIS — C773 Secondary and unspecified malignant neoplasm of axilla and upper limb lymph nodes: Secondary | ICD-10-CM | POA: Diagnosis not present

## 2016-11-13 DIAGNOSIS — F419 Anxiety disorder, unspecified: Secondary | ICD-10-CM | POA: Diagnosis not present

## 2016-11-13 DIAGNOSIS — Z79899 Other long term (current) drug therapy: Secondary | ICD-10-CM | POA: Diagnosis not present

## 2016-11-13 DIAGNOSIS — Z510 Encounter for antineoplastic radiation therapy: Secondary | ICD-10-CM | POA: Diagnosis not present

## 2016-11-13 DIAGNOSIS — Z171 Estrogen receptor negative status [ER-]: Secondary | ICD-10-CM | POA: Diagnosis not present

## 2016-11-13 DIAGNOSIS — M179 Osteoarthritis of knee, unspecified: Secondary | ICD-10-CM | POA: Diagnosis not present

## 2016-11-13 DIAGNOSIS — Z9221 Personal history of antineoplastic chemotherapy: Secondary | ICD-10-CM | POA: Diagnosis not present

## 2016-11-13 DIAGNOSIS — C50412 Malignant neoplasm of upper-outer quadrant of left female breast: Secondary | ICD-10-CM | POA: Diagnosis not present

## 2016-11-13 DIAGNOSIS — R05 Cough: Secondary | ICD-10-CM | POA: Diagnosis not present

## 2016-11-13 MED FILL — HYDROCODONE-CHLORPHENIRAM S: 10-8 | 10 days supply | Qty: 100 | Fill #0

## 2016-11-13 NOTE — Progress Notes (Signed)
Paperwork [place]) received from doctor, faxed to [phone], confirmation received, copy mailed to patient

## 2016-11-14 ENCOUNTER — Ambulatory Visit
Admission: RE | Admit: 2016-11-14 | Discharge: 2016-11-14 | Disposition: A | Discharge status: Home / Self Care | Payer: 59 | Source: Ambulatory Visit | Attending: Radiation Oncology | Admitting: Radiation Oncology

## 2016-11-14 ENCOUNTER — Encounter: Payer: Self-pay | Admitting: Radiation Oncology

## 2016-11-14 VITALS — BP 114/64 | HR 76 | Resp 16 | Wt 200.1 lb

## 2016-11-14 DIAGNOSIS — Z9221 Personal history of antineoplastic chemotherapy: Secondary | ICD-10-CM | POA: Diagnosis not present

## 2016-11-14 DIAGNOSIS — M179 Osteoarthritis of knee, unspecified: Secondary | ICD-10-CM | POA: Diagnosis not present

## 2016-11-14 DIAGNOSIS — F419 Anxiety disorder, unspecified: Secondary | ICD-10-CM | POA: Diagnosis not present

## 2016-11-14 DIAGNOSIS — F329 Major depressive disorder, single episode, unspecified: Secondary | ICD-10-CM | POA: Diagnosis not present

## 2016-11-14 DIAGNOSIS — C50412 Malignant neoplasm of upper-outer quadrant of left female breast: Secondary | ICD-10-CM

## 2016-11-14 DIAGNOSIS — Z510 Encounter for antineoplastic radiation therapy: Secondary | ICD-10-CM | POA: Diagnosis not present

## 2016-11-14 DIAGNOSIS — Z171 Estrogen receptor negative status [ER-]: Secondary | ICD-10-CM | POA: Diagnosis not present

## 2016-11-14 DIAGNOSIS — Z79899 Other long term (current) drug therapy: Secondary | ICD-10-CM | POA: Diagnosis not present

## 2016-11-14 DIAGNOSIS — C773 Secondary and unspecified malignant neoplasm of axilla and upper limb lymph nodes: Secondary | ICD-10-CM | POA: Diagnosis not present

## 2016-11-14 NOTE — Progress Notes (Signed)
  Radiation Oncology         [phone]  Name: Alexis Fox MRN: [id number]   Date: 11/14/2016  DOB: 1956/01/28   Weekly Radiation Therapy Management    ICD-9-CM ICD-10-CM   1. Breast cancer of upper-outer quadrant of left female breast ([place]) 174.4 C50.412     Current Dose: 28.8 Gy  Planned Dose:  50.4 Gy  Narrative The patient presents for routine under treatment assessment.  Weight and vitals stable. Denies pain. The patient reports moderated fatigue; she is uncertain if it is related to respiratory illness or exertion. She is fighting cold and congestion. The patient was seen by PCP at [facility] yesterday and was prescribed Abreva, antihistamine, and cough medication. She reports using Radiaplex bid as directed. She also reports she will have her PAC removed tomorrow.  The patient is without complaint. Set-up films were reviewed. The chart was checked.  Physical Findings  weight is 200 lb 1.6 oz (90.8 kg). Her blood pressure is 114/64 and her pulse is 76. Her respiration is 16 and oxygen saturation is 100%.  Weight essentially stable.  No significant changes. Minimal erythema noted in the treatment field.  Impression The patient is tolerating radiation.  Plan Continue treatment as planned.     Sheral Apley Tammi Klippel, M.D.  This document serves as a record of services personally performed by Tyler Pita, MD. It was created on his behalf by Maryla Morrow, a trained medical scribe. The creation of this record is based on the scribe's personal observations and the provider's statements to them. This document has been checked and approved by the attending provider.

## 2016-11-14 NOTE — Progress Notes (Signed)
Weight and vitals stable. Denies pain. No evidence of lymphedema noted. No skin changes noted within treatment field. Reports using radiaplex bid as directed. Fighting cold and congestion. Seen by PCP at [facility] yesterday and prescribed Abreva, antihistamine, cough medication. Reports moderated fatigue uncertain if related to respiratory illness or xrt.    BP 114/64 (BP Location: Right Arm, Patient Position: Sitting, Cuff Size: Large)   Pulse 76   Resp 16   Wt 200 lb 1.6 oz (90.8 kg)   SpO2 100%   BMI 34.35 kg/m  Wt Readings from Last 3 Encounters:  11/14/16 200 lb 1.6 oz (90.8 kg)  11/02/16 200 lb (90.7 kg)  10/24/16 198 lb 6.4 oz (90 kg)

## 2016-11-15 ENCOUNTER — Ambulatory Visit
Admission: RE | Admit: 2016-11-15 | Discharge: 2016-11-15 | Disposition: A | Discharge status: Home / Self Care | Payer: 59 | Source: Ambulatory Visit | Attending: Radiation Oncology | Admitting: Radiation Oncology

## 2016-11-15 ENCOUNTER — Encounter (HOSPITAL_BASED_OUTPATIENT_CLINIC_OR_DEPARTMENT_OTHER): Payer: Self-pay | Admitting: Anesthesiology

## 2016-11-15 ENCOUNTER — Ambulatory Visit (HOSPITAL_BASED_OUTPATIENT_CLINIC_OR_DEPARTMENT_OTHER): Payer: 59 | Admitting: Anesthesiology

## 2016-11-15 ENCOUNTER — Encounter (HOSPITAL_BASED_OUTPATIENT_CLINIC_OR_DEPARTMENT_OTHER)
Admission: RE | Disposition: A | Discharge status: Home / Self Care | Payer: Self-pay | Source: Ambulatory Visit | Attending: General Surgery

## 2016-11-15 ENCOUNTER — Ambulatory Visit (HOSPITAL_BASED_OUTPATIENT_CLINIC_OR_DEPARTMENT_OTHER)
Admission: RE | Admit: 2016-11-15 | Discharge: 2016-11-15 | Disposition: A | Discharge status: Home / Self Care | Payer: 59 | Source: Ambulatory Visit | Attending: General Surgery | Admitting: General Surgery

## 2016-11-15 DIAGNOSIS — Z452 Encounter for adjustment and management of vascular access device: Secondary | ICD-10-CM | POA: Diagnosis not present

## 2016-11-15 DIAGNOSIS — C50919 Malignant neoplasm of unspecified site of unspecified female breast: Secondary | ICD-10-CM | POA: Diagnosis not present

## 2016-11-15 DIAGNOSIS — M170 Bilateral primary osteoarthritis of knee: Secondary | ICD-10-CM | POA: Diagnosis not present

## 2016-11-15 DIAGNOSIS — Z510 Encounter for antineoplastic radiation therapy: Secondary | ICD-10-CM | POA: Diagnosis not present

## 2016-11-15 DIAGNOSIS — Z9221 Personal history of antineoplastic chemotherapy: Secondary | ICD-10-CM | POA: Diagnosis not present

## 2016-11-15 DIAGNOSIS — G2581 Restless legs syndrome: Secondary | ICD-10-CM | POA: Insufficient documentation

## 2016-11-15 DIAGNOSIS — F419 Anxiety disorder, unspecified: Secondary | ICD-10-CM | POA: Insufficient documentation

## 2016-11-15 DIAGNOSIS — Z853 Personal history of malignant neoplasm of breast: Secondary | ICD-10-CM | POA: Diagnosis not present

## 2016-11-15 DIAGNOSIS — Z9889 Other specified postprocedural states: Secondary | ICD-10-CM | POA: Diagnosis not present

## 2016-11-15 DIAGNOSIS — C773 Secondary and unspecified malignant neoplasm of axilla and upper limb lymph nodes: Secondary | ICD-10-CM | POA: Diagnosis not present

## 2016-11-15 DIAGNOSIS — F329 Major depressive disorder, single episode, unspecified: Secondary | ICD-10-CM | POA: Diagnosis not present

## 2016-11-15 DIAGNOSIS — Z79899 Other long term (current) drug therapy: Secondary | ICD-10-CM | POA: Insufficient documentation

## 2016-11-15 DIAGNOSIS — Z9102 Food additives allergy status: Secondary | ICD-10-CM | POA: Insufficient documentation

## 2016-11-15 DIAGNOSIS — Z171 Estrogen receptor negative status [ER-]: Secondary | ICD-10-CM | POA: Diagnosis not present

## 2016-11-15 DIAGNOSIS — Z888 Allergy status to other drugs, medicaments and biological substances status: Secondary | ICD-10-CM | POA: Insufficient documentation

## 2016-11-15 DIAGNOSIS — Z9049 Acquired absence of other specified parts of digestive tract: Secondary | ICD-10-CM | POA: Insufficient documentation

## 2016-11-15 DIAGNOSIS — Z803 Family history of malignant neoplasm of breast: Secondary | ICD-10-CM | POA: Insufficient documentation

## 2016-11-15 DIAGNOSIS — Z808 Family history of malignant neoplasm of other organs or systems: Secondary | ICD-10-CM | POA: Insufficient documentation

## 2016-11-15 DIAGNOSIS — M179 Osteoarthritis of knee, unspecified: Secondary | ICD-10-CM | POA: Diagnosis not present

## 2016-11-15 DIAGNOSIS — C50412 Malignant neoplasm of upper-outer quadrant of left female breast: Secondary | ICD-10-CM | POA: Diagnosis not present

## 2016-11-15 HISTORY — PX: PORT-A-CATH REMOVAL: SHX5289

## 2016-11-15 SURGERY — REMOVAL PORT-A-CATH
Anesthesia: Monitor Anesthesia Care | Site: Chest

## 2016-11-15 MED ORDER — MIDAZOLAM HCL 2 MG/2ML IJ SOLN
INTRAMUSCULAR | Status: AC
  Filled 2016-11-15: qty 2

## 2016-11-15 MED ORDER — FENTANYL CITRATE (PF) 100 MCG/2ML IJ SOLN
50.0000 ug | INTRAMUSCULAR | Status: DC | PRN
  Administered 2016-11-15: 50 ug via INTRAVENOUS

## 2016-11-15 MED ORDER — FENTANYL CITRATE (PF) 100 MCG/2ML IJ SOLN
INTRAMUSCULAR | Status: AC
  Filled 2016-11-15: qty 2

## 2016-11-15 MED ORDER — LIDOCAINE 2% (20 MG/ML) 5 ML SYRINGE
INTRAMUSCULAR | Status: DC | PRN
  Administered 2016-11-15: 40 mg via INTRAVENOUS

## 2016-11-15 MED ORDER — ONDANSETRON HCL 4 MG/2ML IJ SOLN: 4.0000 mg | Freq: Once | INTRAMUSCULAR | Status: DC | PRN

## 2016-11-15 MED ORDER — FENTANYL CITRATE (PF) 100 MCG/2ML IJ SOLN: 25.0000 ug | INTRAMUSCULAR | Status: DC | PRN

## 2016-11-15 MED ORDER — PROPOFOL 10 MG/ML IV BOLUS
INTRAVENOUS | Status: AC
Start: 2016-11-15 — End: 2016-11-15
  Filled 2016-11-15: qty 20

## 2016-11-15 MED ORDER — PROPOFOL 500 MG/50ML IV EMUL
INTRAVENOUS | Status: DC | PRN
Start: 2016-11-15 — End: 2016-11-15
  Administered 2016-11-15: 50 ug/kg/min via INTRAVENOUS

## 2016-11-15 MED ORDER — MIDAZOLAM HCL 2 MG/2ML IJ SOLN
1.0000 mg | INTRAMUSCULAR | Status: DC | PRN
  Administered 2016-11-15 (×2): 1 mg via INTRAVENOUS

## 2016-11-15 MED ORDER — LIDOCAINE 2% (20 MG/ML) 5 ML SYRINGE
INTRAMUSCULAR | Status: AC
  Filled 2016-11-15: qty 5

## 2016-11-15 MED ORDER — ONDANSETRON HCL 4 MG/2ML IJ SOLN
INTRAMUSCULAR | Status: AC
  Filled 2016-11-15: qty 2

## 2016-11-15 MED ORDER — DEXAMETHASONE SODIUM PHOSPHATE 10 MG/ML IJ SOLN
INTRAMUSCULAR | Status: AC
  Filled 2016-11-15: qty 1

## 2016-11-15 MED ORDER — SCOPOLAMINE 1 MG/3DAYS TD PT72: 1.0000 | MEDICATED_PATCH | Freq: Once | TRANSDERMAL | Status: DC | PRN

## 2016-11-15 MED ORDER — LACTATED RINGERS IV SOLN
INTRAVENOUS | Status: DC
  Administered 2016-11-15: 14:00:00 via INTRAVENOUS

## 2016-11-15 MED ORDER — BUPIVACAINE HCL (PF) 0.25 % IJ SOLN
INTRAMUSCULAR | Status: DC | PRN
  Administered 2016-11-15: 10 mL

## 2016-11-15 SURGICAL SUPPLY — 32 items
BLADE SURG 15 STRL LF DISP TIS (BLADE) ×1 IMPLANT
BLADE SURG 15 STRL SS (BLADE) ×2
CHLORAPREP W/TINT 26ML (MISCELLANEOUS) ×3 IMPLANT
COVER BACK TABLE 60X90IN (DRAPES) ×3 IMPLANT
COVER MAYO STAND STRL (DRAPES) ×3 IMPLANT
DECANTER SPIKE VIAL GLASS SM (MISCELLANEOUS) ×3 IMPLANT
DERMABOND ADVANCED (GAUZE/BANDAGES/DRESSINGS) ×2
DERMABOND ADVANCED .7 DNX12 (GAUZE/BANDAGES/DRESSINGS) ×1 IMPLANT
DRAPE LAPAROTOMY 100X72 PEDS (DRAPES) ×3 IMPLANT
DRAPE UTILITY XL STRL (DRAPES) ×3 IMPLANT
ELECT COATED BLADE 2.86 ST (ELECTRODE) ×3 IMPLANT
ELECT REM PT RETURN 9FT ADLT (ELECTROSURGICAL) ×3
ELECTRODE REM PT RTRN 9FT ADLT (ELECTROSURGICAL) ×1 IMPLANT
GLOVE BIO SURGEON STRL SZ7 (GLOVE) ×3 IMPLANT
GLOVE BIOGEL PI IND STRL 6.5 (GLOVE) ×2 IMPLANT
GLOVE BIOGEL PI IND STRL 7.5 (GLOVE) ×1 IMPLANT
GLOVE BIOGEL PI INDICATOR 6.5 (GLOVE) ×4
GLOVE BIOGEL PI INDICATOR 7.5 (GLOVE) ×2
GLOVE ECLIPSE 6.5 STRL STRAW (GLOVE) ×3 IMPLANT
GOWN STRL REUS W/ TWL LRG LVL3 (GOWN DISPOSABLE) ×2 IMPLANT
GOWN STRL REUS W/TWL LRG LVL3 (GOWN DISPOSABLE) ×4
NEEDLE HYPO 25X1 1.5 SAFETY (NEEDLE) ×3 IMPLANT
PACK BASIN DAY SURGERY FS (CUSTOM PROCEDURE TRAY) ×3 IMPLANT
PENCIL BUTTON HOLSTER BLD 10FT (ELECTRODE) ×3 IMPLANT
SLEEVE SCD COMPRESS KNEE MED (MISCELLANEOUS) IMPLANT
SUT MNCRL AB 4-0 PS2 18 (SUTURE) ×3 IMPLANT
SUT MON AB 4-0 PC3 18 (SUTURE) IMPLANT
SUT VIC AB 3-0 SH 27 (SUTURE) ×2
SUT VIC AB 3-0 SH 27X BRD (SUTURE) ×1 IMPLANT
SYR CONTROL 10ML LL (SYRINGE) ×3 IMPLANT
TOWEL OR 17X24 6PK STRL BLUE (TOWEL DISPOSABLE) ×3 IMPLANT
TOWEL OR NON WOVEN STRL DISP B (DISPOSABLE) ×3 IMPLANT

## 2016-11-15 NOTE — Anesthesia Preprocedure Evaluation (Addendum)
Anesthesia Evaluation  Patient identified by MRN, date of birth, ID band Patient awake    Reviewed: Allergy & Precautions, NPO status , Patient's Chart, lab work & pertinent test results  History of Anesthesia Complications (+) history of anesthetic complications ("low BP post-op")  Airway Mallampati: II  TM Distance: >3 FB Neck ROM: Full    Dental  (+) Teeth Intact, Dental Advisory Given   Pulmonary neg pulmonary ROS,    breath sounds clear to auscultation       Cardiovascular Exercise Tolerance: Good + Peripheral Vascular Disease   Rhythm:Regular Rate:Normal     Neuro/Psych PSYCHIATRIC DISORDERS Anxiety Depression RLS    GI/Hepatic negative GI ROS, Neg liver ROS,   Endo/Other  Obesity   Renal/GU negative Renal ROS     Musculoskeletal  (+) Arthritis , Osteoarthritis,    Abdominal   Peds  Hematology negative hematology ROS (+)   Anesthesia Other Findings Day of surgery medications reviewed with the patient.  Breast cancer  Reproductive/Obstetrics                            Anesthesia Physical  Anesthesia Plan  ASA: II  Anesthesia Plan: MAC   Post-op Pain Management:    Induction: Intravenous  Airway Management Planned: Nasal Cannula  Additional Equipment:   Intra-op Plan:   Post-operative Plan:   Informed Consent: I have reviewed the patients History and Physical, chart, labs and discussed the procedure including the risks, benefits and alternatives for the proposed anesthesia with the patient or authorized representative who has indicated his/her understanding and acceptance.   Dental advisory given  Plan Discussed with: CRNA, Anesthesiologist and Surgeon  Anesthesia Plan Comments: (Discussed risks/benefits/alternatives to MAC sedation including need for ventilatory support, hypotension, need for conversion to general anesthesia.  All patient questions answered.   Patient/guardian wishes to proceed.)       Anesthesia Quick Evaluation

## 2016-11-15 NOTE — Interval H&P Note (Signed)
History and Physical Interval Note:  11/15/2016 1:52 PM  Alexis Fox  has presented today for surgery, with the diagnosis of Breast cancer  The various methods of treatment have been discussed with the patient and family. After consideration of risks, benefits and other options for treatment, the patient has consented to  Procedure(s): REMOVAL PORT-A-CATH (N/A) as a surgical intervention .  The patient's history has been reviewed, patient examined, no change in status, stable for surgery.  I have reviewed the patient's chart and labs.  Questions were answered to the patient's satisfaction.     Witt Plitt

## 2016-11-15 NOTE — Transfer of Care (Signed)
Immediate Anesthesia Transfer of Care Note  Patient: Alexis Fox  Procedure(s) Performed: Procedure(s): REMOVAL PORT-A-CATH (N/A)  Patient Location: PACU  Anesthesia Type:MAC  Level of Consciousness: awake, oriented, sedated and patient cooperative  Airway & Oxygen Therapy: Patient Spontanous Breathing  Post-op Assessment: Report given to RN and Post -op Vital signs reviewed and stable  Post vital signs: Reviewed and stable  Last Vitals:  Vitals:   11/15/16 1310  BP: 117/61  Pulse: (!) 58  Resp: 16  Temp: 37 C    Last Pain:  Vitals:   11/15/16 1310  TempSrc: Oral         Complications: No apparent anesthesia complications

## 2016-11-15 NOTE — Anesthesia Postprocedure Evaluation (Signed)
Anesthesia Post Note  Patient: Alexis Fox  Procedure(s) Performed: Procedure(s) (LRB): REMOVAL PORT-A-CATH (N/A)  Patient location during evaluation: PACU Anesthesia Type: MAC Level of consciousness: awake and alert Pain management: pain level controlled Vital Signs Assessment: post-procedure vital signs reviewed and stable Respiratory status: spontaneous breathing, nonlabored ventilation, respiratory function stable and patient connected to nasal cannula oxygen Cardiovascular status: stable and blood pressure returned to baseline Anesthetic complications: no    Last Vitals:  Vitals:   11/15/16 1500 11/15/16 1528  BP: 98/67 119/71  Pulse: 63 63  Resp: 17 16  Temp:  36.7 C    Last Pain:  Vitals:   11/15/16 1528  TempSrc:   PainSc: 0-No pain                 Catalina Gravel

## 2016-11-15 NOTE — Discharge Instructions (Signed)
Always review your discharge instruction sheet given to you by the facility where your surgery was performed.   1. A prescription for pain medication may be given to you upon discharge. Take your pain medication as prescribed, if needed. If narcotic pain medicine is not needed, then you make take acetaminophen (Tylenol) or ibuprofen (Advil) as needed.  2. Take your usually prescribed medications unless otherwise directed. 3. If you need a refill on your pain medication, please contact our office. All narcotic pain medicine now requires a paper prescription.  Phoned in and fax refills are no longer allowed by law.  Prescriptions will not be filled after 5 pm or on weekends.  4. You should follow a light diet for the remainder of the day after your procedure. 5. Most patients will experience some mild swelling and/or bruising in the area of the incision. It may take several days to resolve. 6. It is common to experience some constipation if taking pain medication after surgery. Increasing fluid intake and taking a stool softener (such as Colace) will usually help or prevent this problem from occurring. A mild laxative (Milk of Magnesia or Miralax) should be taken according to package directions if there are no bowel movements after 48 hours.  7. Unless discharge instructions indicate otherwise, you may remove your bandages 48 hours after surgery, and you may shower at that time. You may have steri-strips (small white skin tapes) in place directly over the incision.  These strips should be left on the skin for 7-10 days.  If your surgeon used Dermabond (skin glue) on the incision, you may shower in 24 hours.  The glue will flake off over the next 2-3 weeks.  10.You may need to see your doctor in the office for a follow-up appointment.  Please       check with your doctor.    WHEN TO CALL YOUR DOCTOR [phone]): 1. Fever over 101.0 2. Chills 3. Continued bleeding from incision 4. Increased  redness and tenderness at the site 5. Shortness of breath, difficulty breathing   The clinic staff is available to answer your questions during regular business hours. Please dont hesitate to call and ask to speak to one of the nurses or medical assistants for clinical concerns. If you have a medical emergency, go to the nearest emergency room or call 911.  A surgeon from [facility] is always on call at the hospital.     For further information, please visit [email or web address]     Post Anesthesia Home Care Instructions  Activity: Get plenty of rest for the remainder of the day. A responsible adult should stay with you for 24 hours following the procedure.  For the next 24 hours, DO NOT: -Drive a car -Paediatric nurse -Drink alcoholic beverages -Take any medication unless instructed by your physician -Make any legal decisions or sign important papers.  Meals: Start with liquid foods such as gelatin or soup. Progress to regular foods as tolerated. Avoid greasy, spicy, heavy foods. If nausea and/or vomiting occur, drink only clear liquids until the nausea and/or vomiting subsides. Call your physician if vomiting continues.  Special Instructions/Symptoms: Your throat may feel dry or sore from the anesthesia or the breathing tube placed in your throat during surgery. If this causes discomfort, gargle with warm salt water. The discomfort should disappear within 24 hours.  If you had a scopolamine patch placed behind your ear for the management of post- operative nausea and/or vomiting:  1. The  medication in the patch is effective for 72 hours, after which it should be removed.  Wrap patch in a tissue and discard in the trash. Wash hands thoroughly with soap and water. 2. You may remove the patch earlier than 72 hours if you experience unpleasant side effects which may include dry mouth, dizziness or visual disturbances. 3. Avoid touching the patch. Wash your  hands with soap and water after contact with the patch.

## 2016-11-15 NOTE — Op Note (Signed)
Preoperative diagnosis: Breast cancer no longer needs venous access Postoperative diagnosis: Same as above Procedure: Port removal Surgeon: Dr. Serita Grammes Anesthesia: IV sedation with local Consultations: None Drains: None Specimens: None Estimated blood loss: Minimal Sponge count correct at completion Disposition to recovery in stable condition  Indications: This is a 60 year old female who has undergone treatment for breast cancer. She no longer needs venous access. We discussed port removal.  Procedure: After informed consent was obtained the patient was taken to the operating room. She was placed under monitored anesthesia care. She was prepped and draped in the standard sterile surgical fashion. A surgical timeout was then performed.  I infiltrated the area of the incision and the port with Marcaine. I then reentered her old incision. The port and the line were removed in their entirety. There was no more foreign body present. Hemostasis was observed. I closed the incision with 3-0 Vicryl and 4-0 Monocryl. Glue was placed over that. She tolerated this well and was transferred to recovery stable.

## 2016-11-15 NOTE — H&P (Signed)
Alexis Fox is an 60 y.o. female.   Chief Complaint: breast cancer HPI:  31 yof who underwent primary chemotherapy and had positive nodes upon diagnosis. she has now undergone left lumpectomy, left TAD. she is doing well except for some soreness in inner arm and shoulder. she had two largest masses prior to chemo measuring 2.5 and 2.7 cm. the entire abnl group measured 6.4 cm. her final path showed a 0.5 cm idc and 1 cm of dcis with treatment effect. margins are negative. she had 3 nodes removed from tad that were sentinel nodes and 1/3 were positive. one additional sentinel node was negative. her rcb class is II. done with radiotherapy and now for port removal.  Past Medical History:  Diagnosis Date  . Anxiety   . Arthritis    knees  . Breast cancer ([place])   . Breast cancer of upper-outer quadrant of left female breast ([place]) 03/20/2016  . Cholecystitis   . Complication of anesthesia    BP "crashes" post op  . Depression   . Family history of brain cancer   . Family history of breast cancer   . Hot flashes   . Restless leg syndrome     Past Surgical History:  Procedure Laterality Date  . BUNIONECTOMY    . CHOLECYSTECTOMY N/A 10/04/2015   Procedure: LAPAROSCOPIC CHOLECYSTECTOMY WITH INTRAOPERATIVE CHOLANGIOGRAM;  Surgeon: Greer Pickerel, MD;  Location: [facility];  Service: General;  Laterality: N/A;  . ELBOW SURGERY     right for epicondylitis 2010   . FOOT SURGERY     1983 -tarsal tunnel release  . IR GENERIC HISTORICAL  10/26/2016   IR CV LINE INJECTION 10/26/2016 [facility]-INTERV RAD  . LUMBAR DISC SURGERY  04/04/2012  . PORTACATH PLACEMENT Right 04/02/2016   Procedure: INSERTION PORT-A-CATH WITH [place];  Surgeon: Rolm Bookbinder, MD;  Location: [facility];  Service: General;  Laterality: Right;  . RADIOACTIVE SEED GUIDED PARTIAL MASTECTOMY/AXILLARY SENTINEL NODE BIOPSY/AXILLARY NODE DISSECTION Left 09/12/2016   Procedure: LEFT BREAST SEED GUIDED LUMPECTOMY, LEFT  AXILLARY SENTINEL NODE BIOPSY, LEFT SEED GUIDED AXILLARY NODE EXCISION( TARGETED AXILLARY DISSECTION), BLUE DYE INJECTION;  Surgeon: Rolm Bookbinder, MD;  Location: [facility];  Service: General;  Laterality: Left;  . SPINAL FUSION  5/12   L5-S1  . TARSAL TUNNEL RELEASE      Family History  Problem Relation Age of Onset  . Heart disease Mother   . Pulmonary fibrosis Mother   . Hypertension Mother   . Cancer Father 35    astocytoma  . Hypertension Brother   . Lymphoma Maternal Aunt   . Anesthesia problems Neg Hx    Social History:  reports that she has never smoked. She has never used smokeless tobacco. She reports that she does not drink alcohol or use drugs.  Allergies:  Allergies  Allergen Reactions  . Decadron [Dexamethasone]     Exacerbates restless leg  . Turmeric Other (See Comments)    Severe diarrhea    Medications Prior to Admission  Medication Sig Dispense Refill  . buPROPion (WELLBUTRIN XL) 150 MG 24 hr tablet Take 150 mg by mouth daily.  4  . chlorpheniramine (CHLOR-TRIMETON) 4 MG tablet Take 4 mg by mouth every 4 (four) hours as needed for allergies.    . chlorpheniramine-HYDROcodone (TUSSIONEX) 10-8 MG/5ML SUER Take 5 mLs by mouth every 12 (twelve) hours as needed for cough (took 1/2 dose this am).    . citalopram (CELEXA) 40 MG tablet  3  . clonazePAM (KLONOPIN) 1 MG tablet Take 1.5 mg by mouth at bedtime.     . famotidine (PEPCID) 20 MG tablet Take 20 mg by mouth 2 (two) times daily.    Marland Kitchen loratadine (CLARITIN) 10 MG tablet Take 10 mg by mouth daily.    Marland Kitchen nystatin-triamcinolone ointment (MYCOLOG) Apply 1 application topically 2 (two) times daily. 60 g 3  . pramipexole (MIRAPEX) 0.5 MG tablet Take 0.5 mg by mouth at bedtime.    Marland Kitchen PREVIDENT 5000 DRY MOUTH 1.1 % GEL dental gel   99  . Wound Cleansers (RADIAPLEX EX) Apply topically.    . non-metallic deodorant Jethro Poling) MISC Apply 1 application topically daily as needed.    Marland Kitchen oxyCODONE (OXY  IR/ROXICODONE) 5 MG immediate release tablet Take 1-2 tablets (5-10 mg total) by mouth every 6 (six) hours as needed for moderate pain, severe pain or breakthrough pain. (Patient not taking: Reported on 11/14/2016) 20 tablet 0  . RESTASIS 0.05 % ophthalmic emulsion Place 1 drop into both eyes 2 (two) times daily as needed. For dry eyes.  4    No results found for this or any previous visit (from the past 48 hour(s)). No results found.  ROS Negative  Blood pressure 117/61, pulse (!) 58, temperature 98.6 F (37 C), temperature source Oral, resp. rate 16, height 5\' 4"  (1.626 m), weight 91.3 kg (201 lb 4 oz), SpO2 99 %. Physical Exam  Port in place right side Left breast incision healing well cv rr pulm clear  Assessment/Plan Breast cancer, no longer needs venous access Port removal  Bentley Haralson, MD 11/15/2016, 1:43 PM

## 2016-11-16 ENCOUNTER — Encounter (HOSPITAL_BASED_OUTPATIENT_CLINIC_OR_DEPARTMENT_OTHER): Payer: Self-pay | Admitting: General Surgery

## 2016-11-16 ENCOUNTER — Ambulatory Visit
Admission: RE | Admit: 2016-11-16 | Discharge: 2016-11-16 | Disposition: A | Discharge status: Home / Self Care | Payer: 59 | Source: Ambulatory Visit | Attending: Radiation Oncology | Admitting: Radiation Oncology

## 2016-11-16 DIAGNOSIS — Z79899 Other long term (current) drug therapy: Secondary | ICD-10-CM | POA: Diagnosis not present

## 2016-11-16 DIAGNOSIS — Z9221 Personal history of antineoplastic chemotherapy: Secondary | ICD-10-CM | POA: Diagnosis not present

## 2016-11-16 DIAGNOSIS — C773 Secondary and unspecified malignant neoplasm of axilla and upper limb lymph nodes: Secondary | ICD-10-CM | POA: Diagnosis not present

## 2016-11-16 DIAGNOSIS — F329 Major depressive disorder, single episode, unspecified: Secondary | ICD-10-CM | POA: Diagnosis not present

## 2016-11-16 DIAGNOSIS — Z510 Encounter for antineoplastic radiation therapy: Secondary | ICD-10-CM | POA: Diagnosis not present

## 2016-11-16 DIAGNOSIS — C50412 Malignant neoplasm of upper-outer quadrant of left female breast: Secondary | ICD-10-CM | POA: Diagnosis not present

## 2016-11-16 DIAGNOSIS — M179 Osteoarthritis of knee, unspecified: Secondary | ICD-10-CM | POA: Diagnosis not present

## 2016-11-16 DIAGNOSIS — Z171 Estrogen receptor negative status [ER-]: Secondary | ICD-10-CM | POA: Diagnosis not present

## 2016-11-16 DIAGNOSIS — F419 Anxiety disorder, unspecified: Secondary | ICD-10-CM | POA: Diagnosis not present

## 2016-11-19 ENCOUNTER — Ambulatory Visit: Payer: 59

## 2016-11-19 ENCOUNTER — Ambulatory Visit
Admission: RE | Admit: 2016-11-19 | Discharge: 2016-11-19 | Disposition: A | Discharge status: Home / Self Care | Payer: 59 | Source: Ambulatory Visit | Attending: Radiation Oncology | Admitting: Radiation Oncology

## 2016-11-19 ENCOUNTER — Encounter: Payer: Self-pay | Admitting: *Deleted

## 2016-11-19 DIAGNOSIS — M179 Osteoarthritis of knee, unspecified: Secondary | ICD-10-CM | POA: Diagnosis not present

## 2016-11-19 DIAGNOSIS — F329 Major depressive disorder, single episode, unspecified: Secondary | ICD-10-CM | POA: Diagnosis not present

## 2016-11-19 DIAGNOSIS — Z79899 Other long term (current) drug therapy: Secondary | ICD-10-CM | POA: Diagnosis not present

## 2016-11-19 DIAGNOSIS — F419 Anxiety disorder, unspecified: Secondary | ICD-10-CM | POA: Diagnosis not present

## 2016-11-19 DIAGNOSIS — C50412 Malignant neoplasm of upper-outer quadrant of left female breast: Secondary | ICD-10-CM | POA: Diagnosis not present

## 2016-11-19 DIAGNOSIS — Z171 Estrogen receptor negative status [ER-]: Secondary | ICD-10-CM | POA: Diagnosis not present

## 2016-11-19 DIAGNOSIS — Z510 Encounter for antineoplastic radiation therapy: Secondary | ICD-10-CM | POA: Diagnosis not present

## 2016-11-19 DIAGNOSIS — Z9221 Personal history of antineoplastic chemotherapy: Secondary | ICD-10-CM | POA: Diagnosis not present

## 2016-11-19 DIAGNOSIS — C773 Secondary and unspecified malignant neoplasm of axilla and upper limb lymph nodes: Secondary | ICD-10-CM | POA: Diagnosis not present

## 2016-11-19 NOTE — Progress Notes (Signed)
11/19/16 at 11:14am- PREVENT- unscheduled visit- The pt was coming into the cancer center this morning for her radiation treatment.  The research nurse was in the lobby seeing another pt when the pt asked to talk to the research nurse.  The pt said that she developed muscles aches recently, and she decided to stop taking her study drug.  She apologized for not calling the study team about her new side effect.  She said she was sick and then developed mild muscle aches.  She said that she feared that the muscle aches might be related to her study drug so she decided to just stop taking her study drug for " a week".  She said that her muscle pain has resolved.  The pt was instructed to always call and let her study team know about any problems that cause her to stop taking her study drug.  The pt said that she has been "dealing with a lot lately" and stopping her study drug was the easiest thing to "lighten her load".  The pt denied taking any OTC NSAIDS/analgesics for her muscle pain. The pt was told that the protocol requires ALT, AST, and CK to be drawn within 24 hours of any grade 2/grade 3 myalgias notification of adverse event.  Dr. Lindi Adie was aware that it has been greater than 24 hours since the pt stopped her study drug, but he decided to have the blood work (ALT, AST, and CK) drawn as he deemed necessary to evaluate her adverse event further.   The pt was in agreement to have labs drawn.  The pt said that if the labs are normal tomorrow then she would reconsider re-starting her study drug.  The research nurse notified Burna Forts and Tommye Standard at [facility] about the pt stopping her study drug.   Brion Aliment RN, BSN, CCRP Clinical Research Nurse 11/19/2016 11:32 AM   11/20/16 at 10:14am- The pt's labs from [facility] were all within normal limits.  The results were reviewed by Dr. Lindi Adie.  He said that the pt should resume her study drug today and to call him immediately if she develops any muscle  aches/pain.  The research nurse then called the pt and informed her that Dr. Lindi Adie wants her to resume her study drug.  The pt was in agreement to resume her study drug today.  The pt stated that she would notify the study team if she experiences anymore problems.  The research nurse updated Burna Forts and Tommye Standard at [facility] that the pt resumed her study drug.   Brion Aliment RN, BSN, CCRP Clinical Research Nurse 11/20/2016 10:18 AM

## 2016-11-20 ENCOUNTER — Ambulatory Visit
Admission: RE | Admit: 2016-11-20 | Discharge: 2016-11-20 | Disposition: A | Discharge status: Home / Self Care | Payer: 59 | Source: Ambulatory Visit | Attending: Radiation Oncology | Admitting: Radiation Oncology

## 2016-11-20 ENCOUNTER — Encounter: Payer: 59 | Admitting: Physical Therapy

## 2016-11-20 DIAGNOSIS — Z9221 Personal history of antineoplastic chemotherapy: Secondary | ICD-10-CM | POA: Diagnosis not present

## 2016-11-20 DIAGNOSIS — C773 Secondary and unspecified malignant neoplasm of axilla and upper limb lymph nodes: Secondary | ICD-10-CM | POA: Diagnosis not present

## 2016-11-20 DIAGNOSIS — C50412 Malignant neoplasm of upper-outer quadrant of left female breast: Secondary | ICD-10-CM | POA: Diagnosis not present

## 2016-11-20 DIAGNOSIS — F419 Anxiety disorder, unspecified: Secondary | ICD-10-CM | POA: Diagnosis not present

## 2016-11-20 DIAGNOSIS — F329 Major depressive disorder, single episode, unspecified: Secondary | ICD-10-CM | POA: Diagnosis not present

## 2016-11-20 DIAGNOSIS — Z171 Estrogen receptor negative status [ER-]: Secondary | ICD-10-CM | POA: Diagnosis not present

## 2016-11-20 DIAGNOSIS — M179 Osteoarthritis of knee, unspecified: Secondary | ICD-10-CM | POA: Diagnosis not present

## 2016-11-20 DIAGNOSIS — Z79899 Other long term (current) drug therapy: Secondary | ICD-10-CM | POA: Diagnosis not present

## 2016-11-20 DIAGNOSIS — Z510 Encounter for antineoplastic radiation therapy: Secondary | ICD-10-CM | POA: Diagnosis not present

## 2016-11-20 MED ORDER — RADIAPLEXRX EX GEL
Freq: Once | CUTANEOUS | Status: AC
  Administered 2016-11-20: 11:00:00 via TOPICAL

## 2016-11-21 ENCOUNTER — Ambulatory Visit
Admission: RE | Admit: 2016-11-21 | Discharge: 2016-11-21 | Disposition: A | Discharge status: Home / Self Care | Payer: 59 | Source: Ambulatory Visit | Attending: Radiation Oncology | Admitting: Radiation Oncology

## 2016-11-21 DIAGNOSIS — M179 Osteoarthritis of knee, unspecified: Secondary | ICD-10-CM | POA: Diagnosis not present

## 2016-11-21 DIAGNOSIS — Z9221 Personal history of antineoplastic chemotherapy: Secondary | ICD-10-CM | POA: Diagnosis not present

## 2016-11-21 DIAGNOSIS — F329 Major depressive disorder, single episode, unspecified: Secondary | ICD-10-CM | POA: Diagnosis not present

## 2016-11-21 DIAGNOSIS — Z79899 Other long term (current) drug therapy: Secondary | ICD-10-CM | POA: Diagnosis not present

## 2016-11-21 DIAGNOSIS — Z171 Estrogen receptor negative status [ER-]: Secondary | ICD-10-CM | POA: Diagnosis not present

## 2016-11-21 DIAGNOSIS — Z510 Encounter for antineoplastic radiation therapy: Secondary | ICD-10-CM | POA: Diagnosis not present

## 2016-11-21 DIAGNOSIS — F419 Anxiety disorder, unspecified: Secondary | ICD-10-CM | POA: Diagnosis not present

## 2016-11-21 DIAGNOSIS — C50412 Malignant neoplasm of upper-outer quadrant of left female breast: Secondary | ICD-10-CM | POA: Diagnosis not present

## 2016-11-21 DIAGNOSIS — C773 Secondary and unspecified malignant neoplasm of axilla and upper limb lymph nodes: Secondary | ICD-10-CM | POA: Diagnosis not present

## 2016-11-22 ENCOUNTER — Ambulatory Visit
Admission: RE | Admit: 2016-11-22 | Discharge: 2016-11-22 | Disposition: A | Discharge status: Home / Self Care | Payer: 59 | Source: Ambulatory Visit | Attending: Radiation Oncology | Admitting: Radiation Oncology

## 2016-11-22 DIAGNOSIS — Z9221 Personal history of antineoplastic chemotherapy: Secondary | ICD-10-CM | POA: Diagnosis not present

## 2016-11-22 DIAGNOSIS — C50412 Malignant neoplasm of upper-outer quadrant of left female breast: Secondary | ICD-10-CM | POA: Diagnosis not present

## 2016-11-22 DIAGNOSIS — F419 Anxiety disorder, unspecified: Secondary | ICD-10-CM | POA: Diagnosis not present

## 2016-11-22 DIAGNOSIS — M179 Osteoarthritis of knee, unspecified: Secondary | ICD-10-CM | POA: Diagnosis not present

## 2016-11-22 DIAGNOSIS — F329 Major depressive disorder, single episode, unspecified: Secondary | ICD-10-CM | POA: Diagnosis not present

## 2016-11-22 DIAGNOSIS — Z79899 Other long term (current) drug therapy: Secondary | ICD-10-CM | POA: Diagnosis not present

## 2016-11-22 DIAGNOSIS — Z171 Estrogen receptor negative status [ER-]: Secondary | ICD-10-CM | POA: Diagnosis not present

## 2016-11-22 DIAGNOSIS — Z510 Encounter for antineoplastic radiation therapy: Secondary | ICD-10-CM | POA: Diagnosis not present

## 2016-11-22 DIAGNOSIS — C773 Secondary and unspecified malignant neoplasm of axilla and upper limb lymph nodes: Secondary | ICD-10-CM | POA: Diagnosis not present

## 2016-11-23 ENCOUNTER — Ambulatory Visit
Admission: RE | Admit: 2016-11-23 | Discharge: 2016-11-23 | Disposition: A | Discharge status: Home / Self Care | Payer: 59 | Source: Ambulatory Visit | Attending: Radiation Oncology | Admitting: Radiation Oncology

## 2016-11-23 ENCOUNTER — Encounter: Payer: Self-pay | Admitting: Radiation Oncology

## 2016-11-23 ENCOUNTER — Encounter (HOSPITAL_COMMUNITY): Payer: Self-pay

## 2016-11-23 DIAGNOSIS — Z79899 Other long term (current) drug therapy: Secondary | ICD-10-CM | POA: Diagnosis not present

## 2016-11-23 DIAGNOSIS — F419 Anxiety disorder, unspecified: Secondary | ICD-10-CM | POA: Diagnosis not present

## 2016-11-23 DIAGNOSIS — Z9221 Personal history of antineoplastic chemotherapy: Secondary | ICD-10-CM | POA: Diagnosis not present

## 2016-11-23 DIAGNOSIS — F329 Major depressive disorder, single episode, unspecified: Secondary | ICD-10-CM | POA: Diagnosis not present

## 2016-11-23 DIAGNOSIS — C50412 Malignant neoplasm of upper-outer quadrant of left female breast: Secondary | ICD-10-CM | POA: Diagnosis not present

## 2016-11-23 DIAGNOSIS — C773 Secondary and unspecified malignant neoplasm of axilla and upper limb lymph nodes: Secondary | ICD-10-CM | POA: Diagnosis not present

## 2016-11-23 DIAGNOSIS — M179 Osteoarthritis of knee, unspecified: Secondary | ICD-10-CM | POA: Diagnosis not present

## 2016-11-23 DIAGNOSIS — Z510 Encounter for antineoplastic radiation therapy: Secondary | ICD-10-CM | POA: Diagnosis not present

## 2016-11-23 DIAGNOSIS — Z171 Estrogen receptor negative status [ER-]: Secondary | ICD-10-CM | POA: Diagnosis not present

## 2016-11-23 NOTE — Progress Notes (Signed)
[place] [facility] mailed medical record request dated 11/13/2016. All available records from [facility] faxed to to provided # [phone] (22 pages total). Copy of request scanned ito patient's electronic medical record.  Renee Pain, RN

## 2016-11-23 NOTE — Progress Notes (Signed)
Weight and vitals stable. Denies pain. Reports great fatigue. Continued chest congestion and cough noted. Reports her PCP has prescribed Tussinex to manage cough. She plans to follow up with PCP should symptoms not resolve. Reports she was off her trial medication for one week but, resume them yesterday (atorvastatin). No hyperpigmentation or desquamation of left treatment breast noted. No evidence of lymphedema noted. Reports she continues to use radiaplex as directed.   BP 103/62 (BP Location: Right Arm, Patient Position: Sitting, Cuff Size: Normal)   Pulse 79   Temp 97.7 F (36.5 C) (Oral)   Resp 20   Wt 203 lb 6.4 oz (92.3 kg)   BMI 34.91 kg/m  Wt Readings from Last 3 Encounters:  11/23/16 203 lb 6.4 oz (92.3 kg)  11/15/16 201 lb 4 oz (91.3 kg)  11/14/16 200 lb 1.6 oz (90.8 kg)

## 2016-11-27 ENCOUNTER — Ambulatory Visit
Admission: RE | Admit: 2016-11-27 | Discharge: 2016-11-27 | Disposition: A | Discharge status: Home / Self Care | Payer: 59 | Source: Ambulatory Visit | Attending: Radiation Oncology | Admitting: Radiation Oncology

## 2016-11-27 DIAGNOSIS — F329 Major depressive disorder, single episode, unspecified: Secondary | ICD-10-CM | POA: Diagnosis not present

## 2016-11-27 DIAGNOSIS — Z79899 Other long term (current) drug therapy: Secondary | ICD-10-CM | POA: Diagnosis not present

## 2016-11-27 DIAGNOSIS — Z171 Estrogen receptor negative status [ER-]: Secondary | ICD-10-CM | POA: Diagnosis not present

## 2016-11-27 DIAGNOSIS — C773 Secondary and unspecified malignant neoplasm of axilla and upper limb lymph nodes: Secondary | ICD-10-CM | POA: Diagnosis not present

## 2016-11-27 DIAGNOSIS — M179 Osteoarthritis of knee, unspecified: Secondary | ICD-10-CM | POA: Diagnosis not present

## 2016-11-27 DIAGNOSIS — C50412 Malignant neoplasm of upper-outer quadrant of left female breast: Secondary | ICD-10-CM | POA: Diagnosis not present

## 2016-11-27 DIAGNOSIS — Z9221 Personal history of antineoplastic chemotherapy: Secondary | ICD-10-CM | POA: Diagnosis not present

## 2016-11-27 DIAGNOSIS — Z510 Encounter for antineoplastic radiation therapy: Secondary | ICD-10-CM | POA: Diagnosis not present

## 2016-11-27 DIAGNOSIS — F419 Anxiety disorder, unspecified: Secondary | ICD-10-CM | POA: Diagnosis not present

## 2016-11-27 MED FILL — clonazePAM 1 MG TABS: 1 | 90 days supply | Qty: 135 | Fill #1

## 2016-11-28 ENCOUNTER — Ambulatory Visit: Payer: 59 | Admitting: Radiation Oncology

## 2016-11-28 ENCOUNTER — Ambulatory Visit
Admission: RE | Admit: 2016-11-28 | Discharge: 2016-11-28 | Disposition: A | Discharge status: Home / Self Care | Payer: 59 | Source: Ambulatory Visit | Attending: Radiation Oncology | Admitting: Radiation Oncology

## 2016-11-28 DIAGNOSIS — Z79899 Other long term (current) drug therapy: Secondary | ICD-10-CM | POA: Diagnosis not present

## 2016-11-28 DIAGNOSIS — C50412 Malignant neoplasm of upper-outer quadrant of left female breast: Secondary | ICD-10-CM | POA: Diagnosis not present

## 2016-11-28 DIAGNOSIS — F329 Major depressive disorder, single episode, unspecified: Secondary | ICD-10-CM | POA: Diagnosis not present

## 2016-11-28 DIAGNOSIS — Z171 Estrogen receptor negative status [ER-]: Secondary | ICD-10-CM | POA: Diagnosis not present

## 2016-11-28 DIAGNOSIS — F419 Anxiety disorder, unspecified: Secondary | ICD-10-CM | POA: Diagnosis not present

## 2016-11-28 DIAGNOSIS — Z9221 Personal history of antineoplastic chemotherapy: Secondary | ICD-10-CM | POA: Diagnosis not present

## 2016-11-28 DIAGNOSIS — M179 Osteoarthritis of knee, unspecified: Secondary | ICD-10-CM | POA: Diagnosis not present

## 2016-11-28 DIAGNOSIS — C773 Secondary and unspecified malignant neoplasm of axilla and upper limb lymph nodes: Secondary | ICD-10-CM | POA: Diagnosis not present

## 2016-11-28 DIAGNOSIS — Z510 Encounter for antineoplastic radiation therapy: Secondary | ICD-10-CM | POA: Diagnosis not present

## 2016-11-29 ENCOUNTER — Ambulatory Visit
Admission: RE | Admit: 2016-11-29 | Discharge: 2016-11-29 | Disposition: A | Discharge status: Home / Self Care | Payer: 59 | Source: Ambulatory Visit | Attending: Radiation Oncology | Admitting: Radiation Oncology

## 2016-11-29 DIAGNOSIS — Z171 Estrogen receptor negative status [ER-]: Secondary | ICD-10-CM | POA: Diagnosis not present

## 2016-11-29 DIAGNOSIS — M179 Osteoarthritis of knee, unspecified: Secondary | ICD-10-CM | POA: Diagnosis not present

## 2016-11-29 DIAGNOSIS — Z9221 Personal history of antineoplastic chemotherapy: Secondary | ICD-10-CM | POA: Diagnosis not present

## 2016-11-29 DIAGNOSIS — C773 Secondary and unspecified malignant neoplasm of axilla and upper limb lymph nodes: Secondary | ICD-10-CM | POA: Diagnosis not present

## 2016-11-29 DIAGNOSIS — Z79899 Other long term (current) drug therapy: Secondary | ICD-10-CM | POA: Diagnosis not present

## 2016-11-29 DIAGNOSIS — F329 Major depressive disorder, single episode, unspecified: Secondary | ICD-10-CM | POA: Diagnosis not present

## 2016-11-29 DIAGNOSIS — C50412 Malignant neoplasm of upper-outer quadrant of left female breast: Secondary | ICD-10-CM | POA: Diagnosis not present

## 2016-11-29 DIAGNOSIS — F419 Anxiety disorder, unspecified: Secondary | ICD-10-CM | POA: Diagnosis not present

## 2016-11-29 DIAGNOSIS — Z510 Encounter for antineoplastic radiation therapy: Secondary | ICD-10-CM | POA: Diagnosis not present

## 2016-11-30 ENCOUNTER — Ambulatory Visit
Admission: RE | Admit: 2016-11-30 | Discharge: 2016-11-30 | Disposition: A | Discharge status: Home / Self Care | Payer: 59 | Source: Ambulatory Visit | Attending: Radiation Oncology | Admitting: Radiation Oncology

## 2016-11-30 DIAGNOSIS — Z79899 Other long term (current) drug therapy: Secondary | ICD-10-CM | POA: Diagnosis not present

## 2016-11-30 DIAGNOSIS — C773 Secondary and unspecified malignant neoplasm of axilla and upper limb lymph nodes: Secondary | ICD-10-CM | POA: Diagnosis not present

## 2016-11-30 DIAGNOSIS — M179 Osteoarthritis of knee, unspecified: Secondary | ICD-10-CM | POA: Diagnosis not present

## 2016-11-30 DIAGNOSIS — F419 Anxiety disorder, unspecified: Secondary | ICD-10-CM | POA: Diagnosis not present

## 2016-11-30 DIAGNOSIS — C50412 Malignant neoplasm of upper-outer quadrant of left female breast: Secondary | ICD-10-CM | POA: Diagnosis not present

## 2016-11-30 DIAGNOSIS — Z510 Encounter for antineoplastic radiation therapy: Secondary | ICD-10-CM | POA: Diagnosis not present

## 2016-11-30 DIAGNOSIS — Z171 Estrogen receptor negative status [ER-]: Secondary | ICD-10-CM | POA: Diagnosis not present

## 2016-11-30 DIAGNOSIS — F329 Major depressive disorder, single episode, unspecified: Secondary | ICD-10-CM | POA: Diagnosis not present

## 2016-11-30 DIAGNOSIS — Z9221 Personal history of antineoplastic chemotherapy: Secondary | ICD-10-CM | POA: Diagnosis not present

## 2016-11-30 NOTE — Progress Notes (Signed)
Department of Radiation Oncology  Phone:  [phone] Fax:        [phone]  Weekly Treatment Note    Name: Alexis Fox Date: 11/30/2016 MRN: [id number] DOB: 11-13-1956   Diagnosis:     ICD-9-CM ICD-10-CM   1. Breast cancer of upper-outer quadrant of left female breast ([place]) 174.4 C50.412      Current dose: 48.6 Gy  Current fraction: 27   MEDICATIONS: Current Outpatient Prescriptions  Medication Sig Dispense Refill  . buPROPion (WELLBUTRIN XL) 150 MG 24 hr tablet Take 150 mg by mouth daily.  4  . chlorpheniramine (CHLOR-TRIMETON) 4 MG tablet Take 4 mg by mouth every 4 (four) hours as needed for allergies.    . chlorpheniramine-HYDROcodone (TUSSIONEX) 10-8 MG/5ML SUER Take 5 mLs by mouth every 12 (twelve) hours as needed for cough (took 1/2 dose this am).    . citalopram (CELEXA) 40 MG tablet   3  . clonazePAM (KLONOPIN) 1 MG tablet Take 1.5 mg by mouth at bedtime.     . famotidine (PEPCID) 20 MG tablet Take 20 mg by mouth 2 (two) times daily.    Marland Kitchen loratadine (CLARITIN) 10 MG tablet Take 10 mg by mouth daily.    . non-metallic deodorant Jethro Poling) MISC Apply 1 application topically daily as needed.    . nystatin-triamcinolone ointment (MYCOLOG) Apply 1 application topically 2 (two) times daily. 60 g 3  . oxyCODONE (OXY IR/ROXICODONE) 5 MG immediate release tablet Take 1-2 tablets (5-10 mg total) by mouth every 6 (six) hours as needed for moderate pain, severe pain or breakthrough pain. (Patient not taking: Reported on 11/23/2016) 20 tablet 0  . pramipexole (MIRAPEX) 0.5 MG tablet Take 0.5 mg by mouth at bedtime.    Marland Kitchen PREVIDENT 5000 DRY MOUTH 1.1 % GEL dental gel   99  . RESTASIS 0.05 % ophthalmic emulsion Place 1 drop into both eyes 2 (two) times daily as needed. For dry eyes.  4  . Wound Cleansers (RADIAPLEX EX) Apply topically.     No current facility-administered medications for this encounter.    Facility-Administered Medications Ordered in Other Encounters    Medication Dose Route Frequency Provider Last Rate Last Dose  . sodium chloride flush (NS) 0.9 % injection 10 mL  10 mL Intracatheter PRN Nicholas Lose, MD   10 mL at 07/19/16 1434     ALLERGIES: Decadron [dexamethasone] and Turmeric   LABORATORY DATA:  Lab Results  Component Value Date   WBC 3.0 (L) 08/16/2016   HGB 11.1 (L) 08/16/2016   HCT 34.0 (L) 08/16/2016   MCV 97.6 08/16/2016   PLT 289 08/16/2016   Lab Results  Component Value Date   NA 140 10/01/2016   K 4.3 10/01/2016   CL 108 04/07/2016   CO2 25 10/01/2016   Lab Results  Component Value Date   ALT 19 10/01/2016   AST 18 10/01/2016   ALKPHOS 84 10/01/2016   BILITOT 0.25 10/01/2016     NARRATIVE: Alexis Fox was seen today for weekly treatment management. The chart was checked and the patient's films were reviewed.  The patient states that she is doing well this week. Some increased irritation of the scan in the treatment area. No major changes.  PHYSICAL EXAMINATION: vitals were not taken for this visit.     Dry desquamation is present in the inframammary region.  ASSESSMENT: The patient is doing satisfactorily with treatment.  PLAN: We will continue with the patient's radiation treatment as planned.  The  patient will begin using Neosporin in the region. Otherwise doing quite well.

## 2016-12-03 HISTORY — PX: BREAST LUMPECTOMY: SHX2

## 2016-12-04 ENCOUNTER — Ambulatory Visit
Admission: RE | Admit: 2016-12-04 | Discharge: 2016-12-04 | Disposition: A | Discharge status: Home / Self Care | Payer: 59 | Source: Ambulatory Visit | Attending: Radiation Oncology | Admitting: Radiation Oncology

## 2016-12-04 DIAGNOSIS — M179 Osteoarthritis of knee, unspecified: Secondary | ICD-10-CM | POA: Diagnosis not present

## 2016-12-04 DIAGNOSIS — F419 Anxiety disorder, unspecified: Secondary | ICD-10-CM | POA: Diagnosis not present

## 2016-12-04 DIAGNOSIS — Z171 Estrogen receptor negative status [ER-]: Secondary | ICD-10-CM | POA: Diagnosis not present

## 2016-12-04 DIAGNOSIS — Z79899 Other long term (current) drug therapy: Secondary | ICD-10-CM | POA: Diagnosis not present

## 2016-12-04 DIAGNOSIS — F329 Major depressive disorder, single episode, unspecified: Secondary | ICD-10-CM | POA: Diagnosis not present

## 2016-12-04 DIAGNOSIS — Z510 Encounter for antineoplastic radiation therapy: Secondary | ICD-10-CM | POA: Diagnosis not present

## 2016-12-04 DIAGNOSIS — C50412 Malignant neoplasm of upper-outer quadrant of left female breast: Secondary | ICD-10-CM | POA: Diagnosis not present

## 2016-12-04 DIAGNOSIS — C773 Secondary and unspecified malignant neoplasm of axilla and upper limb lymph nodes: Secondary | ICD-10-CM | POA: Diagnosis not present

## 2016-12-04 DIAGNOSIS — Z9221 Personal history of antineoplastic chemotherapy: Secondary | ICD-10-CM | POA: Diagnosis not present

## 2016-12-05 ENCOUNTER — Ambulatory Visit
Admission: RE | Admit: 2016-12-05 | Discharge: 2016-12-05 | Disposition: A | Discharge status: Home / Self Care | Payer: 59 | Source: Ambulatory Visit | Attending: Radiation Oncology | Admitting: Radiation Oncology

## 2016-12-05 DIAGNOSIS — C773 Secondary and unspecified malignant neoplasm of axilla and upper limb lymph nodes: Secondary | ICD-10-CM | POA: Diagnosis not present

## 2016-12-05 DIAGNOSIS — Z9221 Personal history of antineoplastic chemotherapy: Secondary | ICD-10-CM | POA: Diagnosis not present

## 2016-12-05 DIAGNOSIS — M179 Osteoarthritis of knee, unspecified: Secondary | ICD-10-CM | POA: Diagnosis not present

## 2016-12-05 DIAGNOSIS — Z510 Encounter for antineoplastic radiation therapy: Secondary | ICD-10-CM | POA: Diagnosis not present

## 2016-12-05 DIAGNOSIS — F419 Anxiety disorder, unspecified: Secondary | ICD-10-CM | POA: Diagnosis not present

## 2016-12-05 DIAGNOSIS — Z79899 Other long term (current) drug therapy: Secondary | ICD-10-CM | POA: Diagnosis not present

## 2016-12-05 DIAGNOSIS — Z171 Estrogen receptor negative status [ER-]: Secondary | ICD-10-CM | POA: Diagnosis not present

## 2016-12-05 DIAGNOSIS — C50412 Malignant neoplasm of upper-outer quadrant of left female breast: Secondary | ICD-10-CM | POA: Diagnosis not present

## 2016-12-05 DIAGNOSIS — F329 Major depressive disorder, single episode, unspecified: Secondary | ICD-10-CM | POA: Diagnosis not present

## 2016-12-06 ENCOUNTER — Ambulatory Visit
Admission: RE | Admit: 2016-12-06 | Discharge: 2016-12-06 | Disposition: A | Discharge status: Home / Self Care | Payer: 59 | Source: Ambulatory Visit | Attending: Radiation Oncology | Admitting: Radiation Oncology

## 2016-12-06 DIAGNOSIS — Z79899 Other long term (current) drug therapy: Secondary | ICD-10-CM | POA: Diagnosis not present

## 2016-12-06 DIAGNOSIS — Z510 Encounter for antineoplastic radiation therapy: Secondary | ICD-10-CM | POA: Diagnosis not present

## 2016-12-06 DIAGNOSIS — C50412 Malignant neoplasm of upper-outer quadrant of left female breast: Secondary | ICD-10-CM | POA: Diagnosis not present

## 2016-12-06 DIAGNOSIS — F419 Anxiety disorder, unspecified: Secondary | ICD-10-CM | POA: Diagnosis not present

## 2016-12-06 DIAGNOSIS — C773 Secondary and unspecified malignant neoplasm of axilla and upper limb lymph nodes: Secondary | ICD-10-CM | POA: Diagnosis not present

## 2016-12-06 DIAGNOSIS — M179 Osteoarthritis of knee, unspecified: Secondary | ICD-10-CM | POA: Diagnosis not present

## 2016-12-06 DIAGNOSIS — Z171 Estrogen receptor negative status [ER-]: Secondary | ICD-10-CM | POA: Diagnosis not present

## 2016-12-06 DIAGNOSIS — Z9221 Personal history of antineoplastic chemotherapy: Secondary | ICD-10-CM | POA: Diagnosis not present

## 2016-12-06 DIAGNOSIS — F329 Major depressive disorder, single episode, unspecified: Secondary | ICD-10-CM | POA: Diagnosis not present

## 2016-12-07 ENCOUNTER — Ambulatory Visit
Admission: RE | Admit: 2016-12-07 | Discharge: 2016-12-07 | Disposition: A | Discharge status: Home / Self Care | Payer: 59 | Source: Ambulatory Visit | Attending: Radiation Oncology | Admitting: Radiation Oncology

## 2016-12-07 VITALS — BP 116/62 | HR 79 | Resp 18 | Wt 205.0 lb

## 2016-12-07 DIAGNOSIS — C50412 Malignant neoplasm of upper-outer quadrant of left female breast: Secondary | ICD-10-CM | POA: Insufficient documentation

## 2016-12-07 DIAGNOSIS — Z9221 Personal history of antineoplastic chemotherapy: Secondary | ICD-10-CM | POA: Diagnosis not present

## 2016-12-07 DIAGNOSIS — F419 Anxiety disorder, unspecified: Secondary | ICD-10-CM | POA: Diagnosis not present

## 2016-12-07 DIAGNOSIS — Z171 Estrogen receptor negative status [ER-]: Secondary | ICD-10-CM | POA: Diagnosis not present

## 2016-12-07 DIAGNOSIS — Z510 Encounter for antineoplastic radiation therapy: Secondary | ICD-10-CM | POA: Diagnosis not present

## 2016-12-07 DIAGNOSIS — M179 Osteoarthritis of knee, unspecified: Secondary | ICD-10-CM | POA: Diagnosis not present

## 2016-12-07 DIAGNOSIS — Z79899 Other long term (current) drug therapy: Secondary | ICD-10-CM | POA: Diagnosis not present

## 2016-12-07 DIAGNOSIS — F329 Major depressive disorder, single episode, unspecified: Secondary | ICD-10-CM | POA: Diagnosis not present

## 2016-12-07 DIAGNOSIS — C773 Secondary and unspecified malignant neoplasm of axilla and upper limb lymph nodes: Secondary | ICD-10-CM | POA: Diagnosis not present

## 2016-12-07 MED ORDER — RADIAPLEXRX EX GEL
Freq: Once | CUTANEOUS | Status: AC
  Administered 2016-12-07: 12:00:00 via TOPICAL

## 2016-12-07 NOTE — Progress Notes (Signed)
Weight and vitals stable. Denies pain. Completes treatment on Tuesday. Hyperpigmentation within treatment field noted. Previous areas of desquamation have healed. Patient continues to use radiaplex as directed. Provided patient with second tube of radiaplex. Patient understands to continue use of Radiaplex until follow up. Provided patient with one month follow up appointment card. Patient understands to contact this RN with future needs. Provided patient with and reviewed FYNN, ABC, and [facility] flyers.   BP 116/62 (BP Location: Right Arm, Patient Position: Sitting, Cuff Size: Large)   Pulse 79   Resp 18   Wt 205 lb (93 kg)   SpO2 100%   BMI 35.19 kg/m  Wt Readings from Last 3 Encounters:  12/07/16 205 lb (93 kg)  11/23/16 203 lb 6.4 oz (92.3 kg)  11/15/16 201 lb 4 oz (91.3 kg)

## 2016-12-07 NOTE — Progress Notes (Signed)
  Radiation Oncology         ([phone] ________________________________  Name: Alexis Fox MRN: [id number]  Date: 12/07/2016  DOB: 1956/03/25  Weekly Radiation Therapy Management    ICD-9-CM ICD-10-CM   1. Breast cancer of upper-outer quadrant of left female breast ([facility]) 174.4 C50.412 hyaluronate sodium (RADIAPLEXRX) gel    Current Dose: 56.4 Gy     Planned Dose:  60.4 Gy  Narrative . . . . . . . . The patient presents for the final under treatment assessment.                                             Weight and vitals stable. Denies pain. The patient completes treatment on Tuesday 12/11/16. Per nursing, hyperpigmentation within treatment field noted, and previous areas of desquamation have healed. Patient continues to use radiaplex as directed.                                  Set-up films were reviewed.                                 The chart was checked. Physical Findings. . . Blood pressure 116/62, pulse 79, resp. rate 18, weight 205 lb (93 kg), SpO2 100 %. Weight essentially stable.  No significant changes. Impression . . . . . . . The patient tolerated radiation relatively well. Plan . . . . . . . . . . . . Complete radiation today as scheduled, and follow-up in one month. The patient was encouraged to call or return to the clinic in the interim for any worsening symptoms.  ________________________________  Sheral Apley Tammi Klippel, M.D.  This document serves as a record of services personally performed by Tyler Pita, MD. It was created on his behalf by Maryla Morrow, a trained medical scribe. The creation of this record is based on the scribe's personal observations and the provider's statements to them. This document has been checked and approved by the attending provider.

## 2016-12-10 ENCOUNTER — Ambulatory Visit
Admission: RE | Admit: 2016-12-10 | Discharge: 2016-12-10 | Disposition: A | Discharge status: Home / Self Care | Payer: 59 | Source: Ambulatory Visit | Attending: Radiation Oncology | Admitting: Radiation Oncology

## 2016-12-10 ENCOUNTER — Encounter: Payer: Self-pay | Admitting: Hematology and Oncology

## 2016-12-10 DIAGNOSIS — F419 Anxiety disorder, unspecified: Secondary | ICD-10-CM | POA: Diagnosis not present

## 2016-12-10 DIAGNOSIS — C50412 Malignant neoplasm of upper-outer quadrant of left female breast: Secondary | ICD-10-CM | POA: Diagnosis not present

## 2016-12-10 DIAGNOSIS — M179 Osteoarthritis of knee, unspecified: Secondary | ICD-10-CM | POA: Diagnosis not present

## 2016-12-10 DIAGNOSIS — Z171 Estrogen receptor negative status [ER-]: Secondary | ICD-10-CM | POA: Diagnosis not present

## 2016-12-10 DIAGNOSIS — Z9221 Personal history of antineoplastic chemotherapy: Secondary | ICD-10-CM | POA: Diagnosis not present

## 2016-12-10 DIAGNOSIS — Z79899 Other long term (current) drug therapy: Secondary | ICD-10-CM | POA: Diagnosis not present

## 2016-12-10 DIAGNOSIS — F329 Major depressive disorder, single episode, unspecified: Secondary | ICD-10-CM | POA: Diagnosis not present

## 2016-12-10 DIAGNOSIS — C773 Secondary and unspecified malignant neoplasm of axilla and upper limb lymph nodes: Secondary | ICD-10-CM | POA: Diagnosis not present

## 2016-12-10 DIAGNOSIS — Z510 Encounter for antineoplastic radiation therapy: Secondary | ICD-10-CM | POA: Diagnosis not present

## 2016-12-11 ENCOUNTER — Encounter: Payer: Self-pay | Admitting: *Deleted

## 2016-12-11 ENCOUNTER — Ambulatory Visit
Admission: RE | Admit: 2016-12-11 | Discharge: 2016-12-11 | Disposition: A | Discharge status: Home / Self Care | Payer: 59 | Source: Ambulatory Visit | Attending: Radiation Oncology | Admitting: Radiation Oncology

## 2016-12-11 ENCOUNTER — Encounter: Payer: Self-pay | Admitting: Radiation Oncology

## 2016-12-11 DIAGNOSIS — C50412 Malignant neoplasm of upper-outer quadrant of left female breast: Secondary | ICD-10-CM | POA: Diagnosis not present

## 2016-12-11 DIAGNOSIS — M179 Osteoarthritis of knee, unspecified: Secondary | ICD-10-CM | POA: Diagnosis not present

## 2016-12-11 DIAGNOSIS — F329 Major depressive disorder, single episode, unspecified: Secondary | ICD-10-CM | POA: Diagnosis not present

## 2016-12-11 DIAGNOSIS — C773 Secondary and unspecified malignant neoplasm of axilla and upper limb lymph nodes: Secondary | ICD-10-CM | POA: Diagnosis not present

## 2016-12-11 DIAGNOSIS — Z510 Encounter for antineoplastic radiation therapy: Secondary | ICD-10-CM | POA: Diagnosis not present

## 2016-12-11 DIAGNOSIS — Z171 Estrogen receptor negative status [ER-]: Secondary | ICD-10-CM | POA: Diagnosis not present

## 2016-12-11 DIAGNOSIS — F419 Anxiety disorder, unspecified: Secondary | ICD-10-CM | POA: Diagnosis not present

## 2016-12-11 DIAGNOSIS — Z9221 Personal history of antineoplastic chemotherapy: Secondary | ICD-10-CM | POA: Diagnosis not present

## 2016-12-11 DIAGNOSIS — Z79899 Other long term (current) drug therapy: Secondary | ICD-10-CM | POA: Diagnosis not present

## 2016-12-14 DIAGNOSIS — R05 Cough: Secondary | ICD-10-CM | POA: Diagnosis not present

## 2016-12-14 MED FILL — predniSONE 20 MG TABS: 20 | 7 days supply | Qty: 14 | Fill #0

## 2016-12-14 MED FILL — IPRATROPIUM 0.06% SPRAY: 0.06 | 12 days supply | Qty: 15 | Fill #0

## 2016-12-14 NOTE — Progress Notes (Signed)
  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      Indication for treatment:  Curative       Radiation treatment dates:   10/22/2016 to 12/11/2016  Site/dose:    1. The Left breast [4 field - Breath hold] was treated to 50.4 Gy in 28 fractions at 1.8 Gy per fraction. 2. The Left breast was boosted to 10 Gy in 5 fractions at 2 Gy per fraction.   Beams/energy:    1. 3D // 10X, 6X 2. En face // 12 MeV  Narrative: The patient tolerated radiation treatment relatively well.  The patient developed hyperpigmentation within the treatment field and previous areas of desquamation have healed.   Plan: The patient has completed radiation treatment. The patient will return to radiation oncology clinic for routine followup in one month. I advised her to call or return sooner if she has any questions or concerns related to her recovery or treatment. ________________________________  Sheral Apley. Tammi Klippel, M.D.  This document serves as a record of services personally performed by Tyler Pita, MD. It was created on his behalf by Arlyce Harman, a trained medical scribe. The creation of this record is based on the scribe's personal observations and the provider's statements to them. This document has been checked and approved by the attending provider.

## 2016-12-17 MED FILL — CITALOPRAM HBR 40 MG TABLET: 40 | 90 days supply | Qty: 90 | Fill #0

## 2016-12-18 NOTE — Assessment & Plan Note (Signed)
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  PREVENT trial: [facility] [id number]: Patient is enrolled in this trial atorvastatin versus placebo No side effects related to the study drug.  Treatment summary:  1. Completed 4 cycles of dose dense Adriamycin and Cytoxan; and a 12 cycles ofAbraxanefrom 04/05/2016 to 08/16/2016 2. left lumpectomy: IDC 0.5 cm, DCIS 1 cm, margins negative, 1/4 lymph nodes positive, T1aN1 stage II a pathologic stage ------------------------------------------------------------------------------------------------------------------------------------ Current treatment: After lengthy discussion with Dr. Donne Hazel, the treatment plan is to not to enroll her in the alliance clinical trial but to treat her with adjuvant radiation therapy. Patient is not eligible to participate in the adjuvant Pembrolizumab clinical trial because she did not undergo axillary lymph node dissection.  Further adjuvant therapy: I discussed the role of Xeloda for 6 months.

## 2016-12-19 ENCOUNTER — Ambulatory Visit (HOSPITAL_BASED_OUTPATIENT_CLINIC_OR_DEPARTMENT_OTHER): Payer: 59 | Admitting: Hematology and Oncology

## 2016-12-19 ENCOUNTER — Encounter: Payer: Self-pay | Admitting: Hematology and Oncology

## 2016-12-19 ENCOUNTER — Encounter: Payer: Self-pay | Admitting: *Deleted

## 2016-12-19 DIAGNOSIS — C773 Secondary and unspecified malignant neoplasm of axilla and upper limb lymph nodes: Secondary | ICD-10-CM

## 2016-12-19 DIAGNOSIS — Z171 Estrogen receptor negative status [ER-]: Secondary | ICD-10-CM

## 2016-12-19 DIAGNOSIS — C50412 Malignant neoplasm of upper-outer quadrant of left female breast: Secondary | ICD-10-CM

## 2016-12-19 MED ORDER — CAPECITABINE 500 MG PO TABS: 750.0000 mg/m2 | ORAL_TABLET | Freq: Two times a day (BID) | ORAL | 7 refills | Status: DC

## 2016-12-19 MED FILL — XELODA 500 MG TABLET: 500 | 14 days supply | Qty: 84 | Fill #0

## 2016-12-19 NOTE — Progress Notes (Signed)
Patient Care Team: [name], MD as PCP - General (Internal Medicine) [name], RN as [place] [facility]  DIAGNOSIS:  Encounter Diagnosis  Name Primary?  . Breast cancer of upper-outer quadrant of left female breast ([place])     SUMMARY OF ONCOLOGIC HISTORY:   Breast cancer of upper-outer quadrant of left female breast ([place])   03/02/2016 Mammogram    Hypoechoic masses in the upper outer quadrant left breast 2 adjacent irregular masses 2.5-1.4 x 1.2 cm and the other 2.7 x 1.4 x 2.3 cm total span 6.4 cm, enlarged multiple axillary lymph nodes measuring 1.7 cm, T2 N1/N2 stage IIb vs IIIa      03/19/2016 Initial Diagnosis    Left breast biopsy 12:30 position: Invasive ductal carcinoma with DCIS, 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      10/22/2016 - 12/11/2016 Radiation Therapy    Adjuvant radiation therapy       CHIEF COMPLIANT: follow-up after adjuvant radiation  INTERVAL HISTORY: Alexis Fox is a 61 year old with above-mentioned history of left breast cancer who underwent adjuvant radiation therapy and is here to discuss the next treatment plan. She had a lot of radiation dermatitis and has recovered very well from this. She feels that the her skin is healing very well. She tried to take turmeric and could not tolerate it.  REVIEW OF SYSTEMS:   Constitutional: Denies fevers, chills or abnormal weight loss Eyes: Denies blurriness of vision Ears, nose, mouth, throat, and face: Denies  mucositis or sore throat Respiratory: Denies cough, dyspnea or wheezes Cardiovascular: Denies palpitation, chest discomfort Gastrointestinal:  Denies nausea, heartburn or change in bowel habits Skin: Denies abnormal skin rashes Lymphatics: Denies new lymphadenopathy or easy bruising Neurological:Denies numbness, tingling or new weaknesses Behavioral/Psych: Mood is stable, no new changes  Extremities: No lower extremity edema Breast:  Radiation dermatitis All other systems were reviewed with the patient and are negative.  I have reviewed the past medical history, past surgical history, social history and family history with the patient and they are unchanged from previous note.  ALLERGIES:  is allergic to decadron [dexamethasone] and turmeric.  MEDICATIONS:  Current Outpatient Prescriptions  Medication Sig Dispense Refill  . buPROPion (WELLBUTRIN XL) 150 MG 24 hr tablet Take 150 mg by mouth daily.  4  . capecitabine (XELODA) 500 MG tablet Take 3 tablets (1,500 mg total) by mouth 2 (two) times daily after a meal. 84 tablet 7  . chlorpheniramine (CHLOR-TRIMETON) 4 MG tablet Take 4 mg by mouth every 4 (four) hours as needed for allergies.    . chlorpheniramine-HYDROcodone (TUSSIONEX) 10-8 MG/5ML SUER Take 5 mLs by mouth every 12 (twelve) hours as needed for cough (took 1/2 dose this am).    . citalopram (CELEXA) 40 MG tablet   3  . clonazePAM (KLONOPIN) 1 MG tablet Take 1.5 mg by mouth at bedtime.     . famotidine (PEPCID) 20 MG tablet Take 20 mg by mouth 2 (two) times  daily.    . loratadine (CLARITIN) 10 MG tablet Take 10 mg by mouth daily.    . non-metallic deodorant Jethro Poling) MISC Apply 1 application topically daily as needed.    . nystatin-triamcinolone ointment (MYCOLOG) Apply 1 application topically 2 (two) times daily. 60 g 3  . oxyCODONE (OXY IR/ROXICODONE) 5 MG immediate release tablet Take 1-2 tablets (5-10 mg total) by mouth every 6 (six) hours as needed for moderate pain, severe  pain or breakthrough pain. (Patient not taking: Reported on 12/07/2016) 20 tablet 0  . pramipexole (MIRAPEX) 0.5 MG tablet Take 0.5 mg by mouth at bedtime.    Marland Kitchen PREVIDENT 5000 DRY MOUTH 1.1 % GEL dental gel   99  . RESTASIS 0.05 % ophthalmic emulsion Place 1 drop into both eyes 2 (two) times daily as needed. For dry eyes.  4  . Wound Cleansers (RADIAPLEX EX) Apply topically.     No current facility-administered medications for this visit.    Facility-Administered Medications Ordered in Other Visits  Medication Dose Route Frequency Provider Last Rate Last Dose  . sodium chloride flush (NS) 0.9 % injection 10 mL  10 mL Intracatheter PRN Nicholas Lose, MD   10 mL at 07/19/16 1434    PHYSICAL EXAMINATION: ECOG PERFORMANCE STATUS: 1 - Symptomatic but completely ambulatory  Vitals:   12/19/16 1133  BP: 113/78  Pulse: 75  Resp: 18  Temp: 97.6 F (36.4 C)   Filed Weights   12/19/16 1133  Weight: 210 lb 11.2 oz (95.6 kg)    GENERAL:alert, no distress and comfortable SKIN: skin color, texture, turgor are normal, no rashes or significant lesions EYES: normal, Conjunctiva are pink and non-injected, sclera clear OROPHARYNX:no exudate, no erythema and lips, buccal mucosa, and tongue normal  NECK: supple, thyroid normal size, non-tender, without nodularity LYMPH:  no palpable lymphadenopathy in the cervical, axillary or inguinal LUNGS: clear to auscultation and percussion with normal breathing effort HEART: regular rate & rhythm and no murmurs and no lower extremity edema ABDOMEN:abdomen soft, non-tender and normal bowel sounds MUSCULOSKELETAL:no cyanosis of digits and no clubbing  NEURO: alert & oriented x 3 with fluent speech, no focal motor/sensory deficits EXTREMITIES: No lower extremity edema  LABORATORY DATA:  I have reviewed the data as listed   Chemistry      Component Value Date/Time   NA 140 10/01/2016 1236   K 4.3 10/01/2016 1236   CL 108 04/07/2016 0345   CO2 25 10/01/2016  1236   BUN 17.6 10/01/2016 1236   CREATININE 0.80 11/08/2016 1241   CREATININE 0.8 10/01/2016 1236      Component Value Date/Time   CALCIUM 9.4 10/01/2016 1236   ALKPHOS 84 10/01/2016 1236   AST 18 10/01/2016 1236   ALT 19 10/01/2016 1236   BILITOT 0.25 10/01/2016 1236       Lab Results  Component Value Date   WBC 3.0 (L) 08/16/2016   HGB 11.1 (L) 08/16/2016   HCT 34.0 (L) 08/16/2016   MCV 97.6 08/16/2016   PLT 289 08/16/2016   NEUTROABS 1.7 08/16/2016    ASSESSMENT & PLAN:  Breast cancer of upper-outer quadrant of left female breast ([place]) Mammogram 03/02/2016: Hypoechoic masses in the upper outer quadrant left breast 2 adjacent irregular masses 2.5-1.4 x 1.2 cm and the other 2.7 x 1.4 x 2.3 cm total span 6.4 cm, enlarged multiple axillary lymph nodes measuring 1.7 cm, T2 N1/N2 stage IIb vs IIIa Left breast biopsy 03/19/2016 12:30 position: Invasive ductal carcinoma with DCIS, 1/1 lymph  node positive, grade 3, ER 0%, PR 0%, HER-2 negative ratio 1.68, Ki-67 90% MRI breast 08/17/2016: Significant reduction in the size of the tumor from 6.3 cm to 1.9 cm with low-grade enhancement, resolution of axillary lymphadenopathy.  PREVENT trial: [facility] [id number]: Patient is enrolled in this trial atorvastatin versus placebo No side effects related to the study drug.  Treatment summary:  1. Completed 4 cycles of dose dense Adriamycin and Cytoxan; and a 12 cycles ofAbraxanefrom 04/05/2016 to 08/16/2016 2. left lumpectomy 09/12/2016: IDC 0.5 cm, DCIS 1 cm, margins negative, 1/4 lymph nodes positive, T1aN1 stage II a pathologic stage 3. Adjuvant radiation 10/22/2016 to 12/11/2016 ------------------------------------------------------------------------------------------------------------------------------------ Current treatment: After lengthy discussion with Dr. Donne Hazel, the treatment plan is to not to enroll her in the alliance clinical trial but to treat her with adjuvant radiation  therapy. Patient is not eligible to participate in the adjuvant Pembrolizumab clinical trial because she did not undergo axillary lymph node dissection.  Adjuvant therapy plan: I discussed the role of Xeloda for 6 months. She will start 01/07/2017 1500 mg twice a day 2 weeks on one week off CREATE-X study enrolled 910 patient's with triple negative breast cancer and residual disease after neo-adjuvant therapy. 455 patients assigned to 1250 mg/m times daily 2 weeks on 1 week off  up to 8 cycles, at 5 years PFS 74.1% with Xeloda compared to 67.7% in control arm, 30% reduction in risk, overall survival 89.2% versus 83.9%, hand-foot syndrome was seen in 72.3% with Xeloda grade 3 in 10.9%  Return to clinic 01/28/2017 for cycle 2 with lab check I spent 25 minutes talking to the patient of which more than half was spent in counseling and coordination of care.  No orders of the defined types were placed in this encounter.  The patient has a good understanding of the overall plan. she agrees with it. she will call with any problems that may develop before the next visit here.   Rulon Eisenmenger, MD 12/19/16

## 2016-12-19 NOTE — Progress Notes (Signed)
12/19/16 at 11:38am - 6 month cardiac MRI - The research nurse met with the pt and her husband this morning during her routine follow up appointment with Dr. Lindi Adie.  The research nurse informed the pt and her husband that her 6 month cardiac MRI which was done on 10/01/16 was "negative for alert findings".  The pt was relieved to get the good news.  The pt was informed that the cardiac MRI is not a "full clinical exam and evaluates part of the heart".  The pt was also told that a "negative study indicates that no immediately life threatening condition related to cardiac contractility was identified".  Dr. Lindi Adie was made aware of the results on 12/11/16.  The pt was in agreement to return her month 10 medication diaries at her next office visit on 02/05/17.  The pt was told that her month 12 visit will be due the beginning of May 2018.  The pt and her husband verbalized understanding of her upcoming appointments.  The pt was thanked for her support of the trial. Brion Aliment RN, BSN, CCRP  Clinical Research Nurse 12/19/2016 11:46 AM

## 2016-12-31 DIAGNOSIS — H52223 Regular astigmatism, bilateral: Secondary | ICD-10-CM | POA: Diagnosis not present

## 2016-12-31 DIAGNOSIS — H11423 Conjunctival edema, bilateral: Secondary | ICD-10-CM | POA: Diagnosis not present

## 2016-12-31 DIAGNOSIS — H43811 Vitreous degeneration, right eye: Secondary | ICD-10-CM | POA: Diagnosis not present

## 2016-12-31 DIAGNOSIS — H25013 Cortical age-related cataract, bilateral: Secondary | ICD-10-CM | POA: Diagnosis not present

## 2016-12-31 DIAGNOSIS — H5203 Hypermetropia, bilateral: Secondary | ICD-10-CM | POA: Diagnosis not present

## 2016-12-31 DIAGNOSIS — H524 Presbyopia: Secondary | ICD-10-CM | POA: Diagnosis not present

## 2016-12-31 DIAGNOSIS — H11153 Pinguecula, bilateral: Secondary | ICD-10-CM | POA: Diagnosis not present

## 2016-12-31 DIAGNOSIS — H18413 Arcus senilis, bilateral: Secondary | ICD-10-CM | POA: Diagnosis not present

## 2016-12-31 DIAGNOSIS — H2513 Age-related nuclear cataract, bilateral: Secondary | ICD-10-CM | POA: Diagnosis not present

## 2017-01-01 MED FILL — PRAMIPEXOLE 0.5 MG TABLET: 0.5 | 90 days supply | Qty: 90 | Fill #3

## 2017-01-02 ENCOUNTER — Telehealth: Payer: Self-pay | Admitting: Hematology and Oncology

## 2017-01-02 NOTE — Telephone Encounter (Signed)
Received disability paperwork today, had them completed and faxed disability paperwork to [facility] fax [phone]

## 2017-01-03 DIAGNOSIS — Z736 Limitation of activities due to disability: Secondary | ICD-10-CM

## 2017-01-07 ENCOUNTER — Encounter: Payer: Self-pay | Admitting: Radiation Oncology

## 2017-01-07 ENCOUNTER — Telehealth: Payer: Self-pay | Admitting: Radiation Oncology

## 2017-01-07 NOTE — Telephone Encounter (Signed)
Placed orange with signed work letter in [employer] in radiation oncology nursing area.

## 2017-01-07 NOTE — Progress Notes (Signed)
Paperwork [place]) received 2/1, given to nurse 2/5

## 2017-01-08 ENCOUNTER — Encounter: Payer: Self-pay | Admitting: Radiation Oncology

## 2017-01-08 NOTE — Progress Notes (Signed)
Paperwork [employer]) notes released to [place] fax @ [phone], confirmation received 2/6

## 2017-01-22 ENCOUNTER — Other Ambulatory Visit: Payer: Self-pay

## 2017-01-22 ENCOUNTER — Telehealth: Payer: Self-pay | Admitting: Pharmacist

## 2017-01-22 MED ORDER — CAPECITABINE 500 MG PO TABS: 750.0000 mg/m2 | ORAL_TABLET | Freq: Two times a day (BID) | ORAL | 7 refills | Status: DC

## 2017-01-22 NOTE — Telephone Encounter (Signed)
Oral Chemotherapy Pharmacist Encounter  Received notification from breast nurse navigator that patient had reached out with questions about changing prescription medication insurance coverage and what that meant for Xeloda prescription.  I spoke with patient on 01/21/17, she sent me a copy of her new insurance card as we did not have that on file.  I have submitted for prior authorization of Xeloda on CoverMyMeds Key [id number] Status is pending  Once PA is obtained, we will send prescription [facility] a [facility] per insurance requirement.  Oral Oncology Clinic will continue to follow.  Johny Drilling, PharmD, BCPS, BCOP 01/22/2017  12:07 PM Oral Oncology Clinic [phone]

## 2017-01-22 NOTE — Telephone Encounter (Signed)
Oral Chemotherapy Pharmacist Encounter  Prior authorization for capecitabine approved by [facility] Effective dates: 01/22/17-01/22/18  Capecitabine prescription, insurance, demographic, and clinical information faxed to [facility] [place] phone number for follow-up: [phone]  Oral Oncology Clinic will continue to follow.  Johny Drilling, PharmD, BCPS, BCOP 01/22/2017  3:44 PM Oral Oncology Clinic [phone]

## 2017-01-28 ENCOUNTER — Other Ambulatory Visit (HOSPITAL_BASED_OUTPATIENT_CLINIC_OR_DEPARTMENT_OTHER): Payer: BC Managed Care – PPO

## 2017-01-28 ENCOUNTER — Telehealth: Payer: Self-pay | Admitting: Hematology and Oncology

## 2017-01-28 ENCOUNTER — Encounter: Payer: Self-pay | Admitting: Hematology and Oncology

## 2017-01-28 ENCOUNTER — Ambulatory Visit (HOSPITAL_BASED_OUTPATIENT_CLINIC_OR_DEPARTMENT_OTHER): Payer: BC Managed Care – PPO | Admitting: Hematology and Oncology

## 2017-01-28 DIAGNOSIS — C773 Secondary and unspecified malignant neoplasm of axilla and upper limb lymph nodes: Secondary | ICD-10-CM | POA: Diagnosis not present

## 2017-01-28 DIAGNOSIS — C50412 Malignant neoplasm of upper-outer quadrant of left female breast: Secondary | ICD-10-CM

## 2017-01-28 LAB — COMPREHENSIVE METABOLIC PANEL
ALT: 14 U/L (ref 0–55)
AST: 15 U/L (ref 5–34)
Albumin: 3.8 g/dL (ref 3.5–5.0)
Alkaline Phosphatase: 77 U/L (ref 40–150)
Anion Gap: 7 mEq/L (ref 3–11)
BUN: 15 mg/dL (ref 7.0–26.0)
CO2: 27 mEq/L (ref 22–29)
Calcium: 9.8 mg/dL (ref 8.4–10.4)
Chloride: 108 mEq/L (ref 98–109)
Creatinine: 1.1 mg/dL (ref 0.6–1.1)
EGFR: 55 mL/min/{1.73_m2} — ABNORMAL LOW (ref >90)
Glucose: 91 mg/dl (ref 70–140)
Potassium: 4.7 mEq/L (ref 3.5–5.1)
Sodium: 141 mEq/L (ref 136–145)
Total Bilirubin: 0.32 mg/dL (ref 0.20–1.20)
Total Protein: 7.3 g/dL (ref 6.4–8.3)

## 2017-01-28 LAB — CBC WITH DIFFERENTIAL/PLATELET
BASO%: 0.8 % (ref 0.0–2.0)
Basophils Absolute: 0 10*3/uL (ref 0.0–0.1)
EOS%: 5.7 % (ref 0.0–7.0)
Eosinophils Absolute: 0.3 10*3/uL (ref 0.0–0.5)
HCT: 39.5 % (ref 34.8–46.6)
HGB: 13 g/dL (ref 11.6–15.9)
LYMPH%: 17 % (ref 14.0–49.7)
MCH: 29.7 pg (ref 25.1–34.0)
MCHC: 33 g/dL (ref 31.5–36.0)
MCV: 90 fL (ref 79.5–101.0)
MONO#: 0.6 10*3/uL (ref 0.1–0.9)
MONO%: 10.8 % (ref 0.0–14.0)
NEUT#: 3.6 10*3/uL (ref 1.5–6.5)
NEUT%: 65.7 % (ref 38.4–76.8)
Platelets: 220 10*3/uL (ref 145–400)
RBC: 4.39 10*6/uL (ref 3.70–5.45)
RDW: 17.1 % — ABNORMAL HIGH (ref 11.2–14.5)
WBC: 5.5 10*3/uL (ref 3.9–10.3)
lymph#: 0.9 10*3/uL (ref 0.9–3.3)

## 2017-01-28 MED ORDER — CAPECITABINE 500 MG PO TABS: 1000.0000 mg/m2 | ORAL_TABLET | Freq: Two times a day (BID) | ORAL | 0 refills | Status: DC

## 2017-01-28 MED ORDER — CAPECITABINE 500 MG PO TABS: 1000.0000 mg/m2 | ORAL_TABLET | Freq: Two times a day (BID) | ORAL | 3 refills | Status: DC

## 2017-01-28 NOTE — Assessment & Plan Note (Signed)
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  PREVENT trial: [facility] [id number]: Patient is enrolled in this trial atorvastatin versus placebo No side effects related to the study drug.  Treatment summary:  1. Completed 4 cycles of dose dense Adriamycin and Cytoxan; and a 12 cycles ofAbraxanefrom 04/05/2016 to 08/16/2016 2. left lumpectomy 09/12/2016: IDC 0.5 cm, DCIS 1 cm, margins negative, 1/4 lymph nodes positive, T1aN1 stage II a pathologic stage 3. Adjuvant radiation 10/22/2016 to 12/11/2016 ------------------------------------------------------------------------------------------------------------------------------------ Current treatment: Adjuvant Xeloda 1500 mg twice daily 2 weeks on one week off started 01/07/2017 Xeloda toxicities:  Return to clinic in 6 weeks for follow-up with labs

## 2017-01-28 NOTE — Telephone Encounter (Signed)
Gave patient Avs and calendar for March appt.

## 2017-01-28 NOTE — Progress Notes (Signed)
Patient Care Team: [name], MD as PCP - General (Internal Medicine) [name], RN as [facility]  DIAGNOSIS:  Encounter Diagnosis  Name Primary?  . 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 She was started on 1500 mg by mouth twice a day and she appears to be tolerating it extremely well. She does not have any side effects to treatment. In fact she is requesting [place] to increase the dose to 2000 by mouth twice a day which is her regular dose. Denies any nausea vomiting.  Denies any diarrhea. Denies any hand-foot syndrome.  REVIEW OF SYSTEMS:   Constitutional: Denies fevers, chills or abnormal weight loss Eyes: Denies blurriness of vision Ears, nose, mouth, throat, and face: Denies mucositis or sore throat Respiratory: Denies cough, dyspnea or wheezes Cardiovascular: Denies palpitation, chest discomfort Gastrointestinal:  Denies nausea, heartburn or change in bowel habits Skin: Denies abnormal skin rashes Lymphatics: Denies new lymphadenopathy or easy bruising Neurological:Denies numbness, tingling or new weaknesses Behavioral/Psych: Mood is stable, no new changes  Extremities: No lower extremity edema All other systems were reviewed with the patient and are negative.  I have reviewed the past medical history, past surgical history, social history and family history with the patient and they are unchanged from previous note.  ALLERGIES:  is allergic to decadron [dexamethasone] and turmeric.  MEDICATIONS:  Current Outpatient Prescriptions  Medication Sig Dispense Refill  . buPROPion (WELLBUTRIN XL) 150 MG 24 hr tablet Take 150 mg by mouth daily.  4  . capecitabine (XELODA) 500 MG tablet Take 4 tablets (2,000 mg total) by mouth 2 (two) times daily after a meal. 14 days on and 1 week off 112 tablet 3  . capecitabine (XELODA) 500 MG tablet Take 4 tablets (2,000 mg total) by mouth 2 (two) times daily after a meal. 28 tablet 0  . citalopram (CELEXA) 40 MG tablet   3  . clonazePAM (KLONOPIN)  1 MG tablet Take 1.5 mg by mouth at bedtime.     . famotidine (PEPCID) 20 MG tablet Take 20 mg by mouth 2 (two) times daily.    Marland Kitchen nystatin-triamcinolone ointment (MYCOLOG) Apply 1 application topically 2 (two) times daily. 60 g 3  . pramipexole (MIRAPEX) 0.5 MG tablet Take 0.5 mg by mouth at bedtime.    . RESTASIS 0.05 % ophthalmic emulsion Place 1 drop into both eyes 2 (two) times daily as needed. For dry eyes.  4   No current facility-administered medications for this  visit.    Facility-Administered Medications Ordered in Other Visits  Medication Dose Route Frequency Provider Last Rate Last Dose  . sodium chloride flush (NS) 0.9 % injection 10 mL  10 mL Intracatheter PRN Nicholas Lose, MD   10 mL at 07/19/16 1434    PHYSICAL EXAMINATION: ECOG PERFORMANCE STATUS: 0 - Asymptomatic  Vitals:   01/28/17 1212  BP: 127/78  Pulse: 69  Resp: 17  Temp: 97.9 F (36.6 C)   Filed Weights   01/28/17 1212  Weight: 209 lb 3.2 oz (94.9 kg)    GENERAL:alert, no distress and comfortable SKIN: skin color, texture, turgor are normal, no rashes or significant lesions EYES: normal, Conjunctiva are pink and non-injected, sclera clear OROPHARYNX:no exudate, no erythema and lips, buccal mucosa, and tongue normal  NECK: supple, thyroid normal size, non-tender, without nodularity LYMPH:  no palpable lymphadenopathy in the cervical, axillary or inguinal LUNGS: clear to auscultation and percussion with normal breathing effort HEART: regular rate & rhythm and no murmurs and no lower extremity edema ABDOMEN:abdomen soft, non-tender and normal bowel sounds MUSCULOSKELETAL:no cyanosis of digits and no clubbing  NEURO: alert & oriented x 3 with fluent speech, no focal motor/sensory deficits EXTREMITIES: No lower extremity edema   LABORATORY DATA:  I have reviewed the data as listed   Chemistry      Component Value Date/Time   NA 141 01/28/2017 1146   K 4.7 01/28/2017 1146   CL 108 04/07/2016 0345   CO2 27 01/28/2017 1146   BUN 15.0 01/28/2017 1146   CREATININE 1.1 01/28/2017 1146      Component Value Date/Time   CALCIUM 9.8 01/28/2017 1146   ALKPHOS 77 01/28/2017 1146   AST 15 01/28/2017 1146   ALT 14 01/28/2017 1146   BILITOT 0.32 01/28/2017 1146       Lab Results  Component Value Date   WBC 5.5 01/28/2017   HGB 13.0 01/28/2017   HCT 39.5 01/28/2017   MCV 90.0 01/28/2017   PLT 220 01/28/2017   NEUTROABS 3.6 01/28/2017    ASSESSMENT & PLAN:    Breast cancer of upper-outer quadrant of left female breast ([place]) Mammogram 03/02/2016: Hypoechoic masses in the upper outer quadrant left breast 2 adjacent irregular masses 2.5-1.4 x 1.2 cm and the other 2.7 x 1.4 x 2.3 cm total span 6.4 cm, enlarged multiple axillary lymph nodes measuring 1.7 cm, T2 N1/N2 stage IIb vs IIIa Left breast biopsy 03/19/2016 12:30 position: Invasive ductal carcinoma with DCIS, 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  PREVENT trial: [facility] [id number]: Patient is enrolled in this trial atorvastatin versus placebo No side effects related to the study drug.  Treatment summary:  1. Completed 4 cycles of dose dense Adriamycin and Cytoxan; and a 12  cycles ofAbraxanefrom 04/05/2016 to 08/16/2016 2. left lumpectomy 09/12/2016: IDC 0.5 cm, DCIS 1 cm, margins negative, 1/4 lymph nodes positive, T1aN1 stage II a pathologic stage 3. Adjuvant radiation 10/22/2016 to 12/11/2016 ------------------------------------------------------------------------------------------------------------------------------------ Current treatment: Adjuvant Xeloda 1500 mg twice daily 2 weeks on one week off started 01/07/2017, increasing the dose to 2011 g by mouth twice a day from this cycle starting today 01/28/2017  Xeloda toxicities: Denies any nausea, diarrhea, hand-foot syndrome. Labs were reviewed and they were normal. I called and discussed the case with her pharmacist to get her the remainder of the medications to make it to 2000 mg by mouth twice a day.  Return to clinic in 3 weeks for follow-up with labs   I spent 25 minutes talking to the patient of which more than half was spent in counseling and coordination of care.  Orders Placed This Encounter  Procedures  . CBC with  Differential    Standing Status:   Future    Standing Expiration Date:   01/28/2018  . Comprehensive metabolic panel    Standing Status:   Future    Standing Expiration Date:   01/28/2018   The patient has a good understanding of the overall plan. she agrees with it. she will call with any problems that may develop before the next visit here.   Rulon Eisenmenger, MD 01/28/17

## 2017-01-30 NOTE — Progress Notes (Deleted)
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  Skin status: What lotion are you using?  Have you seen med onc? If not, when is appointment: If they are ER+, have they started Al or Tamoxifen? If not, why?  Discuss survivorship appointment.  Have you had a mammogram scheduled? Not scheduled yet Offer referral to [facility]. Will receive at the survivorship appointment. Oncotype Dx. Score: Appetite: Pain: Fatigue: Arm mobility: Lymphedema:

## 2017-02-05 ENCOUNTER — Encounter: Payer: Self-pay | Admitting: Radiation Oncology

## 2017-02-05 ENCOUNTER — Ambulatory Visit
Admission: RE | Admit: 2017-02-05 | Discharge: 2017-02-05 | Disposition: A | Discharge status: Home / Self Care | Payer: BC Managed Care – PPO | Source: Ambulatory Visit | Attending: Radiation Oncology | Admitting: Radiation Oncology

## 2017-02-05 VITALS — BP 109/50 | HR 78 | Temp 98.3°F | Resp 18 | Ht 64.0 in | Wt 209.4 lb

## 2017-02-05 DIAGNOSIS — C50412 Malignant neoplasm of upper-outer quadrant of left female breast: Secondary | ICD-10-CM | POA: Diagnosis not present

## 2017-02-05 DIAGNOSIS — Z888 Allergy status to other drugs, medicaments and biological substances status: Secondary | ICD-10-CM | POA: Diagnosis not present

## 2017-02-05 DIAGNOSIS — Z9221 Personal history of antineoplastic chemotherapy: Secondary | ICD-10-CM | POA: Insufficient documentation

## 2017-02-05 DIAGNOSIS — Z79899 Other long term (current) drug therapy: Secondary | ICD-10-CM | POA: Insufficient documentation

## 2017-02-05 DIAGNOSIS — Z923 Personal history of irradiation: Secondary | ICD-10-CM | POA: Insufficient documentation

## 2017-02-05 NOTE — Progress Notes (Signed)
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  Skin status:Left breast color normal and intact. What lotion are you using? Not using any lotion with vitamin E. Have you seen med onc? If not, when is appointment:01-28-17 Dr. Lindi Adie   Xeloda 2000 mg by mouth twice a day 2 weeks on one week off for 6 months If they are ER+, have they started Al or Tamoxifen? If not, why?  Triple negative Discuss survivorship appointment. Not scheduled Have you had a mammogram scheduled? Not scheduled yet Offer referral to [facility]. Will receive at the survivorship appointment. Oncotype Dx. Score:None Appetite:Good eating three meals per day. Pain:None Fatigue:Morning are worse. Any thing she does is for short periods. Arm mobility:Able to raise with little discomfort. Lymphedema:None Wt Readings from Last 3 Encounters:  02/05/17 209 lb 6.4 oz (95 kg)  01/28/17 209 lb 3.2 oz (94.9 kg)  12/19/16 210 lb 11.2 oz (95.6 kg)  BP (!) 109/50   Pulse 78   Temp 98.3 F (36.8 C) (Oral)   Resp 18   Ht 5\' 4"  (1.626 m)   Wt 209 lb 6.4 oz (95 kg)   SpO2 100%   BMI 35.94 kg/m

## 2017-02-05 NOTE — Progress Notes (Signed)
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      Interval Since Last Radiation:  8 weeks   10/22/2016 to 12/11/2016:  1. The Left breast [4 field - Breath hold] was treated to 50.4 Gy in 28 fractions at 1.8 Gy per fraction. 2. The Left breast was boosted to 10 Gy in 5 fractions at 2 Gy per fraction.    Narrative:  The patient returns today for routine follow-up. During treatment she did very well with radiotherapy and did not have significant desquamation.  She started Xeloda in February for consolidation. She continues to be followed by Dr. Lindi Adie.  On review of systems, the patient states she's doing very well. She denies any concerns with her skin at this time. She is tolerating Xeloda very well without complaints. No other concerns are verbalized.  ALLERGIES:  is allergic to decadron [dexamethasone] and turmeric.  Meds: Current Outpatient Prescriptions  Medication Sig Dispense Refill  . buPROPion (WELLBUTRIN XL) 150 MG 24 hr tablet Take 150 mg by mouth daily.  4  . capecitabine (XELODA) 500 MG tablet Take 4 tablets (2,000 mg total) by mouth 2 (two) times daily after a meal. 14 days on and 1 week off 112 tablet 3  . capecitabine (XELODA) 500 MG tablet Take 4 tablets (2,000 mg total) by mouth 2 (two) times daily after a meal. 28 tablet 0  . cetirizine (ZYRTEC ALLERGY) 10 MG tablet Take 10 mg by mouth daily.    . citalopram (CELEXA) 40 MG tablet   3  . clonazePAM (KLONOPIN) 1 MG tablet Take 1.5 mg by mouth at bedtime.     . famotidine (PEPCID) 20 MG tablet Take 20 mg by mouth 2 (two) times daily.    Marland Kitchen nystatin-triamcinolone ointment (MYCOLOG) Apply 1 application topically 2  (two) times daily. 60 g 3  . pramipexole (MIRAPEX) 0.5 MG tablet Take 0.5 mg by mouth at bedtime.    . RESTASIS 0.05 % ophthalmic emulsion Place 1 drop into both eyes 2 (two) times daily as needed. For dry eyes.  4  . zolpidem (AMBIEN) 5 MG tablet Take 5 mg by mouth at bedtime as needed for sleep.     No current facility-administered medications for this encounter.    Facility-Administered Medications Ordered in Other Encounters  Medication Dose Route Frequency Provider Last Rate Last Dose  . sodium chloride flush (NS) 0.9 % injection 10 mL  10 mL Intracatheter PRN Nicholas Lose, MD   10 mL at 07/19/16 1434    Physical Findings:  height is 5\' 4"  (1.626 m) and weight is 209 lb 6.4 oz (95 kg). Her oral temperature is 98.3 F (36.8 C). Her blood pressure is 109/50 (abnormal) and her pulse is 78. Her respiration is 18 and oxygen saturation is 100%.  Pain Assessment Pain Score: 0-No pain/10 In general this is a well appearing caucasian female in no acute distress. She's alert and oriented x4 and appropriate throughout the examination. Cardiopulmonary assessment is negative for acute distress and she exhibits normal effort. The left breast was examined and reveals no evidence of desquamation or hyperpigmentation.   Lab Findings: Lab Results  Component Value Date   WBC  5.5 01/28/2017   HGB 13.0 01/28/2017   HCT 39.5 01/28/2017   MCV 90.0 01/28/2017   PLT 220 01/28/2017     Radiographic Findings: No results found.  Impression/Plan: 1. IIA vs IIA, T2 N1/N2Triple Negative invasive ductal carcinoma with DCIS of upper-outer quadrant of left breast. The patient has been doing well since completion of radiotherapy. We discussed that we would be happy to continue to follow her as needed, but she will also continue to follow up with Dr. Lindi Adie in medical oncology and remains on Xeloda chemotherapy. She was counseled on skin care as well as measures to avoid sun exposure to this area.   2. Survivorship. She will be referred to survivorship once she is NED and off systemic therapy per medical oncology.     Carola Rhine, PAC

## 2017-02-18 ENCOUNTER — Other Ambulatory Visit (HOSPITAL_BASED_OUTPATIENT_CLINIC_OR_DEPARTMENT_OTHER): Payer: BC Managed Care – PPO

## 2017-02-18 ENCOUNTER — Encounter: Payer: Self-pay | Admitting: Hematology and Oncology

## 2017-02-18 ENCOUNTER — Ambulatory Visit (HOSPITAL_BASED_OUTPATIENT_CLINIC_OR_DEPARTMENT_OTHER): Payer: BC Managed Care – PPO | Admitting: Hematology and Oncology

## 2017-02-18 DIAGNOSIS — Z9221 Personal history of antineoplastic chemotherapy: Secondary | ICD-10-CM | POA: Diagnosis not present

## 2017-02-18 DIAGNOSIS — F064 Anxiety disorder due to known physiological condition: Secondary | ICD-10-CM | POA: Diagnosis not present

## 2017-02-18 DIAGNOSIS — Z923 Personal history of irradiation: Secondary | ICD-10-CM | POA: Diagnosis not present

## 2017-02-18 DIAGNOSIS — C773 Secondary and unspecified malignant neoplasm of axilla and upper limb lymph nodes: Secondary | ICD-10-CM

## 2017-02-18 DIAGNOSIS — Z171 Estrogen receptor negative status [ER-]: Secondary | ICD-10-CM

## 2017-02-18 DIAGNOSIS — C50412 Malignant neoplasm of upper-outer quadrant of left female breast: Secondary | ICD-10-CM | POA: Diagnosis not present

## 2017-02-18 LAB — COMPREHENSIVE METABOLIC PANEL
ALT: 22 U/L (ref 0–55)
AST: 19 U/L (ref 5–34)
Albumin: 3.7 g/dL (ref 3.5–5.0)
Alkaline Phosphatase: 70 U/L (ref 40–150)
Anion Gap: 9 mEq/L (ref 3–11)
BUN: 15.4 mg/dL (ref 7.0–26.0)
CO2: 25 mEq/L (ref 22–29)
Calcium: 9.3 mg/dL (ref 8.4–10.4)
Chloride: 106 mEq/L (ref 98–109)
Creatinine: 0.9 mg/dL (ref 0.6–1.1)
EGFR: 68 mL/min/{1.73_m2} — ABNORMAL LOW (ref >90)
Glucose: 87 mg/dl (ref 70–140)
Potassium: 3.9 mEq/L (ref 3.5–5.1)
Sodium: 141 mEq/L (ref 136–145)
Total Bilirubin: 0.57 mg/dL (ref 0.20–1.20)
Total Protein: 6.9 g/dL (ref 6.4–8.3)

## 2017-02-18 LAB — CBC WITH DIFFERENTIAL/PLATELET
BASO%: 0.4 % (ref 0.0–2.0)
Basophils Absolute: 0 10*3/uL (ref 0.0–0.1)
EOS%: 6.7 % (ref 0.0–7.0)
Eosinophils Absolute: 0.3 10*3/uL (ref 0.0–0.5)
HCT: 40 % (ref 34.8–46.6)
HGB: 12.9 g/dL (ref 11.6–15.9)
LYMPH%: 22.3 % (ref 14.0–49.7)
MCH: 30 pg (ref 25.1–34.0)
MCHC: 32.3 g/dL (ref 31.5–36.0)
MCV: 93 fL (ref 79.5–101.0)
MONO#: 0.5 10*3/uL (ref 0.1–0.9)
MONO%: 10.4 % (ref 0.0–14.0)
NEUT#: 2.8 10*3/uL (ref 1.5–6.5)
NEUT%: 60.2 % (ref 38.4–76.8)
Platelets: 182 10*3/uL (ref 145–400)
RBC: 4.3 10*6/uL (ref 3.70–5.45)
RDW: 18.3 % — ABNORMAL HIGH (ref 11.2–14.5)
WBC: 4.6 10*3/uL (ref 3.9–10.3)
lymph#: 1 10*3/uL (ref 0.9–3.3)

## 2017-02-18 NOTE — Progress Notes (Signed)
Patient Care Team: [name], MD as PCP - General (Internal Medicine) [name], RN as [facility] Management  DIAGNOSIS:  Encounter Diagnosis  Name Primary?  . Breast cancer of upper-outer quadrant of left female breast ([place])     SUMMARY OF ONCOLOGIC HISTORY:   Breast cancer of upper-outer quadrant of left female breast (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      10/22/2016 - 12/11/2016 Radiation Therapy    Adjuvant radiation therapy      01/14/2017 -  Chemotherapy    Xeloda 2000 mg by mouth twice a day 2 weeks on one week off for 6 months       CHIEF COMPLIANT: Follow-up on Xeloda  INTERVAL HISTORY: Alexis Fox is a 62 year old with above-mentioned history of triple negative breast cancer with neoadjuvant chemotherapy followed by surgery and radiation and is currently on Xeloda. She is tolerating Xeloda fairly well. She denies any nausea or vomiting. She does have intermittent loose stools. She also complains of fatigue.  REVIEW OF SYSTEMS:   Constitutional: Denies fevers, chills or abnormal weight loss Eyes:  Denies blurriness of vision Ears, nose, mouth, throat, and face: Denies mucositis or sore throat Respiratory: Denies cough, dyspnea or wheezes Cardiovascular: Denies palpitation, chest discomfort Gastrointestinal:  Denies nausea, heartburn or change in bowel habits Skin: Denies abnormal skin rashes Lymphatics: Denies new lymphadenopathy or easy bruising Neurological:Denies numbness, tingling or new weaknesses Behavioral/Psych: Severe anxiety related to breast cancer recurrence.  Extremities: No lower extremity edema Breast:  denies any pain or lumps or nodules in either breasts All other systems were reviewed with the patient and are negative.  I have reviewed the past medical history, past surgical history, social history and family history with the patient and they are unchanged from previous note.  ALLERGIES:  is allergic to decadron [dexamethasone] and turmeric.  MEDICATIONS:  Current Outpatient Prescriptions  Medication Sig Dispense Refill  . buPROPion (WELLBUTRIN XL) 150 MG 24 hr tablet Take 150 mg by mouth daily.  4  . capecitabine (XELODA) 500 MG tablet Take 4 tablets (2,000 mg total) by mouth 2 (two) times daily after a meal. 14 days on and 1 week off 112 tablet 3  . capecitabine (XELODA) 500 MG tablet Take 4 tablets (2,000 mg total) by mouth 2 (two) times daily after a meal. 28 tablet 0  . cetirizine (ZYRTEC ALLERGY) 10 MG tablet Take 10 mg by mouth daily.    . citalopram (CELEXA) 40 MG tablet   3  . clonazePAM (KLONOPIN) 1 MG tablet  Take 1.5 mg by mouth at bedtime.     . famotidine (PEPCID) 20 MG tablet Take 20 mg by mouth 2 (two) times daily.    Marland Kitchen nystatin-triamcinolone ointment (MYCOLOG) Apply 1 application topically 2 (two) times daily. 60 g 3  . pramipexole (MIRAPEX) 0.5 MG tablet Take 0.5 mg by mouth at bedtime.    . RESTASIS 0.05 % ophthalmic emulsion Place 1 drop into both eyes 2 (two) times daily as needed. For dry eyes.  4  . zolpidem (AMBIEN) 5 MG tablet Take 5 mg  by mouth at bedtime as needed for sleep.     No current facility-administered medications for this visit.    Facility-Administered Medications Ordered in Other Visits  Medication Dose Route Frequency Provider Last Rate Last Dose  . sodium chloride flush (NS) 0.9 % injection 10 mL  10 mL Intracatheter PRN Nicholas Lose, MD   10 mL at 07/19/16 1434    PHYSICAL EXAMINATION: ECOG PERFORMANCE STATUS: 1 - Symptomatic but completely ambulatory  Vitals:   02/18/17 1349  BP: 115/64  Pulse: 77  Resp: 18  Temp: 98 F (36.7 C)   Filed Weights   02/18/17 1349  Weight: 211 lb 14.4 oz (96.1 kg)    GENERAL:alert, no distress and comfortable SKIN: skin color, texture, turgor are normal, no rashes or significant lesions EYES: normal, Conjunctiva are pink and non-injected, sclera clear OROPHARYNX:no exudate, no erythema and lips, buccal mucosa, and tongue normal  NECK: supple, thyroid normal size, non-tender, without nodularity LYMPH:  no palpable lymphadenopathy in the cervical, axillary or inguinal LUNGS: clear to auscultation and percussion with normal breathing effort HEART: regular rate & rhythm and no murmurs and no lower extremity edema ABDOMEN:abdomen soft, non-tender and normal bowel sounds MUSCULOSKELETAL:no cyanosis of digits and no clubbing  NEURO: alert & oriented x 3 with fluent speech, no focal motor/sensory deficits EXTREMITIES: No lower extremity edema   LABORATORY DATA:  I have reviewed the data as listed   Chemistry      Component Value Date/Time   NA 141 01/28/2017 1146   K 4.7 01/28/2017 1146   CL 108 04/07/2016 0345   CO2 27 01/28/2017 1146   BUN 15.0 01/28/2017 1146   CREATININE 1.1 01/28/2017 1146      Component Value Date/Time   CALCIUM 9.8 01/28/2017 1146   ALKPHOS 77 01/28/2017 1146   AST 15 01/28/2017 1146   ALT 14 01/28/2017 1146   BILITOT 0.32 01/28/2017 1146       Lab Results  Component Value Date   WBC 4.6 02/18/2017   HGB 12.9 02/18/2017    HCT 40.0 02/18/2017   MCV 93.0 02/18/2017   PLT 182 02/18/2017   NEUTROABS 2.8 02/18/2017    ASSESSMENT & PLAN:  Breast cancer of upper-outer quadrant of left female breast ([place]) Mammogram 03/02/2016: Hypoechoic masses in the upper outer quadrant left breast 2 adjacent irregular masses 2.5-1.4 x 1.2 cm and the other 2.7 x 1.4 x 2.3 cm total span 6.4 cm, enlarged multiple axillary lymph nodes measuring 1.7 cm, T2 N1/N2 stage IIb vs IIIa Left breast biopsy 03/19/2016 12:30 position: Invasive ductal carcinoma with DCIS, 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  PREVENT trial: [facility] [id number]: Patient is enrolled in this trial atorvastatin versus placebo No side effects related to the  study drug.  Treatment summary:  1. Completed 4 cycles of dose dense Adriamycin and Cytoxan; and a 12 cycles ofAbraxanefrom 04/05/2016 to 08/16/2016 2. left lumpectomy 09/12/2016: IDC 0.5 cm, DCIS 1 cm, margins negative, 1/4 lymph nodes positive, T1aN1 stage II a pathologic stage 3. Adjuvant radiation 10/22/2016 to 12/11/2016 ------------------------------------------------------------------------------------------------------------------------------------ Current treatment: Adjuvant Xeloda 1500 mg twice daily 2 weeks on one week off started 01/07/2017, increasing the dose to 2000 g by mouth twice a day from 01/28/2017 (plan to treat her for 6 months)  Xeloda toxicities: Denies any nausea, diarrhea, hand-foot syndrome. Patient has diarrhea intermittently Fatigue Labs were reviewed and they were normal.  Patient has profound anxiety related to her prior PTSD and having had the breast cancer. She is extremely worried about risk of recurrence. She is incapacitated when these anxiety spells happen  She will be  applying for Social Security and disability. We will fill the paperwork when it is provided was  Return to clinic in 6 weeks for follow-up with labs  I spent 25 minutes talking to the patient of which more than half was spent in counseling and coordination of care.  No orders of the defined types were placed in this encounter.  The patient has a good understanding of the overall plan. she agrees with it. she will call with any problems that may develop before the next visit here.   Rulon Eisenmenger, MD 02/18/17

## 2017-02-18 NOTE — Assessment & Plan Note (Addendum)
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  PREVENT trial: [facility] [id number]: Patient is enrolled in this trial atorvastatin versus placebo No side effects related to the study drug.  Treatment summary:  1. Completed 4 cycles of dose dense Adriamycin and Cytoxan; and a 12 cycles ofAbraxanefrom 04/05/2016 to 08/16/2016 2. left lumpectomy 09/12/2016: IDC 0.5 cm, DCIS 1 cm, margins negative, 1/4 lymph nodes positive, T1aN1 stage II a pathologic stage 3. Adjuvant radiation 10/22/2016 to 12/11/2016 ------------------------------------------------------------------------------------------------------------------------------------ Current treatment: Adjuvant Xeloda 1500 mg twice daily 2 weeks on one week off started 01/07/2017, increasing the dose to 2000 g by mouth twice a day from 01/28/2017 (plan to treat her for 6 months)  Xeloda toxicities: Denies any nausea, diarrhea, hand-foot syndrome. Labs were reviewed and they were normal.  Return to clinic in 6 weeks for follow-up with labs

## 2017-02-19 ENCOUNTER — Encounter: Payer: Self-pay | Admitting: Hematology and Oncology

## 2017-02-19 ENCOUNTER — Telehealth: Payer: Self-pay | Admitting: Hematology and Oncology

## 2017-02-19 ENCOUNTER — Other Ambulatory Visit: Payer: Self-pay | Admitting: *Deleted

## 2017-02-19 DIAGNOSIS — C50412 Malignant neoplasm of upper-outer quadrant of left female breast: Secondary | ICD-10-CM

## 2017-02-19 MED ORDER — INV-ATORVASTATIN/PLACEBO 40 MG TABS WAKE FOREST WF 98213: 1.0000 | ORAL_TABLET | Freq: Every day | ORAL | 0 refills | Status: DC

## 2017-02-19 NOTE — Telephone Encounter (Signed)
Patient called to r/s appt from 5/01 to 4/30. Patient can only do Mondays.

## 2017-02-20 ENCOUNTER — Encounter: Payer: Self-pay | Admitting: Hematology and Oncology

## 2017-02-21 ENCOUNTER — Ambulatory Visit (INDEPENDENT_AMBULATORY_CARE_PROVIDER_SITE_OTHER): Payer: BC Managed Care – PPO

## 2017-02-21 ENCOUNTER — Encounter: Payer: Self-pay | Admitting: Podiatrist

## 2017-02-21 ENCOUNTER — Ambulatory Visit (INDEPENDENT_AMBULATORY_CARE_PROVIDER_SITE_OTHER): Payer: BC Managed Care – PPO | Admitting: Podiatrist

## 2017-02-21 DIAGNOSIS — M6790 Unspecified disorder of synovium and tendon, unspecified site: Secondary | ICD-10-CM | POA: Diagnosis not present

## 2017-02-21 DIAGNOSIS — M25572 Pain in left ankle and joints of left foot: Secondary | ICD-10-CM | POA: Diagnosis not present

## 2017-02-21 MED ORDER — DICLOFENAC SODIUM 1 % TD GEL: 2.0000 g | Freq: Four times a day (QID) | TRANSDERMAL | 2 refills | Status: DC

## 2017-02-21 NOTE — Progress Notes (Signed)
Chief Complaint  Patient presents with  . Foot Pain    Medial foot and ankle left   "I have been having a lot of pain and swelling on this side of the foot. I saw my orthopedist and they have done injections, boot, xrays and MRI-not any better"    Patient has pain medial side of the left foot. She has tried treatment from her orthopedic surgeon with little relief.  She relates the pain is worsening in nature and hurts when walking.  GENERAL APPEARANCE: Alert, conversant. Appropriately groomed. No acute distress.  VASCULAR: Pedal pulses palpable at 2/4 DP and PT bilateral.  Capillary refill time is immediate to all digits,  Proximal to distal cooling it warm to warm.  Digital hair growth is present bilateral  NEUROLOGIC: sensation is intact epicritically and protectively to 5.07 monofilament at 5/5 sites bilateral.  Light touch is intact bilateral, vibratory sensation intact bilateral, achilles tendon reflex is intact bilateral.  MUSCULOSKELETAL: pain medial aspect of the left foot at the medial ankle. Pain is pinpoint just below the medial malleolus.  No ecchymosis noted.   DERMATOLOGIC: skin color, texture, and turger are within normal limits.    Mri show tendinosis without tear on tibialis posterior tendon.  Fluid also seen in fhl also consistent with tendionsis.   Assesement:  Tendinosis  Plan:  Referral for physical therapy-- [facility] for the left foot.  rx for voltaren gel called in.  She will call if no improvement with physical therapy.

## 2017-02-21 NOTE — Patient Instructions (Signed)
Valerie from our office will be calling you regarding the Physical Therapy referral.  If you have not heard from her in a week please call our office.

## 2017-02-26 ENCOUNTER — Telehealth: Payer: Self-pay | Admitting: *Deleted

## 2017-02-26 DIAGNOSIS — M6790 Unspecified disorder of synovium and tendon, unspecified site: Secondary | ICD-10-CM

## 2017-02-26 DIAGNOSIS — M25572 Pain in left ankle and joints of left foot: Principal | ICD-10-CM

## 2017-02-26 DIAGNOSIS — G8929 Other chronic pain: Secondary | ICD-10-CM

## 2017-02-26 NOTE — Telephone Encounter (Addendum)
-----   Message from Rip Harbour, [facility] sent at 02/21/2017  4:19 PM EDT ----- Regarding: PT referral  Pt needs referral to [facility] in [place]. 02/26/2017-Required referral form and demographics faxed to [place].03/01/2017-Dr.Egerton ordered [place] Antiinflammatory Cream. Orders faxed to [employer].

## 2017-03-01 MED ORDER — NONFORMULARY OR COMPOUNDED ITEM: 2 refills | Status: DC

## 2017-03-05 ENCOUNTER — Ambulatory Visit (HOSPITAL_BASED_OUTPATIENT_CLINIC_OR_DEPARTMENT_OTHER): Payer: BC Managed Care – PPO | Admitting: Adult Health

## 2017-03-05 ENCOUNTER — Telehealth: Payer: Self-pay | Admitting: *Deleted

## 2017-03-05 ENCOUNTER — Encounter: Payer: Self-pay | Admitting: Adult Health

## 2017-03-05 VITALS — BP 132/60 | HR 74 | Temp 97.7°F | Resp 20 | Wt 211.0 lb

## 2017-03-05 DIAGNOSIS — C773 Secondary and unspecified malignant neoplasm of axilla and upper limb lymph nodes: Secondary | ICD-10-CM

## 2017-03-05 DIAGNOSIS — C50412 Malignant neoplasm of upper-outer quadrant of left female breast: Secondary | ICD-10-CM | POA: Diagnosis not present

## 2017-03-05 DIAGNOSIS — N644 Mastodynia: Secondary | ICD-10-CM

## 2017-03-05 NOTE — Telephone Encounter (Signed)
Received call from patient stating she has some discomfort in her left breast for the past few days.  She describes it as discomfort in her breast tissue. She also complains of some pain in her scapula. Symptoms are vague and she only describes it as a discomfort.  Patient agreeable to see Ria Comment today at 1130am.  Appt made and confirmed

## 2017-03-05 NOTE — Progress Notes (Signed)
Patient Care Team: [name], MD as PCP - General (Internal Medicine) [name], RN as [place] [facility]  DIAGNOSIS:  No diagnosis found.  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 She is here for new left breast pain that is radiating around her arm.  She hasn't experienced any breast pain since then until about a week ago.  This is getting worse and she is concerned.  She is doing well with the xeloda, and has not had any problems with it since then.    REVIEW OF SYSTEMS:   Review of Systems    Constitutional: Negative for chills, fever, malaise/fatigue and weight loss.  HENT: Negative for hearing loss and tinnitus.   Eyes: Negative for blurred vision and double vision.  Respiratory: Negative for cough and shortness of breath.   Cardiovascular: Negative for chest pain, palpitations and leg swelling.  Gastrointestinal: Negative for abdominal pain, blood in stool, constipation, diarrhea, heartburn, melena, nausea and vomiting.  Genitourinary: Negative for dysuria.  Skin: Negative for rash.  Neurological: Negative for dizziness, tingling, sensory change, focal weakness and headaches.     I have reviewed the past medical history, past surgical history, social history and family history with the patient and they are unchanged from previous note.  ALLERGIES:  is allergic to turmeric and decadron [dexamethasone].  MEDICATIONS:  Current Outpatient Prescriptions  Medication Sig Dispense Refill  . Atorvastatin Calcium (INVESTIGATIONAL ATORVASTATIN/PLACEBO) 40 MG tablet [facility] [id number] Take 1 tablet by mouth daily. Take 1 tablet daily with or without food. 180 tablet 0  . baclofen (LIORESAL) 20 MG tablet     . buPROPion (WELLBUTRIN XL) 150 MG 24 hr tablet Take 150 mg by mouth daily.  4  . capecitabine (XELODA) 500 MG tablet Take 4 tablets (2,000 mg total) by mouth 2 (two) times daily after a meal. 28 tablet 0  . cetirizine (ZYRTEC ALLERGY) 10 MG tablet Take 10 mg by mouth daily.    Marland [facility]  citalopram (CELEXA) 40 MG tablet   3  . clonazePAM (KLONOPIN) 1 MG tablet Take 1.5 mg by mouth at bedtime.     . diclofenac sodium (VOLTAREN) 1 % GEL Apply 2 g topically 4 (four) times daily. Rub into affected area of foot 2 to 4 times daily 100 g 2  . famotidine (PEPCID) 20 MG tablet Take 20 mg by mouth 2 (two) times daily.    . fluticasone (FLONASE) 50 MCG/ACT nasal spray     . ipratropium (ATROVENT) 0.06 % nasal spray     . NONFORMULARY OR COMPOUNDED ITEM [facility]:  Antiinflammatory Cream  - Diclofenac 35, Baclofen 2%, Lidocaine 2%, apply 1-2 grams to affected area 3-4 times daily. 120 each 2  . nystatin-triamcinolone ointment (MYCOLOG) Apply 1 application topically 2 (two) times daily. 60 g 3  . pramipexole (MIRAPEX) 0.5 MG tablet Take 0.5 mg by mouth at bedtime.    . RESTASIS 0.05 % ophthalmic emulsion Place 1 drop into both eyes 2 (two) times daily as needed. For dry eyes.  4  . zolpidem (AMBIEN) 5 MG tablet Take 5 mg by mouth at bedtime as needed for sleep.     No current facility-administered medications for this visit.    Facility-Administered Medications Ordered in Other Visits  Medication Dose Route Frequency Provider Last Rate Last Dose  . sodium chloride flush (NS) 0.9 % injection 10 mL  10 mL Intracatheter PRN Nicholas Lose, MD   10 mL at 07/19/16 1434    PHYSICAL EXAMINATION: ECOG PERFORMANCE STATUS: 1 - Symptomatic but completely ambulatory  Vitals:   03/05/17 1332  BP: 132/60  Pulse: 74  Resp: 20  Temp: 97.7 F (36.5 C)   Filed Weights   03/05/17 1332  Weight: 211 lb (95.7 kg)   GENERAL: Patient is a well appearing female in no acute distress HEENT:  Sclerae anicteric.  Oropharynx clear and moist. No ulcerations or evidence of oropharyngeal candidiasis. Neck is supple.  NODES:  No cervical, supraclavicular, or axillary lymphadenopathy palpated.  BREAST EXAM: bilateral breasts without any nodules, masses, skin or nipple changes, no erythema, there is tenderness in the left breast upper outer quadrant, along with in left axilla, no swelling, warmth, redness noted LUNGS:  Clear to auscultation bilaterally.  No wheezes or rhonchi. HEART:  Regular rate and rhythm. No murmur appreciated. ABDOMEN:  Soft, nontender.  Positive, normoactive bowel sounds. No organomegaly palpated. MSK:  No focal spinal tenderness to palpation. Full range of motion bilaterally in the upper extremities. EXTREMITIES:  No peripheral edema.   SKIN:  Clear with no obvious rashes or  skin changes. No nail dyscrasia. NEURO:  Nonfocal. Well oriented.  Appropriate affect.     LABORATORY DATA:  I have reviewed the data as listed   Chemistry      Component Value Date/Time   NA 141 02/18/2017 1327   K 3.9 02/18/2017 1327   CL 108 04/07/2016 0345   CO2 25 02/18/2017 1327   BUN 15.4 02/18/2017 1327   CREATININE 0.9 02/18/2017 1327      Component Value Date/Time   CALCIUM 9.3 02/18/2017 1327   ALKPHOS 70 02/18/2017 1327   AST 19 02/18/2017 1327   ALT 22 02/18/2017 1327   BILITOT 0.57 02/18/2017 1327       Lab Results  Component Value Date   WBC 4.6 02/18/2017   HGB 12.9 02/18/2017   HCT 40.0 02/18/2017   MCV 93.0 02/18/2017   PLT 182 02/18/2017   NEUTROABS 2.8  02/18/2017    ASSESSMENT & PLAN:  Breast cancer of upper-outer quadrant of left female breast ([place]) Mammogram 03/02/2016: Hypoechoic masses in the upper outer quadrant left breast 2 adjacent irregular masses 2.5-1.4 x 1.2 cm and the other 2.7 x 1.4 x 2.3 cm total span 6.4 cm, enlarged multiple axillary lymph nodes measuring 1.7 cm, T2 N1/N2 stage IIb vs IIIa Left breast biopsy 03/19/2016 12:30 position: Invasive ductal carcinoma with DCIS, 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  PREVENT trial: [facility] [id number]: Patient is enrolled in this trial atorvastatin versus placebo No side effects related to the study drug.  Treatment summary:  1. Completed 4 cycles of dose dense Adriamycin and Cytoxan; and a 12 cycles ofAbraxanefrom 04/05/2016 to 08/16/2016 2. left lumpectomy 09/12/2016: IDC 0.5 cm, DCIS 1 cm, margins negative, 1/4 lymph nodes positive, T1aN1 stage II a pathologic stage 3. Adjuvant radiation 10/22/2016 to  12/11/2016 ------------------------------------------------------------------------------------------------------------------------------------ Current treatment: Adjuvant Xeloda 1500 mg twice daily 2 weeks on one week off started 01/07/2017, increasing the dose to 2000 g by mouth twice a day from 01/28/2017 (plan to treat her for 6 months)  Xeloda toxicities: Denies any nausea, diarrhea, hand-foot syndrome. Doing well on Xeloda.  Breast Pain: will get mammogram and left breast/axilla ultrasound to evaluate this new pain.    I spent 25 minutes talking to the patient of which more than half was spent in counseling and coordination of care.   The patient has a good understanding of the overall plan. she agrees with it. she will call with any problems that may develop before the next visit here.   Scot Dock, NP 03/05/17

## 2017-03-08 ENCOUNTER — Ambulatory Visit
Admission: RE | Admit: 2017-03-08 | Discharge: 2017-03-08 | Disposition: A | Discharge status: Home / Self Care | Payer: BC Managed Care – PPO | Source: Ambulatory Visit | Attending: Adult Health | Admitting: Adult Health

## 2017-03-08 DIAGNOSIS — N644 Mastodynia: Secondary | ICD-10-CM

## 2017-03-08 DIAGNOSIS — C50412 Malignant neoplasm of upper-outer quadrant of left female breast: Secondary | ICD-10-CM

## 2017-03-08 HISTORY — DX: Personal history of irradiation: Z92.3

## 2017-03-11 ENCOUNTER — Encounter: Payer: 59 | Admitting: Women's Health

## 2017-03-12 NOTE — Telephone Encounter (Signed)
ERROR

## 2017-03-14 ENCOUNTER — Telehealth: Payer: Self-pay | Admitting: *Deleted

## 2017-03-14 NOTE — Telephone Encounter (Signed)
03/14/17 at 1:51pm - The research nurse called the pt to inform her that her month 12 visit would be completed at her next visit on 04/01/17.  The pt said that she has 26 pills left in her bottle.  She said that she might cancel her 04/01/17 appointment if she is still out of town visiting her grandchildren.  She said that she would be in touch with the research nurse to schedule her study exchange visit if she cancels her April 30th appointment.  The pt was informed that she would need to return all of her study drug and her March/April medication calenders at her month 12 visit. The pt was encouraged to call Dr. Lindi Adie with any new or worsening problems.  The pt verbalized understanding.   Brion Aliment RN, BSN, CCRP Clinical Research Nurse 03/14/2017 2:05 PM

## 2017-04-01 ENCOUNTER — Other Ambulatory Visit: Payer: BC Managed Care – PPO

## 2017-04-01 ENCOUNTER — Ambulatory Visit: Payer: BC Managed Care – PPO | Admitting: Hematology and Oncology

## 2017-04-02 ENCOUNTER — Other Ambulatory Visit: Payer: BC Managed Care – PPO

## 2017-04-02 ENCOUNTER — Ambulatory Visit: Payer: BC Managed Care – PPO | Admitting: Hematology and Oncology

## 2017-04-11 ENCOUNTER — Encounter: Payer: Self-pay | Admitting: *Deleted

## 2017-04-11 ENCOUNTER — Encounter: Payer: BC Managed Care – PPO | Admitting: *Deleted

## 2017-04-11 DIAGNOSIS — C50412 Malignant neoplasm of upper-outer quadrant of left female breast: Secondary | ICD-10-CM

## 2017-04-11 MED ORDER — INV-ATORVASTATIN/PLACEBO 40 MG TABS WAKE FOREST WF 98213: 1.0000 | ORAL_TABLET | Freq: Every day | ORAL | 0 refills | Status: DC

## 2017-04-11 NOTE — Progress Notes (Signed)
04/11/17 at 2:53 pm - PREVENT/ [facility] 17919- The pt was into the cancer center for her month 12 study visit.  The pt returned her March and April 2018 medication calendars.  The pt also returned her study drug bottle with 2 pills inside. The study drug bottle was taken to the pharmacy and given to Raul Del, Careers adviser, for the drug accountability check.   The pt was given her 12 month booklet to complete.  The research nurse reviewed the pt's completed 12 month booklet for completeness and accuracy.  The pt was given medication diaries for June 2018 through December 2018.  The pt was also dispensed her new study drug bottle of atorvastatin/placebo - kit 20091- expiration date 01/30/18.  The pt was told that this bottle should last her through 10/08/17.  The pt was instructed to continue taking her study drug daily and recording her daily doses on her diaries.  The pt was given the research nurse's business card today, and the research nurse told the pt to contact her with any study questions/concerns.  The pt denied having any problems taking the study drug.  The pt reviewed her current medication list, and the pt updated the research nurse with her medication changes.  The pt also denied having any difficulty filling out the medication diaries.  The pt was told that her next on-study visit will be in November 2018 for her month 18 visit.  The pt verbalized understanding.  The pt's month 14 diaries will need to be collected in July 2018.   Brion Aliment RN, BSN, CCRP Clinical Research Nurse 04/11/2017 3:02 PM

## 2017-04-12 ENCOUNTER — Telehealth: Payer: Self-pay | Admitting: *Deleted

## 2017-04-12 NOTE — Telephone Encounter (Addendum)
[facility] faxed notice of denial for Diclofenac gel 1%. Left message informing pt and asked if she would like our office to refill the Antiinflammatory cream from [place]. Pt states she has received call from [facility] and they are sending the cream.

## 2017-04-17 ENCOUNTER — Encounter: Payer: Self-pay | Admitting: Gynecology

## 2017-04-22 ENCOUNTER — Encounter: Payer: Self-pay | Admitting: Hematology and Oncology

## 2017-04-22 ENCOUNTER — Encounter: Payer: Self-pay | Admitting: *Deleted

## 2017-04-22 ENCOUNTER — Other Ambulatory Visit (HOSPITAL_BASED_OUTPATIENT_CLINIC_OR_DEPARTMENT_OTHER): Payer: BC Managed Care – PPO

## 2017-04-22 ENCOUNTER — Ambulatory Visit (HOSPITAL_BASED_OUTPATIENT_CLINIC_OR_DEPARTMENT_OTHER): Payer: BC Managed Care – PPO | Admitting: Hematology and Oncology

## 2017-04-22 DIAGNOSIS — F418 Other specified anxiety disorders: Secondary | ICD-10-CM | POA: Diagnosis not present

## 2017-04-22 DIAGNOSIS — C773 Secondary and unspecified malignant neoplasm of axilla and upper limb lymph nodes: Secondary | ICD-10-CM | POA: Diagnosis not present

## 2017-04-22 DIAGNOSIS — R5383 Other fatigue: Secondary | ICD-10-CM

## 2017-04-22 DIAGNOSIS — C50412 Malignant neoplasm of upper-outer quadrant of left female breast: Secondary | ICD-10-CM

## 2017-04-22 LAB — CBC WITH DIFFERENTIAL/PLATELET
BASO%: 0.4 % (ref 0.0–2.0)
Basophils Absolute: 0 10*3/uL (ref 0.0–0.1)
EOS%: 3.9 % (ref 0.0–7.0)
Eosinophils Absolute: 0.2 10*3/uL (ref 0.0–0.5)
HCT: 39.6 % (ref 34.8–46.6)
HGB: 13.1 g/dL (ref 11.6–15.9)
LYMPH%: 19.5 % (ref 14.0–49.7)
MCH: 33.8 pg (ref 25.1–34.0)
MCHC: 33.1 g/dL (ref 31.5–36.0)
MCV: 102.1 fL — ABNORMAL HIGH (ref 79.5–101.0)
MONO#: 0.5 10*3/uL (ref 0.1–0.9)
MONO%: 9.7 % (ref 0.0–14.0)
NEUT#: 3.2 10*3/uL (ref 1.5–6.5)
NEUT%: 66.5 % (ref 38.4–76.8)
Platelets: 163 10*3/uL (ref 145–400)
RBC: 3.88 10*6/uL (ref 3.70–5.45)
RDW: 20.5 % — ABNORMAL HIGH (ref 11.2–14.5)
WBC: 4.8 10*3/uL (ref 3.9–10.3)
lymph#: 0.9 10*3/uL (ref 0.9–3.3)

## 2017-04-22 LAB — COMPREHENSIVE METABOLIC PANEL
ALT: 43 U/L (ref 0–55)
AST: 29 U/L (ref 5–34)
Albumin: 3.7 g/dL (ref 3.5–5.0)
Alkaline Phosphatase: 66 U/L (ref 40–150)
Anion Gap: 8 mEq/L (ref 3–11)
BUN: 12.7 mg/dL (ref 7.0–26.0)
CO2: 25 mEq/L (ref 22–29)
Calcium: 9.2 mg/dL (ref 8.4–10.4)
Chloride: 109 mEq/L (ref 98–109)
Creatinine: 0.9 mg/dL (ref 0.6–1.1)
EGFR: 66 mL/min/{1.73_m2} — ABNORMAL LOW (ref >90)
Glucose: 106 mg/dl (ref 70–140)
Potassium: 3.8 mEq/L (ref 3.5–5.1)
Sodium: 142 mEq/L (ref 136–145)
Total Bilirubin: 1 mg/dL (ref 0.20–1.20)
Total Protein: 6.7 g/dL (ref 6.4–8.3)

## 2017-04-22 MED ORDER — ESOMEPRAZOLE MAGNESIUM 20 MG PO CPDR: 20.0000 mg | DELAYED_RELEASE_CAPSULE | Freq: Every day | ORAL | Status: DC

## 2017-04-22 NOTE — Progress Notes (Signed)
Patient Care Team: [name], MD as PCP - General (Internal Medicine) [name], RN as [place] [facility]  DIAGNOSIS:  Encounter Diagnosis  Name Primary?  . 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      09/12/2016 Surgery    Left breast lumpectomy ([place]): IDC, 0.5cm, DCIS 1 cm, fibrosis, margins negative, 1/3 lymph nodes positive for metastases, triple negative, KI67 90%.        10/22/2016 - 12/11/2016 Radiation Therapy    Adjuvant radiation therapy ([place]): 1. The Left breast was treated to 50.4 Gy in 28 fractions at 1.8 Gy per fraction.  2. The Left breast was boosted to 10 Gy in 5 fractions at 2 Gy per fraction.       01/14/2017 -  Chemotherapy    Xeloda 2000 mg by mouth twice a day 2 weeks on one week off for 6 months       CHIEF COMPLIANT: Follow-up on Xeloda  INTERVAL HISTORY: Alexis Fox is a 61 year old with above-mentioned history of  triple negative breast cancer with Chemotherapy followed by lumpectomy and radiation and is currently on Xeloda therapy. She is tolerating the treatment extremely well. She denies any major problems with the diarrhea or nausea or vomiting. She denies any hand-foot syndrome related symptoms. She will finish her 6 months of therapy by July 15. She had recently been to [place] and had a very good time with her family. She continues to remain fairly anxious about her breast cancer in terms of risk of recurrence.  REVIEW OF SYSTEMS:   Constitutional: Denies fevers, chills or abnormal weight loss Eyes: Denies blurriness of vision Ears, nose, mouth, throat, and face: Denies mucositis or sore throat Respiratory: Denies cough, dyspnea or wheezes Cardiovascular: Denies palpitation, chest discomfort Gastrointestinal:  Denies nausea, heartburn or change in bowel habits Skin: Denies abnormal skin rashes Lymphatics: Denies new lymphadenopathy or easy bruising Neurological:Denies numbness, tingling or new weaknesses Behavioral/Psych: Anxiety  Extremities: No lower extremity edema All other systems were reviewed with the patient and are negative.  I have reviewed the past medical history, past surgical history, social history and family history with the patient and they are unchanged from previous note.  ALLERGIES:  is allergic to turmeric and decadron [dexamethasone].  MEDICATIONS:  Current Outpatient Prescriptions  Medication Sig Dispense Refill  . Atorvastatin Calcium (INVESTIGATIONAL ATORVASTATIN/PLACEBO) 40 MG tablet [facility] [place] [id number] Take 1 tablet by mouth daily. Take 1 tablet daily with or without food. 180 tablet 0  . baclofen (LIORESAL) 20 MG tablet     . buPROPion (WELLBUTRIN XL) 150 MG 24 hr tablet Take 150 mg by mouth daily.  4  . capecitabine (XELODA) 500 MG tablet Take 4 tablets (2,000 mg total) by mouth 2 (two) times daily after a meal. 28 tablet 0  . cetirizine (ZYRTEC ALLERGY) 10 MG  tablet Take 10 mg by mouth daily.    . citalopram (CELEXA) 40 MG tablet   3  . clonazePAM (KLONOPIN) 1 MG tablet Take 1.5 mg by mouth at bedtime.     . diclofenac sodium (VOLTAREN) 1 % GEL Apply 2 g topically 4 (four) times daily. Rub into affected area of foot 2 to 4 times daily 100 g 2  . famotidine (PEPCID) 20 MG tablet Take 20 mg by mouth 2 (two) times daily.    . fluticasone (FLONASE) 50 MCG/ACT nasal spray     . ipratropium (ATROVENT) 0.06 % nasal spray     . NONFORMULARY OR COMPOUNDED ITEM [facility]:  Antiinflammatory Cream - Diclofenac 35, Baclofen 2%, Lidocaine 2%, apply 1-2 grams to affected area 3-4 times daily. 120 each 2  . nystatin-triamcinolone ointment (MYCOLOG) Apply 1 application topically 2 (two) times daily. 60 g 3  . pramipexole (MIRAPEX) 0.5 MG tablet Take 0.5 mg by mouth at bedtime.    . RESTASIS 0.05 % ophthalmic emulsion Place 1 drop into both eyes 2 (two) times daily as needed. For dry eyes.  4  . zolpidem (AMBIEN) 5 MG tablet Take 5 mg by mouth at bedtime as needed for sleep.     No current facility-administered medications for this visit.    Facility-Administered Medications Ordered in Other Visits  Medication Dose Route Frequency Provider Last Rate Last Dose  . sodium chloride flush (NS) 0.9 % injection 10 mL  10 mL Intracatheter PRN Nicholas Lose, MD   10 mL at 07/19/16 1434    PHYSICAL EXAMINATION: ECOG PERFORMANCE STATUS: 1 - Symptomatic but completely ambulatory  Vitals:   04/22/17 1139  BP: (!) 133/56  Pulse: 73  Resp: 17  Temp: 98.6 F (37 C)   Filed Weights   04/22/17 1139  Weight: 211 lb 8 oz (95.9 kg)    GENERAL:alert, no distress and comfortable SKIN: skin color, texture, turgor are normal, no rashes or significant lesions EYES: normal, Conjunctiva are pink and non-injected, sclera clear OROPHARYNX:no exudate, no erythema and lips, buccal mucosa, and tongue normal  NECK: supple, thyroid normal size, non-tender, without  nodularity LYMPH:  no palpable lymphadenopathy in the cervical, axillary or inguinal LUNGS: clear to auscultation and percussion with normal breathing effort HEART: regular rate & rhythm and no murmurs and no lower extremity edema ABDOMEN:abdomen soft, non-tender and normal bowel sounds MUSCULOSKELETAL:no cyanosis of digits and no clubbing  NEURO: alert & oriented x 3 with fluent speech, no focal motor/sensory deficits EXTREMITIES: No lower extremity edema   LABORATORY DATA:  I have reviewed the data as listed   Chemistry      Component Value Date/Time   NA 141 02/18/2017 1327   K 3.9 02/18/2017 1327   CL 108 04/07/2016 0345   CO2 25 02/18/2017 1327   BUN 15.4 02/18/2017 1327   CREATININE 0.9 02/18/2017 1327      Component Value Date/Time   CALCIUM 9.3 02/18/2017 1327   ALKPHOS 70 02/18/2017 1327   AST 19 02/18/2017  1327   ALT 22 02/18/2017 1327   BILITOT 0.57 02/18/2017 1327       Lab Results  Component Value Date   WBC 4.8 04/22/2017   HGB 13.1 04/22/2017   HCT 39.6 04/22/2017   MCV 102.1 (H) 04/22/2017   PLT 163 04/22/2017   NEUTROABS 3.2 04/22/2017    ASSESSMENT & PLAN:  Breast cancer of upper-outer quadrant of left female breast ([place]) Mammogram 03/02/2016: Hypoechoic masses in the upper outer quadrant left breast 2 adjacent irregular masses 2.5-1.4 x 1.2 cm and the other 2.7 x 1.4 x 2.3 cm total span 6.4 cm, enlarged multiple axillary lymph nodes measuring 1.7 cm, T2 N1/N2 stage IIb vs IIIa Left breast biopsy 03/19/2016 12:30 position: Invasive ductal carcinoma with DCIS, 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  PREVENT trial: [facility] [id number]: Patient is enrolled in this trial atorvastatin versus placebo No side effects related to the study drug.  Treatment  summary:  1. Completed 4 cycles of dose dense Adriamycin and Cytoxan; and a 12 cycles ofAbraxanefrom 04/05/2016 to 08/16/2016 2. left lumpectomy 09/12/2016: IDC 0.5 cm, DCIS 1 cm, margins negative, 1/4 lymph nodes positive, T1aN1 stage II a pathologic stage 3. Adjuvant radiation 10/22/2016 to 12/11/2016 ------------------------------------------------------------------------------------------------------------------------------------ Current treatment: Adjuvant Xeloda 1500 mg twice daily 2 weeks on one week off started 01/07/2017, increasing the dose to 2000 g by mouth twice a day from 01/28/2017 (plan to treat her for 6 months)  Xeloda toxicities:Denies any nausea, diarrhea, hand-foot syndrome. Fatigue Labs were reviewed and they were normal.  Patient has profound anxiety related to her prior PTSD and having had the breast cancer. She is extremely worried about risk of recurrence. She is incapacitated when these anxiety spells happen   I recommended that the patient participate in SWOG 1418. This is a phase 3 clinical trial for triple negative breast cancer patients to have residual disease after neoadjuvant chemotherapy or lymph node involvement randomized to observation versus Pembrolizumab. I discussed the risks and benefits of immunotherapy including the risk of endocrine dysfunction including hypothyroidism, hypopituitarism as well as rash and diarrhea. She understands his risks and is interested in learning more about the clinical trial. Her clinical trials department provided her with material to look over.  Return to clinic in 6weeks for follow-up with labs  I spent 25 minutes talking to the patient of which more than half was spent in counseling and coordination of care.  No orders of the defined types were placed in this encounter.  The patient has a good understanding of the overall plan. she agrees with it. she will call with any problems that may develop before the next  visit here.   Rulon Eisenmenger, MD 04/22/17

## 2017-04-22 NOTE — Assessment & Plan Note (Signed)
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  PREVENT trial: [facility] [id number]: Patient is enrolled in this trial atorvastatin versus placebo No side effects related to the study drug.  Treatment summary:  1. Completed 4 cycles of dose dense Adriamycin and Cytoxan; and a 12 cycles ofAbraxanefrom 04/05/2016 to 08/16/2016 2. left lumpectomy 09/12/2016: IDC 0.5 cm, DCIS 1 cm, margins negative, 1/4 lymph nodes positive, T1aN1 stage II a pathologic stage 3. Adjuvant radiation 10/22/2016 to 12/11/2016 ------------------------------------------------------------------------------------------------------------------------------------ Current treatment: Adjuvant Xeloda 1500 mg twice daily 2 weeks on one week off started 01/07/2017, increasing the dose to 2000 g by mouth twice a day from 01/28/2017 (plan to treat her for 6 months)  Xeloda toxicities:Denies any nausea, diarrhea, hand-foot syndrome. Patient has diarrhea intermittently Fatigue Labs were reviewed and they were normal.  Patient has profound anxiety related to her prior PTSD and having had the breast cancer. She is extremely worried about risk of recurrence. She is incapacitated when these anxiety spells happen   Return to clinic in 6weeks for follow-up with labs

## 2017-05-01 ENCOUNTER — Telehealth: Payer: Self-pay

## 2017-05-01 NOTE — Telephone Encounter (Signed)
Per 05/01/17 email from [place] the patient would like to keep the 05/27/17 appt as she will be having cataract surg on 06/03/17.

## 2017-05-16 ENCOUNTER — Ambulatory Visit (INDEPENDENT_AMBULATORY_CARE_PROVIDER_SITE_OTHER): Payer: BC Managed Care – PPO | Admitting: Sports Medicine

## 2017-05-16 DIAGNOSIS — L600 Ingrowing nail: Secondary | ICD-10-CM

## 2017-05-16 DIAGNOSIS — M79675 Pain in left toe(s): Secondary | ICD-10-CM | POA: Diagnosis not present

## 2017-05-16 DIAGNOSIS — M79674 Pain in right toe(s): Secondary | ICD-10-CM | POA: Diagnosis not present

## 2017-05-16 NOTE — Progress Notes (Signed)
Subjective: Alexis Fox is a 61 y.o. female patient presents to office today complaining of a painful incurvated, right medial greater than left medial hallux nail. This has been present for on and off for several years. Past history 25 years ago of ingrown nail removal. However, most recently the right great toe has been giving her a lot of problems and issues. Past states that she has done nothing and try to leave her nail margins alone. Patient denies fever/chills/nausea/vomitting/any other related constitutional symptoms at this time.  Patient Active Problem List   Diagnosis Date Noted  . Central line complication 37/90/2409  . Genetic testing 05/15/2016  . Family history of breast cancer   . Family history of brain cancer   . Breast cancer of upper-outer quadrant of left female breast ([place]) 03/20/2016  . Transaminitis 10/03/2015  . Symptomatic cholelithiasis 10/03/2015  . RUQ abdominal pain   . Depression 06/24/2013  . Restless leg syndrome 06/24/2013    Current Outpatient Prescriptions on File Prior to Visit  Medication Sig Dispense Refill  . Atorvastatin Calcium (INVESTIGATIONAL ATORVASTATIN/PLACEBO) 40 MG tablet [facility] [id number] Take 1 tablet by mouth daily. Take 1 tablet daily with or without food. 180 tablet 0  . baclofen (LIORESAL) 20 MG tablet     . buPROPion (WELLBUTRIN XL) 150 MG 24 hr tablet Take 150 mg by mouth daily.  4  . capecitabine (XELODA) 500 MG tablet Take 4 tablets (2,000 mg total) by mouth 2 (two) times daily after a meal. 28 tablet 0  . cetirizine (ZYRTEC ALLERGY) 10 MG tablet Take 10 mg by mouth daily.    . citalopram (CELEXA) 40 MG tablet   3  . clonazePAM (KLONOPIN) 1 MG tablet Take 1.5 mg by mouth at bedtime.     . diclofenac sodium (VOLTAREN) 1 % GEL Apply 2 g topically 4 (four) times daily. Rub into affected area of foot 2 to 4 times daily 100 g 2  . esomeprazole (NEXIUM) 20 MG capsule Take 1 capsule (20 mg total) by mouth daily at 12 noon.    .  fluticasone (FLONASE) 50 MCG/ACT nasal spray     . NONFORMULARY OR COMPOUNDED ITEM [facility]:  Antiinflammatory Cream - Diclofenac 35, Baclofen 2%, Lidocaine 2%, apply 1-2 grams to affected area 3-4 times daily. 120 each 2  . nystatin-triamcinolone ointment (MYCOLOG) Apply 1 application topically 2 (two) times daily. 60 g 3  . pramipexole (MIRAPEX) 0.5 MG tablet Take 0.5 mg by mouth at bedtime.    . RESTASIS 0.05 % ophthalmic emulsion Place 1 drop into both eyes 2 (two) times daily as needed. For dry eyes.  4  . zolpidem (AMBIEN) 5 MG tablet Take 5 mg by mouth at bedtime as needed for sleep.     Current Facility-Administered Medications on File Prior to Visit  Medication Dose Route Frequency Provider Last Rate Last Dose  . sodium chloride flush (NS) 0.9 % injection 10 mL  10 mL Intracatheter PRN Nicholas Lose, MD   10 mL at 07/19/16 1434    Allergies  Allergen Reactions  . Turmeric Other (See Comments)    Severe diarrhea  . Decadron [Dexamethasone]     Exacerbates restless leg    Objective:  There were no vitals filed for this visit.  General: Well developed, nourished, in no acute distress, alert and oriented x3   Dermatology: Skin is warm, dry and supple bilateral. Right and left hallux nail appears to be mildly incurvated with hyperkeratosis formation at  the distal aspects of the medial nail borders. (-) Erythema. (+) Edema. (-) serosanguous  drainage present. The remaining nails appear unremarkable at this time. There are no open sores, lesions or other signs of infection present.  Vascular: Dorsalis Pedis artery and Posterior Tibial artery pedal pulses are 2/4 bilateral with immedate capillary fill time. Pedal hair growth present. No lower extremity edema.   Neruologic: Grossly intact via light touch bilateral.  Musculoskeletal: Tenderness to palpation of the right and left hallux medial nail fold(s). Muscular strength within normal limits in all groups bilateral.    Assesement and Plan: Problem List Items Addressed This Visit    None    Visit Diagnoses    Ingrown left greater toenail    -  Primary   Medial margin   Ingrown right big toenail       Medial margin   Toe pain, bilateral          -Discussed treatment alternatives and plan of care; Explained permanent/temporary nail avulsion and post procedure course to patient.Patient opt for PNA of right and left hallux medial nail margins. - After a verbal consent, injected 3 ml of a 50:50 mixture of 2% plain  lidocaine and 0.5% plain marcaine in a normal hallux block fashion. Next, a  betadine prep was performed. Anesthesia was tested and found to be appropriate.  The offending right and left hallux medial nail borders were then incised from the hyponychium to the epinychium. The offending nail borders were removed and cleared from the field. The areas were curretted for any remaining nail or spicules. Phenol application performed and the area was then flushed with alcohol and dressed with antibiotic cream and a dry sterile dressing. -Patient was instructed to leave the dressing intact for today and begin soaking  in a weak solution of betadine or Epsom salt and water tomorrow. Patient was instructed to  soak for 15 minutes each day and apply neosporin and a gauze or bandaid dressing each day. -Patient was instructed to monitor the toes for signs of infection and return to office if toe becomes red, hot or swollen. -Advised ice, elevation, and tylenol or motrin if needed for pain.  -Patient is to return in 2 weeks for follow up care/nail check or sooner if problems arise.  Landis Martins, DPM

## 2017-05-16 NOTE — Patient Instructions (Signed)

## 2017-05-22 ENCOUNTER — Other Ambulatory Visit: Payer: Self-pay | Admitting: *Deleted

## 2017-05-22 DIAGNOSIS — C50412 Malignant neoplasm of upper-outer quadrant of left female breast: Secondary | ICD-10-CM

## 2017-05-27 ENCOUNTER — Other Ambulatory Visit (HOSPITAL_BASED_OUTPATIENT_CLINIC_OR_DEPARTMENT_OTHER): Payer: BC Managed Care – PPO

## 2017-05-27 ENCOUNTER — Encounter: Payer: Self-pay | Admitting: Hematology and Oncology

## 2017-05-27 ENCOUNTER — Encounter: Payer: BC Managed Care – PPO | Admitting: *Deleted

## 2017-05-27 ENCOUNTER — Ambulatory Visit (HOSPITAL_BASED_OUTPATIENT_CLINIC_OR_DEPARTMENT_OTHER): Payer: BC Managed Care – PPO | Admitting: Hematology and Oncology

## 2017-05-27 DIAGNOSIS — Z923 Personal history of irradiation: Secondary | ICD-10-CM

## 2017-05-27 DIAGNOSIS — C50412 Malignant neoplasm of upper-outer quadrant of left female breast: Secondary | ICD-10-CM

## 2017-05-27 DIAGNOSIS — Z171 Estrogen receptor negative status [ER-]: Secondary | ICD-10-CM

## 2017-05-27 DIAGNOSIS — Z9221 Personal history of antineoplastic chemotherapy: Secondary | ICD-10-CM | POA: Diagnosis not present

## 2017-05-27 DIAGNOSIS — Z79899 Other long term (current) drug therapy: Secondary | ICD-10-CM | POA: Diagnosis not present

## 2017-05-27 LAB — COMPREHENSIVE METABOLIC PANEL
ALT: 52 U/L (ref 0–55)
AST: 28 U/L (ref 5–34)
Albumin: 3.9 g/dL (ref 3.5–5.0)
Alkaline Phosphatase: 69 U/L (ref 40–150)
Anion Gap: 8 mEq/L (ref 3–11)
BUN: 17.9 mg/dL (ref 7.0–26.0)
CO2: 28 mEq/L (ref 22–29)
Calcium: 10 mg/dL (ref 8.4–10.4)
Chloride: 104 mEq/L (ref 98–109)
Creatinine: 1 mg/dL (ref 0.6–1.1)
EGFR: 60 mL/min/{1.73_m2} — ABNORMAL LOW (ref >90)
Glucose: 92 mg/dl (ref 70–140)
Potassium: 4 mEq/L (ref 3.5–5.1)
Sodium: 139 mEq/L (ref 136–145)
Total Bilirubin: 0.85 mg/dL (ref 0.20–1.20)
Total Protein: 7.4 g/dL (ref 6.4–8.3)

## 2017-05-27 LAB — CBC WITH DIFFERENTIAL/PLATELET
BASO%: 1 % (ref 0.0–2.0)
Basophils Absolute: 0.1 10*3/uL (ref 0.0–0.1)
EOS%: 4.3 % (ref 0.0–7.0)
Eosinophils Absolute: 0.2 10*3/uL (ref 0.0–0.5)
HCT: 39.4 % (ref 34.8–46.6)
HGB: 13 g/dL (ref 11.6–15.9)
LYMPH%: 18.4 % (ref 14.0–49.7)
MCH: 35.4 pg — ABNORMAL HIGH (ref 25.1–34.0)
MCHC: 33.1 g/dL (ref 31.5–36.0)
MCV: 106.8 fL — ABNORMAL HIGH (ref 79.5–101.0)
MONO#: 0.6 10*3/uL (ref 0.1–0.9)
MONO%: 11.4 % (ref 0.0–14.0)
NEUT#: 3.6 10*3/uL (ref 1.5–6.5)
NEUT%: 64.9 % (ref 38.4–76.8)
Platelets: 186 10*3/uL (ref 145–400)
RBC: 3.68 10*6/uL — ABNORMAL LOW (ref 3.70–5.45)
RDW: 21 % — ABNORMAL HIGH (ref 11.2–14.5)
WBC: 5.5 10*3/uL (ref 3.9–10.3)
lymph#: 1 10*3/uL (ref 0.9–3.3)

## 2017-05-27 LAB — TSH: TSH: 3.084 m(IU)/L (ref 0.308–3.960)

## 2017-05-27 NOTE — Progress Notes (Signed)
Research Note for [id number];  "A Randomized, Phase III Trial to Evaluate the Efficacy and Safety of [id number] as Adjuvant Therapy for Triple Receptor-Negative Breast Cancer with > 1 cm Residual Invasive Cancer or Positive Lymph Nodes (>pN36mc) After Neoadjuvant Chemotherapy.". Accompanied patient on visit with Dr. GLindi Adieand met with her afterwards to discuss participation in the S1418 study.  Please see Consent encounter note for further details  CFoye Spurling BSN, RN Clinical Research Nurse 05/27/2017 12:48 PM

## 2017-05-27 NOTE — Assessment & Plan Note (Signed)
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  PREVENT trial: [facility] [id number]: Patient is enrolled in this trial atorvastatin versus placebo No side effects related to the study drug.  Treatment summary:  1. Completed 4 cycles of dose dense Adriamycin and Cytoxan; and a 12 cycles ofAbraxanefrom 04/05/2016 to 08/16/2016 2. left lumpectomy 09/12/2016: IDC 0.5 cm, DCIS 1 cm, margins negative, 1/4 lymph nodes positive, T1aN1 stage II a pathologic stage 3. Adjuvant radiation 10/22/2016 to 12/11/2016 4. Adjuvant Xeloda 1500 mg twice daily 2 weeks on one week off started 01/07/2017, increased the dose to 2000g by mouth twice a day from2/26/2018 completed  ------------------------------------------------------------------------------------------------------------------------------------ Treatment plan: SWOG 1418. This is a phase 3 clinical trial for triple negative breast cancer patients to have residual disease after neoadjuvant chemotherapy or lymph node involvement randomized to observation versus Pembrolizumab  Return to clinic based upon randomization

## 2017-05-27 NOTE — Progress Notes (Signed)
Patient Care Team: [name], MD as PCP - General (Internal Medicine) [name], RN as [facility]  DIAGNOSIS:  Encounter Diagnosis  Name Primary?  . 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      09/12/2016 Surgery    Left breast lumpectomy ([place]): IDC, 0.5cm, DCIS 1 cm, fibrosis, margins negative, 1/3 lymph nodes positive for metastases, triple negative, KI67 90%.        10/22/2016 - 12/11/2016 Radiation Therapy    Adjuvant radiation therapy ([place]): 1. The Left breast was treated to 50.4 Gy in 28 fractions at 1.8 Gy per fraction.  2. The Left breast was boosted to 10 Gy in 5 fractions at 2 Gy per fraction.       01/14/2017 - 05/27/2017 Chemotherapy    Xeloda 2000 mg by mouth twice a day 2 weeks on one week off for 6 months      CHIEF COMPLIANT: Patient is here to discuss enrollment in clinical trial SWOG S1418  INTERVAL HISTORY: DONNI OGLESBY is a 61 year old with above-mentioned history of triple negative left breast cancer who received neoadjuvant chemotherapy followed by lumpectomy and radiation and Xeloda Up until today. She is here today to discuss enrollment in clinical trial SWOG [phone]. She does have fatigue related to Xeloda. She did not have any diarrhea or nausea or vomiting. She was originally planning to get cataract surgery but now she has made a decision to not undergo the surgery because her vision is not yet to that point.    REVIEW OF SYSTEMS:   Constitutional: Denies fevers, chills or abnormal weight loss Eyes: Denies blurriness of vision Ears, nose, mouth, throat, and face: Denies mucositis or sore throat Respiratory: Denies cough, dyspnea or wheezes Cardiovascular: Denies palpitation, chest discomfort Gastrointestinal:  Denies nausea, heartburn or change in bowel habits Skin: Denies abnormal skin rashes Lymphatics: Denies new lymphadenopathy or easy bruising Neurological:Denies numbness, tingling or new weaknesses Behavioral/Psych: Mood is stable, no new changes  Extremities: No lower extremity edema Breast:  denies any pain or lumps or nodules in either breasts All other systems were reviewed with the patient and are negative.  I have reviewed the past medical history, past surgical history, social history and family history with the patient and they are unchanged from previous note.  ALLERGIES:  is allergic to turmeric and decadron [dexamethasone].  MEDICATIONS:  Current Outpatient Prescriptions  Medication Sig Dispense Refill  . Atorvastatin Calcium (INVESTIGATIONAL ATORVASTATIN/PLACEBO) 40 MG tablet [facility] [id number] Take 1 tablet by mouth daily. Take 1 tablet daily with or without food. 180 tablet 0  . baclofen (LIORESAL) 20 MG tablet     . buPROPion (WELLBUTRIN XL) 150 MG 24 hr tablet Take 150 mg by mouth daily.  4  . capecitabine (XELODA) 500 MG tablet Take 4 tablets (2,000 mg total) by  mouth 2 (two) times daily after a meal. 28 tablet 0  . cetirizine (ZYRTEC ALLERGY) 10 MG tablet Take 10 mg by mouth daily.    . citalopram (CELEXA) 40 MG tablet   3  . clonazePAM (KLONOPIN) 1 MG tablet Take 1.5 mg by mouth at bedtime.     . diclofenac sodium (VOLTAREN) 1 % GEL Apply 2 g topically 4 (four) times daily. Rub into affected area of foot 2 to 4 times daily 100 g 2  . esomeprazole (NEXIUM) 20 MG capsule Take 1 capsule (20 mg total) by mouth daily at 12 noon.    . fluticasone (FLONASE) 50 MCG/ACT nasal spray     . NONFORMULARY OR COMPOUNDED ITEM [facility]:  Antiinflammatory Cream - Diclofenac 35, Baclofen 2%, Lidocaine 2%, apply 1-2 grams to affected area 3-4 times daily. 120 each 2  . nystatin-triamcinolone ointment (MYCOLOG) Apply 1 application topically 2 (two) times daily. 60 g 3  . pramipexole (MIRAPEX) 0.5 MG tablet Take 0.5 mg by mouth at bedtime.    . RESTASIS 0.05 % ophthalmic emulsion Place 1 drop into both eyes 2 (two) times daily as needed. For dry eyes.  4  . zolpidem (AMBIEN) 5 MG tablet Take 5 mg by mouth at bedtime as needed for sleep.     No current facility-administered medications for this visit.    Facility-Administered Medications Ordered in Other Visits  Medication Dose Route Frequency Provider Last Rate Last Dose  . sodium chloride flush (NS) 0.9 % injection 10 mL  10 mL Intracatheter PRN Nicholas Lose, MD   10 mL at 07/19/16 1434    PHYSICAL EXAMINATION: ECOG PERFORMANCE STATUS: 1 - Symptomatic but completely ambulatory  Vitals:   05/27/17 1142  BP: 129/66  Pulse: 62  Resp: 18  Temp: 97.7 F (36.5 C)   Filed Weights   05/27/17 1142  Weight: 213 lb (96.6 kg)    GENERAL:alert, no distress and comfortable SKIN: skin color, texture, turgor are normal, no rashes or significant lesions EYES: normal, Conjunctiva are pink and non-injected, sclera clear OROPHARYNX:no exudate, no erythema and lips, buccal mucosa, and tongue normal  NECK: supple,  thyroid normal size, non-tender, without nodularity LYMPH:  no palpable lymphadenopathy in the cervical, axillary or inguinal LUNGS: clear to auscultation and percussion with normal breathing effort HEART: regular rate & rhythm and no murmurs and no lower extremity edema ABDOMEN:abdomen soft, non-tender and normal bowel sounds MUSCULOSKELETAL:no cyanosis of digits and no clubbing  NEURO: alert & oriented x 3 with fluent speech, no focal motor/sensory deficits EXTREMITIES: No lower extremity edema   LABORATORY DATA:  I have reviewed the data as listed   Chemistry      Component Value Date/Time   NA 139 05/27/2017 1126   K 4.0 05/27/2017 1126   CL 108 04/07/2016 0345   CO2 28 05/27/2017 1126   BUN 17.9 05/27/2017 1126   CREATININE 1.0 05/27/2017 1126      Component Value Date/Time   CALCIUM 10.0 05/27/2017 1126   ALKPHOS 69  05/27/2017 1126   AST 28 05/27/2017 1126   ALT 52 05/27/2017 1126   BILITOT 0.85 05/27/2017 1126       Lab Results  Component Value Date   WBC 5.5 05/27/2017   HGB 13.0 05/27/2017   HCT 39.4 05/27/2017   MCV 106.8 (H) 05/27/2017   PLT 186 05/27/2017   NEUTROABS 3.6 05/27/2017    ASSESSMENT & PLAN:  Breast cancer of upper-outer quadrant of left female breast ([place]) Mammogram 03/02/2016: Hypoechoic masses in the upper outer quadrant left breast 2 adjacent irregular masses 2.5-1.4 x 1.2 cm and the other 2.7 x 1.4 x 2.3 cm total span 6.4 cm, enlarged multiple axillary lymph nodes measuring 1.7 cm, T2 N1/N2 stage IIb vs IIIa Left breast biopsy 03/19/2016 12:30 position: Invasive ductal carcinoma with DCIS, 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  PREVENT trial: [facility] [id number]: Patient is enrolled in this trial atorvastatin versus placebo No side effects  related to the study drug.  Treatment summary:  1. Completed 4 cycles of dose dense Adriamycin and Cytoxan; and a 12 cycles ofAbraxanefrom 04/05/2016 to 08/16/2016 2. left lumpectomy 09/12/2016: IDC 0.5 cm, DCIS 1 cm, margins negative, 1/4 lymph nodes positive, T1aN1 stage II a pathologic stage 3. Adjuvant radiation 10/22/2016 to 12/11/2016 4. Adjuvant Xeloda 1500 mg twice daily 2 weeks on one week off started 01/07/2017, increased the dose to 2000g by mouth twice a day from2/26/2018 completed 05/27/2017 ------------------------------------------------------------------------------------------------------------------------------------ Treatment plan: SWOG 1418. This is a phase 3 clinical trial for triple negative breast cancer patients to have residual disease after neoadjuvant chemotherapy or lymph node involvement randomized to observation versus Pembrolizumab Patient went through the risks and benefits of the clinical trial protocol and has agreed to participate in the trial. There are a few more steps before she can get started on the randomization process.  We discussed several issues related to immunotherapy. She is very well read on immunotherapy side effects and understands it completely. Return to clinic based upon randomization  I spent 25 minutes talking to the patient of which more than half was spent in counseling and coordination of care.  No orders of the defined types were placed in this encounter.  The patient has a good understanding of the overall plan. she agrees with it. she will call with any problems that may develop before the next visit here.   Rulon Eisenmenger, MD 05/27/17

## 2017-05-27 NOTE — Addendum Note (Signed)
Addended by: Ricarda Frame C on: 05/27/2017 05:01 PM   Modules accepted: Orders

## 2017-05-31 ENCOUNTER — Ambulatory Visit: Payer: BC Managed Care – PPO | Admitting: Sports Medicine

## 2017-06-03 ENCOUNTER — Other Ambulatory Visit: Payer: BC Managed Care – PPO

## 2017-06-03 ENCOUNTER — Ambulatory Visit: Payer: BC Managed Care – PPO | Admitting: Hematology and Oncology

## 2017-06-21 ENCOUNTER — Other Ambulatory Visit: Payer: Self-pay | Admitting: Hematology and Oncology

## 2017-07-01 ENCOUNTER — Telehealth: Payer: Self-pay | Admitting: *Deleted

## 2017-07-01 MED ORDER — AMOXICILLIN-POT CLAVULANATE 875-125 MG PO TABS: 1.0000 | ORAL_TABLET | Freq: Two times a day (BID) | ORAL | 0 refills | Status: DC

## 2017-07-01 NOTE — Telephone Encounter (Signed)
Called patient who relays that she is noticing drainage.  Explained that per Dr Leeanne Rio orders continue soaking with epsom salt soaks, which will also help the 3rd toe, antibiotic ointment and bandage.  We will call in Augmentin to her pharmacy.  After completing the antibiotic call the office to schedule a follow up and we will reassess the 3rd toe and see if a procedure is needed.  Patient stated understanding.  Augmentin called in to [facility] per Dr Leeanne Rio orders.

## 2017-07-01 NOTE — Telephone Encounter (Signed)
-----   Message from Landis Martins, [place] sent at 06/28/2017 12:39 PM EDT ----- Contact: [phone] Melody Please let patient know that great toenails take longer to heal. To keep with soaking (epsom salt) and antibiotic cream. If there is any redness or drainage then we will send Augmentin to her pharmacy and then get her here next week for me to check to see how everything is healing. Soaking should help the right 3rd toe as well if bothersome then next visit can take a look and may need possibly a procedure. -Dr. Chauncey Cruel ----- Message ----- From: Manfred Shirts Sent: 06/28/2017  11:43 AM To: Melody A Judyann Munson, DPM  Patient states that the left great toenail is not healing as well as the other and her right 3rd toenail seems to be ingrown also.  What should she do?

## 2017-07-04 ENCOUNTER — Telehealth: Payer: Self-pay | Admitting: Hematology and Oncology

## 2017-07-04 NOTE — Telephone Encounter (Signed)
sw pt to confirm SCP 11/5 at 10 am per sch msg

## 2017-07-08 ENCOUNTER — Other Ambulatory Visit: Payer: Self-pay | Admitting: Hematology and Oncology

## 2017-07-11 ENCOUNTER — Other Ambulatory Visit (HOSPITAL_COMMUNITY): Payer: Self-pay | Admitting: Anesthesiology

## 2017-07-11 DIAGNOSIS — M5416 Radiculopathy, lumbar region: Secondary | ICD-10-CM

## 2017-07-18 ENCOUNTER — Telehealth: Payer: Self-pay | Admitting: *Deleted

## 2017-07-18 ENCOUNTER — Ambulatory Visit (HOSPITAL_COMMUNITY)
Admission: RE | Admit: 2017-07-18 | Discharge: 2017-07-18 | Disposition: A | Discharge status: Home / Self Care | Payer: BC Managed Care – PPO | Source: Ambulatory Visit | Attending: Anesthesiology | Admitting: Anesthesiology

## 2017-07-18 DIAGNOSIS — M5416 Radiculopathy, lumbar region: Secondary | ICD-10-CM | POA: Diagnosis not present

## 2017-07-18 NOTE — Telephone Encounter (Signed)
Received call from patient complaining of fatigue and memory issues still from her treatment.  She wants to discuss with Dr. Lindi Adie about not returning to work.   She feels she is unable at to perform her duties at work due to her fatigue and her chemo brain.  Confirmed apt for 8/23 at 2pm

## 2017-07-23 ENCOUNTER — Telehealth: Payer: Self-pay

## 2017-07-23 NOTE — Telephone Encounter (Addendum)
Received call from [facility], [place] disability [phone]). 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 Lisa,RN received disability form, along with latest progress notes and unable to find a substantial reason for pt not coming back to work. Called Lisa and LVM, that pt is coming in to see Dr.Gudena to discuss this concern this week, and will fax her latest progress note for that visit. Previous call from pt stating that she has memory issues and deals with fatigue most of the time and unable to work anymore. Left call back number to discuss further.

## 2017-07-24 ENCOUNTER — Encounter: Payer: Self-pay | Admitting: Hematology and Oncology

## 2017-07-24 ENCOUNTER — Encounter: Payer: Self-pay | Admitting: *Deleted

## 2017-07-24 ENCOUNTER — Ambulatory Visit: Payer: BC Managed Care – PPO | Admitting: Hematology and Oncology

## 2017-07-24 ENCOUNTER — Ambulatory Visit (HOSPITAL_BASED_OUTPATIENT_CLINIC_OR_DEPARTMENT_OTHER): Payer: BC Managed Care – PPO | Admitting: Hematology and Oncology

## 2017-07-24 ENCOUNTER — Encounter (INDEPENDENT_AMBULATORY_CARE_PROVIDER_SITE_OTHER): Payer: Self-pay

## 2017-07-24 DIAGNOSIS — C50412 Malignant neoplasm of upper-outer quadrant of left female breast: Secondary | ICD-10-CM

## 2017-07-24 DIAGNOSIS — Z171 Estrogen receptor negative status [ER-]: Secondary | ICD-10-CM

## 2017-07-24 NOTE — Assessment & Plan Note (Signed)
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  PREVENT trial: [facility] [id number]: Patient is enrolled in this trial atorvastatin versus placebo No side effects related to the study drug.  Treatment summary:  1. Completed 4 cycles of dose dense Adriamycin and Cytoxan; and a 12 cycles ofAbraxanefrom 04/05/2016 to 08/16/2016 2. left lumpectomy 09/12/2016: IDC 0.5 cm, DCIS 1 cm, margins negative, 1/4 lymph nodes positive, T1aN1 stage II a pathologic stage 3. Adjuvant radiation 10/22/2016 to 12/11/2016 4. Adjuvant Xeloda 1500 mg twice daily 2 weeks on one week off started 01/07/2017, increased the dose to 2000g by mouth twice a day from2/26/2018 completed 05/27/2017 Patient refused participation in SWOG 1418 ------------------------------------------------------------------------------------------------------------------------------------ Postchemotherapy complaints 1. Chemo brain 2. severe fatigue Both of these symptoms making it difficult for her to go back to work. Because of this a support her decision of not returning back to work.

## 2017-07-24 NOTE — Progress Notes (Signed)
Patient Care Team: Kirby Funk, MD as PCP - General (Internal Medicine) Shelda Pal, RN as [facility] Care Management  DIAGNOSIS:  Encounter Diagnosis  Name Primary?  . Breast cancer of upper-outer quadrant of left female breast (HCC)     SUMMARY OF ONCOLOGIC HISTORY:   Breast cancer of upper-outer quadrant of left female breast (HCC)   03/02/2016 Mammogram    Hypoechoic masses in the upper outer quadrant left breast 2 adjacent irregular masses 2.5-1.4 x 1.2 cm and the other 2.7 x 1.4 x 2.3 cm total span 6.4 cm, enlarged multiple axillary lymph nodes measuring 1.7 cm, T2 N1/N2 stage IIb vs IIIa      03/19/2016 Initial Diagnosis    Left breast biopsy 12:30 position: Invasive ductal carcinoma with DCIS, 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      09/12/2016 Surgery    Left breast lumpectomy ([facility]): IDC, 0.5cm, DCIS 1 cm, fibrosis, margins negative, 1/3 lymph nodes positive for metastases, triple negative, KI67 90%.        10/22/2016 - 12/11/2016 Radiation Therapy    Adjuvant radiation therapy ([facility]): 1. The Left breast was treated to 50.4 Gy in 28 fractions at 1.8 Gy per fraction.  2. The Left breast was boosted to 10 Gy in 5 fractions at 2 Gy per fraction.       01/14/2017 - 05/27/2017 Chemotherapy    Xeloda 2000 mg by mouth twice a day 2 weeks on one week off for 6 months       CHIEF COMPLIANT: Profound difficulties with fatigue, mental clouding, memory loss, chemotherapy  brain  INTERVAL HISTORY: Alexis Fox is a 61 year old with above-mentioned history of triple negative breast cancer who is currently on surveillance. She came in for an urgent visit because she is having lots of sequelae from prior chemotherapy including severe fatigue which requires her to take multiple breaks and naps throughout the day. She is having cloudiness in the head and difficulty with thinking and cognitive function. 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 She is also depressed from most of these symptoms is not going away fast enough. She has not been able to exercise because of fatigue issues.  REVIEW OF SYSTEMS:   Constitutional: Denies fevers, chills or abnormal weight loss Eyes: Denies blurriness of vision Ears, nose, mouth, throat, and face: Denies mucositis or sore throat Respiratory: Denies cough, dyspnea or wheezes Cardiovascular: Denies palpitation, chest discomfort Gastrointestinal:  Denies nausea, heartburn or change in bowel habits Skin: Denies abnormal skin rashes Lymphatics: Denies new lymphadenopathy or easy bruising Neurological:Denies numbness, tingling or new weaknesses Behavioral/Psych: Cognitive dysfunction, memory impairment, fatigue, depression Extremities: No lower extremity edema All other systems were reviewed with the patient and are negative.  I have reviewed the past medical history, past surgical history, social history and family history with the patient and they are unchanged from previous note.  ALLERGIES:  is allergic to turmeric and decadron [dexamethasone].  MEDICATIONS:  Current Outpatient Prescriptions  Medication Sig Dispense Refill  . amoxicillin-clavulanate (AUGMENTIN) 875-125 MG tablet Take 1 tablet by mouth 2 (two) times daily. 20 tablet 0  . Atorvastatin Calcium (INVESTIGATIONAL ATORVASTATIN/PLACEBO) 40 MG tablet [facility] [id number] Take 1 tablet by mouth daily. Take 1 tablet daily with or without food. 180 tablet 0  .  buPROPion (WELLBUTRIN XL) 150 MG 24 hr tablet Take 150 mg by mouth daily.  4  . capecitabine (XELODA) 500 MG tablet Take 4 tablets (2,000 mg total) by mouth 2 (two) times daily after a meal. 28 tablet 0  . cetirizine (ZYRTEC ALLERGY) 10 MG tablet Take 10 mg by mouth daily.    . citalopram (CELEXA) 40 MG tablet   3  . clonazePAM (KLONOPIN) 1 MG tablet Take 1.5 mg by mouth at bedtime.     . diclofenac sodium (VOLTAREN) 1 % GEL Apply 2 g topically 4 (four) times daily. Rub into affected area of foot 2 to 4 times daily 100 g 2  . esomeprazole (NEXIUM) 20 MG capsule Take 1 capsule (20 mg total) by mouth daily at 12 noon.    . fluticasone (FLONASE) 50 MCG/ACT nasal spray     . NONFORMULARY OR COMPOUNDED ITEM [facility]:  Antiinflammatory Cream - Diclofenac 35, Baclofen 2%, Lidocaine 2%, apply 1-2 grams to affected area 3-4 times daily. 120 each 2  . nystatin-triamcinolone ointment (MYCOLOG) Apply 1 application topically 2 (two) times daily. 60 g 3  . pramipexole (MIRAPEX) 0.5 MG tablet Take 0.5 mg by mouth at bedtime.    . RESTASIS 0.05 % ophthalmic emulsion Place 1 drop into both eyes 2 (two) times daily as needed. For dry eyes.  4  . zolpidem (AMBIEN) 5 MG tablet Take 5 mg by mouth at bedtime as needed for sleep.     No current facility-administered medications for this visit.    Facility-Administered Medications Ordered in Other Visits  Medication Dose Route Frequency Provider Last Rate Last Dose  . sodium chloride flush (NS) 0.9 % injection 10 mL  10 mL Intracatheter PRN Nicholas Lose, MD   10 mL at 07/19/16 1434    PHYSICAL EXAMINATION: ECOG PERFORMANCE STATUS: 1 - Symptomatic but completely ambulatory  Vitals:   07/24/17 1219  BP: 127/75  Pulse: 68  Resp: 18  Temp: 98.4 F (36.9 C)  SpO2: 98%   Filed Weights   07/24/17 1219  Weight: 214 lb 1.6 oz (97.1 kg)    GENERAL:alert, no distress and comfortable SKIN: skin color, texture, turgor are normal, no rashes or significant  lesions EYES: normal, Conjunctiva are pink and non-injected, sclera clear OROPHARYNX:no exudate, no erythema and lips, buccal mucosa, and tongue normal  NECK: supple, thyroid normal size, non-tender, without nodularity LYMPH:  no palpable lymphadenopathy in the cervical, axillary or inguinal LUNGS: clear to auscultation and percussion with normal breathing effort HEART: regular rate & rhythm and no murmurs and no lower extremity edema ABDOMEN:abdomen soft, non-tender and normal bowel sounds MUSCULOSKELETAL:no cyanosis of digits and no clubbing  NEURO: alert & oriented x 3 with fluent speech, no focal motor/sensory deficits EXTREMITIES: No lower extremity edema  LABORATORY DATA:  I have reviewed the data as listed   Chemistry      Component Value Date/Time   NA 139 05/27/2017 1126   K 4.0 05/27/2017 1126   CL 108 04/07/2016 0345   CO2 28 05/27/2017 1126   BUN 17.9 05/27/2017 1126   CREATININE 1.0 05/27/2017 1126      Component Value Date/Time  CALCIUM 10.0 05/27/2017 1126   ALKPHOS 69 05/27/2017 1126   AST 28 05/27/2017 1126   ALT 52 05/27/2017 1126   BILITOT 0.85 05/27/2017 1126       Lab Results  Component Value Date   WBC 5.5 05/27/2017   HGB 13.0 05/27/2017   HCT 39.4 05/27/2017   MCV 106.8 (H) 05/27/2017   PLT 186 05/27/2017   NEUTROABS 3.6 05/27/2017    ASSESSMENT & PLAN:  Breast cancer of upper-outer quadrant of left female breast ([place]) Mammogram 03/02/2016: Hypoechoic masses in the upper outer quadrant left breast 2 adjacent irregular masses 2.5-1.4 x 1.2 cm and the other 2.7 x 1.4 x 2.3 cm total span 6.4 cm, enlarged multiple axillary lymph nodes measuring 1.7 cm, T2 N1/N2 stage IIb vs IIIa Left breast biopsy 03/19/2016 12:30 position: Invasive ductal carcinoma with DCIS, 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  PREVENT trial: [facility] [id number]: Patient is enrolled in this trial atorvastatin versus placebo No side effects related to the study drug.  Treatment summary:  1. Completed 4 cycles of dose dense Adriamycin and Cytoxan; and a 12 cycles ofAbraxanefrom 04/05/2016 to 08/16/2016 2. left lumpectomy 09/12/2016: IDC 0.5 cm, DCIS 1 cm, margins negative, 1/4 lymph nodes positive, T1aN1 stage II a pathologic stage 3. Adjuvant radiation 10/22/2016 to 12/11/2016 4. Adjuvant Xeloda 1500 mg twice daily 2 weeks on one week off started 01/07/2017, increased the dose to 2000g by mouth twice a day from2/26/2018 completed 05/27/2017 Patient refused participation in SWOG 1418 ------------------------------------------------------------------------------------------------------------------------------------ Postchemotherapy complaints 1. Chemo brain 2. severe fatigue Both of these symptoms making it difficult for her to go back to work. Because of this a support her decision of not returning back to work.   I support her decision to apply for permanent disability. I discussed with her multiple things that could improve her mental function and cognitive difficulties. She is motivated to participate in physical exercise and has enrolled in the live strong program and is awaiting to start on that.  I spent 25 minutes talking to the patient of which more than half was spent in counseling and coordination of care.  No orders of the defined types were placed in this encounter.  The patient has a good understanding of the overall plan. she agrees with it. she will call with any problems that may develop before the next visit here.   Rulon Eisenmenger, MD 07/24/17

## 2017-07-24 NOTE — Progress Notes (Signed)
07/24/17 at 4:18pm - The pt was into to see Dr. Lindi Adie this afternoon for some ongoing fatigue, mental clouding, and memory loss.  The pt was seen and examined by Dr. Lindi Adie, and he felt that all of the pt's ongoing AE's were related to her prior chemo administration.  The research nurse talked with Dr. Lindi Adie and asked him if he felt that her AE's were "possibly" related to her study drug (atorvastatin/placebo).  Dr. Lindi Adie said he did not feel that they were related to her study drug.  He did not want the pt to stop her study drug.  The research nurse thanked the MD for his attribution.   Brion Aliment RN, BSN, CCRP Clinical Research Nurse 07/24/2017 4:24 PM

## 2017-07-25 ENCOUNTER — Ambulatory Visit: Payer: BC Managed Care – PPO | Admitting: Hematology and Oncology

## 2017-07-29 ENCOUNTER — Ambulatory Visit: Payer: BC Managed Care – PPO | Admitting: Hematology and Oncology

## 2017-07-29 ENCOUNTER — Other Ambulatory Visit: Payer: BC Managed Care – PPO

## 2017-08-07 ENCOUNTER — Telehealth: Payer: Self-pay

## 2017-08-07 NOTE — Telephone Encounter (Signed)
Spoke with patient around 1:22 today regarding mailing in her August Prevent calendar and to bring her Sept/Oct calendars to her November appt. She states that she may not know where they are placed at this time.  I will be mailing her August-October calendars along with a self addressed envelope along with a print out of her upcoming appts.

## 2017-10-04 ENCOUNTER — Telehealth: Payer: Self-pay

## 2017-10-04 NOTE — Telephone Encounter (Signed)
Called patient with a reminder of her Monday 11/5 appt.  She was asked to bring in her study drug bottle and sept/oct calendars.

## 2017-10-07 ENCOUNTER — Encounter: Payer: Self-pay | Admitting: *Deleted

## 2017-10-07 ENCOUNTER — Encounter: Payer: Self-pay | Admitting: Hematology and Oncology

## 2017-10-07 ENCOUNTER — Ambulatory Visit (HOSPITAL_BASED_OUTPATIENT_CLINIC_OR_DEPARTMENT_OTHER): Payer: BC Managed Care – PPO | Admitting: Adult Health

## 2017-10-07 VITALS — BP 130/55 | HR 91 | Temp 98.2°F | Resp 18 | Ht 64.0 in | Wt 219.0 lb

## 2017-10-07 DIAGNOSIS — Z9221 Personal history of antineoplastic chemotherapy: Secondary | ICD-10-CM | POA: Diagnosis not present

## 2017-10-07 DIAGNOSIS — Z923 Personal history of irradiation: Secondary | ICD-10-CM

## 2017-10-07 DIAGNOSIS — Z006 Encounter for examination for normal comparison and control in clinical research program: Secondary | ICD-10-CM | POA: Diagnosis not present

## 2017-10-07 DIAGNOSIS — C50412 Malignant neoplasm of upper-outer quadrant of left female breast: Secondary | ICD-10-CM

## 2017-10-07 DIAGNOSIS — Z171 Estrogen receptor negative status [ER-]: Secondary | ICD-10-CM

## 2017-10-07 MED ORDER — INV-ATORVASTATIN/PLACEBO 40 MG TABS WAKE FOREST WF 98213: 1.0000 | ORAL_TABLET | Freq: Every day | ORAL | 0 refills | Status: DC

## 2017-10-07 NOTE — Progress Notes (Signed)
CLINIC:  Survivorship   REASON FOR VISIT:  Routine follow-up post-treatment for a recent history of breast cancer.  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      09/12/2016 Surgery    Left breast lumpectomy ([facility]): IDC, 0.5cm, DCIS 1 cm, fibrosis, margins negative, 1/3 lymph nodes positive for metastases, triple negative, KI67 90%.        10/22/2016 - 12/11/2016 Radiation Therapy    Adjuvant radiation therapy ([facility]): 1. The Left breast was treated to 50.4 Gy in 28 fractions at 1.8 Gy per fraction.  2. The Left breast was boosted to 10 Gy in 5 fractions at 2 Gy per fraction.       01/14/2017 - 05/27/2017 Chemotherapy    Xeloda 2000 mg by mouth twice a day 2 weeks on one week off for 6 months       INTERVAL HISTORY:  Ms. Leaver presents to the [facility] today for our initial meeting to review her survivorship care plan detailing her treatment course for breast cancer, as well as monitoring long-term side effects of that treatment, education  regarding health maintenance, screening, and overall wellness and health promotion.     Overall, Ms. Conover reports feeling quite well.  She is on PREVENT trial, and has no issues or concerns with the medication that she is taking.  She does have an upper respiratory infection, however it is mainly in her sinuses, not in her chest.  She is not coughing.  This has been going on for four weeks.    Chrissy has chronic back pain and sees pain management for this.  She is due for an injection in the next week or so.  She has had an occasional twinge of discomfort in her right breast and it is more of a burning/tingling sensation.  This is in the upper outer quadrant of the breast (original breast cancer in the left side).  No skin or nipple changes to the breast.  No discharge.    REVIEW OF SYSTEMS:  Review of Systems  Constitutional: Negative for appetite change, chills, fatigue, fever and unexpected weight change.  HENT:   Negative for hearing loss and lump/mass.   Eyes: Negative for eye problems.  Respiratory: Negative for chest tightness, cough and shortness of breath.   Cardiovascular: Negative for leg swelling and palpitations.  Gastrointestinal: Negative for abdominal distention, abdominal pain, constipation, diarrhea, nausea and vomiting.  Endocrine: Negative for hot flashes.  Genitourinary: Negative for difficulty  urinating.   Musculoskeletal: Positive for arthralgias.  Skin: Negative for itching and rash.  Neurological: Negative for dizziness, extremity weakness, headaches and numbness.  Hematological: Negative for adenopathy. Does not bruise/bleed easily.  Psychiatric/Behavioral: Negative for depression. The patient is not nervous/anxious.         ONCOLOGY TREATMENT TEAM:  1. Surgeon:  Dr. Chrissie Noa at [facility] 2. Medical Oncologist: Dr. Lindi Adie  3. Radiation Oncologist: Dr. Tammi Klippel    PAST MEDICAL/SURGICAL HISTORY:  Past Medical History:  Diagnosis Date  .  Anxiety   . Arthritis    knees  . Breast cancer ([place])   . Breast cancer of upper-outer quadrant of left female breast ([place]) 03/20/2016  . Cholecystitis   . Complication of anesthesia    BP "crashes" post op  . Depression   . Family history of brain cancer   . Family history of breast cancer   . Hot flashes   . Personal history of radiation therapy   . Restless leg syndrome    Past Surgical History:  Procedure Laterality Date  . BREAST BIOPSY    . BREAST LUMPECTOMY    . BUNIONECTOMY    . ELBOW SURGERY     right for epicondylitis 2010   . FOOT SURGERY     1983 -tarsal tunnel release  . IR GENERIC HISTORICAL  10/26/2016   IR CV LINE INJECTION 10/26/2016 [facility]-INTERV RAD  . LUMBAR DISC SURGERY  04/04/2012  . SPINAL FUSION  5/12   L5-S1  . TARSAL TUNNEL RELEASE       ALLERGIES:  Allergies  Allergen Reactions  . Turmeric Other (See Comments)    Severe diarrhea  . Decadron [Dexamethasone]     Exacerbates restless leg     CURRENT MEDICATIONS:  Outpatient Encounter Medications as of 10/07/2017  Medication Sig  . amoxicillin-clavulanate (AUGMENTIN) 875-125 MG tablet Take 1 tablet by mouth 2 (two) times daily.  . Atorvastatin Calcium (INVESTIGATIONAL ATORVASTATIN/PLACEBO) 40 MG tablet [facility] [phone] Take 1 tablet by mouth daily. Take 1 tablet daily with or without food.  Marland Kitchen buPROPion (WELLBUTRIN XL) 150 MG 24 hr tablet Take 150 mg by mouth daily.  . capecitabine (XELODA) 500 MG tablet Take 4 tablets (2,000 mg total) by mouth 2 (two) times daily after a meal.  . cetirizine (ZYRTEC ALLERGY) 10 MG tablet Take 10 mg by mouth daily.  . citalopram (CELEXA) 40 MG tablet   . clonazePAM (KLONOPIN) 1 MG tablet Take 1.5 mg by mouth at bedtime.   . diclofenac sodium (VOLTAREN) 1 % GEL Apply 2 g topically 4 (four) times daily. Rub into affected area of foot 2 to 4 times daily  . esomeprazole (NEXIUM) 20 MG capsule Take 1 capsule (20 mg total) by mouth daily at 12 noon.  . fluticasone  (FLONASE) 50 MCG/ACT nasal spray   . NONFORMULARY OR COMPOUNDED ITEM [facility]:  Antiinflammatory Cream - Diclofenac 35, Baclofen 2%, Lidocaine 2%, apply 1-2 grams to affected area 3-4 times daily.  Marland Kitchen nystatin-triamcinolone ointment (MYCOLOG) Apply 1 application topically 2 (two) times daily.  . pramipexole (MIRAPEX) 0.5 MG tablet Take 0.5 mg by mouth at bedtime.  . RESTASIS 0.05 % ophthalmic emulsion Place 1 drop into both eyes 2 (two) times daily as needed. For dry eyes.  Marland Kitchen zolpidem (AMBIEN) 5 MG tablet Take 5 mg by mouth at bedtime as needed for sleep.   Facility-Administered Encounter Medications as of 10/07/2017  Medication  . sodium chloride flush (NS) 0.9 % injection 10  mL     ONCOLOGIC FAMILY HISTORY:  Family History  Problem Relation Age of Onset  . Heart disease Mother   . Pulmonary fibrosis Mother   . Hypertension Mother   . Cancer Father 22       astocytoma  . Hypertension Brother   . Lymphoma Maternal Aunt   . Anesthesia problems Neg Hx       SOCIAL HISTORY:  ALENAH SARRIA is married and lives with her husband along with her son and his family in [place], [place].  She has a daughter also who lives in [place].  Ms. Matthies is currently on disability.  She denies any current or history of tobacco, alcohol, or illicit drug use.     PHYSICAL EXAMINATION:  Vital Signs:   Vitals:   10/07/17 1026  BP: (!) 130/55  Pulse: 91  Resp: 18  Temp: 98.2 F (36.8 C)  SpO2: 99%   Filed Weights   10/07/17 1026  Weight: 219 lb (99.3 kg)   General: Well-nourished, well-appearing female in no acute distress.  She is accompanied in clinic by her husbandtoday.   HEENT: Head is normocephalic.  Pupils equal and reactive to light. Conjunctivae clear without exudate.  Sclerae anicteric. Oral mucosa is pink, moist.  Oropharynx is pink without lesions or erythema.  Lymph: No cervical, supraclavicular, or infraclavicular lymphadenopathy noted on palpation.    Cardiovascular: Regular rate and rhythm.Marland Kitchen Respiratory: Clear to auscultation bilaterally. Chest expansion symmetric; breathing non-labored.  Breast: left breast s/p lumpectomy, site is well healed with mild amt of scar tissue, right breast without nodules, masses, skin or nipple changes, benign bilateral breast exam GI: Abdomen soft and round; non-tender, non-distended. Bowel sounds normoactive.  GU: Deferred.  Neuro: No focal deficits. Steady gait.  Psych: Mood and affect normal and appropriate for situation.  Extremities: No edema. MSK: No focal spinal tenderness to palpation.  Full range of motion in bilateral upper extremities Skin: Warm and dry.  LABORATORY DATA:  None for this visit.  DIAGNOSTIC IMAGING:  None for this visit.      ASSESSMENT AND PLAN:  Ms.. Colclasure is a pleasant 61 y.o. 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  She presents to the Survivorship Clinic for our initial meeting and routine follow-up post-completion of treatment for breast cancer.    1. Stage IIIA left breast cancer:  Ms. Kneip is continuing to recover from definitive treatment for breast cancer. She will follow-up with her medical oncologist, Dr. Cleda Daub 01/2018 with history and physical exam per surveillance protocol. Today, a comprehensive survivorship care plan and treatment summary was reviewed with the patient today detailing her breast cancer diagnosis, treatment course, potential late/long-term effects of treatment, appropriate follow-up care with recommendations for the future, and patient education resources.  A copy of this summary, along with a letter will be sent to the patient's primary care provider via mail/fax/In Basket message after today's visit.    2. PREVENT trial: She is tolerating this well,  with no issues today  3. Bone health:  Given Ms. Theurer's age/history of breast cancer, she is at risk for bone demineralization.  I will defer to her PCP regarding bone density testing and management.  In the meantime, she was encouraged to increase her consumption of foods rich in calcium, as well as increase her weight-bearing  activities.  She was given education on specific activities to promote bone health.  4. Cancer screening:  Due to Ms. Runnels's history and her age, she should receive screening for skin cancers, colon cancer, and gynecologic cancers.  The information and recommendations are listed on the patient's comprehensive care plan/treatment summary and were reviewed in detail with the patient.    5. Health maintenance and wellness promotion: Ms. Rollins was encouraged to consume 5-7 servings of fruits and vegetables per day. We reviewed the "Nutrition Rainbow" handout, as well as the handout "Take Control of Your Health and Reduce Your Cancer Risk" from the [place].  She was also encouraged to engage in moderate to vigorous exercise for 30 minutes per day most days of the week. We discussed the [employer] fitness program, which is designed for cancer survivors to help them become more physically fit after cancer treatments.  She was instructed to limit her alcohol consumption and continue to abstain from tobacco use.     6. Support services/counseling: It is not uncommon for this period of the patient's cancer care trajectory to be one of many emotions and stressors.  We discussed an opportunity for her to participate in the next session of [facility] ("Finding Your New Normal") support group series designed for patients after they have completed treatment.   Ms. Hendricksen was encouraged to take advantage of our many other support services programs, support groups, and/or counseling in coping with her new life as a cancer survivor after completing anti-cancer treatment.  She was  offered support today through active listening and expressive supportive counseling.  She was given information regarding our available services and encouraged to contact me with any questions or for help enrolling in any of our support group/programs.    Dispo:   -Return to cancer center 01/23/2018 for follow up with Dr. Lindi Adie -Mammogram due in 03/2018 -Follow up with surgery 07/2018 -She is welcome to return back to the Survivorship Clinic at any time; no additional follow-up needed at this time.  -Consider referral back to survivorship as a long-term survivor for continued surveillance  A total of (30) minutes of face-to-face time was spent with this patient with greater than 50% of that time in counseling and care-coordination.   Gardenia Phlegm, [place] [phone]   Note: PRIMARY CARE PROVIDER [name], [place] [phone]

## 2017-10-07 NOTE — Progress Notes (Signed)
10/07/17 at 11:38am - [place]- PREVENT - month 18 study notes- The pt was into the cancer center this morning for her survivorship clinic visit.  The pt was given her month 18 booklet to complete.  The research nurse reviewed the pt's completed booklet for accuracy and completeness.  The pt was seen and examined today by Gardenia Phlegm, NP.  The pt forgot her study drug bottle.  She has a meeting here this evening, and she said she would bring it back tonight for the drug accountability check.   The pt's husband states that it has about "5 pills inside".  The pt also lost her September and October medication diaries.  The research nurse provided the pt with new medication diaries, and the pt completed her September and October diaries in the clinic.  She said that she only missed 1 pill in the last 2 months.  The pt denied "having any problems taking the study drug (atorvastatin/placebo).  She also denied "having any difficulty filling out the medication diary".  The pt denied any adverse events associated with her study drug.  The pt's current medications were reviewed with the pt.  The pt said that she stopped her Xeloda in July 2018 (date unknown by the pt and her husband).  The pt was dispensed her last bottle of study drug today for self administration.  The pt was told that her last dose of study drug should be on 04/04/18.  The pt started her study drug on 04/04/16.  The pt's study drug bottle will expire on 04/25/18.  The pt was told that her month 24 study assessments will need to be done prior to her last dose of study drug.  The pt was told that the research nurse will contact her in the spring to schedule her month 24 appointments.  The pt was thanked for her continued support and compliance with the trial.   Brion Aliment RN, BSN, [facility] Nurse 10/07/2017 11:51 AM   10/07/17 at 3:01pm - [facility] [address]-PREVENT- The pt returned her study drug bottle with 5 remaining pills  inside. The study drug bottle was taken to the pharmacy for the drug accountability check.   Brion Aliment RN, BSN,CCRP  Clinical Research Nurse 10/09/2017 3:02 PM

## 2017-10-08 ENCOUNTER — Telehealth: Payer: Self-pay | Admitting: Adult Health

## 2017-10-08 NOTE — Telephone Encounter (Signed)
No 11/5 los.  °

## 2017-10-11 ENCOUNTER — Telehealth: Payer: Self-pay | Admitting: Adult Health

## 2017-10-11 NOTE — Telephone Encounter (Signed)
No 11/5 los.  °

## 2017-12-06 ENCOUNTER — Telehealth: Payer: Self-pay

## 2017-12-06 NOTE — Telephone Encounter (Signed)
Spoke with patient per Lexine Baton and she will be mailing in her November and December 2018 PREVENT diaries.

## 2017-12-25 ENCOUNTER — Encounter: Payer: BC Managed Care – PPO | Admitting: Women's Health

## 2017-12-27 ENCOUNTER — Telehealth: Payer: Self-pay

## 2017-12-27 ENCOUNTER — Other Ambulatory Visit: Payer: Self-pay

## 2017-12-27 DIAGNOSIS — C50412 Malignant neoplasm of upper-outer quadrant of left female breast: Secondary | ICD-10-CM

## 2017-12-27 NOTE — Telephone Encounter (Signed)
Pt called to report that she had been experiencing pain and fullness on her RUQ area, that is a new symptom.The pain is intermittent that happens 3-4x per day. Also, her R breast and axilla is feeling sore, full and painful which is new as well. Pt denies any redness, fever, chills, n/v, or lumps. Pt has a hx of left lumpectomy and radiation. Currently on clinical trial.   Pt would like to see if she can come and see Dr.Gudena sooner than end of feb for her follow up appt. Told pt Dr.Gudena is out of the office and will discuss symptoms with Lindsey,NP.  Obtained order for liver US, and MM diagnostic of R breast with [place] at the breast center. Will schedule pt for these test and move up pt appt with Dr.Gudena. Will call pt with updates. Pt verbalized understanding and appreciative of call.     9- Called pt with confirmed time/date for US liver and mm/us r breast. Will schedule pt for follow up with Dr.Gudena with labs next week to discuss results.

## 2018-01-01 ENCOUNTER — Ambulatory Visit
Admission: RE | Admit: 2018-01-01 | Discharge: 2018-01-01 | Disposition: A | Discharge status: Home / Self Care | Payer: BC Managed Care – PPO | Source: Ambulatory Visit | Attending: Adult Health | Admitting: Adult Health

## 2018-01-01 ENCOUNTER — Ambulatory Visit (HOSPITAL_COMMUNITY): Payer: BC Managed Care – PPO

## 2018-01-01 DIAGNOSIS — C50412 Malignant neoplasm of upper-outer quadrant of left female breast: Secondary | ICD-10-CM

## 2018-01-01 NOTE — Assessment & Plan Note (Signed)
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  PREVENT trial: [facility] [id number]: Patient is enrolled in this trial atorvastatin versus placebo No side effects related to the study drug.  Treatment summary:  1. Completed 4 cycles of dose dense Adriamycin and Cytoxan; and a 12 cycles ofAbraxanefrom 04/05/2016 to 08/16/2016 2. left lumpectomy 09/12/2016: IDC 0.5 cm, DCIS 1 cm, margins negative, 1/4 lymph nodes positive, T1aN1 stage II a pathologic stage 3. Adjuvant radiation 10/22/2016 to 12/11/2016 4. Adjuvant Xeloda 1500 mg twice daily 2 weeks on one week off started 01/07/2017, increasedthe dose to 2000g by mouth twice a day from2/26/2018 completed 05/27/2017 Patient refused participation in SWOG 1418 ------------------------------------------------------------------------------------------------------------------------------------ Surveillance: 1. Breast Exam 2. Mammogram  RTC in 1 year

## 2018-01-02 ENCOUNTER — Inpatient Hospital Stay: Payer: BC Managed Care – PPO | Attending: Hematology and Oncology | Admitting: Hematology and Oncology

## 2018-01-02 ENCOUNTER — Telehealth: Payer: Self-pay | Admitting: Hematology and Oncology

## 2018-01-02 ENCOUNTER — Inpatient Hospital Stay: Payer: BC Managed Care – PPO

## 2018-01-02 DIAGNOSIS — C50412 Malignant neoplasm of upper-outer quadrant of left female breast: Secondary | ICD-10-CM

## 2018-01-02 LAB — COMPREHENSIVE METABOLIC PANEL
ALT: 17 U/L (ref 0–55)
AST: 15 U/L (ref 5–34)
Albumin: 3.5 g/dL (ref 3.5–5.0)
Alkaline Phosphatase: 73 U/L (ref 40–150)
Anion gap: 7 (ref 3–11)
BUN: 15 mg/dL (ref 7–26)
CO2: 28 mmol/L (ref 22–29)
Calcium: 9.5 mg/dL (ref 8.4–10.4)
Chloride: 105 mmol/L (ref 98–109)
Creatinine, Ser: 0.91 mg/dL (ref 0.60–1.10)
GFR calc Af Amer: >60 mL/min (ref >60)
GFR calc non Af Amer: >60 mL/min (ref >60)
Glucose, Bld: 84 mg/dL (ref 70–140)
Potassium: 4.3 mmol/L (ref 3.5–5.1)
Sodium: 140 mmol/L (ref 136–145)
Total Bilirubin: 0.4 mg/dL (ref 0.2–1.2)
Total Protein: 7.3 g/dL (ref 6.4–8.3)

## 2018-01-02 LAB — CBC WITH DIFFERENTIAL/PLATELET
Basophils Absolute: 0 10*3/uL (ref 0.0–0.1)
Basophils Relative: 0 %
Eosinophils Absolute: 0.1 10*3/uL (ref 0.0–0.5)
Eosinophils Relative: 1 %
HCT: 43.2 % (ref 34.8–46.6)
Hemoglobin: 13.6 g/dL (ref 11.6–15.9)
Lymphocytes Relative: 18 %
Lymphs Abs: 1.3 10*3/uL (ref 0.9–3.3)
MCH: 30 pg (ref 25.1–34.0)
MCHC: 31.5 g/dL (ref 31.5–36.0)
MCV: 95.2 fL (ref 79.5–101.0)
Monocytes Absolute: 0.7 10*3/uL (ref 0.1–0.9)
Monocytes Relative: 9 %
Neutro Abs: 5 10*3/uL (ref 1.5–6.5)
Neutrophils Relative %: 72 %
Platelets: 221 10*3/uL (ref 145–400)
RBC: 4.54 MIL/uL (ref 3.70–5.45)
RDW: 14.9 % — ABNORMAL HIGH (ref 11.2–14.5)
WBC: 7.1 10*3/uL (ref 3.9–10.3)

## 2018-01-02 NOTE — Progress Notes (Signed)
Patient Care Team: [name], MD as PCP - General (Internal Medicine) [name], RN as [facility] Causey, Charlestine Massed, NP as Nurse Practitioner (Hematology and Oncology) Nicholas Lose, MD as Consulting Physician (Hematology and Oncology) Tyler Pita, MD as Consulting Physician (Radiation Oncology) Rolm Bookbinder, MD as Consulting Physician (General Surgery)  DIAGNOSIS:  Encounter Diagnosis  Name Primary?  . 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      09/12/2016 Surgery    Left breast lumpectomy ([place]): IDC, 0.5cm, DCIS 1 cm, fibrosis, margins negative, 1/3 lymph nodes positive for metastases, triple negative, KI67 90%.        10/22/2016 - 12/11/2016 Radiation Therapy    Adjuvant radiation therapy ([place]): 1. The Left breast was treated to 50.4 Gy in 28 fractions at 1.8 Gy per fraction.  2. The Left breast was boosted to 10 Gy  in 5 fractions at 2 Gy per fraction.       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  She felt pain in the left breast and she underwent a mammogram and ultrasound yesterday and is here today to discuss the results.  The mammograms were normal.  She tells me that she is suffering from fever of the cancer coming back on a constant basis.  She is not able to enjoy her life because of this fear.  REVIEW OF SYSTEMS:   Constitutional: Denies fevers, chills or abnormal weight loss Eyes: Denies blurriness of vision Ears, nose, mouth, throat, and face: Denies mucositis or sore throat Respiratory: Denies cough, dyspnea or wheezes Cardiovascular: Denies palpitation, chest discomfort Gastrointestinal:  Denies nausea, heartburn or change in bowel habits, mild abdominal discomfort right upper quadrant Skin: Denies abnormal skin rashes Lymphatics: Denies new lymphadenopathy or easy bruising Neurological:Denies numbness, tingling or new weaknesses Behavioral/Psych: Mood is stable, no new changes  Extremities: No lower extremity edema Breast: Intermittent pain bilateral breasts All other systems were reviewed with the patient and are negative.  I have reviewed the past medical history, past surgical history, social history and family history with the patient  and they are unchanged from previous note.  ALLERGIES:  is allergic to turmeric and decadron [dexamethasone].  MEDICATIONS:  Current Outpatient Medications  Medication Sig Dispense Refill  . Atorvastatin Calcium (INVESTIGATIONAL ATORVASTATIN/PLACEBO) 40 MG tablet [facility] [address] Take 1 tablet daily by mouth. Take 1 tablet daily with or without food. 180 tablet 0    . buPROPion (WELLBUTRIN XL) 150 MG 24 hr tablet Take 150 mg by mouth daily.  4  . cetirizine (ZYRTEC ALLERGY) 10 MG tablet Take 10 mg by mouth daily.    . citalopram (CELEXA) 40 MG tablet   3  . clonazePAM (KLONOPIN) 1 MG tablet Take 1.5 mg by mouth at bedtime.     Marland Kitchen esomeprazole (NEXIUM) 20 MG capsule Take 1 capsule (20 mg total) by mouth daily at 12 noon.    . fluticasone (FLONASE) 50 MCG/ACT nasal spray     . NONFORMULARY OR COMPOUNDED ITEM [facility]:  Antiinflammatory Cream - Diclofenac 35, Baclofen 2%, Lidocaine 2%, apply 1-2 grams to affected area 3-4 times daily. 120 each 2  . nystatin-triamcinolone ointment (MYCOLOG) Apply 1 application topically 2 (two) times daily. 60 g 3  . pramipexole (MIRAPEX) 0.5 MG tablet Take 0.5 mg by mouth at bedtime.    . RESTASIS 0.05 % ophthalmic emulsion Place 1 drop into both eyes 2 (two) times daily as needed. For dry eyes.  4  . zolpidem (AMBIEN) 5 MG tablet Take 5 mg by mouth at bedtime as needed for sleep.     No current facility-administered medications for this visit.    Facility-Administered Medications Ordered in Other Visits  Medication Dose Route Frequency Provider Last Rate Last Dose  . sodium chloride flush (NS) 0.9 % injection 10 mL  10 mL Intracatheter PRN Nicholas Lose, MD   10 mL at 07/19/16 1434    PHYSICAL EXAMINATION: ECOG PERFORMANCE STATUS: 1 - Symptomatic but completely ambulatory  Vitals:   01/02/18 1300  BP: (!) 119/56  Pulse: 68  Resp: 20  Temp: (!) 97.4 F (36.3 C)  SpO2: 99%   Filed Weights   01/02/18 1300  Weight: 218 lb 4.8 oz (99 kg)    GENERAL:alert, no distress and comfortable SKIN: skin color, texture, turgor are normal, no rashes or significant lesions EYES: normal, Conjunctiva are pink and non-injected, sclera clear OROPHARYNX:no exudate, no erythema and lips, buccal mucosa, and tongue normal  NECK: supple, thyroid normal size, non-tender, without nodularity LYMPH:  no palpable  lymphadenopathy in the cervical, axillary or inguinal LUNGS: clear to auscultation and percussion with normal breathing effort HEART: regular rate & rhythm and no murmurs and no lower extremity edema ABDOMEN:abdomen soft, non-tender and normal bowel sounds MUSCULOSKELETAL:no cyanosis of digits and no clubbing  NEURO: alert & oriented x 3 with fluent speech, no focal motor/sensory deficits EXTREMITIES: No lower extremity edema BREAST: No palpable masses or nodules in either right or left breasts. No palpable axillary supraclavicular or infraclavicular adenopathy no breast tenderness or nipple discharge. (exam performed in the presence of a chaperone)  LABORATORY DATA:  I have reviewed the data as listed CMP Latest Ref Rng & Units 05/27/2017 04/22/2017 02/18/2017  Glucose 70 - 140 mg/dl 92 106 87  BUN 7.0 - 26.0 mg/dL 17.9 12.7 15.4  Creatinine 0.6 - 1.1 mg/dL 1.0 0.9 0.9  Sodium 136 - 145 mEq/L 139 142 141  Potassium 3.5 - 5.1 mEq/L 4.0 3.8 3.9  Chloride 101 - 111 mmol/L - - -  CO2 22 - 29 mEq/L 28  25 25  Calcium 8.4 - 10.4 mg/dL 10.0 9.2 9.3  Total Protein 6.4 - 8.3 g/dL 7.4 6.7 6.9  Total Bilirubin 0.20 - 1.20 mg/dL 0.85 1.00 0.57  Alkaline Phos 40 - 150 U/L 69 66 70  AST 5 - 34 U/L 28 29 19   ALT 0 - 55 U/L 52 43 22    Lab Results  Component Value Date   WBC 7.1 01/02/2018   HGB 13.6 01/02/2018   HCT 43.2 01/02/2018   MCV 95.2 01/02/2018   PLT 221 01/02/2018   NEUTROABS 5.0 01/02/2018    ASSESSMENT & PLAN:  Breast cancer of upper-outer quadrant of left female breast ([place]) Mammogram 03/02/2016: Hypoechoic masses in the upper outer quadrant left breast 2 adjacent irregular masses 2.5-1.4 x 1.2 cm and the other 2.7 x 1.4 x 2.3 cm total span 6.4 cm, enlarged multiple axillary lymph nodes measuring 1.7 cm, T2 N1/N2 stage IIb vs IIIa Left breast biopsy 03/19/2016 12:30 position: Invasive ductal carcinoma with DCIS, 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  PREVENT trial: [facility] [id number]: Patient is enrolled in this trial atorvastatin versus placebo No side effects related to the study drug.  Treatment summary:  1. Completed 4 cycles of dose dense Adriamycin and Cytoxan; and a 12 cycles ofAbraxanefrom 04/05/2016 to 08/16/2016 2. left lumpectomy 09/12/2016: IDC 0.5 cm, DCIS 1 cm, margins negative, 1/4 lymph nodes positive, T1aN1 stage II a pathologic stage 3. Adjuvant radiation 10/22/2016 to 12/11/2016 4. Adjuvant Xeloda 1500 mg twice daily 2 weeks on one week off started 01/07/2017, increasedthe dose to 2000g by mouth twice a day from2/26/2018 completed 05/27/2017 Patient refused participation in SWOG 1418 ------------------------------------------------------------------------------------------------------------------------------------ Surveillance: 1. Breast Exam 2. Mammogram left breast: 01/01/2018: No abnormalities or masses We spent almost 40 minutes counseling her and reassuring her regarding the fear of cancer coming back. I discussed with her about staying active.  She and her husband are going to go an RV in her dream is to go to [facility].  RTC in 1 year  I spent 25 minutes talking to the patient of which more than half was spent in counseling and coordination of care.  No orders of the defined types were placed in this encounter.  The patient has a good understanding of the overall plan. she agrees with it. she will call with any problems that may develop before the next visit here.   Harriette Ohara, MD 01/02/18

## 2018-01-02 NOTE — Telephone Encounter (Signed)
Patient declined AVs and calendar of upcoming January 2020 appointments. Patient will receive update in MyChart.  °

## 2018-01-23 ENCOUNTER — Ambulatory Visit: Payer: BC Managed Care – PPO | Admitting: Hematology and Oncology

## 2018-02-04 ENCOUNTER — Encounter: Payer: Self-pay | Admitting: Women's Health

## 2018-02-04 ENCOUNTER — Ambulatory Visit: Payer: BC Managed Care – PPO | Admitting: Women's Health

## 2018-02-04 ENCOUNTER — Other Ambulatory Visit: Payer: Self-pay

## 2018-02-04 ENCOUNTER — Telehealth: Payer: Self-pay

## 2018-02-04 VITALS — BP 136/80 | Ht 64.0 in | Wt 218.0 lb

## 2018-02-04 DIAGNOSIS — Z01419 Encounter for gynecological examination (general) (routine) without abnormal findings: Secondary | ICD-10-CM

## 2018-02-04 DIAGNOSIS — N898 Other specified noninflammatory disorders of vagina: Secondary | ICD-10-CM | POA: Diagnosis not present

## 2018-02-04 MED ORDER — NYSTATIN-TRIAMCINOLONE 100000-0.1 UNIT/GM-% EX OINT: 1.0000 "application " | TOPICAL_OINTMENT | Freq: Two times a day (BID) | CUTANEOUS | 3 refills | Status: DC

## 2018-02-04 NOTE — Progress Notes (Signed)
[id number]

## 2018-02-04 NOTE — Progress Notes (Signed)
Ultrasound scheduled

## 2018-02-04 NOTE — Progress Notes (Signed)
Alexis Fox 1956-03-26 [id number]    History:    Presents for annual exam. Postmenopausal on no HRT with no bleeding. 03/2016 left breast cancer, triple negative, negative BRCA, lumpectomy has completed chemotherapy and radiation treatment.  Normal Pap history. Had a negative colonoscopy at age 62 is due for follow-up. Currently having a pinpoint discomfort in upper left abdominal quadrant for the past several weeks. 10/2015 cholecystectomy.  Past medical history, past surgical history, family history and social history were all reviewed and documented in the EPIC chart. 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 States has fatigue, unable to think as quickly as she once had. 2 children both doing well.  ROS:  A ROS was performed and pertinent positives and negatives are included.  Exam:  Vitals:   02/04/18 1215  BP: 136/80  Weight: 218 lb (98.9 kg)  Height: 5' 4"  (1.626 m)   Body mass index is 37.42 kg/m.   General appearance:  Normal Thyroid:  Symmetrical, normal in size, without palpable masses or nodularity. Respiratory  Auscultation:  Clear without wheezing or rhonchi Cardiovascular  Auscultation:  Regular rate, without rubs, murmurs or gallops  Edema/varicosities:  Not grossly evident Abdominal  Soft,nontender, without masses, guarding or rebound.  Liver/spleen:  No organomegaly noted  Hernia:  None appreciated  Skin  Inspection:  Grossly normal   Breasts: Examined lying and sitting.     Right: Without masses, retractions, discharge or axillary adenopathy.     Left: Without masses, retractions, discharge or axillary adenopathy. Gentitourinary   Inguinal/mons:  Normal without inguinal adenopathy  External genitalia:  Irritation at introitus  BUS/Urethra/Skene's glands:  Normal  Vagina:  Atrophic  Cervix:  Normal  Uterus:  normal in size, shape and contour.  Midline and mobile  Adnexa/parametria:     Rt: Without masses or  tenderness.   Lt: Without masses or tenderness.  Anus and perineum: Normal  Digital rectal exam: Normal sphincter tone without palpated masses or tenderness  Assessment/Plan:  62 y.o. MWF G3 P2  for annual exam with complaint of several weeks discomfort in left upper abdominal quadrant.  1. 2017 left breast cancer/ triple negative/BRCA negative Dr. Lindi Adie managing 2. Postmenopausal/no HRT/no bleeding with external vaginal itching/vaginal atrophy 3. Hypercholesteremia, depression,-primary care manages labs and meds has follow-up scheduled 4. Obesity  Plan: Instructed to call oncologist for abdominal ultrasound referral. Instructed to call if unable to get scheduled. Refill on Mycolog to apply externally for irritation, instructed to call if no relief of symptoms. Loose clothing. SBE's, continue annual screening mammogram, calcium rich foods, vitamin D 2000 daily. Encouraged to increase exercise on lower carb diet. Currently enrolled in the after cancer exercise program. Instructed to have vitamin D level checked at annual exam with primary neck month. Leisure activities, self-care encouraged. Pap with HR HPV typing.     [place], 1:30 PM 02/04/2018

## 2018-02-04 NOTE — Telephone Encounter (Signed)
Received call from patient regarding needing a abdominal ultrasound regarding upper abdominal pain and was referred to have one by her gynecologist. Scheduled her scan and information provided. She is to be NPO past midnight and arrive at 945a for a 10a ultrasound.  Cyndia Bent RN

## 2018-02-04 NOTE — Patient Instructions (Signed)
Health Maintenance for Postmenopausal Women Menopause is a normal process in which your reproductive ability comes to an end. This process happens gradually over a span of months to years, usually between the ages of 22 and 9. Menopause is complete when you have missed 12 consecutive menstrual periods. It is important to talk with your health care provider about some of the most common conditions that affect postmenopausal women, such as heart disease, cancer, and bone loss (osteoporosis). Adopting a healthy lifestyle and getting preventive care can help to promote your health and wellness. Those actions can also lower your chances of developing some of these common conditions. What should I know about menopause? During menopause, you may experience a number of symptoms, such as:  Moderate-to-severe hot flashes.  Night sweats.  Decrease in sex drive.  Mood swings.  Headaches.  Tiredness.  Irritability.  Memory problems.  Insomnia.  Choosing to treat or not to treat menopausal changes is an individual decision that you make with your health care provider. What should I know about hormone replacement therapy and supplements? Hormone therapy products are effective for treating symptoms that are associated with menopause, such as hot flashes and night sweats. Hormone replacement carries certain risks, especially as you become older. If you are thinking about using estrogen or estrogen with progestin treatments, discuss the benefits and risks with your health care provider. What should I know about heart disease and stroke? Heart disease, heart attack, and stroke become more likely as you age. This may be due, in part, to the hormonal changes that your body experiences during menopause. These can affect how your body processes dietary fats, triglycerides, and cholesterol. Heart attack and stroke are both medical emergencies. There are many things that you can do to help prevent heart disease  and stroke:  Have your blood pressure checked at least every 1-2 years. High blood pressure causes heart disease and increases the risk of stroke.  If you are 53-22 years old, ask your health care provider if you should take aspirin to prevent a heart attack or a stroke.  Do not use any tobacco products, including cigarettes, chewing tobacco, or electronic cigarettes. If you need help quitting, ask your health care provider.  It is important to eat a healthy diet and maintain a healthy weight. ? Be sure to include plenty of vegetables, fruits, low-fat dairy products, and lean protein. ? Avoid eating foods that are high in solid fats, added sugars, or salt (sodium).  Get regular exercise. This is one of the most important things that you can do for your health. ? Try to exercise for at least 150 minutes each week. The type of exercise that you do should increase your heart rate and make you sweat. This is known as moderate-intensity exercise. ? Try to do strengthening exercises at least twice each week. Do these in addition to the moderate-intensity exercise.  Know your numbers.Ask your health care provider to check your cholesterol and your blood glucose. Continue to have your blood tested as directed by your health care provider.  What should I know about cancer screening? There are several types of cancer. Take the following steps to reduce your risk and to catch any cancer development as early as possible. Breast Cancer  Practice breast self-awareness. ? This means understanding how your breasts normally appear and feel. ? It also means doing regular breast self-exams. Let your health care provider know about any changes, no matter how small.  If you are 40  or older, have a clinician do a breast exam (clinical breast exam or CBE) every year. Depending on your age, family history, and medical history, it may be recommended that you also have a yearly breast X-ray (mammogram).  If you  have a family history of breast cancer, talk with your health care provider about genetic screening.  If you are at high risk for breast cancer, talk with your health care provider about having an MRI and a mammogram every year.  Breast cancer (BRCA) gene test is recommended for women who have family members with BRCA-related cancers. Results of the assessment will determine the need for genetic counseling and BRCA1 and for BRCA2 testing. BRCA-related cancers include these types: ? Breast. This occurs in males or females. ? Ovarian. ? Tubal. This may also be called fallopian tube cancer. ? Cancer of the abdominal or pelvic lining (peritoneal cancer). ? Prostate. ? Pancreatic.  Cervical, Uterine, and Ovarian Cancer Your health care provider may recommend that you be screened regularly for cancer of the pelvic organs. These include your ovaries, uterus, and vagina. This screening involves a pelvic exam, which includes checking for microscopic changes to the surface of your cervix (Pap test).  For women ages 21-65, health care providers may recommend a pelvic exam and a Pap test every three years. For women ages 79-65, they may recommend the Pap test and pelvic exam, combined with testing for human papilloma virus (HPV), every five years. Some types of HPV increase your risk of cervical cancer. Testing for HPV may also be done on women of any age who have unclear Pap test results.  Other health care providers may not recommend any screening for nonpregnant women who are considered low risk for pelvic cancer and have no symptoms. Ask your health care provider if a screening pelvic exam is right for you.  If you have had past treatment for cervical cancer or a condition that could lead to cancer, you need Pap tests and screening for cancer for at least 20 years after your treatment. If Pap tests have been discontinued for you, your risk factors (such as having a new sexual partner) need to be  reassessed to determine if you should start having screenings again. Some women have medical problems that increase the chance of getting cervical cancer. In these cases, your health care provider may recommend that you have screening and Pap tests more often.  If you have a family history of uterine cancer or ovarian cancer, talk with your health care provider about genetic screening.  If you have vaginal bleeding after reaching menopause, tell your health care provider.  There are currently no reliable tests available to screen for ovarian cancer.  Lung Cancer Lung cancer screening is recommended for adults 69-62 years old who are at high risk for lung cancer because of a history of smoking. A yearly low-dose CT scan of the lungs is recommended if you:  Currently smoke.  Have a history of at least 30 pack-years of smoking and you currently smoke or have quit within the past 15 years. A pack-year is smoking an average of one pack of cigarettes per day for one year.  Yearly screening should:  Continue until it has been 15 years since you quit.  Stop if you develop a health problem that would prevent you from having lung cancer treatment.  Colorectal Cancer  This type of cancer can be detected and can often be prevented.  Routine colorectal cancer screening usually begins at  age 42 and continues through age 45.  If you have risk factors for colon cancer, your health care provider may recommend that you be screened at an earlier age.  If you have a family history of colorectal cancer, talk with your health care provider about genetic screening.  Your health care provider may also recommend using home test kits to check for hidden blood in your stool.  A small camera at the end of a tube can be used to examine your colon directly (sigmoidoscopy or colonoscopy). This is done to check for the earliest forms of colorectal cancer.  Direct examination of the colon should be repeated every  5-10 years until age 71. However, if early forms of precancerous polyps or small growths are found or if you have a family history or genetic risk for colorectal cancer, you may need to be screened more often.  Skin Cancer  Check your skin from head to toe regularly.  Monitor any moles. Be sure to tell your health care provider: ? About any new moles or changes in moles, especially if there is a change in a mole's shape or color. ? If you have a mole that is larger than the size of a pencil eraser.  If any of your family members has a history of skin cancer, especially at a Latori Beggs age, talk with your health care provider about genetic screening.  Always use sunscreen. Apply sunscreen liberally and repeatedly throughout the day.  Whenever you are outside, protect yourself by wearing long sleeves, pants, a wide-brimmed hat, and sunglasses.  What should I know about osteoporosis? Osteoporosis is a condition in which bone destruction happens more quickly than new bone creation. After menopause, you may be at an increased risk for osteoporosis. To help prevent osteoporosis or the bone fractures that can happen because of osteoporosis, the following is recommended:  If you are 46-71 years old, get at least 1,000 mg of calcium and at least 600 mg of vitamin D per day.  If you are older than age 55 but younger than age 65, get at least 1,200 mg of calcium and at least 600 mg of vitamin D per day.  If you are older than age 54, get at least 1,200 mg of calcium and at least 800 mg of vitamin D per day.  Smoking and excessive alcohol intake increase the risk of osteoporosis. Eat foods that are rich in calcium and vitamin D, and do weight-bearing exercises several times each week as directed by your health care provider. What should I know about how menopause affects my mental health? Depression may occur at any age, but it is more common as you become older. Common symptoms of depression  include:  Low or sad mood.  Changes in sleep patterns.  Changes in appetite or eating patterns.  Feeling an overall lack of motivation or enjoyment of activities that you previously enjoyed.  Frequent crying spells.  Talk with your health care provider if you think that you are experiencing depression. What should I know about immunizations? It is important that you get and maintain your immunizations. These include:  Tetanus, diphtheria, and pertussis (Tdap) booster vaccine.  Influenza every year before the flu season begins.  Pneumonia vaccine.  Shingles vaccine.  Your health care provider may also recommend other immunizations. This information is not intended to replace advice given to you by your health care provider. Make sure you discuss any questions you have with your health care provider. Document Released: 01/11/2006  Document Revised: 06/08/2016 Document Reviewed: 08/23/2015 Elsevier Interactive Patient Education  2018 [employer]vier [employer]  

## 2018-02-06 LAB — PAP IG W/ RFLX HPV ASCU

## 2018-02-07 ENCOUNTER — Other Ambulatory Visit: Payer: Self-pay | Admitting: Hematology and Oncology

## 2018-02-07 ENCOUNTER — Ambulatory Visit (HOSPITAL_COMMUNITY)
Admission: RE | Admit: 2018-02-07 | Discharge: 2018-02-07 | Disposition: A | Discharge status: Home / Self Care | Payer: BC Managed Care – PPO | Source: Ambulatory Visit | Attending: Adult Health | Admitting: Adult Health

## 2018-02-07 ENCOUNTER — Other Ambulatory Visit: Payer: Self-pay | Admitting: *Deleted

## 2018-02-07 DIAGNOSIS — Z006 Encounter for examination for normal comparison and control in clinical research program: Secondary | ICD-10-CM

## 2018-02-07 DIAGNOSIS — R1011 Right upper quadrant pain: Secondary | ICD-10-CM | POA: Diagnosis not present

## 2018-02-07 DIAGNOSIS — Z853 Personal history of malignant neoplasm of breast: Secondary | ICD-10-CM | POA: Insufficient documentation

## 2018-02-07 DIAGNOSIS — C50412 Malignant neoplasm of upper-outer quadrant of left female breast: Secondary | ICD-10-CM

## 2018-02-10 ENCOUNTER — Telehealth: Payer: Self-pay

## 2018-02-10 ENCOUNTER — Telehealth: Payer: Self-pay | Admitting: Hematology and Oncology

## 2018-02-10 NOTE — Telephone Encounter (Signed)
Mailed patient calendar of upcoming may appointments per 3/8 sch message.

## 2018-02-10 NOTE — Telephone Encounter (Signed)
Returned pt call regarding abd ultrasound. Left VM for pt to call us back with results.  Cyndia Bent RN

## 2018-02-11 ENCOUNTER — Telehealth: Payer: Self-pay

## 2018-02-11 NOTE — Telephone Encounter (Signed)
Pt called to request results on her recent abd [place]. Called pt to let her know that her Ultrasound was normal. Pt appreciative of call. No further concerns needed.

## 2018-02-12 ENCOUNTER — Telehealth: Payer: Self-pay

## 2018-02-12 NOTE — Telephone Encounter (Signed)
-----   Message from Gardenia Phlegm, NP sent at 02/07/2018  2:15 PM EST ----- Please call patient and give her abdominal ultrasound results.  They were normal. ----- Message ----- From: Interface, Rad Results In Sent: 02/07/2018   1:52 PM To: Gardenia Phlegm, NP

## 2018-02-12 NOTE — Telephone Encounter (Signed)
Outgoing call to patient to notify of recent abd u/s results per Wilber Bihari NP as "They were normal".  Pt reports she is still having some pain so she has a PCP appt next week and that she spoke with her OB/Gyn physician as well and plans to have a colonoscopy since it's been '12 years' since the last one.   No other needs per pt at this time.

## 2018-02-14 ENCOUNTER — Other Ambulatory Visit: Payer: Self-pay | Admitting: Adult Health

## 2018-02-14 DIAGNOSIS — Z853 Personal history of malignant neoplasm of breast: Secondary | ICD-10-CM

## 2018-03-10 ENCOUNTER — Other Ambulatory Visit: Payer: Self-pay | Admitting: Adult Health

## 2018-03-10 ENCOUNTER — Ambulatory Visit
Admission: RE | Admit: 2018-03-10 | Discharge: 2018-03-10 | Disposition: A | Discharge status: Home / Self Care | Payer: BC Managed Care – PPO | Source: Ambulatory Visit | Attending: Adult Health | Admitting: Adult Health

## 2018-03-10 DIAGNOSIS — Z853 Personal history of malignant neoplasm of breast: Secondary | ICD-10-CM

## 2018-03-10 DIAGNOSIS — N641 Fat necrosis of breast: Secondary | ICD-10-CM

## 2018-03-10 DIAGNOSIS — N644 Mastodynia: Secondary | ICD-10-CM

## 2018-04-03 ENCOUNTER — Inpatient Hospital Stay: Payer: BC Managed Care – PPO | Admitting: *Deleted

## 2018-04-03 ENCOUNTER — Encounter: Payer: Self-pay | Admitting: Hematology and Oncology

## 2018-04-03 ENCOUNTER — Encounter: Payer: Self-pay | Admitting: *Deleted

## 2018-04-03 ENCOUNTER — Inpatient Hospital Stay: Payer: BC Managed Care – PPO

## 2018-04-03 ENCOUNTER — Inpatient Hospital Stay: Payer: BC Managed Care – PPO | Attending: Hematology and Oncology

## 2018-04-03 ENCOUNTER — Ambulatory Visit (HOSPITAL_COMMUNITY)
Admission: RE | Admit: 2018-04-03 | Discharge: 2018-04-03 | Disposition: A | Discharge status: Home / Self Care | Payer: BC Managed Care – PPO | Source: Ambulatory Visit | Attending: Hematology and Oncology | Admitting: Hematology and Oncology

## 2018-04-03 DIAGNOSIS — C50412 Malignant neoplasm of upper-outer quadrant of left female breast: Secondary | ICD-10-CM

## 2018-04-03 DIAGNOSIS — Z006 Encounter for examination for normal comparison and control in clinical research program: Secondary | ICD-10-CM | POA: Insufficient documentation

## 2018-04-03 LAB — RESEARCH LABS

## 2018-04-03 NOTE — Progress Notes (Signed)
04/03/18 at 4:08pm - PREVENT month 24 study notes- The pt had her month 24 cardiac MRI at [facility] this afternoon.  The pt then had her month 24 research labs drawn.  The pt had her height and weight obtained along with her vital signs today.  The pt then met with research nurse and Remer Macho, research specialist, and completed her month 24 booklet. The pt's waist measurement was obtained, and it was 40 inches.  The pt denies any peripheral edema.  The pt reviewed her current medications.  The pt said that she has started taking Vitamin D since her last visit.  The pt denied any adverse events other than some moderate memory/cognition impairment.  The pt was told that the nurse would contact her in 1 month to assess how she has tolerated stopping her study drug.  The pt returned her medication diaries for March, April, and May 2019.  The pt also returned her study drug bottle with 4 pills inside.  The pt's study drug bottle was taken to the pharmacy for the drug accountability check.  The pt's compliance percentage for taking study drug from 10/07/17 through 04/03/18 is 98.9%.  The pt was thanked for her support of the study.  The pt was given the completion certificate with Dr. Jodelle Green photo acknowledging the pt's contribution to the study.   Brion Aliment RN, BSN, CCRP  Clinical Research Nurse 04/03/2018 4:15 PM   [name] [id number]  04/07/2018  Adverse Event Log  Study/Protocol: PREVENT month 24 visit  Event Grade Onset Date Resolved Date Drug Name Attribution Comments  Memory impairment  2 04/03/18  ongoing Atorvastatin/placebo unrelated Dr. Lindi Adie mentioned pt's memory issues in his 07/24/17 note

## 2018-05-05 ENCOUNTER — Telehealth: Payer: Self-pay | Admitting: *Deleted

## 2018-05-05 NOTE — Telephone Encounter (Signed)
05/05/18 at 1:02pm - PREVENT month 25 follow up call - The research nurse called the pt to check on her status since the pt stopped study drug on 04/03/18 (last recorded dose).  The pt said that she has been doing well with no new health problems.  The pt specifically denied any leg pain since she stopped taking her study drug ( atorvastatin/placebo).  The pt also denied any other problems with regards to her study drug.  The pt denied being seen in the ER or any hospitalizations related to the study drug since she stopped her study treatment.  The pt was told that her month 24 labs were all reviewed by Dr. Lindi Adie, and the pt's labs were all within normal limits. The pt was also informed that her month 24 cardiac MRI was "negative for alert findings".  The pt was happy to hear that her labs and MRI were all normal.  The pt had no questions for the research nurse.  The pt was thanked for her support of this trial. The pt was informed that her study participation has been completed.   Brion Aliment RN, BSN, CCRP Clinical Research Nurse 05/05/2018 1:09 PM

## 2018-05-22 MED FILL — PEG-3350 SOLUTION: 420 | 2 days supply | Qty: 4000 | Fill #0

## 2018-06-04 ENCOUNTER — Telehealth: Payer: Self-pay | Admitting: Hematology and Oncology

## 2018-06-04 ENCOUNTER — Telehealth: Payer: Self-pay

## 2018-06-04 NOTE — Telephone Encounter (Signed)
Returned patient's call requesting appointment.  Patient would like to discuss continued effects from treatment that have not resolved with Dr. Lindi Adie.  Patient requested appointment to be after 06/24/2018.  Scheduling message sent.  Patient aware and voiced understanding.  No further concerns at this time.

## 2018-06-04 NOTE — Telephone Encounter (Signed)
Spoke to patient regarding upcoming July appts per 7/3 sch message.

## 2018-06-06 ENCOUNTER — Ambulatory Visit (INDEPENDENT_AMBULATORY_CARE_PROVIDER_SITE_OTHER): Payer: BC Managed Care – PPO | Admitting: Orthopaedic Surgery

## 2018-06-06 ENCOUNTER — Ambulatory Visit (INDEPENDENT_AMBULATORY_CARE_PROVIDER_SITE_OTHER): Payer: Self-pay

## 2018-06-06 ENCOUNTER — Encounter (INDEPENDENT_AMBULATORY_CARE_PROVIDER_SITE_OTHER): Payer: Self-pay | Admitting: Orthopaedic Surgery

## 2018-06-06 VITALS — BP 102/63 | HR 63 | Ht 64.0 in | Wt 198.0 lb

## 2018-06-06 DIAGNOSIS — M5441 Lumbago with sciatica, right side: Secondary | ICD-10-CM

## 2018-06-06 DIAGNOSIS — M5442 Lumbago with sciatica, left side: Secondary | ICD-10-CM

## 2018-06-06 DIAGNOSIS — M25552 Pain in left hip: Secondary | ICD-10-CM | POA: Diagnosis not present

## 2018-06-06 DIAGNOSIS — G8929 Other chronic pain: Secondary | ICD-10-CM

## 2018-06-06 MED ORDER — LIDOCAINE HCL 1 % IJ SOLN
2.0000 mL | INTRAMUSCULAR | Status: AC | PRN
  Administered 2018-06-06: 2 mL

## 2018-06-06 MED ORDER — METHYLPREDNISOLONE ACETATE 40 MG/ML IJ SUSP
80.0000 mg | INTRAMUSCULAR | Status: AC | PRN
  Administered 2018-06-06: 80 mg

## 2018-06-06 MED ORDER — BUPIVACAINE HCL 0.5 % IJ SOLN
2.0000 mL | INTRAMUSCULAR | Status: AC | PRN
  Administered 2018-06-06: 2 mL via INTRA_ARTICULAR

## 2018-06-06 NOTE — Progress Notes (Signed)
Office Visit Note   Patient: Alexis Fox           Date of Birth: [birth year]           MRN: [id number] Visit Date: 06/06/2018              Requested by: [name], MD [address] [place], [place] [address] PCP: [name], MD   Assessment & Plan: Visit Diagnoses:  1. Pain in left hip   2. Chronic bilateral low back pain with bilateral sciatica     Plan: Greater trochanteric bursitis left hip.  Will inject with cortisone.  Chronic right shoulder pain.  Will reevaluate in 2 weeks with injection.  Also has chronic history of low back pain with referred discomfort to both lower extremities.  Will order MRI scan with and without contrast with the Mars technique  Follow-Up Instructions: Return in about 2 weeks (around 06/20/2018).   Orders:  Orders Placed This Encounter  Procedures  . Large Joint Inj: L greater trochanter  . XR HIP UNILAT W OR W/O PELVIS 2-3 VIEWS LEFT   No orders of the defined types were placed in this encounter.     Procedures: Large Joint Inj: L greater trochanter on 06/06/2018 12:02 PM Indications: pain and diagnostic evaluation Details: 25 G 1.5 in needle, lateral approach  Arthrogram: No  Medications: 2 mL lidocaine 1 %; 2 mL bupivacaine 0.5 %; 80 mg methylPREDNISolone acetate 40 MG/ML Procedure, treatment alternatives, risks and benefits explained, specific risks discussed. Consent was given by the patient. Immediately prior to procedure a time out was called to verify the correct patient, procedure, equipment, support staff and site/side marked as required. Patient was prepped and draped in the usual sterile fashion.       Clinical Data: No additional findings.   Subjective: Chief Complaint  Patient presents with  . New Patient (Initial Visit)    l hip pain for 6 mo no injury, r shoulder pain for few yrs, had injtion by Aaron Edelman pt dosent remember when  [place] has a number of issues presently for evaluation.  She is been  experiencing past 6 months of pain over the greater trochanter and hip.  Does walk with a limp.  Not having any particular groin pain.  Has chronic back pain.  Has been followed by Dr. Vertell Limber with prior instrumentation at L4-5.  Continues to have back pain bilateral leg pain and tingling into both toes.  Would like to obtain a new MRI scan as its been over a year and be referred to Dr. Patrice Paradise.  In addition she has had some difficulty with recurrent pain in her right shoulder.  She has had prior cortisone injection.  HPI  Review of Systems  Constitutional: Positive for fatigue. Negative for fever.  HENT: Negative for ear pain.   Eyes: Negative for pain.  Respiratory: Negative for cough and shortness of breath.   Cardiovascular: Negative for leg swelling.  Gastrointestinal: Negative for constipation and diarrhea.  Genitourinary: Negative for difficulty urinating.  Musculoskeletal: Positive for back pain. Negative for neck pain.  Skin: Negative for rash.  Allergic/Immunologic: Negative for food allergies.  Neurological: Positive for weakness and numbness.  Hematological: Does not bruise/bleed easily.  Psychiatric/Behavioral: Positive for sleep disturbance.     Objective: Vital Signs: BP 102/63 (BP Location: Right Arm, Patient Position: Sitting, Cuff Size: Normal)   Pulse 63   Ht 5\' 4"  (1.626 m)   Wt 198 lb (89.8 kg)  BMI 33.99 kg/m   Physical Exam  Constitutional: She is oriented to person, place, and time. She appears well-developed and well-nourished.  HENT:  Mouth/Throat: Oropharynx is clear and moist.  Eyes: Pupils are equal, round, and reactive to light. EOM are normal.  Pulmonary/Chest: Effort normal.  Neurological: She is alert and oriented to person, place, and time.  Skin: Skin is warm and dry.  Psychiatric: She has a normal mood and affect. Her behavior is normal.    Ortho Exam awake alert and oriented x3.  Comfortable sitting .does.  Walk with a limp referable to her  left hip.  Has some tenderness directly over the greater trochanter of the left hip.  Large area of adipose trochanter.  No skin changes.  No pain with internal/external rotation left groin.  Straight leg raise negative.  Has chronic back pain to percussion.  Been experiencing tingling in both lower extremities.  Specialty Comments:  No specialty comments available.  Imaging: Xr Hip Unilat W Or W/o Pelvis 2-3 Views Left  Result Date: 06/06/2018 AP pelvis and lateral left hip are obtained.  There is no evidence of any arthritis or acute change.  Iliac joints intact.  Prior instrumentation at L4-5    PMFS History: Patient Active Problem List   Diagnosis Date Noted  . Central line complication 06/01/1600  . Genetic testing 05/15/2016  . Family history of breast cancer   . Family history of brain cancer   . Breast cancer of upper-outer quadrant of left female breast ([place]) 03/20/2016  . Transaminitis 10/03/2015  . Symptomatic cholelithiasis 10/03/2015  . RUQ abdominal pain   . Depression 06/24/2013  . Restless leg syndrome 06/24/2013   Past Medical History:  Diagnosis Date  . Anxiety   . Arthritis    knees  . Breast cancer ([place])   . Breast cancer of upper-outer quadrant of left female breast ([place]) 03/20/2016  . Cholecystitis   . Complication of anesthesia    BP "crashes" post op  . Depression   . Family history of brain cancer   . Family history of breast cancer   . Hot flashes   . Personal history of radiation therapy   . Restless leg syndrome     Family History  Problem Relation Age of Onset  . Heart disease Mother   . Pulmonary fibrosis Mother   . Hypertension Mother   . Cancer Father 59       astocytoma  . Hypertension Brother   . Lymphoma Maternal Aunt   . Anesthesia problems Neg Hx     Past Surgical History:  Procedure Laterality Date  . BREAST BIOPSY    . BREAST LUMPECTOMY    . BUNIONECTOMY    . CHOLECYSTECTOMY N/A 10/04/2015   Procedure: LAPAROSCOPIC  CHOLECYSTECTOMY WITH INTRAOPERATIVE CHOLANGIOGRAM;  Surgeon: Greer Pickerel, MD;  Location: [facility];  Service: General;  Laterality: N/A;  . ELBOW SURGERY     right for epicondylitis 2010   . FOOT SURGERY     1983 -tarsal tunnel release  . IR GENERIC HISTORICAL  10/26/2016   IR CV LINE INJECTION 10/26/2016 [facility]-INTERV RAD  . LUMBAR DISC SURGERY  04/04/2012  . PORT-A-CATH REMOVAL N/A 11/15/2016   Procedure: REMOVAL PORT-A-CATH;  Surgeon: Rolm Bookbinder, MD;  Location: [facility];  Service: General;  Laterality: N/A;  . PORTACATH PLACEMENT Right 04/02/2016   Procedure: INSERTION PORT-A-CATH WITH [place];  Surgeon: Rolm Bookbinder, MD;  Location: [facility];  Service: General;  Laterality: Right;  .  RADIOACTIVE SEED GUIDED PARTIAL MASTECTOMY/AXILLARY SENTINEL NODE BIOPSY/AXILLARY NODE DISSECTION Left 09/12/2016   Procedure: LEFT BREAST SEED GUIDED LUMPECTOMY, LEFT AXILLARY SENTINEL NODE BIOPSY, LEFT SEED GUIDED AXILLARY NODE EXCISION( TARGETED AXILLARY DISSECTION), BLUE DYE INJECTION;  Surgeon: Rolm Bookbinder, MD;  Location: [facility];  Service: General;  Laterality: Left;  . SPINAL FUSION  5/12   L5-S1  . TARSAL TUNNEL RELEASE     Social History   Occupational History  . Not on file  Tobacco Use  . Smoking status: Never Smoker  . Smokeless tobacco: Never Used  Substance and Sexual Activity  . Alcohol use: No  . Drug use: No  . Sexual activity: Not Currently    Birth control/protection: Post-menopausal

## 2018-06-23 ENCOUNTER — Ambulatory Visit (INDEPENDENT_AMBULATORY_CARE_PROVIDER_SITE_OTHER): Payer: BC Managed Care – PPO | Admitting: Orthopaedic Surgery

## 2018-06-27 ENCOUNTER — Ambulatory Visit (INDEPENDENT_AMBULATORY_CARE_PROVIDER_SITE_OTHER): Payer: BC Managed Care – PPO | Admitting: Orthopaedic Surgery

## 2018-07-01 ENCOUNTER — Other Ambulatory Visit: Payer: Self-pay | Admitting: Hematology and Oncology

## 2018-07-01 ENCOUNTER — Ambulatory Visit
Admission: RE | Admit: 2018-07-01 | Discharge: 2018-07-01 | Disposition: A | Discharge status: Home / Self Care | Payer: BC Managed Care – PPO | Source: Ambulatory Visit | Attending: Adult Health | Admitting: Adult Health

## 2018-07-01 DIAGNOSIS — N6320 Unspecified lump in the left breast, unspecified quadrant: Secondary | ICD-10-CM

## 2018-07-01 DIAGNOSIS — N641 Fat necrosis of breast: Secondary | ICD-10-CM

## 2018-07-03 ENCOUNTER — Inpatient Hospital Stay: Payer: BC Managed Care – PPO | Attending: Hematology and Oncology | Admitting: Hematology and Oncology

## 2018-07-03 DIAGNOSIS — C50412 Malignant neoplasm of upper-outer quadrant of left female breast: Secondary | ICD-10-CM

## 2018-07-03 DIAGNOSIS — Z9221 Personal history of antineoplastic chemotherapy: Secondary | ICD-10-CM | POA: Diagnosis not present

## 2018-07-03 DIAGNOSIS — Z923 Personal history of irradiation: Secondary | ICD-10-CM | POA: Diagnosis not present

## 2018-07-03 DIAGNOSIS — Z853 Personal history of malignant neoplasm of breast: Secondary | ICD-10-CM | POA: Insufficient documentation

## 2018-07-03 NOTE — Progress Notes (Signed)
Patient Care Team: [name], MD as PCP - General (Internal Medicine) [name], RN as [place] [facility] Causey, Charlestine Massed, NP as Nurse Practitioner (Hematology and Oncology) Nicholas Lose, MD as Consulting Physician (Hematology and Oncology) Tyler Pita, MD as Consulting Physician (Radiation Oncology) Rolm Bookbinder, MD as Consulting Physician (General Surgery)  DIAGNOSIS:  Encounter Diagnosis  Name Primary?  . 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      09/12/2016 Surgery    Left breast lumpectomy ([facility]): IDC, 0.5cm, DCIS 1 cm, fibrosis, margins negative, 1/3 lymph nodes positive for metastases, triple negative, KI67 90%.        10/22/2016 - 12/11/2016 Radiation Therapy    Adjuvant radiation therapy ([place]): 1. The Left breast was treated to 50.4 Gy in 28 fractions at 1.8 Gy per fraction.  2. The Left breast was boosted to 10 Gy  in 5 fractions at 2 Gy per fraction.       01/14/2017 - 05/27/2017 Chemotherapy    Xeloda 2000 mg by mouth twice a day 2 weeks on one week off for 6 months       CHIEF COMPLIANT: Follow-up to discuss her multiple symptoms  INTERVAL HISTORY: Alexis Fox is a 62 year old with above-mentioned triple negative breast cancer who completed therapy and is currently under surveillance.  She continues to have chemotherapy related side effects that has affected her physically as well as emotionally.  She also has difficulty with memory and recalling simple things.  Physically she has bursts of energy but then has profound fatigue that keeps her from doing any activities.  She is trying her best to exercise as well as improve her mental capacity for memory but it has become very difficult for her.  Because of this she may be applying for Social Security disability.  REVIEW OF SYSTEMS:   Constitutional: Denies fevers, chills or abnormal weight loss Eyes: Denies blurriness of vision Ears, nose, mouth, throat, and face: Denies mucositis or sore throat Respiratory: Denies cough, dyspnea or wheezes Cardiovascular: Denies palpitation, chest discomfort Gastrointestinal:  Denies nausea, heartburn or change in bowel habits Skin: Denies abnormal skin rashes Lymphatics: Denies new lymphadenopathy or easy bruising Neurological:Denies numbness, tingling or new weaknesses Behavioral/Psych: Mood is stable, no new changes  Extremities: No lower extremity edema  all other systems were reviewed with the patient and are negative.  I have  reviewed the past medical history, past surgical history, social history and family history with the patient and they are unchanged from previous note.  ALLERGIES:  is allergic to turmeric and decadron [dexamethasone].  MEDICATIONS:  Current Outpatient Medications  Medication Sig Dispense Refill  . buPROPion (WELLBUTRIN XL) 150 MG 24 hr tablet Take 150 mg by mouth daily.  4  .  cetirizine (ZYRTEC ALLERGY) 10 MG tablet Take 10 mg by mouth daily.    . citalopram (CELEXA) 40 MG tablet   3  . clonazePAM (KLONOPIN) 1 MG tablet Take 1.5 mg by mouth at bedtime.     Marland Kitchen esomeprazole (NEXIUM) 20 MG capsule Take 1 capsule (20 mg total) by mouth daily at 12 noon. (Patient not taking: Reported on 06/06/2018)    . fluticasone (FLONASE) 50 MCG/ACT nasal spray     . NONFORMULARY OR COMPOUNDED ITEM [facility]:  Antiinflammatory Cream - Diclofenac 35, Baclofen 2%, Lidocaine 2%, apply 1-2 grams to affected area 3-4 times daily. (Patient not taking: Reported on 06/06/2018) 120 each 2  . nystatin-triamcinolone ointment (MYCOLOG) Apply 1 application topically 2 (two) times daily. 60 g 3  . pramipexole (MIRAPEX) 0.5 MG tablet Take 0.5 mg by mouth at bedtime.    Marland Kitchen zolpidem (AMBIEN) 5 MG tablet Take 5 mg by mouth at bedtime as needed for sleep.     No current facility-administered medications for this visit.    Facility-Administered Medications Ordered in Other Visits  Medication Dose Route Frequency Provider Last Rate Last Dose  . sodium chloride flush (NS) 0.9 % injection 10 mL  10 mL Intracatheter PRN Nicholas Lose, MD   10 mL at 07/19/16 1434    PHYSICAL EXAMINATION: ECOG PERFORMANCE STATUS: 1 - Symptomatic but completely ambulatory  Vitals:   07/03/18 1127  BP: 115/72  Pulse: 67  Resp: 18  Temp: 98.1 F (36.7 C)  SpO2: 99%   Filed Weights   07/03/18 1127  Weight: 195 lb 14.4 oz (88.9 kg)    GENERAL:alert, no distress and comfortable SKIN: skin color, texture, turgor are normal, no rashes or significant lesions EYES: normal, Conjunctiva are pink and non-injected, sclera clear OROPHARYNX:no exudate, no erythema and lips, buccal mucosa, and tongue normal  NECK: supple, thyroid normal size, non-tender, without nodularity LYMPH:  no palpable lymphadenopathy in the cervical, axillary or inguinal LUNGS: clear to auscultation and percussion with normal breathing  effort HEART: regular rate & rhythm and no murmurs and no lower extremity edema ABDOMEN:abdomen soft, non-tender and normal bowel sounds MUSCULOSKELETAL:no cyanosis of digits and no clubbing  NEURO: alert & oriented x 3 with fluent speech, no focal motor/sensory deficits EXTREMITIES: No lower extremity edema   LABORATORY DATA:  I have reviewed the data as listed CMP Latest Ref Rng & Units 01/02/2018 05/27/2017 04/22/2017  Glucose 70 - 140 mg/dL 84 92 106  BUN 7 - 26 mg/dL 15 17.9 12.7  Creatinine 0.60 - 1.10 mg/dL 0.91 1.0 0.9  Sodium 136 - 145 mmol/L 140 139 142  Potassium 3.5 - 5.1 mmol/L 4.3 4.0 3.8  Chloride 98 - 109 mmol/L 105 - -  CO2 22 - 29 mmol/L _0 Calcium 8.4 - 10.4 mg/dL 9.5 10.0 9.2  Total Protein 6.4 - 8.3 g/dL 7.3 7.4 6.7  Total Bilirubin 0.2 - 1.2 mg/dL 0.4 0.85 1.00  Alkaline Phos 40 - 150 U/L 73 69 66  AST 5 - 34 U/L _1 ALT 0 - 55 U/L 17 52 43  Lab Results  Component Value Date   WBC 7.1 01/02/2018   HGB 13.6 01/02/2018   HCT 43.2 01/02/2018   MCV 95.2 01/02/2018   PLT 221 01/02/2018   NEUTROABS 5.0 01/02/2018    ASSESSMENT & PLAN:  Breast cancer of upper-outer quadrant of left female breast ([place]) Mammogram 03/02/2016: Hypoechoic masses in the upper outer quadrant left breast 2 adjacent irregular masses 2.5-1.4 x 1.2 cm and the other 2.7 x 1.4 x 2.3 cm total span 6.4 cm, enlarged multiple axillary lymph nodes measuring 1.7 cm, T2 N1/N2 stage IIb vs IIIa Left breast biopsy 03/19/2016 12:30 position: Invasive ductal carcinoma with DCIS, 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  PREVENT trial: [facility] [id number]: Patient is enrolled in this trial atorvastatin versus placebo No side effects related to the study drug.  Treatment summary:  1.  Completed 4 cycles of dose dense Adriamycin and Cytoxan; and a 12 cycles ofAbraxanefrom 04/05/2016 to 08/16/2016 2. left lumpectomy 09/12/2016: IDC 0.5 cm, DCIS 1 cm, margins negative, 1/4 lymph nodes positive, T1aN1 stage II a pathologic stage 3. Adjuvant radiation 10/22/2016 to 12/11/2016 4. Adjuvant Xeloda 1500 mg twice daily 2 weeks on one week off started 01/07/2017, increasedthe dose to 2000g by mouth twice a day from2/26/2018 completed 05/27/2017 Patient refused participation [id number] ------------------------------------------------------------------------------------------------------------------------------------ Surveillance: 1. Breast Exam 07/03/2018: Benign 2. Mammogram left breast: 01/01/2018: No abnormalities or masses  Physical disability: Because of prior breast cancer chemo, she is unable to sustain long hours of physical activity.  She has short bursts of energy but then she has to rest afterwards. 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 She may be applying for Social Security disability.  RTC in 6 months for follow-up.  No orders of the defined types were placed in this encounter.  The patient has a good understanding of the overall plan. she agrees with it. she will call with any problems that may develop before the next visit here.   Harriette Ohara, MD 07/03/18

## 2018-07-03 NOTE — Assessment & Plan Note (Signed)
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  PREVENT trial: [facility] [id number]: Patient is enrolled in this trial atorvastatin versus placebo No side effects related to the study drug.  Treatment summary:  1. Completed 4 cycles of dose dense Adriamycin and Cytoxan; and a 12 cycles ofAbraxanefrom 04/05/2016 to 08/16/2016 2. left lumpectomy 09/12/2016: IDC 0.5 cm, DCIS 1 cm, margins negative, 1/4 lymph nodes positive, T1aN1 stage II a pathologic stage 3. Adjuvant radiation 10/22/2016 to 12/11/2016 4. Adjuvant Xeloda 1500 mg twice daily 2 weeks on one week off started 01/07/2017, increasedthe dose to 2000g by mouth twice a day from2/26/2018 completed 05/27/2017 Patient refused participation [id number] ------------------------------------------------------------------------------------------------------------------------------------ Surveillance: 1. Breast Exam 07/03/2018: Benign 2. Mammogram left breast: 01/01/2018: No abnormalities or masses  I discussed with her about staying active.  She and her husband are going to go an RV in her dream is to go to [facility].  RTC in 1 year

## 2018-07-10 ENCOUNTER — Telehealth: Payer: Self-pay | Admitting: Hematology and Oncology

## 2018-07-10 NOTE — Telephone Encounter (Signed)
Printed medical records for [place], Release ID: [id number]

## 2018-07-14 NOTE — Progress Notes (Signed)
FMLA successfully faxed to [employer] at [phone]. Mailed copy to patient address on file. 

## 2018-07-22 ENCOUNTER — Ambulatory Visit (INDEPENDENT_AMBULATORY_CARE_PROVIDER_SITE_OTHER): Payer: BC Managed Care – PPO | Admitting: Orthopaedic Surgery

## 2018-07-22 ENCOUNTER — Encounter (INDEPENDENT_AMBULATORY_CARE_PROVIDER_SITE_OTHER): Payer: Self-pay | Admitting: Orthopaedic Surgery

## 2018-07-22 VITALS — BP 122/76 | HR 65 | Ht 64.0 in | Wt 195.0 lb

## 2018-07-22 DIAGNOSIS — M5442 Lumbago with sciatica, left side: Secondary | ICD-10-CM | POA: Diagnosis not present

## 2018-07-22 DIAGNOSIS — M25511 Pain in right shoulder: Secondary | ICD-10-CM

## 2018-07-22 DIAGNOSIS — G8929 Other chronic pain: Secondary | ICD-10-CM | POA: Diagnosis not present

## 2018-07-22 MED ORDER — METHYLPREDNISOLONE ACETATE 40 MG/ML IJ SUSP
80.0000 mg | INTRAMUSCULAR | Status: AC | PRN
  Administered 2018-07-22: 80 mg

## 2018-07-22 MED ORDER — LIDOCAINE HCL 2 % IJ SOLN
2.0000 mL | INTRAMUSCULAR | Status: AC | PRN
  Administered 2018-07-22: 2 mL

## 2018-07-22 MED ORDER — BUPIVACAINE HCL 0.5 % IJ SOLN
2.0000 mL | INTRAMUSCULAR | Status: AC | PRN
  Administered 2018-07-22: 2 mL via INTRA_ARTICULAR

## 2018-07-22 NOTE — Progress Notes (Signed)
Office Visit Note   Patient: Alexis Fox           Date of Birth: 06-May-1956           MRN: [id number] Visit Date: 07/22/2018              Requested by: [name], MD [address] [place], [place] [address] PCP: [name], MD   Assessment & Plan: Visit Diagnoses:  1. Chronic right shoulder pain   2. Chronic left-sided low back pain with left-sided sciatica     Plan: Chronic left hip pain in the area of the iliac crest and lateral hip.  Prior x-rays of her pelvis were negative for hip pathology.  Has had chronic problems with her lumbar spine followed by the neurosurgeons.  MRI scan 1 year ago noted prior L5-S1 fusion without complication.  There was some bilateral neuroforaminal narrowing at L4-5.  There is a small right foraminal protrusion at L2-3 and a very shallow broad-based left paracentral protrusion at L3-4 without central canal or foraminal narrowing.  She has had prior injections by Dr. Maryjean Ka.  I injected the area of her pain over the greater trochanter recently without much relief.  She is planning to have another back injection by Dr. Maryjean Ka next week.  I think the problem with her pelvis is related to her back and will be interested to know the results of the injections.  If there is no relief then I would suggest an MRI of her pelvis.  She will let me know. In addition she is had some recent right shoulder pain in the area of the biceps tendon and glenohumeral joint.  I will inject that for both diagnostic and therapeutic purposes.  We will have her return if no improvement and obtain films  Follow-Up Instructions: Return if symptoms worsen or fail to improve.   Orders:  Orders Placed This Encounter  Procedures  . Large Joint Inj: R subacromial bursa   No orders of the defined types were placed in this encounter.     Procedures: Large Joint Inj: R subacromial bursa on 07/22/2018 11:50 AM Indications: pain and diagnostic evaluation Details:  25 G 1.5 in needle, anterolateral approach  Arthrogram: No  Medications: 2 mL lidocaine 2 %; 2 mL bupivacaine 0.5 %; 80 mg methylPREDNISolone acetate 40 MG/ML Consent was given by the patient. Immediately prior to procedure a time out was called to verify the correct patient, procedure, equipment, support staff and site/side marked as required. Patient was prepped and draped in the usual sterile fashion.       Clinical Data: No additional findings.   Subjective: Chief Complaint  Patient presents with  . Follow-up    r shoulder pain since jan off and on. injection in it 5 yrs ago. bi lat hip pain but left is worse had injection and doing PT last 3 weeks not helping just making syptoms worse  Alexis Fox has had chronic pain in the area of the lateral left hip.  It spent indeterminate whether not this is related to her back or to her hip.  Hip films were negative for any obvious pathology.  I injected the area of tenderness with cortisone without much relief.  The issue is whether or not this is referred from her back.  She is been seeing the neurosurgeon for a long period of time and more recently has been followed by Dr. Maryjean Ka first injections.  She is scheduled to have more injections  next week.  Is not having any groin pain or significant thigh discomfort.  She is not having any numbness or tingling into either lower extremity.  She has recently developed some atraumatic pain in her right shoulder difficulty raising her arm over her head.  The pain is localized to the glenohumeral joint and biceps tendon.  No numbness or tingling or skin changes. She has recently lost about 30 pounds and involved with an exercise program and attempt to help her back. Office visit over 30 minutes regarding all of the above 50% of which was counseling regarding treatment options and diagnostic testing in the future  HPI  Review of Systems  Constitutional: Negative for fatigue and fever.  HENT: Negative for  ear pain.   Eyes: Negative for pain.  Respiratory: Negative for cough and shortness of breath.   Cardiovascular: Negative for leg swelling.  Gastrointestinal: Negative for constipation and diarrhea.  Genitourinary: Negative for difficulty urinating.  Musculoskeletal: Positive for back pain. Negative for neck pain.  Skin: Negative for rash.  Allergic/Immunologic: Negative for food allergies.  Neurological: Positive for weakness. Negative for numbness.  Hematological: Does not bruise/bleed easily.  Psychiatric/Behavioral: Positive for sleep disturbance.     Objective: Vital Signs: BP 122/76 (BP Location: Right Arm, Patient Position: Sitting, Cuff Size: Normal)   Pulse 65   Ht 5\' 4"  (1.626 m)   Wt 195 lb (88.5 kg)   BMI 33.47 kg/m   Physical Exam  Constitutional: She is oriented to person, place, and time. She appears well-developed and well-nourished.  HENT:  Mouth/Throat: Oropharynx is clear and moist.  Eyes: Pupils are equal, round, and reactive to light. EOM are normal.  Pulmonary/Chest: Effort normal.  Neurological: She is alert and oriented to person, place, and time.  Skin: Skin is warm and dry.  Psychiatric: She has a normal mood and affect. Her behavior is normal.    Ortho Exam awake alert and oriented x3.  Comfortable sitting.  Painless range of motion of both hips with internal/external rotation.  She could easily do a deep knee bend and squat without any pain in the area of either hip.  She still has some tenderness in the area of her greater trochanter and left iliac crest tickly when she first gets up from a sitting position.  She is had some mild back pain.  No masses.  Discomfort.  Right shoulder exam was minimally positive with impingement testing.  There was some tenderness in the area of the proximal biceps tendon and glenohumeral joint medially.  No crepitation.  Skin intact.  Neurovascular exam intact  Specialty Comments:  No specialty comments  available.  Imaging: No results found.   PMFS History: Patient Active Problem List   Diagnosis Date Noted  . Central line complication 16/96/7893  . Genetic testing 05/15/2016  . Family history of breast cancer   . Family history of brain cancer   . Breast cancer of upper-outer quadrant of left female breast ([place]) 03/20/2016  . Transaminitis 10/03/2015  . Symptomatic cholelithiasis 10/03/2015  . RUQ abdominal pain   . Depression 06/24/2013  . Restless leg syndrome 06/24/2013   Past Medical History:  Diagnosis Date  . Anxiety   . Arthritis    knees  . Breast cancer ([place])   . Breast cancer of upper-outer quadrant of left female breast ([place]) 03/20/2016  . Cholecystitis   . Complication of anesthesia    BP "crashes" post op  . Depression   . Family history of brain  cancer   . Family history of breast cancer   . Hot flashes   . Personal history of radiation therapy   . Restless leg syndrome     Family History  Problem Relation Age of Onset  . Heart disease Mother   . Pulmonary fibrosis Mother   . Hypertension Mother   . Cancer Father 89       astocytoma  . Hypertension Brother   . Lymphoma Maternal Aunt   . Anesthesia problems Neg Hx     Past Surgical History:  Procedure Laterality Date  . BREAST BIOPSY    . BREAST LUMPECTOMY    . BUNIONECTOMY    . CHOLECYSTECTOMY N/A 10/04/2015   Procedure: LAPAROSCOPIC CHOLECYSTECTOMY WITH INTRAOPERATIVE CHOLANGIOGRAM;  Surgeon: Greer Pickerel, MD;  Location: [facility];  Service: General;  Laterality: N/A;  . ELBOW SURGERY     right for epicondylitis 2010   . FOOT SURGERY     1983 -tarsal tunnel release  . IR GENERIC HISTORICAL  10/26/2016   IR CV LINE INJECTION 10/26/2016 [facility]-INTERV RAD  . LUMBAR DISC SURGERY  04/04/2012  . PORT-A-CATH REMOVAL N/A 11/15/2016   Procedure: REMOVAL PORT-A-CATH;  Surgeon: Rolm Bookbinder, MD;  Location: [facility];  Service: General;  Laterality: N/A;  . PORTACATH PLACEMENT Right  04/02/2016   Procedure: INSERTION PORT-A-CATH WITH [place];  Surgeon: Rolm Bookbinder, MD;  Location: [facility];  Service: General;  Laterality: Right;  . RADIOACTIVE SEED GUIDED PARTIAL MASTECTOMY/AXILLARY SENTINEL NODE BIOPSY/AXILLARY NODE DISSECTION Left 09/12/2016   Procedure: LEFT BREAST SEED GUIDED LUMPECTOMY, LEFT AXILLARY SENTINEL NODE BIOPSY, LEFT SEED GUIDED AXILLARY NODE EXCISION( TARGETED AXILLARY DISSECTION), BLUE DYE INJECTION;  Surgeon: Rolm Bookbinder, MD;  Location: [facility];  Service: General;  Laterality: Left;  . SPINAL FUSION  5/12   L5-S1  . TARSAL TUNNEL RELEASE     Social History   Occupational History  . Not on file  Tobacco Use  . Smoking status: Never Smoker  . Smokeless tobacco: Never Used  Substance and Sexual Activity  . Alcohol use: No  . Drug use: No  . Sexual activity: Not Currently    Birth control/protection: Post-menopausal

## 2018-08-19 ENCOUNTER — Telehealth (INDEPENDENT_AMBULATORY_CARE_PROVIDER_SITE_OTHER): Payer: Self-pay | Admitting: Orthopaedic Surgery

## 2018-08-19 NOTE — Telephone Encounter (Signed)
Please advise. Thank you

## 2018-08-19 NOTE — Telephone Encounter (Signed)
Patient called stating Dr. Durward Fortes wanted her to call when she is ready to schedule a pelvic ultrasound.  Patient states she would like to be referred to [facility].  Patient states she had a spinal injection with pain management and it didn't help her hip pain.

## 2018-08-20 ENCOUNTER — Other Ambulatory Visit: Payer: Self-pay | Admitting: *Deleted

## 2018-08-20 ENCOUNTER — Other Ambulatory Visit (INDEPENDENT_AMBULATORY_CARE_PROVIDER_SITE_OTHER): Payer: Self-pay | Admitting: Orthopaedic Surgery

## 2018-08-20 DIAGNOSIS — R102 Pelvic and perineal pain: Secondary | ICD-10-CM

## 2018-08-20 NOTE — Telephone Encounter (Signed)
I called patient, MRI Pelvis w/o ordered at [facility]

## 2018-08-20 NOTE — Telephone Encounter (Signed)
The note says MRI of pelvis. Please confirm

## 2018-09-02 ENCOUNTER — Telehealth (INDEPENDENT_AMBULATORY_CARE_PROVIDER_SITE_OTHER): Payer: Self-pay | Admitting: Orthopaedic Surgery

## 2018-09-02 NOTE — Telephone Encounter (Signed)
Please let me know when you are ready to do a peer-to-peer.

## 2018-09-02 NOTE — Telephone Encounter (Signed)
See below.  Thank you

## 2018-09-02 NOTE — Telephone Encounter (Signed)
tomorrow

## 2018-09-02 NOTE — Telephone Encounter (Signed)
I called patient 

## 2018-09-02 NOTE — Telephone Encounter (Signed)
Patient called checking the status of her peer to peer review for her MRI.  Patient requested a return call to give her information regarding the timeline of what needs to take place.

## 2018-09-08 ENCOUNTER — Encounter (INDEPENDENT_AMBULATORY_CARE_PROVIDER_SITE_OTHER): Payer: Self-pay | Admitting: Orthopaedic Surgery

## 2018-09-11 ENCOUNTER — Ambulatory Visit (HOSPITAL_COMMUNITY)
Admission: RE | Admit: 2018-09-11 | Discharge: 2018-09-11 | Disposition: A | Discharge status: Home / Self Care | Payer: Medicare Other | Source: Ambulatory Visit | Attending: Orthopaedic Surgery | Admitting: Orthopaedic Surgery

## 2018-09-11 DIAGNOSIS — M1611 Unilateral primary osteoarthritis, right hip: Secondary | ICD-10-CM | POA: Insufficient documentation

## 2018-09-11 DIAGNOSIS — M7062 Trochanteric bursitis, left hip: Secondary | ICD-10-CM | POA: Diagnosis not present

## 2018-09-11 DIAGNOSIS — R102 Pelvic and perineal pain: Secondary | ICD-10-CM | POA: Diagnosis present

## 2018-09-15 ENCOUNTER — Encounter (INDEPENDENT_AMBULATORY_CARE_PROVIDER_SITE_OTHER): Payer: Self-pay | Admitting: Orthopaedic Surgery

## 2018-09-15 ENCOUNTER — Encounter: Payer: Self-pay | Admitting: Neurology

## 2018-09-15 ENCOUNTER — Ambulatory Visit (INDEPENDENT_AMBULATORY_CARE_PROVIDER_SITE_OTHER): Payer: Medicare Other | Admitting: Orthopaedic Surgery

## 2018-09-15 ENCOUNTER — Ambulatory Visit (INDEPENDENT_AMBULATORY_CARE_PROVIDER_SITE_OTHER): Payer: Medicare Other | Admitting: Neurology

## 2018-09-15 ENCOUNTER — Ambulatory Visit (INDEPENDENT_AMBULATORY_CARE_PROVIDER_SITE_OTHER): Payer: BC Managed Care – PPO | Admitting: Orthopaedic Surgery

## 2018-09-15 VITALS — BP 118/65 | HR 74 | Resp 16 | Ht 64.0 in | Wt 188.0 lb

## 2018-09-15 VITALS — BP 128/75 | HR 64 | Ht 64.0 in | Wt 194.0 lb

## 2018-09-15 DIAGNOSIS — R5383 Other fatigue: Secondary | ICD-10-CM | POA: Diagnosis not present

## 2018-09-15 DIAGNOSIS — M7062 Trochanteric bursitis, left hip: Secondary | ICD-10-CM | POA: Diagnosis not present

## 2018-09-15 DIAGNOSIS — G4719 Other hypersomnia: Secondary | ICD-10-CM

## 2018-09-15 DIAGNOSIS — M5137 Other intervertebral disc degeneration, lumbosacral region: Secondary | ICD-10-CM | POA: Diagnosis not present

## 2018-09-15 DIAGNOSIS — G2581 Restless legs syndrome: Secondary | ICD-10-CM

## 2018-09-15 DIAGNOSIS — T451X5A Adverse effect of antineoplastic and immunosuppressive drugs, initial encounter: Secondary | ICD-10-CM

## 2018-09-15 DIAGNOSIS — R0683 Snoring: Secondary | ICD-10-CM | POA: Diagnosis not present

## 2018-09-15 DIAGNOSIS — F329 Major depressive disorder, single episode, unspecified: Secondary | ICD-10-CM | POA: Diagnosis not present

## 2018-09-15 DIAGNOSIS — G620 Drug-induced polyneuropathy: Secondary | ICD-10-CM | POA: Insufficient documentation

## 2018-09-15 DIAGNOSIS — M51379 Other intervertebral disc degeneration, lumbosacral region without mention of lumbar back pain or lower extremity pain: Secondary | ICD-10-CM | POA: Insufficient documentation

## 2018-09-15 DIAGNOSIS — F32A Depression, unspecified: Secondary | ICD-10-CM

## 2018-09-15 MED ORDER — PRAMIPEXOLE DIHYDROCHLORIDE 0.5 MG PO TABS: ORAL_TABLET | ORAL | 3 refills | Status: DC

## 2018-09-15 NOTE — Addendum Note (Signed)
Addended by: Larey Seat on: 09/15/2018 01:05 PM   Modules accepted: Orders

## 2018-09-15 NOTE — Patient Instructions (Signed)
Prenatal vitamin to start 2 times daily.  Iron containing foods are preferred.  Take iron with orange juice for easier absorption.   Goal is to get Ferritin level to 50 or above.

## 2018-09-15 NOTE — Progress Notes (Signed)
Office Visit Note   Patient: Alexis Fox           Date of Birth: 12/18/1955           MRN: [id number] Visit Date: 09/15/2018              Requested by: [name], MD [address] [place], [place] [address] PCP: [name], MD   Assessment & Plan: Visit Diagnoses:  1. Greater trochanteric bursitis, left     Plan: MRI scan of pelvis without contrast demonstrates partial tears of bilateral gluteus minimus tendons and a strain of the distal left gluteus medius muscle.  There is mild left greater trochanteric bursitis.  No acute abnormalities or evidence of metastatic disease.  Mild right hip osteoarthritis.  Long discussion regarding the x-ray findings.  The question is whether or not this pain is referred from her back or from the above MRI scan findings.  I will ask Dr. Ernestina Patches to inject the area of her left hip and monitor response.  If her response is poor it is worth having her return to see Dr. Vertell Limber Follow-Up Instructions: Return in about 1 month (around 10/16/2018).   Orders:  Orders Placed This Encounter  Procedures  . Ambulatory referral to Physical Medicine Rehab   No orders of the defined types were placed in this encounter.     Procedures: No procedures performed   Clinical Data: No additional findings.   Subjective: Chief Complaint  Patient presents with  . Pelvis - Follow-up  . Hip Pain    MRI review  Symptoms have not changed.  Having pain in both hips with a level of 8 on the left and 2 on the right.  Also has chronic low back pain.  Has had multiple back injections by Dr. Maryjean Ka.  Pain is a problem at night trying to sleep.  Some discomfort with walking.  Pain is fairly well localized on the left in the area of the greater trochanter.  Has not responded to cortisone.  Thus, the reason for the MRI scan.  Results are as above.  Also on occasion having some pain referred distally into her left leg and foot  HPI  Review of Systems    Constitutional: Positive for fatigue.  HENT: Negative for trouble swallowing.   Eyes: Negative for pain.  Respiratory: Negative for shortness of breath.   Cardiovascular: Negative for leg swelling.  Gastrointestinal: Negative for constipation.  Endocrine: Positive for heat intolerance.  Genitourinary: Negative for difficulty urinating.  Musculoskeletal: Positive for back pain, gait problem and joint swelling.  Skin: Negative for rash.  Allergic/Immunologic: Negative for food allergies.  Neurological: Negative for weakness.  Hematological: Does not bruise/bleed easily.  Psychiatric/Behavioral: Positive for sleep disturbance.     Objective: Vital Signs: BP 118/65 (BP Location: Right Arm, Patient Position: Sitting, Cuff Size: Normal)   Pulse 74   Resp 16   Ht 5\' 4"  (1.626 m)   Wt 188 lb (85.3 kg)   BMI 32.27 kg/m   Physical Exam  Constitutional: She is oriented to person, place, and time. She appears well-developed and well-nourished.  HENT:  Mouth/Throat: Oropharynx is clear and moist.  Eyes: Pupils are equal, round, and reactive to light. EOM are normal.  Pulmonary/Chest: Effort normal.  Neurological: She is alert and oriented to person, place, and time.  Skin: Skin is warm and dry.  Psychiatric: She has a normal mood and affect. Her behavior is normal.    Ortho  Exam awake alert and oriented x3.  Comfortable sitting very slow gait related to her pain.  Some mild yet localized tenderness over the greater trochanter of her left hip.  Painless range of motion both hips with internal and external rotation.  Specialty Comments:  No specialty comments available.  Imaging: No results found.   PMFS History: Patient Active Problem List   Diagnosis Date Noted  . Central line complication 56/43/3295  . Genetic testing 05/15/2016  . Family history of breast cancer   . Family history of brain cancer   . Breast cancer of upper-outer quadrant of left female breast ([facility])  03/20/2016  . Transaminitis 10/03/2015  . Symptomatic cholelithiasis 10/03/2015  . RUQ abdominal pain   . Depression 06/24/2013  . Restless leg syndrome 06/24/2013   Past Medical History:  Diagnosis Date  . Anxiety   . Arthritis    knees  . Breast cancer ([facility])   . Breast cancer of upper-outer quadrant of left female breast ([facility]) 03/20/2016  . Cholecystitis   . Complication of anesthesia    BP "crashes" post op  . Depression   . Family history of brain cancer   . Family history of breast cancer   . Hot flashes   . Personal history of radiation therapy   . Restless leg syndrome     Family History  Problem Relation Age of Onset  . Heart disease Mother   . Pulmonary fibrosis Mother   . Hypertension Mother   . Cancer Father 51       astocytoma  . Hypertension Brother   . Lymphoma Maternal Aunt   . Anesthesia problems Neg Hx     Past Surgical History:  Procedure Laterality Date  . BREAST BIOPSY    . BREAST LUMPECTOMY    . BUNIONECTOMY    . CHOLECYSTECTOMY N/A 10/04/2015   Procedure: LAPAROSCOPIC CHOLECYSTECTOMY WITH INTRAOPERATIVE CHOLANGIOGRAM;  Surgeon: Greer Pickerel, MD;  Location: [facility];  Service: General;  Laterality: N/A;  . ELBOW SURGERY     right for epicondylitis 2010   . FOOT SURGERY     1983 -tarsal tunnel release  . IR GENERIC HISTORICAL  10/26/2016   IR CV LINE INJECTION 10/26/2016 [facility]  . LUMBAR DISC SURGERY  04/04/2012  . PORT-A-CATH REMOVAL N/A 11/15/2016   Procedure: REMOVAL PORT-A-CATH;  Surgeon: Rolm Bookbinder, MD;  Location: [facility];  Service: General;  Laterality: N/A;  . PORTACATH PLACEMENT Right 04/02/2016   Procedure: INSERTION PORT-A-CATH WITH [place];  Surgeon: Rolm Bookbinder, MD;  Location: [facility];  Service: General;  Laterality: Right;  . RADIOACTIVE SEED GUIDED PARTIAL MASTECTOMY/AXILLARY SENTINEL NODE BIOPSY/AXILLARY NODE DISSECTION Left 09/12/2016   Procedure: LEFT BREAST SEED GUIDED LUMPECTOMY,  LEFT AXILLARY SENTINEL NODE BIOPSY, LEFT SEED GUIDED AXILLARY NODE EXCISION( TARGETED AXILLARY DISSECTION), BLUE DYE INJECTION;  Surgeon: Rolm Bookbinder, MD;  Location: [facility];  Service: General;  Laterality: Left;  . SPINAL FUSION  5/12   L5-S1  . TARSAL TUNNEL RELEASE     Social History   Occupational History  . Not on file  Tobacco Use  . Smoking status: Never Smoker  . Smokeless tobacco: Never Used  Substance and Sexual Activity  . Alcohol use: Yes    Comment: rarely  . Drug use: No  . Sexual activity: Not Currently    Birth control/protection: Post-menopausal

## 2018-09-15 NOTE — Progress Notes (Addendum)
SLEEP MEDICINE CLINIC   Provider:  Larey Seat, M.D.  Primary Care Physician:  [name], MD   Referring Provider: [name], MD    Chief Complaint  Patient presents with  . New Patient (Initial Visit)    pt alone, rm 10. pt states that she has seen Dr Brett Fairy long time ago and Dr Avrum Kimball diagnosed her with RLS at that time.  she comes today to discuss her RLS symptoms. last sleep study completed in 2014.     HPI:  Alexis Fox is a 62 y.o. female patient  , seen here on 09-15-2018  in a referral/ revisit from Dr. Laurann Montana for restless legs , having increased in intensity.    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  She has had restless leg symptoms for most of her life, she was last seen here 2014 after a sleep study had been obtained through [facility], at the time ordered by Dr. [name] and interpreted by Katherine Roan, she did have somewhat fragmented sleep at the time but there was no significant apnea noted she did snore loudly but did not wake up from snoring, she did not have periodic limb movements at the time and her sleep architecture revealed a delayed REM sleep onset and lower REM sleep proportion which was not surprising as she was at the time taking clonazepam citalopram and pramipexole as well as bupropion.  Her current symptoms include memory loss, confusion, headaches, insomnia which has been present since 1998, daytime sleepiness, loud snoring and restless legs have resurfaced by liver for a while successfully treated.  She does not have a diagnosis of an anxiety disorder question if PTSD, chronic back pain breast cancer diagnosed in April 2017, lumpectomy 2017, fusion back surgery L5-S1 in May 2013,  laparoscopic cholecystectomy in 2016 January, facet injections bilateral at L4-5.  Sleep habits are as follows: dinner time is at 7 Pm, and usually bedtime for her at 10-11 Pm, the bedroom is cool, quiet and dark. Husband comes later to bed, but doesn't wake up. Back and hip pain can interrupt her sleep. RLS set in about 4 PM on a very busy day- her legs are uncomfortable.  Her sleep is good while on medication ( takes Ambien - prn). One nocturia each night - goes back to sleep. Sleeps in prone position( due to pain) and one pillow to support her stomach- arms are to her sides, head turned to her right.  Was a left sided sleeper before back and hip pain.  Rises at 7 or 11.30- variable each day depending on medication - if she gets up early she will walk the dog , returns to breakfast and falls asleep in her chair.     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  Sister has RLS , too and her son may have it.  Mother had sick sinus and pulmonary fibrosis. Father HTN, cholesterolemia, and astrocytoma / malignant tumor of the brain.    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   Review of Systems: Out of a complete 14 system review, the patient complains of only the following symptoms, and all other reviewed systems are negative. RLS, chronic pain and insomnia related to pain, sleep latency treated with Ambien( Dr Laurann Montana). Snoring. Dry mout, dry eyes. Antihistamines- may cause memory problems. Marland Kitchen  Epworth sleepiness score 12-14 / 24  , Fatigue severity  score 51/ 63  , depression score highly endorsed at 7 / 15 points.   Social History   Socioeconomic History  . Marital status: Married    Spouse name: Not on file  . Number of children: 2  . Years of education: Not on file  . Highest education level: Not on file  Occupational History  . Not on file  Social Needs  . Financial resource strain: Not on file  . Food insecurity:    Worry: Not on file    Inability: Not on file  . Transportation needs:    Medical: Not on file    Non-medical: Not on file  Tobacco Use  . Smoking status: Never Smoker  . Smokeless tobacco: Never Used  Substance and Sexual Activity  . Alcohol use: Yes    Comment: rarely 6 beer a year  . Drug use: No  . Sexual activity: Not Currently    Birth control/protection: Post-menopausal  Lifestyle  . Physical activity:    Days per week: Not on file    Minutes per session: Not on file  . Stress: Not on file  Relationships  . Social connections:    Talks on phone: Not on file    Gets together: Not on file    Attends religious service: Not on file    Active member of club or organization: Not on file    Attends meetings of clubs or organizations: Not on file    Relationship status: Not on file  . Intimate partner violence:    Fear of current or ex partner: Not on file    Emotionally abused: Not on file    Physically abused: Not on file    Forced sexual activity: Not on file  Other Topics Concern  . Not on file  Social History Narrative  . Not on file    Family History  Problem Relation Age of Onset  . Heart disease Mother   . Pulmonary fibrosis Mother   . Hypertension Mother   . Cancer Father 48       astocytoma  . Hypertension Brother   . Lymphoma Maternal Aunt   . Anesthesia problems Neg Hx     Past Medical History:  Diagnosis Date  . Anxiety   . Arthritis    knees  . Breast cancer ([facility])   . Breast cancer of upper-outer quadrant of left female breast ([facility]) 03/20/2016  . Cholecystitis     . Complication of anesthesia    BP "crashes" post op  . Depression   . Family history of brain cancer   . Family history of breast cancer   . Hot flashes   . Personal history of radiation therapy   . Restless leg syndrome     Past Surgical History:  Procedure Laterality Date  . BREAST BIOPSY    .  BREAST LUMPECTOMY    . BUNIONECTOMY    . CHOLECYSTECTOMY N/A 10/04/2015   Procedure: LAPAROSCOPIC CHOLECYSTECTOMY WITH INTRAOPERATIVE CHOLANGIOGRAM;  Surgeon: Greer Pickerel, MD;  Location: [facility];  Service: General;  Laterality: N/A;  . ELBOW SURGERY     right for epicondylitis 2010   . FOOT SURGERY     1983 -tarsal tunnel release  . IR GENERIC HISTORICAL  10/26/2016   IR CV LINE INJECTION 10/26/2016 [facility]  . LUMBAR DISC SURGERY  04/04/2012  . PORT-A-CATH REMOVAL N/A 11/15/2016   Procedure: REMOVAL PORT-A-CATH;  Surgeon: Rolm Bookbinder, MD;  Location: [facility];  Service: General;  Laterality: N/A;  . PORTACATH PLACEMENT Right 04/02/2016   Procedure: INSERTION PORT-A-CATH WITH [place];  Surgeon: Rolm Bookbinder, MD;  Location: [facility];  Service: General;  Laterality: Right;  . RADIOACTIVE SEED GUIDED PARTIAL MASTECTOMY/AXILLARY SENTINEL NODE BIOPSY/AXILLARY NODE DISSECTION Left 09/12/2016   Procedure: LEFT BREAST SEED GUIDED LUMPECTOMY, LEFT AXILLARY SENTINEL NODE BIOPSY, LEFT SEED GUIDED AXILLARY NODE EXCISION( TARGETED AXILLARY DISSECTION), BLUE DYE INJECTION;  Surgeon: Rolm Bookbinder, MD;  Location: [facility];  Service: General;  Laterality: Left;  . SPINAL FUSION  5/12   L5-S1  . TARSAL TUNNEL RELEASE      Current Outpatient Medications  Medication Sig Dispense Refill  . buPROPion (WELLBUTRIN XL) 150 MG 24 hr tablet Take 150 mg by mouth daily.  4  . cetirizine (ZYRTEC ALLERGY) 10 MG tablet Take 10 mg by mouth daily.    . Cholecalciferol (VITAMIN D3 PO) Take by mouth 3 (three) times a week.    . citalopram (CELEXA) 40 MG  tablet   3  . clonazePAM (KLONOPIN) 1 MG tablet Take 1 mg by mouth at bedtime.     Marland Kitchen esomeprazole (NEXIUM) 20 MG capsule Take 1 capsule (20 mg total) by mouth daily at 12 noon.    . ferrous sulfate 325 (65 FE) MG EC tablet Take 325 mg by mouth at bedtime.     . fluticasone (FLONASE) 50 MCG/ACT nasal spray     . NONFORMULARY OR COMPOUNDED ITEM [facility]:  Antiinflammatory Cream - Diclofenac 35, Baclofen 2%, Lidocaine 2%, apply 1-2 grams to affected area 3-4 times daily. 120 each 2  . nystatin-triamcinolone ointment (MYCOLOG) Apply 1 application topically 2 (two) times daily. 60 g 3  . pramipexole (MIRAPEX) 0.5 MG tablet Take 0.5 mg by mouth at bedtime.    Marland Kitchen zolpidem (AMBIEN) 5 MG tablet Take 5 mg by mouth at bedtime as needed for sleep.     No current facility-administered medications for this visit.    Facility-Administered Medications Ordered in Other Visits  Medication Dose Route Frequency Provider Last Rate Last Dose  . sodium chloride flush (NS) 0.9 % injection 10 mL  10 mL Intracatheter PRN Nicholas Lose, MD   10 mL at 07/19/16 1434    Allergies as of 09/15/2018 - Review Complete 09/15/2018  Allergen Reaction Noted  . Turmeric Other (See Comments) 11/15/2016  . Decadron [dexamethasone]  09/12/2016    Vitals: BP 128/75   Pulse 64   Ht 5\' 4"  (1.626 m)   Wt 194 lb (88 kg)   BMI 33.30 kg/m  Last Weight:  Wt Readings from Last 1 Encounters:  09/15/18 194 lb (88 kg)   OVZ:CHYI mass index is 33.3 kg/m.     Last Height:   Ht Readings from Last 1 Encounters:  09/15/18 5\' 4"  (1.626 m)    Physical exam:  General:  The patient is awake, alert and appears not in acute distress. The patient is well groomed. Head: Normocephalic, atraumatic. Neck is supple. Mallampati 1-2 , wide open.  neck circumference:14". Nasal airflow patent - Retrognathia is not seen. Wore braces and retainers. Cardiovascular:  Regular rate and rhythm  without  murmurs or carotid bruit, and without  distended neck veins. Respiratory: Lungs are clear to auscultation. Skin:  Without evidence of edema, or rash Trunk: BMI is 33.3 . The patient's posture is erect   Neurologic exam : The patient is awake and alert, oriented to place and time.   Memory subjective  described as impaired . Attention span & concentration ability appears normal.  Speech is fluent,  without  dysarthria, dysphonia or aphasia.  Mood and affect are appropriate.  Cranial nerves: Pupils are equal and briskly reactive to light. Funduscopic exam without evidence of abnormalities. . Extraocular movements  in vertical and horizontal planes intact and without nystagmus. Visual fields by finger perimetry are intact. Hearing to finger rub intact.   Facial sensation intact to fine touch.  Facial motor strength is symmetric and tongue and uvula move midline. Shoulder shrug was symmetrical.   Motor exam:   Normal tone, muscle bulk and symmetric strength in all extremities. Sensory:  Fine touch, pinprick and vibration were tested in all extremities.  There is bilaterally reduced sensitivity to vibration- on lateral ankle. Proprioception tested in the upper extremities was normal. Coordination: Rapid alternating movements in the fingers/hands was normal.  Finger-to-nose maneuver  normal without evidence of ataxia, dysmetria or tremor. Gait and station: Patient walks without assistive device -and is able unassisted to climb up to the exam table. Back and hi pain affect her ability to climb and to descent.  Strength within normal limits.Stance is stable and normal. Toe and heel stand were tested .Tandem gait is unfragmented. Turns with 4 Steps.  Deep tendon reflexes: in the upper and lower extremities are symmetrically attenuated  and intact.  Babinski maneuver response is downgoing.   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  She is receiving facette injections and has hd at  5 levels lower vertebral fusion ( L4-5 , L3-4 , and L5 S1) . I will leave this to her pain and spine doctor.   2) Chemotherapy neuropathy is another differential dx.- EMG and NCV.   3) I will check the iron, ferritin and saturation levels. She is supplementing vit D, TSH, sed rate, serum protein electro- pheresis, ANA, ACE.   4) dry eyes, mouth and memory- I asked the patient to stop anticholinergic medications.     The patient was advised of the nature of the diagnosed disorder , the treatment options and the  risks for general health and wellness arising from not treating the condition.   I spent more than 45 minutes of face to face time with the patient.  Greater than 50% of time was spent in counseling and coordination of care. We have discussed the diagnosis and differential and I answered the patient's questions.    Plan:  Treatment plan and additional workup : Above named tests ordered.  Medication increased.  I will order mirapex now at higher  dose and still has 1 mg klonopin available.  She needs a daytime medication- and she  Is not opposed to a low dose dopaminergic agonist. It may be beneficial to await d/c of anticholinergic antihistamines to see their effect on RLS.   I ordered an attended sleep study , [facility] [place] [facility] , to evaluate fatigue and snoring.   I ask for RV with NP if sleep study is negative for OSA, and to perform a MMSE / [place] at that visit.     Larey Seat, MD 21/74/7159, 53:96 PM  Certified in Neurology by ABPN Certified in [place] by [facility] [address] [place], [place] [address]

## 2018-09-16 ENCOUNTER — Other Ambulatory Visit (INDEPENDENT_AMBULATORY_CARE_PROVIDER_SITE_OTHER): Payer: Self-pay | Admitting: Orthopaedic Surgery

## 2018-09-16 MED ORDER — HYDROCODONE-ACETAMINOPHEN 5-325 MG PO TABS: 1.0000 | ORAL_TABLET | Freq: Four times a day (QID) | ORAL | 0 refills | Status: DC | PRN

## 2018-09-16 NOTE — Telephone Encounter (Signed)
Sent via imprivata-please call pt

## 2018-09-17 ENCOUNTER — Telehealth: Payer: Self-pay

## 2018-09-17 NOTE — Telephone Encounter (Signed)
I called pt and discussed her lab results with her. She understands that the protein electrophoresis is still pending. Pt verbalized understanding of results. Pt had no questions at this time but was encouraged to call back if questions arise.  Pt reports that she has a "mild finite tremor" in her right hand. She just wanted Dr. Brett Fairy to know this and does not need a call back.   I advised Dr. Brett Fairy of this verbally and Dr. Brett Fairy will address this at pt's next visit.

## 2018-09-17 NOTE — Telephone Encounter (Signed)
-----   Message from Larey Seat, MD sent at 09/16/2018  4:53 PM EDT ----- Negative ANA, normal thyroid hormones, normal iron load and ferritin level. protein electrophoresis is pending.

## 2018-09-18 LAB — PROTEIN ELECTROPHORESIS, SERUM
A/G Ratio: 1.2 (ref 0.7–1.7)
Albumin ELP: 3.8 g/dL (ref 2.9–4.4)
Alpha 1: 0.1 g/dL (ref 0.0–0.4)
Alpha 2: 0.8 g/dL (ref 0.4–1.0)
Beta: 1.2 g/dL (ref 0.7–1.3)
Gamma Globulin: 1 g/dL (ref 0.4–1.8)
Globulin, Total: 3.1 g/dL (ref 2.2–3.9)
Total Protein: 6.9 g/dL (ref 6.0–8.5)

## 2018-09-18 LAB — IRON AND TIBC
Iron Saturation: 19 % (ref 15–55)
Iron: 63 ug/dL (ref 27–139)
Total Iron Binding Capacity: 332 ug/dL (ref 250–450)
UIBC: 269 ug/dL (ref 118–369)

## 2018-09-18 LAB — TSH+FREE T4
Free T4: 0.98 ng/dL (ref 0.82–1.77)
TSH: 2.68 u[IU]/mL (ref 0.450–4.500)

## 2018-09-18 LAB — SEDIMENTATION RATE: Sed Rate: 11 mm/hr (ref 0–40)

## 2018-09-18 LAB — ANA: Anti Nuclear Antibody(ANA): NEGATIVE

## 2018-09-18 LAB — FERRITIN: Ferritin: 85 ng/mL (ref 15–150)

## 2018-09-19 ENCOUNTER — Telehealth: Payer: Self-pay | Admitting: Neurology

## 2018-09-19 NOTE — Telephone Encounter (Signed)
-----   Message from Larey Seat, MD sent at 09/18/2018  2:20 PM EDT ----- No abnormalities of protein/ immunoglobulin e phoresis.

## 2018-09-19 NOTE — Telephone Encounter (Signed)
Called the patient and made her aware that the protein was normal and all the other lab work was normal as well. Pt verbalized understanding. Pt had no questions at this time but was encouraged to call back if questions arise.

## 2018-09-26 ENCOUNTER — Ambulatory Visit (INDEPENDENT_AMBULATORY_CARE_PROVIDER_SITE_OTHER): Payer: Self-pay

## 2018-09-26 ENCOUNTER — Ambulatory Visit (INDEPENDENT_AMBULATORY_CARE_PROVIDER_SITE_OTHER): Payer: Medicare Other | Admitting: Physical Medicine and Rehabilitation

## 2018-09-26 ENCOUNTER — Encounter (INDEPENDENT_AMBULATORY_CARE_PROVIDER_SITE_OTHER): Payer: Self-pay | Admitting: Physical Medicine and Rehabilitation

## 2018-09-26 DIAGNOSIS — M7062 Trochanteric bursitis, left hip: Secondary | ICD-10-CM

## 2018-09-26 DIAGNOSIS — M25552 Pain in left hip: Secondary | ICD-10-CM

## 2018-09-26 NOTE — Progress Notes (Signed)
   Alexis Fox - 62 y.o. female MRN [id number]  Date of birth: January 01, 1956  Office Visit Note: Visit Date: 09/26/2018 PCP: [name], MD Referred by: [name], MD  Subjective: Chief Complaint  Patient presents with  . Left Hip - Pain   HPI:  Alexis Fox is a 62 y.o. female who comes in today At the request of Dr. Joni Fears for left diagnostic and hopefully therapeutic greater trochanteric bursa injection using fluoroscopic guidance to to body habitus.  Patient is having left lateral hip pain worse with laying on that side.  She has a spine history and sees Dr. Vertell Limber.  We will see how she does over the next few weeks and follow-up with Dr. Durward Fortes.  ROS Otherwise per HPI.  Assessment & Plan: Visit Diagnoses:  1. Pain in left hip   2. Greater trochanteric bursitis of left hip     Plan: No additional findings.   Meds & Orders: No orders of the defined types were placed in this encounter.   Orders Placed This Encounter  Procedures  . Large Joint Inj: L greater trochanter  . XR C-ARM NO REPORT    Follow-up: Return if symptoms worsen or fail to improve.   Procedures: Large Joint Inj: L greater trochanter on 09/26/2018 9:13 AM Indications: pain and diagnostic evaluation Details: 22 G 3.5 in needle, fluoroscopy-guided lateral approach  Arthrogram: No  Medications: 4 mL lidocaine 2 %; 80 mg triamcinolone acetonide 40 MG/ML; 4 mL bupivacaine 0.25 % Outcome: tolerated well, no immediate complications  There was excellent flow of contrast outlined the greater trochanteric bursa without vascular uptake. Procedure, treatment alternatives, risks and benefits explained, specific risks discussed. Consent was given by the patient. Immediately prior to procedure a time out was called to verify the correct patient, procedure, equipment, support staff and site/side marked as required. Patient was prepped and draped in the usual sterile fashion.      No notes on file    Clinical History: No specialty comments available.     Objective:  VS:  HT:    WT:   BMI:     BP:   HR: bpm  TEMP: ( )  RESP:  Physical Exam  Ortho Exam Imaging: No results found.

## 2018-09-26 NOTE — Progress Notes (Signed)
  Numeric Pain Rating Scale and Functional Assessment Average Pain 5   In the last MONTH (on 0-10 scale) has pain interfered with the following?  1. General activity like being  able to carry out your everyday physical activities such as walking, climbing stairs, carrying groceries, or moving a chair?  Rating(8)   -Dye Allergies.  

## 2018-09-26 NOTE — Patient Instructions (Signed)

## 2018-09-28 ENCOUNTER — Ambulatory Visit (INDEPENDENT_AMBULATORY_CARE_PROVIDER_SITE_OTHER): Payer: Medicare Other | Admitting: Neurology

## 2018-09-28 DIAGNOSIS — G2581 Restless legs syndrome: Secondary | ICD-10-CM

## 2018-09-28 DIAGNOSIS — G4719 Other hypersomnia: Secondary | ICD-10-CM

## 2018-09-28 DIAGNOSIS — R5383 Other fatigue: Secondary | ICD-10-CM

## 2018-09-28 DIAGNOSIS — G4733 Obstructive sleep apnea (adult) (pediatric): Secondary | ICD-10-CM | POA: Diagnosis not present

## 2018-09-28 DIAGNOSIS — G620 Drug-induced polyneuropathy: Secondary | ICD-10-CM

## 2018-09-28 DIAGNOSIS — F329 Major depressive disorder, single episode, unspecified: Secondary | ICD-10-CM

## 2018-09-28 DIAGNOSIS — M5137 Other intervertebral disc degeneration, lumbosacral region: Secondary | ICD-10-CM

## 2018-09-28 DIAGNOSIS — T451X5A Adverse effect of antineoplastic and immunosuppressive drugs, initial encounter: Secondary | ICD-10-CM

## 2018-09-28 DIAGNOSIS — F32A Depression, unspecified: Secondary | ICD-10-CM

## 2018-09-28 DIAGNOSIS — R0683 Snoring: Secondary | ICD-10-CM

## 2018-09-30 DIAGNOSIS — M47816 Spondylosis without myelopathy or radiculopathy, lumbar region: Secondary | ICD-10-CM | POA: Diagnosis not present

## 2018-10-02 NOTE — Procedures (Signed)
PATIENT'S NAME:  Alexis Fox, Alexis Fox DOB:      15-Aug-1956      MR#:    [id number]     DATE OF RECORDING: 09/28/2018 REFERRING M.D.:  Alexis Orn, MD. 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  Her current symptoms include memory loss, confusion, headaches, insomnia which has been present since 1998, she reports excessive daytime sleepiness, loud snoring and 4 pm onset of restless legs.  She does not have a diagnosis of an anxiety disorder but raised a question of having PTSD, chronic back pain, breast cancer diagnosed in April 2017, lumpectomy 2017, fusion back surgery L5-S1 in May 2013, laparoscopic cholecystectomy in 2016 January, she recently received facet injections bilateral at L4-5.  She has had restless leg symptoms for most of her life. She was last seen here 2014 after a sleep study had been obtained elsewhere, at the time ordered by Dr. Lavone Fox and interpreted by Alexis Fox. She did have fragmented sleep at the time but there was no significant apnea noted, she did snore loudly but did not wake up from snoring, she did not have periodic limb movements at the time and her sleep architecture revealed a delayed REM sleep onset and lower REM sleep proportion which was not surprising as she was at the time taking clonazepam Citalopram and Pramipexole as well as Bupropion.   The patient endorsed the Epworth Sleepiness Scale at 12/24 points.   The patient's weight 194 pounds with a height of 64 (inches), resulting in a BMI of 33.1 kg/m2. The patient's neck circumference measured 14 inches.  CURRENT MEDICATIONS: Bupropion, Cetirizine, Cholecalciferol, Citalopram, Clonazepam, Esomeprazole, Ferrous Sulfate, Fluticasone, Nystatin-Triamcinolone ointment, Pramipexole and Zolpidem.   PROCEDURE:  This is a multichannel  digital polysomnogram utilizing the Somnostar 11.2 system.  Electrodes and sensors were applied and monitored per AASM Specifications.   EEG, EOG, Chin and Limb EMG, were sampled at 200 Hz.  ECG, Snore and Nasal Pressure, Thermal Airflow, Respiratory Effort, CPAP Flow and Pressure, Oximetry was sampled at 50 Hz. Digital video and audio were recorded.      BASELINE STUDY: Lights Out was at 21:16 and Lights On at 05:01.  Total recording time (TRT) was 466 minutes, with a total sleep time (TST) of 337 minutes.  The patient's sleep latency was 48.5 minutes.  REM latency was 190 minutes.  The sleep efficiency was 72.3 %.     SLEEP ARCHITECTURE: WASO (Wake after sleep onset) was 80.5 minutes.  There were 12 minutes in Stage N1, 252 minutes Stage N2, 10.5 minutes Stage N3 and 62.5 minutes in Stage REM.  The percentage of Stage N1 was 3.6%, Stage N2 was 74.8%, Stage N3 was 3.1% and Stage R (REM sleep) was 18.5%.   RESPIRATORY ANALYSIS:  There were a total of 108 respiratory events:  25 obstructive apneas, 83 hypopneas with a hypopnea index of 14.8 /hour.    The total APNEA/HYPOPNEA INDEX (AHI) was 19.2/hour.  28 events occurred in REM sleep and 130 events in NREM. The REM AHI was 26.9 /hour, versus a non-REM AHI of 17.5. The patient spent 180 minutes of total sleep time in the supine position and 157 minutes in non-supine. The supine AHI was 24.6 versus a non-supine AHI of 12.9.  OXYGEN SATURATION & C02:  [facility] baseline 02  saturation was 96%, with the lowest being 75%. Time spent below 89% saturation equaled 17 minutes.   PERIODIC LIMB MOVEMENTS:  The arousals were noted as: 33 were spontaneous, 0 were associated with PLMs, and 108 were associated with respiratory events. The patient had a total of 0 Periodic Limb Movements.   Audio and video analysis did not show any abnormal or unusual movements, behaviors, phonations or vocalizations.    The patient took two bathroom breaks.  Only mild Snoring was  noted.  EKG was in keeping with normal sinus rhythm (NSR).  Post-study, the patient indicated that sleep was of poor quality, the same as usual.    IMPRESSION:  1. Moderate Obstructive Sleep Apnea (OSA) at AHI 19.2, REM accentuated AHI 26.9/h and the supine AHI was 24.6/h.  2. There was no prolonged oxygen desaturation noted, nor PLMs.    RECOMMENDATIONS:  1. Advise full-night, attended, CPAP titration study to optimize therapy.     I certify that I have reviewed the entire raw data recording prior to the issuance of this report in accordance with the Standards of Accreditation of the American Academy of Sleep Medicine (AASM)      Alexis Seat, MD   10-02-2018  Diplomat, American Board of Psychiatry and Neurology  Diplomat, American Board of [place] Director, [facility] [employer]

## 2018-10-03 DIAGNOSIS — G4730 Sleep apnea, unspecified: Secondary | ICD-10-CM

## 2018-10-03 HISTORY — DX: Sleep apnea, unspecified: G47.30

## 2018-10-03 MED ORDER — BUPIVACAINE HCL 0.25 % IJ SOLN
4.0000 mL | INTRAMUSCULAR | Status: AC | PRN
  Administered 2018-09-26: 4 mL via INTRA_ARTICULAR

## 2018-10-03 MED ORDER — TRIAMCINOLONE ACETONIDE 40 MG/ML IJ SUSP
80.0000 mg | INTRAMUSCULAR | Status: AC | PRN
  Administered 2018-09-26: 80 mg via INTRA_ARTICULAR

## 2018-10-03 MED ORDER — LIDOCAINE HCL 2 % IJ SOLN
4.0000 mL | INTRAMUSCULAR | Status: AC | PRN
  Administered 2018-09-26: 4 mL

## 2018-10-06 ENCOUNTER — Telehealth: Payer: Self-pay | Admitting: Neurology

## 2018-10-06 NOTE — Telephone Encounter (Signed)
-----   Message from Larey Seat, MD sent at 10/02/2018 12:57 PM EDT ----- IMPRESSION:  1. Moderate Obstructive Sleep Apnea (OSA) at AHI 19.2, REM  accentuated AHI 26.9/h and the supine AHI was 24.6/h.  2. There was no prolonged oxygen desaturation noted, nor PLMs.    RECOMMENDATIONS:  1. Advise full-night, attended, CPAP titration study to optimize  therapy. I placed an order in [place], CD

## 2018-10-06 NOTE — Telephone Encounter (Signed)
I called pt. I advised pt that Dr. Dohmeier reviewed their sleep study results and found that has moderate sleep apnea and recommends that pt be treated with a cpap. Dr. Dohmeier recommends that pt return for a repeat sleep study in order to properly titrate the cpap and ensure a good mask fit. Pt is agreeable to returning for a titration study. I advised pt that our sleep lab will file with pt's insurance and call pt to schedule the sleep study when we hear back from the pt's insurance regarding coverage of this sleep study. Pt verbalized understanding of results. Pt had no questions at this time but was encouraged to call back if questions arise.  .   

## 2018-10-07 DIAGNOSIS — M47816 Spondylosis without myelopathy or radiculopathy, lumbar region: Secondary | ICD-10-CM | POA: Diagnosis not present

## 2018-10-13 ENCOUNTER — Ambulatory Visit (INDEPENDENT_AMBULATORY_CARE_PROVIDER_SITE_OTHER): Payer: Medicare Other | Admitting: Neurology

## 2018-10-13 ENCOUNTER — Encounter: Payer: Self-pay | Admitting: Neurology

## 2018-10-13 ENCOUNTER — Ambulatory Visit: Payer: Medicare Other | Admitting: Neurology

## 2018-10-13 DIAGNOSIS — F329 Major depressive disorder, single episode, unspecified: Secondary | ICD-10-CM

## 2018-10-13 DIAGNOSIS — G620 Drug-induced polyneuropathy: Secondary | ICD-10-CM

## 2018-10-13 DIAGNOSIS — G2581 Restless legs syndrome: Secondary | ICD-10-CM

## 2018-10-13 DIAGNOSIS — F32A Depression, unspecified: Secondary | ICD-10-CM

## 2018-10-13 DIAGNOSIS — T451X5A Adverse effect of antineoplastic and immunosuppressive drugs, initial encounter: Secondary | ICD-10-CM

## 2018-10-13 DIAGNOSIS — M5137 Other intervertebral disc degeneration, lumbosacral region: Secondary | ICD-10-CM | POA: Diagnosis not present

## 2018-10-13 DIAGNOSIS — R5383 Other fatigue: Secondary | ICD-10-CM

## 2018-10-13 NOTE — Progress Notes (Signed)
[facility]    Nerve / Sites Muscle Latency Ref. Amplitude Ref. Rel Amp Segments Distance Velocity Ref. Area    ms ms mV mV %  cm m/s m/s mVms  R Peroneal - EDB     Ankle EDB 5.6 ?6.5 4.9 ?2.0 100 Ankle - EDB 9   15.7     Fib head EDB 11.2  4.5  91.4 Fib head - Ankle 29 52 ?44 15.7     Pop fossa EDB 13.4  3.9  86.7 Pop fossa - Fib head 10 46 ?44 14.2         Pop fossa - Ankle      L Peroneal - EDB     Ankle EDB 5.4 ?6.5 3.0 ?2.0 100 Ankle - EDB 9   13.4     Fib head EDB 11.8  3.0  101 Fib head - Ankle 30 46 ?44 14.1     Pop fossa EDB 14.0  2.7  89.3 Pop fossa - Fib head 10 46 ?44 12.7         Pop fossa - Ankle      R Tibial - AH     Ankle AH 4.7 ?5.8 10.2 ?4.0 100 Ankle - AH 9   23.6     Pop fossa AH 12.4  7.2  70.7 Pop fossa - Ankle 34 44 ?41 19.7  L Tibial - AH     Ankle AH 4.1 ?5.8 10.2 ?4.0 100 Ankle - AH 9   29.2     Pop fossa AH 12.3  7.1  69 Pop fossa - Ankle 34 41 ?41 25.4             SNC    Nerve / Sites Rec. Site Peak Lat Ref.  Amp Ref. Segments Distance    ms ms V V  cm  R Sural - Ankle (Calf)     Calf Ankle 3.5 ?4.4 10 ?6 Calf - Ankle 14  L Sural - Ankle (Calf)     Calf Ankle 3.6 ?4.4 8 ?6 Calf - Ankle 14  R Superficial peroneal - Ankle     Lat leg Ankle 3.8 ?4.4 8 ?6 Lat leg - Ankle 14  L Superficial peroneal - Ankle     Lat leg Ankle 4.2 ?4.4 7 ?6 Lat leg - Ankle 14              F  Wave    Nerve F Lat Ref.   ms ms  R Tibial - AH 48.4 ?56.0  L Tibial - AH 48.8 ?56.0

## 2018-10-13 NOTE — Procedures (Signed)
     HISTORY:  Alexis Fox is a 62 year old patient with prior lumbosacral spine surgery at the L5-S1 level and a history of breast cancer followed by chemotherapy.  The patient has some residual numbness in the lateral aspect of the right foot.  She does have some back pain and some pain going down the right leg to the knee, occasionally similar pain on the left.  The patient is being evaluated for a possible neuropathy or a lumbosacral radiculopathy.  NERVE CONDUCTION STUDIES:  Nerve conduction studies were performed on both lower extremities. The distal motor latencies and motor amplitudes for the peroneal and posterior tibial nerves were within normal limits. The nerve conduction velocities for these nerves were also normal. The sensory latencies for the peroneal and sural nerves were within normal limits. The F wave latencies for the posterior tibial nerves were within normal limits.   EMG STUDIES:  EMG study was performed on the left lower extremity:  The tibialis anterior muscle reveals 2 to 4K motor units with full recruitment. No fibrillations or positive waves were seen. The peroneus tertius muscle reveals 2 to 4K motor units with full recruitment. No fibrillations or positive waves were seen. The medial gastrocnemius muscle reveals 1 to 3K motor units with full recruitment. No fibrillations or positive waves were seen. The vastus lateralis muscle reveals 2 to 4K motor units with full recruitment. No fibrillations or positive waves were seen. The iliopsoas muscle reveals 2 to 4K motor units with full recruitment. No fibrillations or positive waves were seen. The biceps femoris muscle (long head) reveals 2 to 4K motor units with full recruitment. No fibrillations or positive waves were seen. The lumbosacral paraspinal muscles were tested at 3 levels, and revealed no abnormalities of insertional activity at all 3 levels tested. There was good relaxation.   IMPRESSION:  Nerve  conduction studies done on both lower extremities were within normal limits.  No evidence of a peripheral neuropathy is seen.  A small fiber neuropathy can be missed by standard nerve conduction studies however, clinical correlation is required.  EMG evaluation of the right lower extremity was within normal limits, no evidence of an overlying lumbosacral radiculopathy was seen.  Jill Alexanders MD 10/13/2018 9:25 AM  [facility] [address] [place], [place] [address]  Phone [phone] Fax [phone]

## 2018-10-13 NOTE — Progress Notes (Signed)
Please refer to EMG and nerve conduction procedure note.  

## 2018-10-14 ENCOUNTER — Telehealth: Payer: Self-pay

## 2018-10-14 NOTE — Telephone Encounter (Signed)
-----   Message from Larey Seat, MD sent at 10/13/2018  4:48 PM EST ----- IMPRESSION:  Nerve conduction studies done on both lower extremities were  within normal limits. No evidence of a peripheral neuropathy is  seen. A small fiber neuropathy can be missed by standard nerve  conduction studies however, clinical correlation is required.  EMG evaluation of the right lower extremity was within normal  limits, no evidence of an overlying lumbosacral radiculopathy was  seen.  Jill Alexanders MD 10/13/2018 9:25 AM

## 2018-10-14 NOTE — Telephone Encounter (Signed)
I called pt and advised her of the NCV/EMG study results. Pt verbalized understanding of results. Pt had no questions at this time but was encouraged to call back if questions arise.

## 2018-10-20 ENCOUNTER — Ambulatory Visit (INDEPENDENT_AMBULATORY_CARE_PROVIDER_SITE_OTHER): Payer: Medicare Other | Admitting: Neurology

## 2018-10-20 DIAGNOSIS — G4733 Obstructive sleep apnea (adult) (pediatric): Secondary | ICD-10-CM | POA: Diagnosis not present

## 2018-10-20 DIAGNOSIS — F32A Depression, unspecified: Secondary | ICD-10-CM

## 2018-10-20 DIAGNOSIS — G2581 Restless legs syndrome: Secondary | ICD-10-CM

## 2018-10-20 DIAGNOSIS — R5383 Other fatigue: Secondary | ICD-10-CM

## 2018-10-20 DIAGNOSIS — G4719 Other hypersomnia: Secondary | ICD-10-CM

## 2018-10-20 DIAGNOSIS — R0683 Snoring: Secondary | ICD-10-CM

## 2018-10-20 DIAGNOSIS — F329 Major depressive disorder, single episode, unspecified: Secondary | ICD-10-CM

## 2018-10-22 ENCOUNTER — Emergency Department (HOSPITAL_COMMUNITY): Payer: Medicare Other

## 2018-10-22 ENCOUNTER — Other Ambulatory Visit: Payer: Self-pay

## 2018-10-22 ENCOUNTER — Emergency Department (HOSPITAL_COMMUNITY)
Admission: EM | Admit: 2018-10-22 | Discharge: 2018-10-23 | Disposition: A | Discharge status: Home / Self Care | Payer: Medicare Other | Source: Home / Self Care | Attending: Emergency Medicine | Admitting: Emergency Medicine

## 2018-10-22 ENCOUNTER — Encounter (HOSPITAL_COMMUNITY): Payer: Self-pay

## 2018-10-22 DIAGNOSIS — R52 Pain, unspecified: Secondary | ICD-10-CM | POA: Diagnosis not present

## 2018-10-22 DIAGNOSIS — F329 Major depressive disorder, single episode, unspecified: Secondary | ICD-10-CM | POA: Insufficient documentation

## 2018-10-22 DIAGNOSIS — Y9389 Activity, other specified: Secondary | ICD-10-CM

## 2018-10-22 DIAGNOSIS — W11XXXA Fall on and from ladder, initial encounter: Secondary | ICD-10-CM | POA: Insufficient documentation

## 2018-10-22 DIAGNOSIS — S0003XA Contusion of scalp, initial encounter: Secondary | ICD-10-CM | POA: Insufficient documentation

## 2018-10-22 DIAGNOSIS — S0990XA Unspecified injury of head, initial encounter: Secondary | ICD-10-CM | POA: Diagnosis not present

## 2018-10-22 DIAGNOSIS — Y998 Other external cause status: Secondary | ICD-10-CM

## 2018-10-22 DIAGNOSIS — Y929 Unspecified place or not applicable: Secondary | ICD-10-CM

## 2018-10-22 DIAGNOSIS — S12120A Other displaced dens fracture, initial encounter for closed fracture: Secondary | ICD-10-CM | POA: Diagnosis not present

## 2018-10-22 DIAGNOSIS — Z9049 Acquired absence of other specified parts of digestive tract: Secondary | ICD-10-CM | POA: Insufficient documentation

## 2018-10-22 DIAGNOSIS — S299XXA Unspecified injury of thorax, initial encounter: Secondary | ICD-10-CM | POA: Diagnosis not present

## 2018-10-22 DIAGNOSIS — S12100A Unspecified displaced fracture of second cervical vertebra, initial encounter for closed fracture: Secondary | ICD-10-CM | POA: Insufficient documentation

## 2018-10-22 DIAGNOSIS — S12031A Nondisplaced posterior arch fracture of first cervical vertebra, initial encounter for closed fracture: Secondary | ICD-10-CM | POA: Insufficient documentation

## 2018-10-22 DIAGNOSIS — S199XXA Unspecified injury of neck, initial encounter: Secondary | ICD-10-CM | POA: Diagnosis not present

## 2018-10-22 DIAGNOSIS — Z79899 Other long term (current) drug therapy: Secondary | ICD-10-CM

## 2018-10-22 DIAGNOSIS — R51 Headache: Secondary | ICD-10-CM | POA: Diagnosis not present

## 2018-10-22 DIAGNOSIS — S12000A Unspecified displaced fracture of first cervical vertebra, initial encounter for closed fracture: Secondary | ICD-10-CM | POA: Diagnosis not present

## 2018-10-22 DIAGNOSIS — F419 Anxiety disorder, unspecified: Secondary | ICD-10-CM

## 2018-10-22 DIAGNOSIS — S12191A Other nondisplaced fracture of second cervical vertebra, initial encounter for closed fracture: Secondary | ICD-10-CM | POA: Diagnosis not present

## 2018-10-22 DIAGNOSIS — W19XXXA Unspecified fall, initial encounter: Secondary | ICD-10-CM | POA: Diagnosis not present

## 2018-10-22 DIAGNOSIS — G629 Polyneuropathy, unspecified: Secondary | ICD-10-CM | POA: Diagnosis not present

## 2018-10-22 DIAGNOSIS — Z853 Personal history of malignant neoplasm of breast: Secondary | ICD-10-CM | POA: Insufficient documentation

## 2018-10-22 DIAGNOSIS — S12100D Unspecified displaced fracture of second cervical vertebra, subsequent encounter for fracture with routine healing: Secondary | ICD-10-CM | POA: Diagnosis not present

## 2018-10-22 DIAGNOSIS — S15101A Unspecified injury of right vertebral artery, initial encounter: Secondary | ICD-10-CM | POA: Diagnosis not present

## 2018-10-22 DIAGNOSIS — S15102A Unspecified injury of left vertebral artery, initial encounter: Secondary | ICD-10-CM | POA: Diagnosis not present

## 2018-10-22 DIAGNOSIS — M542 Cervicalgia: Secondary | ICD-10-CM | POA: Diagnosis not present

## 2018-10-22 DIAGNOSIS — S12001A Unspecified nondisplaced fracture of first cervical vertebra, initial encounter for closed fracture: Secondary | ICD-10-CM | POA: Diagnosis not present

## 2018-10-22 LAB — I-STAT CHEM 8, ED
BUN: 19 mg/dL (ref 8–23)
Calcium, Ion: 1.13 mmol/L — ABNORMAL LOW (ref 1.15–1.40)
Chloride: 105 mmol/L (ref 98–111)
Creatinine, Ser: 1 mg/dL (ref 0.44–1.00)
Glucose, Bld: 68 mg/dL — ABNORMAL LOW (ref 70–99)
HCT: 42 % (ref 36.0–46.0)
Hemoglobin: 14.3 g/dL (ref 12.0–15.0)
Potassium: 3.8 mmol/L (ref 3.5–5.1)
Sodium: 138 mmol/L (ref 135–145)
TCO2: 26 mmol/L (ref 22–32)

## 2018-10-22 LAB — BASIC METABOLIC PANEL
Anion gap: 10 (ref 5–15)
BUN: 16 mg/dL (ref 8–23)
CO2: 24 mmol/L (ref 22–32)
Calcium: 9.5 mg/dL (ref 8.9–10.3)
Chloride: 103 mmol/L (ref 98–111)
Creatinine, Ser: 0.97 mg/dL (ref 0.44–1.00)
GFR calc Af Amer: >60 mL/min (ref >60)
GFR calc non Af Amer: >60 mL/min (ref >60)
Glucose, Bld: 75 mg/dL (ref 70–99)
Potassium: 3.9 mmol/L (ref 3.5–5.1)
Sodium: 137 mmol/L (ref 135–145)

## 2018-10-22 LAB — CBC
HCT: 41.5 % (ref 36.0–46.0)
Hemoglobin: 13 g/dL (ref 12.0–15.0)
MCH: 29.9 pg (ref 26.0–34.0)
MCHC: 31.3 g/dL (ref 30.0–36.0)
MCV: 95.4 fL (ref 80.0–100.0)
Platelets: 206 10*3/uL (ref 150–400)
RBC: 4.35 MIL/uL (ref 3.87–5.11)
RDW: 14 % (ref 11.5–15.5)
WBC: 11 10*3/uL — ABNORMAL HIGH (ref 4.0–10.5)
nRBC: 0 % (ref 0.0–0.2)

## 2018-10-22 MED ORDER — IOPAMIDOL (ISOVUE-370) INJECTION 76%
75.0000 mL | Freq: Once | INTRAVENOUS | Status: AC | PRN
  Administered 2018-10-22: 23:00:00 via INTRAVENOUS

## 2018-10-22 MED ORDER — MORPHINE SULFATE (PF) 4 MG/ML IV SOLN
4.0000 mg | Freq: Once | INTRAVENOUS | Status: AC
  Administered 2018-10-22: 4 mg via INTRAVENOUS
  Filled 2018-10-22: qty 1

## 2018-10-22 MED ORDER — MORPHINE SULFATE (PF) 4 MG/ML IV SOLN
4.0000 mg | INTRAVENOUS | Status: AC | PRN
  Administered 2018-10-22 – 2018-10-23 (×3): 4 mg via INTRAVENOUS
  Filled 2018-10-22 (×2): qty 1

## 2018-10-22 MED ORDER — IOPAMIDOL (ISOVUE-370) INJECTION 76%
INTRAVENOUS | Status: AC
  Filled 2018-10-22: qty 100

## 2018-10-22 NOTE — ED Notes (Signed)
To ct

## 2018-10-22 NOTE — ED Notes (Signed)
Iv team here 
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
## 2018-10-22 NOTE — ED Triage Notes (Signed)
Per ems pt had fall from ladder from approx 5 feet. Pt wearing C collar. Fell onto ground hitting posterior head. Sustained 2 hematomas to head. No loc. C/o head and neck pain. Head pain 6/10 and neck is only painful with movemnet

## 2018-10-23 DIAGNOSIS — W11XXXA Fall on and from ladder, initial encounter: Secondary | ICD-10-CM | POA: Insufficient documentation

## 2018-10-23 DIAGNOSIS — M542 Cervicalgia: Secondary | ICD-10-CM | POA: Diagnosis not present

## 2018-10-23 DIAGNOSIS — S12031A Nondisplaced posterior arch fracture of first cervical vertebra, initial encounter for closed fracture: Secondary | ICD-10-CM | POA: Diagnosis not present

## 2018-10-23 DIAGNOSIS — S12121A Other nondisplaced dens fracture, initial encounter for closed fracture: Secondary | ICD-10-CM | POA: Diagnosis not present

## 2018-10-23 MED ORDER — DOCUSATE SODIUM 100 MG PO CAPS: 100.0000 mg | ORAL_CAPSULE | Freq: Two times a day (BID) | ORAL | 0 refills | Status: DC

## 2018-10-23 MED ORDER — ASPIRIN 325 MG PO TABS
325.0000 mg | ORAL_TABLET | Freq: Once | ORAL | Status: AC
  Administered 2018-10-23: 325 mg via ORAL
  Filled 2018-10-23: qty 1

## 2018-10-23 MED ORDER — ASPIRIN EC 325 MG PO TBEC: 325.0000 mg | DELAYED_RELEASE_TABLET | Freq: Every day | ORAL | 0 refills | Status: DC

## 2018-10-23 MED ORDER — OXYCODONE-ACETAMINOPHEN 5-325 MG PO TABS
1.0000 | ORAL_TABLET | Freq: Once | ORAL | Status: AC
  Administered 2018-10-23: 1 via ORAL
  Filled 2018-10-23: qty 1

## 2018-10-23 MED ORDER — OXYCODONE-ACETAMINOPHEN 5-325 MG PO TABS: 1.0000 | ORAL_TABLET | ORAL | 0 refills | Status: DC | PRN

## 2018-10-23 NOTE — Discharge Instructions (Addendum)
Call the neurosurgery office for follow up  Take 325mg  of Aspirin every day  DO NOT REMOVE your cervical collar  Return to the ER as you need for new or worsening symptoms

## 2018-10-24 ENCOUNTER — Encounter (HOSPITAL_COMMUNITY): Payer: Self-pay | Admitting: Emergency Medicine

## 2018-10-24 ENCOUNTER — Inpatient Hospital Stay (HOSPITAL_COMMUNITY)
Admission: EM | Admit: 2018-10-24 | Discharge: 2018-10-27 | DRG: 552 | Disposition: A | Discharge status: Home / Self Care | Payer: Medicare Other | Attending: Internal Medicine | Admitting: Internal Medicine

## 2018-10-24 ENCOUNTER — Other Ambulatory Visit: Payer: Self-pay

## 2018-10-24 DIAGNOSIS — S15101A Unspecified injury of right vertebral artery, initial encounter: Secondary | ICD-10-CM | POA: Diagnosis not present

## 2018-10-24 DIAGNOSIS — K219 Gastro-esophageal reflux disease without esophagitis: Secondary | ICD-10-CM | POA: Diagnosis present

## 2018-10-24 DIAGNOSIS — S0003XA Contusion of scalp, initial encounter: Secondary | ICD-10-CM | POA: Diagnosis not present

## 2018-10-24 DIAGNOSIS — Z807 Family history of other malignant neoplasms of lymphoid, hematopoietic and related tissues: Secondary | ICD-10-CM

## 2018-10-24 DIAGNOSIS — Z923 Personal history of irradiation: Secondary | ICD-10-CM

## 2018-10-24 DIAGNOSIS — Z981 Arthrodesis status: Secondary | ICD-10-CM

## 2018-10-24 DIAGNOSIS — W19XXXD Unspecified fall, subsequent encounter: Secondary | ICD-10-CM

## 2018-10-24 DIAGNOSIS — G4733 Obstructive sleep apnea (adult) (pediatric): Secondary | ICD-10-CM | POA: Diagnosis present

## 2018-10-24 DIAGNOSIS — F329 Major depressive disorder, single episode, unspecified: Secondary | ICD-10-CM | POA: Diagnosis present

## 2018-10-24 DIAGNOSIS — Z79899 Other long term (current) drug therapy: Secondary | ICD-10-CM

## 2018-10-24 DIAGNOSIS — Z91018 Allergy to other foods: Secondary | ICD-10-CM

## 2018-10-24 DIAGNOSIS — M8008XA Age-related osteoporosis with current pathological fracture, vertebra(e), initial encounter for fracture: Secondary | ICD-10-CM

## 2018-10-24 DIAGNOSIS — S12120A Other displaced dens fracture, initial encounter for closed fracture: Secondary | ICD-10-CM | POA: Diagnosis present

## 2018-10-24 DIAGNOSIS — Z9049 Acquired absence of other specified parts of digestive tract: Secondary | ICD-10-CM

## 2018-10-24 DIAGNOSIS — N951 Menopausal and female climacteric states: Secondary | ICD-10-CM | POA: Diagnosis present

## 2018-10-24 DIAGNOSIS — G629 Polyneuropathy, unspecified: Secondary | ICD-10-CM | POA: Diagnosis present

## 2018-10-24 DIAGNOSIS — R5381 Other malaise: Secondary | ICD-10-CM | POA: Diagnosis not present

## 2018-10-24 DIAGNOSIS — Z9012 Acquired absence of left breast and nipple: Secondary | ICD-10-CM

## 2018-10-24 DIAGNOSIS — Z853 Personal history of malignant neoplasm of breast: Secondary | ICD-10-CM

## 2018-10-24 DIAGNOSIS — M5030 Other cervical disc degeneration, unspecified cervical region: Secondary | ICD-10-CM | POA: Diagnosis present

## 2018-10-24 DIAGNOSIS — Z789 Other specified health status: Secondary | ICD-10-CM

## 2018-10-24 DIAGNOSIS — G2581 Restless legs syndrome: Secondary | ICD-10-CM | POA: Diagnosis not present

## 2018-10-24 DIAGNOSIS — W11XXXA Fall on and from ladder, initial encounter: Secondary | ICD-10-CM | POA: Diagnosis present

## 2018-10-24 DIAGNOSIS — M542 Cervicalgia: Secondary | ICD-10-CM | POA: Diagnosis not present

## 2018-10-24 DIAGNOSIS — S12031A Nondisplaced posterior arch fracture of first cervical vertebra, initial encounter for closed fracture: Secondary | ICD-10-CM

## 2018-10-24 DIAGNOSIS — S15102A Unspecified injury of left vertebral artery, initial encounter: Secondary | ICD-10-CM | POA: Diagnosis not present

## 2018-10-24 DIAGNOSIS — Z8249 Family history of ischemic heart disease and other diseases of the circulatory system: Secondary | ICD-10-CM

## 2018-10-24 DIAGNOSIS — E875 Hyperkalemia: Secondary | ICD-10-CM | POA: Diagnosis present

## 2018-10-24 DIAGNOSIS — W19XXXA Unspecified fall, initial encounter: Secondary | ICD-10-CM | POA: Diagnosis not present

## 2018-10-24 DIAGNOSIS — Z803 Family history of malignant neoplasm of breast: Secondary | ICD-10-CM

## 2018-10-24 DIAGNOSIS — T451X5S Adverse effect of antineoplastic and immunosuppressive drugs, sequela: Secondary | ICD-10-CM

## 2018-10-24 DIAGNOSIS — S12000A Unspecified displaced fracture of first cervical vertebra, initial encounter for closed fracture: Secondary | ICD-10-CM | POA: Diagnosis not present

## 2018-10-24 DIAGNOSIS — F419 Anxiety disorder, unspecified: Secondary | ICD-10-CM | POA: Diagnosis present

## 2018-10-24 DIAGNOSIS — S12190A Other displaced fracture of second cervical vertebra, initial encounter for closed fracture: Secondary | ICD-10-CM

## 2018-10-24 DIAGNOSIS — Z888 Allergy status to other drugs, medicaments and biological substances status: Secondary | ICD-10-CM

## 2018-10-24 DIAGNOSIS — S12100A Unspecified displaced fracture of second cervical vertebra, initial encounter for closed fracture: Secondary | ICD-10-CM

## 2018-10-24 DIAGNOSIS — Z7982 Long term (current) use of aspirin: Secondary | ICD-10-CM

## 2018-10-24 DIAGNOSIS — R52 Pain, unspecified: Secondary | ICD-10-CM

## 2018-10-24 HISTORY — DX: Sleep apnea, unspecified: G47.30

## 2018-10-24 LAB — BASIC METABOLIC PANEL
Anion gap: 6 (ref 5–15)
BUN: 12 mg/dL (ref 8–23)
CO2: 26 mmol/L (ref 22–32)
Calcium: 8.7 mg/dL — ABNORMAL LOW (ref 8.9–10.3)
Chloride: 102 mmol/L (ref 98–111)
Creatinine, Ser: 0.78 mg/dL (ref 0.44–1.00)
GFR calc Af Amer: >60 mL/min (ref >60)
GFR calc non Af Amer: >60 mL/min (ref >60)
Glucose, Bld: 95 mg/dL (ref 70–99)
Potassium: 6.4 mmol/L (ref 3.5–5.1)
Sodium: 134 mmol/L — ABNORMAL LOW (ref 135–145)

## 2018-10-24 LAB — CBC WITH DIFFERENTIAL/PLATELET
Abs Immature Granulocytes: 0.03 10*3/uL (ref 0.00–0.07)
Basophils Absolute: 0 10*3/uL (ref 0.0–0.1)
Basophils Relative: 0 %
Eosinophils Absolute: 0.1 10*3/uL (ref 0.0–0.5)
Eosinophils Relative: 1 %
HCT: 41 % (ref 36.0–46.0)
Hemoglobin: 12.2 g/dL (ref 12.0–15.0)
Immature Granulocytes: 0 %
Lymphocytes Relative: 16 %
Lymphs Abs: 1.2 10*3/uL (ref 0.7–4.0)
MCH: 29.3 pg (ref 26.0–34.0)
MCHC: 29.8 g/dL — ABNORMAL LOW (ref 30.0–36.0)
MCV: 98.6 fL (ref 80.0–100.0)
Monocytes Absolute: 0.8 10*3/uL (ref 0.1–1.0)
Monocytes Relative: 10 %
Neutro Abs: 5.6 10*3/uL (ref 1.7–7.7)
Neutrophils Relative %: 73 %
Platelets: 203 10*3/uL (ref 150–400)
RBC: 4.16 MIL/uL (ref 3.87–5.11)
RDW: 14.3 % (ref 11.5–15.5)
WBC: 7.7 10*3/uL (ref 4.0–10.5)
nRBC: 0 % (ref 0.0–0.2)

## 2018-10-24 MED ORDER — ZOLPIDEM TARTRATE 5 MG PO TABS: 5.0000 mg | ORAL_TABLET | Freq: Every evening | ORAL | Status: DC | PRN

## 2018-10-24 MED ORDER — FERROUS SULFATE 325 (65 FE) MG PO TBEC: 325.0000 mg | DELAYED_RELEASE_TABLET | Freq: Every day | ORAL | Status: DC

## 2018-10-24 MED ORDER — FLEET ENEMA 7-19 GM/118ML RE ENEM: 1.0000 | ENEMA | Freq: Once | RECTAL | Status: DC | PRN

## 2018-10-24 MED ORDER — MORPHINE SULFATE (PF) 2 MG/ML IV SOLN: 2.0000 mg | INTRAVENOUS | Status: DC | PRN

## 2018-10-24 MED ORDER — DIPHENHYDRAMINE HCL 12.5 MG/5ML PO ELIX: 12.5000 mg | ORAL_SOLUTION | Freq: Four times a day (QID) | ORAL | Status: DC | PRN

## 2018-10-24 MED ORDER — MORPHINE SULFATE (PF) 4 MG/ML IV SOLN
4.0000 mg | Freq: Once | INTRAVENOUS | Status: AC
  Administered 2018-10-24: 4 mg via INTRAVENOUS
  Filled 2018-10-24: qty 1

## 2018-10-24 MED ORDER — PANTOPRAZOLE SODIUM 40 MG PO TBEC
40.0000 mg | DELAYED_RELEASE_TABLET | Freq: Every day | ORAL | Status: DC
  Administered 2018-10-24 – 2018-10-27 (×4): 40 mg via ORAL
  Filled 2018-10-24 (×4): qty 1

## 2018-10-24 MED ORDER — POLYETHYLENE GLYCOL 3350 17 G PO PACK
17.0000 g | PACK | Freq: Every day | ORAL | Status: DC | PRN
  Administered 2018-10-26: 17 g via ORAL
  Filled 2018-10-24: qty 1

## 2018-10-24 MED ORDER — CLONAZEPAM 1 MG PO TABS
1.0000 mg | ORAL_TABLET | Freq: Every day | ORAL | Status: DC
  Administered 2018-10-24 – 2018-10-26 (×3): 1 mg via ORAL
  Filled 2018-10-24 (×3): qty 1

## 2018-10-24 MED ORDER — FERROUS SULFATE 325 (65 FE) MG PO TABS
325.0000 mg | ORAL_TABLET | Freq: Every day | ORAL | Status: DC
  Administered 2018-10-24 – 2018-10-26 (×3): 325 mg via ORAL
  Filled 2018-10-24 (×3): qty 1

## 2018-10-24 MED ORDER — MORPHINE SULFATE 2 MG/ML IV SOLN
INTRAVENOUS | Status: DC
  Administered 2018-10-24: 12:00:00 via INTRAVENOUS
  Administered 2018-10-24: 5.5 mg via INTRAVENOUS
  Administered 2018-10-24: 20:00:00 via INTRAVENOUS
  Administered 2018-10-25: 5 mg via INTRAVENOUS
  Administered 2018-10-25: 2 mg via INTRAVENOUS
  Administered 2018-10-25: 2.5 mg via INTRAVENOUS
  Administered 2018-10-25: 0.5 mg via INTRAVENOUS
  Administered 2018-10-25: 4.5 mg via INTRAVENOUS
  Administered 2018-10-25: 2.5 mg via INTRAVENOUS
  Administered 2018-10-26: 3 mg via INTRAVENOUS
  Administered 2018-10-26: 3.5 mg via INTRAVENOUS
  Administered 2018-10-26: 0 mg via INTRAVENOUS
  Filled 2018-10-24: qty 30

## 2018-10-24 MED ORDER — SODIUM CHLORIDE 0.9 % IV BOLUS
1000.0000 mL | Freq: Once | INTRAVENOUS | Status: AC
  Administered 2018-10-24: 1000 mL via INTRAVENOUS

## 2018-10-24 MED ORDER — NALOXONE HCL 0.4 MG/ML IJ SOLN: 0.4000 mg | INTRAMUSCULAR | Status: DC | PRN

## 2018-10-24 MED ORDER — ACETAMINOPHEN 325 MG PO TABS
650.0000 mg | ORAL_TABLET | Freq: Four times a day (QID) | ORAL | Status: DC | PRN
  Administered 2018-10-26 – 2018-10-27 (×3): 650 mg via ORAL
  Filled 2018-10-24 (×3): qty 2

## 2018-10-24 MED ORDER — SODIUM CHLORIDE 0.9% FLUSH: 9.0000 mL | INTRAVENOUS | Status: DC | PRN

## 2018-10-24 MED ORDER — BUPROPION HCL ER (XL) 150 MG PO TB24
150.0000 mg | ORAL_TABLET | Freq: Every day | ORAL | Status: DC
  Administered 2018-10-24 – 2018-10-27 (×4): 150 mg via ORAL
  Filled 2018-10-24 (×4): qty 1

## 2018-10-24 MED ORDER — LORATADINE 10 MG PO TABS: 10.0000 mg | ORAL_TABLET | Freq: Every day | ORAL | Status: DC

## 2018-10-24 MED ORDER — CITALOPRAM HYDROBROMIDE 40 MG PO TABS
40.0000 mg | ORAL_TABLET | Freq: Every day | ORAL | Status: DC
  Administered 2018-10-24 – 2018-10-26 (×3): 40 mg via ORAL
  Filled 2018-10-24 (×3): qty 1

## 2018-10-24 MED ORDER — ACETAMINOPHEN 650 MG RE SUPP: 650.0000 mg | Freq: Four times a day (QID) | RECTAL | Status: DC | PRN

## 2018-10-24 MED ORDER — CITALOPRAM HYDROBROMIDE 40 MG PO TABS: 40.0000 mg | ORAL_TABLET | Freq: Every day | ORAL | Status: DC

## 2018-10-24 MED ORDER — ONDANSETRON HCL 4 MG/2ML IJ SOLN: 4.0000 mg | Freq: Four times a day (QID) | INTRAMUSCULAR | Status: DC | PRN

## 2018-10-24 MED ORDER — DOCUSATE SODIUM 100 MG PO CAPS
100.0000 mg | ORAL_CAPSULE | Freq: Two times a day (BID) | ORAL | Status: DC
  Administered 2018-10-24 – 2018-10-26 (×5): 100 mg via ORAL
  Filled 2018-10-24 (×5): qty 1

## 2018-10-24 MED ORDER — PRAMIPEXOLE DIHYDROCHLORIDE 0.125 MG PO TABS
0.5000 mg | ORAL_TABLET | Freq: Every day | ORAL | Status: DC
  Administered 2018-10-24 – 2018-10-26 (×3): 0.5 mg via ORAL
  Filled 2018-10-24: qty 2
  Filled 2018-10-24 (×2): qty 4

## 2018-10-24 MED ORDER — DIPHENHYDRAMINE HCL 50 MG/ML IJ SOLN: 12.5000 mg | Freq: Four times a day (QID) | INTRAMUSCULAR | Status: DC | PRN

## 2018-10-24 MED ORDER — OXYCODONE HCL 5 MG PO TABS: 5.0000 mg | ORAL_TABLET | ORAL | Status: DC | PRN

## 2018-10-24 MED ORDER — BISACODYL 5 MG PO TBEC
5.0000 mg | DELAYED_RELEASE_TABLET | Freq: Every day | ORAL | Status: DC | PRN
  Administered 2018-10-26: 5 mg via ORAL
  Filled 2018-10-24: qty 1

## 2018-10-24 MED ORDER — MORPHINE SULFATE 2 MG/ML IV SOLN
INTRAVENOUS | Status: DC
  Filled 2018-10-24: qty 30

## 2018-10-24 MED ORDER — ONDANSETRON HCL 4 MG/2ML IJ SOLN
4.0000 mg | Freq: Once | INTRAMUSCULAR | Status: AC
  Administered 2018-10-24: 4 mg via INTRAVENOUS
  Filled 2018-10-24: qty 2

## 2018-10-24 NOTE — ED Provider Notes (Signed)
[facility] EMERGENCY DEPARTMENT Provider Note   CSN: [id number] Arrival date & time: 10/24/18  0751     History   Chief Complaint Chief Complaint  Patient presents with  . Neck Pain    HPI Alexis Fox is a 62 y.o. female.  Pt presents to the ED today with neck pain.  She fell off a ladder on 11/20 and c/o neck pain.  She came to the ED that day and was diagnosed with a nondisplaced fracture through the posterior arch/bilateral lamina of C1.  She also has a complicated fracture of C2 involving a comminuted fracture of the right posterior body extending into the pedicle and vertebral foramen.  She also has a displaced fracture through the left C2 vertebral body.  In addition, she had mild injury to bilateral vertebral arteries.  The pt was d/w NS (Dr. Ronnald Ramp) who thought pt was a candidate for outpatient treatment.  She was d/c home in a collar.  She did f/u with Dr. Vertell Limber yesterday who agreed treatment was non-operative.  PT said her pain has been severe and the oxycodone is not treating it.  She called Dr. Vertell Limber this morning who recommended that she come to the ED.  Pt still denying numbness or weakness in her arms and legs.     Past Medical History:  Diagnosis Date  . Anxiety   . Arthritis    knees  . Breast cancer ([place])   . Breast cancer of upper-outer quadrant of left female breast ([place]) 03/20/2016  . Cholecystitis   . Complication of anesthesia    BP "crashes" post op  . Depression   . Family history of brain cancer   . Family history of breast cancer   . Hot flashes   . Personal history of radiation therapy   . Restless leg syndrome     Patient Active Problem List   Diagnosis Date Noted  . DDD (degenerative disc disease), lumbosacral 09/15/2018  . Fatigue due to depression 09/15/2018  . 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 Central line complication 08/65/7846  . Genetic testing 05/15/2016  . Family history of breast cancer   .  Family history of brain cancer   . Breast cancer of upper-outer quadrant of left female breast ([place]) 03/20/2016  . Transaminitis 10/03/2015  . Symptomatic cholelithiasis 10/03/2015  . RUQ abdominal pain   . Depression 06/24/2013  . RLS (restless legs syndrome) 06/24/2013    Past Surgical History:  Procedure Laterality Date  . BREAST BIOPSY    . BREAST LUMPECTOMY    . BUNIONECTOMY    . CHOLECYSTECTOMY N/A 10/04/2015   Procedure: LAPAROSCOPIC CHOLECYSTECTOMY WITH INTRAOPERATIVE CHOLANGIOGRAM;  Surgeon: Greer Pickerel, MD;  Location: [facility];  Service: General;  Laterality: N/A;  . ELBOW SURGERY     right for epicondylitis 2010   . FOOT SURGERY     1983 -tarsal tunnel release  . IR GENERIC HISTORICAL  10/26/2016   IR CV LINE INJECTION 10/26/2016 [facility]-INTERV RAD  . LUMBAR DISC SURGERY  04/04/2012  . PORT-A-CATH REMOVAL N/A 11/15/2016   Procedure: REMOVAL PORT-A-CATH;  Surgeon: Rolm Bookbinder, MD;  Location: [facility];  Service: General;  Laterality: N/A;  . PORTACATH PLACEMENT Right 04/02/2016   Procedure: INSERTION PORT-A-CATH WITH [place];  Surgeon: Rolm Bookbinder, MD;  Location: [facility];  Service: General;  Laterality: Right;  . RADIOACTIVE SEED GUIDED PARTIAL MASTECTOMY/AXILLARY SENTINEL NODE BIOPSY/AXILLARY NODE DISSECTION Left 09/12/2016   Procedure: LEFT BREAST SEED GUIDED  LUMPECTOMY, LEFT AXILLARY SENTINEL NODE BIOPSY, LEFT SEED GUIDED AXILLARY NODE EXCISION( TARGETED AXILLARY DISSECTION), BLUE DYE INJECTION;  Surgeon: Rolm Bookbinder, MD;  Location: [facility];  Service: General;  Laterality: Left;  . SPINAL FUSION  5/12   L5-S1  . TARSAL TUNNEL RELEASE       OB History    Gravida  3   Para  2   Term      Preterm      AB      Living  2     SAB      TAB      Ectopic      Multiple      Live Births               Home Medications    Prior to Admission medications   Medication Sig Start Date End Date Taking?  Authorizing Provider  aspirin EC 325 MG tablet Take 1 tablet (325 mg total) by mouth daily. 10/23/18   Jola Schmidt, MD  buPROPion (WELLBUTRIN XL) 150 MG 24 hr tablet Take 150 mg by mouth daily. 03/06/16   [provider]  cetirizine (ZYRTEC ALLERGY) 10 MG tablet Take 10 mg by mouth daily.    [provider]  Cholecalciferol (VITAMIN D3 PO) Take by mouth 3 (three) times a week.    [provider]  citalopram (CELEXA) 40 MG tablet Take 40 mg by mouth daily.  12/21/15   [provider]  clonazePAM (KLONOPIN) 1 MG tablet Take 1 mg by mouth at bedtime. Dr Laurann Montana    [provider]  docusate sodium (COLACE) 100 MG capsule Take 1 capsule (100 mg total) by mouth every 12 (twelve) hours. 10/23/18   Jola Schmidt, MD  esomeprazole (NEXIUM) 20 MG capsule Take 1 capsule (20 mg total) by mouth daily at 12 noon. 04/22/17   Nicholas Lose, MD  ferrous sulfate 325 (65 FE) MG EC tablet Take 325 mg by mouth at bedtime.     [provider]  HYDROcodone-acetaminophen (NORCO/VICODIN) 5-325 MG tablet Take 1 tablet by mouth every 6 (six) hours as needed for moderate pain. Patient not taking: Reported on 10/22/2018 09/16/18   Garald Balding, MD  NONFORMULARY OR COMPOUNDED [place]:  Antiinflammatory Cream - Diclofenac 35, Baclofen 2%, Lidocaine 2%, apply 1-2 grams to affected area 3-4 times daily. Patient not taking: Reported on 10/22/2018 03/01/17   Bronson Ing, DPM  nystatin-triamcinolone ointment [facility]) Apply 1 application topically 2 (two) times daily. Patient not taking: Reported on 10/22/2018 02/04/18   Huel Cote, NP  oxyCODONE-acetaminophen (PERCOCET/ROXICET) 5-325 MG tablet Take 1 tablet by mouth every 4 (four) hours as needed for severe pain. 10/23/18   Jola Schmidt, MD  pramipexole (MIRAPEX) 0.5 MG tablet Take 1/2 tab at 4 pm and 1 tab at night time prn. Patient taking differently: Take 0.5 mg by mouth at bedtime.  09/15/18    Dohmeier, Asencion Partridge, MD  zolpidem (AMBIEN) 5 MG tablet Take 5 mg by mouth at bedtime as needed for sleep. 09/17/18   [provider]    Family History Family History  Problem Relation Age of Onset  . Heart disease Mother   . Pulmonary fibrosis Mother   . Hypertension Mother   . Cancer Father 85       astocytoma  . Hypertension Brother   . Lymphoma Maternal Aunt   . Anesthesia problems Neg Hx     Social History Social History  Tobacco Use  . Smoking status: Never Smoker  . Smokeless tobacco: Never Used  Substance Use Topics  . Alcohol use: Yes    Comment: rarely 6 beer a year  . Drug use: No     Allergies   Turmeric and Decadron [dexamethasone]   Review of Systems Review of Systems  Musculoskeletal: Positive for neck pain.  All other systems reviewed and are negative.    Physical Exam Updated Vital Signs BP (!) 106/49   Pulse 71   Temp 99.9 F (37.7 C) (Oral)   Resp 17   SpO2 93%   Physical Exam  Constitutional: She is oriented to person, place, and time. She appears well-developed and well-nourished.  HENT:  Head: Normocephalic and atraumatic.  Right Ear: External ear normal.  Left Ear: External ear normal.  Nose: Nose normal.  Mouth/Throat: Oropharynx is clear and moist.  Eyes: Pupils are equal, round, and reactive to light. Conjunctivae and EOM are normal.  Neck:  Pt in c-collar  Cardiovascular: Normal rate, regular rhythm, normal heart sounds and intact distal pulses.  Pulmonary/Chest: Effort normal and breath sounds normal.  Abdominal: Soft. Bowel sounds are normal.  Musculoskeletal: Normal range of motion.  Neurological: She is alert and oriented to person, place, and time.  Skin: Skin is warm. Capillary refill takes less than 2 seconds.  Psychiatric: She has a normal mood and affect. Her behavior is normal. Judgment and thought content normal.  Nursing note and vitals reviewed.    ED Treatments / Results  Labs (all labs ordered  are listed, but only abnormal results are displayed) Labs Reviewed  CBC WITH DIFFERENTIAL/PLATELET - Abnormal; Notable for the following components:      Result Value   MCHC 29.8 (*)    All other components within normal limits  BASIC METABOLIC PANEL    EKG None  Radiology Ct Head Wo Contrast  Result Date: 10/22/2018 CLINICAL DATA:  Pain after fall. EXAM: CT HEAD WITHOUT CONTRAST CT CERVICAL SPINE WITHOUT CONTRAST TECHNIQUE: Multidetector CT imaging of the head and cervical spine was performed following the standard protocol without intravenous contrast. Multiplanar CT image reconstructions of the cervical spine were also generated. COMPARISON:  None. FINDINGS: CT HEAD FINDINGS Brain: No evidence of acute infarction, hemorrhage, hydrocephalus, extra-axial collection or mass lesion/mass effect. Vascular: No hyperdense vessel or unexpected calcification. Skull: Normal. Negative for fracture or focal lesion. Sinuses/Orbits: No acute finding. Other: Hematoma over the superior scalp to the right of midline. CT CERVICAL SPINE FINDINGS Alignment: Slight anterolisthesis of C2 versus C3 of approximately 2 mm. Reversal of normal lordosis centered at C5. Minimal anterolisthesis of T1 versus T2 and T2 versus T3, 1 or 2 mm. No fractures identified at these levels. No other significant malalignment. Skull base and vertebrae: There are fractures through the bilateral lamina/posterior arch of C1 without displacement. C1 articulates normally with the occipital condyles and the anterior arch is intact. There is a complex fracture involving C2. On the right, there is a comminuted fracture through the inferior body of C2 with extension into the pedicle an vertebral foramen. The fracture extends slightly into the superior facet. There is a displaced fracture through the left posterior C2 vertebral body with probable slight extension into the pedicle and marked involvement of the left vertebral foramen. There appears to  be a displaced fracture fragment in the region of her the vertebral foramen. Soft tissues and spinal canal: There is blood posterior to C2. No other soft tissue abnormalities identified. Disc levels:  Multilevel degenerative changes. Upper chest: Negative. Other: No other abnormalities. IMPRESSION: 1. Nondisplaced fractures through the posterior arch/bilateral lamina of C1. 2. Complicated fracture of C2 involving a comminuted fracture of the right posterior body extending into the pedicle and vertebral foramen. Slight extension into the superior right facet. There is also a displaced fracture through the left C2 vertebral body with slight extension into the pedicle and significant involvement of the left vertebral foramen. There appears to be a displaced bone fragment in the region of the left vertebral foramen. Recommend a CTA for evaluation of the vertebral arteries. 3. Slight anterolisthesis of C2 versus C3. Given the fracture at C2, it would be difficult to exclude ligamentous injury. 4. Minimal anterolisthesis of T1 versus T2 and T2 versus T3, favored to be due to facet degenerative changes at these levels. 5. There is blood posterior to C2 consistent with the fracture in this location. 6. Degenerative changes. 7. No acute intracranial abnormalities. Findings called to Dr. Venora Maples. Electronically Signed   By: Dorise Bullion III M.D   On: 10/22/2018 21:29   Ct Angio Neck W And/or Wo Contrast  Result Date: 10/22/2018 CLINICAL DATA:  62 y/o F; C-spine fracture. History of breast cancer. EXAM: CT ANGIOGRAPHY NECK TECHNIQUE: Multidetector CT imaging of the neck was performed using the standard protocol during bolus administration of intravenous contrast. Multiplanar CT image reconstructions and MIPs were obtained to evaluate the vascular anatomy. Carotid stenosis measurements (when applicable) are obtained utilizing NASCET criteria, using the distal internal carotid diameter as the denominator. CONTRAST:  75 cc  Isovue 370 COMPARISON:  10/22/2018 CT head and cervical spine. FINDINGS: Aortic arch: Standard branching. Imaged portion shows no evidence of aneurysm or dissection. No significant stenosis of the major arch vessel origins. Right carotid system: No evidence of dissection, stenosis (50% or greater) or occlusion. Left carotid system: No evidence of dissection, stenosis (50% or greater) or occlusion. Vertebral arteries: Mild lumen irregularity (series 10, image 131) without minimal stenosis and without dissection of the right vertebral artery just below the C1 foramen transversarium compatible with blunt vascular injury Denver grade 1. Moderate stenosis of the left vertebral artery traversing the left C2 foramen transversarium (series 11, image 101) with approximately 50% stenosis and no dissection flap. Findings may be due to external mass effect from the surrounding fracture displaced bony structures or intramural hematoma compatible with Denver grade 2 blunt vascular injury. Skeleton: Stable acute fractures through the bilateral lamina/posterior arch of C1 and stable complex comminuted fracture through the body of C2 with extension into pedicles and vertebral foramen. Mild spondylosis with multilevel disc and facet degenerative changes greatest at the C5-C7 levels. Other neck: Negative. Upper chest: Negative. IMPRESSION: 1. Right vertebral artery blunt vascular injury Denver grade 1 just below the C1 foramen transversarium. No dissection or significant stenosis. 2. Left vertebral artery moderate stenosis at C2 foramen transversarium without dissection. Findings may be due to external mass effect from displaced fractures or Denver grade 2 blunt vascular injury due to intramural hematoma. 3. Widely patent carotid systems. 4. Stable acute fractures of C1 and C2 as described on prior study. These results were called by telephone at the time of interpretation on 10/22/2018 at 11:25 pm to Dr. Jola Schmidt , who verbally  acknowledged these results. Electronically Signed   By: Kristine Garbe M.D.   On: 10/22/2018 23:28   Ct Cervical Spine Wo Contrast  Result Date: 10/22/2018 CLINICAL DATA:  Pain after fall. EXAM: CT HEAD WITHOUT CONTRAST CT CERVICAL  SPINE WITHOUT CONTRAST TECHNIQUE: Multidetector CT imaging of the head and cervical spine was performed following the standard protocol without intravenous contrast. Multiplanar CT image reconstructions of the cervical spine were also generated. COMPARISON:  None. FINDINGS: CT HEAD FINDINGS Brain: No evidence of acute infarction, hemorrhage, hydrocephalus, extra-axial collection or mass lesion/mass effect. Vascular: No hyperdense vessel or unexpected calcification. Skull: Normal. Negative for fracture or focal lesion. Sinuses/Orbits: No acute finding. Other: Hematoma over the superior scalp to the right of midline. CT CERVICAL SPINE FINDINGS Alignment: Slight anterolisthesis of C2 versus C3 of approximately 2 mm. Reversal of normal lordosis centered at C5. Minimal anterolisthesis of T1 versus T2 and T2 versus T3, 1 or 2 mm. No fractures identified at these levels. No other significant malalignment. Skull base and vertebrae: There are fractures through the bilateral lamina/posterior arch of C1 without displacement. C1 articulates normally with the occipital condyles and the anterior arch is intact. There is a complex fracture involving C2. On the right, there is a comminuted fracture through the inferior body of C2 with extension into the pedicle an vertebral foramen. The fracture extends slightly into the superior facet. There is a displaced fracture through the left posterior C2 vertebral body with probable slight extension into the pedicle and marked involvement of the left vertebral foramen. There appears to be a displaced fracture fragment in the region of her the vertebral foramen. Soft tissues and spinal canal: There is blood posterior to C2. No other soft tissue  abnormalities identified. Disc levels:  Multilevel degenerative changes. Upper chest: Negative. Other: No other abnormalities. IMPRESSION: 1. Nondisplaced fractures through the posterior arch/bilateral lamina of C1. 2. Complicated fracture of C2 involving a comminuted fracture of the right posterior body extending into the pedicle and vertebral foramen. Slight extension into the superior right facet. There is also a displaced fracture through the left C2 vertebral body with slight extension into the pedicle and significant involvement of the left vertebral foramen. There appears to be a displaced bone fragment in the region of the left vertebral foramen. Recommend a CTA for evaluation of the vertebral arteries. 3. Slight anterolisthesis of C2 versus C3. Given the fracture at C2, it would be difficult to exclude ligamentous injury. 4. Minimal anterolisthesis of T1 versus T2 and T2 versus T3, favored to be due to facet degenerative changes at these levels. 5. There is blood posterior to C2 consistent with the fracture in this location. 6. Degenerative changes. 7. No acute intracranial abnormalities. Findings called to Dr. Venora Maples. Electronically Signed   By: Dorise Bullion III M.D   On: 10/22/2018 21:29   Dg Chest Portable 1 View  Result Date: 10/22/2018 CLINICAL DATA:  Fall from ladder.  History of breast cancer. EXAM: PORTABLE CHEST 1 VIEW COMPARISON:  Chest radiograph Apr 02, 2016 FINDINGS: Cardiomediastinal silhouette is normal. No pleural effusions or focal consolidations. LEFT lung base calcified granuloma. Calcified mediastinal lymph nodes. Trachea projects midline and there is no pneumothorax. Soft tissue planes and included osseous structures are non-suspicious. LEFT breast surgical clips. Interval removal of RIGHT Port-A-Cath without retained fragments. IMPRESSION: 1. No acute cardiopulmonary process. 2. Old granulomatous disease. 3. Interval removal of RIGHT chest Port-A-Cath. Electronically Signed    By: Elon Alas M.D.   On: 10/22/2018 20:59    Procedures Procedures (including critical care time)  Medications Ordered in ED Medications  sodium chloride 0.9 % bolus 1,000 mL (1,000 mLs Intravenous New Bag/Given 10/24/18 0822)  morphine 4 MG/ML injection 4 mg (4 mg Intravenous Given  10/24/18 0812)  ondansetron (ZOFRAN) injection 4 mg (4 mg Intravenous Given 10/24/18 1191)     Initial Impression / Assessment and Plan / ED Course  I have reviewed the triage vital signs and the nursing notes.  Pertinent labs & imaging results that were available during my care of the patient were reviewed by me and considered in my medical decision making (see chart for details).     Pt d/w Dr. Vertell Limber (NS) who asks that I speak with the hospitalist regarding admission for pain control.  He does not recommend any further imaging.  He will follow her in the hospital.  Pt d/w Dr. Earnest Conroy (triad) who will admit.  Final Clinical Impressions(s) / ED Diagnoses   Final diagnoses:  Closed nondisplaced fracture of posterior arch of first cervical vertebra, initial encounter ([place])  Other closed displaced fracture of second cervical vertebra, initial encounter ([place])  Injury of left vertebral artery, initial encounter  Injury of vertebral artery, right, initial encounter  Failure of outpatient treatment  Intractable pain    ED Discharge Orders    None       Isla Pence, MD 10/24/18 [phone]

## 2018-10-24 NOTE — ED Triage Notes (Signed)
Pt arrives via [facility] from home with complaints of neck pain. Pt fell off ladder recently and diagnosed with C1-C2 fracture. Pt in c-collar. Pt seen MD yesterday but here for uncontrolled pain. Pt reports last taking oxy either at 4:30 or 6:30 this morning.

## 2018-10-24 NOTE — H&P (Signed)
History and Physical    DOA: 10/24/2018  PCP: [name], MD  Patient coming from: home  Chief Complaint: Neck pain   HPI: Alexis Fox is a 62 y.o. female with history h/o depression, GERD, left breast cancer status post chemotherapy/surgery/radiation who is pretty functional at baseline and was a former Therapist, sports at [employer] [facility] presents with complaints of refractory severe neck pain since a fall 2 days back.  On Wednesday, patient was on her driveway using a 6-step ladder to cover her RV when she lost balance and landed on gravel.  She states she immediately knew she injured her neck.  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  She was brought to the ED and work-up revealed C1-C2 vertebral fracture with displaced fragments and grade 2 blunt vascular injury to vertebral artery (described as moderate stenosis either due to external compression by bony fragments or intramural hematoma).  CT head did not reveal any intracranial injury but reported superior scalp hematoma to the right of midline.  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  She states her pain level was initially manageable but this morning she was in intractable pain which brought her back to the ED. ED physician discussed with neurosurgery Dr. Vertell Limber who recommended admission for pain control with IV medications given severe neck injury. Patient denies any symptoms of dizziness or chest pain or palpitations before or after the fall.  She denies fever, chills, dysuria, nausea, vomiting or headache.  She currently rates her pain at 6/10 after 4 mg of IV morphine.  She is alert oriented x3 and is able to provide detailed history.  Husband who is a Airline pilot by occupation is at bedside.   Review of Systems: As per HPI otherwise 10 point review of systems negative.    Past  Medical History:  Diagnosis Date  . Anxiety   . Arthritis    knees  . Breast cancer ([place])   . Breast cancer of upper-outer quadrant of left female breast ([place]) 03/20/2016  . Cholecystitis   . Complication of anesthesia    BP "crashes" post op  . Depression   . Family history of brain cancer   . Family history of breast cancer   . Hot flashes   . Personal history of radiation therapy   . Restless leg syndrome     Past Surgical History:  Procedure Laterality Date  . BREAST BIOPSY    . BREAST LUMPECTOMY    . BUNIONECTOMY    . CHOLECYSTECTOMY N/A 10/04/2015   Procedure: LAPAROSCOPIC CHOLECYSTECTOMY WITH INTRAOPERATIVE CHOLANGIOGRAM;  Surgeon: Greer Pickerel, MD;  Location: [place];  Service: General;  Laterality: N/A;  . ELBOW SURGERY     right for epicondylitis 2010   . FOOT SURGERY     1983 -tarsal tunnel release  . IR GENERIC HISTORICAL  10/26/2016   IR CV LINE INJECTION 10/26/2016 [facility]-INTERV RAD  . LUMBAR DISC SURGERY  04/04/2012  . PORT-A-CATH REMOVAL N/A 11/15/2016   Procedure: REMOVAL PORT-A-CATH;  Surgeon: Rolm Bookbinder, MD;  Location: [place];  Service: General;  Laterality: N/A;  . PORTACATH PLACEMENT Right 04/02/2016   Procedure: INSERTION PORT-A-CATH WITH [place];  Surgeon: Rolm Bookbinder, MD;  Location: [place];  Service: General;  Laterality: Right;  . RADIOACTIVE SEED GUIDED PARTIAL MASTECTOMY/AXILLARY SENTINEL NODE BIOPSY/AXILLARY NODE DISSECTION Left 09/12/2016  Procedure: LEFT BREAST SEED GUIDED LUMPECTOMY, LEFT AXILLARY SENTINEL NODE BIOPSY, LEFT SEED GUIDED AXILLARY NODE EXCISION( TARGETED AXILLARY DISSECTION), BLUE DYE INJECTION;  Surgeon: Rolm Bookbinder, MD;  Location: [place];  Service: General;  Laterality: Left;  . SPINAL FUSION  5/12   L5-S1  . TARSAL TUNNEL RELEASE      Social history:  reports that she has never smoked. She has never used smokeless tobacco. She reports that she drinks alcohol. She  reports that she does not use drugs.   Allergies  Allergen Reactions  . Turmeric Other (See Comments)    Severe diarrhea  . Decadron [Dexamethasone]     Exacerbates restless leg    Family History  Problem Relation Age of Onset  . Heart disease Mother   . Pulmonary fibrosis Mother   . Hypertension Mother   . Cancer Father 76       astocytoma  . Hypertension Brother   . Lymphoma Maternal Aunt   . Anesthesia problems Neg Hx       Prior to Admission medications   Medication Sig Start Date End Date Taking? Authorizing Provider  aspirin EC 325 MG tablet Take 1 tablet (325 mg total) by mouth daily. 10/23/18  Yes Jola Schmidt, MD  buPROPion (WELLBUTRIN XL) 150 MG 24 hr tablet Take 150 mg by mouth daily. 03/06/16  Yes [provider]  Cholecalciferol (VITAMIN D3 PO) Take 1,000 mg by mouth every Monday, Wednesday, and Friday.    Yes [provider]  citalopram (CELEXA) 40 MG tablet Take 40 mg by mouth daily.  12/21/15  Yes [provider]  clonazePAM (KLONOPIN) 1 MG tablet Take 1 mg by mouth at bedtime. Dr Laurann Montana   Yes [provider]  docusate sodium (COLACE) 100 MG capsule Take 1 capsule (100 mg total) by mouth every 12 (twelve) hours. 10/23/18  Yes Jola Schmidt, MD  esomeprazole (NEXIUM) 20 MG capsule Take 1 capsule (20 mg total) by mouth daily at 12 noon. 04/22/17  Yes Nicholas Lose, MD  ferrous sulfate 325 (65 FE) MG EC tablet Take 325 mg by mouth at bedtime.    Yes [provider]  oxyCODONE-acetaminophen (PERCOCET/ROXICET) 5-325 MG tablet Take 1 tablet by mouth every 4 (four) hours as needed for severe pain. 10/23/18  Yes Jola Schmidt, MD  pramipexole (MIRAPEX) 0.5 MG tablet Take 1/2 tab at 4 pm and 1 tab at night time prn. Patient taking differently: Take 0.5 mg by mouth at bedtime.  09/15/18  Yes Dohmeier, Asencion Partridge, MD  zolpidem (AMBIEN) 5 MG tablet Take 5 mg by mouth at bedtime as needed for sleep. 09/17/18  Yes [provider]   HYDROcodone-acetaminophen (NORCO/VICODIN) 5-325 MG tablet Take 1 tablet by mouth every 6 (six) hours as needed for moderate pain. Patient not taking: Reported on 10/22/2018 09/16/18   Garald Balding, MD  NONFORMULARY OR COMPOUNDED [place]:  Antiinflammatory Cream - Diclofenac 35, Baclofen 2%, Lidocaine 2%, apply 1-2 grams to affected area 3-4 times daily. Patient not taking: Reported on 10/22/2018 03/01/17   Bronson Ing, DPM  nystatin-triamcinolone ointment [facility]) Apply 1 application topically 2 (two) times daily. Patient not taking: Reported on 10/22/2018 02/04/18   Huel Cote, NP    Physical Exam: Vitals:   10/24/18 0756 10/24/18 0814 10/24/18 0830 10/24/18 0845  BP:  (!) 106/49 (!) 108/59 (!) 95/54  Pulse: 67 71 63 61  Resp: 16 17  18   Temp: 99.9 F (37.7 C)  TempSrc: Oral     SpO2: 94% 93% 94% 96%  Weight:    87.1 kg  Height:    5\' 4"  (1.626 m)     Vitals:   10/24/18 0756 10/24/18 0814 10/24/18 0830 10/24/18 0845  BP:  (!) 106/49 (!) 108/59 (!) 95/54  Pulse: 67 71 63 61  Resp: 16 17  18   Temp: 99.9 F (37.7 C)     TempSrc: Oral     SpO2: 94% 93% 94% 96%  Weight:    87.1 kg  Height:    5\' 4"  (1.626 m)  Constitutional: Patient in mild distress due to neck pain, in c-collar Eyes: PERRL, lids and conjunctivae normal ENMT: Mucous membranes are moist. Posterior pharynx clear of any exudate or lesions.Normal dentition.  Neck: normal, supple, no masses, no thyromegaly Respiratory: clear to auscultation bilaterally, no wheezing, no crackles. Normal respiratory effort. No accessory muscle use.  Cardiovascular: Regular rate and rhythm, no murmurs / rubs / gallops. No extremity edema. 2+ pedal pulses. No carotid bruits.  Abdomen: no tenderness, no masses palpated. No hepatosplenomegaly. Bowel sounds positive.  Musculoskeletal: no clubbing / cyanosis. No joint deformity upper and lower extremities. Good ROM, no contractures. Normal muscle tone.    Neurologic: CN 2-12 grossly intact. Sensation intact, DTR normal. Strength 5/5 in all 4.  Psychiatric: Normal judgment and insight. Alert and oriented x 3. Normal mood.  SKIN/catheters: Superficial abrasions and bruises on bilateral lower extremities most significant being a large bruise along the posterior aspect of right knee/popliteal area  Labs on Admission: I have personally reviewed following labs and imaging studies  CBC: Recent Labs  Lab 10/22/18 2135 10/22/18 2206 10/24/18 0800  WBC 11.0*  --  7.7  NEUTROABS  --   --  5.6  HGB 13.0 14.3 12.2  HCT 41.5 42.0 41.0  MCV 95.4  --  98.6  PLT 206  --  235   Basic Metabolic Panel: Recent Labs  Lab 10/22/18 2135 10/22/18 2206  NA 137 138  K 3.9 3.8  CL 103 105  CO2 24  --   GLUCOSE 75 68*  BUN 16 19  CREATININE 0.97 1.00  CALCIUM 9.5  --    GFR: Estimated Creatinine Clearance: 61.5 mL/min (by C-G formula based on SCr of 1 mg/dL). Liver Function Tests: No results for input(s): AST, ALT, ALKPHOS, BILITOT, PROT, ALBUMIN in the last 168 hours. No results for input(s): LIPASE, AMYLASE in the last 168 hours. No results for input(s): AMMONIA in the last 168 hours. Coagulation Profile: No results for input(s): INR, PROTIME in the last 168 hours. Cardiac Enzymes: No results for input(s): CKTOTAL, CKMB, CKMBINDEX, TROPONINI in the last 168 hours. BNP (last 3 results) No results for input(s): PROBNP in the last 8760 hours. HbA1C: No results for input(s): HGBA1C in the last 72 hours. CBG: No results for input(s): GLUCAP in the last 168 hours. Lipid Profile: No results for input(s): CHOL, HDL, LDLCALC, TRIG, CHOLHDL, LDLDIRECT in the last 72 hours. Thyroid Function Tests: No results for input(s): TSH, T4TOTAL, FREET4, T3FREE, THYROIDAB in the last 72 hours. Anemia Panel: No results for input(s): VITAMINB12, FOLATE, FERRITIN, TIBC, IRON, RETICCTPCT in the last 72 hours. Urine analysis:    Component Value Date/Time    COLORURINE AMBER (A) 10/03/2015 0929   APPEARANCEUR CLEAR 10/03/2015 0929   LABSPEC 1.020 10/03/2015 0929   PHURINE 7.0 10/03/2015 0929   GLUCOSEU NEGATIVE 10/03/2015 0929   HGBUR NEGATIVE 10/03/2015 0929   BILIRUBINUR NEGATIVE 10/03/2015 3614  KETONESUR NEGATIVE 10/03/2015 0929   PROTEINUR NEGATIVE 10/03/2015 0929   UROBILINOGEN 1.0 10/03/2015 0929   NITRITE NEGATIVE 10/03/2015 0929   LEUKOCYTESUR NEGATIVE 10/03/2015 0929    Radiological Exams on Admission: Ct Head Wo Contrast  Result Date: 10/22/2018 CLINICAL DATA:  Pain after fall. EXAM: CT HEAD WITHOUT CONTRAST CT CERVICAL SPINE WITHOUT CONTRAST TECHNIQUE: Multidetector CT imaging of the head and cervical spine was performed following the standard protocol without intravenous contrast. Multiplanar CT image reconstructions of the cervical spine were also generated. COMPARISON:  None. FINDINGS: CT HEAD FINDINGS Brain: No evidence of acute infarction, hemorrhage, hydrocephalus, extra-axial collection or mass lesion/mass effect. Vascular: No hyperdense vessel or unexpected calcification. Skull: Normal. Negative for fracture or focal lesion. Sinuses/Orbits: No acute finding. Other: Hematoma over the superior scalp to the right of midline. CT CERVICAL SPINE FINDINGS Alignment: Slight anterolisthesis of C2 versus C3 of approximately 2 mm. Reversal of normal lordosis centered at C5. Minimal anterolisthesis of T1 versus T2 and T2 versus T3, 1 or 2 mm. No fractures identified at these levels. No other significant malalignment. Skull base and vertebrae: There are fractures through the bilateral lamina/posterior arch of C1 without displacement. C1 articulates normally with the occipital condyles and the anterior arch is intact. There is a complex fracture involving C2. On the right, there is a comminuted fracture through the inferior body of C2 with extension into the pedicle an vertebral foramen. The fracture extends slightly into the superior facet.  There is a displaced fracture through the left posterior C2 vertebral body with probable slight extension into the pedicle and marked involvement of the left vertebral foramen. There appears to be a displaced fracture fragment in the region of her the vertebral foramen. Soft tissues and spinal canal: There is blood posterior to C2. No other soft tissue abnormalities identified. Disc levels:  Multilevel degenerative changes. Upper chest: Negative. Other: No other abnormalities. IMPRESSION: 1. Nondisplaced fractures through the posterior arch/bilateral lamina of C1. 2. Complicated fracture of C2 involving a comminuted fracture of the right posterior body extending into the pedicle and vertebral foramen. Slight extension into the superior right facet. There is also a displaced fracture through the left C2 vertebral body with slight extension into the pedicle and significant involvement of the left vertebral foramen. There appears to be a displaced bone fragment in the region of the left vertebral foramen. Recommend a CTA for evaluation of the vertebral arteries. 3. Slight anterolisthesis of C2 versus C3. Given the fracture at C2, it would be difficult to exclude ligamentous injury. 4. Minimal anterolisthesis of T1 versus T2 and T2 versus T3, favored to be due to facet degenerative changes at these levels. 5. There is blood posterior to C2 consistent with the fracture in this location. 6. Degenerative changes. 7. No acute intracranial abnormalities. Findings called to Dr. Venora Maples. Electronically Signed   By: Dorise Bullion III M.D   On: 10/22/2018 21:29   Ct Angio Neck W And/or Wo Contrast  Result Date: 10/22/2018 CLINICAL DATA:  62 y/o F; C-spine fracture. History of breast cancer. EXAM: CT ANGIOGRAPHY NECK TECHNIQUE: Multidetector CT imaging of the neck was performed using the standard protocol during bolus administration of intravenous contrast. Multiplanar CT image reconstructions and MIPs were obtained to  evaluate the vascular anatomy. Carotid stenosis measurements (when applicable) are obtained utilizing NASCET criteria, using the distal internal carotid diameter as the denominator. CONTRAST:  75 cc Isovue 370 COMPARISON:  10/22/2018 CT head and cervical spine. FINDINGS: Aortic arch: Standard branching.  Imaged portion shows no evidence of aneurysm or dissection. No significant stenosis of the major arch vessel origins. Right carotid system: No evidence of dissection, stenosis (50% or greater) or occlusion. Left carotid system: No evidence of dissection, stenosis (50% or greater) or occlusion. Vertebral arteries: Mild lumen irregularity (series 10, image 131) without minimal stenosis and without dissection of the right vertebral artery just below the C1 foramen transversarium compatible with blunt vascular injury Denver grade 1. Moderate stenosis of the left vertebral artery traversing the left C2 foramen transversarium (series 11, image 101) with approximately 50% stenosis and no dissection flap. Findings may be due to external mass effect from the surrounding fracture displaced bony structures or intramural hematoma compatible with Denver grade 2 blunt vascular injury. Skeleton: Stable acute fractures through the bilateral lamina/posterior arch of C1 and stable complex comminuted fracture through the body of C2 with extension into pedicles and vertebral foramen. Mild spondylosis with multilevel disc and facet degenerative changes greatest at the C5-C7 levels. Other neck: Negative. Upper chest: Negative. IMPRESSION: 1. Right vertebral artery blunt vascular injury Denver grade 1 just below the C1 foramen transversarium. No dissection or significant stenosis. 2. Left vertebral artery moderate stenosis at C2 foramen transversarium without dissection. Findings may be due to external mass effect from displaced fractures or Denver grade 2 blunt vascular injury due to intramural hematoma. 3. Widely patent carotid systems.  4. Stable acute fractures of C1 and C2 as described on prior study. These results were called by telephone at the time of interpretation on 10/22/2018 at 11:25 pm to Dr. Jola Schmidt , who verbally acknowledged these results. Electronically Signed   By: Kristine Garbe M.D.   On: 10/22/2018 23:28   Ct Cervical Spine Wo Contrast  Result Date: 10/22/2018 CLINICAL DATA:  Pain after fall. EXAM: CT HEAD WITHOUT CONTRAST CT CERVICAL SPINE WITHOUT CONTRAST TECHNIQUE: Multidetector CT imaging of the head and cervical spine was performed following the standard protocol without intravenous contrast. Multiplanar CT image reconstructions of the cervical spine were also generated. COMPARISON:  None. FINDINGS: CT HEAD FINDINGS Brain: No evidence of acute infarction, hemorrhage, hydrocephalus, extra-axial collection or mass lesion/mass effect. Vascular: No hyperdense vessel or unexpected calcification. Skull: Normal. Negative for fracture or focal lesion. Sinuses/Orbits: No acute finding. Other: Hematoma over the superior scalp to the right of midline. CT CERVICAL SPINE FINDINGS Alignment: Slight anterolisthesis of C2 versus C3 of approximately 2 mm. Reversal of normal lordosis centered at C5. Minimal anterolisthesis of T1 versus T2 and T2 versus T3, 1 or 2 mm. No fractures identified at these levels. No other significant malalignment. Skull base and vertebrae: There are fractures through the bilateral lamina/posterior arch of C1 without displacement. C1 articulates normally with the occipital condyles and the anterior arch is intact. There is a complex fracture involving C2. On the right, there is a comminuted fracture through the inferior body of C2 with extension into the pedicle an vertebral foramen. The fracture extends slightly into the superior facet. There is a displaced fracture through the left posterior C2 vertebral body with probable slight extension into the pedicle and marked involvement of the left  vertebral foramen. There appears to be a displaced fracture fragment in the region of her the vertebral foramen. Soft tissues and spinal canal: There is blood posterior to C2. No other soft tissue abnormalities identified. Disc levels:  Multilevel degenerative changes. Upper chest: Negative. Other: No other abnormalities. IMPRESSION: 1. Nondisplaced fractures through the posterior arch/bilateral lamina of C1. 2. Complicated fracture  of C2 involving a comminuted fracture of the right posterior body extending into the pedicle and vertebral foramen. Slight extension into the superior right facet. There is also a displaced fracture through the left C2 vertebral body with slight extension into the pedicle and significant involvement of the left vertebral foramen. There appears to be a displaced bone fragment in the region of the left vertebral foramen. Recommend a CTA for evaluation of the vertebral arteries. 3. Slight anterolisthesis of C2 versus C3. Given the fracture at C2, it would be difficult to exclude ligamentous injury. 4. Minimal anterolisthesis of T1 versus T2 and T2 versus T3, favored to be due to facet degenerative changes at these levels. 5. There is blood posterior to C2 consistent with the fracture in this location. 6. Degenerative changes. 7. No acute intracranial abnormalities. Findings called to Dr. Venora Maples. Electronically Signed   By: Dorise Bullion III M.D   On: 10/22/2018 21:29   Dg Chest Portable 1 View  Result Date: 10/22/2018 CLINICAL DATA:  Fall from ladder.  History of breast cancer. EXAM: PORTABLE CHEST 1 VIEW COMPARISON:  Chest radiograph Apr 02, 2016 FINDINGS: Cardiomediastinal silhouette is normal. No pleural effusions or focal consolidations. LEFT lung base calcified granuloma. Calcified mediastinal lymph nodes. Trachea projects midline and there is no pneumothorax. Soft tissue planes and included osseous structures are non-suspicious. LEFT breast surgical clips. Interval removal of  RIGHT Port-A-Cath without retained fragments. IMPRESSION: 1. No acute cardiopulmonary process. 2. Old granulomatous disease. 3. Interval removal of RIGHT chest Port-A-Cath. Electronically Signed   By: Elon Alas M.D.   On: 10/22/2018 20:59       Assessment and Plan:   1.  Recent mechanical fall, C1-C2 vertebral fracture: Will admit with morphine PCA per patient's request and given severe injury.  Narcan as needed for signs of unintentional overdose/sedation.  Watch very closely for signs of respiratory depression given vertebral fracture and high risk if needs intubation.  Swallow evaluation for safe diet.  Likely will be okay with soft diet. Taper off IV pain medications and transition to oral as soon as possible.  Neurosurgery will continue to follow along.  No recommendations for steroids at this time.  2.  Vertebral artery compression stenosis, scalp hematoma: Hold aspirin as risks outweigh benefits at this time.  Watch hemoglobin given these findings as well as significant bruising in lower extremities.  3.  Restless leg syndrome/depression: Resume home medications  4.  Breast cancer: Status post surgery radiation and chemotherapy.  DVT prophylaxis: Avoid anticoagulants given problem #2.  SCDs  Code Status: Full code as confirmed with patient and husband bedside  Family Communication: Discussed with patient and family. Health care proxy would be her husband Consults called: Neurosurgery Admission status:  Patient admitted as observation as anticipated LOS less than 2 midnights    Guilford Shi MD [facility] Pager [phone]  If 7PM-7AM, please contact night-coverage [email or web address] Password [facility]  10/24/2018, 9:19 AM

## 2018-10-24 NOTE — Progress Notes (Signed)
Assumed care from [place]. Patient stable and sleeping at the moment.

## 2018-10-24 NOTE — Progress Notes (Addendum)
Morphine PCA per protocol   Patient with recent C1-C2 vertebral fracture with new intractable pain.   Asked by hospitalist to enter Morphine PCA for patient. D/w MD, orders entered with no load, 0.5 mg up to every 10 minutes (lockout) for a total of 3 mg per hour. Orders for narcan entered, and orders for intermittent prn morphine and oxycodone d/c'd.   Monitor patient for respiratory depression and pain relief Pharmacy will sign off, but follow along peripherally  Oree Mirelez A. Levada Dy, PharmD, [place] Pager: [phone] Please utilize Amion for appropriate phone number to reach the unit pharmacist ([place])

## 2018-10-25 ENCOUNTER — Inpatient Hospital Stay (HOSPITAL_COMMUNITY): Payer: Medicare Other

## 2018-10-25 ENCOUNTER — Encounter (HOSPITAL_COMMUNITY): Payer: Self-pay | Admitting: Neurological Surgery

## 2018-10-25 DIAGNOSIS — S12120A Other displaced dens fracture, initial encounter for closed fracture: Secondary | ICD-10-CM | POA: Diagnosis present

## 2018-10-25 DIAGNOSIS — G2581 Restless legs syndrome: Secondary | ICD-10-CM | POA: Diagnosis present

## 2018-10-25 DIAGNOSIS — K219 Gastro-esophageal reflux disease without esophagitis: Secondary | ICD-10-CM | POA: Diagnosis present

## 2018-10-25 DIAGNOSIS — S12101S Unspecified nondisplaced fracture of second cervical vertebra, sequela: Secondary | ICD-10-CM | POA: Diagnosis not present

## 2018-10-25 DIAGNOSIS — E875 Hyperkalemia: Secondary | ICD-10-CM | POA: Diagnosis present

## 2018-10-25 DIAGNOSIS — Z789 Other specified health status: Secondary | ICD-10-CM | POA: Diagnosis not present

## 2018-10-25 DIAGNOSIS — Z853 Personal history of malignant neoplasm of breast: Secondary | ICD-10-CM | POA: Diagnosis not present

## 2018-10-25 DIAGNOSIS — S12031A Nondisplaced posterior arch fracture of first cervical vertebra, initial encounter for closed fracture: Secondary | ICD-10-CM | POA: Diagnosis present

## 2018-10-25 DIAGNOSIS — R52 Pain, unspecified: Secondary | ICD-10-CM | POA: Diagnosis present

## 2018-10-25 DIAGNOSIS — Z803 Family history of malignant neoplasm of breast: Secondary | ICD-10-CM | POA: Diagnosis not present

## 2018-10-25 DIAGNOSIS — S12100A Unspecified displaced fracture of second cervical vertebra, initial encounter for closed fracture: Secondary | ICD-10-CM | POA: Diagnosis not present

## 2018-10-25 DIAGNOSIS — Z8249 Family history of ischemic heart disease and other diseases of the circulatory system: Secondary | ICD-10-CM | POA: Diagnosis not present

## 2018-10-25 DIAGNOSIS — F329 Major depressive disorder, single episode, unspecified: Secondary | ICD-10-CM | POA: Diagnosis present

## 2018-10-25 DIAGNOSIS — S15102A Unspecified injury of left vertebral artery, initial encounter: Secondary | ICD-10-CM | POA: Diagnosis present

## 2018-10-25 DIAGNOSIS — Z981 Arthrodesis status: Secondary | ICD-10-CM | POA: Diagnosis not present

## 2018-10-25 DIAGNOSIS — S12190A Other displaced fracture of second cervical vertebra, initial encounter for closed fracture: Secondary | ICD-10-CM | POA: Diagnosis not present

## 2018-10-25 DIAGNOSIS — M5030 Other cervical disc degeneration, unspecified cervical region: Secondary | ICD-10-CM | POA: Diagnosis present

## 2018-10-25 DIAGNOSIS — S15101A Unspecified injury of right vertebral artery, initial encounter: Secondary | ICD-10-CM | POA: Diagnosis present

## 2018-10-25 DIAGNOSIS — S0003XA Contusion of scalp, initial encounter: Secondary | ICD-10-CM | POA: Diagnosis present

## 2018-10-25 DIAGNOSIS — G4733 Obstructive sleep apnea (adult) (pediatric): Secondary | ICD-10-CM | POA: Diagnosis present

## 2018-10-25 DIAGNOSIS — Z7982 Long term (current) use of aspirin: Secondary | ICD-10-CM | POA: Diagnosis not present

## 2018-10-25 DIAGNOSIS — Z923 Personal history of irradiation: Secondary | ICD-10-CM | POA: Diagnosis not present

## 2018-10-25 DIAGNOSIS — N951 Menopausal and female climacteric states: Secondary | ICD-10-CM | POA: Diagnosis present

## 2018-10-25 DIAGNOSIS — Z9012 Acquired absence of left breast and nipple: Secondary | ICD-10-CM | POA: Diagnosis not present

## 2018-10-25 DIAGNOSIS — W11XXXA Fall on and from ladder, initial encounter: Secondary | ICD-10-CM | POA: Diagnosis present

## 2018-10-25 DIAGNOSIS — Z79899 Other long term (current) drug therapy: Secondary | ICD-10-CM | POA: Diagnosis not present

## 2018-10-25 DIAGNOSIS — Z807 Family history of other malignant neoplasms of lymphoid, hematopoietic and related tissues: Secondary | ICD-10-CM | POA: Diagnosis not present

## 2018-10-25 DIAGNOSIS — G629 Polyneuropathy, unspecified: Secondary | ICD-10-CM | POA: Diagnosis present

## 2018-10-25 DIAGNOSIS — Z9049 Acquired absence of other specified parts of digestive tract: Secondary | ICD-10-CM | POA: Diagnosis not present

## 2018-10-25 DIAGNOSIS — F419 Anxiety disorder, unspecified: Secondary | ICD-10-CM | POA: Diagnosis present

## 2018-10-25 DIAGNOSIS — S12000A Unspecified displaced fracture of first cervical vertebra, initial encounter for closed fracture: Secondary | ICD-10-CM | POA: Diagnosis not present

## 2018-10-25 LAB — BASIC METABOLIC PANEL
Anion gap: 5 (ref 5–15)
BUN: 10 mg/dL (ref 8–23)
CO2: 31 mmol/L (ref 22–32)
Calcium: 8.7 mg/dL — ABNORMAL LOW (ref 8.9–10.3)
Chloride: 103 mmol/L (ref 98–111)
Creatinine, Ser: 0.92 mg/dL (ref 0.44–1.00)
GFR calc Af Amer: >60 mL/min (ref >60)
GFR calc non Af Amer: >60 mL/min (ref >60)
Glucose, Bld: 95 mg/dL (ref 70–99)
Potassium: 3.8 mmol/L (ref 3.5–5.1)
Sodium: 139 mmol/L (ref 135–145)

## 2018-10-25 LAB — CBC
HCT: 38.7 % (ref 36.0–46.0)
Hemoglobin: 11.8 g/dL — ABNORMAL LOW (ref 12.0–15.0)
MCH: 30.1 pg (ref 26.0–34.0)
MCHC: 30.5 g/dL (ref 30.0–36.0)
MCV: 98.7 fL (ref 80.0–100.0)
Platelets: 213 10*3/uL (ref 150–400)
RBC: 3.92 MIL/uL (ref 3.87–5.11)
RDW: 14.1 % (ref 11.5–15.5)
WBC: 6.4 10*3/uL (ref 4.0–10.5)
nRBC: 0 % (ref 0.0–0.2)

## 2018-10-25 LAB — HIV ANTIBODY (ROUTINE TESTING W REFLEX): HIV Screen 4th Generation wRfx: NONREACTIVE

## 2018-10-25 MED ORDER — METHOCARBAMOL 500 MG PO TABS
500.0000 mg | ORAL_TABLET | Freq: Three times a day (TID) | ORAL | Status: DC
  Administered 2018-10-25 – 2018-10-27 (×7): 500 mg via ORAL
  Filled 2018-10-25 (×7): qty 1

## 2018-10-25 NOTE — Progress Notes (Signed)
Progress Note    Alexis Fox  SHF:[id number] DOB: September 05, 1956  DOA: 10/24/2018 PCP: [name], MD    Brief Narrative:     Medical records reviewed and are as summarized below:  Alexis Fox is an 62 y.o. female with history h/o depression, GERD, left breast cancer status post chemotherapy/surgery/radiation who is pretty functional at baseline and was a former Therapist, sports at [employer] presents with complaints of refractory severe neck pain since a fall 2 days back.  On Wednesday, patient was on her driveway using a 6-step ladder to cover her RV when she lost balance and landed on gravel.  She states she immediately knew she injured her neck.  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  She was brought to the ED and work-up revealed C1-C2 vertebral fracture with displaced fragments and grade 2 blunt vascular injury to vertebral artery (described as moderate stenosis either due to external compression by bony fragments or intramural hematoma).  CT head did not reveal any intracranial injury but reported superior scalp hematoma to the right of midline.  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  Assessment/Plan:   Active Problems:   Fall   Pain managed using patient-controlled analgesia (PCA)  Recent mechanical fall, C1-C2 vertebral fracture:  -no neurologic complaints (numbness/tingling etc) - continue morphine PCA for today with added robaxin-- pain is still not controlled -at home pain was not controlled on 1-2 5 mg percocets  -could try PRN dilaudid (husband has been on in the past) vs long acting plus IR oxycodone -bowel regimen -has been seen by Dr. Ronnald Ramp-- plan to repeat plan x rays today  Hyperkalemia -on 11/22- blood was hemolyzed -repeat ordered and is pending  Vertebral artery compression stenosis, scalp hematoma:   -monitor  Restless leg syndrome/depression:  -Resume home medications  Breast cancer:  -status post surgery radiation and chemotherapy.  OSA -being worked up outpatient for CPAP  Family Communication/Anticipated D/C date and plan/Code Status   DVT prophylaxis: scd Code Status: Full Code.  Family Communication: husband at bedside Disposition Plan: change to inpatient due to continued need for PCA pump and medication adjustment   Medical Consultants:    NS     Subjective:   No numbness or tingling Slightly dizzy when sitting up  Objective:    Vitals:   10/25/18 0338 10/25/18 0527 10/25/18 0735 10/25/18 1200  BP:  (!) 104/55    Pulse:  (!) 59    Resp: 16 16 11 12   Temp:  98.4 F (36.9 C)    TempSrc:  Oral    SpO2: 98% 99% 96% 96%  Weight:      Height:        Intake/Output Summary (Last 24 hours) at 10/25/2018 1516 Last data filed at 10/24/2018 1652 Gross per 24 hour  Intake 10 ml  Output -  Net 10 ml   Filed Weights   10/24/18 0845  Weight: 87.1 kg    Exam: In bed, uncomfortable appearing Wearing cervical collar No LE edema Moves all 4 ext  Data Reviewed:   I have personally reviewed following labs and imaging studies:  Labs: Labs show the following:   Basic Metabolic Panel: Recent Labs  Lab 10/22/18 2135 10/22/18 2206 10/24/18 0800  NA 137 138 134*  K 3.9 3.8 6.4*  CL 103 105 102  CO2 24  --  26  GLUCOSE 75 68* 95  BUN 16 19 12   CREATININE 0.97 1.00 0.78  CALCIUM 9.5  --  8.7*   GFR Estimated Creatinine Clearance: 76.9 mL/min (by C-G formula based on SCr of 0.78 mg/dL). Liver Function Tests: No results for input(s): AST, ALT, ALKPHOS, BILITOT, PROT, ALBUMIN in the last 168 hours. No results for input(s): LIPASE, AMYLASE in the last 168 hours. No results for input(s): AMMONIA in the last 168 hours. Coagulation profile No results for input(s): INR, PROTIME in the last 168 hours.  CBC: Recent Labs  Lab 10/22/18 2135  10/22/18 2206 10/24/18 0800 10/25/18 0335  WBC 11.0*  --  7.7 6.4  NEUTROABS  --   --  5.6  --   HGB 13.0 14.3 12.2 11.8*  HCT 41.5 42.0 41.0 38.7  MCV 95.4  --  98.6 98.7  PLT 206  --  203 213   Cardiac Enzymes: No results for input(s): CKTOTAL, CKMB, CKMBINDEX, TROPONINI in the last 168 hours. BNP (last 3 results) No results for input(s): PROBNP in the last 8760 hours. CBG: No results for input(s): GLUCAP in the last 168 hours. D-Dimer: No results for input(s): DDIMER in the last 72 hours. Hgb A1c: No results for input(s): HGBA1C in the last 72 hours. Lipid Profile: No results for input(s): CHOL, HDL, LDLCALC, TRIG, CHOLHDL, LDLDIRECT in the last 72 hours. Thyroid function studies: No results for input(s): TSH, T4TOTAL, T3FREE, THYROIDAB in the last 72 hours.  Invalid input(s): FREET3 Anemia work up: No results for input(s): VITAMINB12, FOLATE, FERRITIN, TIBC, IRON, RETICCTPCT in the last 72 hours. Sepsis Labs: Recent Labs  Lab 10/22/18 2135 10/24/18 0800 10/25/18 0335  WBC 11.0* 7.7 6.4    Microbiology No results found for this or any previous visit (from the past 240 hour(s)).  Procedures and diagnostic studies:  No results found.  Medications:   . buPROPion  150 mg Oral Daily  . citalopram  40 mg Oral QHS  . clonazePAM  1 mg Oral QHS  . docusate sodium  100 mg Oral Q12H  . ferrous sulfate  325 mg Oral QHS  . methocarbamol  500 mg Oral TID  . morphine   Intravenous Q4H  . pantoprazole  40 mg Oral Daily  . pramipexole  0.5 mg Oral QHS   Continuous Infusions:   LOS: 0 days   Geradine Girt  [facility]   *Please refer to [email or web address], password [email or web address] to get updated schedule on who will round on this patient, as hospitalists switch teams weekly. If 7PM-7AM, please contact night-coverage at [email or web address], password [email or web address] for any overnight needs.  10/25/2018, 3:16 PM

## 2018-10-25 NOTE — Consult Note (Signed)
Reason for Consult:C2 fx Referring Physician: hospitalist  Alexis Fox is an 62 y.o. female.   HPI:  62 year old female who fell off a ladder on Wednesday and landed on the back of her neck.  She presented with a C2 and C1 fracture.  She was discharged from the emergency department in a cervical collar.  Her pain has remained severe.  It is constant.  It has not progressed.  It really has not improved.  No arm pain or numbness or tingling or weakness.  Some radiation to the back of the head.  Pain is sharp and severe.  She is in a cervical collar.  Admitted for pain control.  Past Medical History:  Diagnosis Date  . Anxiety   . Arthritis    knees  . Breast cancer ([place])   . Breast cancer of upper-outer quadrant of left female breast ([place]) 03/20/2016  . Cholecystitis   . Complication of anesthesia    BP "crashes" post op  . Depression   . Family history of brain cancer   . Family history of breast cancer   . Hot flashes   . Personal history of radiation therapy   . Restless leg syndrome   . Sleep apnea 10/03/2018    Past Surgical History:  Procedure Laterality Date  . BREAST BIOPSY    . BREAST LUMPECTOMY    . BUNIONECTOMY    . CHOLECYSTECTOMY N/A 10/04/2015   Procedure: LAPAROSCOPIC CHOLECYSTECTOMY WITH INTRAOPERATIVE CHOLANGIOGRAM;  Surgeon: Greer Pickerel, MD;  Location: [place];  Service: General;  Laterality: N/A;  . ELBOW SURGERY     right for epicondylitis 2010   . FOOT SURGERY     1983 -tarsal tunnel release  . IR GENERIC HISTORICAL  10/26/2016   IR CV LINE INJECTION 10/26/2016 [facility]  . LUMBAR DISC SURGERY  04/04/2012  . PORT-A-CATH REMOVAL N/A 11/15/2016   Procedure: REMOVAL PORT-A-CATH;  Surgeon: Rolm Bookbinder, MD;  Location: [place];  Service: General;  Laterality: N/A;  . PORTACATH PLACEMENT Right 04/02/2016   Procedure: INSERTION PORT-A-CATH WITH [place];  Surgeon: Rolm Bookbinder, MD;  Location: [place];  Service: General;   Laterality: Right;  . RADIOACTIVE SEED GUIDED PARTIAL MASTECTOMY/AXILLARY SENTINEL NODE BIOPSY/AXILLARY NODE DISSECTION Left 09/12/2016   Procedure: LEFT BREAST SEED GUIDED LUMPECTOMY, LEFT AXILLARY SENTINEL NODE BIOPSY, LEFT SEED GUIDED AXILLARY NODE EXCISION( TARGETED AXILLARY DISSECTION), BLUE DYE INJECTION;  Surgeon: Rolm Bookbinder, MD;  Location: [place];  Service: General;  Laterality: Left;  . SPINAL FUSION  5/12   L5-S1  . TARSAL TUNNEL RELEASE      Allergies  Allergen Reactions  . Turmeric Other (See Comments)    Severe diarrhea  . Decadron [Dexamethasone]     Exacerbates restless leg    Social History   Tobacco Use  . Smoking status: Never Smoker  . Smokeless tobacco: Never Used  Substance Use Topics  . Alcohol use: Yes    Alcohol/week: 1.0 standard drinks    Types: 1 Cans of beer per week    Family History  Problem Relation Age of Onset  . Heart disease Mother   . Pulmonary fibrosis Mother   . Hypertension Mother   . Cancer Father 45       astocytoma  . Hypertension Brother   . Lymphoma Maternal Aunt   . Anesthesia problems Neg Hx      Review of Systems  Positive ROS: As above  All other systems have been reviewed  and were otherwise negative with the exception of those mentioned in the HPI and as above.  Objective: Vital signs in last 24 hours: Temp:  [98.2 F (36.8 C)-99.3 F (37.4 C)] 98.4 F (36.9 C) (11/23 0527) Pulse Rate:  [59-68] 59 (11/23 0527) Resp:  [11-17] 11 (11/23 0735) BP: (95-111)/(52-57) 104/55 (11/23 0527) SpO2:  [94 %-99 %] 96 % (11/23 0735) FiO2 (%):  [65 %-66 %] 66 % (11/23 0338)  General Appearance: Alert, cooperative, no distress, appears stated age Head: Normocephalic, without obvious abnormality, atraumatic Eyes: PERRL, conjunctiva/corneas clear, EOM'Fox intact   Neck: In collar Lungs:  respirations unlabored Heart: Regular rate and rhythm Abdomen: Soft Extremities: Extremities normal, atraumatic, no  cyanosis or edema Pulses: 2+ and symmetric all extremities Skin: Skin color, texture, turgor normal, no rashes or lesions  NEUROLOGIC:   Mental status: A&O x4, no aphasia, good attention span, Memory and fund of knowledge Motor Exam - grossly normal, normal tone and bulk Sensory Exam - grossly normal Reflexes: symmetric, no pathologic reflexes, No Hoffman'Fox, No clonus Coordination - grossly normal Gait -not tested Balance -not tested Cranial Nerves: I: smell Not tested  II: visual acuity  OS: na    OD: na  II: visual fields Full to confrontation  II: pupils Equal, round, reactive to light  III,VII: ptosis None  III,IV,VI: extraocular muscles  Full ROM  V: mastication Normal  V: facial light touch sensation  Normal  V,VII: corneal reflex  Present  VII: facial muscle function - upper  Normal  VII: facial muscle function - lower Normal  VIII: hearing Not tested  IX: soft palate elevation  Normal  IX,X: gag reflex Present  XI: trapezius strength  5/5  XI: sternocleidomastoid strength 5/5  XI: neck flexion strength  5/5  XII: tongue strength  Normal    Data Review Lab Results  Component Value Date   WBC 6.4 10/25/2018   HGB 11.8 (L) 10/25/2018   HCT 38.7 10/25/2018   MCV 98.7 10/25/2018   PLT 213 10/25/2018   Lab Results  Component Value Date   NA 134 (L) 10/24/2018   K 6.4 (HH) 10/24/2018   CL 102 10/24/2018   CO2 26 10/24/2018   BUN 12 10/24/2018   CREATININE 0.78 10/24/2018   GLUCOSE 95 10/24/2018   No results found for: INR, PROTIME  Radiology: No results found. CT scan reviewed  Assessment/Plan: Estimated body mass index is 32.96 kg/m as calculated from the following:   Height as of this encounter: 5\' 4"  (1.626 m).   Weight as of this encounter: 57.63 kg.   62 year old female with a C1 and C2 fracture.  This should be a stable fracture and it should heal in a collar.  I hope will acts like a hangman'Fox fracture.  We will repeat plain x-rays.  Continue  pain control.  Continue collar.   Alexis Fox 10/25/2018 10:36 AM

## 2018-10-25 NOTE — Progress Notes (Signed)
Reported to [place], RN on 5N.

## 2018-10-25 NOTE — Evaluation (Signed)
Clinical/Bedside Swallow Evaluation Patient Details  Name: Alexis Fox MRN: [id number] Date of Birth: 04/15/1956  Today's Date: 10/25/2018 Time: SLP Start Time (ACUTE ONLY): 1115 SLP Stop Time (ACUTE ONLY): 1130 SLP Time Calculation (min) (ACUTE ONLY): 15 min  Past Medical History:  Past Medical History:  Diagnosis Date  . Anxiety   . Arthritis    knees  . Breast cancer ([place])   . Breast cancer of upper-outer quadrant of left female breast ([place]) 03/20/2016  . Cholecystitis   . Complication of anesthesia    BP "crashes" post op  . Depression   . Family history of brain cancer   . Family history of breast cancer   . Hot flashes   . Personal history of radiation therapy   . Restless leg syndrome   . Sleep apnea 10/03/2018   Past Surgical History:  Past Surgical History:  Procedure Laterality Date  . BREAST BIOPSY    . BREAST LUMPECTOMY    . BUNIONECTOMY    . CHOLECYSTECTOMY N/A 10/04/2015   Procedure: LAPAROSCOPIC CHOLECYSTECTOMY WITH INTRAOPERATIVE CHOLANGIOGRAM;  Surgeon: Greer Pickerel, MD;  Location: [place];  Service: General;  Laterality: N/A;  . ELBOW SURGERY     right for epicondylitis 2010   . FOOT SURGERY     1983 -tarsal tunnel release  . IR GENERIC HISTORICAL  10/26/2016   IR CV LINE INJECTION 10/26/2016 [facility]  . LUMBAR DISC SURGERY  04/04/2012  . PORT-A-CATH REMOVAL N/A 11/15/2016   Procedure: REMOVAL PORT-A-CATH;  Surgeon: Rolm Bookbinder, MD;  Location: [place];  Service: General;  Laterality: N/A;  . PORTACATH PLACEMENT Right 04/02/2016   Procedure: INSERTION PORT-A-CATH WITH [place];  Surgeon: Rolm Bookbinder, MD;  Location: [place];  Service: General;  Laterality: Right;  . RADIOACTIVE SEED GUIDED PARTIAL MASTECTOMY/AXILLARY SENTINEL NODE BIOPSY/AXILLARY NODE DISSECTION Left 09/12/2016   Procedure: LEFT BREAST SEED GUIDED LUMPECTOMY, LEFT AXILLARY SENTINEL NODE BIOPSY, LEFT SEED GUIDED AXILLARY NODE EXCISION( TARGETED  AXILLARY DISSECTION), BLUE DYE INJECTION;  Surgeon: Rolm Bookbinder, MD;  Location: [place];  Service: General;  Laterality: Left;  . SPINAL FUSION  5/12   L5-S1  . TARSAL TUNNEL RELEASE     HPI:  62 year old female who fell off a ladder on 10/22/18 and landed on the back of her neck.  She presented with a C2 and C1 fracture. She was discharged from the emergency department in a cervical collar, returned 10/24/18 with severe pain, admitted for pain control. Hx noted for breast cancer and radiation therapy, depression, sleep apnea, anxiety.   Assessment / Plan / Recommendation Clinical Impression    Patient presents with oropharyngeal swallow which appears at bedside to be within functional limits with adequate airway protection. No overt signs of aspiration observed despite challenging with consecutive straw sips of thin liquids in excess of 3oz, or with any other consistency (puree, regular solids). Despite being drowsy, pt able to follow all commands, attend to POs and self-feed. She denies globus sensation or discomfort with swallowing. Recommend regular diet with thin liquids, no further skilled ST needs identified. Will s/o.   SLP Visit Diagnosis: Dysphagia, unspecified (R13.10)    Aspiration Risk  Mild aspiration risk    Diet Recommendation Regular;Thin liquid   Liquid Administration via: Cup;Straw Medication Administration: Whole meds with liquid Supervision: Patient able to self feed    Other  Recommendations Oral Care Recommendations: Oral care BID   Follow up Recommendations None      Frequency  and Duration (evaluation only)          Prognosis Prognosis for Safe Diet Advancement: Good      Swallow Study   General Date of Onset: 10/24/18 HPI: 62 year old female who fell off a ladder on 10/22/18 and landed on the back of her neck.  She presented with a C2 and C1 fracture. She was discharged from the emergency department in a cervical collar, returned  10/24/18 with severe pain, admitted for pain control. Hx noted for breast cancer and radiation therapy, depression, sleep apnea, anxiety. Type of Study: Bedside Swallow Evaluation Previous Swallow Assessment: none in chart Diet Prior to this Study: Regular;Thin liquids Temperature Spikes Noted: No Respiratory Status: Nasal cannula History of Recent Intubation: No Behavior/Cognition: Cooperative;Lethargic/Drowsy Oral Cavity Assessment: Within Functional Limits Oral Care Completed by SLP: No Oral Cavity - Dentition: Adequate natural dentition Vision: Functional for self-feeding Self-Feeding Abilities: Able to feed self Patient Positioning: Upright in bed Baseline Vocal Quality: Normal Volitional Cough: Strong Volitional Swallow: Able to elicit    Oral/Motor/Sensory Function Overall Oral Motor/Sensory Function: Within functional limits   Ice Chips Ice chips: Not tested   Thin Liquid Thin Liquid: Within functional limits Presentation: Straw;Self Fed    Nectar Thick Nectar Thick Liquid: Not tested   Honey Thick Honey Thick Liquid: Not tested   Puree Puree: Within functional limits Presentation: Self Fed;Spoon   Solid     Solid: Within functional limits Presentation: [address], [place], [place] Speech-Language Pathologist Acute Rehabilitation Services Pager: [phone] Office: [phone]  Aliene Altes 10/25/2018,12:07 PM

## 2018-10-26 ENCOUNTER — Inpatient Hospital Stay (HOSPITAL_COMMUNITY): Payer: Medicare Other

## 2018-10-26 DIAGNOSIS — S12101S Unspecified nondisplaced fracture of second cervical vertebra, sequela: Secondary | ICD-10-CM

## 2018-10-26 MED ORDER — OXYCODONE HCL ER 15 MG PO T12A
15.0000 mg | EXTENDED_RELEASE_TABLET | Freq: Two times a day (BID) | ORAL | Status: DC
  Administered 2018-10-26 – 2018-10-27 (×3): 15 mg via ORAL
  Filled 2018-10-26 (×3): qty 1

## 2018-10-26 MED ORDER — MORPHINE SULFATE (PF) 2 MG/ML IV SOLN
1.0000 mg | INTRAVENOUS | Status: DC | PRN
  Administered 2018-10-26 – 2018-10-27 (×2): 2 mg via INTRAVENOUS
  Filled 2018-10-26 (×2): qty 1

## 2018-10-26 MED ORDER — POLYETHYLENE GLYCOL 3350 17 G PO PACK
17.0000 g | PACK | Freq: Two times a day (BID) | ORAL | Status: DC
  Administered 2018-10-26 – 2018-10-27 (×2): 17 g via ORAL
  Filled 2018-10-26 (×2): qty 1

## 2018-10-26 MED ORDER — ASPIRIN 325 MG PO TABS
325.0000 mg | ORAL_TABLET | Freq: Every day | ORAL | Status: DC
  Administered 2018-10-26 – 2018-10-27 (×2): 325 mg via ORAL
  Filled 2018-10-26 (×2): qty 1

## 2018-10-26 MED ORDER — HEPARIN SODIUM (PORCINE) 5000 UNIT/ML IJ SOLN: 5000.0000 [IU] | Freq: Three times a day (TID) | INTRAMUSCULAR | Status: DC

## 2018-10-26 MED ORDER — SENNOSIDES-DOCUSATE SODIUM 8.6-50 MG PO TABS
3.0000 | ORAL_TABLET | Freq: Two times a day (BID) | ORAL | Status: DC
  Administered 2018-10-26 – 2018-10-27 (×3): 3 via ORAL
  Filled 2018-10-26 (×3): qty 3

## 2018-10-26 NOTE — Progress Notes (Signed)
Patient ID: Alexis Fox, female   DOB: 02-15-1956, 62 y.o.   MRN: [id number] I have spoken with her at length.  She complains of 2 episodes of vertigo.  Once was turning over in bed and once was standing in the bathroom.  This may be postconcussive.  Sharp neck pain is much better.  It is now moderate and controllable.  She remains in her cervical collar.  She denies facial numbness or arm numbness or double vision or difficulty swallowing.  Recommended MRI of the cervical spine without contrast.  Spoken to her and her daughter at length about the fracture, the prognosis, typical recovery periods and typical indications for surgery.  I have tried to answer all of their questions to the best of my ability.  They seem satisfied.

## 2018-10-26 NOTE — Plan of Care (Signed)
  Problem: Coping: Goal: Level of anxiety will decrease Outcome: Progressing   Problem: Pain Managment: Goal: General experience of comfort will improve Outcome: Progressing   Problem: Safety: Goal: Ability to remain free from injury will improve Outcome: Progressing   Problem: Skin Integrity: Goal: Risk for impaired skin integrity will decrease Outcome: Progressing   

## 2018-10-26 NOTE — Progress Notes (Signed)
Paged MD regarding pt's complaints of dizziness and headache. Awaiting response.

## 2018-10-26 NOTE — Evaluation (Signed)
Physical Therapy Evaluation Patient Details Name: Alexis Fox MRN: [id number] DOB: Jun 16, 1956 Today's Date: 10/26/2018   History of Present Illness  62 year old female who fell off a ladder on 10/22/18 and landed on the back of her neck.  She presented with a C2 and C1 fracture. She was discharged from the emergency department in a cervical collar, returned 10/24/18 with severe pain, admitted for pain control. Hx noted for breast cancer and radiation therapy, depression, sleep apnea, anxiety.   Clinical Impression   Patient evaluated by Physical Therapy with no further acute PT needs identified. All education has been completed and the patient has no further questions. She is well-versed in Cervical collar use and cervical precautions.  Overall walking slowly and guardedly. See below for any follow-up Physical Therapy or equipment needs. PT is signing off. Thank you for this referral.     Follow Up Recommendations Outpatient PT;Other (comment)(The potential need for Outpatient PT can be addressed at MD follow-up appointments. )    Equipment Recommendations  None recommended by PT    Recommendations for Other Services       Precautions / Restrictions Precautions Precautions: Cervical Precaution Comments: Alexis Fox is very familiar with cervical precautions, cervical brace fit. Required Braces or Orthoses: Cervical Brace Cervical Brace: Hard collar;At all times      Mobility  Bed Mobility               General bed mobility comments: Pt reported no difficulty  Transfers Overall transfer level: Needs assistance Equipment used: None Transfers: Sit to/from Stand Sit to Stand: Supervision         General transfer comment: Slow moving, but no difficulty  Ambulation/Gait Ambulation/Gait assistance: Supervision Gait Distance (Feet): 300 Feet Assistive device: None Gait Pattern/deviations: Decreased step length - right;Decreased step length - left     General Gait  Details: Overall trunk stiffness, but able to walk household distance; cues to self-monitor for activity tolerance  Stairs            Wheelchair Mobility    Modified Rankin (Stroke Patients Only)       Balance Overall balance assessment: Mild deficits observed, not formally tested                                           Pertinent Vitals/Pain Pain Assessment: Faces Faces Pain Scale: Hurts a little bit Pain Location: neck Pain Descriptors / Indicators: Grimacing(Tense) Pain Intervention(s): Monitored during session;Premedicated before session    Home Living Family/patient expects to be discharged to:: Private residence Living Arrangements: Spouse/significant other Available Help at Discharge: Family;Available PRN/intermittently(near 24 hours) Type of Home: House Home Access: Level entry     Home Layout: One level Home Equipment: None      Prior Function Level of Independence: Independent         Comments: retired Education officer, environmental Dominance   Dominant Hand: Right    Extremity/Trunk Assessment   Upper Extremity Assessment Upper Extremity Assessment: Overall WFL for tasks assessed(for simple tasks within cervical prec)    Lower Extremity Assessment Lower Extremity Assessment: Overall WFL for tasks assessed(for simple mobility and amb)    Cervical / Trunk Assessment Cervical / Trunk Assessment: Other exceptions Cervical / Trunk Exceptions: Hard collar at all times  Communication   Communication: No difficulties  Cognition Arousal/Alertness: Awake/alert Behavior During Therapy: [facility] for  tasks assessed/performed Overall Cognitive Status: Within Functional Limits for tasks assessed                                        General Comments General comments (skin integrity, edema, etc.): We discussed positioning and support to be able to relax her neck; Alexis Fox has difficulty reaxing her neck and shoulders while  standing and walking; she is able to relax when her head and neck are supported in reclined or laying down position (with collar on, of course)    Exercises     Assessment/Plan    PT Assessment All further PT needs can be met in the next venue of care  PT Problem List Decreased activity tolerance       PT Treatment Interventions      PT Goals (Current goals can be found in the Care Plan section)  Acute Rehab PT Goals Patient Stated Goal: wants her pain managed better PT Goal Formulation: All assessment and education complete, DC therapy    Frequency     Barriers to discharge        Co-evaluation               AM-PAC PT "6 Clicks" Mobility  Outcome Measure Help needed turning from your back to your side while in a flat bed without using bedrails?: None Help needed moving from lying on your back to sitting on the side of a flat bed without using bedrails?: A Little Help needed moving to and from a bed to a chair (including a wheelchair)?: None Help needed standing up from a chair using your arms (e.g., wheelchair or bedside chair)?: None Help needed to walk in hospital room?: None Help needed climbing 3-5 steps with a railing? : A Little 6 Click Score: 22    End of Session   Activity Tolerance: Patient tolerated treatment well Patient left: in chair;with call bell/phone within reach;with family/visitor present;Other (comment)(making lunch plans) Nurse Communication: Mobility status PT Visit Diagnosis: Pain;Difficulty in walking, not elsewhere classified (R26.2) Pain - part of body: (Neck)    Time: 1140-1153 PT Time Calculation (min) (ACUTE ONLY): 13 min   Charges:   PT Evaluation $PT Eval Low Complexity: [place], PT  Acute Rehabilitation Services Pager [phone] Office [phone]   Colletta [place] 10/26/2018, 12:50 PM

## 2018-10-26 NOTE — Progress Notes (Signed)
Progress Note    Alexis Fox  YNW:[id number] DOB: 1956-08-27  DOA: 10/24/2018 PCP: [name], MD    Brief Narrative:     Medical records reviewed and are as summarized below:  Alexis Fox is an 62 y.o. female with history h/o depression, GERD, left breast cancer status post chemotherapy/surgery/radiation who is pretty functional at baseline and was a former Therapist, sports at [place] presents with complaints of refractory severe neck pain since a fall 2 days back.  On Wednesday, patient was on her driveway using a 6-step ladder to cover her RV when she lost balance and landed on gravel.  She states she immediately knew she injured her neck.  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  She was brought to the ED and work-up revealed C1-C2 vertebral fracture with displaced fragments and grade 2 blunt vascular injury to vertebral artery (described as moderate stenosis either due to external compression by bony fragments or intramural hematoma).  CT head did not reveal any intracranial injury but reported superior scalp hematoma to the right of midline.  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  Assessment/Plan:   Active Problems:   Fall   Pain managed using patient-controlled analgesia (PCA)  Recent mechanical fall, C1-C2 vertebral fracture:  -no neurologic complaints (numbness/tingling etc) -Neurosurgery consult appreciated, so far plan for conservative management, continue with c-collar -On morphine PCA 10, total of 18 mg IV morphine requirement yesterday, so she will be transitioned to OxyContin 15 mg twice daily, and PRN morphine 1 to 2 mg IV every 4. -Started on good bowel regimen as no bowel movement for last 5 days  Vertebral artery compression stenosis, scalp hematoma:  -monitor  Restless leg syndrome/depression:    -Resume home medications  Breast cancer:  -status post surgery radiation and chemotherapy.  OSA -being worked up outpatient for CPAP  Family Communication/Anticipated D/C date and plan/Code Status   DVT prophylaxis: SCD Code Status: Full Code.  Family Communication: Daughter  at bedside Disposition Plan: PT consult pending   Medical Consultants:    NS  Subjective:   New tingling numbness or weakness, no bowel movement for last 5 days but denies any focal deficits, reports neck pain is better controlled, she rates it 5-6 out of 10   Objective:    Vitals:   10/25/18 2358 10/26/18 0409 10/26/18 0546 10/26/18 0811  BP:   (!) 110/55   Pulse:   (!) 59   Resp: 12 (!) 9 12 12   Temp:   98.4 F (36.9 C)   TempSrc:      SpO2: 100% 98% 99% 100%  Weight:      Height:        Intake/Output Summary (Last 24 hours) at 10/26/2018 1239 Last data filed at 10/25/2018 1700 Gross per 24 hour  Intake 240 ml  Output -  Net 240 ml   Filed Weights   10/24/18 0845  Weight: 87.1 kg    Exam:  Awake Alert, Oriented X 3, in a recliner, appears comfortable, in c-collar  Symmetrical Chest wall movement, Good air movement bilaterally, CTAB RRR,No Gallops,Rubs or new Murmurs, No Parasternal Heave +ve B.Sounds, Abd Soft, No tenderness, No rebound - guarding or rigidity. No Cyanosis, Clubbing or edema, No new Rash or bruise    Data Reviewed:   I have personally reviewed following labs and  imaging studies:  Labs: Labs show the following:   Basic Metabolic Panel: Recent Labs  Lab 10/22/18 2135 10/22/18 2206 10/24/18 0800 10/25/18 1528  NA 137 138 134* 139  K 3.9 3.8 6.4* 3.8  CL 103 105 102 103  CO2 24  --  26 31  GLUCOSE 75 68* 95 95  BUN 16 19 12 10   CREATININE 0.97 1.00 0.78 0.92  CALCIUM 9.5  --  8.7* 8.7*   GFR Estimated Creatinine Clearance: 66.9 mL/min (by C-G formula based on SCr of 0.92 mg/dL). Liver Function Tests: No results for input(s): AST, ALT,  ALKPHOS, BILITOT, PROT, ALBUMIN in the last 168 hours. No results for input(s): LIPASE, AMYLASE in the last 168 hours. No results for input(s): AMMONIA in the last 168 hours. Coagulation profile No results for input(s): INR, PROTIME in the last 168 hours.  CBC: Recent Labs  Lab 10/22/18 2135 10/22/18 2206 10/24/18 0800 10/25/18 0335  WBC 11.0*  --  7.7 6.4  NEUTROABS  --   --  5.6  --   HGB 13.0 14.3 12.2 11.8*  HCT 41.5 42.0 41.0 38.7  MCV 95.4  --  98.6 98.7  PLT 206  --  203 213   Cardiac Enzymes: No results for input(s): CKTOTAL, CKMB, CKMBINDEX, TROPONINI in the last 168 hours. BNP (last 3 results) No results for input(s): PROBNP in the last 8760 hours. CBG: No results for input(s): GLUCAP in the last 168 hours. D-Dimer: No results for input(s): DDIMER in the last 72 hours. Hgb A1c: No results for input(s): HGBA1C in the last 72 hours. Lipid Profile: No results for input(s): CHOL, HDL, LDLCALC, TRIG, CHOLHDL, LDLDIRECT in the last 72 hours. Thyroid function studies: No results for input(s): TSH, T4TOTAL, T3FREE, THYROIDAB in the last 72 hours.  Invalid input(s): FREET3 Anemia work up: No results for input(s): VITAMINB12, FOLATE, FERRITIN, TIBC, IRON, RETICCTPCT in the last 72 hours. Sepsis Labs: Recent Labs  Lab 10/22/18 2135 10/24/18 0800 10/25/18 0335  WBC 11.0* 7.7 6.4    Microbiology No results found for this or any previous visit (from the past 240 hour(s)).  Procedures and diagnostic studies:  Dg Cervical Spine 2 Or 3 Views  Result Date: 10/25/2018 CLINICAL DATA:  Follow-up C2 fracture. EXAM: CERVICAL SPINE - 2-3 VIEW COMPARISON:  10/22/2018 CTs FINDINGS: C1 and C2 fractures have a similar appearance to 10/22/2018 CT. 2.5 mm anterolisthesis of C2 on C3 is unchanged from recent CT but the C2-3 joint space appears slightly widened. No new fracture or subluxation identified. Mild to moderate degenerative disc disease/spondylosis in the mid-LOWER cervical  spine noted. IMPRESSION: No significant change of C1 and C2 fractures with 2.5 mm anterolisthesis of C2 on C3. Apparent slight widening of the C2-3 disc space when compared to 10/22/2018 CT. Electronically Signed   By: Margarette Canada M.D.   On: 10/25/2018 15:35    Medications:   . buPROPion  150 mg Oral Daily  . citalopram  40 mg Oral QHS  . clonazePAM  1 mg Oral QHS  . ferrous sulfate  325 mg Oral QHS  . methocarbamol  500 mg Oral TID  . oxyCODONE  15 mg Oral Q12H  . pantoprazole  40 mg Oral Daily  . polyethylene glycol  17 g Oral BID  . pramipexole  0.5 mg Oral QHS  . senna-docusate  3 tablet Oral BID   Continuous Infusions:   LOS: 1 day   Phillips Climes MD [facility]   *Please refer to  [email or web address], password [email or web address] to get updated schedule on who will round on this patient, as hospitalists switch teams weekly. If 7PM-7AM, please contact night-coverage at [email or web address], password [email or web address] for any overnight needs.  10/26/2018, 12:39 PM

## 2018-10-27 MED ORDER — METHOCARBAMOL 500 MG PO TABS: 500.0000 mg | ORAL_TABLET | Freq: Three times a day (TID) | ORAL | 0 refills | Status: DC | PRN

## 2018-10-27 MED ORDER — SENNOSIDES-DOCUSATE SODIUM 8.6-50 MG PO TABS: 3.0000 | ORAL_TABLET | Freq: Two times a day (BID) | ORAL | Status: DC

## 2018-10-27 MED ORDER — OXYCODONE HCL ER 15 MG PO T12A: 15.0000 mg | EXTENDED_RELEASE_TABLET | Freq: Two times a day (BID) | ORAL | 0 refills | Status: DC

## 2018-10-27 MED ORDER — ACETAMINOPHEN 325 MG PO TABS: 650.0000 mg | ORAL_TABLET | Freq: Four times a day (QID) | ORAL | Status: DC | PRN

## 2018-10-27 MED ORDER — OXYCODONE HCL 5 MG PO TABS
5.0000 mg | ORAL_TABLET | ORAL | Status: DC | PRN
  Administered 2018-10-27: 5 mg via ORAL
  Filled 2018-10-27: qty 1

## 2018-10-27 MED ORDER — PANTOPRAZOLE SODIUM 40 MG PO TBEC: 40.0000 mg | DELAYED_RELEASE_TABLET | Freq: Every day | ORAL | 0 refills | Status: DC

## 2018-10-27 MED ORDER — OXYCODONE HCL 5 MG PO TABS: 5.0000 mg | ORAL_TABLET | ORAL | 0 refills | Status: DC | PRN

## 2018-10-27 MED ORDER — POLYETHYLENE GLYCOL 3350 17 G PO PACK: 17.0000 g | PACK | Freq: Every day | ORAL | 0 refills | Status: DC | PRN

## 2018-10-27 NOTE — Progress Notes (Signed)
Patient ID: Alexis Fox, female   DOB: 09/30/1956, 62 y.o.   MRN: [id number] Subjective: Patient reports some pain,, but much better. Still some dizziness  Objective: Vital signs in last 24 hours: Temp:  [97.8 F (36.6 C)-98.9 F (37.2 C)] 98.1 F (36.7 C) (11/25 0420) Pulse Rate:  [56-62] 62 (11/25 0420) Resp:  [17-18] 18 (11/24 2009) BP: (96-114)/(57-66) 104/61 (11/25 0512) SpO2:  [95 %-99 %] 95 % (11/25 0512) Weight:  [87.1 kg] 87.1 kg (11/24 2231)  Intake/Output from previous day: 11/24 0701 - 11/25 0700 In: 360 [P.O.:360] Out: -  Intake/Output this shift: No intake/output data recorded.  Neurologic: Grossly normal  Lab Results: Lab Results  Component Value Date   WBC 6.4 10/25/2018   HGB 11.8 (L) 10/25/2018   HCT 38.7 10/25/2018   MCV 98.7 10/25/2018   PLT 213 10/25/2018   No results found for: INR, PROTIME BMET Lab Results  Component Value Date   NA 139 10/25/2018   K 3.8 10/25/2018   CL 103 10/25/2018   CO2 31 10/25/2018   GLUCOSE 95 10/25/2018   BUN 10 10/25/2018   CREATININE 0.92 10/25/2018   CALCIUM 8.7 (L) 10/25/2018    Studies/Results: Dg Cervical Spine 2 Or 3 Views  Result Date: 10/25/2018 CLINICAL DATA:  Follow-up C2 fracture. EXAM: CERVICAL SPINE - 2-3 VIEW COMPARISON:  10/22/2018 CTs FINDINGS: C1 and C2 fractures have a similar appearance to 10/22/2018 CT. 2.5 mm anterolisthesis of C2 on C3 is unchanged from recent CT but the C2-3 joint space appears slightly widened. No new fracture or subluxation identified. Mild to moderate degenerative disc disease/spondylosis in the mid-LOWER cervical spine noted. IMPRESSION: No significant change of C1 and C2 fractures with 2.5 mm anterolisthesis of C2 on C3. Apparent slight widening of the C2-3 disc space when compared to 10/22/2018 CT. Electronically Signed   By: Margarette Canada M.D.   On: 10/25/2018 15:35   Mr Cervical Spine Wo Contrast  Result Date: 10/26/2018 CLINICAL DATA:  Fall from ladder with  cervical spine fracture EXAM: MRI CERVICAL SPINE WITHOUT CONTRAST TECHNIQUE: Multiplanar, multisequence MR imaging of the cervical spine was performed. No intravenous contrast was administered. COMPARISON:  CT cervical spine 10/22/2018 FINDINGS: Alignment: Grade 1 anterolisthesis at C2-C3 is unchanged. The base the on dental interval measures 5 mm (within normal limits). The atlantodental interval measures less than 2 mm, normal. Vertebrae: Known fractures of C1 and C2 are better characterized on the recent CT. There is interspinous ligament edema at the C1-2 level. There is interruption of the anterior longitudinal ligament at the C2-3 level. The posterior longitudinal ligament is intact. Ligamentum flavum is intact. The tectorial membrane is intact. Cord: Normal caliber and signal. Posterior Fossa, vertebral arteries, paraspinal tissues: There is a small prevertebral effusion at the C2-T1 levels. Disc levels: Sagittal imaging includes the atlantoaxial joint to the level of the T3-4 disc space, with axial imaging of the disc spaces from C2-3 to C7-T1. Axial images are degraded by motion. C2-3: There is narrowing of the left ventral aspect of the thecal sac due to the posterior C2 body fracture. No spinal canal stenosis. Neural foramina are patent. C3-4: No disc herniation or stenosis. C4-5: Intermediate central disc protrusion with no spinal canal stenosis. No neural foraminal stenosis. C5-6: Intermediate disc osteophyte complex with mild spinal canal stenosis. No neural foraminal stenosis. C6-7: Intermediate sized disc osteophyte complex. Mild spinal canal stenosis and mild right foraminal stenosis. C7-T1: Normal. T1-2: Normal. T2-3: Normal. T3-4: Normal. IMPRESSION: 1. Known  fractures of C1 and C2, as characterized on recent CT. This is associated with tear of the anterior longitudinal ligament at the C2-3 level and interspinous ligament edema at C1-2. 2. No spinal cord edema or hemorrhage. 3. Small prevertebral  effusion extending the length of the cervical spine. 4. Multilevel lower cervical degenerative disc disease with mild spinal canal stenosis at C5-6 and C6-7. Electronically Signed   By: Ulyses Jarred M.D.   On: 10/26/2018 23:32    Assessment/Plan: Ok to d/c homes, f/u 2 wks, remain in collar, asa per day tho my neurovascular guy suggests I isnt absolutely necessary  Estimated body mass index is 32.96 kg/m as calculated from the following:   Height as of this encounter: 5\' 4"  (1.626 m).   Weight as of this encounter: 87.1 kg.    LOS: 2 days    Benton Tooker S 10/27/2018, 1:19 PM

## 2018-10-27 NOTE — Discharge Summary (Signed)
Alexis Fox, is a 62 y.o. female  DOB 06/08/1956  MRN [id number].  Admission date:  10/24/2018  Admitting Physician  Guilford Shi, MD  Discharge Date:  10/27/2018   Primary MD  [name], MD  Recommendations for primary care physician for things to follow:  -Follow with neurosurgery as an outpatient in 2 weeks   Admission Diagnosis  Intractable pain [R52] Injury of left vertebral artery, initial encounter [S15.102A] Injury of vertebral artery, right, initial encounter [S15.101A] Failure of outpatient treatment [Z78.9] Other closed displaced fracture of second cervical vertebra, initial encounter [facility]) [S12.190A] Closed nondisplaced fracture of posterior arch of first cervical vertebra, initial encounter [facility]) [S12.031A]   Discharge Diagnosis  Intractable pain [R52] Injury of left vertebral artery, initial encounter [S15.102A] Injury of vertebral artery, right, initial encounter [S15.101A] Failure of outpatient treatment [Z78.9] Other closed displaced fracture of second cervical vertebra, initial encounter ([place]) [S12.190A] Closed nondisplaced fracture of posterior arch of first cervical vertebra, initial encounter ([place]) [T34.287G]    Active Problems:   Fall   Pain managed using patient-controlled analgesia (PCA)      Past Medical History:  Diagnosis Date  . Anxiety   . Arthritis    knees  . Breast cancer ([place])   . Breast cancer of upper-outer quadrant of left female breast ([place]) 03/20/2016  . Cholecystitis   . Complication of anesthesia    BP "crashes" post op  . Depression   . Family history of brain cancer   . Family history of breast cancer   . Hot flashes   . Personal history of radiation therapy   . Restless leg syndrome   . Sleep apnea 10/03/2018    Past Surgical History:  Procedure Laterality Date  . BREAST BIOPSY    . BREAST LUMPECTOMY    . BUNIONECTOMY    .  CHOLECYSTECTOMY N/A 10/04/2015   Procedure: LAPAROSCOPIC CHOLECYSTECTOMY WITH INTRAOPERATIVE CHOLANGIOGRAM;  Surgeon: Greer Pickerel, MD;  Location: [place];  Service: General;  Laterality: N/A;  . ELBOW SURGERY     right for epicondylitis 2010   . FOOT SURGERY     1983 -tarsal tunnel release  . IR GENERIC HISTORICAL  10/26/2016   IR CV LINE INJECTION 10/26/2016 [facility]-INTERV RAD  . LUMBAR DISC SURGERY  04/04/2012  . PORT-A-CATH REMOVAL N/A 11/15/2016   Procedure: REMOVAL PORT-A-CATH;  Surgeon: Rolm Bookbinder, MD;  Location: [place];  Service: General;  Laterality: N/A;  . PORTACATH PLACEMENT Right 04/02/2016   Procedure: INSERTION PORT-A-CATH WITH [place];  Surgeon: Rolm Bookbinder, MD;  Location: [place];  Service: General;  Laterality: Right;  . RADIOACTIVE SEED GUIDED PARTIAL MASTECTOMY/AXILLARY SENTINEL NODE BIOPSY/AXILLARY NODE DISSECTION Left 09/12/2016   Procedure: LEFT BREAST SEED GUIDED LUMPECTOMY, LEFT AXILLARY SENTINEL NODE BIOPSY, LEFT SEED GUIDED AXILLARY NODE EXCISION( TARGETED AXILLARY DISSECTION), BLUE DYE INJECTION;  Surgeon: Rolm Bookbinder, MD;  Location: [place];  Service: General;  Laterality: Left;  . SPINAL FUSION  5/12   L5-S1  . TARSAL TUNNEL RELEASE  History of present illness and  Hospital Course:     Kindly see H&P for history of present illness and admission details, please review complete Labs, Consult reports and Test reports for all details in brief  HPI  from the history and physical done on the day of admission 10/24/2018 HPI: Alexis Fox is a 62 y.o. female with history h/o depression, GERD, left breast cancer status post chemotherapy/surgery/radiation who is pretty functional at baseline and was a former Therapist, sports at [employer] presents with complaints of refractory severe neck pain since a fall 2 days back.  On Wednesday, patient was on her driveway using a 6-step ladder to cover her RV when she lost  balance and landed on gravel.  She states she immediately knew she injured her neck.  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  She was brought to the ED and work-up revealed C1-C2 vertebral fracture with displaced fragments and grade 2 blunt vascular injury to vertebral artery (described as moderate stenosis either due to external compression by bony fragments or intramural hematoma).  CT head did not reveal any intracranial injury but reported superior scalp hematoma to the right of midline.  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  She states her pain level was initially manageable but this morning she was in intractable pain which brought her back to the ED. ED physician discussed with neurosurgery Dr. Vertell Limber who recommended admission for pain control with IV medications given severe neck injury. Patient denies any symptoms of dizziness or chest pain or palpitations before or after the fall.  She denies fever, chills, dysuria, nausea, vomiting or headache.  She currently rates her pain at 6/10 after 4 mg of IV morphine.  She is alert oriented x3 and is able to provide detailed history.  Husband who is a Airline pilot by occupation is at bedside.    Hospital Course   Recent mechanical fall, C1-C2 vertebral fracture: -Sustained fall 2011 22,019, discharged home, but given intractable pain she came back to ED, she was admitted for pain control, initially on morphine PCA, she has been transitioned to oral regimen, she will be discharged on OxyContin 15 milligrams twice daily, oxycodone 5 mg every 4 hours, she was instructed to continue with good bowel regimen . -Neurosurgery input greatly appreciated, she had repeat MRI cervical spine, and show any significant changes from her recent CTA neck . -To follow with neurosurgery as an outpatient in 2  weeks . -CTA neck with contrast it appears right vertebral artery blunt vascular injury Denver grade 1 just below the C1 foramen transversarium. No dissection or significant stenosis.  And left vertebral artery moderate stenosis at C2 foramen transversarium without dissection. Findings may be due to external mass effect from displaced fractures or Denver grade 2 blunt vascular injury due to intramural, I have discussed with neurosurgery Dr. Ronnald Ramp, recommendation for aspirin 325 mg oral daily. -Patient had some headache and dizziness, felt to be secondary to postconcussion, improving  Restless leg syndrome/depression:  -Resume home medications  Breast cancer:  -status post surgery radiation and chemotherapy.  OSA -being worked up outpatient for CPAP     Discharge Condition:  stable   Follow UP  Follow-up Information    [name], MD Follow up in 1 week(s).   Specialty:  Internal Medicine Contact information: [address], [address]  [place]        Eustace Moore, MD Follow up in 2 week(s).   Specialty:  Neurosurgery Contact information: [address] [address] [place] [address] [phone]             Discharge Instructions  and  Discharge Medications     Discharge Instructions    Discharge instructions   Complete by:  As directed    Follow with Primary MD [name], MD in 7 days   Get CBC, CMP, checked  by Primary MD next visit.    Activity: As tolerated with Full fall precautions use walker/cane & assistance as needed   Disposition Home    Diet: Regular diet , with feeding assistance and aspiration precautions.  For Heart failure patients - Check your Weight same time everyday, if you gain over 2 pounds, or you develop in leg swelling, experience more shortness of breath or chest pain, call your Primary MD immediately. Follow Cardiac Low Salt Diet and 1.5 lit/day fluid restriction.   On  your next visit with your primary care physician please Get Medicines reviewed and adjusted.   Please request your Prim.MD to go over all Hospital Tests and Procedure/Radiological results at the follow up, please get all Hospital records sent to your Prim MD by signing hospital release before you go home.   If you experience worsening of your admission symptoms, develop shortness of breath, life threatening emergency, suicidal or homicidal thoughts you must seek medical attention immediately by calling 911 or calling your MD immediately  if symptoms less severe.  You Must read complete instructions/literature along with all the possible adverse reactions/side effects for all the Medicines you take and that have been prescribed to you. Take any new Medicines after you have completely understood and accpet all the possible adverse reactions/side effects.   Do not drive, operating heavy machinery, perform activities at heights, swimming or participation in water activities or provide baby sitting services if your were admitted for syncope or siezures until you have seen by Primary MD or a Neurologist and advised to do so again.  Do not drive when taking Pain medications.    Do not take more than prescribed Pain, Sleep and Anxiety Medications  Special Instructions: If you have smoked or chewed Tobacco  in the last 2 yrs please stop smoking, stop any regular Alcohol  and or any Recreational drug use.  Wear Seat belts while driving.   Please note  You were cared for by a hospitalist during your hospital stay. If you have any questions about your discharge medications or the care you received while you were in the hospital after you are discharged, you can call the unit and asked to speak with the hospitalist on call if the hospitalist that took care of you is not available. Once you are discharged, your primary care physician will handle any further medical issues. Please note that NO REFILLS for  any discharge medications will be authorized once you are discharged, as it is imperative that you return to your primary care physician (or establish a relationship with a primary care physician if you do not have one) for your aftercare needs so that they can reassess your need for medications and monitor your lab values.   Increase activity slowly   Complete by:  As directed      Allergies as of 10/27/2018      Reactions   Turmeric Other (See Comments)   Severe diarrhea  Decadron [dexamethasone]    Exacerbates restless leg      Medication List    STOP taking these medications   esomeprazole 20 MG capsule Commonly known as:  [place] by:  pantoprazole 40 MG tablet   HYDROcodone-acetaminophen 5-325 MG tablet Commonly known as:  NORCO/VICODIN   NONFORMULARY OR COMPOUNDED ITEM   nystatin-triamcinolone ointment Commonly known as:  MYCOLOG   oxyCODONE-acetaminophen 5-325 MG tablet Commonly known as:  PERCOCET/ROXICET     TAKE these medications   acetaminophen 325 MG tablet Commonly known as:  TYLENOL Take 2 tablets (650 mg total) by mouth every 6 (six) hours as needed for mild pain (or Fever >/= 101).   aspirin EC 325 MG tablet Take 1 tablet (325 mg total) by mouth daily.   buPROPion 150 MG 24 hr tablet Commonly known as:  WELLBUTRIN XL Take 150 mg by mouth daily.   citalopram 40 MG tablet Commonly known as:  CELEXA Take 40 mg by mouth daily.   clonazePAM 1 MG tablet Commonly known as:  KLONOPIN Take 1 mg by mouth at bedtime. Dr Laurann Montana   docusate sodium 100 MG capsule Commonly known as:  COLACE Take 1 capsule (100 mg total) by mouth every 12 (twelve) hours.   ferrous sulfate 325 (65 FE) MG EC tablet Take 325 mg by mouth at bedtime.   methocarbamol 500 MG tablet Commonly known as:  ROBAXIN Take 1 tablet (500 mg total) by mouth every 8 (eight) hours as needed for muscle spasms.   oxyCODONE 5 MG immediate release tablet Commonly known as:  Oxy  IR/ROXICODONE Take 1 tablet (5 mg total) by mouth every 4 (four) hours as needed for severe pain.   oxyCODONE 15 mg 12 hr tablet Commonly known as:  OXYCONTIN Take 1 tablet (15 mg total) by mouth every 12 (twelve) hours.   pantoprazole 40 MG tablet Commonly known as:  PROTONIX Take 1 tablet (40 mg total) by mouth daily. Start taking on:  10/28/2018 Replaces:  esomeprazole 20 MG capsule   polyethylene glycol packet Commonly known as:  MIRALAX / GLYCOLAX Take 17 g by mouth daily as needed for mild constipation.   pramipexole 0.5 MG tablet Commonly known as:  MIRAPEX Take 1/2 tab at 4 pm and 1 tab at night time prn. What changed:    how much to take  how to take this  when to take this  additional instructions   senna-docusate 8.6-50 MG tablet Commonly known as:  Senokot-S Take 3 tablets by mouth 2 (two) times daily.   VITAMIN D3 PO Take 1,000 mg by mouth every Monday, Wednesday, and Friday.   zolpidem 5 MG tablet Commonly known as:  AMBIEN Take 5 mg by mouth at bedtime as needed for sleep.         Diet and Activity recommendation: See Discharge Instructions above   Consults obtained - Neurosurgery Dr Ronnald Ramp   Major procedures and Radiology Reports - PLEASE review detailed and final reports for all details, in brief -    Dg Cervical Spine 2 Or 3 Views  Result Date: 10/25/2018 CLINICAL DATA:  Follow-up C2 fracture. EXAM: CERVICAL SPINE - 2-3 VIEW COMPARISON:  10/22/2018 CTs FINDINGS: C1 and C2 fractures have a similar appearance to 10/22/2018 CT. 2.5 mm anterolisthesis of C2 on C3 is unchanged from recent CT but the C2-3 joint space appears slightly widened. No new fracture or subluxation identified. Mild to moderate degenerative disc disease/spondylosis in the mid-LOWER cervical spine noted. IMPRESSION: No significant change  of C1 and C2 fractures with 2.5 mm anterolisthesis of C2 on C3. Apparent slight widening of the C2-3 disc space when compared to 10/22/2018  CT. Electronically Signed   By: Margarette Canada M.D.   On: 10/25/2018 15:35   Ct Head Wo Contrast  Result Date: 10/22/2018 CLINICAL DATA:  Pain after fall. EXAM: CT HEAD WITHOUT CONTRAST CT CERVICAL SPINE WITHOUT CONTRAST TECHNIQUE: Multidetector CT imaging of the head and cervical spine was performed following the standard protocol without intravenous contrast. Multiplanar CT image reconstructions of the cervical spine were also generated. COMPARISON:  None. FINDINGS: CT HEAD FINDINGS Brain: No evidence of acute infarction, hemorrhage, hydrocephalus, extra-axial collection or mass lesion/mass effect. Vascular: No hyperdense vessel or unexpected calcification. Skull: Normal. Negative for fracture or focal lesion. Sinuses/Orbits: No acute finding. Other: Hematoma over the superior scalp to the right of midline. CT CERVICAL SPINE FINDINGS Alignment: Slight anterolisthesis of C2 versus C3 of approximately 2 mm. Reversal of normal lordosis centered at C5. Minimal anterolisthesis of T1 versus T2 and T2 versus T3, 1 or 2 mm. No fractures identified at these levels. No other significant malalignment. Skull base and vertebrae: There are fractures through the bilateral lamina/posterior arch of C1 without displacement. C1 articulates normally with the occipital condyles and the anterior arch is intact. There is a complex fracture involving C2. On the right, there is a comminuted fracture through the inferior body of C2 with extension into the pedicle an vertebral foramen. The fracture extends slightly into the superior facet. There is a displaced fracture through the left posterior C2 vertebral body with probable slight extension into the pedicle and marked involvement of the left vertebral foramen. There appears to be a displaced fracture fragment in the region of her the vertebral foramen. Soft tissues and spinal canal: There is blood posterior to C2. No other soft tissue abnormalities identified. Disc levels:  Multilevel  degenerative changes. Upper chest: Negative. Other: No other abnormalities. IMPRESSION: 1. Nondisplaced fractures through the posterior arch/bilateral lamina of C1. 2. Complicated fracture of C2 involving a comminuted fracture of the right posterior body extending into the pedicle and vertebral foramen. Slight extension into the superior right facet. There is also a displaced fracture through the left C2 vertebral body with slight extension into the pedicle and significant involvement of the left vertebral foramen. There appears to be a displaced bone fragment in the region of the left vertebral foramen. Recommend a CTA for evaluation of the vertebral arteries. 3. Slight anterolisthesis of C2 versus C3. Given the fracture at C2, it would be difficult to exclude ligamentous injury. 4. Minimal anterolisthesis of T1 versus T2 and T2 versus T3, favored to be due to facet degenerative changes at these levels. 5. There is blood posterior to C2 consistent with the fracture in this location. 6. Degenerative changes. 7. No acute intracranial abnormalities. Findings called to Dr. Venora Maples. Electronically Signed   By: Dorise Bullion III M.D   On: 10/22/2018 21:29   Ct Angio Neck W And/or Wo Contrast  Result Date: 10/22/2018 CLINICAL DATA:  61 y/o F; C-spine fracture. History of breast cancer. EXAM: CT ANGIOGRAPHY NECK TECHNIQUE: Multidetector CT imaging of the neck was performed using the standard protocol during bolus administration of intravenous contrast. Multiplanar CT image reconstructions and MIPs were obtained to evaluate the vascular anatomy. Carotid stenosis measurements (when applicable) are obtained utilizing NASCET criteria, using the distal internal carotid diameter as the denominator. CONTRAST:  75 cc Isovue 370 COMPARISON:  10/22/2018 CT head and  cervical spine. FINDINGS: Aortic arch: Standard branching. Imaged portion shows no evidence of aneurysm or dissection. No significant stenosis of the major arch  vessel origins. Right carotid system: No evidence of dissection, stenosis (50% or greater) or occlusion. Left carotid system: No evidence of dissection, stenosis (50% or greater) or occlusion. Vertebral arteries: Mild lumen irregularity (series 10, image 131) without minimal stenosis and without dissection of the right vertebral artery just below the C1 foramen transversarium compatible with blunt vascular injury Denver grade 1. Moderate stenosis of the left vertebral artery traversing the left C2 foramen transversarium (series 11, image 101) with approximately 50% stenosis and no dissection flap. Findings may be due to external mass effect from the surrounding fracture displaced bony structures or intramural hematoma compatible with Denver grade 2 blunt vascular injury. Skeleton: Stable acute fractures through the bilateral lamina/posterior arch of C1 and stable complex comminuted fracture through the body of C2 with extension into pedicles and vertebral foramen. Mild spondylosis with multilevel disc and facet degenerative changes greatest at the C5-C7 levels. Other neck: Negative. Upper chest: Negative. IMPRESSION: 1. Right vertebral artery blunt vascular injury Denver grade 1 just below the C1 foramen transversarium. No dissection or significant stenosis. 2. Left vertebral artery moderate stenosis at C2 foramen transversarium without dissection. Findings may be due to external mass effect from displaced fractures or Denver grade 2 blunt vascular injury due to intramural hematoma. 3. Widely patent carotid systems. 4. Stable acute fractures of C1 and C2 as described on prior study. These results were called by telephone at the time of interpretation on 10/22/2018 at 11:25 pm to Dr. Jola Schmidt , who verbally acknowledged these results. Electronically Signed   By: Kristine Garbe M.D.   On: 10/22/2018 23:28   Ct Cervical Spine Wo Contrast  Result Date: 10/22/2018 CLINICAL DATA:  Pain after fall. EXAM:  CT HEAD WITHOUT CONTRAST CT CERVICAL SPINE WITHOUT CONTRAST TECHNIQUE: Multidetector CT imaging of the head and cervical spine was performed following the standard protocol without intravenous contrast. Multiplanar CT image reconstructions of the cervical spine were also generated. COMPARISON:  None. FINDINGS: CT HEAD FINDINGS Brain: No evidence of acute infarction, hemorrhage, hydrocephalus, extra-axial collection or mass lesion/mass effect. Vascular: No hyperdense vessel or unexpected calcification. Skull: Normal. Negative for fracture or focal lesion. Sinuses/Orbits: No acute finding. Other: Hematoma over the superior scalp to the right of midline. CT CERVICAL SPINE FINDINGS Alignment: Slight anterolisthesis of C2 versus C3 of approximately 2 mm. Reversal of normal lordosis centered at C5. Minimal anterolisthesis of T1 versus T2 and T2 versus T3, 1 or 2 mm. No fractures identified at these levels. No other significant malalignment. Skull base and vertebrae: There are fractures through the bilateral lamina/posterior arch of C1 without displacement. C1 articulates normally with the occipital condyles and the anterior arch is intact. There is a complex fracture involving C2. On the right, there is a comminuted fracture through the inferior body of C2 with extension into the pedicle an vertebral foramen. The fracture extends slightly into the superior facet. There is a displaced fracture through the left posterior C2 vertebral body with probable slight extension into the pedicle and marked involvement of the left vertebral foramen. There appears to be a displaced fracture fragment in the region of her the vertebral foramen. Soft tissues and spinal canal: There is blood posterior to C2. No other soft tissue abnormalities identified. Disc levels:  Multilevel degenerative changes. Upper chest: Negative. Other: No other abnormalities. IMPRESSION: 1. Nondisplaced fractures through the posterior  arch/bilateral lamina of  C1. 2. Complicated fracture of C2 involving a comminuted fracture of the right posterior body extending into the pedicle and vertebral foramen. Slight extension into the superior right facet. There is also a displaced fracture through the left C2 vertebral body with slight extension into the pedicle and significant involvement of the left vertebral foramen. There appears to be a displaced bone fragment in the region of the left vertebral foramen. Recommend a CTA for evaluation of the vertebral arteries. 3. Slight anterolisthesis of C2 versus C3. Given the fracture at C2, it would be difficult to exclude ligamentous injury. 4. Minimal anterolisthesis of T1 versus T2 and T2 versus T3, favored to be due to facet degenerative changes at these levels. 5. There is blood posterior to C2 consistent with the fracture in this location. 6. Degenerative changes. 7. No acute intracranial abnormalities. Findings called to Dr. Venora Maples. Electronically Signed   By: Dorise Bullion III M.D   On: 10/22/2018 21:29   Mr Cervical Spine Wo Contrast  Result Date: 10/26/2018 CLINICAL DATA:  Fall from ladder with cervical spine fracture EXAM: MRI CERVICAL SPINE WITHOUT CONTRAST TECHNIQUE: Multiplanar, multisequence MR imaging of the cervical spine was performed. No intravenous contrast was administered. COMPARISON:  CT cervical spine 10/22/2018 FINDINGS: Alignment: Grade 1 anterolisthesis at C2-C3 is unchanged. The base the on dental interval measures 5 mm (within normal limits). The atlantodental interval measures less than 2 mm, normal. Vertebrae: Known fractures of C1 and C2 are better characterized on the recent CT. There is interspinous ligament edema at the C1-2 level. There is interruption of the anterior longitudinal ligament at the C2-3 level. The posterior longitudinal ligament is intact. Ligamentum flavum is intact. The tectorial membrane is intact. Cord: Normal caliber and signal. Posterior Fossa, vertebral arteries,  paraspinal tissues: There is a small prevertebral effusion at the C2-T1 levels. Disc levels: Sagittal imaging includes the atlantoaxial joint to the level of the T3-4 disc space, with axial imaging of the disc spaces from C2-3 to C7-T1. Axial images are degraded by motion. C2-3: There is narrowing of the left ventral aspect of the thecal sac due to the posterior C2 body fracture. No spinal canal stenosis. Neural foramina are patent. C3-4: No disc herniation or stenosis. C4-5: Intermediate central disc protrusion with no spinal canal stenosis. No neural foraminal stenosis. C5-6: Intermediate disc osteophyte complex with mild spinal canal stenosis. No neural foraminal stenosis. C6-7: Intermediate sized disc osteophyte complex. Mild spinal canal stenosis and mild right foraminal stenosis. C7-T1: Normal. T1-2: Normal. T2-3: Normal. T3-4: Normal. IMPRESSION: 1. Known fractures of C1 and C2, as characterized on recent CT. This is associated with tear of the anterior longitudinal ligament at the C2-3 level and interspinous ligament edema at C1-2. 2. No spinal cord edema or hemorrhage. 3. Small prevertebral effusion extending the length of the cervical spine. 4. Multilevel lower cervical degenerative disc disease with mild spinal canal stenosis at C5-6 and C6-7. Electronically Signed   By: Ulyses Jarred M.D.   On: 10/26/2018 23:32   Dg Chest Portable 1 View  Result Date: 10/22/2018 CLINICAL DATA:  Fall from ladder.  History of breast cancer. EXAM: PORTABLE CHEST 1 VIEW COMPARISON:  Chest radiograph Apr 02, 2016 FINDINGS: Cardiomediastinal silhouette is normal. No pleural effusions or focal consolidations. LEFT lung base calcified granuloma. Calcified mediastinal lymph nodes. Trachea projects midline and there is no pneumothorax. Soft tissue planes and included osseous structures are non-suspicious. LEFT breast surgical clips. Interval removal of RIGHT Port-A-Cath without retained  fragments. IMPRESSION: 1. No acute  cardiopulmonary process. 2. Old granulomatous disease. 3. Interval removal of RIGHT chest Port-A-Cath. Electronically Signed   By: Elon Alas M.D.   On: 10/22/2018 20:59    Micro Results     No results found for this or any previous visit (from the past 240 hour(s)).     Today   Subjective:   Ulyses Jarred today has no headache,no chest abdominal pain,no new weakness tingling or numbness, ports headache and dizziness much improved, but still present .  Objective:   Blood pressure 104/61, pulse 62, temperature 98.1 F (36.7 C), temperature source Oral, resp. rate 18, height 5\' 4"  (1.626 m), weight 87.1 kg, SpO2 95 %.   Intake/Output Summary (Last 24 hours) at 10/27/2018 1424 Last data filed at 10/26/2018 1700 Gross per 24 hour  Intake 240 ml  Output -  Net 240 ml    Exam Awake Alert, Oriented x 3, No new F.N deficits, Normal affect, in c-collar Symmetrical Chest wall movement, Good air movement bilaterally, CTAB RRR,No Gallops,Rubs or new Murmurs, No Parasternal Heave +ve B.Sounds, Abd Soft, Non tender, No organomegaly appriciated, No rebound -guarding or rigidity. No Cyanosis, Clubbing or edema, No new Rash or bruise  Data Review   CBC w Diff:  Lab Results  Component Value Date   WBC 6.4 10/25/2018   HGB 11.8 (L) 10/25/2018   HGB 13.0 05/27/2017   HCT 38.7 10/25/2018   HCT 39.4 05/27/2017   PLT 213 10/25/2018   PLT 186 05/27/2017   LYMPHOPCT 16 10/24/2018   LYMPHOPCT 18.4 05/27/2017   MONOPCT 10 10/24/2018   MONOPCT 11.4 05/27/2017   EOSPCT 1 10/24/2018   EOSPCT 4.3 05/27/2017   BASOPCT 0 10/24/2018   BASOPCT 1.0 05/27/2017    CMP:  Lab Results  Component Value Date   NA 139 10/25/2018   NA 139 05/27/2017   K 3.8 10/25/2018   K 4.0 05/27/2017   CL 103 10/25/2018   CO2 31 10/25/2018   CO2 28 05/27/2017   BUN 10 10/25/2018   BUN 17.9 05/27/2017   CREATININE 0.92 10/25/2018   CREATININE 1.0 05/27/2017   PROT 6.9 09/15/2018   PROT 7.4  05/27/2017   ALBUMIN 3.5 01/02/2018   ALBUMIN 3.9 05/27/2017   BILITOT 0.4 01/02/2018   BILITOT 0.85 05/27/2017   ALKPHOS 73 01/02/2018   ALKPHOS 69 05/27/2017   AST 15 01/02/2018   AST 28 05/27/2017   ALT 17 01/02/2018   ALT 52 05/27/2017  .   Total Time in preparing paper work, data evaluation and todays exam - 63 minutes  Phillips Climes M.D on 10/27/2018 at 2:24 PM  [facility]   Office  [phone]

## 2018-10-27 NOTE — Progress Notes (Signed)
Discharge instructions provided to the patient.  The patient and her daughter verbalizes understanding discharge instructions.  Discharged to the home via wheel chair.

## 2018-10-27 NOTE — Discharge Instructions (Signed)
Follow with Primary MD Alexis Orn, MD in 7 days   Get CBC, CMP, 2 view Chest X ray checked  by Primary MD next visit.    Activity: As tolerated with Full fall precautions use walker/cane & assistance as needed   Disposition Home    Diet: Regular diet , with feeding assistance and aspiration precautions.  For Heart failure patients - Check your Weight same time everyday, if you gain over 2 pounds, or you develop in leg swelling, experience more shortness of breath or chest pain, call your Primary MD immediately. Follow Cardiac Low Salt Diet and 1.5 lit/day fluid restriction.   On your next visit with your primary care physician please Get Medicines reviewed and adjusted.   Please request your Prim.MD to go over all Hospital Tests and Procedure/Radiological results at the follow up, please get all Hospital records sent to your Prim MD by signing hospital release before you go home.   If you experience worsening of your admission symptoms, develop shortness of breath, life threatening emergency, suicidal or homicidal thoughts you must seek medical attention immediately by calling 911 or calling your MD immediately  if symptoms less severe.  You Must read complete instructions/literature along with all the possible adverse reactions/side effects for all the Medicines you take and that have been prescribed to you. Take any new Medicines after you have completely understood and accpet all the possible adverse reactions/side effects.   Do not drive, operating heavy machinery, perform activities at heights, swimming or participation in water activities or provide baby sitting services if your were admitted for syncope or siezures until you have seen by Primary MD or a Neurologist and advised to do so again.  Do not drive when taking Pain medications.    Do not take more than prescribed Pain, Sleep and Anxiety Medications  Special Instructions: If you have smoked or chewed Tobacco  in the  last 2 yrs please stop smoking, stop any regular Alcohol  and or any Recreational drug use.  Wear Seat belts while driving.   Please note  You were cared for by a hospitalist during your hospital stay. If you have any questions about your discharge medications or the care you received while you were in the hospital after you are discharged, you can call the unit and asked to speak with the hospitalist on call if the hospitalist that took care of you is not available. Once you are discharged, your primary care physician will handle any further medical issues. Please note that NO REFILLS for any discharge medications will be authorized once you are discharged, as it is imperative that you return to your primary care physician (or establish a relationship with a primary care physician if you do not have one) for your aftercare needs so that they can reassess your need for medications and monitor your lab values.

## 2018-10-27 NOTE — Plan of Care (Signed)
  Problem: Activity: Goal: Risk for activity intolerance will decrease Outcome: Progressing   Problem: Coping: Goal: Level of anxiety will decrease Outcome: Progressing   Problem: Pain Managment: Goal: General experience of comfort will improve Outcome: Progressing   Problem: Safety: Goal: Ability to remain free from injury will improve Outcome: Progressing   Problem: Skin Integrity: Goal: Risk for impaired skin integrity will decrease Outcome: Progressing   

## 2018-10-28 DIAGNOSIS — G4733 Obstructive sleep apnea (adult) (pediatric): Secondary | ICD-10-CM | POA: Insufficient documentation

## 2018-10-28 NOTE — Procedures (Signed)
PATIENT'S NAME:  Marveen, Donlon DOB:      1956/10/17      MR#:    [id number]     DATE OF RECORDING: 10/20/2018 REFERRING M.D.:  [name] MD Study Performed:   Titration to positive airway pressure HISTORY:  Mrs. Bezanson underwent baseline PSG on 09-28-2018 at [place] [facility] and was diagnosed with obstructive sleep apnea at an [facility] 19.2/h and REM AHI was 26.9/h. No clinically significant hypoxemia was noted. She returns for CPAP titration. .The patient endorsed the Epworth Sleepiness Scale at 12/24 points.   The patient's weight 194 pounds with a height of 64 (inches), resulting in a BMI of 33.1 kg/m2. The patient's neck circumference measured 14 inches.  CURRENT MEDICATIONS: Bupropion, Cetirizine, Cholecalciferol, Citalopram, Clonazepam, Esomeprazole, Ferrous Sulfate, Fluticasone, Nystatin-Triamcinolone ointment, Pramipexole and Zolpidem.  PROCEDURE:  This is a multichannel digital polysomnogram utilizing the SomnoStar 11.2 system.  Electrodes and sensors were applied and monitored per AASM Specifications.   EEG, EOG, Chin and Limb EMG, were sampled at 200 Hz.  ECG, Snore and Nasal Pressure, Thermal Airflow, Respiratory Effort, CPAP Flow and Pressure, Oximetry was sampled at 50 Hz. Digital video and audio were recorded.      CPAP was initiated at 5 cmH20 with heated humidity per AASM split night standards and pressure was advanced to 10 cmH20 because of hypopneas, apneas and desaturations.  At a PAP pressure of 10 cmH20, there was a reduction of the AHI to 0.0 with improvement of sleep apnea. There was coughing and several periods of wakefulness noted, lowering the sleep efficiency. Post-study, the patient indicated that sleep was worse than usual and remarked that the Respironics Dreamwear small cradle mask felt uncomfortable.  Lights Out was at 22:54 and Lights On at 05:03. Total recording time (TRT) was 369.5 minutes, with a total sleep time (TST) of 282 minutes. The patient's sleep latency was  40 minutes. REM latency was 195.5 minutes.  The sleep efficiency was 76.3 %.    SLEEP ARCHITECTURE: WASO (Wake after sleep onset) was 46 minutes. There were 14.5 minutes in Stage N1, 127.5 minutes Stage N2, 121 minutes Stage N3 and 19 minutes in Stage REM.  The percentage of Stage N1 was 5.1%, Stage N2 was 45.2%, Stage N3 was 42.9% and Stage R (REM sleep) was 6.7%.   RESPIRATORY ANALYSIS:  There was a total of 3 respiratory events: 0 obstructive apneas, 1 central apneas and 0 mixed apneas with a total of 1 apneas and 2 hypopneas.  The patient also had many respiratory event related arousals (RERAs).     The total APNEA/HYPOPNEA INDEX  (AHI) was 0.6 /hour -0 events occurred in REM sleep and 3 events in NREM. The REM AHI was 0 /hour versus a non-REM AHI of 0.7 /hour.   The patient spent 206 minutes of total sleep time in the supine position and 76 minutes in non-supine. The supine AHI was 0.9/h, versus a non-supine AHI of 0.0.  OXYGEN SATURATION & C02:  The baseline 02 saturation was 97%, with the lowest being 84%. Time spent below 89% saturation equaled 0 minutes.  AROUSALS: The patient had a total of 0 Periodic Limb Movements. The arousals were noted as: 25 were spontaneous, 0 were associated with PLMs, and 1 associated with respiratory events. Audio and video analysis did not show any abnormal or unusual movements, behaviors, phonations or vocalizations.   Snoring was noted. UARS suspected to contribute to arousals. EKG was in keeping with normal sinus rhythm (NSR). Post-study, the  patient indicated that sleep was worse than usual and remarked that the Respironics Dreamwear small cradle mask felt uncomfortable.  DIAGNOSIS 1. Primary Snoring and Obstructive Sleep Apnea, Upper airway rsistance- responding well to CPAP.   2. Upper Airway Resistance Syndrome was controlled at CPAP of10 cm water pressure during supine sleep, and 8 cm water in non-supine.    PLANS/RECOMMENDATIONS: CPAP autotitration  capable device, to be set to 10 cm water pressure with 1 cm EPR. The patient did not like the dream wear mask, and we will try a Bella Swift nasal mask with ear loops for her instead.  1. The patient should avoid sedatives, hypnotics, and alcoholic beverage consumption before bedtime. 2. CPAP therapy compliance is defined as 4 hours or more of nightly use.   A follow up appointment will be scheduled in the Sleep Clinic at [facility].   Please call [phone] with any questions.     I certify that I have reviewed the entire raw data recording prior to the issuance of this report in accordance with the Standards of Accreditation of the American Academy of Sleep Medicine (AASM)    Larey Seat, M.D.  10-28-2018  Diplomat, American Board of Psychiatry and Neurology  Diplomat, [place] of [facility] Medical Director, [facility] [employer]

## 2018-10-28 NOTE — Addendum Note (Signed)
Addended by: Larey Seat on: 10/28/2018 05:28 PM   Modules accepted: Orders

## 2018-10-29 ENCOUNTER — Telehealth: Payer: Self-pay | Admitting: Neurology

## 2018-10-29 NOTE — Telephone Encounter (Signed)
I called pt. I advised pt that Dr. Brett Fairy reviewed their sleep study results and found that pt has sleep apnea but was treated with CPAP at a pressure of 10 cm water pressure. Dr. Brett Fairy recommends that pt starts a CPAP set at the pressure of 10 cm. I reviewed PAP compliance expectations with the pt. Pt is agreeable to starting a CPAP. I advised pt that an order will be sent to a DME, [facility], and [facility] will call the pt within about one week after they file with the pt's insurance. [facility] will show the pt how to use the machine, fit for masks, and troubleshoot the CPAP if needed. A follow up appt was made for insurance purposes with Cecille Rubin, NP on Feb 20,2020 at 12:45 pm. Pt verbalized understanding to arrive 15 minutes early and bring their CPAP. A letter with all of this information in it will be mailed to the pt as a reminder. I verified with the pt that the address we have on file is correct. Pt verbalized understanding of results. Pt had no questions at this time but was encouraged to call back if questions arise. I have sent the order to [facility] and have received confirmation that they have received the order.

## 2018-10-29 NOTE — Telephone Encounter (Signed)
-----   Message from Larey Seat, MD sent at 10/28/2018  5:28 PM EST ----- DIAGNOSIS 1. Primary Snoring and Obstructive Sleep Apnea, Upper airway  rsistance- responding well to CPAP.  2. Upper Airway Resistance Syndrome was controlled at CPAP of10  cm water pressure during supine sleep, and 8 cm water in  non-supine.    PLANS/RECOMMENDATIONS: CPAP autotitration capable device, to be set to 10 cm water  pressure with 1 cm EPR. The patient did not like the dream wear  mask, and we will try a Bella Swift nasal mask with ear loops for  her instead.  1. The patient should avoid sedatives, hypnotics, and alcoholic  beverage consumption before bedtime.  Prolonged sleep latency may have been due to RLS, but sleep was not interrupted by PLMs.  2. CPAP therapy compliance is defined as 4 hours or more of  nightly use.

## 2018-11-04 DIAGNOSIS — S12001D Unspecified nondisplaced fracture of first cervical vertebra, subsequent encounter for fracture with routine healing: Secondary | ICD-10-CM | POA: Diagnosis not present

## 2018-11-04 DIAGNOSIS — Z79899 Other long term (current) drug therapy: Secondary | ICD-10-CM | POA: Diagnosis not present

## 2018-11-05 ENCOUNTER — Telehealth: Payer: Self-pay | Admitting: Neurology

## 2018-11-05 NOTE — Telephone Encounter (Signed)
Patient stated about 2 weeks ago she feel and broke her neck and she will be in a collar for the next 3 months and she doesn't know what to do about the cpap or if she can handle it right now. Please call and advise.

## 2018-11-06 NOTE — Telephone Encounter (Signed)
Called the patient back and she made [facility] aware that she is wearing a C collar and they told her to call us back and make sure. I advised the patient that I spoke with MD and it is ok to use the machine with a C Collar. Pt states that she will call and possibly get the machine even if its just to get it and use it when her C Collar comes off. I advised the patient of CPAP compliance and informed her that if she gets the machine she will be expected to use it for at least 4 hrs at night. Informed her that if she is unable to start it until after the collar comes off just because she doesn't want to discuss her options with [facility] and how insurance process works. Pt verbalized understanding.

## 2018-11-11 ENCOUNTER — Other Ambulatory Visit (INDEPENDENT_AMBULATORY_CARE_PROVIDER_SITE_OTHER): Payer: Self-pay | Admitting: *Deleted

## 2018-11-11 ENCOUNTER — Telehealth (INDEPENDENT_AMBULATORY_CARE_PROVIDER_SITE_OTHER): Payer: Self-pay | Admitting: Orthopaedic Surgery

## 2018-11-11 DIAGNOSIS — S12121A Other nondisplaced dens fracture, initial encounter for closed fracture: Secondary | ICD-10-CM | POA: Diagnosis not present

## 2018-11-11 DIAGNOSIS — M25551 Pain in right hip: Secondary | ICD-10-CM

## 2018-11-11 NOTE — Telephone Encounter (Signed)
Nothing in note regarding right hip injection. Please advise.

## 2018-11-11 NOTE — Progress Notes (Signed)
amb ref °

## 2018-11-11 NOTE — Telephone Encounter (Signed)
Please call pt and check on correct hip-if she wants inj on right having had good response to left hip then OK to schedule

## 2018-11-11 NOTE — Telephone Encounter (Signed)
Per Dr. Durward Fortes, ok to order right hip injection. Referral in. Thank you.

## 2018-11-14 NOTE — Telephone Encounter (Signed)
Pt called stating she recently fractured her neck and will have a c-collar for about 3 months. Stating she will delay starting her CPAP until after healing.

## 2018-11-14 NOTE — Telephone Encounter (Signed)
noted 

## 2018-11-18 ENCOUNTER — Ambulatory Visit (INDEPENDENT_AMBULATORY_CARE_PROVIDER_SITE_OTHER): Payer: Medicare Other | Admitting: Physical Medicine and Rehabilitation

## 2018-11-18 ENCOUNTER — Encounter (INDEPENDENT_AMBULATORY_CARE_PROVIDER_SITE_OTHER): Payer: Self-pay | Admitting: Physical Medicine and Rehabilitation

## 2018-11-18 ENCOUNTER — Ambulatory Visit (INDEPENDENT_AMBULATORY_CARE_PROVIDER_SITE_OTHER): Payer: Self-pay

## 2018-11-18 ENCOUNTER — Encounter

## 2018-11-18 DIAGNOSIS — M25551 Pain in right hip: Secondary | ICD-10-CM | POA: Diagnosis not present

## 2018-11-18 DIAGNOSIS — S12121A Other nondisplaced dens fracture, initial encounter for closed fracture: Secondary | ICD-10-CM | POA: Diagnosis not present

## 2018-11-18 NOTE — Progress Notes (Signed)
   LUBERTA GRABINSKI - 62 y.o. female MRN [id number]  Date of birth: [birth year]  Office Visit Note: Visit Date: 11/18/2018 PCP: [name], MD Referred by: [name], MD  Subjective: No chief complaint on file.  HPI:  KRISTEEN LANTZ is a 62 y.o. female who comes in today For greater trochanteric injection with fluoroscopic guidance. She has had prior left side injections with good relief. Exam consistent with bursitis, no groin pain.  ROS Otherwise per HPI.  Assessment & Plan: Visit Diagnoses:  1. Pain in right hip     Plan: No additional findings.   Meds & Orders: No orders of the defined types were placed in this encounter.   Orders Placed This Encounter  Procedures  . Large Joint Inj  . XR C-ARM NO REPORT    Follow-up: No follow-ups on file.   Procedures: Large Joint Inj: R greater trochanter on 11/18/2018 3:56 PM Indications: pain and diagnostic evaluation Details: 22 G 3.5 in needle, fluoroscopy-guided lateral approach  Arthrogram: No  Medications: 4 mL bupivacaine 0.25 %; 4 mL lidocaine 2 %; 80 mg triamcinolone acetonide 40 MG/ML Outcome: tolerated well, no immediate complications  There was excellent flow of contrast outlined the greater trochanteric bursa without vascular uptake. Procedure, treatment alternatives, risks and benefits explained, specific risks discussed. Consent was given by the patient. Immediately prior to procedure a time out was called to verify the correct patient, procedure, equipment, support staff and site/side marked as required. Patient was prepped and draped in the usual sterile fashion.      No notes on file   Clinical History: No specialty comments available.     Objective:  VS:  HT:    WT:   BMI:     BP:   HR: bpm  TEMP: ( )  RESP:  Physical Exam  Ortho Exam Imaging: No results found.

## 2018-11-18 NOTE — Progress Notes (Signed)
 .  Numeric Pain Rating Scale and Functional Assessment Average Pain 8   In the last MONTH (on 0-10 scale) has pain interfered with the following?  1. General activity like being  able to carry out your everyday physical activities such as walking, climbing stairs, carrying groceries, or moving a chair?  Rating(4)   -Dye Allergies.

## 2018-11-18 NOTE — Patient Instructions (Signed)

## 2018-12-01 ENCOUNTER — Ambulatory Visit (INDEPENDENT_AMBULATORY_CARE_PROVIDER_SITE_OTHER): Payer: BC Managed Care – PPO | Admitting: Physical Medicine and Rehabilitation

## 2018-12-11 NOTE — Progress Notes (Signed)
Letter for disability successfully faxed to [employer] at [phone].

## 2018-12-15 DIAGNOSIS — S12121A Other nondisplaced dens fracture, initial encounter for closed fracture: Secondary | ICD-10-CM | POA: Diagnosis not present

## 2018-12-15 DIAGNOSIS — S12031A Nondisplaced posterior arch fracture of first cervical vertebra, initial encounter for closed fracture: Secondary | ICD-10-CM | POA: Diagnosis not present

## 2018-12-19 ENCOUNTER — Telehealth (INDEPENDENT_AMBULATORY_CARE_PROVIDER_SITE_OTHER): Payer: Self-pay | Admitting: Physical Medicine and Rehabilitation

## 2018-12-19 MED ORDER — LIDOCAINE HCL 2 % IJ SOLN
4.0000 mL | INTRAMUSCULAR | Status: AC | PRN
  Administered 2018-11-18: 4 mL

## 2018-12-19 MED ORDER — TRIAMCINOLONE ACETONIDE 40 MG/ML IJ SUSP
80.0000 mg | INTRAMUSCULAR | Status: AC | PRN
  Administered 2018-11-18: 80 mg via INTRA_ARTICULAR

## 2018-12-19 MED ORDER — BUPIVACAINE HCL 0.25 % IJ SOLN
4.0000 mL | INTRAMUSCULAR | Status: AC | PRN
  Administered 2018-11-18: 4 mL via INTRA_ARTICULAR

## 2018-12-19 NOTE — Telephone Encounter (Signed)
Scheduled for 1/31 at 1000.

## 2018-12-19 NOTE — Telephone Encounter (Signed)
Ok was greater troch injection

## 2018-12-22 ENCOUNTER — Other Ambulatory Visit (HOSPITAL_COMMUNITY): Payer: Self-pay | Admitting: Neurological Surgery

## 2018-12-22 ENCOUNTER — Other Ambulatory Visit: Payer: Self-pay | Admitting: Neurological Surgery

## 2018-12-22 DIAGNOSIS — S12031A Nondisplaced posterior arch fracture of first cervical vertebra, initial encounter for closed fracture: Secondary | ICD-10-CM

## 2018-12-24 ENCOUNTER — Ambulatory Visit (HOSPITAL_COMMUNITY)
Admission: RE | Admit: 2018-12-24 | Discharge: 2018-12-24 | Disposition: A | Discharge status: Home / Self Care | Payer: BC Managed Care – PPO | Source: Ambulatory Visit | Attending: Neurological Surgery | Admitting: Neurological Surgery

## 2018-12-24 ENCOUNTER — Other Ambulatory Visit (HOSPITAL_COMMUNITY)
Admission: RE | Admit: 2018-12-24 | Discharge: 2018-12-24 | Disposition: A | Discharge status: Home / Self Care | Payer: BC Managed Care – PPO | Attending: Neurological Surgery | Admitting: Neurological Surgery

## 2018-12-24 DIAGNOSIS — R42 Dizziness and giddiness: Secondary | ICD-10-CM | POA: Diagnosis not present

## 2018-12-24 DIAGNOSIS — S12031A Nondisplaced posterior arch fracture of first cervical vertebra, initial encounter for closed fracture: Secondary | ICD-10-CM | POA: Insufficient documentation

## 2018-12-24 LAB — CREATININE, SERUM
Creatinine, Ser: 0.89 mg/dL (ref 0.44–1.00)
GFR calc Af Amer: >60 mL/min (ref >60)
GFR calc non Af Amer: >60 mL/min (ref >60)

## 2018-12-24 LAB — BUN: BUN: 16 mg/dL (ref 8–23)

## 2018-12-24 MED ORDER — IOPAMIDOL (ISOVUE-370) INJECTION 76%
INTRAVENOUS | Status: AC
  Filled 2018-12-24: qty 100

## 2018-12-24 MED ORDER — IOPAMIDOL (ISOVUE-370) INJECTION 76%
100.0000 mL | Freq: Once | INTRAVENOUS | Status: AC | PRN
  Administered 2018-12-24: 80 mL via INTRAVENOUS

## 2018-12-24 MED ORDER — SODIUM CHLORIDE (PF) 0.9 % IJ SOLN
INTRAMUSCULAR | Status: AC
  Filled 2018-12-24: qty 50

## 2018-12-29 DIAGNOSIS — Z6831 Body mass index (BMI) 31.0-31.9, adult: Secondary | ICD-10-CM | POA: Diagnosis not present

## 2018-12-29 DIAGNOSIS — S12031A Nondisplaced posterior arch fracture of first cervical vertebra, initial encounter for closed fracture: Secondary | ICD-10-CM | POA: Diagnosis not present

## 2018-12-31 NOTE — Progress Notes (Addendum)
Patient Care Team: [name], MD as PCP - General (Internal Medicine) Delice Bison, Charlestine Massed, NP as Nurse Practitioner (Hematology and Oncology) Nicholas Lose, MD as Consulting Physician (Hematology and Oncology) Tyler Pita, MD as Consulting Physician (Radiation Oncology) Rolm Bookbinder, MD as Consulting Physician (General Surgery)  DIAGNOSIS:    ICD-10-CM   1. Breast cancer of upper-outer quadrant of left female breast ([place]) C50.412     SUMMARY OF ONCOLOGIC HISTORY:   Breast cancer of upper-outer quadrant of left female breast ([place])   03/02/2016 Mammogram    Hypoechoic masses in the upper outer quadrant left breast 2 adjacent irregular masses 2.5-1.4 x 1.2 cm and the other 2.7 x 1.4 x 2.3 cm total span 6.4 cm, enlarged multiple axillary lymph nodes measuring 1.7 cm, T2 N1/N2 stage IIb vs IIIa    03/19/2016 Initial Diagnosis    Left breast biopsy 12:30 position: Invasive ductal carcinoma with DCIS, 1/1 lymph node positive, grade 3, ER 0%, PR 0%, HER-2 negative ratio 1.68, Ki-67 90%    04/05/2016 - 08/16/2016 Neo-Adjuvant Chemotherapy    Dose dense Adriamycin and Cytoxan 4 followed by Abraxane 12; PREVENT trial (atorvastatin versus placebo)    06/18/2016 Procedure    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    09/12/2016 Surgery    Left breast lumpectomy ([place]): IDC, 0.5cm, DCIS 1 cm, fibrosis, margins negative, 1/3 lymph nodes positive for metastases, triple negative, KI67 90%.      10/22/2016 - 12/11/2016 Radiation Therapy    Adjuvant radiation therapy ([place]): 1. The Left breast was treated to 50.4 Gy in 28 fractions at 1.8 Gy per fraction.  2. The Left breast was boosted to 10 Gy in 5 fractions at 2 Gy per fraction.     01/14/2017 - 05/27/2017 Chemotherapy    Xeloda 2000 mg by  mouth twice a day 2 weeks on one week off for 6 months    10/24/2018 - 10/27/2018 Hospital Admission    C1-C2 vertebral fractures after a fall from the ladder: Right vertebral artery blunt vascular injury     CHIEF COMPLIANT: Surveillance of triple negative left breast cancer  INTERVAL HISTORY: Alexis Fox is a 63 y.o. with above-mentioned history of triple negative left breast cancer who completed therapy and is currently under surveillance. On 10/24/18 she fell from a ladder and fractured her C1-C2 vertebrae. She presents to the clinic today with her husband and is wearing a c-collar. She reports it is miserable, and has to wear it for at least 2 more months, but overall is recovering well. She reports soreness in her left breast at the surgical site. She and her husband are hoping to travel to the [facility] later this year when she recovers. She reviewed her medication list with me.   REVIEW OF SYSTEMS:   Constitutional: Denies fevers, chills or abnormal weight loss Eyes: Denies blurriness of vision Ears, nose, mouth, throat, and face: Denies mucositis or sore throat Respiratory: Denies cough, dyspnea or wheezes Cardiovascular: Denies palpitation, chest discomfort Gastrointestinal:  Denies nausea, heartburn or change in bowel habits Skin: Denies abnormal skin rashes Lymphatics: Denies new lymphadenopathy or easy bruising Neurological: Denies numbness, tingling or new weaknesses Behavioral/Psych: Mood is stable, no new changes  Extremities: No lower extremity edema Breast: denies any lumps or nodules in either breasts (+)  soreness in left breast All other systems were reviewed with the patient and are negative.  I have reviewed the past medical history, past surgical history, social history and family history with the patient and they are unchanged from previous note.  ALLERGIES:  is allergic to turmeric and decadron [dexamethasone].  MEDICATIONS:  Current Outpatient  Medications  Medication Sig Dispense Refill  . acetaminophen (TYLENOL) 325 MG tablet Take 2 tablets (650 mg total) by mouth every 6 (six) hours as needed for mild pain (or Fever >/= 101).    Marland Kitchen aspirin EC 325 MG tablet Take 1 tablet (325 mg total) by mouth daily. 30 tablet 0  . buPROPion (WELLBUTRIN XL) 150 MG 24 hr tablet Take 150 mg by mouth daily.  4  . Cholecalciferol (VITAMIN D3 PO) Take 1,000 mg by mouth every Monday, Wednesday, and Friday.     . citalopram (CELEXA) 40 MG tablet Take 40 mg by mouth daily.   3  . clonazePAM (KLONOPIN) 1 MG tablet Take 1 mg by mouth at bedtime. Dr Laurann Montana    . docusate sodium (COLACE) 100 MG capsule Take 1 capsule (100 mg total) by mouth every 12 (twelve) hours. 60 capsule 0  . ferrous sulfate 325 (65 FE) MG EC tablet Take 325 mg by mouth at bedtime.     . methocarbamol (ROBAXIN) 500 MG tablet Take 1 tablet (500 mg total) by mouth every 8 (eight) hours as needed for muscle spasms. 30 tablet 0  . oxyCODONE (OXY IR/ROXICODONE) 5 MG immediate release tablet Take 1 tablet (5 mg total) by mouth every 4 (four) hours as needed for severe pain. 24 tablet 0  . oxyCODONE (OXYCONTIN) 15 mg 12 hr tablet Take 1 tablet (15 mg total) by mouth every 12 (twelve) hours. 14 tablet 0  . pantoprazole (PROTONIX) 40 MG tablet Take 1 tablet (40 mg total) by mouth daily. 30 tablet 0  . polyethylene glycol (MIRALAX / GLYCOLAX) packet Take 17 g by mouth daily as needed for mild constipation. 14 each 0  . pramipexole (MIRAPEX) 0.5 MG tablet Take 1/2 tab at 4 pm and 1 tab at night time prn. (Patient taking differently: Take 0.5 mg by mouth at bedtime. ) 135 tablet 3  . senna-docusate (SENOKOT-S) 8.6-50 MG tablet Take 3 tablets by mouth 2 (two) times daily.    Marland Kitchen zolpidem (AMBIEN) 5 MG tablet Take 5 mg by mouth at bedtime as needed for sleep.  1   No current facility-administered medications for this visit.    Facility-Administered Medications Ordered in Other Visits  Medication Dose  Route Frequency Provider Last Rate Last Dose  . sodium chloride flush (NS) 0.9 % injection 10 mL  10 mL Intracatheter PRN Nicholas Lose, MD   10 mL at 07/19/16 1434    PHYSICAL EXAMINATION: ECOG PERFORMANCE STATUS: 1 - Symptomatic but completely ambulatory  Vitals:   01/01/19 1127  BP: 111/66  Pulse: 74  Resp: 17  Temp: 97.8 F (36.6 C)  SpO2: 100%   Filed Weights   01/01/19 1127  Weight: 183 lb 3.2 oz (83.1 kg)    GENERAL: alert, no distress and comfortable SKIN: skin color, texture, turgor are normal, no rashes or significant lesions EYES: normal, Conjunctiva are pink and non-injected, sclera clear OROPHARYNX: no exudate, no erythema and lips, buccal mucosa, and tongue normal  NECK: supple, thyroid normal size, non-tender, without nodularity LYMPH: no palpable lymphadenopathy in the cervical, axillary or inguinal LUNGS: clear to auscultation and percussion with normal breathing effort  HEART: regular rate & rhythm and no murmurs and no lower extremity edema ABDOMEN: abdomen soft, non-tender and normal bowel sounds MUSCULOSKELETAL: no cyanosis of digits and no clubbing  NEURO: alert & oriented x 3 with fluent speech, no focal motor/sensory deficits EXTREMITIES: No lower extremity edema BREAST: No palpable masses or nodules in either right or left breasts. No palpable axillary supraclavicular or infraclavicular adenopathy no breast tenderness or nipple discharge. (exam performed in the presence of a chaperone)  LABORATORY DATA:  I have reviewed the data as listed CMP Latest Ref Rng & Units 12/24/2018 10/25/2018 10/24/2018  Glucose 70 - 99 mg/dL - 95 95  BUN 8 - 23 mg/dL _0 Creatinine 0.44 - 1.00 mg/dL 0.89 0.92 0.78  Sodium 135 - 145 mmol/L - 139 134(L)  Potassium 3.5 - 5.1 mmol/L - 3.8 6.4(HH)  Chloride 98 - 111 mmol/L - 103 102  CO2 22 - 32 mmol/L - 31 26  Calcium 8.9 - 10.3 mg/dL - 8.7(L) 8.7(L)  Total Protein 6.0 - 8.5 g/dL - - -  Total Bilirubin 0.2 - 1.2  mg/dL - - -  Alkaline Phos 40 - 150 U/L - - -  AST 5 - 34 U/L - - -  ALT 0 - 55 U/L - - -    Lab Results  Component Value Date   WBC 6.4 10/25/2018   HGB 11.8 (L) 10/25/2018   HCT 38.7 10/25/2018   MCV 98.7 10/25/2018   PLT 213 10/25/2018   NEUTROABS 5.6 10/24/2018    ASSESSMENT & PLAN:  Breast cancer of upper-outer quadrant of left female breast ([place]) Mammogram 03/02/2016: Hypoechoic masses in the upper outer quadrant left breast 2 adjacent irregular masses 2.5-1.4 x 1.2 cm and the other 2.7 x 1.4 x 2.3 cm total span 6.4 cm, enlarged multiple axillary lymph nodes measuring 1.7 cm, T2 N1/N2 stage IIb vs IIIa Left breast biopsy 03/19/2016 12:30 position: Invasive ductal carcinoma with DCIS, 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  PREVENT trial: [facility] [id number]: Patient is enrolled in this trial atorvastatin versus placebo No side effects related to the study drug.  Treatment summary:  1. Completed 4 cycles of dose dense Adriamycin and Cytoxan; and a 12 cycles ofAbraxanefrom 04/05/2016 to 08/16/2016 2. left lumpectomy 09/12/2016: IDC 0.5 cm, DCIS 1 cm, margins negative, 1/4 lymph nodes positive, T1aN1 stage II a pathologic stage 3. Adjuvant radiation 10/22/2016 to 12/11/2016 4. Adjuvant Xeloda 1500 mg twice daily 2 weeks on one week off started 01/07/2017, increasedthe dose to 2000g by mouth twice a day from2/26/2018 completed 05/27/2017 Patient refused participation [id number] ------------------------------------------------------------------------------------------------------------------------------------ Surveillance: 1. Breast Exam  01/01/2019: Benign 2. Mammogramleft breast: 01/01/2018: No abnormalities or masses Patient applied for Social Security and  disability. Hospitalization November 2019: C1-C2 vertebral body fractures from a fall from the ladder She is still in a cervical collar and she needs to be in it for another 9 weeks. Apparently she would not need surgery.  She has lots of plans to travel on her artery across [place].  She is waiting for her neck to get better. Return to clinic in 1 year for follow-up    No orders of the defined types were placed in this encounter.  The patient has a good understanding of the overall plan. she agrees with  it. she will call with any problems that may develop before the next visit here.  Nicholas Lose, MD 01/01/2019  Julious Oka Dorshimer am acting as scribe for Dr. Nicholas Lose.  I have reviewed the above documentation for accuracy and completeness, and I agree with the above.  Addendum: This is an addendum to the history part of the note. Patient continues to be fatigued.  She has difficulty walking her dog daily and when she goes out with her husband, she frequently stays in the vehicle due to lack of energy to walk through the grocery store.  She also has cognitive difficulties which limit her ability to function well in social surroundings.

## 2019-01-01 ENCOUNTER — Telehealth: Payer: Self-pay | Admitting: Hematology and Oncology

## 2019-01-01 ENCOUNTER — Inpatient Hospital Stay: Payer: BC Managed Care – PPO | Attending: Hematology and Oncology | Admitting: Hematology and Oncology

## 2019-01-01 DIAGNOSIS — Z7982 Long term (current) use of aspirin: Secondary | ICD-10-CM | POA: Diagnosis not present

## 2019-01-01 DIAGNOSIS — Z923 Personal history of irradiation: Secondary | ICD-10-CM | POA: Insufficient documentation

## 2019-01-01 DIAGNOSIS — Z79899 Other long term (current) drug therapy: Secondary | ICD-10-CM | POA: Diagnosis not present

## 2019-01-01 DIAGNOSIS — Z171 Estrogen receptor negative status [ER-]: Secondary | ICD-10-CM | POA: Insufficient documentation

## 2019-01-01 DIAGNOSIS — Z9221 Personal history of antineoplastic chemotherapy: Secondary | ICD-10-CM | POA: Insufficient documentation

## 2019-01-01 DIAGNOSIS — C50412 Malignant neoplasm of upper-outer quadrant of left female breast: Secondary | ICD-10-CM | POA: Insufficient documentation

## 2019-01-01 NOTE — Telephone Encounter (Signed)
Gave avs and calendar ° °

## 2019-01-01 NOTE — Assessment & Plan Note (Addendum)
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  PREVENT trial: [facility] [id number]: Patient is enrolled in this trial atorvastatin versus placebo No side effects related to the study drug.  Treatment summary:  1. Completed 4 cycles of dose dense Adriamycin and Cytoxan; and a 12 cycles ofAbraxanefrom 04/05/2016 to 08/16/2016 2. left lumpectomy 09/12/2016: IDC 0.5 cm, DCIS 1 cm, margins negative, 1/4 lymph nodes positive, T1aN1 stage II a pathologic stage 3. Adjuvant radiation 10/22/2016 to 12/11/2016 4. Adjuvant Xeloda 1500 mg twice daily 2 weeks on one week off started 01/07/2017, increasedthe dose to 2000g by mouth twice a day from2/26/2018 completed 05/27/2017 Patient refused participation [id number] ------------------------------------------------------------------------------------------------------------------------------------ Surveillance: 1. Breast Exam  01/01/2019: Benign 2. Mammogramleft breast: Scheduled for May 2020  Patient applied for Social Security and disability. Hospitalization November 2019: C1-C2 vertebral body fractures from a fall from the ladder  Return to clinic in 1 year for follow-up

## 2019-01-02 ENCOUNTER — Ambulatory Visit (INDEPENDENT_AMBULATORY_CARE_PROVIDER_SITE_OTHER): Payer: Medicare Other | Admitting: Physical Medicine and Rehabilitation

## 2019-01-02 ENCOUNTER — Ambulatory Visit (INDEPENDENT_AMBULATORY_CARE_PROVIDER_SITE_OTHER): Payer: Self-pay

## 2019-01-02 VITALS — BP 119/67 | HR 69 | Temp 98.0°F | Wt 183.2 lb

## 2019-01-02 DIAGNOSIS — M7062 Trochanteric bursitis, left hip: Secondary | ICD-10-CM

## 2019-01-02 NOTE — Progress Notes (Signed)
Numeric Pain Rating Scale and Functional Assessment Average Pain 2   In the last MONTH (on 0-10 scale) has pain interfered with the following?  1. General activity like being  able to carry out your everyday physical activities such as walking, climbing stairs, carrying groceries, or moving a chair?  Rating(4)   +Driver, -BT, -Dye Allergies.

## 2019-01-02 NOTE — Progress Notes (Signed)
Alexis Fox - 63 y.o. female MRN [id number]  Date of birth: 07-Feb-1956  Office Visit Note: Visit Date: 01/02/2019 PCP: [name], MD Referred by: [name], MD  Subjective: Chief Complaint  Patient presents with  . Left Hip - Pain, Follow-up   HPI: Alexis Fox is a 63 y.o. female who comes in today For repeat left greater trochanteric bursa injection with fluoroscopic guidance.  Patient had prior injection in October and did well up until just recently.  Injection done with fluoroscopic guidance to the body habitus.  Should continue to follow-up with Dr. Joni Fears for orthopedic care.  ROS Otherwise per HPI.  Assessment & Plan: Visit Diagnoses:  1. Greater trochanteric bursitis, left     Plan: No additional findings.   Meds & Orders: No orders of the defined types were placed in this encounter.   Orders Placed This Encounter  Procedures  . Large Joint Inj: L greater trochanter  . XR C-ARM NO REPORT    Follow-up: Return if symptoms worsen or fail to improve.   Procedures: Large Joint Inj: L greater trochanter on 01/02/2019 10:23 AM Indications: pain and diagnostic evaluation Details: 22 G 3.5 in needle, fluoroscopy-guided lateral approach  Arthrogram: No  Medications: 4 mL lidocaine 2 %; 80 mg triamcinolone acetonide 40 MG/ML; 4 mL bupivacaine 0.25 % Outcome: tolerated well, no immediate complications  There was excellent flow of contrast outlined the greater trochanteric bursa without vascular uptake. Procedure, treatment alternatives, risks and benefits explained, specific risks discussed. Consent was given by the patient. Immediately prior to procedure a time out was called to verify the correct patient, procedure, equipment, support staff and site/side marked as required. Patient was prepped and draped in the usual sterile fashion.      No notes on file   Clinical History: No specialty comments available.   She reports that she has never  smoked. She has never used smokeless tobacco. No results for input(s): HGBA1C, LABURIC in the last 8760 hours.  Objective:  VS:  HT:    WT:183 lb 3.2 oz (83.1 kg)  BMI:31.43    BP:119/67  HR:69bpm  TEMP:98 F (36.7 C)(Oral)  RESP:  Physical Exam  Ortho Exam Imaging: No results found.  Past Medical/Family/Surgical/Social History: Medications & Allergies reviewed per EMR, new medications updated. Patient Active Problem List   Diagnosis Date Noted  . OSA (obstructive sleep apnea) 10/28/2018  . Pain managed using patient-controlled analgesia (PCA) 10/25/2018  . Fall 10/24/2018  . DDD (degenerative disc disease), lumbosacral 09/15/2018  . Fatigue due to depression 09/15/2018  . Chemotherapy-induced neuropathy ([place]) 09/15/2018  . Central line complication 75/64/3329  . Genetic testing 05/15/2016  . Family history of breast cancer   . Family history of brain cancer   . Breast cancer of upper-outer quadrant of left female breast ([place]) 03/20/2016  . Transaminitis 10/03/2015  . Symptomatic cholelithiasis 10/03/2015  . RUQ abdominal pain   . Depression 06/24/2013  . RLS (restless legs syndrome) 06/24/2013   Past Medical History:  Diagnosis Date  . Anxiety   . Arthritis    knees  . Breast cancer ([place])   . Breast cancer of upper-outer quadrant of left female breast ([place]) 03/20/2016  . Cholecystitis   . Complication of anesthesia    BP "crashes" post op  . Depression   . Family history of brain cancer   . Family history of breast cancer   . Hot flashes   . Personal history of radiation therapy   .  Restless leg syndrome   . Sleep apnea 10/03/2018   Family History  Problem Relation Age of Onset  . Heart disease Mother   . Pulmonary fibrosis Mother   . Hypertension Mother   . Cancer Father 39       astocytoma  . Hypertension Brother   . Lymphoma Maternal Aunt   . Anesthesia problems Neg Hx    Past Surgical History:  Procedure Laterality Date  . BREAST BIOPSY    .  BREAST LUMPECTOMY    . BUNIONECTOMY    . CHOLECYSTECTOMY N/A 10/04/2015   Procedure: LAPAROSCOPIC CHOLECYSTECTOMY WITH INTRAOPERATIVE CHOLANGIOGRAM;  Surgeon: Greer Pickerel, MD;  Location: [facility];  Service: General;  Laterality: N/A;  . ELBOW SURGERY     right for epicondylitis 2010   . FOOT SURGERY     1983 -tarsal tunnel release  . IR GENERIC HISTORICAL  10/26/2016   IR CV LINE INJECTION 10/26/2016 [facility]-INTERV RAD  . LUMBAR DISC SURGERY  04/04/2012  . PORT-A-CATH REMOVAL N/A 11/15/2016   Procedure: REMOVAL PORT-A-CATH;  Surgeon: Rolm Bookbinder, MD;  Location: [facility];  Service: General;  Laterality: N/A;  . PORTACATH PLACEMENT Right 04/02/2016   Procedure: INSERTION PORT-A-CATH WITH [place];  Surgeon: Rolm Bookbinder, MD;  Location: [facility];  Service: General;  Laterality: Right;  . RADIOACTIVE SEED GUIDED PARTIAL MASTECTOMY/AXILLARY SENTINEL NODE BIOPSY/AXILLARY NODE DISSECTION Left 09/12/2016   Procedure: LEFT BREAST SEED GUIDED LUMPECTOMY, LEFT AXILLARY SENTINEL NODE BIOPSY, LEFT SEED GUIDED AXILLARY NODE EXCISION( TARGETED AXILLARY DISSECTION), BLUE DYE INJECTION;  Surgeon: Rolm Bookbinder, MD;  Location: [facility];  Service: General;  Laterality: Left;  . SPINAL FUSION  5/12   L5-S1  . TARSAL TUNNEL RELEASE     Social History   Occupational History  . Not on file  Tobacco Use  . Smoking status: Never Smoker  . Smokeless tobacco: Never Used  Substance and Sexual Activity  . Alcohol use: Yes    Alcohol/week: 1.0 standard drinks    Types: 1 Cans of beer per week  . Drug use: No  . Sexual activity: Yes    Birth control/protection: Post-menopausal

## 2019-01-03 ENCOUNTER — Encounter: Payer: Self-pay | Admitting: Hematology and Oncology

## 2019-01-12 MED ORDER — TRIAMCINOLONE ACETONIDE 40 MG/ML IJ SUSP
80.0000 mg | INTRAMUSCULAR | Status: AC | PRN
  Administered 2019-01-02: 80 mg via INTRA_ARTICULAR

## 2019-01-12 MED ORDER — LIDOCAINE HCL 2 % IJ SOLN
4.0000 mL | INTRAMUSCULAR | Status: AC | PRN
  Administered 2019-01-02: 4 mL

## 2019-01-12 MED ORDER — BUPIVACAINE HCL 0.25 % IJ SOLN
4.0000 mL | INTRAMUSCULAR | Status: AC | PRN
  Administered 2019-01-02: 4 mL via INTRA_ARTICULAR

## 2019-01-15 ENCOUNTER — Ambulatory Visit: Payer: BC Managed Care – PPO | Admitting: Neurology

## 2019-01-22 ENCOUNTER — Ambulatory Visit: Payer: Self-pay | Admitting: Nurse Practitioner

## 2019-01-27 ENCOUNTER — Other Ambulatory Visit (HOSPITAL_COMMUNITY): Payer: Self-pay | Admitting: Neurological Surgery

## 2019-01-27 ENCOUNTER — Other Ambulatory Visit: Payer: Self-pay | Admitting: Neurological Surgery

## 2019-01-27 DIAGNOSIS — S12121A Other nondisplaced dens fracture, initial encounter for closed fracture: Secondary | ICD-10-CM | POA: Diagnosis not present

## 2019-01-27 DIAGNOSIS — S12031A Nondisplaced posterior arch fracture of first cervical vertebra, initial encounter for closed fracture: Secondary | ICD-10-CM | POA: Diagnosis not present

## 2019-01-27 DIAGNOSIS — Z6831 Body mass index (BMI) 31.0-31.9, adult: Secondary | ICD-10-CM | POA: Diagnosis not present

## 2019-02-05 ENCOUNTER — Encounter (INDEPENDENT_AMBULATORY_CARE_PROVIDER_SITE_OTHER): Payer: Self-pay | Admitting: Orthopaedic Surgery

## 2019-02-05 ENCOUNTER — Encounter (INDEPENDENT_AMBULATORY_CARE_PROVIDER_SITE_OTHER): Payer: Self-pay | Admitting: Physical Medicine and Rehabilitation

## 2019-02-07 ENCOUNTER — Encounter (INDEPENDENT_AMBULATORY_CARE_PROVIDER_SITE_OTHER): Payer: Self-pay | Admitting: Orthopaedic Surgery

## 2019-02-11 ENCOUNTER — Ambulatory Visit (HOSPITAL_COMMUNITY)
Admission: RE | Admit: 2019-02-11 | Discharge: 2019-02-11 | Disposition: A | Discharge status: Home / Self Care | Payer: Medicare Other | Source: Ambulatory Visit | Attending: Neurological Surgery | Admitting: Neurological Surgery

## 2019-02-11 ENCOUNTER — Other Ambulatory Visit: Payer: Self-pay

## 2019-02-11 DIAGNOSIS — S12121A Other nondisplaced dens fracture, initial encounter for closed fracture: Secondary | ICD-10-CM | POA: Insufficient documentation

## 2019-02-11 DIAGNOSIS — S12100D Unspecified displaced fracture of second cervical vertebra, subsequent encounter for fracture with routine healing: Secondary | ICD-10-CM | POA: Diagnosis not present

## 2019-02-12 ENCOUNTER — Telehealth (INDEPENDENT_AMBULATORY_CARE_PROVIDER_SITE_OTHER): Payer: Self-pay | Admitting: *Deleted

## 2019-02-12 DIAGNOSIS — S12121A Other nondisplaced dens fracture, initial encounter for closed fracture: Secondary | ICD-10-CM | POA: Diagnosis not present

## 2019-02-12 NOTE — Telephone Encounter (Signed)
Done

## 2019-02-13 NOTE — Telephone Encounter (Signed)
Repeat bursa injection then f/u with Dr. Durward Fortes or Aaron Edelman see the phone messages, ok to break glas for direct patient care

## 2019-02-13 NOTE — Telephone Encounter (Signed)
Pt is scheduled for Rpt bursa injection 02/27/2019 and states she will call Dr. Durward Fortes office to get scheduled.

## 2019-02-27 ENCOUNTER — Ambulatory Visit (INDEPENDENT_AMBULATORY_CARE_PROVIDER_SITE_OTHER): Payer: Self-pay | Admitting: Physical Medicine and Rehabilitation

## 2019-03-12 DIAGNOSIS — S12121A Other nondisplaced dens fracture, initial encounter for closed fracture: Secondary | ICD-10-CM | POA: Diagnosis not present

## 2019-03-13 ENCOUNTER — Ambulatory Visit (INDEPENDENT_AMBULATORY_CARE_PROVIDER_SITE_OTHER): Payer: BC Managed Care – PPO | Admitting: Orthopaedic Surgery

## 2019-03-16 ENCOUNTER — Ambulatory Visit (INDEPENDENT_AMBULATORY_CARE_PROVIDER_SITE_OTHER): Payer: BC Managed Care – PPO | Admitting: Orthopaedic Surgery

## 2019-03-20 ENCOUNTER — Ambulatory Visit (INDEPENDENT_AMBULATORY_CARE_PROVIDER_SITE_OTHER): Payer: Self-pay | Admitting: Physical Medicine and Rehabilitation

## 2019-03-25 ENCOUNTER — Other Ambulatory Visit: Payer: Self-pay | Admitting: Neurological Surgery

## 2019-03-25 DIAGNOSIS — S12121A Other nondisplaced dens fracture, initial encounter for closed fracture: Secondary | ICD-10-CM

## 2019-03-26 ENCOUNTER — Other Ambulatory Visit: Payer: BC Managed Care – PPO

## 2019-03-26 ENCOUNTER — Other Ambulatory Visit (INDEPENDENT_AMBULATORY_CARE_PROVIDER_SITE_OTHER): Payer: Self-pay | Admitting: Orthopedic Surgery

## 2019-03-26 ENCOUNTER — Telehealth (INDEPENDENT_AMBULATORY_CARE_PROVIDER_SITE_OTHER): Payer: Self-pay | Admitting: *Deleted

## 2019-03-26 MED ORDER — HYDROCODONE-ACETAMINOPHEN 5-325 MG PO TABS: 1.0000 | ORAL_TABLET | Freq: Four times a day (QID) | ORAL | 0 refills | Status: DC | PRN

## 2019-03-26 NOTE — Telephone Encounter (Signed)
Patient in severe pain, cannot get injection due to COVID, patient requesting pain medication. Patient is using ice, rest, Tylenol and IBU which is not helping. [facility] in [place], [address].

## 2019-03-26 NOTE — Telephone Encounter (Signed)
Rx escribed.

## 2019-03-26 NOTE — Telephone Encounter (Signed)
I called patient 

## 2019-03-27 ENCOUNTER — Ambulatory Visit
Admission: RE | Admit: 2019-03-27 | Discharge: 2019-03-27 | Disposition: A | Discharge status: Home / Self Care | Payer: Medicare Other | Source: Ambulatory Visit | Attending: Neurological Surgery | Admitting: Neurological Surgery

## 2019-03-27 ENCOUNTER — Other Ambulatory Visit: Payer: Self-pay

## 2019-03-27 DIAGNOSIS — R55 Syncope and collapse: Secondary | ICD-10-CM | POA: Diagnosis not present

## 2019-03-27 DIAGNOSIS — S12121A Other nondisplaced dens fracture, initial encounter for closed fracture: Secondary | ICD-10-CM

## 2019-03-30 ENCOUNTER — Ambulatory Visit (INDEPENDENT_AMBULATORY_CARE_PROVIDER_SITE_OTHER): Payer: Medicare Other | Admitting: Family Medicine

## 2019-03-30 ENCOUNTER — Other Ambulatory Visit: Payer: Self-pay

## 2019-03-30 ENCOUNTER — Encounter (INDEPENDENT_AMBULATORY_CARE_PROVIDER_SITE_OTHER): Payer: Self-pay | Admitting: Family Medicine

## 2019-03-30 DIAGNOSIS — M25551 Pain in right hip: Secondary | ICD-10-CM | POA: Diagnosis not present

## 2019-03-30 MED ORDER — METHYLPREDNISOLONE ACETATE 40 MG/ML IJ SUSP: 40.0000 mg | Freq: Once | INTRAMUSCULAR | Status: DC

## 2019-03-30 NOTE — Progress Notes (Signed)
Office Visit Note   Patient: Alexis Fox           Date of Birth: 10/31/1956           MRN: [id number] Visit Date: 03/30/2019 Requested by: [name], MD [address] [place], [place] [address] PCP: [name], MD  Subjective: Chief Complaint  Patient presents with  . Right Hip - Pain    US-guided cortisone injection    HPI: Is here with right lateral hip pain.  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  She has had injections in the past which helped significantly at first, but not as much with subsequent injections.  Recently her right hip is been hurting severely on the lateral aspect, but also with radiation down toward the anterior knee and into the shin.  She has a history of lumbar problems as well and is status post fusion.  She fell and broke her neck about 5 or 6 months ago and is recovering from those fractures.  She still not certain whether she might of injured her low back at the same time.  She had severe vitamin D deficiency 1 year ago.  Her most recent level was 38 on 1000 IU daily.              ROS: No fevers or chills, no respiratory symptoms.  All other systems were reviewed and are negative.  Objective: Vital Signs: There were no vitals taken for this visit.  Physical Exam:  General:  Alert and oriented, in no acute distress. Pulm:  Breathing unlabored. Psy:  Normal mood, congruent affect. Skin: No rash on skin. Right hip: No pain with internal hip rotation.  Exquisitely tender on the lateral aspect of the greater trochanter.  Imaging: Musculoskeletal ultrasound used to guide needle placement to the greater trochanter.  There is irregularity at the attachment of the gluteus medius tendon on the greater trochanter.  Assessment & Plan: 1.  Right lateral hip pain due to greater trochanter syndrome cannot completely rule out lumbar source. -Steroid injection today.  Could contemplate MRI lumbar spine if fails to  improve.  Lateral leg raises for strengthening once pain improves.     Procedures: Right greater trochanter injection: After sterile prep with Betadine, injected 8 cc 1% lidocaine without epinephrine and 40 mg of methylprednisolone using ultrasound to guide needle placement indirectly.  She had good pain relief during the immediate anesthetic phase.   PMFS History: Patient Active Problem List   Diagnosis Date Noted  . OSA (obstructive sleep apnea) 10/28/2018  . Pain managed using patient-controlled analgesia (PCA) 10/25/2018  . Fall 10/24/2018  . DDD (degenerative disc disease), lumbosacral 09/15/2018  . Fatigue due to depression 09/15/2018  . Chemotherapy-induced neuropathy ([place]) 09/15/2018  . Central line complication 20/25/4270  . Genetic testing 05/15/2016  . Family history of breast cancer   . Family history of brain cancer   . Breast cancer of upper-outer quadrant of left female breast ([place]) 03/20/2016  . Transaminitis 10/03/2015  . Symptomatic cholelithiasis 10/03/2015  . RUQ abdominal pain   . Depression 06/24/2013  . RLS (restless legs syndrome) 06/24/2013   Past Medical History:  Diagnosis Date  . Anxiety   . Arthritis    knees  . Breast cancer ([place])   . Breast cancer of upper-outer quadrant of left female breast ([facility]) 03/20/2016  . Cholecystitis   . Complication of anesthesia    BP "crashes" post op  . Depression   .  Family history of brain cancer   . Family history of breast cancer   . Hot flashes   . Personal history of radiation therapy   . Restless leg syndrome   . Sleep apnea 10/03/2018    Family History  Problem Relation Age of Onset  . Heart disease Mother   . Pulmonary fibrosis Mother   . Hypertension Mother   . Cancer Father 31       astocytoma  . Hypertension Brother   . Lymphoma Maternal Aunt   . Anesthesia problems Neg Hx     Past Surgical History:  Procedure Laterality Date  . BREAST BIOPSY    . BREAST LUMPECTOMY    . BUNIONECTOMY     . CHOLECYSTECTOMY N/A 10/04/2015   Procedure: LAPAROSCOPIC CHOLECYSTECTOMY WITH INTRAOPERATIVE CHOLANGIOGRAM;  Surgeon: Greer Pickerel, MD;  Location: [facility];  Service: General;  Laterality: N/A;  . ELBOW SURGERY     right for epicondylitis 2010   . FOOT SURGERY     1983 -tarsal tunnel release  . IR GENERIC HISTORICAL  10/26/2016   IR CV LINE INJECTION 10/26/2016 [facility]  . LUMBAR DISC SURGERY  04/04/2012  . PORT-A-CATH REMOVAL N/A 11/15/2016   Procedure: REMOVAL PORT-A-CATH;  Surgeon: Rolm Bookbinder, MD;  Location: [facility];  Service: General;  Laterality: N/A;  . PORTACATH PLACEMENT Right 04/02/2016   Procedure: INSERTION PORT-A-CATH WITH [place];  Surgeon: Rolm Bookbinder, MD;  Location: [facility];  Service: General;  Laterality: Right;  . RADIOACTIVE SEED GUIDED PARTIAL MASTECTOMY/AXILLARY SENTINEL NODE BIOPSY/AXILLARY NODE DISSECTION Left 09/12/2016   Procedure: LEFT BREAST SEED GUIDED LUMPECTOMY, LEFT AXILLARY SENTINEL NODE BIOPSY, LEFT SEED GUIDED AXILLARY NODE EXCISION( TARGETED AXILLARY DISSECTION), BLUE DYE INJECTION;  Surgeon: Rolm Bookbinder, MD;  Location: [facility];  Service: General;  Laterality: Left;  . SPINAL FUSION  5/12   L5-S1  . TARSAL TUNNEL RELEASE     Social History   Occupational History  . Not on file  Tobacco Use  . Smoking status: Never Smoker  . Smokeless tobacco: Never Used  Substance and Sexual Activity  . Alcohol use: Yes    Alcohol/week: 1.0 standard drinks    Types: 1 Cans of beer per week  . Drug use: No  . Sexual activity: Yes    Birth control/protection: Post-menopausal

## 2019-03-30 NOTE — Patient Instructions (Signed)
   Vitamin D3:  Take 5,000 IU every other day.  Vitamin K2:  Take 100 mcg daily  Magnesium:  Take 200-400 mg daily.

## 2019-03-31 DIAGNOSIS — M5416 Radiculopathy, lumbar region: Secondary | ICD-10-CM | POA: Diagnosis not present

## 2019-03-31 DIAGNOSIS — S12121A Other nondisplaced dens fracture, initial encounter for closed fracture: Secondary | ICD-10-CM | POA: Diagnosis not present

## 2019-04-02 DIAGNOSIS — M545 Low back pain: Secondary | ICD-10-CM | POA: Diagnosis not present

## 2019-04-02 DIAGNOSIS — M5416 Radiculopathy, lumbar region: Secondary | ICD-10-CM | POA: Diagnosis not present

## 2019-04-06 ENCOUNTER — Other Ambulatory Visit: Payer: Self-pay

## 2019-04-06 ENCOUNTER — Ambulatory Visit (INDEPENDENT_AMBULATORY_CARE_PROVIDER_SITE_OTHER): Payer: BC Managed Care – PPO | Admitting: Orthopaedic Surgery

## 2019-04-06 ENCOUNTER — Ambulatory Visit
Admission: RE | Admit: 2019-04-06 | Discharge: 2019-04-06 | Disposition: A | Discharge status: Home / Self Care | Payer: Medicare Other | Source: Ambulatory Visit | Attending: Hematology and Oncology | Admitting: Hematology and Oncology

## 2019-04-06 DIAGNOSIS — N6320 Unspecified lump in the left breast, unspecified quadrant: Secondary | ICD-10-CM | POA: Diagnosis not present

## 2019-04-06 DIAGNOSIS — N6321 Unspecified lump in the left breast, upper outer quadrant: Secondary | ICD-10-CM | POA: Diagnosis not present

## 2019-04-09 DIAGNOSIS — M4316 Spondylolisthesis, lumbar region: Secondary | ICD-10-CM | POA: Diagnosis not present

## 2019-04-09 DIAGNOSIS — S12121A Other nondisplaced dens fracture, initial encounter for closed fracture: Secondary | ICD-10-CM | POA: Diagnosis not present

## 2019-04-09 DIAGNOSIS — M5416 Radiculopathy, lumbar region: Secondary | ICD-10-CM | POA: Diagnosis not present

## 2019-04-14 ENCOUNTER — Other Ambulatory Visit: Payer: Self-pay

## 2019-04-14 ENCOUNTER — Encounter: Payer: Self-pay | Admitting: Orthopaedic Surgery

## 2019-04-14 ENCOUNTER — Ambulatory Visit: Payer: Self-pay | Admitting: Physical Medicine and Rehabilitation

## 2019-04-14 ENCOUNTER — Ambulatory Visit (INDEPENDENT_AMBULATORY_CARE_PROVIDER_SITE_OTHER): Payer: Medicare Other | Admitting: Orthopaedic Surgery

## 2019-04-14 VITALS — BP 129/67 | HR 66 | Ht 64.0 in | Wt 180.0 lb

## 2019-04-14 DIAGNOSIS — M25561 Pain in right knee: Secondary | ICD-10-CM

## 2019-04-14 DIAGNOSIS — G8929 Other chronic pain: Secondary | ICD-10-CM | POA: Diagnosis not present

## 2019-04-14 NOTE — Progress Notes (Addendum)
Office Visit Note   Patient: Alexis Fox           Date of Birth: 1956-02-04           MRN: [id number] Visit Date: 04/14/2019              Requested by: [name], MD [address] [place], [place] [address] PCP: [name], MD   Assessment & Plan: Visit Diagnoses:  1. Chronic pain of right knee     Plan: Right knee pain may be a combination of factors with pain referred from her back, possibly her hip and possibly some pathology in the lateral compartment of the right knee.  I do not think she has a meniscal tear.  There is some patellar crepitation but no effusion or instability.  She has seen Dr. Ronnald Ramp at some point in the future will be scheduled for an L4-5 fusion.  She has some evidence of instability and nerve root irritation.  She has had a prior fusion at L5-S1.  No further treatment plan for her knee at this time  Follow-Up Instructions: Return if symptoms worsen or fail to improve.   Orders:  No orders of the defined types were placed in this encounter.  No orders of the defined types were placed in this encounter.     Procedures: No procedures performed   Clinical Data: No additional findings.   Subjective: Chief Complaint  Patient presents with  . Right Knee - Pain  Patient presents today for right knee pain X 6 weeks following a fall. She said that she is not sure how she fell because she passed out. Her pain is located on the lateral side. She has an unstable L4 and is wondering if the knee is related to the back, or vice versa. She is not wanting x-rays today. She said that her pain is not constant, but seems to hurt more at the end of the day. She is taking tylenol and ibuprofen during the day, and vicodin at night if needed. Has seen Dr. Ronnald Ramp in neurosurgery who will consider an L4-5 fusion at some point in the future with evidence of some instability on flexion and extension films.  Has some pain referred to her right lower  extremity as far distally as the right calf that could certainly be referred from her back.  She recently saw Dr. Junius Roads who injected the lateral aspect of her right hip alter ultrasound and is done very well.  She does have some partial tearing of the gluteal muscles and some early arthritis of her hip. Copies of Dr. Ronnald Ramp office notes were referred to the office and which I have reviewed  HPI  Review of Systems  Constitutional: Positive for fatigue.  HENT: Negative for ear pain.   Eyes: Negative for pain.  Respiratory: Negative for shortness of breath.   Cardiovascular: Negative for leg swelling.  Gastrointestinal: Negative for constipation and diarrhea.  Endocrine: Positive for heat intolerance. Negative for cold intolerance.  Genitourinary: Negative for difficulty urinating.  Musculoskeletal: Positive for joint swelling.  Skin: Negative for rash.  Allergic/Immunologic: Negative for food allergies.  Neurological: Positive for weakness.  Hematological: Does not bruise/bleed easily.  Psychiatric/Behavioral: Positive for sleep disturbance.     Objective: Vital Signs: BP 129/67   Pulse 66   Ht 5\' 4"  (1.626 m)   Wt 180 lb (81.6 kg)   BMI 30.90 kg/m   Physical Exam Constitutional:      Appearance:  She is well-developed.  Eyes:     Pupils: Pupils are equal, round, and reactive to light.  Pulmonary:     Effort: Pulmonary effort is normal.  Skin:    General: Skin is warm and dry.  Neurological:     Mental Status: She is alert and oriented to person, place, and time.  Psychiatric:        Behavior: Behavior normal.     Ortho Exam awake alert and oriented x3.  Comfortable sitting and walking.  No right knee effusion.  No use of ambulatory aid.  No significant right knee pain.  No instability.  Some patellar crepitation but no pain with patella compression.  Full extension of flexion over 105 degrees.  Straight leg raise negative.  No pain with internal or external rotation of the  right or left hip .no pain over the lateral aspect of either hip  Specialty Comments:  No specialty comments available.  Imaging: No results found.   PMFS History: Patient Active Problem List   Diagnosis Date Noted  . OSA (obstructive sleep apnea) 10/28/2018  . Pain managed using patient-controlled analgesia (PCA) 10/25/2018  . Fall 10/24/2018  . DDD (degenerative disc disease), lumbosacral 09/15/2018  . Fatigue due to depression 09/15/2018  . Chemotherapy-induced neuropathy ([facility]) 09/15/2018  . Central line complication 95/28/4132  . Genetic testing 05/15/2016  . Family history of breast cancer   . Family history of brain cancer   . Breast cancer of upper-outer quadrant of left female breast ([facility]) 03/20/2016  . Transaminitis 10/03/2015  . Symptomatic cholelithiasis 10/03/2015  . RUQ abdominal pain   . Depression 06/24/2013  . RLS (restless legs syndrome) 06/24/2013   Past Medical History:  Diagnosis Date  . Anxiety   . Arthritis    knees  . Breast cancer ([facility])   . Breast cancer of upper-outer quadrant of left female breast ([facility]) 03/20/2016  . Cholecystitis   . Complication of anesthesia    BP "crashes" post op  . Depression   . Family history of brain cancer   . Family history of breast cancer   . Hot flashes   . Personal history of radiation therapy   . Restless leg syndrome   . Sleep apnea 10/03/2018    Family History  Problem Relation Age of Onset  . Heart disease Mother   . Pulmonary fibrosis Mother   . Hypertension Mother   . Cancer Father 67       astocytoma  . Hypertension Brother   . Lymphoma Maternal Aunt   . Anesthesia problems Neg Hx     Past Surgical History:  Procedure Laterality Date  . BREAST BIOPSY    . BREAST LUMPECTOMY    . BUNIONECTOMY    . CHOLECYSTECTOMY N/A 10/04/2015   Procedure: LAPAROSCOPIC CHOLECYSTECTOMY WITH INTRAOPERATIVE CHOLANGIOGRAM;  Surgeon: Greer Pickerel, MD;  Location: [facility];  Service: General;  Laterality: N/A;  . ELBOW  SURGERY     right for epicondylitis 2010   . FOOT SURGERY     1983 -tarsal tunnel release  . IR GENERIC HISTORICAL  10/26/2016   IR CV LINE INJECTION 10/26/2016 [facility]  . LUMBAR DISC SURGERY  04/04/2012  . PORT-A-CATH REMOVAL N/A 11/15/2016   Procedure: REMOVAL PORT-A-CATH;  Surgeon: Rolm Bookbinder, MD;  Location: [facility];  Service: General;  Laterality: N/A;  . PORTACATH PLACEMENT Right 04/02/2016   Procedure: INSERTION PORT-A-CATH WITH [place];  Surgeon: Rolm Bookbinder, MD;  Location: [facility];  Service:  General;  Laterality: Right;  . RADIOACTIVE SEED GUIDED PARTIAL MASTECTOMY/AXILLARY SENTINEL NODE BIOPSY/AXILLARY NODE DISSECTION Left 09/12/2016   Procedure: LEFT BREAST SEED GUIDED LUMPECTOMY, LEFT AXILLARY SENTINEL NODE BIOPSY, LEFT SEED GUIDED AXILLARY NODE EXCISION( TARGETED AXILLARY DISSECTION), BLUE DYE INJECTION;  Surgeon: Rolm Bookbinder, MD;  Location: [facility];  Service: General;  Laterality: Left;  . SPINAL FUSION  5/12   L5-S1  . TARSAL TUNNEL RELEASE     Social History   Occupational History  . Not on file  Tobacco Use  . Smoking status: Never Smoker  . Smokeless tobacco: Never Used  Substance and Sexual Activity  . Alcohol use: Yes    Alcohol/week: 1.0 standard drinks    Types: 1 Cans of beer per week  . Drug use: No  . Sexual activity: Yes    Birth control/protection: Post-menopausal

## 2019-04-16 DIAGNOSIS — M4316 Spondylolisthesis, lumbar region: Secondary | ICD-10-CM | POA: Diagnosis not present

## 2019-04-20 ENCOUNTER — Other Ambulatory Visit: Payer: Self-pay | Admitting: Neurological Surgery

## 2019-04-22 ENCOUNTER — Other Ambulatory Visit: Payer: Self-pay | Admitting: Student

## 2019-04-22 DIAGNOSIS — S12031A Nondisplaced posterior arch fracture of first cervical vertebra, initial encounter for closed fracture: Secondary | ICD-10-CM

## 2019-04-29 ENCOUNTER — Ambulatory Visit (INDEPENDENT_AMBULATORY_CARE_PROVIDER_SITE_OTHER): Payer: BC Managed Care – PPO | Admitting: Orthopaedic Surgery

## 2019-04-29 ENCOUNTER — Ambulatory Visit
Admission: RE | Admit: 2019-04-29 | Discharge: 2019-04-29 | Disposition: A | Discharge status: Home / Self Care | Payer: Medicare Other | Source: Ambulatory Visit | Attending: Student | Admitting: Student

## 2019-04-29 DIAGNOSIS — S12031A Nondisplaced posterior arch fracture of first cervical vertebra, initial encounter for closed fracture: Secondary | ICD-10-CM

## 2019-04-30 ENCOUNTER — Ambulatory Visit: Payer: Self-pay | Admitting: Orthopaedic Surgery

## 2019-05-14 NOTE — Pre-Procedure Instructions (Signed)
[id number] - [place], [place] - [address] [place] [address] Phone: [phone] Fax: [phone]      Your procedure is scheduled on 05-18-19 Monday from 1325-1702  Report to [facility] Entr[facility] "A" at 1125 A.M., and check in at the Admitting office.  Call this number if you have problems the morning of surgery:  [phone]  Call [phone] if you have any questions prior to your surgery date Monday-Friday 8am-4pm    Remember:  Do not eat or drink after midnight.   Take these medicines the morning of surgery with A SIP OF WATER : buPROPion (WELLBUTRIN XL) esomeprazole (NEXIUM HYDROcodone-acetaminophen (NORCO/VICODIN)as needed cycloSPORINE (RESTASIS)  As of today, STOP taking any Aspirin (unless otherwise instructed by your surgeon), Aleve, Naproxen, Ibuprofen, Motrin, Advil, Goody's, BC's, all herbal medications, fish oil, and all vitamins.    The Morning of Surgery  Do not wear jewelry, make-up or nail polish.  Do not wear lotions, powders, or perfumes/colognes, or deodorant  Do not shave 48 hours prior to surgery.    Do not bring valuables to the hospital.  [facility] is not responsible for any belongings or valuables.  If you are a smoker, DO NOT Smoke 24 hours prior to surgery IF you wear a CPAP at night please bring your mask, tubing, and machine the morning of surgery   Remember that you must have someone to transport you home after your surgery, and remain with you for 24 hours if you are discharged the same day.   Contacts, glasses, hearing aids, dentures or bridgework may not be worn into surgery.    Leave your suitcase in the car.  After surgery it may be brought to your room.  For patients admitted to the hospital, discharge time will be determined by your treatment team.  Patients discharged the day of surgery will not be allowed to drive home.    Special instructions:   [facility]- Preparing For  Surgery  Before surgery, you can play an important role. Because skin is not sterile, your skin needs to be as free of germs as possible. You can reduce the number of germs on your skin by washing with CHG (chlorahexidine gluconate) Soap before surgery.  CHG is an antiseptic cleaner which kills germs and bonds with the skin to continue killing germs even after washing.    Oral Hygiene is also important to reduce your risk of infection.  Remember - BRUSH YOUR TEETH THE MORNING OF SURGERY WITH YOUR REGULAR TOOTHPASTE  Please do not use if you have an allergy to CHG or antibacterial soaps. If your skin becomes reddened/irritated stop using the CHG.  Do not shave (including legs and underarms) for at least 48 hours prior to first CHG shower. It is OK to shave your face.  Please follow these instructions carefully.   1. Shower the NIGHT BEFORE SURGERY and the MORNING OF SURGERY with CHG Soap.   2. If you chose to wash your hair, wash your hair first as usual with your normal shampoo.  3. After you shampoo, rinse your hair and body thoroughly to remove the shampoo.  4. Use CHG as you would any other liquid soap. You can apply CHG directly to the skin and wash gently with a scrungie or a clean washcloth.   5. Apply the CHG Soap to your body ONLY FROM THE NECK DOWN.  Do not use on open wounds or open sores. Avoid contact with your  eyes, ears, mouth and genitals (private parts). Wash Face and genitals (private parts)  with your normal soap.   6. Wash thoroughly, paying special attention to the area where your surgery will be performed.  7. Thoroughly rinse your body with warm water from the neck down.  8. DO NOT shower/wash with your normal soap after using and rinsing off the CHG Soap.  9. Pat yourself dry with a CLEAN TOWEL.  10. Wear CLEAN PAJAMAS to bed the night before surgery, wear comfortable clothes the morning of surgery  11. Place CLEAN SHEETS on your bed the night of your first  shower and DO NOT SLEEP WITH PETS.    Day of Surgery:  Do not apply any deodorants/lotions.  Please wear clean clothes to the hospital/surgery center.   Remember to brush your teeth WITH YOUR REGULAR TOOTHPASTE.   Please read over the following fact sheets that you were given.

## 2019-05-15 ENCOUNTER — Other Ambulatory Visit: Payer: Self-pay

## 2019-05-15 ENCOUNTER — Other Ambulatory Visit (HOSPITAL_COMMUNITY)
Admission: RE | Admit: 2019-05-15 | Discharge: 2019-05-15 | Disposition: A | Discharge status: Home / Self Care | Payer: Medicare Other | Source: Ambulatory Visit | Attending: Neurological Surgery | Admitting: Neurological Surgery

## 2019-05-15 ENCOUNTER — Encounter (HOSPITAL_COMMUNITY)
Admission: RE | Admit: 2019-05-15 | Discharge: 2019-05-15 | Disposition: A | Discharge status: Home / Self Care | Payer: Medicare Other | Source: Ambulatory Visit | Attending: Neurological Surgery | Admitting: Neurological Surgery

## 2019-05-15 ENCOUNTER — Ambulatory Visit (HOSPITAL_COMMUNITY)
Admission: RE | Admit: 2019-05-15 | Discharge: 2019-05-15 | Disposition: A | Discharge status: Home / Self Care | Payer: Medicare Other | Source: Ambulatory Visit | Attending: Neurological Surgery | Admitting: Neurological Surgery

## 2019-05-15 ENCOUNTER — Encounter (HOSPITAL_COMMUNITY): Payer: Self-pay

## 2019-05-15 DIAGNOSIS — M48061 Spinal stenosis, lumbar region without neurogenic claudication: Secondary | ICD-10-CM | POA: Diagnosis not present

## 2019-05-15 DIAGNOSIS — G2581 Restless legs syndrome: Secondary | ICD-10-CM | POA: Insufficient documentation

## 2019-05-15 DIAGNOSIS — Z853 Personal history of malignant neoplasm of breast: Secondary | ICD-10-CM | POA: Insufficient documentation

## 2019-05-15 DIAGNOSIS — Z79899 Other long term (current) drug therapy: Secondary | ICD-10-CM | POA: Insufficient documentation

## 2019-05-15 DIAGNOSIS — Z01818 Encounter for other preprocedural examination: Secondary | ICD-10-CM | POA: Insufficient documentation

## 2019-05-15 DIAGNOSIS — M4316 Spondylolisthesis, lumbar region: Secondary | ICD-10-CM | POA: Diagnosis not present

## 2019-05-15 DIAGNOSIS — Z9049 Acquired absence of other specified parts of digestive tract: Secondary | ICD-10-CM | POA: Insufficient documentation

## 2019-05-15 DIAGNOSIS — M4310 Spondylolisthesis, site unspecified: Secondary | ICD-10-CM | POA: Insufficient documentation

## 2019-05-15 DIAGNOSIS — S12101D Unspecified nondisplaced fracture of second cervical vertebra, subsequent encounter for fracture with routine healing: Secondary | ICD-10-CM | POA: Diagnosis not present

## 2019-05-15 DIAGNOSIS — Z01811 Encounter for preprocedural respiratory examination: Secondary | ICD-10-CM | POA: Diagnosis not present

## 2019-05-15 DIAGNOSIS — F419 Anxiety disorder, unspecified: Secondary | ICD-10-CM | POA: Insufficient documentation

## 2019-05-15 DIAGNOSIS — I4510 Unspecified right bundle-branch block: Secondary | ICD-10-CM | POA: Insufficient documentation

## 2019-05-15 DIAGNOSIS — S12001D Unspecified nondisplaced fracture of first cervical vertebra, subsequent encounter for fracture with routine healing: Secondary | ICD-10-CM | POA: Diagnosis not present

## 2019-05-15 DIAGNOSIS — F329 Major depressive disorder, single episode, unspecified: Secondary | ICD-10-CM | POA: Diagnosis not present

## 2019-05-15 DIAGNOSIS — M4312 Spondylolisthesis, cervical region: Secondary | ICD-10-CM | POA: Diagnosis not present

## 2019-05-15 DIAGNOSIS — I4519 Other right bundle-branch block: Secondary | ICD-10-CM | POA: Diagnosis not present

## 2019-05-15 DIAGNOSIS — Z1159 Encounter for screening for other viral diseases: Secondary | ICD-10-CM | POA: Diagnosis not present

## 2019-05-15 DIAGNOSIS — G4730 Sleep apnea, unspecified: Secondary | ICD-10-CM | POA: Insufficient documentation

## 2019-05-15 DIAGNOSIS — Z808 Family history of malignant neoplasm of other organs or systems: Secondary | ICD-10-CM | POA: Insufficient documentation

## 2019-05-15 DIAGNOSIS — J8410 Pulmonary fibrosis, unspecified: Secondary | ICD-10-CM | POA: Diagnosis not present

## 2019-05-15 DIAGNOSIS — M4327 Fusion of spine, lumbosacral region: Secondary | ICD-10-CM | POA: Insufficient documentation

## 2019-05-15 DIAGNOSIS — K219 Gastro-esophageal reflux disease without esophagitis: Secondary | ICD-10-CM | POA: Diagnosis not present

## 2019-05-15 DIAGNOSIS — G4733 Obstructive sleep apnea (adult) (pediatric): Secondary | ICD-10-CM | POA: Diagnosis not present

## 2019-05-15 HISTORY — DX: Unspecified displaced fracture of second cervical vertebra, initial encounter for closed fracture: S12.100A

## 2019-05-15 LAB — CBC WITH DIFFERENTIAL/PLATELET
Abs Immature Granulocytes: 0.02 10*3/uL (ref 0.00–0.07)
Basophils Absolute: 0 10*3/uL (ref 0.0–0.1)
Basophils Relative: 1 %
Eosinophils Absolute: 0.4 10*3/uL (ref 0.0–0.5)
Eosinophils Relative: 7 %
HCT: 41.8 % (ref 36.0–46.0)
Hemoglobin: 12.8 g/dL (ref 12.0–15.0)
Immature Granulocytes: 0 %
Lymphocytes Relative: 25 %
Lymphs Abs: 1.3 10*3/uL (ref 0.7–4.0)
MCH: 30.5 pg (ref 26.0–34.0)
MCHC: 30.6 g/dL (ref 30.0–36.0)
MCV: 99.5 fL (ref 80.0–100.0)
Monocytes Absolute: 0.5 10*3/uL (ref 0.1–1.0)
Monocytes Relative: 10 %
Neutro Abs: 3 10*3/uL (ref 1.7–7.7)
Neutrophils Relative %: 57 %
Platelets: 251 10*3/uL (ref 150–400)
RBC: 4.2 MIL/uL (ref 3.87–5.11)
RDW: 13 % (ref 11.5–15.5)
WBC: 5.2 10*3/uL (ref 4.0–10.5)
nRBC: 0 % (ref 0.0–0.2)

## 2019-05-15 LAB — BASIC METABOLIC PANEL
Anion gap: 8 (ref 5–15)
BUN: 17 mg/dL (ref 8–23)
CO2: 24 mmol/L (ref 22–32)
Calcium: 9.5 mg/dL (ref 8.9–10.3)
Chloride: 109 mmol/L (ref 98–111)
Creatinine, Ser: 0.71 mg/dL (ref 0.44–1.00)
GFR calc Af Amer: >60 mL/min (ref >60)
GFR calc non Af Amer: >60 mL/min (ref >60)
Glucose, Bld: 94 mg/dL (ref 70–99)
Potassium: 4.2 mmol/L (ref 3.5–5.1)
Sodium: 141 mmol/L (ref 135–145)

## 2019-05-15 LAB — TYPE AND SCREEN
ABO/RH(D): A POS
Antibody Screen: NEGATIVE

## 2019-05-15 LAB — PROTIME-INR
INR: 1 (ref 0.8–1.2)
Prothrombin Time: 13.5 seconds (ref 11.4–15.2)

## 2019-05-15 LAB — SURGICAL PCR SCREEN
MRSA, PCR: NEGATIVE
Staphylococcus aureus: NEGATIVE

## 2019-05-15 NOTE — Anesthesia Preprocedure Evaluation (Addendum)
Anesthesia Evaluation  Patient identified by MRN, date of birth, ID band Patient awake    Reviewed: Allergy & Precautions, NPO status , Patient's Chart, lab work & pertinent test results  Airway Mallampati: II   Neck ROM: Limited    Dental  (+) Teeth Intact, Dental Advisory Given   Pulmonary sleep apnea ,    breath sounds clear to auscultation       Cardiovascular negative cardio ROS   Rhythm:Regular Rate:Normal     Neuro/Psych Anxiety Depression    GI/Hepatic Neg liver ROS, GERD  Medicated,  Endo/Other  negative endocrine ROS  Renal/GU negative Renal ROS     Musculoskeletal  (+) Arthritis ,   Abdominal Normal abdominal exam  (+)   Peds  Hematology negative hematology ROS (+)   Anesthesia Other Findings Stable, healing C1 fracture  Reproductive/Obstetrics                            Lab Results  Component Value Date   WBC 5.2 05/15/2019   HGB 12.8 05/15/2019   HCT 41.8 05/15/2019   MCV 99.5 05/15/2019   PLT 251 05/15/2019   Lab Results  Component Value Date   CREATININE 0.71 05/15/2019   BUN 17 05/15/2019   NA 141 05/15/2019   K 4.2 05/15/2019   CL 109 05/15/2019   CO2 24 05/15/2019   Lab Results  Component Value Date   INR 1.0 05/15/2019    Echo: - Left ventricle: The cavity size was normal. Wall thickness was   normal. Systolic function was normal. The estimated ejection   fraction was in the range of 60% to 65%. Wall motion was normal;   there were no regional wall motion abnormalities. Features are   consistent with a pseudonormal left ventricular filling pattern,   with concomitant abnormal relaxation and increased filling   pressure (grade 2 diastolic dysfunction). - Pulmonary arteries: PA peak pressure: 33 mm Hg (S). - Impressions: Lateral s&' = 10.5 cm/sec. GLS -23.4%  EKG: normal sinus rhythm, incomplete RBBB.    Anesthesia Physical Anesthesia  Plan  ASA: II  Anesthesia Plan: General   Post-op Pain Management:    Induction: Intravenous  PONV Risk Score and Plan: 4 or greater and Ondansetron, Dexamethasone, Midazolam and Scopolamine patch - Pre-op  Airway Management Planned: Oral ETT and Video Laryngoscope Planned  Additional Equipment: None  Intra-op Plan:   Post-operative Plan: Extubation in OR  Informed Consent: I have reviewed the patients History and Physical, chart, labs and discussed the procedure including the risks, benefits and alternatives for the proposed anesthesia with the patient or authorized representative who has indicated his/her understanding and acceptance.     Dental advisory given  Plan Discussed with: CRNA  Anesthesia Plan Comments: (See PAT note written 05/15/2019 by Myra Gianotti, PA-C. History of C1 non-displaced fracture and C2 complex fracture on 10/22/18. C2 fracture "nearly healed" by 04/29/19 CT c-spine. Message left for Dr. Ronnald Ramp to discuss any specific recommendations for intubation with anesthesiologist. )      Anesthesia Quick Evaluation

## 2019-05-15 NOTE — Progress Notes (Signed)
Anesthesia Chart Review:  Case: [id number] Date/Time: 05/18/19 1310   Procedure: PLIF - L4-L5,  removal hardware L5-S1 (N/A Back)   Anesthesia type: General   Pre-op diagnosis: Spondylolisthesis   Location: [facility] OR ROOM 48 / [place] [facility]   Surgeon: Eustace Moore, MD      DISCUSSION: Patient is a 63 year old female scheduled for the above procedure.  History includes never smoker, left breast cancer (s/p chemotherapy 04/05/16-08/16/16 and 01/14/17-05/27/17, s/p left breast lumpectomy, targeted axillary LN dissection 09/12/16, s/p radiation 11/01/16-12/11/16), RLS, OSA (CPAP prescribed 10/2018), L5-S1 TLIF 04/04/12. She reports issues with post-operative hypotension. 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  She fell from a ladder on 10/22/18 and sustained a nondisplaced fractures through the posterior arch/bilateral lamina of C1, complicated fracture of C2 involving a comminuted fracture of the right posterior body extending into the pedicle and vertebral foramen with slight extension into the superior right facet, a displaced fracture through the left C2 vertebral body with slight extension into the pedicle and significant involvement of the left vertebral foramen, a displaced bone fragment in the region of the left vertebral foramen, right vertebral artery blunt vascular injury Denver grade 1 just below the C1 foramen transversarium without dissection or significant stenosis, and moderate stenosis of left vertebral artery at C2 foramen transversarium without dissection (findings may be due to external mass effect form displaced fractures or Denver grade 2 blunt vascular injury due to intramural hematoma). - She was treated with a cervical collar and plans for re-imaging and neurosurgery follow-up. 12/24/18 CTA neck showed healing distal right vertebral artery minimal intimal injury without pseudoaneurysm, dissection flap, or stenosis. 04/29/19 CT c-spine showed nearly healed C2 fracture with unchanged trace  anterolisthesis at C2-C3.  05/15/19 COVID test in in process. If results negative and otherwise no acute changes then I would anticipate that she can proceed as planned. As above, she had a complex C2 fracture 10/22/18 that was "nearly healed" by CT 04/29/19. I spoke with Lorriane Shire at Dr. Ronnald Ramp' office regarding neurosurgery recommendations for intubation. She will ask if he can address with anesthesiologist today or on the day of surgery.   VS: BP 106/62   Pulse 73   Temp 36.5 C   Resp 20   Ht 5\' 4"  (1.626 m)   Wt 85.7 kg   SpO2 98%   BMI 32.42 kg/m    PROVIDERS: [name], MD is PCP - Nicholas Lose, MD is HEM-ONC. Last visit 01/01/19 with one year follow-up recommended. - Dohmeier, Asencion Partridge, MD is neurologist. Sees for restless leg syndrome and OSA. - Bensimhon, Daniel, MD is cardiologist.--but was seeing him at the [facility] while receiving chemotherapy. Last visit seen 05/14/16.    LABS: Labs reviewed: Acceptable for surgery. (all labs ordered are listed, but only abnormal results are displayed)  Labs Reviewed  SURGICAL PCR SCREEN  BASIC METABOLIC PANEL  CBC WITH DIFFERENTIAL/PLATELET  PROTIME-INR  TYPE AND SCREEN    IMAGES: CT C-spine 04/29/19: IMPRESSION: 1. Nearly healed C2 fracture with unchanged trace anterolisthesis at C2-C3.  CTA neck 12/24/18: IMPRESSION: 1. Healing distal RIGHT vertebral artery minimal intimal injury without pseudoaneurysm or dissection flap. No flow-limiting stenosis or acute vascular process. 2. No hemodynamically significant stenosis carotid artery. 3. Nondisplaced C2 fracture, decreased distraction.  1V CXR 10/22/18: IMPRESSION: 1. No acute cardiopulmonary process. 2. Old granulomatous disease. 3. Interval removal of RIGHT chest Port-A-Cath.   EKG: 05/15/19: NSR, incomplete right BBB. 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   CV: Echo 05/14/16: Study Conclusions - Left ventricle: The  cavity size was normal. Wall thickness was   normal. Systolic function was normal. The estimated ejection   fraction was in the range of 60% to 65%. Wall motion was normal;   there were no regional wall motion abnormalities. Features are   consistent with a pseudonormal left ventricular filling pattern,   with concomitant abnormal relaxation and increased filling   pressure (grade 2 diastolic dysfunction). - Pulmonary arteries: PA peak pressure: 33 mm Hg (S). - Impressions: Lateral s&' = 10.5 cm/sec. GLS -23.4%   Past Medical History:  Diagnosis Date  . Anxiety   . Arthritis    knees  . Breast cancer ([place])   . Breast cancer of upper-outer quadrant of left female breast ([place]) 03/20/2016  . C2 cervical fracture ([place])    10/22/18, following fall from ladder  . Cholecystitis   . Complication of anesthesia    BP "crashes" post op  . Depression   . Family history of brain cancer   . Family history of breast cancer   . Hot flashes   . Personal history of radiation therapy   . Restless leg syndrome   . Sleep apnea 10/03/2018    Past Surgical History:  Procedure Laterality Date  . BREAST BIOPSY    . BREAST LUMPECTOMY    . BUNIONECTOMY    . CHOLECYSTECTOMY N/A 10/04/2015   Procedure: LAPAROSCOPIC CHOLECYSTECTOMY WITH INTRAOPERATIVE CHOLANGIOGRAM;  Surgeon: Greer Pickerel, MD;  Location: [place];  Service: General;  Laterality: N/A;  . ELBOW SURGERY     right for epicondylitis 2010   . FOOT SURGERY     1983 -tarsal tunnel release  . IR GENERIC HISTORICAL  10/26/2016   IR CV LINE INJECTION 10/26/2016 [facility]  . LUMBAR DISC SURGERY  04/04/2012  . PORT-A-CATH REMOVAL N/A 11/15/2016   Procedure: REMOVAL PORT-A-CATH;  Surgeon: Rolm Bookbinder, MD;  Location: [place];  Service: General;  Laterality: N/A;  . PORTACATH PLACEMENT Right 04/02/2016   Procedure: INSERTION PORT-A-CATH WITH [place];  Surgeon: Rolm Bookbinder, MD;  Location: [place];  Service:  General;  Laterality: Right;  . RADIOACTIVE SEED GUIDED PARTIAL MASTECTOMY/AXILLARY SENTINEL NODE BIOPSY/AXILLARY NODE DISSECTION Left 09/12/2016   Procedure: LEFT BREAST SEED GUIDED LUMPECTOMY, LEFT AXILLARY SENTINEL NODE BIOPSY, LEFT SEED GUIDED AXILLARY NODE EXCISION( TARGETED AXILLARY DISSECTION), BLUE DYE INJECTION;  Surgeon: Rolm Bookbinder, MD;  Location: [place];  Service: General;  Laterality: Left;  . SPINAL FUSION  5/12   L5-S1  . TARSAL TUNNEL RELEASE      MEDICATIONS: . buPROPion (WELLBUTRIN XL) 150 MG 24 hr tablet  . Cholecalciferol (VITAMIN D3) 50 MCG (2000 UT) TABS  . citalopram (CELEXA) 40 MG tablet  . clonazePAM (KLONOPIN) 1 MG tablet  . cycloSPORINE (RESTASIS) 0.05 % ophthalmic emulsion  . esomeprazole (NEXIUM) 20 MG capsule  . HYDROcodone-acetaminophen (NORCO/VICODIN) 5-325 MG tablet  . pramipexole (MIRAPEX) 0.5 MG tablet  . zolpidem (AMBIEN) 5 MG tablet   . methylPREDNISolone acetate (DEPO-MEDROL) injection 40 mg   . sodium chloride flush (NS) 0.9 % injection 10 mL    Myra Gianotti, PA-C Surgical Short Stay/Anesthesiology [facility] Phone [phone] [facility] Phone [phone] 05/15/2019 1:25 PM

## 2019-05-15 NOTE — Progress Notes (Signed)
  Coronavirus Screening  Pt scheduled for COVID test today. Have you experienced the following symptoms:  Cough yes/no: No Fever (>100.52F)  yes/no: No Runny nose yes/no: No Sore throat yes/no: No Difficulty breathing/shortness of breath  yes/no: No Loss of sense of smell or taste- No Have you or a family member traveled in the last 14 days and where? yes/no: No  PCP - Dr [name]  Cardiologist - Dr.  Haroldine Laws  Oncologist- Dr. Sonny Dandy  Chest x-ray - 05-15-19 Epic  EKG - 05-15-19  Stress Test - denies  ECHO - 05-14-16  Cardiac Cath - denies  AICD-denies PM-denies LOOP-denies  Sleep Study - 10-20-2018 (Epic) CPAP - yes.  LABS-PCR,CBCdiff,BMP,PT-INR,T/S  ASA-denies  ERAS-NA  HA1C-denies Fasting Blood Sugar -  Checks Blood Sugar __0___ times a day  Anesthesia-Y. Hypotension postop.   Pt denies having chest pain, sob, or fever at this time. All instructions explained to the pt, with a verbal understanding of the material. Pt agrees to go over the instructions while at home for a better understanding. The opportunity to ask questions was provided.

## 2019-05-16 LAB — NOVEL CORONAVIRUS, NAA (HOSP ORDER, SEND-OUT TO REF LAB; TAT 18-24 HRS): SARS-CoV-2, NAA: NOT DETECTED

## 2019-05-18 ENCOUNTER — Inpatient Hospital Stay (HOSPITAL_COMMUNITY): Payer: Medicare Other

## 2019-05-18 ENCOUNTER — Inpatient Hospital Stay (HOSPITAL_COMMUNITY)
Admission: RE | Disposition: A | Discharge status: Home / Self Care | Payer: Self-pay | Source: Home / Self Care | Attending: Neurological Surgery

## 2019-05-18 ENCOUNTER — Inpatient Hospital Stay (HOSPITAL_COMMUNITY): Payer: Medicare Other | Admitting: Vascular Surgery

## 2019-05-18 ENCOUNTER — Inpatient Hospital Stay (HOSPITAL_COMMUNITY)
Admission: RE | Admit: 2019-05-18 | Discharge: 2019-05-19 | DRG: 455 | Disposition: A | Discharge status: Home / Self Care | Payer: Medicare Other | Attending: Neurological Surgery | Admitting: Neurological Surgery

## 2019-05-18 ENCOUNTER — Inpatient Hospital Stay (HOSPITAL_COMMUNITY): Payer: Medicare Other | Admitting: Certified Registered"

## 2019-05-18 ENCOUNTER — Encounter (HOSPITAL_COMMUNITY): Payer: Self-pay

## 2019-05-18 ENCOUNTER — Other Ambulatory Visit: Payer: Self-pay

## 2019-05-18 DIAGNOSIS — F329 Major depressive disorder, single episode, unspecified: Secondary | ICD-10-CM | POA: Diagnosis present

## 2019-05-18 DIAGNOSIS — Z419 Encounter for procedure for purposes other than remedying health state, unspecified: Secondary | ICD-10-CM

## 2019-05-18 DIAGNOSIS — M48061 Spinal stenosis, lumbar region without neurogenic claudication: Secondary | ICD-10-CM | POA: Diagnosis not present

## 2019-05-18 DIAGNOSIS — M5137 Other intervertebral disc degeneration, lumbosacral region: Secondary | ICD-10-CM | POA: Diagnosis not present

## 2019-05-18 DIAGNOSIS — Z888 Allergy status to other drugs, medicaments and biological substances status: Secondary | ICD-10-CM

## 2019-05-18 DIAGNOSIS — K219 Gastro-esophageal reflux disease without esophagitis: Secondary | ICD-10-CM | POA: Diagnosis present

## 2019-05-18 DIAGNOSIS — G2581 Restless legs syndrome: Secondary | ICD-10-CM | POA: Diagnosis present

## 2019-05-18 DIAGNOSIS — I4519 Other right bundle-branch block: Secondary | ICD-10-CM | POA: Diagnosis not present

## 2019-05-18 DIAGNOSIS — M549 Dorsalgia, unspecified: Secondary | ICD-10-CM | POA: Diagnosis not present

## 2019-05-18 DIAGNOSIS — Z1159 Encounter for screening for other viral diseases: Secondary | ICD-10-CM | POA: Diagnosis not present

## 2019-05-18 DIAGNOSIS — M4316 Spondylolisthesis, lumbar region: Principal | ICD-10-CM | POA: Diagnosis present

## 2019-05-18 DIAGNOSIS — G4733 Obstructive sleep apnea (adult) (pediatric): Secondary | ICD-10-CM | POA: Diagnosis not present

## 2019-05-18 DIAGNOSIS — F419 Anxiety disorder, unspecified: Secondary | ICD-10-CM | POA: Diagnosis present

## 2019-05-18 DIAGNOSIS — M4312 Spondylolisthesis, cervical region: Secondary | ICD-10-CM | POA: Diagnosis present

## 2019-05-18 DIAGNOSIS — Z981 Arthrodesis status: Secondary | ICD-10-CM | POA: Diagnosis not present

## 2019-05-18 DIAGNOSIS — Z79899 Other long term (current) drug therapy: Secondary | ICD-10-CM | POA: Diagnosis not present

## 2019-05-18 DIAGNOSIS — W11XXXD Fall on and from ladder, subsequent encounter: Secondary | ICD-10-CM | POA: Diagnosis present

## 2019-05-18 DIAGNOSIS — S12001D Unspecified nondisplaced fracture of first cervical vertebra, subsequent encounter for fracture with routine healing: Secondary | ICD-10-CM

## 2019-05-18 DIAGNOSIS — S12101D Unspecified nondisplaced fracture of second cervical vertebra, subsequent encounter for fracture with routine healing: Secondary | ICD-10-CM | POA: Diagnosis not present

## 2019-05-18 DIAGNOSIS — W19XXXA Unspecified fall, initial encounter: Secondary | ICD-10-CM | POA: Diagnosis present

## 2019-05-18 HISTORY — PX: SPINAL FUSION: SHX223

## 2019-05-18 SURGERY — POSTERIOR LUMBAR FUSION 1 WITH HARDWARE REMOVAL
Anesthesia: General | Site: Back

## 2019-05-18 MED ORDER — ACETAMINOPHEN 160 MG/5ML PO SOLN: 325.0000 mg | Freq: Once | ORAL | Status: DC | PRN

## 2019-05-18 MED ORDER — PROMETHAZINE HCL 25 MG/ML IJ SOLN: 6.2500 mg | INTRAMUSCULAR | Status: DC | PRN

## 2019-05-18 MED ORDER — NON FORMULARY
Status: DC | PRN
  Administered 2019-05-18: 5 mL

## 2019-05-18 MED ORDER — SODIUM CHLORIDE 0.9% FLUSH
3.0000 mL | Freq: Two times a day (BID) | INTRAVENOUS | Status: DC
  Administered 2019-05-18: 3 mL via INTRAVENOUS

## 2019-05-18 MED ORDER — POTASSIUM CHLORIDE IN NACL 20-0.9 MEQ/L-% IV SOLN: INTRAVENOUS | Status: DC

## 2019-05-18 MED ORDER — HYDROMORPHONE HCL 1 MG/ML IJ SOLN
INTRAMUSCULAR | Status: AC
  Filled 2019-05-18: qty 1

## 2019-05-18 MED ORDER — THROMBIN 20000 UNITS EX SOLR
CUTANEOUS | Status: DC | PRN
  Administered 2019-05-18: 20 mL via TOPICAL

## 2019-05-18 MED ORDER — SENNA 8.6 MG PO TABS
1.0000 | ORAL_TABLET | Freq: Two times a day (BID) | ORAL | Status: DC
  Administered 2019-05-18 – 2019-05-19 (×2): 8.6 mg via ORAL
  Filled 2019-05-18 (×2): qty 1

## 2019-05-18 MED ORDER — VANCOMYCIN HCL 1000 MG IV SOLR
INTRAVENOUS | Status: DC | PRN
  Administered 2019-05-18: 1000 mg via TOPICAL

## 2019-05-18 MED ORDER — SODIUM CHLORIDE 0.9 % IV SOLN: 250.0000 mL | INTRAVENOUS | Status: DC

## 2019-05-18 MED ORDER — MEPERIDINE HCL 25 MG/ML IJ SOLN: 6.2500 mg | INTRAMUSCULAR | Status: DC | PRN

## 2019-05-18 MED ORDER — ACETAMINOPHEN 650 MG RE SUPP: 650.0000 mg | RECTAL | Status: DC | PRN

## 2019-05-18 MED ORDER — CHLORHEXIDINE GLUCONATE CLOTH 2 % EX PADS: 6.0000 | MEDICATED_PAD | Freq: Once | CUTANEOUS | Status: DC

## 2019-05-18 MED ORDER — PANTOPRAZOLE SODIUM 40 MG PO TBEC
40.0000 mg | DELAYED_RELEASE_TABLET | Freq: Every day | ORAL | Status: DC
  Administered 2019-05-19: 40 mg via ORAL
  Filled 2019-05-18: qty 1

## 2019-05-18 MED ORDER — ACETAMINOPHEN 325 MG PO TABS: 650.0000 mg | ORAL_TABLET | ORAL | Status: DC | PRN

## 2019-05-18 MED ORDER — SCOPOLAMINE 1 MG/3DAYS TD PT72
MEDICATED_PATCH | TRANSDERMAL | Status: DC | PRN
  Administered 2019-05-18: 1 via TRANSDERMAL

## 2019-05-18 MED ORDER — ONDANSETRON HCL 4 MG/2ML IJ SOLN: 4.0000 mg | Freq: Four times a day (QID) | INTRAMUSCULAR | Status: DC | PRN

## 2019-05-18 MED ORDER — METHOCARBAMOL 500 MG PO TABS
500.0000 mg | ORAL_TABLET | Freq: Four times a day (QID) | ORAL | Status: DC | PRN
  Administered 2019-05-18 – 2019-05-19 (×3): 500 mg via ORAL
  Filled 2019-05-18 (×2): qty 1

## 2019-05-18 MED ORDER — SUFENTANIL CITRATE 50 MCG/ML IV SOLN
INTRAVENOUS | Status: DC | PRN
  Administered 2019-05-18: 10 ug via INTRAVENOUS
  Administered 2019-05-18: 15 ug via INTRAVENOUS
  Administered 2019-05-18: 10 ug via INTRAVENOUS

## 2019-05-18 MED ORDER — MORPHINE SULFATE (PF) 2 MG/ML IV SOLN
2.0000 mg | INTRAVENOUS | Status: DC | PRN
  Administered 2019-05-18 – 2019-05-19 (×5): 2 mg via INTRAVENOUS
  Filled 2019-05-18 (×5): qty 1

## 2019-05-18 MED ORDER — PRAMIPEXOLE DIHYDROCHLORIDE 0.25 MG PO TABS
0.5000 mg | ORAL_TABLET | Freq: Every day | ORAL | Status: DC
  Administered 2019-05-18: 0.5 mg via ORAL
  Filled 2019-05-18 (×2): qty 2

## 2019-05-18 MED ORDER — SUGAMMADEX SODIUM 200 MG/2ML IV SOLN
INTRAVENOUS | Status: DC | PRN
  Administered 2019-05-18: 200 mg via INTRAVENOUS

## 2019-05-18 MED ORDER — CLONAZEPAM 1 MG PO TABS
1.0000 mg | ORAL_TABLET | Freq: Every day | ORAL | Status: DC
  Administered 2019-05-18: 1 mg via ORAL
  Filled 2019-05-18: qty 1

## 2019-05-18 MED ORDER — OXYCODONE HCL 5 MG PO TABS
5.0000 mg | ORAL_TABLET | ORAL | Status: DC | PRN
  Administered 2019-05-18 – 2019-05-19 (×5): 5 mg via ORAL
  Filled 2019-05-18 (×4): qty 1

## 2019-05-18 MED ORDER — BUPIVACAINE HCL (PF) 0.25 % IJ SOLN
INTRAMUSCULAR | Status: DC | PRN
  Administered 2019-05-18: 5 mL

## 2019-05-18 MED ORDER — ONDANSETRON HCL 4 MG/2ML IJ SOLN
INTRAMUSCULAR | Status: DC | PRN
  Administered 2019-05-18: 4 mg via INTRAVENOUS

## 2019-05-18 MED ORDER — PHENOL 1.4 % MT LIQD: 1.0000 | OROMUCOSAL | Status: DC | PRN

## 2019-05-18 MED ORDER — CEFAZOLIN SODIUM-DEXTROSE 2-4 GM/100ML-% IV SOLN
2.0000 g | INTRAVENOUS | Status: AC
  Administered 2019-05-18: 2 g via INTRAVENOUS

## 2019-05-18 MED ORDER — MIDAZOLAM HCL 5 MG/5ML IJ SOLN
INTRAMUSCULAR | Status: DC | PRN
  Administered 2019-05-18: 2 mg via INTRAVENOUS

## 2019-05-18 MED ORDER — ACETAMINOPHEN 10 MG/ML IV SOLN: 1000.0000 mg | Freq: Once | INTRAVENOUS | Status: DC | PRN

## 2019-05-18 MED ORDER — LIDOCAINE 2% (20 MG/ML) 5 ML SYRINGE
INTRAMUSCULAR | Status: DC | PRN
  Administered 2019-05-18: 80 mg via INTRAVENOUS

## 2019-05-18 MED ORDER — CEFAZOLIN SODIUM-DEXTROSE 2-4 GM/100ML-% IV SOLN
INTRAVENOUS | Status: AC
  Filled 2019-05-18: qty 100

## 2019-05-18 MED ORDER — ROCURONIUM BROMIDE 10 MG/ML (PF) SYRINGE
PREFILLED_SYRINGE | INTRAVENOUS | Status: DC | PRN
  Administered 2019-05-18: 10 mg via INTRAVENOUS
  Administered 2019-05-18: 50 mg via INTRAVENOUS
  Administered 2019-05-18: 10 mg via INTRAVENOUS
  Administered 2019-05-18: 20 mg via INTRAVENOUS
  Administered 2019-05-18: 10 mg via INTRAVENOUS

## 2019-05-18 MED ORDER — MENTHOL 3 MG MT LOZG: 1.0000 | LOZENGE | OROMUCOSAL | Status: DC | PRN

## 2019-05-18 MED ORDER — THROMBIN 5000 UNITS EX SOLR
OROMUCOSAL | Status: DC | PRN
  Administered 2019-05-18 (×2): 5 mL via TOPICAL

## 2019-05-18 MED ORDER — PROPOFOL 10 MG/ML IV BOLUS
INTRAVENOUS | Status: AC
  Filled 2019-05-18: qty 20

## 2019-05-18 MED ORDER — CELECOXIB 200 MG PO CAPS
200.0000 mg | ORAL_CAPSULE | Freq: Two times a day (BID) | ORAL | Status: DC
  Administered 2019-05-18 – 2019-05-19 (×2): 200 mg via ORAL
  Filled 2019-05-18 (×2): qty 1

## 2019-05-18 MED ORDER — 0.9 % SODIUM CHLORIDE (POUR BTL) OPTIME
TOPICAL | Status: DC | PRN
  Administered 2019-05-18: 1000 mL

## 2019-05-18 MED ORDER — THROMBIN 20000 UNITS EX SOLR
CUTANEOUS | Status: AC
  Filled 2019-05-18: qty 20000

## 2019-05-18 MED ORDER — METHOCARBAMOL 500 MG PO TABS
ORAL_TABLET | ORAL | Status: AC
  Filled 2019-05-18: qty 1

## 2019-05-18 MED ORDER — PROPOFOL 10 MG/ML IV BOLUS
INTRAVENOUS | Status: DC | PRN
  Administered 2019-05-18: 120 mg via INTRAVENOUS

## 2019-05-18 MED ORDER — SODIUM CHLORIDE 0.9% FLUSH: 3.0000 mL | INTRAVENOUS | Status: DC | PRN

## 2019-05-18 MED ORDER — HYDROMORPHONE HCL 1 MG/ML IJ SOLN
0.2500 mg | INTRAMUSCULAR | Status: DC | PRN
  Administered 2019-05-18 (×2): 0.25 mg via INTRAVENOUS

## 2019-05-18 MED ORDER — THROMBIN 5000 UNITS EX SOLR
CUTANEOUS | Status: AC
  Filled 2019-05-18: qty 5000

## 2019-05-18 MED ORDER — SUFENTANIL CITRATE 50 MCG/ML IV SOLN
INTRAVENOUS | Status: AC
  Filled 2019-05-18: qty 1

## 2019-05-18 MED ORDER — ACETAMINOPHEN 325 MG PO TABS: 325.0000 mg | ORAL_TABLET | Freq: Once | ORAL | Status: DC | PRN

## 2019-05-18 MED ORDER — HEPARIN SODIUM (PORCINE) 1000 UNIT/ML IJ SOLN
INTRAMUSCULAR | Status: AC
  Filled 2019-05-18: qty 1

## 2019-05-18 MED ORDER — DEXAMETHASONE SODIUM PHOSPHATE 10 MG/ML IJ SOLN
INTRAMUSCULAR | Status: DC | PRN
  Administered 2019-05-18: 4 mg via INTRAVENOUS

## 2019-05-18 MED ORDER — SODIUM CHLORIDE 0.9 % IV SOLN
INTRAVENOUS | Status: DC | PRN
  Administered 2019-05-18: 500 mL

## 2019-05-18 MED ORDER — BUPIVACAINE HCL (PF) 0.25 % IJ SOLN
INTRAMUSCULAR | Status: AC
  Filled 2019-05-18: qty 30

## 2019-05-18 MED ORDER — LACTATED RINGERS IV SOLN
INTRAVENOUS | Status: DC | PRN
  Administered 2019-05-18 (×2): via INTRAVENOUS

## 2019-05-18 MED ORDER — ROCURONIUM BROMIDE 10 MG/ML (PF) SYRINGE
PREFILLED_SYRINGE | INTRAVENOUS | Status: AC
  Filled 2019-05-18: qty 10

## 2019-05-18 MED ORDER — VANCOMYCIN HCL 1000 MG IV SOLR
INTRAVENOUS | Status: AC
  Filled 2019-05-18: qty 1000

## 2019-05-18 MED ORDER — METHOCARBAMOL 1000 MG/10ML IJ SOLN
500.0000 mg | Freq: Four times a day (QID) | INTRAVENOUS | Status: DC | PRN
  Filled 2019-05-18: qty 5

## 2019-05-18 MED ORDER — OXYCODONE HCL 5 MG PO TABS
ORAL_TABLET | ORAL | Status: AC
  Filled 2019-05-18: qty 1

## 2019-05-18 MED ORDER — CITALOPRAM HYDROBROMIDE 20 MG PO TABS
40.0000 mg | ORAL_TABLET | Freq: Every day | ORAL | Status: DC
  Administered 2019-05-18: 40 mg via ORAL
  Filled 2019-05-18: qty 2

## 2019-05-18 MED ORDER — MIDAZOLAM HCL 2 MG/2ML IJ SOLN
INTRAMUSCULAR | Status: AC
  Filled 2019-05-18: qty 2

## 2019-05-18 MED ORDER — LIDOCAINE 2% (20 MG/ML) 5 ML SYRINGE
INTRAMUSCULAR | Status: AC
  Filled 2019-05-18: qty 5

## 2019-05-18 MED ORDER — HEPARIN SODIUM (PORCINE) 1000 UNIT/ML IJ SOLN
INTRAMUSCULAR | Status: DC | PRN
  Administered 2019-05-18: 5000 [IU]

## 2019-05-18 MED ORDER — CEFAZOLIN SODIUM-DEXTROSE 2-4 GM/100ML-% IV SOLN
2.0000 g | Freq: Three times a day (TID) | INTRAVENOUS | Status: AC
  Administered 2019-05-18 – 2019-05-19 (×2): 2 g via INTRAVENOUS
  Filled 2019-05-18 (×2): qty 100

## 2019-05-18 MED ORDER — ONDANSETRON HCL 4 MG PO TABS: 4.0000 mg | ORAL_TABLET | Freq: Four times a day (QID) | ORAL | Status: DC | PRN

## 2019-05-18 MED ORDER — SODIUM CHLORIDE (PF) 0.9 % IJ SOLN
INTRAMUSCULAR | Status: DC | PRN
  Administered 2019-05-18: 5 mL

## 2019-05-18 MED ORDER — LACTATED RINGERS IV SOLN: INTRAVENOUS | Status: DC

## 2019-05-18 MED ORDER — CYCLOSPORINE 0.05 % OP EMUL
1.0000 [drp] | Freq: Two times a day (BID) | OPHTHALMIC | Status: DC
  Administered 2019-05-18 – 2019-05-19 (×2): 1 [drp] via OPHTHALMIC
  Filled 2019-05-18 (×3): qty 30

## 2019-05-18 SURGICAL SUPPLY — 62 items
BAG DECANTER FOR FLEXI CONT (MISCELLANEOUS) ×2 IMPLANT
BASKET BONE COLLECTION (BASKET) ×2 IMPLANT
BENZOIN TINCTURE PRP APPL 2/3 (GAUZE/BANDAGES/DRESSINGS) ×2 IMPLANT
BLADE CLIPPER SURG (BLADE) IMPLANT
BUR MATCHSTICK NEURO 3.0 LAGG (BURR) ×2 IMPLANT
CANISTER SUCT 3000ML PPV (MISCELLANEOUS) ×2 IMPLANT
CARTRIDGE OIL MAESTRO DRILL (MISCELLANEOUS) ×1 IMPLANT
CONT SPEC 4OZ CLIKSEAL STRL BL (MISCELLANEOUS) ×4 IMPLANT
COVER BACK TABLE 60X90IN (DRAPES) ×2 IMPLANT
COVER WAND RF STERILE (DRAPES) IMPLANT
DERMABOND ADVANCED (GAUZE/BANDAGES/DRESSINGS) ×1
DERMABOND ADVANCED .7 DNX12 (GAUZE/BANDAGES/DRESSINGS) ×1 IMPLANT
DIFFUSER DRILL AIR PNEUMATIC (MISCELLANEOUS) ×2 IMPLANT
DRAPE C-ARM 42X72 X-RAY (DRAPES) ×2 IMPLANT
DRAPE C-ARMOR (DRAPES) ×2 IMPLANT
DRAPE LAPAROTOMY 100X72X124 (DRAPES) ×2 IMPLANT
DRAPE POUCH INSTRU U-SHP 10X18 (DRAPES) IMPLANT
DRAPE SURG 17X23 STRL (DRAPES) ×2 IMPLANT
DRSG OPSITE POSTOP 4X6 (GAUZE/BANDAGES/DRESSINGS) ×2 IMPLANT
DURAPREP 26ML APPLICATOR (WOUND CARE) ×2 IMPLANT
ELECT REM PT RETURN 9FT ADLT (ELECTROSURGICAL) ×2
ELECTRODE REM PT RTRN 9FT ADLT (ELECTROSURGICAL) ×1 IMPLANT
EVACUATOR 1/8 PVC DRAIN (DRAIN) IMPLANT
GAUZE 4X4 16PLY RFD (DISPOSABLE) IMPLANT
GLOVE BIO SURGEON STRL SZ7 (GLOVE) IMPLANT
GLOVE BIO SURGEON STRL SZ8 (GLOVE) ×4 IMPLANT
GLOVE BIOGEL PI IND STRL 7.0 (GLOVE) IMPLANT
GLOVE BIOGEL PI INDICATOR 7.0 (GLOVE)
GOWN STRL REUS W/ TWL LRG LVL3 (GOWN DISPOSABLE) IMPLANT
GOWN STRL REUS W/ TWL XL LVL3 (GOWN DISPOSABLE) ×2 IMPLANT
GOWN STRL REUS W/TWL 2XL LVL3 (GOWN DISPOSABLE) IMPLANT
GOWN STRL REUS W/TWL LRG LVL3 (GOWN DISPOSABLE)
GOWN STRL REUS W/TWL XL LVL3 (GOWN DISPOSABLE) ×2
HEMOSTAT POWDER KIT SURGIFOAM (HEMOSTASIS) ×4 IMPLANT
KIT BASIN OR (CUSTOM PROCEDURE TRAY) ×2 IMPLANT
KIT BONE MRW ASP ANGEL CPRP (KITS) ×2 IMPLANT
KIT TURNOVER KIT B (KITS) ×2 IMPLANT
MILL MEDIUM DISP (BLADE) ×2 IMPLANT
NEEDLE HYPO 25X1 1.5 SAFETY (NEEDLE) ×2 IMPLANT
NS IRRIG 1000ML POUR BTL (IV SOLUTION) ×2 IMPLANT
OIL CARTRIDGE MAESTRO DRILL (MISCELLANEOUS) ×2
PACK LAMINECTOMY NEURO (CUSTOM PROCEDURE TRAY) ×2 IMPLANT
PAD ARMBOARD 7.5X6 YLW CONV (MISCELLANEOUS) ×6 IMPLANT
PUTTY DBM ALLOSYNC PURE 10CC (Putty) ×2 IMPLANT
ROD TI LORDOTIC 5.5X45 (Rod) ×4 IMPLANT
SCREW MOD INVICTUS 6.5X40 (Screw) ×8 IMPLANT
SCREW POLYAXIAL TULIP (Screw) ×8 IMPLANT
SET SCREW (Screw) ×4 IMPLANT
SET SCREW SPNE (Screw) ×4 IMPLANT
SPACER IDENTITI PS 10X9X30 10D (Spacer) ×4 IMPLANT
SPONGE LAP 4X18 RFD (DISPOSABLE) IMPLANT
SPONGE SURGIFOAM ABS GEL 100 (HEMOSTASIS) ×2 IMPLANT
STRIP CLOSURE SKIN 1/2X4 (GAUZE/BANDAGES/DRESSINGS) ×2 IMPLANT
SUT VIC AB 0 CT1 18XCR BRD8 (SUTURE) ×1 IMPLANT
SUT VIC AB 0 CT1 8-18 (SUTURE) ×1
SUT VIC AB 2-0 CP2 18 (SUTURE) IMPLANT
SUT VIC AB 3-0 SH 8-18 (SUTURE) ×4 IMPLANT
SYR CONTROL 10ML LL (SYRINGE) ×2 IMPLANT
TOWEL GREEN STERILE (TOWEL DISPOSABLE) ×2 IMPLANT
TOWEL GREEN STERILE FF (TOWEL DISPOSABLE) ×2 IMPLANT
TRAY FOLEY MTR SLVR 16FR STAT (SET/KITS/TRAYS/PACK) ×2 IMPLANT
WATER STERILE IRR 1000ML POUR (IV SOLUTION) ×2 IMPLANT

## 2019-05-18 NOTE — Plan of Care (Signed)
New admit. Patient stable.

## 2019-05-18 NOTE — Anesthesia Procedure Notes (Signed)
Procedure Name: Intubation Date/Time: 05/18/2019 1:51 PM Performed by: Moshe Salisbury, CRNA Pre-anesthesia Checklist: Patient identified, Emergency Drugs available, Suction available and Patient being monitored Patient Re-evaluated:Patient Re-evaluated prior to induction Oxygen Delivery Method: Circle System Utilized Preoxygenation: Pre-oxygenation with 100% oxygen Induction Type: IV induction Ventilation: Mask ventilation without difficulty Laryngoscope Size: Mac and 3 Grade View: Grade I Tube type: Oral Tube size: 7.5 mm Number of attempts: 1 Airway Equipment and Method: Stylet Placement Confirmation: ETT inserted through vocal cords under direct vision,  positive ETCO2 and breath sounds checked- equal and bilateral Secured at: 21 cm Tube secured with: Tape Dental Injury: Teeth and Oropharynx as per pre-operative assessment

## 2019-05-18 NOTE — Op Note (Signed)
05/18/2019  4:33 PM  PATIENT:  Alexis Fox  63 y.o. female  PRE-OPERATIVE DIAGNOSIS: Adjacent level spondylolisthesis with stenosis L4-5 with back and leg pain  POST-OPERATIVE DIAGNOSIS:  same  PROCEDURE:   1. Decompressive lumbar laminectomy L4-5 requiring more work than would be required for a simple exposure of the disk for PLIF in order to adequately decompress the neural elements and address the spinal stenosis 2. Posterior lumbar interbody fusion L4-5 using porous titanium interbody cages packed with morcellized allograft and autograft soaked with a bone marrow aspirate obtained through a separate fascial incision over the right iliac crest 3. Posterior fixation L4-5 using Alphatec cortical pedicle screws.  4. Intertransverse arthrodesis L4-5 using morcellized autograft and allograft.  SURGEON:  Sherley Bounds, MD  ASSISTANTS: Glenford Peers, FNP  ANESTHESIA:  General  EBL: 200 ml  Total I/O In: 1000 [I.V.:1000] Out: 500 [Urine:300; Blood:200]  BLOOD ADMINISTERED:none  DRAINS: none   INDICATION FOR PROCEDURE: This patient presented with your back and bilateral leg pain that was progressive. Imaging revealed adjacent level spondylolisthesis with stenosis at L4-5 above her previous successful L5-S1 fusion. The patient tried a reasonable attempt at conservative medical measures without relief. I recommended decompression and instrumented fusion to address the stenosis as well as the segmental  instability.  Patient understood the risks, benefits, and alternatives and potential outcomes and wished to proceed.  PROCEDURE DETAILS:  The patient was brought to the operating room. After induction of generalized endotracheal anesthesia the patient was rolled into the prone position on chest rolls and all pressure points were padded. The patient's lumbar region was cleaned and then prepped with DuraPrep and draped in the usual sterile fashion. Anesthesia was injected and then a dorsal  midline incision was made and carried down to the lumbosacral fascia. The fascia was opened and the paraspinous musculature was taken down in a subperiosteal fashion to expose L4-5 as well as the previously placed instrumentation.  The locking caps were removed and the rods were removed and then each screw from L5-S1 was removed in succession.  We dried the holes.  A self-retaining retractor was placed. Intraoperative fluoroscopy confirmed my level, and I started with placement of the L4 cortical pedicle screws. The pedicle screw entry zones were identified utilizing surface landmarks and  AP and lateral fluoroscopy. I scored the cortex with the high-speed drill and then used the hand drill to drill an upward and outward direction into the pedicle. I then tapped line to line. I then placed a 6.5 x 40 mm cortical pedicle screw into the pedicles of L4 bilaterally.  I then dissected in a suprafascial plane to expose the iliac crest.  Open the fascia used a Jamshidi needle to extract 60 cc of bone marrow aspirate from the iliac crest.  This was then spun down by [facility] device and 2 to 4 cc of  BMAC was soaked on morselized allograft for later arthrodesis.  I dried the hole with Surgifoam and closed the fascia.  I then turned my attention to the decompression and complete lumbar laminectomies, hemi- facetectomies, and foraminotomies were performed at L4-5. The patient had significant spinal stenosis and this required more work than would be required for a simple exposure of the disc for posterior lumbar interbody fusion which would only require a limited laminotomy. Much more generous decompression and generous foraminotomy was undertaken in order to adequately decompress the neural elements and address the patient's leg pain. The yellow ligament was removed to expose the underlying  dura and nerve roots, and generous foraminotomies were performed to adequately decompress the neural elements. Both the exiting and  traversing nerve roots were decompressed on both sides until a coronary dilator passed easily along the nerve roots. Once the decompression was complete, I turned my attention to the posterior lower lumbar interbody fusion. The epidural venous vasculature was coagulated and cut sharply. Disc space was incised and the initial discectomy was performed with pituitary rongeurs. The disc space was distracted with sequential distractors to a height of 11 millimeters. We then used a series of scrapers and shavers to prepare the endplates for fusion. The midline was prepared with Epstein curettes. Once the complete discectomy was finished, we packed an appropriate sized interbody cage with local autograft and morcellized allograft, gently retracted the nerve root, and tapped the cage into position at L4-5.  The midline between the cages was packed with morselized autograft and allograft. We then turned our attention to the placement of the lower pedicle screws. We the old pedicle screw holes palpated with a ball probe to assure no break in the cortex. We then placed 6.5 x 45 mm pedicle screws into the pedicles bilaterally at L5. We then decorticated the transverse processes and laid a mixture of morcellized autograft and allograft out over these to perform intertransverse arthrodesis at L4-5. We then placed lordotic rods into the multiaxial screw heads of the pedicle screws and locked these in position with the locking caps and anti-torque device. We then checked our construct with AP and lateral fluoroscopy. Irrigated with copious amounts of bacitracin-containing saline solution. Inspected the nerve roots once again to assure adequate decompression, lined to the dura with Gelfoam, placed powdered vancomycin into the wound, and closed the muscle and the fascia with 0 Vicryl. Closed the subcutaneous tissues with 2-0 Vicryl and subcuticular tissues with 3-0 Vicryl. The skin was closed with benzoin and Steri-Strips. Dressing  was then applied, the patient was awakened from general anesthesia and transported to the recovery room in stable condition. At the end of the procedure all sponge, needle and instrument counts were correct.   PLAN OF CARE: admit to inpatient  PATIENT DISPOSITION:  PACU - hemodynamically stable.   Delay start of Pharmacological VTE agent (>24hrs) due to surgical blood loss or risk of bleeding:  yes

## 2019-05-18 NOTE — Transfer of Care (Signed)
Immediate Anesthesia Transfer of Care Note  Patient: Alexis Fox  Procedure(s) Performed: Posterior Lumbar Interbody Fusion - Lumbar Four-Lumbar Five,  removal hardware Lumbar Five-Sacral One (N/A Back)  Patient Location: PACU  Anesthesia Type:General  Level of Consciousness: drowsy and patient cooperative  Airway & Oxygen Therapy: Patient Spontanous Breathing and Patient connected to nasal cannula oxygen  Post-op Assessment: Report given to RN, Post -op Vital signs reviewed and stable and Patient moving all extremities  Post vital signs: Reviewed and stable  Last Vitals:  Vitals Value Taken Time  BP 109/59 05/18/19 1637  Temp    Pulse 64 05/18/19 1639  Resp 13 05/18/19 1639  SpO2 99 % 05/18/19 1639  Vitals shown include unvalidated device data.  Last Pain:  Vitals:   05/18/19 1143  TempSrc:   PainSc: 0-No pain      Patients Stated Pain Goal: 2 (09/18/50 0258)  Complications: No apparent anesthesia complications

## 2019-05-18 NOTE — H&P (Signed)
Subjective: Patient is a 63 y.o. female admitted for spondylolisthesis L4-5. Onset of symptoms was several months ago, gradually worsening since that time.  The pain is rated severe, and is located at the across the lower back and radiates to legs. The pain is described as aching and occurs all day. The symptoms have been progressive. Symptoms are exacerbated by exercise. MRI or CT showed mobile spondylolisthesis L4-5 above previous L5-S1 fusion  Past Medical History:  Diagnosis Date  . Anxiety   . Arthritis    knees  . Breast cancer ([place])   . Breast cancer of upper-outer quadrant of left female breast ([place]) 03/20/2016  . C2 cervical fracture ([place])    10/22/18, following fall from ladder  . Cholecystitis   . Complication of anesthesia    BP "crashes" post op  . Depression   . Family history of brain cancer   . Family history of breast cancer   . Hot flashes   . Personal history of radiation therapy   . Restless leg syndrome   . Sleep apnea 10/03/2018    Past Surgical History:  Procedure Laterality Date  . BREAST BIOPSY    . BREAST LUMPECTOMY    . BUNIONECTOMY    . CHOLECYSTECTOMY N/A 10/04/2015   Procedure: LAPAROSCOPIC CHOLECYSTECTOMY WITH INTRAOPERATIVE CHOLANGIOGRAM;  Surgeon: Greer Pickerel, MD;  Location: [place];  Service: General;  Laterality: N/A;  . ELBOW SURGERY     right for epicondylitis 2010   . FOOT SURGERY     1983 -tarsal tunnel release  . IR GENERIC HISTORICAL  10/26/2016   IR CV LINE INJECTION 10/26/2016 [facility]INTER[facility] RAD  . LUMBAR DISC SURGERY  04/04/2012  . PORT-A-CATH REMOVAL N/A 11/15/2016   Procedure: REMOVAL PORT-A-CATH;  Surgeon: Rolm Bookbinder, MD;  Location: [place];  Service: General;  Laterality: N/A;  . PORTACATH PLACEMENT Right 04/02/2016   Procedure: INSERTION PORT-A-CATH WITH [place];  Surgeon: Rolm Bookbinder, MD;  Location: [place];  Service: General;  Laterality: Right;  . RADIOACTIVE SEED GUIDED PARTIAL  MASTECTOMY/AXILLARY SENTINEL NODE BIOPSY/AXILLARY NODE DISSECTION Left 09/12/2016   Procedure: LEFT BREAST SEED GUIDED LUMPECTOMY, LEFT AXILLARY SENTINEL NODE BIOPSY, LEFT SEED GUIDED AXILLARY NODE EXCISION( TARGETED AXILLARY DISSECTION), BLUE DYE INJECTION;  Surgeon: Rolm Bookbinder, MD;  Location: [place];  Service: General;  Laterality: Left;  . SPINAL FUSION  5/12   L5-S1  . TARSAL TUNNEL RELEASE      Prior to Admission medications   Medication Sig Start Date End Date Taking? Authorizing Provider  buPROPion (WELLBUTRIN XL) 150 MG 24 hr tablet Take 150 mg by mouth daily. 03/06/16  Yes [provider]  cetirizine (ZYRTEC) 10 MG tablet Take 10 mg by mouth daily.   Yes [provider]  Cholecalciferol (VITAMIN D3) 50 MCG (2000 UT) TABS Take 2,000 Units by mouth daily.    Yes [provider]  citalopram (CELEXA) 40 MG tablet Take 40 mg by mouth at bedtime.  12/21/15  Yes [provider]  clonazePAM (KLONOPIN) 1 MG tablet Take 1 mg by mouth at bedtime.    Yes [provider]  cycloSPORINE (RESTASIS) 0.05 % ophthalmic emulsion Place 1 drop into both eyes 2 (two) times daily.   Yes [provider]  esomeprazole (NEXIUM) 20 MG capsule Take 20 mg by mouth daily at 12 noon.   Yes [provider]  HYDROcodone-acetaminophen (NORCO/VICODIN) 5-325 MG tablet Take 1-2 tablets by mouth every 6 (six) hours as needed for moderate  pain. 03/26/19 03/25/20 Yes Petrarca, Mike Craze, PA-C  pramipexole (MIRAPEX) 0.5 MG tablet Take 1/2 tab at 4 pm and 1 tab at night time prn. Patient taking differently: Take 0.5 mg by mouth at bedtime.  09/15/18  Yes Dohmeier, Asencion Partridge, MD  zolpidem (AMBIEN) 5 MG tablet Take 5 mg by mouth at bedtime.  09/17/18  Yes [provider]   Allergies  Allergen Reactions  . Turmeric Diarrhea    Severe diarrhea  . Decadron [Dexamethasone]     Exacerbates restless leg    Social History   Tobacco Use  .  Smoking status: Never Smoker  . Smokeless tobacco: Never Used  Substance Use Topics  . Alcohol use: Yes    Alcohol/week: 1.0 standard drinks    Types: 1 Cans of beer per week    Comment: occasional    Family History  Problem Relation Age of Onset  . Heart disease Mother   . Pulmonary fibrosis Mother   . Hypertension Mother   . Cancer Father 32       astocytoma  . Hypertension Brother   . Lymphoma Maternal Aunt   . Anesthesia problems Neg Hx      Review of Systems  Positive ROS: neg  All other systems have been reviewed and were otherwise negative with the exception of those mentioned in the HPI and as above.  Objective: Vital signs in last 24 hours: Temp:  [98.3 F (36.8 C)] 98.3 F (36.8 C) (06/15 1134) Pulse Rate:  [64] 64 (06/15 1134) Resp:  [18] 18 (06/15 1134) BP: (115)/(59) 115/59 (06/15 1134) SpO2:  [96 %] 96 % (06/15 1134) Weight:  [85.7 kg] 85.7 kg (06/15 1143)  General Appearance: Alert, cooperative, no distress, appears stated age Head: Normocephalic, without obvious abnormality, atraumatic Eyes: PERRL, conjunctiva/corneas clear, EOM's intact    Neck: Supple, symmetrical, trachea midline Back: Symmetric, no curvature, ROM normal, no CVA tenderness Lungs:  respirations unlabored Heart: Regular rate and rhythm Abdomen: Soft, non-tender Extremities: Extremities normal, atraumatic, no cyanosis or edema Pulses: 2+ and symmetric all extremities Skin: Skin color, texture, turgor normal, no rashes or lesions  NEUROLOGIC:   Mental status: Alert and oriented x4,  no aphasia, good attention span, fund of knowledge, and memory Motor Exam - grossly normal Sensory Exam - grossly normal Reflexes: 1+ Coordination - grossly normal Gait - grossly normal Balance - grossly normal Cranial Nerves: I: smell Not tested  II: visual acuity  OS: nl    OD: nl  II: visual fields Full to confrontation  II: pupils Equal, round, reactive to light  III,VII: ptosis None   III,IV,VI: extraocular muscles  Full ROM  V: mastication Normal  V: facial light touch sensation  Normal  V,VII: corneal reflex  Present  VII: facial muscle function - upper  Normal  VII: facial muscle function - lower Normal  VIII: hearing Not tested  IX: soft palate elevation  Normal  IX,X: gag reflex Present  XI: trapezius strength  5/5  XI: sternocleidomastoid strength 5/5  XI: neck flexion strength  5/5  XII: tongue strength  Normal    Data Review Lab Results  Component Value Date   WBC 5.2 05/15/2019   HGB 12.8 05/15/2019   HCT 41.8 05/15/2019   MCV 99.5 05/15/2019   PLT 251 05/15/2019   Lab Results  Component Value Date   NA 141 05/15/2019   K 4.2 05/15/2019   CL 109 05/15/2019   CO2 24 05/15/2019   BUN 17 05/15/2019  CREATININE 0.71 05/15/2019   GLUCOSE 94 05/15/2019   Lab Results  Component Value Date   INR 1.0 05/15/2019    Assessment/Plan:  Estimated body mass index is 32.42 kg/m as calculated from the following:   Height as of this encounter: 5\' 4"  (1.626 m).   Weight as of this encounter: 85.7 kg. Patient admitted for PLIF L4-5. Patient has failed a reasonable attempt at conservative therapy.  I explained the condition and procedure to the patient and answered any questions.  Patient wishes to proceed with procedure as planned. Understands risks/ benefits and typical outcomes of procedure.   Eustace Moore 05/18/2019 12:51 PM

## 2019-05-19 MED ORDER — OXYCODONE HCL 5 MG PO TABS: 5.0000 mg | ORAL_TABLET | ORAL | 0 refills | Status: DC | PRN

## 2019-05-19 MED ORDER — METHOCARBAMOL 500 MG PO TABS: 500.0000 mg | ORAL_TABLET | Freq: Three times a day (TID) | ORAL | 1 refills | Status: DC | PRN

## 2019-05-19 MED ORDER — OXYCODONE HCL 5 MG PO TABS
5.0000 mg | ORAL_TABLET | ORAL | Status: DC | PRN
  Administered 2019-05-19 (×2): 10 mg via ORAL
  Filled 2019-05-19 (×2): qty 2

## 2019-05-19 MED FILL — Thrombin For Soln 5000 Unit: CUTANEOUS | Qty: 5000 | Status: AC

## 2019-05-19 MED FILL — Gelatin Absorbable MT Powder: OROMUCOSAL | Qty: 1 | Status: AC

## 2019-05-19 NOTE — Anesthesia Postprocedure Evaluation (Signed)
Anesthesia Post Note  Patient: Alexis Fox  Procedure(s) Performed: Posterior Lumbar Interbody Fusion - Lumbar Four-Lumbar Five,  removal hardware Lumbar Five-Sacral One (N/A Back)     Patient location during evaluation: Other Anesthesia Type: General Level of consciousness: awake and alert Pain management: pain level controlled Vital Signs Assessment: post-procedure vital signs reviewed and stable Respiratory status: spontaneous breathing, nonlabored ventilation, respiratory function stable and patient connected to nasal cannula oxygen Cardiovascular status: blood pressure returned to baseline and stable Postop Assessment: no apparent nausea or vomiting Anesthetic complications: no    Last Vitals:  Vitals:   05/18/19 1745 05/18/19 1800  BP: 105/62 (!) 106/43  Pulse: 69 66  Resp: 14 14  Temp:    SpO2: 97% 93%    Last Pain:  Vitals:   05/19/19 0646  TempSrc:   PainSc: [place]

## 2019-05-19 NOTE — Progress Notes (Signed)
Pt doing well. Pt given D/C instructions with verbal understanding was provided. Pt's incision is clean and dry with no sign of infection. Pt's IV was removed prior to D/C. Pt D/C'd home via wheelchair per MD order. Pt is stable @ D/C and has no other needs at this time. Holli Humbles, RN

## 2019-05-19 NOTE — Discharge Summary (Signed)
Physician Discharge Summary  Patient ID: Alexis Fox MRN: [id number] DOB/AGE: 64-Apr-1957 64 y.o.  Admit date: 05/18/2019 Discharge date: 05/19/2019  Admission Diagnoses: spondylolisthesis L4-5    Discharge Diagnoses: same   Discharged Condition: good  Hospital Course: The patient was admitted on 05/18/2019 and taken to the operating room where the patient underwent PLIF L4-5. The patient tolerated the procedure well and was taken to the recovery room and then to the floor in stable condition. The hospital course was routine. There were no complications. The wound remained clean dry and intact. Pt had appropriate back soreness. No complaints of leg pain or new N/T/W. The patient remained afebrile with stable vital signs, and tolerated a regular diet. The patient continued to increase activities, and pain was well controlled with oral pain medications.   Consults: None  Significant Diagnostic Studies:  Results for orders placed or performed during the hospital encounter of 05/15/19  Surgical pcr screen   Specimen: Nasal Mucosa; Nasal Swab  Result Value Ref Range   MRSA, PCR NEGATIVE NEGATIVE   Staphylococcus aureus NEGATIVE NEGATIVE  Basic metabolic panel  Result Value Ref Range   Sodium 141 135 - 145 mmol/L   Potassium 4.2 3.5 - 5.1 mmol/L   Chloride 109 98 - 111 mmol/L   CO2 24 22 - 32 mmol/L   Glucose, Bld 94 70 - 99 mg/dL   BUN 17 8 - 23 mg/dL   Creatinine, Ser 0.71 0.44 - 1.00 mg/dL   Calcium 9.5 8.9 - 10.3 mg/dL   GFR calc non Af Amer >60 >60 mL/min   GFR calc Af Amer >60 >60 mL/min   Anion gap 8 5 - 15  CBC WITH DIFFERENTIAL  Result Value Ref Range   WBC 5.2 4.0 - 10.5 K/uL   RBC 4.20 3.87 - 5.11 MIL/uL   Hemoglobin 12.8 12.0 - 15.0 g/dL   HCT 41.8 36.0 - 46.0 %   MCV 99.5 80.0 - 100.0 fL   MCH 30.5 26.0 - 34.0 pg   MCHC 30.6 30.0 - 36.0 g/dL   RDW 13.0 11.5 - 15.5 %   Platelets 251 150 - 400 K/uL   nRBC 0.0 0.0 - 0.2 %   Neutrophils Relative % 57 %   Neutro  Abs 3.0 1.7 - 7.7 K/uL   Lymphocytes Relative 25 %   Lymphs Abs 1.3 0.7 - 4.0 K/uL   Monocytes Relative 10 %   Monocytes Absolute 0.5 0.1 - 1.0 K/uL   Eosinophils Relative 7 %   Eosinophils Absolute 0.4 0.0 - 0.5 K/uL   Basophils Relative 1 %   Basophils Absolute 0.0 0.0 - 0.1 K/uL   Immature Granulocytes 0 %   Abs Immature Granulocytes 0.02 0.00 - 0.07 K/uL  Protime-INR  Result Value Ref Range   Prothrombin Time 13.5 11.4 - 15.2 seconds   INR 1.0 0.8 - 1.2  Type and screen All Cardiac and thoracic surgeries, spinal fusions, myomectomies, craniotomies, colon & liver resections, total joint revisions, same day c-section with placenta previa or accreta.  Result Value Ref Range   ABO/RH(D) A POS    Antibody Screen NEG    Sample Expiration 05/29/2019,2359    Extend sample reason      NO TRANSFUSIONS OR PREGNANCY IN THE PAST 3 MONTHS Performed at [facility], [address], [place], [place] [address]     Chest 2 View  Result Date: 05/15/2019 CLINICAL DATA:  Pre-op respiratory exam.  Spondylolisthesis. EXAM: CHEST - 2 VIEW  COMPARISON:  10/22/2018 FINDINGS: The heart size and mediastinal contours are within normal limits. No evidence of pulmonary infiltrate or edema. No evidence of pleural effusion. A calcified granuloma is seen in the left lower lobe, and several calcified left hilar lymph nodes are also consistent with old granulomatous disease. No evidence of pulmonary infiltrate or edema. No evidence of pleural effusion. Surgical clips from previous left breast lumpectomy. The visualized skeletal structures are unremarkable. IMPRESSION: No active cardiopulmonary disease. Electronically Signed   By: [name] M.D.   On: 05/15/2019 16:00   Dg Lumbar Spine 2-3 Views  Result Date: 05/18/2019 CLINICAL DATA:  Back pain EXAM: LUMBAR SPINE - 2-3 VIEW COMPARISON:  04/09/2019 FINDINGS: The patient has undergone posterior fusion from L4-L5 with interbody spacer. The alignment appears near  anatomic. Three images were saved. A total fluoroscopy time of 59 seconds was noted. IMPRESSION: Status post lumbar fusion as above. Electronically Signed   By: Constance Holster M.D.   On: 05/18/2019 17:13   Ct Cervical Spine Wo Contrast  Result Date: 04/29/2019 CLINICAL DATA:  Follow-up C2 fracture. EXAM: CT CERVICAL SPINE WITHOUT CONTRAST TECHNIQUE: Multidetector CT imaging of the cervical spine was performed without intravenous contrast. Multiplanar CT image reconstructions were also generated. COMPARISON:  Cervical spine CT dated February 11, 2019. FINDINGS: Alignment: Unchanged trace anterolisthesis at C2-C3 and from C7-T1 through T2-T3. Skull base and vertebrae: Progressive, nearly healed C2 fracture. No acute fracture. No primary bone lesion or focal pathologic process. Soft tissues and spinal canal: No prevertebral fluid or swelling. No visible canal hematoma. Disc levels: Unchanged multilevel cervical spondylosis, moderate at C5-C6 and C6-C7. Upper chest: Negative. Other: None. IMPRESSION: 1. Nearly healed C2 fracture with unchanged trace anterolisthesis at C2-C3. Electronically Signed   By: Titus Dubin M.D.   On: 04/29/2019 14:59   Dg C-arm 1-60 Min  Result Date: 05/18/2019 CLINICAL DATA:  Back pain. EXAM: DG C-ARM 61-120 MIN COMPARISON:  04/09/2019 FINDINGS: The patient has undergone posterior fusion from L4-L5 with interbody spacer. The alignment appears near anatomic. Three images were saved. A total fluoroscopy time of 59 seconds was noted. IMPRESSION: Status post lumbar fusion as above. Electronically Signed   By: Constance Holster M.D.   On: 05/18/2019 17:12    Antibiotics:  Anti-infectives (From admission, onward)   Start     Dose/Rate Route Frequency Ordered Stop   05/18/19 2130  ceFAZolin (ANCEF) IVPB 2g/100 mL premix     2 g 200 mL/hr over 30 Minutes Intravenous Every 8 hours 05/18/19 1822 05/19/19 0605   05/18/19 1434  vancomycin (VANCOCIN) powder  Status:  Discontinued        As needed 05/18/19 1434 05/18/19 1633   05/18/19 1430  bacitracin 50,000 Units in sodium chloride 0.9 % 500 mL irrigation  Status:  Discontinued       As needed 05/18/19 1431 05/18/19 1633   05/18/19 1145  ceFAZolin (ANCEF) IVPB 2g/100 mL premix     2 g 200 mL/hr over 30 Minutes Intravenous On call to O.R. 05/18/19 1130 05/18/19 1336   05/18/19 1135  ceFAZolin (ANCEF) 2-4 GM/100ML-% IVPB    Note to Pharmacy: Therese Sarah   : cabinet override      05/18/19 1135 05/18/19 1336      Discharge Exam: Blood pressure (!) 106/43, pulse 66, temperature (!) 97 F (36.1 C), resp. rate 14, height 5\' 4"  (1.626 m), weight 85.7 kg, SpO2 93 %. Neurologic: Grossly normal dressing dry  Discharge Medications:   Allergies  as of 05/19/2019      Reactions   Turmeric Diarrhea   Severe diarrhea   Decadron [dexamethasone]    Exacerbates restless leg      Medication List    STOP taking these medications   HYDROcodone-acetaminophen 5-325 MG tablet Commonly known as: NORCO/VICODIN     TAKE these medications   buPROPion 150 MG 24 hr tablet Commonly known as: WELLBUTRIN XL Take 150 mg by mouth daily.   cetirizine 10 MG tablet Commonly known as: ZYRTEC Take 10 mg by mouth daily.   citalopram 40 MG tablet Commonly known as: CELEXA Take 40 mg by mouth at bedtime.   clonazePAM 1 MG tablet Commonly known as: KLONOPIN Take 1 mg by mouth at bedtime.   cycloSPORINE 0.05 % ophthalmic emulsion Commonly known as: RESTASIS Place 1 drop into both eyes 2 (two) times daily.   esomeprazole 20 MG capsule Commonly known as: NEXIUM Take 20 mg by mouth daily at 12 noon.   methocarbamol 500 MG tablet Commonly known as: ROBAXIN Take 1 tablet (500 mg total) by mouth every 8 (eight) hours as needed for muscle spasms.   oxyCODONE 5 MG immediate release tablet Commonly known as: Oxy IR/ROXICODONE Take 1 tablet (5 mg total) by mouth every 3 (three) hours as needed for moderate pain ((score 4 to 6)).    pramipexole 0.5 MG tablet Commonly known as: MIRAPEX Take 1/2 tab at 4 pm and 1 tab at night time prn. What changed:   how much to take  how to take this  when to take this  additional instructions   Vitamin D3 50 MCG (2000 UT) Tabs Take 2,000 Units by mouth daily.   zolpidem 5 MG tablet Commonly known as: AMBIEN Take 5 mg by mouth at bedtime.            Durable Medical Equipment  (From admission, onward)         Start     Ordered   05/18/19 1823  DME Walker rolling  Once    Question:  Patient needs a walker to treat with the following condition  Answer:  S/P lumbar fusion   05/18/19 1822   05/18/19 1823  DME 3 n 1  Once     05/18/19 1822          Disposition: home   Final Dx: PLIF L4-5  Discharge Instructions     Remove dressing in 72 hours   Complete by: As directed    Call MD for:  difficulty breathing, headache or visual disturbances   Complete by: As directed    Call MD for:  persistant nausea and vomiting   Complete by: As directed    Call MD for:  redness, tenderness, or signs of infection (pain, swelling, redness, odor or green/yellow discharge around incision site)   Complete by: As directed    Call MD for:  severe uncontrolled pain   Complete by: As directed    Call MD for:  temperature >100.4   Complete by: As directed    Diet - low sodium heart healthy   Complete by: As directed    Discharge instructions   Complete by: As directed    May shower, no lifting more than 8 lbs, no bending or twisting   Increase activity slowly   Complete by: As directed       Follow-up Information    Eustace Moore, MD. Schedule an appointment as soon as possible for a visit in 2 week(s).  Specialty: Neurosurgery Contact information: [address] [place] [address] [place] [address] [phone]            Signed: Eustace Moore 05/19/2019, 8:08 AM

## 2019-05-19 NOTE — Progress Notes (Signed)
Orthopedic Tech Progress Note Patient Details:  Alexis Fox May 01, 1956 [id number] RN called requesting soft collar for patient. So I applied it to patient Ortho Devices Type of Ortho Device: Soft collar Ortho Device/Splint Location: neck Ortho Device/Splint Interventions: Adjustment, Application, Ordered   Post Interventions Patient Tolerated: Well Instructions Provided: Care of device, Adjustment of device   Janit Pagan 05/19/2019, 8:19 AM

## 2019-05-19 NOTE — Evaluation (Signed)
Physical Therapy Evaluation Patient Details Name: Alexis Fox MRN: [id number] DOB: [birth year] Today's Date: 05/19/2019   History of Present Illness  63 y.o. female s/p laminectomy L4-5 05/18/19. PMH including anxiety, arthritis, breast cancer, C2 fx (2019), depression, and lumbar sx (2013).  Clinical Impression  Pt admitted with above diagnosis. Pt currently with functional limitations due to the deficits listed below (see PT Problem List). At the time of PT eval pt was able to perform transfers and ambulation with gross min guard assist for balance support and safety. Pt was educated on precautions, brace application/wearing schedule, car transfer, activity progression. Pt lives in an [place] accommodated "cottage" that was built behind son's home. From a functional standpoint, is likely ready for d/c, however pt reports she wants pain to be better managed prior to d/c. Acutely, pt will benefit from skilled PT to increase their independence and safety with mobility to allow discharge to the venue listed below.       Follow Up Recommendations No PT follow up;Supervision for mobility/OOB    Equipment Recommendations  None recommended by PT    Recommendations for Other Services       Precautions / Restrictions Precautions Precautions: Fall;Back Precaution Booklet Issued: Yes (comment) Precaution Comments: Provided back handout and education on back precautions and compensatory techniques for ADLs Required Braces or Orthoses: Spinal Brace;Cervical Brace Cervical Brace: Soft collar;For comfort Spinal Brace: Lumbar corset;Applied in sitting position Restrictions Weight Bearing Restrictions: No      Mobility  Bed Mobility Overal bed mobility: Modified Independent             General bed mobility comments: Increased time and use of bed rails  Transfers Overall transfer level: Needs assistance Equipment used: Rolling walker (2 wheeled) Transfers: Sit to/from Stand Sit to Stand:  Supervision         General transfer comment: Supervision for safety. VC's for hand placement on seated surface for safety.   Ambulation/Gait Ambulation/Gait assistance: Min guard Gait Distance (Feet): 150 Feet Assistive device: Rolling walker (2 wheeled) Gait Pattern/deviations: Step-through pattern;Decreased stride length;Trunk flexed Gait velocity: Decreased Gait velocity interpretation: 1.31 - 2.62 ft/sec, indicative of limited community ambulator General Gait Details: VC's for improved posture. Pt ambulating slow and guarded 2 cervical pain.   Stairs            Wheelchair Mobility    Modified Rankin (Stroke Patients Only)       Balance Overall balance assessment: Mild deficits observed, not formally tested                                           Pertinent Vitals/Pain Pain Assessment: Faces Faces Pain Scale: Hurts even more Pain Location: Back and neck Pain Descriptors / Indicators: Constant;Discomfort;Grimacing;Guarding Pain Intervention(s): Monitored during session    Home Living Family/patient expects to be discharged to:: Private residence Living Arrangements: Spouse/significant other Available Help at Discharge: Family;Available 24 hours/day Type of Home: House Home Access: Level entry;Ramped entrance     Home Layout: One level Home Equipment: Clinical cytogeneticist - 2 wheels;Grab bars - toilet;Grab bars - tub/shower;Hand held shower head      Prior Function Level of Independence: Independent         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      Hand Dominance   Dominant Hand: Right    Extremity/Trunk Assessment   Upper  Extremity Assessment Upper Extremity Assessment: Overall WFL for tasks assessed    Lower Extremity Assessment Lower Extremity Assessment: Generalized weakness(Mildly weak consistent with pre-op diagnosis)    Cervical / Trunk Assessment Cervical / Trunk Assessment: Other  exceptions Cervical / Trunk Exceptions: s/p back sx - reports significant surgical pain  Communication   Communication: No difficulties  Cognition Arousal/Alertness: Awake/alert Behavior During Therapy: WFL for tasks assessed/performed;Anxious Overall Cognitive Status: Within Functional Limits for tasks assessed                                 General Comments: Pt agreeable to participate in therapy and wanting education. Noting increased anxiety about pain and sitting for longer periods of time.       General Comments General comments (skin integrity, edema, etc.): MD arriving during session and pt reporting increased cervical pain. Pt requesting cervical collar for support    Exercises     Assessment/Plan    PT Assessment Patient needs continued PT services  PT Problem List Decreased strength;Decreased range of motion;Decreased activity tolerance;Decreased balance;Decreased mobility;Decreased knowledge of use of DME;Decreased safety awareness;Decreased knowledge of precautions;Pain       PT Treatment Interventions DME instruction;Gait training;Functional mobility training;Therapeutic activities;Therapeutic exercise;Neuromuscular re-education;Balance training;Patient/family education    PT Goals (Current goals can be found in the Care Plan section)  Acute Rehab PT Goals Patient Stated Goal: "Go home" PT Goal Formulation: With patient Time For Goal Achievement: 05/26/19 Potential to Achieve Goals: Good    Frequency Min 5X/week   Barriers to discharge        Co-evaluation               AM-PAC PT "6 Clicks" Mobility  Outcome Measure Help needed turning from your back to your side while in a flat bed without using bedrails?: None Help needed moving from lying on your back to sitting on the side of a flat bed without using bedrails?: None Help needed moving to and from a bed to a chair (including a wheelchair)?: A Little Help needed standing up from a  chair using your arms (e.g., wheelchair or bedside chair)?: None Help needed to walk in hospital room?: A Little Help needed climbing 3-5 steps with a railing? : A Little 6 Click Score: 21    End of Session Equipment Utilized During Treatment: Gait belt;Back brace Activity Tolerance: Patient tolerated treatment well Patient left: in chair;with call bell/phone within reach Nurse Communication: Mobility status PT Visit Diagnosis: Unsteadiness on feet (R26.81);Pain;Other symptoms and signs involving the nervous system (R29.898) Pain - part of body: (back and neck)    Time: 3335-4562 PT Time Calculation (min) (ACUTE ONLY): 11 min   Charges:   PT Evaluation $PT Eval Moderate Complexity: 1 Mod          Rolinda Roan, PT, DPT Acute Rehabilitation Services Pager: [phone] Office: [phone]   Thelma Comp 05/19/2019, 10:15 AM

## 2019-05-19 NOTE — Discharge Instructions (Signed)

## 2019-05-19 NOTE — Evaluation (Signed)
Occupational Therapy Evaluation Patient Details Name: Alexis Fox MRN: [id number] DOB: 20-Nov-1956 Today's Date: 05/19/2019    History of Present Illness 63 y.o. female s/p laminectomy L4-5 05/18/19. PMH including anxiety, arthritis, breast cancer, C2 fx (2019), depression, and lumbar sx (2013).   Clinical Impression   PTA, pt was living with her husband and was independent. Currently, pt requires supervision for ADLs and functional mobility. Provided education and handout on back precautions, brace management, bed mobility, sleep positioning, LB ADLs, toileting, and shower transfer; pt demonstrated understanding. Answered all pt questions. Recommend dc home once medically stable per physician. All acute OT needs met and will sign off. Thank you.    Follow Up Recommendations  No OT follow up    Equipment Recommendations  None recommended by OT    Recommendations for Other Services PT consult     Precautions / Restrictions Precautions Precautions: Fall;Back Precaution Booklet Issued: Yes (comment) Precaution Comments: Provided back handout and education on back precautions and compensatory techniques for ADLs Required Braces or Orthoses: Spinal Brace Spinal Brace: Lumbar corset;Applied in sitting position Restrictions Weight Bearing Restrictions: No      Mobility Bed Mobility Overal bed mobility: Modified Independent             General bed mobility comments: Increased time and use of bed rails  Transfers Overall transfer level: Needs assistance   Transfers: Sit to/from Stand Sit to Stand: Supervision         General transfer comment: Supervision for safety    Balance Overall balance assessment: Mild deficits observed, not formally tested                                         ADL either performed or assessed with clinical judgement   ADL Overall ADL's : Needs assistance/impaired                                        General ADL Comments: Pt able to perform ADLs and functional mobility at at supervision level for safety. Pt with increased anxiety when walking and benefits from supervision. Providing education on back precautions, compensatory techniques for LB ADLs, brace management, bed mobility, and toileting. Pt verbalized and demonstrated understanding.      Vision Baseline Vision/History: Wears glasses Patient Visual Report: No change from baseline       Perception     Praxis      Pertinent Vitals/Pain Pain Assessment: Faces Faces Pain Scale: Hurts even more Pain Location: Back and neck Pain Descriptors / Indicators: Constant;Discomfort;Grimacing;Guarding Pain Intervention(s): Monitored during session;Limited activity within patient's tolerance;Repositioned;Premedicated before session     Hand Dominance Right   Extremity/Trunk Assessment Upper Extremity Assessment Upper Extremity Assessment: Overall WFL for tasks assessed       Cervical / Trunk Assessment Cervical / Trunk Assessment: Other exceptions Cervical / Trunk Exceptions: s/p back sx   Communication Communication Communication: No difficulties   Cognition Arousal/Alertness: Awake/alert Behavior During Therapy: WFL for tasks assessed/performed;Anxious Overall Cognitive Status: Within Functional Limits for tasks assessed                                 General Comments: Pt agreeable to participate in therapy and wanting education. Noting increased anxiety  about pain and sitting for longer periods of time.    General Comments  MD arriving during session and pt reporting increased cervical pain. Pt requesting cervical collar for support    Exercises     Shoulder Instructions      Home Living Family/patient expects to be discharged to:: Private residence Living Arrangements: Spouse/significant other Available Help at Discharge: Family;Available 24 hours/day Type of Home: House Home Access: Level  entry;Ramped entrance     Home Layout: One level     Bathroom Shower/Tub: Occupational psychologist: Handicapped height Bathroom Accessibility: Yes   Home Equipment: Clinical cytogeneticist - 2 wheels;Grab bars - toilet;Grab bars - tub/shower;Hand held shower head          Prior Functioning/Environment Level of Independence: Independent        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         OT Problem List: Decreased activity tolerance;Impaired balance (sitting and/or standing);Decreased knowledge of use of DME or AE;Decreased knowledge of precautions;Pain      OT Treatment/Interventions:      OT Goals(Current goals can be found in the care plan section) Acute Rehab OT Goals Patient Stated Goal: "Go home" OT Goal Formulation: All assessment and education complete, DC therapy  OT Frequency:     Barriers to D/C:            Co-evaluation              AM-PAC OT "6 Clicks" Daily Activity     Outcome Measure Help from another person eating meals?: None Help from another person taking care of personal grooming?: None Help from another person toileting, which includes using toliet, bedpan, or urinal?: None Help from another person bathing (including washing, rinsing, drying)?: None Help from another person to put on and taking off regular upper body clothing?: None Help from another person to put on and taking off regular lower body clothing?: None 6 Click Score: 24   End of Session Equipment Utilized During Treatment: Back brace Nurse Communication: Mobility status;Other (comment)(cervical collar)  Activity Tolerance: Patient tolerated treatment well Patient left: in chair;with call bell/phone within reach  OT Visit Diagnosis: Unsteadiness on feet (R26.81);Other abnormalities of gait and mobility (R26.89);Muscle weakness (generalized) (M62.81);Pain Pain - part of body: (Back)                Time: 4540-9811 OT Time Calculation (min):  25 min Charges:  OT General Charges $OT Visit: 1 Visit OT Evaluation $OT Eval Low Complexity: 1 Low OT Treatments $Self Care/Home Management : 8-22 mins  Daneen Volcy MSOT, OTR/L Acute Rehab Pager: [phone] Office: [place] 05/19/2019, 8:28 AM

## 2019-06-03 ENCOUNTER — Telehealth: Payer: Self-pay | Admitting: Physical Medicine and Rehabilitation

## 2019-06-03 NOTE — Telephone Encounter (Signed)
Ok

## 2019-06-04 NOTE — Telephone Encounter (Signed)
Scheduled for 7/8 at 1345.

## 2019-06-10 ENCOUNTER — Other Ambulatory Visit: Payer: Self-pay

## 2019-06-10 ENCOUNTER — Ambulatory Visit (INDEPENDENT_AMBULATORY_CARE_PROVIDER_SITE_OTHER): Payer: Medicare Other | Admitting: Physical Medicine and Rehabilitation

## 2019-06-10 ENCOUNTER — Ambulatory Visit: Payer: Self-pay

## 2019-06-10 ENCOUNTER — Encounter: Payer: Self-pay | Admitting: Physical Medicine and Rehabilitation

## 2019-06-10 ENCOUNTER — Ambulatory Visit: Payer: Medicare Other | Admitting: Adult Health

## 2019-06-10 DIAGNOSIS — M7061 Trochanteric bursitis, right hip: Secondary | ICD-10-CM

## 2019-06-10 DIAGNOSIS — M7062 Trochanteric bursitis, left hip: Secondary | ICD-10-CM

## 2019-06-10 NOTE — Progress Notes (Signed)
 .  Numeric Pain Rating Scale and Functional Assessment Average Pain 10   In the last MONTH (on 0-10 scale) has pain interfered with the following?  1. General activity like being  able to carry out your everyday physical activities such as walking, climbing stairs, carrying groceries, or moving a chair?  Rating(7)    -Dye Allergies.  

## 2019-06-16 ENCOUNTER — Encounter: Payer: Self-pay | Admitting: Adult Health

## 2019-06-18 ENCOUNTER — Ambulatory Visit (INDEPENDENT_AMBULATORY_CARE_PROVIDER_SITE_OTHER): Payer: Medicare Other | Admitting: Adult Health

## 2019-06-18 ENCOUNTER — Other Ambulatory Visit: Payer: Self-pay

## 2019-06-18 ENCOUNTER — Encounter: Payer: Self-pay | Admitting: Adult Health

## 2019-06-18 VITALS — BP 109/62 | HR 62 | Temp 97.3°F | Ht 64.0 in | Wt 192.0 lb

## 2019-06-18 DIAGNOSIS — Z9989 Dependence on other enabling machines and devices: Secondary | ICD-10-CM | POA: Diagnosis not present

## 2019-06-18 DIAGNOSIS — G2581 Restless legs syndrome: Secondary | ICD-10-CM

## 2019-06-18 DIAGNOSIS — G4733 Obstructive sleep apnea (adult) (pediatric): Secondary | ICD-10-CM | POA: Diagnosis not present

## 2019-06-18 MED ORDER — PRAMIPEXOLE DIHYDROCHLORIDE 0.5 MG PO TABS: ORAL_TABLET | ORAL | 3 refills | Status: DC

## 2019-06-18 NOTE — Progress Notes (Signed)
PATIENT: Alexis Fox DOB: 08/16/1956  REASON FOR VISIT: follow up HISTORY FROM: patient  HISTORY OF PRESENT ILLNESS: Today 06/18/19:  Alexis Fox is a 63 year old female with a history of obstructive sleep apnea on CPAP and restless leg syndrome.  She returns today for follow-up.  Her CPAP download indicates that she use her machine 30 out of 30 days for compliance of 100%.  She use her machine greater than 4 hours 28 days for compliance of 93%.  On average she uses her machine 6 hours and 42 minutes.  Her residual AHI is 3.7 on 11 cm of water with EPR of 3.  Her leak in the 95th percentile is 31.3 L/min.  She currently has the nasal pillows but states that she would prefer the dream wear mask.  She states that there was some issue with her DME company that she cannot obtain this.  She states that her restless legs have been bothering her.  She states that she has not gotten much sleep in the last 2 nights.  She is currently taking Mirapex 0.5 mg at bedtime.  She is not taking half a tablet in the afternoon as instructed by Dr. Brett Fox.  She states that she thought she was supposed to take Klonopin in the afternoons.  She returns today for follow-up.  HISTORYNancy LONDIN Fox is a 63 y.o. female patient  , seen here on 09-15-2018  in a referral/ revisit from Dr. Laurann Fox for restless legs , having increased in intensity.    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  She has had restless leg symptoms for most of her life, she was last seen here 2014 after a sleep study had been obtained through [facility], at the time ordered by Alexis Fox and interpreted by Alexis Fox, she did have somewhat fragmented sleep at the time but there was no  significant apnea noted she did snore loudly but did not wake up from snoring, she did not have periodic limb movements at the time and her sleep architecture revealed a delayed REM sleep onset and lower REM sleep proportion which was not surprising as she was at the time taking clonazepam citalopram and pramipexole as well as bupropion.  Her current symptoms include memory loss, confusion, headaches, insomnia which has been present since 1998, daytime sleepiness, loud snoring and restless legs have resurfaced by liver for a while successfully treated.  She does not have a diagnosis of an anxiety disorder question if PTSD, chronic back pain breast cancer diagnosed in April 2017, lumpectomy 2017, fusion back surgery L5-S1 in May 2013, laparoscopic cholecystectomy in 2016 January, facet injections bilateral at L4-5.  Sleep habits are as follows: dinner time is at 7 Pm, and usually bedtime for her at 10-11 Pm, the bedroom is cool, quiet and dark. Husband comes later to bed, but doesn't wake up. Back and hip pain can interrupt her sleep. RLS set in about 4 PM on a very busy day- her legs are uncomfortable.  Her sleep is good while on medication ( takes Ambien - prn). One nocturia each night - goes back to sleep. Sleeps in prone position( due to pain) and one pillow to support her stomach- arms are  to her sides, head turned to her right.  Was a left sided sleeper before back and hip pain.  Rises at 7 or 11.30- variable each day depending on medication - if she gets up early she will walk the dog , returns to breakfast and falls asleep in her chair.     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  Sister has RLS , too and her son may have it.  Mother had sick sinus and pulmonary fibrosis. Father HTN, cholesterolemia, and astrocytoma  / malignant tumor of the brain.    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     REVIEW OF SYSTEMS: Out of a complete 14 system review of symptoms, the patient complains only of the following symptoms, and all other reviewed systems are negative.  See HPI  ALLERGIES: Allergies  Allergen Reactions   Turmeric Diarrhea    Severe diarrhea   Decadron [Dexamethasone]     Exacerbates restless leg    HOME MEDICATIONS: Outpatient Medications Prior to Visit  Medication Sig Dispense Refill   buPROPion (WELLBUTRIN XL) 150 MG 24 hr tablet Take 150 mg by mouth daily.  4   cetirizine (ZYRTEC) 10 MG tablet Take 10 mg by mouth daily.     Cholecalciferol (VITAMIN D3) 50 MCG (2000 UT) TABS Take 2,000 Units by mouth daily.      citalopram (CELEXA) 40 MG tablet Take 40 mg by mouth at bedtime.   3   clonazePAM (KLONOPIN) 1 MG tablet Take 1 mg by mouth at bedtime.      cycloSPORINE (RESTASIS) 0.05 % ophthalmic emulsion Place 1 drop into both eyes 2 (two) times daily.     docusate sodium (COLACE) 100 MG capsule Take 100 mg by mouth 2 (two) times daily.     Iron-Vitamin C (IRON 100/C PO) Take by mouth.     oxyCODONE (OXY IR/ROXICODONE) 5 MG immediate release tablet Take 1 tablet (5 mg total) by mouth every 3 (three) hours as needed for moderate pain ((score 4 to 6)). 56 tablet 0   pramipexole (MIRAPEX) 0.5 MG tablet Take 1/2 tab at 4 pm and 1 tab at night time prn. (Patient taking differently: Take 0.5 mg by mouth at bedtime. ) 135 tablet 3   Prenatal Vit-Fe Fumarate-FA (PRENATAL MULTIVITAMIN) TABS tablet Take 1 tablet by mouth daily at 12 noon.     zolpidem (AMBIEN) 5 MG tablet Take 5 mg by mouth at bedtime.   1    esomeprazole (NEXIUM) 20 MG capsule Take 20 mg by mouth daily at 12 noon.     methocarbamol (ROBAXIN) 500 MG tablet Take 1 tablet (500 mg total) by mouth every 8 (eight) hours as needed for muscle spasms. 60 tablet 1   Facility-Administered Medications Prior to Visit  Medication Dose Route Frequency Provider Last Rate Last Dose   methylPREDNISolone acetate (DEPO-MEDROL) injection 40 mg  40 mg Intra-articular Once Hilts, Michael, MD       sodium chloride flush (NS) 0.9 % injection 10 mL  10  mL Intracatheter PRN Nicholas Lose, MD   10 mL at 07/19/16 1434    PAST MEDICAL HISTORY: Past Medical History:  Diagnosis Date   Anxiety    Arthritis    knees   Breast cancer ([place])    Breast cancer of upper-outer quadrant of left female breast ([place]) 03/20/2016   C2 cervical fracture ([place])    10/22/18, following fall from ladder   Cholecystitis    Complication of anesthesia    BP "crashes" post op   Depression    Family history of brain cancer    Family history of breast cancer    Hot flashes    Personal history of radiation therapy    Restless leg syndrome    Sleep apnea 10/03/2018    PAST SURGICAL HISTORY: Past Surgical History:  Procedure Laterality Date   BREAST BIOPSY     BREAST LUMPECTOMY     BUNIONECTOMY     CHOLECYSTECTOMY N/A 10/04/2015   Procedure: LAPAROSCOPIC CHOLECYSTECTOMY WITH INTRAOPERATIVE CHOLANGIOGRAM;  Surgeon: Greer Pickerel, MD;  Location: [place];  Service: General;  Laterality: N/A;   ELBOW SURGERY     right for epicondylitis 2010    [place] -tarsal tunnel release   IR GENERIC HISTORICAL  10/26/2016   IR CV LINE INJECTION 10/26/2016 [facility]   LUMBAR DISC SURGERY  04/04/2012   PORT-A-CATH REMOVAL N/A 11/15/2016   Procedure: REMOVAL PORT-A-CATH;  Surgeon: Rolm Bookbinder, MD;  Location: [facility];  Service: General;  Laterality: N/A;   PORTACATH PLACEMENT Right 04/02/2016   Procedure: INSERTION PORT-A-CATH  WITH [place];  Surgeon: Rolm Bookbinder, MD;  Location: [place];  Service: General;  Laterality: Right;   RADIOACTIVE SEED GUIDED PARTIAL MASTECTOMY/AXILLARY SENTINEL NODE BIOPSY/AXILLARY NODE DISSECTION Left 09/12/2016   Procedure: LEFT BREAST SEED GUIDED LUMPECTOMY, LEFT AXILLARY SENTINEL NODE BIOPSY, LEFT SEED GUIDED AXILLARY NODE EXCISION( TARGETED AXILLARY DISSECTION), BLUE DYE INJECTION;  Surgeon: Rolm Bookbinder, MD;  Location: [facility];  Service: General;  Laterality: Left;   SPINAL FUSION  5/12   L5-S1   TARSAL TUNNEL RELEASE      FAMILY HISTORY: Family History  Problem Relation Age of Onset   Heart disease Mother    Pulmonary fibrosis Mother    Hypertension Mother    Cancer Father 23       astocytoma   Hypertension Brother    Lymphoma Maternal Aunt    Anesthesia problems Neg Hx     SOCIAL HISTORY: Social History   Socioeconomic History   Marital status: Married    Spouse name: Not on file   Number of children: 2   Years of education: Not on file   Highest education level: Not on file  Occupational History   Not on file  Social Needs   Financial resource strain: Not hard at all   Food insecurity    Worry: Never true    Inability: Never true   Transportation needs    Medical: No    Non-medical: No  Tobacco Use   Smoking status: Never Smoker   Smokeless tobacco: Never Used  Substance and Sexual Activity   Alcohol use: Yes    Alcohol/week: 1.0 standard drinks    Types: 1 Cans of beer per week    Comment: occasional   Drug use: No   Sexual activity: Yes    Birth control/protection: Post-menopausal  Lifestyle   Physical activity    Days per week: 0 days  Minutes per session: Not on file   Stress: Not at all  Relationships   Social connections    Talks on phone: Not on file    Gets together: Not on file    Attends religious service: Not on file    Active member of club or organization: Not on  file    Attends meetings of clubs or organizations: Not on file    Relationship status: Not on file   Intimate partner violence    Fear of current or ex partner: Not on file    Emotionally abused: Not on file    Physically abused: Not on file    Forced sexual activity: Not on file  Other Topics Concern   Not on file  Social History Narrative   Not on file      PHYSICAL EXAM  Vitals:   06/18/19 1443  BP: 109/62  Pulse: 62  Temp: (!) 97.3 F (36.3 C)  Weight: 192 lb (87.1 kg)  Height: 5\' 4"  (1.626 m)   Body mass index is 32.96 kg/m.  Generalized: Well developed, in no acute distress  CHEST: Lungs clear to auscultation bilaterally  Neurological examination  Mentation: Alert oriented to time, place, history taking. Follows all commands speech and language fluent Cranial nerve II-XII:  Extraocular movements were full, visual field were full on confrontational test. Motor: The motor testing reveals 5 over 5 strength of all 4 extremities. Good symmetric motor tone is noted throughout.  Sensory: Sensory testing is intact to soft touch on all 4 extremities. No evidence of extinction is noted.  Coordination: Cerebellar testing reveals good finger-nose-finger and heel-to-shin bilaterally.  Gait and station: Gait is normal.  Reflexes: Deep tendon reflexes are symmetric and normal bilaterally.  DIAGNOSTIC DATA (LABS, IMAGING, TESTING) - I reviewed patient records, labs, notes, testing and imaging myself where available.  Lab Results  Component Value Date   WBC 5.2 05/15/2019   HGB 12.8 05/15/2019   HCT 41.8 05/15/2019   MCV 99.5 05/15/2019   PLT 251 05/15/2019      Component Value Date/Time   NA 141 05/15/2019 1141   NA 139 05/27/2017 1126   K 4.2 05/15/2019 1141   K 4.0 05/27/2017 1126   CL 109 05/15/2019 1141   CO2 24 05/15/2019 1141   CO2 28 05/27/2017 1126   GLUCOSE 94 05/15/2019 1141   GLUCOSE 92 05/27/2017 1126   BUN 17 05/15/2019 1141   BUN 17.9 05/27/2017  1126   CREATININE 0.71 05/15/2019 1141   CREATININE 1.0 05/27/2017 1126   CALCIUM 9.5 05/15/2019 1141   CALCIUM 10.0 05/27/2017 1126   PROT 6.9 09/15/2018 1310   PROT 7.4 05/27/2017 1126   ALBUMIN 3.5 01/02/2018 1220   ALBUMIN 3.9 05/27/2017 1126   AST 15 01/02/2018 1220   AST 28 05/27/2017 1126   ALT 17 01/02/2018 1220   ALT 52 05/27/2017 1126   ALKPHOS 73 01/02/2018 1220   ALKPHOS 69 05/27/2017 1126   BILITOT 0.4 01/02/2018 1220   BILITOT 0.85 05/27/2017 1126   GFRNONAA >60 05/15/2019 1141   GFRAA >60 05/15/2019 1141   No results found for: CHOL, HDL, LDLCALC, LDLDIRECT, TRIG, CHOLHDL No results found for: HGBA1C No results found for: VITAMINB12 Lab Results  Component Value Date   TSH 2.680 09/15/2018      ASSESSMENT AND PLAN 63 y.o. year old female  has a past medical history of Anxiety, Arthritis, Breast cancer ([place]), Breast cancer of upper-outer quadrant of left female breast ([place]) (  03/20/2016), C2 cervical fracture ([place]), Cholecystitis, Complication of anesthesia, Depression, Family history of brain cancer, Family history of breast cancer, Hot flashes, Personal history of radiation therapy, Restless leg syndrome, and Sleep apnea (10/03/2018). here with:  1.  Obstructive sleep apnea on CPAP 2.  Restless leg syndrome  The patient CPAP download shows good compliance and treatment of her apnea.  We will reach out to her DME company to see if she can come in for mask refitting or if they can send her the DreamWear mask.  I have encouraged the patient to take Mirapex half a tablet in the afternoon and 1 tablet at bedtime.  She is advised that if her symptoms worsen or she develops new symptoms she should let us know.  She will follow-up in 6 months or sooner if needed.    Ward Givens, MSN, NP-C 06/18/2019, 3:18 PM [facility] [address], [place], [place] [address] [phone]

## 2019-06-18 NOTE — Patient Instructions (Addendum)
Your Plan:  Continue using CPAP nightly and greater than 4 hours each night Continue Mirapex 1 tablet at bedtime - try taking half tablet at 4 PM If your symptoms worsen or you develop new symptoms please let us know.   Thank you for coming to see [place] at [facility]. I hope we have been able to provide you high quality care today.  You may receive a patient satisfaction survey over the next few weeks. We would appreciate your feedback and comments so that we may continue to improve ourselves and the health of our patients.

## 2019-06-23 NOTE — Progress Notes (Signed)
I sent CM about: cpap.  This pt is with our [place] office. I have reached out to the CSR there to get in touch w. the pt ASAP. sy 06/23/19.

## 2019-06-25 DIAGNOSIS — H18413 Arcus senilis, bilateral: Secondary | ICD-10-CM | POA: Diagnosis not present

## 2019-06-25 DIAGNOSIS — H0102B Squamous blepharitis left eye, upper and lower eyelids: Secondary | ICD-10-CM | POA: Diagnosis not present

## 2019-06-25 DIAGNOSIS — H40033 Anatomical narrow angle, bilateral: Secondary | ICD-10-CM | POA: Diagnosis not present

## 2019-06-25 DIAGNOSIS — H11153 Pinguecula, bilateral: Secondary | ICD-10-CM | POA: Diagnosis not present

## 2019-06-25 DIAGNOSIS — H16223 Keratoconjunctivitis sicca, not specified as Sjogren's, bilateral: Secondary | ICD-10-CM | POA: Diagnosis not present

## 2019-06-25 DIAGNOSIS — H0102A Squamous blepharitis right eye, upper and lower eyelids: Secondary | ICD-10-CM | POA: Diagnosis not present

## 2019-06-25 DIAGNOSIS — H40013 Open angle with borderline findings, low risk, bilateral: Secondary | ICD-10-CM | POA: Diagnosis not present

## 2019-06-25 DIAGNOSIS — H2513 Age-related nuclear cataract, bilateral: Secondary | ICD-10-CM | POA: Diagnosis not present

## 2019-07-07 DIAGNOSIS — M4316 Spondylolisthesis, lumbar region: Secondary | ICD-10-CM | POA: Diagnosis not present

## 2019-07-09 DIAGNOSIS — L255 Unspecified contact dermatitis due to plants, except food: Secondary | ICD-10-CM | POA: Diagnosis not present

## 2019-07-09 DIAGNOSIS — G2581 Restless legs syndrome: Secondary | ICD-10-CM | POA: Diagnosis not present

## 2019-07-14 ENCOUNTER — Ambulatory Visit (INDEPENDENT_AMBULATORY_CARE_PROVIDER_SITE_OTHER): Payer: Medicare Other | Admitting: Orthopaedic Surgery

## 2019-07-14 ENCOUNTER — Encounter: Payer: Self-pay | Admitting: Orthopaedic Surgery

## 2019-07-14 ENCOUNTER — Other Ambulatory Visit: Payer: Self-pay

## 2019-07-14 DIAGNOSIS — M7062 Trochanteric bursitis, left hip: Secondary | ICD-10-CM | POA: Diagnosis not present

## 2019-07-14 NOTE — Progress Notes (Signed)
Office Visit Note   Patient: Alexis Fox           Date of Birth: 1956-07-14           MRN: [id number] Visit Date: 07/14/2019              Requested by: [name], MD [address] [place] [address],  [place] [address] PCP: [name], MD   Assessment & Plan: Visit Diagnoses:  1. Trochanteric bursitis, left hip     Plan: Levina is having persistent pain on the lateral aspect of her left hip.  She has had several cortisone injections by Dr. Ernestina Patches with little if any relief.  She is also had a recent lumbar fusion at L4-5 that did not alleviate her hip pain.  She had an MRI scan of her pelvis in October 2019 demonstrating a small amount of fluid over the greater trochanter of the left hip and partial tears of the gluteus minimus and edema within the distal left gluteus medius muscle.  I am not sure if these are significant.  There was no evidence of any degenerative change of her hip or any other abnormality on the left side.  Gustavia has been through a course of physical therapy, injections, over-the-counter medicines Voltaren gel etc.  She is on disability and is having considerable pain over that left hip.  I wonder if she would be a candidate for an arthroscopic evaluation and will refer to Dr. Aretha Parrot at [place] Instructions: Return if symptoms worsen or fail to improve.   Orders:  No orders of the defined types were placed in this encounter.  No orders of the defined types were placed in this encounter.     Procedures: No procedures performed   Clinical Data: No additional findings.   Subjective: Chief Complaint  Patient presents with  . Left Hip - Pain  . Right Hip - Pain  Patient presents today for chronic bilateral hip. Patient states that her left hip is worse than the left. She has been seeing Dr.Newton for her hip injections. She had her left hip injected with Dr.Newton on 06/10/2019, and her right hip injected with Dr.Hilts on 03/30/2019. She  said that the injections help for a little while. She is unable to walk the long for long periods due to pain. She does not take anything for pain.  Has had an L4-5 fusion without any significant relief of the left hip pain.  She has had cortisone as mentioned with minimal relief.  No referred pain.  No groin pain and no evidence of arthritis of the left hip by MRI scan or plain film.  Has gained about 40 pounds over the last 5 months  HPI  Review of Systems   Objective: Vital Signs: BP 129/74   Pulse 80   Ht 5\' 4"  (1.626 m)   Wt 190 lb (86.2 kg)   BMI 32.61 kg/m   Physical Exam Constitutional:      Appearance: She is well-developed.  Eyes:     Pupils: Pupils are equal, round, and reactive to light.  Pulmonary:     Effort: Pulmonary effort is normal.  Skin:    General: Skin is warm and dry.  Neurological:     Mental Status: She is alert and oriented to person, place, and time.  Psychiatric:        Behavior: Behavior normal.     Ortho Exam awake alert and oriented x3.  There is a  very minimal limp referable to the lateral aspect of her left hip.  Does have some tenderness just superior to the greater trochanter.  No masses.  Straight leg raise negative.  Painless range of motion of her left hip.  No pain along the IT band and no sacral pain  Specialty Comments:  No specialty comments available.  Imaging: No results found.   PMFS History: Patient Active Problem List   Diagnosis Date Noted  . Trochanteric bursitis, left hip 07/14/2019  . S/P lumbar fusion 05/18/2019  . OSA (obstructive sleep apnea) 10/28/2018  . Pain managed using patient-controlled analgesia (PCA) 10/25/2018  . Fall 10/24/2018  . DDD (degenerative disc disease), lumbosacral 09/15/2018  . Fatigue due to depression 09/15/2018  . Chemotherapy-induced neuropathy ([place]) 09/15/2018  . Central line complication 26/71/2458  . Genetic testing 05/15/2016  . Family history of breast cancer   . Family history  of brain cancer   . Breast cancer of upper-outer quadrant of left female breast ([place]) 03/20/2016  . Transaminitis 10/03/2015  . Symptomatic cholelithiasis 10/03/2015  . RUQ abdominal pain   . Depression 06/24/2013  . RLS (restless legs syndrome) 06/24/2013   Past Medical History:  Diagnosis Date  . Anxiety   . Arthritis    knees  . Breast cancer ([place])   . Breast cancer of upper-outer quadrant of left female breast ([place]) 03/20/2016  . C2 cervical fracture ([place])    10/22/18, following fall from ladder  . Cholecystitis   . Complication of anesthesia    BP "crashes" post op  . Depression   . Family history of brain cancer   . Family history of breast cancer   . Hot flashes   . Personal history of radiation therapy   . Restless leg syndrome   . Sleep apnea 10/03/2018    Family History  Problem Relation Age of Onset  . Heart disease Mother   . Pulmonary fibrosis Mother   . Hypertension Mother   . Cancer Father 40       astocytoma  . Hypertension Brother   . Lymphoma Maternal Aunt   . Anesthesia problems Neg Hx     Past Surgical History:  Procedure Laterality Date  . BREAST BIOPSY    . BREAST LUMPECTOMY    . BUNIONECTOMY    . CHOLECYSTECTOMY N/A 10/04/2015   Procedure: LAPAROSCOPIC CHOLECYSTECTOMY WITH INTRAOPERATIVE CHOLANGIOGRAM;  Surgeon: Greer Pickerel, MD;  Location: [place];  Service: General;  Laterality: N/A;  . ELBOW SURGERY     right for epicondylitis 2010   . FOOT SURGERY     1983 -tarsal tunnel release  . IR GENERIC HISTORICAL  10/26/2016   IR CV LINE INJECTION 10/26/2016 [facility]INTER[facility]  . LUMBAR DISC SURGERY  04/04/2012  . PORT-A-CATH REMOVAL N/A 11/15/2016   Procedure: REMOVAL PORT-A-CATH;  Surgeon: Rolm Bookbinder, MD;  Location: [facility];  Service: General;  Laterality: N/A;  . PORTACATH PLACEMENT Right 04/02/2016   Procedure: INSERTION PORT-A-CATH WITH [place];  Surgeon: Rolm Bookbinder, MD;  Location: [facility];  Service:  General;  Laterality: Right;  . RADIOACTIVE SEED GUIDED PARTIAL MASTECTOMY/AXILLARY SENTINEL NODE BIOPSY/AXILLARY NODE DISSECTION Left 09/12/2016   Procedure: LEFT BREAST SEED GUIDED LUMPECTOMY, LEFT AXILLARY SENTINEL NODE BIOPSY, LEFT SEED GUIDED AXILLARY NODE EXCISION( TARGETED AXILLARY DISSECTION), BLUE DYE INJECTION;  Surgeon: Rolm Bookbinder, MD;  Location: [place];  Service: General;  Laterality: Left;  . SPINAL FUSION  5/12   L5-S1  . TARSAL TUNNEL RELEASE  Social History   Occupational History  . Not on file  Tobacco Use  . Smoking status: Never Smoker  . Smokeless tobacco: Never Used  Substance and Sexual Activity  . Alcohol use: Yes    Alcohol/week: 1.0 standard drinks    Types: 1 Cans of beer per week    Comment: occasional  . Drug use: No  . Sexual activity: Yes    Birth control/protection: Post-menopausal

## 2019-07-15 ENCOUNTER — Other Ambulatory Visit: Payer: Self-pay | Admitting: *Deleted

## 2019-07-15 ENCOUNTER — Ambulatory Visit (INDEPENDENT_AMBULATORY_CARE_PROVIDER_SITE_OTHER): Payer: Medicare Other | Admitting: Women's Health

## 2019-07-15 ENCOUNTER — Encounter: Payer: Self-pay | Admitting: Women's Health

## 2019-07-15 VITALS — BP 118/74 | Ht 64.0 in | Wt 196.0 lb

## 2019-07-15 DIAGNOSIS — Z9289 Personal history of other medical treatment: Secondary | ICD-10-CM | POA: Diagnosis not present

## 2019-07-15 DIAGNOSIS — M7062 Trochanteric bursitis, left hip: Secondary | ICD-10-CM

## 2019-07-15 DIAGNOSIS — E2839 Other primary ovarian failure: Secondary | ICD-10-CM

## 2019-07-15 DIAGNOSIS — L292 Pruritus vulvae: Secondary | ICD-10-CM | POA: Diagnosis not present

## 2019-07-15 DIAGNOSIS — R102 Pelvic and perineal pain unspecified side: Secondary | ICD-10-CM

## 2019-07-15 DIAGNOSIS — Z01419 Encounter for gynecological examination (general) (routine) without abnormal findings: Secondary | ICD-10-CM | POA: Diagnosis not present

## 2019-07-15 DIAGNOSIS — N7689 Other specified inflammation of vagina and vulva: Secondary | ICD-10-CM | POA: Diagnosis not present

## 2019-07-15 DIAGNOSIS — Z13820 Encounter for screening for osteoporosis: Secondary | ICD-10-CM

## 2019-07-15 MED ORDER — NYSTATIN-TRIAMCINOLONE 100000-0.1 UNIT/GM-% EX OINT: 1.0000 "application " | TOPICAL_OINTMENT | Freq: Two times a day (BID) | CUTANEOUS | 1 refills | Status: DC

## 2019-07-15 NOTE — Progress Notes (Signed)
Alexis MONSIVAIS 1956-07-15 [id number]    History:    Presents for breast and pelvic exam.  States has had a rough year had a traumatic fall off of a ladder fractured cervical disc had to wear hard collar for 7 months doing better but continues to have leg pain making walking difficult.  Also had lumbar  back surgery.  2017 left breast cancer triple negative, BRCA negative radiation, chemotherapy and lumpectomy.  On disability due to neurologic changes after chemotherapy.  Normal Pap history.  2016 normal DEXA.  Not sexually active, no desire, back pain and vaginal dryness.  Past medical history, past surgical history, family history and social history were all reviewed and documented in the EPIC chart.  Retired Designer, jewellery.  2 children both doing well.  2016 cholecystectomy.  ROS:  A ROS was performed and pertinent positives and negatives are included.  Exam:  Vitals:   07/15/19 1201  BP: 118/74  Weight: 196 lb (88.9 kg)  Height: 5' 4"  (1.626 m)   Body mass index is 33.64 kg/m.   General appearance:  Normal Thyroid:  Symmetrical, normal in size, without palpable masses or nodularity. Respiratory  Auscultation:  Clear without wheezing or rhonchi Cardiovascular  Auscultation:  Regular rate, without rubs, murmurs or gallops  Edema/varicosities:  Not grossly evident Abdominal  Soft,nontender, without masses, guarding or rebound.  Liver/spleen:  No organomegaly noted  Hernia:  None appreciated  Skin  Inspection:  Grossly normal   Breasts: Examined lying and sitting.     Right: Without masses, retractions, discharge or axillary adenopathy.     Left: Without masses, retractions, discharge or axillary adenopathy. Gentitourinary   Inguinal/mons:  Normal without inguinal adenopathy  External genitalia:  Normal  BUS/Urethra/Skene's glands:  Normal  Vagina: Atrophic   Cervix:  Normal  Uterus:   normal in size, shape and contour.  Midline and mobile  Adnexa/parametria:      Rt: Without masses or tenderness.   Lt: Without masses or tenderness.  Anus and perineum: Normal  Digital rectal exam: Normal sphincter tone without palpated masses or tenderness  Assessment/Plan:  63 y.o. MWF G3 P2 for breast and pelvic exam.  2017 left breast cancer triple negative, lumpectomy, radiation and chemotherapy Postmenopausal with no bleeding Vaginal atrophy Obesity Restless leg, anxiety/depression primary care manages labs and meds  Plan: SBEs, continue annual screening mammogram, exercise as able reports mobility limited.  Calcium rich foods, continue vitamin D supplement.  Aware of need to decrease calorie/carbs for weight loss.  Currently on steroid Dosepak for poison ivy.  Mycolog ointment small amount external labia for occasional vaginal irritation with burning sensation.  Loose clothing encouraged.  Pap normal 2019, new screening guidelines reviewed.  Repeat DEXA in the next year.  Aware of importance of weightbearing/balance type exercise, home safety and fall prevention discussed.    BERNIS STECHER [facility], 12:10 PM 07/15/2019

## 2019-07-15 NOTE — Patient Instructions (Signed)

## 2019-07-15 NOTE — Progress Notes (Signed)
amb refer

## 2019-07-20 DIAGNOSIS — M7062 Trochanteric bursitis, left hip: Secondary | ICD-10-CM | POA: Diagnosis not present

## 2019-07-20 DIAGNOSIS — M7061 Trochanteric bursitis, right hip: Secondary | ICD-10-CM | POA: Diagnosis not present

## 2019-07-20 MED ORDER — BUPIVACAINE HCL 0.25 % IJ SOLN
4.0000 mL | INTRAMUSCULAR | Status: AC | PRN
  Administered 2019-07-20: 4 mL via INTRA_ARTICULAR

## 2019-07-20 MED ORDER — TRIAMCINOLONE ACETONIDE 40 MG/ML IJ SUSP
60.0000 mg | INTRAMUSCULAR | Status: AC | PRN
  Administered 2019-07-20: 60 mg via INTRA_ARTICULAR

## 2019-07-20 MED ORDER — TRIAMCINOLONE ACETONIDE 40 MG/ML IJ SUSP
60.0000 mg | INTRAMUSCULAR | Status: AC | PRN
  Administered 2019-07-20: 13:00:00 60 mg via INTRA_ARTICULAR

## 2019-07-20 NOTE — Progress Notes (Signed)
Alexis Fox - 63 y.o. female MRN [id number]  Date of birth: Apr 11, 1956  Office Visit Note: Visit Date: 06/10/2019 PCP: [name], Fox Referred by: [name], Fox  Subjective: Chief Complaint  Patient presents with  . Left Hip - Pain   HPI:  Alexis Fox is a 63 y.o. female who comes in today For planned repeat left greater trochanteric bursa injection under fluoroscopic guidance.  Fluoroscopic guidance was utilized in the past to the body habitus and she got really great relief with the injection on December 15, 2018.  She reports last injection helped quite a bit.  She reports laying on the right hip makes the right side worse as well.  She gets pain in the left with walking and laying on the left side.  She rates her pain as a 10 out of 10.  Her case is complicated by fairly recent lumbar fusion at L4-5.  She has had cervical issues as well.  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  She follows with Dr. Joni Fears.  Prior injections completed in their office without fluoroscopic guidance.  Very beneficial.  We will go ahead and repeat bilateral greater trochanteric injections today.  Depending on relief she will follow-up with Dr. Durward Fortes.  ROS Otherwise per HPI.  Assessment & Plan: Visit Diagnoses:  1. Greater trochanteric bursitis, left   2. Greater trochanteric bursitis, right     Plan: No additional findings.   Meds & Orders: No orders of the defined types were placed in this encounter.   Orders Placed This Encounter  Procedures  . Large Joint Inj  . XR C-ARM NO REPORT    Follow-up: Return if symptoms worsen or fail to improve.   Procedures: Large Joint Inj: bilateral greater trochanter on 07/20/2019 1:05 PM Indications: pain and diagnostic evaluation Details: 22 G 3.5 in needle, fluoroscopy-guided lateral approach  Arthrogram: No  Medications (Right): 60 mg  triamcinolone acetonide 40 MG/ML; 4 mL bupivacaine 0.25 % Medications (Left): 60 mg triamcinolone acetonide 40 MG/ML; 4 mL bupivacaine 0.25 % Outcome: tolerated well, no immediate complications  There was excellent flow of contrast outlined the greater trochanteric bursa without vascular uptake. Procedure, treatment alternatives, risks and benefits explained, specific risks discussed. Consent was given by the patient. Immediately prior to procedure a time out was called to verify the correct patient, procedure, equipment, support staff and site/side marked as required. Patient was prepped and draped in the usual sterile fashion.      No notes on file   Clinical History: IMPRESSION:  Nerve conduction studies done on both lower extremities were within normal limits. No evidence of a peripheral neuropathy is seen. A small fiber neuropathy can be missed by standard nerve conduction studies however, clinical correlation is required. EMG evaluation of the right lower extremity was within normal limits, no evidence of an overlying lumbosacral radiculopathy was seen.  Alexis Fox 10/13/2018 9:25 AM  --------- MRI pelvis IMPRESSION: 1. Partial tears of the bilateral gluteus minimus tendons and strain of the distal left gluteus medius muscle. 2. Mild left greater trochanteric bursitis. 3. No acute osseous abnormality or evidence of metastatic disease. 4. Mild right hip osteoarthritis.   Electronically Signed   By: Titus Dubin M.D.   On: 09/11/2018 15:00     Objective:  VS:  HT:    WT:   BMI:     BP:  HR: bpm  TEMP: ( )  RESP:  Physical Exam Musculoskeletal:     Comments: Patient appears to be moving better today than in the past.  She still has pain over the greater trochanter with palpation left more than right.  No pain with hip rotation.     Ortho Exam Imaging: No results found.

## 2019-07-27 ENCOUNTER — Telehealth: Payer: Self-pay | Admitting: *Deleted

## 2019-07-27 MED ORDER — NYSTATIN-TRIAMCINOLONE 100000-0.1 UNIT/GM-% EX OINT: 1.0000 "application " | TOPICAL_OINTMENT | Freq: Two times a day (BID) | CUTANEOUS | 1 refills | Status: DC

## 2019-07-27 NOTE — Telephone Encounter (Signed)
Patient was seen on 07/15/19 reports pharmacy never received Mycolog ointment , [facility] said [facility]. Was told to re-sent Rx, this was done.

## 2019-08-04 DIAGNOSIS — M25552 Pain in left hip: Secondary | ICD-10-CM | POA: Diagnosis not present

## 2019-08-12 DIAGNOSIS — M25552 Pain in left hip: Secondary | ICD-10-CM | POA: Diagnosis not present

## 2019-08-17 DIAGNOSIS — M1632 Unilateral osteoarthritis resulting from hip dysplasia, left hip: Secondary | ICD-10-CM | POA: Diagnosis not present

## 2019-08-18 ENCOUNTER — Telehealth: Payer: Self-pay | Admitting: Orthopaedic Surgery

## 2019-08-18 NOTE — Telephone Encounter (Signed)
Patient went to doctor at [facility]. He recommended a THR. Patient would like to talk to you about THR. Please call to advise.

## 2019-08-18 NOTE — Telephone Encounter (Signed)
Please remind me to call Thurs of Fri-can we get the office note from Dr Aretha Parrot at [facility]?

## 2019-08-18 NOTE — Telephone Encounter (Signed)
Please see below.

## 2019-08-19 NOTE — Telephone Encounter (Signed)
Called and have requested last office notes to be faxed to [phone].

## 2019-08-20 ENCOUNTER — Other Ambulatory Visit: Payer: Self-pay | Admitting: Orthopaedic Surgery

## 2019-08-20 DIAGNOSIS — M25552 Pain in left hip: Secondary | ICD-10-CM

## 2019-08-20 NOTE — Telephone Encounter (Signed)
Order has been placed.

## 2019-08-20 NOTE — Telephone Encounter (Signed)
Discussed in office- will refer to Dr Ninfa Linden for left [facility]

## 2019-08-20 NOTE — Telephone Encounter (Signed)
Notes have been faxed

## 2019-08-27 ENCOUNTER — Ambulatory Visit: Payer: Medicare Other | Admitting: Orthopaedic Surgery

## 2019-08-31 ENCOUNTER — Ambulatory Visit: Payer: Medicare Other | Admitting: Orthopaedic Surgery

## 2019-09-01 ENCOUNTER — Encounter: Payer: Self-pay | Admitting: Orthopaedic Surgery

## 2019-09-01 ENCOUNTER — Other Ambulatory Visit: Payer: Self-pay

## 2019-09-01 ENCOUNTER — Ambulatory Visit (INDEPENDENT_AMBULATORY_CARE_PROVIDER_SITE_OTHER): Payer: Medicare Other | Admitting: Orthopaedic Surgery

## 2019-09-01 VITALS — BP 135/70 | HR 75 | Ht 64.0 in | Wt 196.0 lb

## 2019-09-01 DIAGNOSIS — M25551 Pain in right hip: Secondary | ICD-10-CM

## 2019-09-01 DIAGNOSIS — M25552 Pain in left hip: Secondary | ICD-10-CM

## 2019-09-01 DIAGNOSIS — M1611 Unilateral primary osteoarthritis, right hip: Secondary | ICD-10-CM

## 2019-09-01 NOTE — Progress Notes (Signed)
Office Visit Note   Patient: Alexis Fox           Date of Birth: 12/29/1955           MRN: [id number] Visit Date: 09/01/2019              Requested by: [name], MD [address] [place],  [place] [address] PCP: [name], MD   Assessment & Plan: Visit Diagnoses:  1. Bilateral hip pain   2. Unilateral primary osteoarthritis, right hip     Plan: Big question is whether her present pain is related to her right hip osteoarthritis or referred pain from her back either SI joints or lumbar spine.  We talked about her problem for over 30 minutes 50% of the time in counseling and I think it is worth a step approach with initial SI joint injections then consider either repeat MRI scan of the lumbar spine or selective injections of L2-3 or L3-4 facets.  I did review her pre-and post L4-5 fusion films with her that she had on her phone..  Does appear to have degenerative disc changes at L2-3 and L3-4 above the areas of fusion these could be causing to have some bilateral hip pain  Follow-Up Instructions: Return After SI joint injections.   Orders:  Orders Placed This Encounter  Procedures  . Ambulatory referral to Physical Medicine Rehab   No orders of the defined types were placed in this encounter.     Procedures: No procedures performed   Clinical Data: No additional findings.   Subjective: Chief Complaint  Patient presents with  . Left Hip - Pain  . Right Hip - Pain  Patient presents today for follow up on her left hip pain. She was referred to [facility] for left hip total arthroplasty, per Dr.Stubbs recommendation. Patient states that she does not want to do that and wants to further discuss her options. She also wants to discuss her right hip pain. She said that her hip hurts on both sides, but the left currently hurts worse. She is able to squat all the way down to the floor with no pain, but unable to walk without pain.  Phenicia is been having  problem with her back and her bilateral hips on a chronic basis.  She has been followed by Dr. Ronnald Ramp and has had a prior L4-5 and L5-S1 fusion.  She has had some problems with her back and sacral region but also bilateral hip pain.  She was initially having more trouble on the right.  She has had CT of her pelvis demonstrating some degenerative changes.  She has not responded to cortisone injection over the greater trochanter of her right hip or intra-articular injection.  I referred her to Dr. Aretha Parrot at [facility] who thought she would be a candidate for a hip replacement.  I had referred her to Dr. Ninfa Linden for that consideration but she want to come back and talk about other possibilities rather than hip replacement.  She does have bilateral lateral hip pain and oftentimes will be worse on the left than the right.  CT scan did not show any significant hip pathology on the left other than some partial tearing of the gluteal muscles.  Catheleen is researched her problem and thinks it might be a problem with the SI joints or possibly since an area of more proximal to her fusions at L2-3 or L3-4.  She no longer has the distal leg pain after her  L4-5 fusion  HPI  Review of Systems  Constitutional: Negative for fatigue.  HENT: Negative for ear pain.   Eyes: Negative for pain.  Respiratory: Negative for shortness of breath.   Cardiovascular: Negative for leg swelling.  Gastrointestinal: Negative for constipation and diarrhea.  Endocrine: Positive for heat intolerance. Negative for cold intolerance.  Genitourinary: Negative for difficulty urinating.  Musculoskeletal: Negative for joint swelling.  Skin: Negative for rash.  Allergic/Immunologic: Negative for food allergies.  Neurological: Negative for weakness.  Hematological: Does not bruise/bleed easily.  Psychiatric/Behavioral: Negative for sleep disturbance.     Objective: Vital Signs: BP 135/70   Pulse 75   Ht 5\' 4"  (1.626 m)   Wt 196 lb (88.9 kg)    BMI 33.64 kg/m   Physical Exam Constitutional:      Appearance: She is well-developed.  Eyes:     Pupils: Pupils are equal, round, and reactive to light.  Pulmonary:     Effort: Pulmonary effort is normal.  Skin:    General: Skin is warm and dry.  Neurological:     Mental Status: She is alert and oriented to person, place, and time.  Psychiatric:        Behavior: Behavior normal.     Ortho Exam awake alert and oriented x3.  Comfortable sitting.  Actually has excellent range of motion of both of her hips with internal and external rotation.  Straight leg raise negative.  Figure-of-four test is negative bilaterally.  She does have some areas of tenderness at the lumbosacral junction and the sacrum but mild.  Specialty Comments:  No specialty comments available.  Imaging: No results found.   PMFS History: Patient Active Problem List   Diagnosis Date Noted  . Unilateral primary osteoarthritis, right hip 09/01/2019  . Trochanteric bursitis, left hip 07/14/2019  . S/P lumbar fusion 05/18/2019  . OSA (obstructive sleep apnea) 10/28/2018  . Pain managed using patient-controlled analgesia (PCA) 10/25/2018  . Fall 10/24/2018  . DDD (degenerative disc disease), lumbosacral 09/15/2018  . Fatigue due to depression 09/15/2018  . Chemotherapy-induced neuropathy ([place]) 09/15/2018  . Central line complication [id number]  . Genetic testing 05/15/2016  . Family history of breast cancer   . Family history of brain cancer   . Breast cancer of upper-outer quadrant of left female breast ([place]) 03/20/2016  . Transaminitis 10/03/2015  . Symptomatic cholelithiasis 10/03/2015  . RUQ abdominal pain   . Depression 06/24/2013  . RLS (restless legs syndrome) 06/24/2013   Past Medical History:  Diagnosis Date  . Anxiety   . Arthritis    knees  . Breast cancer ([place])   . Breast cancer of upper-outer quadrant of left female breast ([place]) 03/20/2016  . C2 cervical fracture ([place])    10/22/18,  following fall from ladder  . Cholecystitis   . Complication of anesthesia    BP "crashes" post op  . Depression   . Family history of brain cancer   . Family history of breast cancer   . Hot flashes   . Personal history of radiation therapy   . Restless leg syndrome   . Sleep apnea 10/03/2018    Family History  Problem Relation Age of Onset  . Heart disease Mother   . Pulmonary fibrosis Mother   . Hypertension Mother   . Cancer Father 28       astocytoma  . Hypertension Brother   . Lymphoma Maternal Aunt   . Anesthesia problems Neg Hx     Past Surgical History:  Procedure Laterality Date  . BREAST BIOPSY    . BREAST LUMPECTOMY    . BUNIONECTOMY    . CHOLECYSTECTOMY N/A 10/04/2015   Procedure: LAPAROSCOPIC CHOLECYSTECTOMY WITH INTRAOPERATIVE CHOLANGIOGRAM;  Surgeon: Greer Pickerel, MD;  Location: [facility];  Service: General;  Laterality: N/A;  . ELBOW SURGERY     right for epicondylitis 2010   . FOOT SURGERY     1983 -tarsal tunnel release  . IR GENERIC HISTORICAL  10/26/2016   IR CV LINE INJECTION 10/26/2016 [facility]-INTERV RAD  . LUMBAR DISC SURGERY  04/04/2012  . PORT-A-CATH REMOVAL N/A 11/15/2016   Procedure: REMOVAL PORT-A-CATH;  Surgeon: Rolm Bookbinder, MD;  Location: [facility];  Service: General;  Laterality: N/A;  . PORTACATH PLACEMENT Right 04/02/2016   Procedure: INSERTION PORT-A-CATH WITH [place];  Surgeon: Rolm Bookbinder, MD;  Location: [facility];  Service: General;  Laterality: Right;  . RADIOACTIVE SEED GUIDED PARTIAL MASTECTOMY/AXILLARY SENTINEL NODE BIOPSY/AXILLARY NODE DISSECTION Left 09/12/2016   Procedure: LEFT BREAST SEED GUIDED LUMPECTOMY, LEFT AXILLARY SENTINEL NODE BIOPSY, LEFT SEED GUIDED AXILLARY NODE EXCISION( TARGETED AXILLARY DISSECTION), BLUE DYE INJECTION;  Surgeon: Rolm Bookbinder, MD;  Location: [facility];  Service: General;  Laterality: Left;  . SPINAL FUSION  5/12   L5-S1  . SPINAL FUSION  05/18/2019    L4-5  . TARSAL TUNNEL RELEASE     Social History   Occupational History  . Not on file  Tobacco Use  . Smoking status: Never Smoker  . Smokeless tobacco: Never Used  Substance and Sexual Activity  . Alcohol use: Yes    Alcohol/week: 1.0 standard drinks    Types: 1 Cans of beer per week    Comment: occasional  . Drug use: No  . Sexual activity: Not Currently    Birth control/protection: Post-menopausal

## 2019-09-11 ENCOUNTER — Telehealth: Payer: Self-pay | Admitting: Orthopaedic Surgery

## 2019-09-11 ENCOUNTER — Other Ambulatory Visit: Payer: Self-pay | Admitting: Orthopaedic Surgery

## 2019-09-11 MED ORDER — OXYCODONE-ACETAMINOPHEN 5-325 MG PO TABS: 1.0000 | ORAL_TABLET | Freq: Three times a day (TID) | ORAL | 0 refills | Status: DC | PRN

## 2019-09-11 NOTE — Telephone Encounter (Signed)
Patient advised prescription sent to the pharmacy.  

## 2019-09-11 NOTE — Telephone Encounter (Signed)
Patient called requesting prescription of Oxycodone [facility] [place] [facility] [place].  Patient states she is scheduled for injections with Dr. Ernestina Patches on 09/24/19, but is experiencing a lot of pain.  Patient was informed that Dr. Durward Fortes was not in the office.

## 2019-09-11 NOTE — Telephone Encounter (Signed)
Sent to pharmacy 

## 2019-09-15 DIAGNOSIS — M25551 Pain in right hip: Secondary | ICD-10-CM | POA: Diagnosis not present

## 2019-09-15 DIAGNOSIS — G2581 Restless legs syndrome: Secondary | ICD-10-CM | POA: Diagnosis not present

## 2019-09-15 DIAGNOSIS — F325 Major depressive disorder, single episode, in full remission: Secondary | ICD-10-CM | POA: Diagnosis not present

## 2019-09-15 DIAGNOSIS — G4733 Obstructive sleep apnea (adult) (pediatric): Secondary | ICD-10-CM | POA: Diagnosis not present

## 2019-09-15 DIAGNOSIS — Z1389 Encounter for screening for other disorder: Secondary | ICD-10-CM | POA: Diagnosis not present

## 2019-09-15 DIAGNOSIS — Z0000 Encounter for general adult medical examination without abnormal findings: Secondary | ICD-10-CM | POA: Diagnosis not present

## 2019-09-15 DIAGNOSIS — M25552 Pain in left hip: Secondary | ICD-10-CM | POA: Diagnosis not present

## 2019-09-24 ENCOUNTER — Ambulatory Visit: Payer: Self-pay

## 2019-09-24 ENCOUNTER — Ambulatory Visit (INDEPENDENT_AMBULATORY_CARE_PROVIDER_SITE_OTHER): Payer: Medicare Other | Admitting: Physical Medicine and Rehabilitation

## 2019-09-24 ENCOUNTER — Encounter: Payer: Self-pay | Admitting: Physical Medicine and Rehabilitation

## 2019-09-24 ENCOUNTER — Other Ambulatory Visit: Payer: Self-pay

## 2019-09-24 DIAGNOSIS — M461 Sacroiliitis, not elsewhere classified: Secondary | ICD-10-CM

## 2019-09-24 NOTE — Progress Notes (Signed)
 .  Numeric Pain Rating Scale and Functional Assessment Average Pain 6   In the last MONTH (on 0-10 scale) has pain interfered with the following?  1. General activity like being  able to carry out your everyday physical activities such as walking, climbing stairs, carrying groceries, or moving a chair?  Rating(6)   -Dye Allergies.  

## 2019-09-24 NOTE — Progress Notes (Signed)
   Alexis Fox - 63 y.o. female MRN [id number]  Date of birth: 1956/02/28  Office Visit Note: Visit Date: 09/24/2019 PCP: [name], MD Referred by: [name], MD  Subjective: Chief Complaint  Patient presents with  . Lower Back - Pain  . Right Thigh - Pain  . Left Thigh - Pain   HPI:  Alexis Fox is a 63 y.o. female who comes in today For planned bilateral sacroiliac joint injections with fluoroscopic guidance as requested by Dr. Joni Fears.  Patient's had prior lumbar fusion at L4-5.  She has had some known arthritis of the hips.  She will continue to follow-up with Dr. Durward Fortes concerning the next step depending on relief with the injections.  ROS Otherwise per HPI.  Assessment & Plan: Visit Diagnoses:  1. Sacroiliitis ([place])     Plan: No additional findings.   Meds & Orders: No orders of the defined types were placed in this encounter.   Orders Placed This Encounter  Procedures  . Sacroiliac Joint Inj  . XR C-ARM NO REPORT    Follow-up: No follow-ups on file.   Procedures: Sacroiliac Joint Inj on 09/24/2019 3:18 PM Indications: pain and diagnostic evaluation Details: 22 G 3.5 in needle, fluoroscopy-guided posterior approach Medications (Right): 2 mL bupivacaine 0.5 %; 40 mg triamcinolone acetonide 40 MG/ML Medications (Left): 2 mL bupivacaine 0.5 %; 40 mg triamcinolone acetonide 40 MG/ML Outcome: tolerated well, no immediate complications  There was excellent flow of contrast producing a partial arthrogram of the sacroiliac joint.  Procedure, treatment alternatives, risks and benefits explained, specific risks discussed. Consent was given by the patient. Immediately prior to procedure a time out was called to verify the correct patient, procedure, equipment, support staff and site/side marked as required. Patient was prepped and draped in the usual sterile fashion.      No notes on file   Clinical History: IMPRESSION:  Nerve conduction studies  done on both lower extremities were within normal limits. No evidence of a peripheral neuropathy is seen. A small fiber neuropathy can be missed by standard nerve conduction studies however, clinical correlation is required. EMG evaluation of the right lower extremity was within normal limits, no evidence of an overlying lumbosacral radiculopathy was seen.  Jill Alexanders MD 10/13/2018 9:25 AM  --------- MRI pelvis IMPRESSION: 1. Partial tears of the bilateral gluteus minimus tendons and strain of the distal left gluteus medius muscle. 2. Mild left greater trochanteric bursitis. 3. No acute osseous abnormality or evidence of metastatic disease. 4. Mild right hip osteoarthritis.   Electronically Signed   By: Titus Dubin M.D.   On: 09/11/2018 15:00     Objective:  VS:  HT:    WT:   BMI:     BP:   HR: bpm  TEMP: ( )  RESP:  Physical Exam Musculoskeletal:     Comments: Patient has pain with extension of the lumbar spine she does have a positive Fortin finger sign on both sides and points over the PSIS bilaterally.  She does have equivocal Patrick's testing with more pain with external rotation than internal rotation.     Ortho Exam Imaging: No results found.

## 2019-10-23 MED ORDER — TRIAMCINOLONE ACETONIDE 40 MG/ML IJ SUSP
40.0000 mg | INTRAMUSCULAR | Status: AC | PRN
  Administered 2019-09-24: 40 mg via INTRA_ARTICULAR

## 2019-10-23 MED ORDER — BUPIVACAINE HCL 0.5 % IJ SOLN
2.0000 mL | INTRAMUSCULAR | Status: AC | PRN
  Administered 2019-09-24: 2 mL via INTRA_ARTICULAR

## 2019-11-05 DIAGNOSIS — Z6825 Body mass index (BMI) 25.0-25.9, adult: Secondary | ICD-10-CM | POA: Diagnosis not present

## 2019-11-05 DIAGNOSIS — M542 Cervicalgia: Secondary | ICD-10-CM | POA: Diagnosis not present

## 2019-11-05 DIAGNOSIS — M4316 Spondylolisthesis, lumbar region: Secondary | ICD-10-CM | POA: Diagnosis not present

## 2019-11-18 ENCOUNTER — Telehealth: Payer: Self-pay | Admitting: Hematology and Oncology

## 2019-11-18 NOTE — Telephone Encounter (Signed)
Returned patient's phone call regarding rescheduling 02/01 appointment, per patient's request appointment has been moved to 02/08.

## 2019-12-30 ENCOUNTER — Ambulatory Visit: Payer: Medicare Other | Admitting: Adult Health

## 2020-01-04 ENCOUNTER — Ambulatory Visit: Payer: BC Managed Care – PPO | Admitting: Hematology and Oncology

## 2020-01-11 NOTE — Progress Notes (Signed)
Patient Care Team: [name], MD as PCP - General (Internal Medicine) Delice Bison, Charlestine Massed, NP as Nurse Practitioner (Hematology and Oncology) Nicholas Lose, MD as Consulting Physician (Hematology and Oncology) Tyler Pita, MD as Consulting Physician (Radiation Oncology) Rolm Bookbinder, MD as Consulting Physician (General Surgery)  DIAGNOSIS:    ICD-10-CM   1. Breast cancer of upper-outer quadrant of left female breast ([place])  C50.412     SUMMARY OF ONCOLOGIC HISTORY: Oncology History  Breast cancer of upper-outer quadrant of left female breast ([place])  03/02/2016 Mammogram   Hypoechoic masses in the upper outer quadrant left breast 2 adjacent irregular masses 2.5-1.4 x 1.2 cm and the other 2.7 x 1.4 x 2.3 cm total span 6.4 cm, enlarged multiple axillary lymph nodes measuring 1.7 cm, T2 N1/N2 stage IIb vs IIIa   03/19/2016 Initial Diagnosis   Left breast biopsy 12:30 position: Invasive ductal carcinoma with DCIS, 1/1 lymph node positive, grade 3, ER 0%, PR 0%, HER-2 negative ratio 1.68, Ki-67 90%   04/05/2016 - 08/16/2016 Neo-Adjuvant Chemotherapy   Dose dense Adriamycin and Cytoxan 4 followed by Abraxane 12; PREVENT trial (atorvastatin versus placebo)   06/18/2016 Procedure   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   09/12/2016 Surgery   Left breast lumpectomy ([place]): IDC, 0.5cm, DCIS 1 cm, fibrosis, margins negative, 1/3 lymph nodes positive for metastases, triple negative, KI67 90%.     10/22/2016 - 12/11/2016 Radiation Therapy   Adjuvant radiation therapy ([place]): 1. The Left breast was treated to 50.4 Gy in 28 fractions at 1.8 Gy per fraction.  2. The Left breast was boosted to 10 Gy in 5 fractions at 2 Gy per fraction.    01/14/2017 - 05/27/2017 Chemotherapy   Xeloda 2000 mg by  mouth twice a day 2 weeks on one week off for 6 months   10/24/2018 - 10/27/2018 Hospital Admission   C1-C2 vertebral fractures after a fall from the ladder: Right vertebral artery blunt vascular injury     CHIEF COMPLIANT: Surveillance of triple negative left breast cancer  INTERVAL HISTORY: Alexis Fox is a 64 y.o. with above-mentioned history of triple negative left breast cancer who is currently on surveillance. Mammogram on 04/06/19 showed no evidence of malignancy bilaterally. She presents to the clinic today for annual follow-up.   ALLERGIES:  is allergic to turmeric and decadron [dexamethasone].  MEDICATIONS:  Current Outpatient Medications  Medication Sig Dispense Refill  . buPROPion (WELLBUTRIN XL) 150 MG 24 hr tablet Take 150 mg by mouth daily.  4  . cetirizine (ZYRTEC) 10 MG tablet Take 10 mg by mouth daily.    . Cholecalciferol (VITAMIN D3) 50 MCG (2000 UT) TABS Take 2,000 Units by mouth daily.     . citalopram (CELEXA) 40 MG tablet Take 40 mg by mouth at bedtime.   3  . clonazePAM (KLONOPIN) 1 MG tablet Take 1 mg by mouth at bedtime.     . cycloSPORINE (RESTASIS) 0.05 % ophthalmic emulsion Place 1 drop into both eyes 2 (two) times daily.    Marland Kitchen docusate sodium (COLACE) 100 MG capsule Take 100 mg by mouth 2 (two) times daily.    Marland Kitchen esomeprazole (NEXIUM) 10 MG packet Take 10 mg by mouth daily before breakfast.    . Iron-Vitamin C (IRON 100/C PO) Take by mouth.    Marland Kitchen  nystatin-triamcinolone ointment (MYCOLOG) Apply 1 application topically 2 (two) times daily. 60 g 1  . oxyCODONE (OXY IR/ROXICODONE) 5 MG immediate release tablet Take 1 tablet (5 mg total) by mouth every 3 (three) hours as needed for moderate pain ((score 4 to 6)). 56 tablet 0  . oxyCODONE-acetaminophen (PERCOCET/ROXICET) 5-325 MG tablet Take 1 tablet by mouth every 8 (eight) hours as needed for severe pain. 30 tablet 0  . pramipexole (MIRAPEX) 0.5 MG tablet Take 1/2 tab at 4 pm and 1 tab at night time prn. 135  tablet 3  . predniSONE (DELTASONE) 10 MG tablet     . Prenatal Vit-Fe Fumarate-FA (PRENATAL MULTIVITAMIN) TABS tablet Take 1 tablet by mouth daily at 12 noon.    Marland Kitchen zolpidem (AMBIEN) 5 MG tablet Take 5 mg by mouth at bedtime.   1   Current Facility-Administered Medications  Medication Dose Route Frequency Provider Last Rate Last Admin  . methylPREDNISolone acetate (DEPO-MEDROL) injection 40 mg  40 mg Intra-articular Once Hilts, Michael, MD       Facility-Administered Medications Ordered in Other Visits  Medication Dose Route Frequency Provider Last Rate Last Admin  . sodium chloride flush (NS) 0.9 % injection 10 mL  10 mL Intracatheter PRN Nicholas Lose, MD   10 mL at 07/19/16 1434    PHYSICAL EXAMINATION: ECOG PERFORMANCE STATUS: 1 - Symptomatic but completely ambulatory  Vitals:   01/12/20 1446  BP: 127/64  Pulse: 79  Resp: 18  Temp: 98 F (36.7 C)  SpO2: 98%   Filed Weights   01/12/20 1446  Weight: 206 lb 12.8 oz (93.8 kg)    BREAST: No palpable masses or nodules in either right or left breasts. No palpable axillary supraclavicular or infraclavicular adenopathy no breast tenderness or nipple discharge. (exam performed in the presence of a chaperone)  LABORATORY DATA:  I have reviewed the data as listed CMP Latest Ref Rng & Units 05/15/2019 12/24/2018 10/25/2018  Glucose 70 - 99 mg/dL 94 - 95  BUN 8 - 23 mg/dL 17 16 10   Creatinine 0.44 - 1.00 mg/dL 0.71 0.89 0.92  Sodium 135 - 145 mmol/L 141 - 139  Potassium 3.5 - 5.1 mmol/L 4.2 - 3.8  Chloride 98 - 111 mmol/L 109 - 103  CO2 22 - 32 mmol/L 24 - 31  Calcium 8.9 - 10.3 mg/dL 9.5 - 8.7(L)  Total Protein 6.0 - 8.5 g/dL - - -  Total Bilirubin 0.2 - 1.2 mg/dL - - -  Alkaline Phos 40 - 150 U/L - - -  AST 5 - 34 U/L - - -  ALT 0 - 55 U/L - - -    Lab Results  Component Value Date   WBC 5.2 05/15/2019   HGB 12.8 05/15/2019   HCT 41.8 05/15/2019   MCV 99.5 05/15/2019   PLT 251 05/15/2019   NEUTROABS 3.0 05/15/2019     ASSESSMENT & PLAN:  Breast cancer of upper-outer quadrant of left female breast ([place]) Mammogram 03/02/2016: Hypoechoic masses in the upper outer quadrant left breast 2 adjacent irregular masses 2.5-1.4 x 1.2 cm and the other 2.7 x 1.4 x 2.3 cm total span 6.4 cm, enlarged multiple axillary lymph nodes measuring 1.7 cm, T2 N1/N2 stage IIb vs IIIa Left breast biopsy 03/19/2016 12:30 position: Invasive ductal carcinoma with DCIS, 1/1 lymph node positive, grade 3, ER 0%, PR 0%, HER-2 negative ratio 1.68, Ki-67 90% MRI breast 08/17/2016: Significant reduction in the size of the tumor from 6.3 cm to 1.9 cm with  low-grade enhancement, resolution of axillary lymphadenopathy.  PREVENT trial: [facility] [id number]: Patient is enrolled in this trial atorvastatin versus placebo No side effects related to the study drug.  Treatment summary:  1. Completed 4 cycles of dose dense Adriamycin and Cytoxan; and a 12 cycles ofAbraxanefrom 04/05/2016 to 08/16/2016 2. left lumpectomy 09/12/2016: IDC 0.5 cm, DCIS 1 cm, margins negative, 1/4 lymph nodes positive, T1aN1 stage II a pathologic stage 3. Adjuvant radiation 10/22/2016 to 12/11/2016 4. Adjuvant Xeloda 1500 mg twice daily 2 weeks on one week off started 01/07/2017, increasedthe dose to 2000g by mouth twice a day from2/26/2018 completed 05/27/2017 Patient refused participation [id number] ------------------------------------------------------------------------------------------------------------------------------------ Surveillance: 1. Breast Exam2/08/2020: Benign 2. Mammogram: 04/06/2019: Stable left lumpectomy site.  Benign fat necrosis upper outer left breast 2:00.   Hospitalization November 2019: C1-C2 vertebral body fractures from a fall from the ladder  She has lots of plans to travel on her artery across [place].  She is waiting for her neck to get better.  Return to clinic in 1 year for follow-up    No orders of the defined types were placed  in this encounter.  The patient has a good understanding of the overall plan. she agrees with it. she will call with any problems that may develop before the next visit here.  Total time spent: 20 mins including face to face time and time spent for planning, charting and coordination of care  Nicholas Lose, MD 01/12/2020  I, Cloyde Reams Dorshimer, am acting as scribe for Dr. Nicholas Lose.  I have reviewed the above documentation for accuracy and completeness, and I agree with the above.

## 2020-01-12 ENCOUNTER — Inpatient Hospital Stay: Payer: Medicare PPO | Attending: Hematology and Oncology | Admitting: Hematology and Oncology

## 2020-01-12 ENCOUNTER — Other Ambulatory Visit: Payer: Self-pay

## 2020-01-12 DIAGNOSIS — Z171 Estrogen receptor negative status [ER-]: Secondary | ICD-10-CM | POA: Diagnosis not present

## 2020-01-12 DIAGNOSIS — Z853 Personal history of malignant neoplasm of breast: Secondary | ICD-10-CM | POA: Diagnosis present

## 2020-01-12 DIAGNOSIS — C50412 Malignant neoplasm of upper-outer quadrant of left female breast: Secondary | ICD-10-CM

## 2020-01-12 DIAGNOSIS — Z9221 Personal history of antineoplastic chemotherapy: Secondary | ICD-10-CM | POA: Diagnosis not present

## 2020-01-12 DIAGNOSIS — Z923 Personal history of irradiation: Secondary | ICD-10-CM | POA: Insufficient documentation

## 2020-01-12 NOTE — Assessment & Plan Note (Signed)
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  PREVENT trial: [facility] [id number]: Patient is enrolled in this trial atorvastatin versus placebo No side effects related to the study drug.  Treatment summary:  1. Completed 4 cycles of dose dense Adriamycin and Cytoxan; and a 12 cycles ofAbraxanefrom 04/05/2016 to 08/16/2016 2. left lumpectomy 09/12/2016: IDC 0.5 cm, DCIS 1 cm, margins negative, 1/4 lymph nodes positive, T1aN1 stage II a pathologic stage 3. Adjuvant radiation 10/22/2016 to 12/11/2016 4. Adjuvant Xeloda 1500 mg twice daily 2 weeks on one week off started 01/07/2017, increasedthe dose to 2000g by mouth twice a day from2/26/2018 completed 05/27/2017 Patient refused participation [id number] ------------------------------------------------------------------------------------------------------------------------------------ Surveillance: 1. Breast Exam2/08/2020: Benign 2. Mammogram: 04/06/2019: Stable left lumpectomy site.  Benign fat necrosis upper outer left breast 2:00.   Hospitalization November 2019: C1-C2 vertebral body fractures from a fall from the ladder  She has lots of plans to travel on her artery across [place].  She is waiting for her neck to get better. Return to clinic in 1 year for follow-up

## 2020-01-13 ENCOUNTER — Ambulatory Visit: Payer: BC Managed Care – PPO | Admitting: Sports Medicine

## 2020-01-13 ENCOUNTER — Telehealth: Payer: Self-pay | Admitting: Hematology and Oncology

## 2020-01-13 NOTE — Telephone Encounter (Signed)
I talk with patient regarding schedule  

## 2020-01-14 ENCOUNTER — Ambulatory Visit: Payer: Medicare PPO | Admitting: Sports Medicine

## 2020-01-14 ENCOUNTER — Encounter: Payer: Self-pay | Admitting: Sports Medicine

## 2020-01-14 ENCOUNTER — Other Ambulatory Visit: Payer: Self-pay

## 2020-01-14 ENCOUNTER — Other Ambulatory Visit: Payer: Self-pay | Admitting: Sports Medicine

## 2020-01-14 ENCOUNTER — Ambulatory Visit (INDEPENDENT_AMBULATORY_CARE_PROVIDER_SITE_OTHER): Payer: Medicare PPO

## 2020-01-14 DIAGNOSIS — M79672 Pain in left foot: Secondary | ICD-10-CM

## 2020-01-14 DIAGNOSIS — M779 Enthesopathy, unspecified: Secondary | ICD-10-CM

## 2020-01-14 DIAGNOSIS — D3610 Benign neoplasm of peripheral nerves and autonomic nervous system, unspecified: Secondary | ICD-10-CM

## 2020-01-14 DIAGNOSIS — M79671 Pain in right foot: Secondary | ICD-10-CM

## 2020-01-14 DIAGNOSIS — R413 Other amnesia: Secondary | ICD-10-CM | POA: Insufficient documentation

## 2020-01-14 DIAGNOSIS — M533 Sacrococcygeal disorders, not elsewhere classified: Secondary | ICD-10-CM | POA: Insufficient documentation

## 2020-01-14 MED ORDER — TRIAMCINOLONE ACETONIDE 10 MG/ML IJ SUSP
10.0000 mg | Freq: Once | INTRAMUSCULAR | Status: AC
  Administered 2020-01-14: 20:00:00 10 mg

## 2020-01-14 NOTE — Progress Notes (Signed)
Subjective: Alexis Fox is a 64 y.o. female patient who presents to office for evaluation of left foot pain. Patient complains of progressive pain especially over the last 6 months in the left foot at the ball especially at 2-3 toes with sharp pains. Ranks pain 0-8/10 and is now interferring with daily activities, worse when barefoot and with sandals. Patient has tried change of shoes with no relief in symptoms. Patient denies any other pedal complaints. Denies injury/trip/fall/sprain/any causative factors except having numbness to 2nd toe since she had foot surgery years ago/back in 2015.   Patient Active Problem List   Diagnosis Date Noted  . Memory deficit 01/14/2020  . Unilateral primary osteoarthritis, right hip 09/01/2019  . Trochanteric bursitis, left hip 07/14/2019  . S/P lumbar fusion 05/18/2019  . OSA (obstructive sleep apnea) 10/28/2018  . Pain managed using patient-controlled analgesia (PCA) 10/25/2018  . Fall 10/24/2018  . DDD (degenerative disc disease), lumbosacral 09/15/2018  . Chemotherapy-induced neuropathy ([place]) 09/15/2018  . Central line complication 46/96/2952  . Genetic testing 05/15/2016  . Family history of breast cancer   . Family history of brain cancer   . Breast cancer of upper-outer quadrant of left female breast ([place]) 03/20/2016  . Transaminitis 10/03/2015  . Symptomatic cholelithiasis 10/03/2015  . RUQ abdominal pain   . Depression 06/24/2013  . RLS (restless legs syndrome) 06/24/2013    Current Outpatient Medications on File Prior to Visit  Medication Sig Dispense Refill  . buPROPion (WELLBUTRIN XL) 150 MG 24 hr tablet Take 150 mg by mouth daily.  4  . cetirizine (ZYRTEC) 10 MG tablet Take 10 mg by mouth daily.    . Cholecalciferol (VITAMIN D3) 50 MCG (2000 UT) TABS Take 2,000 Units by mouth daily.     . citalopram (CELEXA) 40 MG tablet Take 40 mg by mouth at bedtime.   3  . clonazePAM (KLONOPIN) 1 MG tablet Take 1 mg by mouth at bedtime.     .  cycloSPORINE (RESTASIS) 0.05 % ophthalmic emulsion Place 1 drop into both eyes 2 (two) times daily.    Marland Kitchen docusate sodium (COLACE) 100 MG capsule Take 100 mg by mouth 2 (two) times daily.    Marland Kitchen esomeprazole (NEXIUM) 10 MG packet Take 10 mg by mouth daily before breakfast.    . Iron-Vitamin C (IRON 100/C PO) Take by mouth.    . nystatin-triamcinolone ointment (MYCOLOG) Apply 1 application topically 2 (two) times daily. 60 g 1  . oxyCODONE-acetaminophen (PERCOCET/ROXICET) 5-325 MG tablet Take 1 tablet by mouth every 8 (eight) hours as needed for severe pain. 30 tablet 0  . pramipexole (MIRAPEX) 0.5 MG tablet Take 1/2 tab at 4 pm and 1 tab at night time prn. 135 tablet 3  . Prenatal Vit-Fe Fumarate-FA (PRENATAL MULTIVITAMIN) TABS tablet Take 1 tablet by mouth daily at 12 noon.    Marland Kitchen zolpidem (AMBIEN) 5 MG tablet Take 5 mg by mouth at bedtime.   1   Current Facility-Administered Medications on File Prior to Visit  Medication Dose Route Frequency Provider Last Rate Last Admin  . methylPREDNISolone acetate (DEPO-MEDROL) injection 40 mg  40 mg Intra-articular Once Hilts, Michael, MD      . sodium chloride flush (NS) 0.9 % injection 10 mL  10 mL Intracatheter PRN Nicholas Lose, MD   10 mL at 07/19/16 1434    Allergies  Allergen Reactions  . Turmeric Diarrhea    Severe diarrhea  . Decadron [Dexamethasone]     Exacerbates restless leg  Objective:  General: Alert and oriented x3 in no acute distress  Dermatology: Old surgical scars well healed on left, No open lesions bilateral lower extremities, no webspace macerations, no ecchymosis bilateral, all nails x 10 are well manicured.  Vascular: Dorsalis Pedis and Posterior Tibial pedal pulses palpable, Capillary Fill Time 3 seconds,(+) pedal hair growth bilateral, no edema bilateral lower extremities, Temperature gradient within normal limits.  Neurology: Gross sensation intact via light touch bilateral,Numbness and tingling to Left 2-3 toes, negative  moulders sign but the met heads are in close proximity.   Musculoskeletal: Mild tenderness with palpation at Left 2nd interspace of the left foot and with palpation to ball of left foot. Strength within normal limits, no other acute findings.    Gait: Antalgic gait  Xrays  Left Foot   Impression: screw intact at 2nd metatarsal, lucency at 1st met head at area of previous hardware that has been removed, no fracture, no dislocation, no other acute findings.   Assessment and Plan: Problem List Items Addressed This Visit    None    Visit Diagnoses    Neuroma    -  Primary   Relevant Medications   triamcinolone acetonide (KENALOG) 10 MG/ML injection 10 mg (Completed) (Start on 01/14/2020  7:45 PM)   Capsulitis       Left foot pain           -Complete examination performed -Xrays reviewed -Discussed treatment options -After oral consent and aseptic prep, injected a mixture containing 1 ml of 2%  plain lidocaine, 1 ml 0.5% plain marcaine, 0.5 ml of kenalog 10 and 0.5 ml of dexamethasone phosphate into left 2nd interspace without complication. Post-injection care discussed with patient.  -Continue with good supportive shoes  -Patient to return to office if no better after 2 weeks or sooner if condition worsens.  Landis Martins, DPM

## 2020-01-15 ENCOUNTER — Other Ambulatory Visit: Payer: Self-pay | Admitting: Neurological Surgery

## 2020-01-15 ENCOUNTER — Other Ambulatory Visit: Payer: Self-pay | Admitting: Sports Medicine

## 2020-01-15 DIAGNOSIS — M779 Enthesopathy, unspecified: Secondary | ICD-10-CM

## 2020-01-15 DIAGNOSIS — D3610 Benign neoplasm of peripheral nerves and autonomic nervous system, unspecified: Secondary | ICD-10-CM

## 2020-01-18 ENCOUNTER — Other Ambulatory Visit: Payer: Self-pay | Admitting: Neurological Surgery

## 2020-01-18 DIAGNOSIS — M533 Sacrococcygeal disorders, not elsewhere classified: Secondary | ICD-10-CM

## 2020-01-20 ENCOUNTER — Other Ambulatory Visit (HOSPITAL_COMMUNITY)
Admission: RE | Admit: 2020-01-20 | Discharge: 2020-01-20 | Disposition: A | Discharge status: Home / Self Care | Payer: Medicare PPO | Source: Ambulatory Visit | Attending: Neurological Surgery | Admitting: Neurological Surgery

## 2020-01-20 ENCOUNTER — Encounter (HOSPITAL_COMMUNITY): Payer: Self-pay

## 2020-01-20 ENCOUNTER — Encounter (HOSPITAL_COMMUNITY)
Admission: RE | Admit: 2020-01-20 | Discharge: 2020-01-20 | Disposition: A | Discharge status: Home / Self Care | Payer: Medicare PPO | Source: Ambulatory Visit | Attending: Neurological Surgery | Admitting: Neurological Surgery

## 2020-01-20 ENCOUNTER — Other Ambulatory Visit: Payer: Self-pay

## 2020-01-20 DIAGNOSIS — Z20822 Contact with and (suspected) exposure to covid-19: Secondary | ICD-10-CM | POA: Diagnosis not present

## 2020-01-20 DIAGNOSIS — Z853 Personal history of malignant neoplasm of breast: Secondary | ICD-10-CM | POA: Diagnosis not present

## 2020-01-20 DIAGNOSIS — Z01812 Encounter for preprocedural laboratory examination: Secondary | ICD-10-CM | POA: Diagnosis present

## 2020-01-20 DIAGNOSIS — M542 Cervicalgia: Secondary | ICD-10-CM | POA: Diagnosis not present

## 2020-01-20 DIAGNOSIS — Z9221 Personal history of antineoplastic chemotherapy: Secondary | ICD-10-CM | POA: Insufficient documentation

## 2020-01-20 HISTORY — DX: Gastro-esophageal reflux disease without esophagitis: K21.9

## 2020-01-20 HISTORY — DX: Unspecified cataract: H26.9

## 2020-01-20 HISTORY — DX: Cervicalgia: M54.2

## 2020-01-20 HISTORY — DX: Headache, unspecified: R51.9

## 2020-01-20 LAB — CBC WITH DIFFERENTIAL/PLATELET
Abs Immature Granulocytes: 0.04 10*3/uL (ref 0.00–0.07)
Basophils Absolute: 0.1 10*3/uL (ref 0.0–0.1)
Basophils Relative: 1 %
Eosinophils Absolute: 0.2 10*3/uL (ref 0.0–0.5)
Eosinophils Relative: 3 %
HCT: 47.2 % — ABNORMAL HIGH (ref 36.0–46.0)
Hemoglobin: 14.6 g/dL (ref 12.0–15.0)
Immature Granulocytes: 1 %
Lymphocytes Relative: 26 %
Lymphs Abs: 1.5 10*3/uL (ref 0.7–4.0)
MCH: 31.3 pg (ref 26.0–34.0)
MCHC: 30.9 g/dL (ref 30.0–36.0)
MCV: 101.1 fL — ABNORMAL HIGH (ref 80.0–100.0)
Monocytes Absolute: 0.6 10*3/uL (ref 0.1–1.0)
Monocytes Relative: 11 %
Neutro Abs: 3.5 10*3/uL (ref 1.7–7.7)
Neutrophils Relative %: 58 %
Platelets: 244 10*3/uL (ref 150–400)
RBC: 4.67 MIL/uL (ref 3.87–5.11)
RDW: 13 % (ref 11.5–15.5)
WBC: 5.8 10*3/uL (ref 4.0–10.5)
nRBC: 0.3 % — ABNORMAL HIGH (ref 0.0–0.2)

## 2020-01-20 LAB — BASIC METABOLIC PANEL
Anion gap: 10 (ref 5–15)
BUN: 15 mg/dL (ref 8–23)
CO2: 27 mmol/L (ref 22–32)
Calcium: 10 mg/dL (ref 8.9–10.3)
Chloride: 102 mmol/L (ref 98–111)
Creatinine, Ser: 0.97 mg/dL (ref 0.44–1.00)
GFR calc Af Amer: >60 mL/min (ref >60)
GFR calc non Af Amer: >60 mL/min (ref >60)
Glucose, Bld: 92 mg/dL (ref 70–99)
Potassium: 4.5 mmol/L (ref 3.5–5.1)
Sodium: 139 mmol/L (ref 135–145)

## 2020-01-20 LAB — SARS CORONAVIRUS 2 (TAT 6-24 HRS): SARS Coronavirus 2: NEGATIVE

## 2020-01-20 LAB — PROTIME-INR
INR: 1.1 (ref 0.8–1.2)
Prothrombin Time: 13.8 seconds (ref 11.4–15.2)

## 2020-01-20 LAB — SURGICAL PCR SCREEN
MRSA, PCR: NEGATIVE
Staphylococcus aureus: NEGATIVE

## 2020-01-20 NOTE — Anesthesia Preprocedure Evaluation (Addendum)
Anesthesia Evaluation  Patient identified by MRN, date of birth, ID band Patient awake    Reviewed: Allergy & Precautions, NPO status , Patient's Chart, lab work & pertinent test results  Airway Mallampati: II  TM Distance: >3 FB Neck ROM: Full    Dental no notable dental hx.    Pulmonary sleep apnea ,    Pulmonary exam normal breath sounds clear to auscultation       Cardiovascular negative cardio ROS Normal cardiovascular exam Rhythm:Regular Rate:Normal     Neuro/Psych  Headaches, Anxiety Depression negative psych ROS   GI/Hepatic Neg liver ROS, GERD  ,  Endo/Other  negative endocrine ROS  Renal/GU negative Renal ROS  negative genitourinary   Musculoskeletal  (+) Arthritis ,   Abdominal (+) + obese,   Peds negative pediatric ROS (+)  Hematology negative hematology ROS (+)   Anesthesia Other Findings   Reproductive/Obstetrics negative OB ROS                             Anesthesia Physical Anesthesia Plan  ASA: II  Anesthesia Plan: General   Post-op Pain Management:    Induction: Intravenous  PONV Risk Score and Plan: 3 and Ondansetron, Dexamethasone, Midazolam and Treatment may vary due to age or medical condition  Airway Management Planned: Oral ETT  Additional Equipment:   Intra-op Plan:   Post-operative Plan: Extubation in OR  Informed Consent: I have reviewed the patients History and Physical, chart, labs and discussed the procedure including the risks, benefits and alternatives for the proposed anesthesia with the patient or authorized representative who has indicated his/her understanding and acceptance.     Dental advisory given  Plan Discussed with: CRNA  Anesthesia Plan Comments: (History of left breast cancer s/p chemo and radiation, followed by Dr. Lindi Adie.   She reports issues with post-operative hypotension. No periop complications noted on last surgery  05/19/19 lumbar fusion.  She fell from a ladder on 10/22/18 and sustained a nondisplaced fractures through the posterior arch/bilateral lamina of C1, complicated fracture of C2. She was treated conservative. 12/24/18 CTA neck showed healing distal right vertebral artery minimal intimal injury without pseudoaneurysm, dissection flap, or stenosis. 04/29/19 CT c-spine showed nearly healed C2 fracture with unchanged trace anterolisthesis at C2-C3.  Preop labs reviewed, unremarkable.   EKG 05/15/19: Normal sinus rhythm. Rate 66. Incomplete right bundle branch block  TTE 05/14/16: - Left ventricle: The cavity size was normal. Wall thickness was  normal. Systolic function was normal. The estimated ejection  fraction was in the range of 60% to 65%. Wall motion was normal;  there were no regional wall motion abnormalities. Features are  consistent with a pseudonormal left ventricular filling pattern,  with concomitant abnormal relaxation and increased filling  pressure (grade 2 diastolic dysfunction).  - Pulmonary arteries: PA peak pressure: 33 mm Hg (S).  - Impressions: Lateral s&' = 10.5 cm/sec. GLS -23.4%  )       Anesthesia Quick Evaluation

## 2020-01-20 NOTE — Progress Notes (Signed)
Anesthesia Chart Review:  History of left breast cancer s/p chemo and radiation, followed by Dr. Lindi Adie.   She reports issues with post-operative hypotension. No periop complications noted on last surgery 05/19/19 lumbar fusion.  She fell from a ladder on 10/22/18 and sustained a nondisplaced fractures through the posterior arch/bilateral lamina of C1, complicated fracture of C2. She was treated conservative. 12/24/18 CTA neck showed healing distal right vertebral artery minimal intimal injury without pseudoaneurysm, dissection flap, or stenosis. 04/29/19 CT c-spine showed nearly healed C2 fracture with unchanged trace anterolisthesis at C2-C3.  Preop labs reviewed, unremarkable.   EKG 05/15/19: Normal sinus rhythm. Rate 66. Incomplete right bundle branch block  TTE 05/14/16: - Left ventricle: The cavity size was normal. Wall thickness was  normal. Systolic function was normal. The estimated ejection  fraction was in the range of 60% to 65%. Wall motion was normal;  there were no regional wall motion abnormalities. Features are  consistent with a pseudonormal left ventricular filling pattern,  with concomitant abnormal relaxation and increased filling  pressure (grade 2 diastolic dysfunction).  - Pulmonary arteries: PA peak pressure: 33 mm Hg (S).  - Impressions: Lateral s&' = 10.5 cm/sec. GLS -23.4%    Wynonia Musty [facility] Short Stay Center/Anesthesiology Phone [phone] 01/20/2020 4:27 PM

## 2020-01-20 NOTE — Progress Notes (Signed)
Pt denies SOB and chest pain. Pt stated that she was treated by Dr. Haroldine Laws, Cardiology ( echo). Pt stated that she is under the care of Dr. Lindi Adie, Oncology and Dr. [name], PCP. Pt denies having a stress test and cardiac cath. Pt denies recent labs. Pt verbalized understanding of all pre-op instructions. Pt chart forwarded to PA, Anesthesiology, for review.

## 2020-01-20 NOTE — Pre-Procedure Instructions (Signed)
   Alexis Fox  01/20/2020    [id number] - [place], [place] - [address] [place]  [address] Phone: [phone] Fax: [phone]   Your procedure is scheduled on Friday, January 22, 2020  Report to [facility] Admitting at 12:30 P.M.  Call this number if you have problems the morning of surgery:  [phone]   Remember: Bring your CPAP mask, hose and machine on day of surgery.   Do not eat or drink after midnight the night before surgery.   Take these medicines the morning of surgery with A SIP OF WATER: Esomeprazole Magnesium, buPROPion (WELLBUTRIN XL),  cycloSPORINE (RESTASIS) eye drops If needed:  oxyCODONE-acetaminophen (PERCOCET/ROXICET) for severe pain  Stop taking Aspirin (unless otherwise advised by surgeon), all vitamins, fish oil and herbal medications. Do not take any NSAIDs ie: Ibuprofen, Advil, Naproxen (Aleve), Motrin, BC and Goody Powder; stop now.   Do not wear jewelry, make-up or nail polish.  Do not wear lotions, powders, or perfumes, or deodorant.  Do not shave 48 hours prior to surgery.  Do not bring valuables to the hospital.  [facility] is not responsible for any belongings or valuables.  Contacts, dentures or bridgework may not be worn into surgery.  Leave your suitcase in the car.  After surgery it may be brought to your room.  For patients admitted to the hospital, discharge time will be determined by your treatment team.  Patients discharged the day of surgery will not be allowed to drive home.  Special instructions: See " [facility] Preparing For Surgery " sheet.  Please read over the following fact sheets that you were given. Pain Booklet, Coughing and Deep Breathing and Surgical Site Infection Prevention

## 2020-01-22 ENCOUNTER — Ambulatory Visit (HOSPITAL_COMMUNITY): Payer: Medicare PPO

## 2020-01-22 ENCOUNTER — Other Ambulatory Visit: Payer: Self-pay

## 2020-01-22 ENCOUNTER — Ambulatory Visit (HOSPITAL_COMMUNITY): Payer: Medicare PPO | Admitting: Physician Assistant

## 2020-01-22 ENCOUNTER — Observation Stay (HOSPITAL_COMMUNITY)
Admission: AD | Admit: 2020-01-22 | Discharge: 2020-01-25 | Disposition: A | Discharge status: Home / Self Care | Payer: Medicare PPO | Attending: Neurological Surgery | Admitting: Neurological Surgery

## 2020-01-22 ENCOUNTER — Encounter (HOSPITAL_COMMUNITY)
Admission: AD | Disposition: A | Discharge status: Home / Self Care | Payer: Self-pay | Source: Home / Self Care | Attending: Neurological Surgery

## 2020-01-22 ENCOUNTER — Encounter (HOSPITAL_COMMUNITY): Payer: Self-pay | Admitting: Neurological Surgery

## 2020-01-22 DIAGNOSIS — K219 Gastro-esophageal reflux disease without esophagitis: Secondary | ICD-10-CM | POA: Insufficient documentation

## 2020-01-22 DIAGNOSIS — Z836 Family history of other diseases of the respiratory system: Secondary | ICD-10-CM | POA: Diagnosis not present

## 2020-01-22 DIAGNOSIS — M532X2 Spinal instabilities, cervical region: Secondary | ICD-10-CM | POA: Diagnosis not present

## 2020-01-22 DIAGNOSIS — Z79899 Other long term (current) drug therapy: Secondary | ICD-10-CM | POA: Insufficient documentation

## 2020-01-22 DIAGNOSIS — Z888 Allergy status to other drugs, medicaments and biological substances status: Secondary | ICD-10-CM | POA: Insufficient documentation

## 2020-01-22 DIAGNOSIS — Z807 Family history of other malignant neoplasms of lymphoid, hematopoietic and related tissues: Secondary | ICD-10-CM | POA: Diagnosis not present

## 2020-01-22 DIAGNOSIS — Z9102 Food additives allergy status: Secondary | ICD-10-CM | POA: Insufficient documentation

## 2020-01-22 DIAGNOSIS — M542 Cervicalgia: Secondary | ICD-10-CM | POA: Insufficient documentation

## 2020-01-22 DIAGNOSIS — M170 Bilateral primary osteoarthritis of knee: Secondary | ICD-10-CM | POA: Insufficient documentation

## 2020-01-22 DIAGNOSIS — G4730 Sleep apnea, unspecified: Secondary | ICD-10-CM | POA: Insufficient documentation

## 2020-01-22 DIAGNOSIS — Z923 Personal history of irradiation: Secondary | ICD-10-CM | POA: Insufficient documentation

## 2020-01-22 DIAGNOSIS — Z853 Personal history of malignant neoplasm of breast: Secondary | ICD-10-CM | POA: Diagnosis not present

## 2020-01-22 DIAGNOSIS — F329 Major depressive disorder, single episode, unspecified: Secondary | ICD-10-CM | POA: Insufficient documentation

## 2020-01-22 DIAGNOSIS — Z8249 Family history of ischemic heart disease and other diseases of the circulatory system: Secondary | ICD-10-CM | POA: Insufficient documentation

## 2020-01-22 DIAGNOSIS — I4510 Unspecified right bundle-branch block: Secondary | ICD-10-CM | POA: Insufficient documentation

## 2020-01-22 DIAGNOSIS — F419 Anxiety disorder, unspecified: Secondary | ICD-10-CM | POA: Insufficient documentation

## 2020-01-22 DIAGNOSIS — Z808 Family history of malignant neoplasm of other organs or systems: Secondary | ICD-10-CM | POA: Insufficient documentation

## 2020-01-22 DIAGNOSIS — G2581 Restless legs syndrome: Secondary | ICD-10-CM | POA: Diagnosis not present

## 2020-01-22 DIAGNOSIS — M1990 Unspecified osteoarthritis, unspecified site: Secondary | ICD-10-CM | POA: Insufficient documentation

## 2020-01-22 DIAGNOSIS — R1310 Dysphagia, unspecified: Secondary | ICD-10-CM | POA: Diagnosis not present

## 2020-01-22 DIAGNOSIS — Z803 Family history of malignant neoplasm of breast: Secondary | ICD-10-CM | POA: Diagnosis not present

## 2020-01-22 DIAGNOSIS — Z981 Arthrodesis status: Secondary | ICD-10-CM

## 2020-01-22 DIAGNOSIS — Z9049 Acquired absence of other specified parts of digestive tract: Secondary | ICD-10-CM | POA: Insufficient documentation

## 2020-01-22 HISTORY — PX: ANTERIOR CERVICAL DECOMP/DISCECTOMY FUSION: SHX1161

## 2020-01-22 SURGERY — ANTERIOR CERVICAL DECOMPRESSION/DISCECTOMY FUSION 1 LEVEL
Anesthesia: General

## 2020-01-22 MED ORDER — OXYCODONE HCL 5 MG PO TABS
5.0000 mg | ORAL_TABLET | ORAL | Status: DC | PRN
  Administered 2020-01-22 – 2020-01-23 (×3): 5 mg via ORAL
  Filled 2020-01-22 (×5): qty 1

## 2020-01-22 MED ORDER — SODIUM CHLORIDE 0.9% FLUSH: 3.0000 mL | INTRAVENOUS | Status: DC | PRN

## 2020-01-22 MED ORDER — HYDROMORPHONE HCL 1 MG/ML IJ SOLN
INTRAMUSCULAR | Status: AC
  Filled 2020-01-22: qty 1

## 2020-01-22 MED ORDER — ROCURONIUM BROMIDE 10 MG/ML (PF) SYRINGE
PREFILLED_SYRINGE | INTRAVENOUS | Status: AC
  Filled 2020-01-22: qty 10

## 2020-01-22 MED ORDER — ONDANSETRON HCL 4 MG/2ML IJ SOLN
INTRAMUSCULAR | Status: DC | PRN
  Administered 2020-01-22: 4 mg via INTRAVENOUS

## 2020-01-22 MED ORDER — EPHEDRINE SULFATE-NACL 50-0.9 MG/10ML-% IV SOSY
PREFILLED_SYRINGE | INTRAVENOUS | Status: DC | PRN
  Administered 2020-01-22: 15 mg via INTRAVENOUS
  Administered 2020-01-22 (×2): 10 mg via INTRAVENOUS

## 2020-01-22 MED ORDER — CEFAZOLIN SODIUM-DEXTROSE 2-4 GM/100ML-% IV SOLN
2.0000 g | Freq: Three times a day (TID) | INTRAVENOUS | Status: AC
  Administered 2020-01-22 (×2): 2 g via INTRAVENOUS
  Filled 2020-01-22 (×2): qty 100

## 2020-01-22 MED ORDER — EPHEDRINE 5 MG/ML INJ
INTRAVENOUS | Status: AC
  Filled 2020-01-22: qty 10

## 2020-01-22 MED ORDER — LACTATED RINGERS IV SOLN: INTRAVENOUS | Status: DC | PRN

## 2020-01-22 MED ORDER — 0.9 % SODIUM CHLORIDE (POUR BTL) OPTIME
TOPICAL | Status: DC | PRN
  Administered 2020-01-22: 1000 mL

## 2020-01-22 MED ORDER — LIDOCAINE 2% (20 MG/ML) 5 ML SYRINGE
INTRAMUSCULAR | Status: DC | PRN
  Administered 2020-01-22: 80 mg via INTRAVENOUS

## 2020-01-22 MED ORDER — PROPOFOL 10 MG/ML IV BOLUS
INTRAVENOUS | Status: AC
  Filled 2020-01-22: qty 20

## 2020-01-22 MED ORDER — SODIUM CHLORIDE 0.9 % IV SOLN
INTRAVENOUS | Status: DC | PRN
  Administered 2020-01-22: 500 mL

## 2020-01-22 MED ORDER — ROCURONIUM BROMIDE 10 MG/ML (PF) SYRINGE
PREFILLED_SYRINGE | INTRAVENOUS | Status: DC | PRN
  Administered 2020-01-22: 90 mg via INTRAVENOUS

## 2020-01-22 MED ORDER — FENTANYL CITRATE (PF) 250 MCG/5ML IJ SOLN
INTRAMUSCULAR | Status: AC
  Filled 2020-01-22: qty 5

## 2020-01-22 MED ORDER — ONDANSETRON HCL 4 MG/2ML IJ SOLN
INTRAMUSCULAR | Status: AC
  Filled 2020-01-22: qty 2

## 2020-01-22 MED ORDER — PHENYLEPHRINE HCL-NACL 10-0.9 MG/250ML-% IV SOLN
INTRAVENOUS | Status: DC | PRN
  Administered 2020-01-22: 35 ug/min via INTRAVENOUS

## 2020-01-22 MED ORDER — OXYCODONE HCL 5 MG PO TABS
ORAL_TABLET | ORAL | Status: AC
  Filled 2020-01-22: qty 1

## 2020-01-22 MED ORDER — BUPROPION HCL ER (XL) 150 MG PO TB24
150.0000 mg | ORAL_TABLET | Freq: Every day | ORAL | Status: DC
  Administered 2020-01-23 – 2020-01-25 (×3): 150 mg via ORAL
  Filled 2020-01-22 (×3): qty 1

## 2020-01-22 MED ORDER — HYDROMORPHONE HCL 1 MG/ML IJ SOLN
0.2500 mg | INTRAMUSCULAR | Status: DC | PRN
  Administered 2020-01-22 (×4): 0.5 mg via INTRAVENOUS

## 2020-01-22 MED ORDER — MIDAZOLAM HCL 2 MG/2ML IJ SOLN
INTRAMUSCULAR | Status: AC
  Filled 2020-01-22: qty 2

## 2020-01-22 MED ORDER — METHOCARBAMOL 1000 MG/10ML IJ SOLN
500.0000 mg | Freq: Four times a day (QID) | INTRAVENOUS | Status: DC | PRN
  Filled 2020-01-22: qty 5

## 2020-01-22 MED ORDER — CITALOPRAM HYDROBROMIDE 20 MG PO TABS
40.0000 mg | ORAL_TABLET | Freq: Every day | ORAL | Status: DC
  Administered 2020-01-22 – 2020-01-24 (×3): 40 mg via ORAL
  Filled 2020-01-22 (×3): qty 2

## 2020-01-22 MED ORDER — HEMOSTATIC AGENTS (NO CHARGE) OPTIME
TOPICAL | Status: DC | PRN
  Administered 2020-01-22: 1 via TOPICAL

## 2020-01-22 MED ORDER — BUPIVACAINE HCL (PF) 0.25 % IJ SOLN
INTRAMUSCULAR | Status: DC | PRN
  Administered 2020-01-22: 6 mL

## 2020-01-22 MED ORDER — MEPERIDINE HCL 25 MG/ML IJ SOLN: 6.2500 mg | INTRAMUSCULAR | Status: DC | PRN

## 2020-01-22 MED ORDER — BUPIVACAINE HCL (PF) 0.25 % IJ SOLN
INTRAMUSCULAR | Status: AC
  Filled 2020-01-22: qty 30

## 2020-01-22 MED ORDER — MENTHOL 3 MG MT LOZG
1.0000 | LOZENGE | OROMUCOSAL | Status: DC | PRN
  Administered 2020-01-22: 3 mg via ORAL
  Filled 2020-01-22: qty 9

## 2020-01-22 MED ORDER — SODIUM CHLORIDE 0.9% FLUSH
3.0000 mL | Freq: Two times a day (BID) | INTRAVENOUS | Status: DC
  Administered 2020-01-22 – 2020-01-24 (×4): 3 mL via INTRAVENOUS

## 2020-01-22 MED ORDER — CYCLOSPORINE 0.05 % OP EMUL
1.0000 [drp] | Freq: Two times a day (BID) | OPHTHALMIC | Status: DC
  Administered 2020-01-22 – 2020-01-25 (×6): 1 [drp] via OPHTHALMIC
  Filled 2020-01-22 (×8): qty 1

## 2020-01-22 MED ORDER — SENNA 8.6 MG PO TABS
1.0000 | ORAL_TABLET | Freq: Two times a day (BID) | ORAL | Status: DC
  Administered 2020-01-22 – 2020-01-25 (×7): 8.6 mg via ORAL
  Filled 2020-01-22 (×7): qty 1

## 2020-01-22 MED ORDER — THROMBIN 5000 UNITS EX SOLR
OROMUCOSAL | Status: DC | PRN
  Administered 2020-01-22: 08:00:00 5 mL via TOPICAL

## 2020-01-22 MED ORDER — CEFAZOLIN SODIUM-DEXTROSE 2-4 GM/100ML-% IV SOLN
2.0000 g | INTRAVENOUS | Status: AC
  Administered 2020-01-22: 2 g via INTRAVENOUS

## 2020-01-22 MED ORDER — OXYCODONE HCL 5 MG/5ML PO SOLN: 5.0000 mg | Freq: Once | ORAL | Status: AC | PRN

## 2020-01-22 MED ORDER — ACETAMINOPHEN 325 MG PO TABS
650.0000 mg | ORAL_TABLET | ORAL | Status: DC | PRN
  Administered 2020-01-23 – 2020-01-24 (×2): 650 mg via ORAL
  Filled 2020-01-22 (×2): qty 2

## 2020-01-22 MED ORDER — SUGAMMADEX SODIUM 200 MG/2ML IV SOLN
INTRAVENOUS | Status: DC | PRN
  Administered 2020-01-22: 200 mg via INTRAVENOUS

## 2020-01-22 MED ORDER — PRAMIPEXOLE DIHYDROCHLORIDE 0.25 MG PO TABS
0.5000 mg | ORAL_TABLET | Freq: Every day | ORAL | Status: DC
  Administered 2020-01-22 – 2020-01-24 (×3): 0.5 mg via ORAL
  Filled 2020-01-22 (×3): qty 2

## 2020-01-22 MED ORDER — FENTANYL CITRATE (PF) 250 MCG/5ML IJ SOLN
INTRAMUSCULAR | Status: DC | PRN
  Administered 2020-01-22: 100 ug via INTRAVENOUS
  Administered 2020-01-22: 50 ug via INTRAVENOUS

## 2020-01-22 MED ORDER — CHLORHEXIDINE GLUCONATE CLOTH 2 % EX PADS: 6.0000 | MEDICATED_PAD | Freq: Once | CUTANEOUS | Status: DC

## 2020-01-22 MED ORDER — OXYCODONE HCL 5 MG PO TABS
5.0000 mg | ORAL_TABLET | Freq: Once | ORAL | Status: AC | PRN
  Administered 2020-01-22: 5 mg via ORAL

## 2020-01-22 MED ORDER — PROPOFOL 10 MG/ML IV BOLUS
INTRAVENOUS | Status: DC | PRN
  Administered 2020-01-22: 150 mg via INTRAVENOUS

## 2020-01-22 MED ORDER — ACETAMINOPHEN 650 MG RE SUPP: 650.0000 mg | RECTAL | Status: DC | PRN

## 2020-01-22 MED ORDER — METHOCARBAMOL 500 MG PO TABS
ORAL_TABLET | ORAL | Status: AC
  Administered 2020-01-22: 10:00:00 500 mg
  Filled 2020-01-22: qty 1

## 2020-01-22 MED ORDER — PROMETHAZINE HCL 25 MG/ML IJ SOLN: 6.2500 mg | INTRAMUSCULAR | Status: DC | PRN

## 2020-01-22 MED ORDER — THROMBIN 5000 UNITS EX SOLR
CUTANEOUS | Status: DC | PRN
  Administered 2020-01-22 (×2): 5000 [IU] via TOPICAL

## 2020-01-22 MED ORDER — POTASSIUM CHLORIDE IN NACL 20-0.9 MEQ/L-% IV SOLN: INTRAVENOUS | Status: DC

## 2020-01-22 MED ORDER — ONDANSETRON HCL 4 MG PO TABS: 4.0000 mg | ORAL_TABLET | Freq: Four times a day (QID) | ORAL | Status: DC | PRN

## 2020-01-22 MED ORDER — THROMBIN 5000 UNITS EX SOLR
CUTANEOUS | Status: AC
  Filled 2020-01-22: qty 5000

## 2020-01-22 MED ORDER — ONDANSETRON HCL 4 MG/2ML IJ SOLN: 4.0000 mg | Freq: Four times a day (QID) | INTRAMUSCULAR | Status: DC | PRN

## 2020-01-22 MED ORDER — CLONAZEPAM 1 MG PO TABS
1.0000 mg | ORAL_TABLET | Freq: Every day | ORAL | Status: DC
  Administered 2020-01-22 – 2020-01-24 (×3): 1 mg via ORAL
  Filled 2020-01-22 (×3): qty 1

## 2020-01-22 MED ORDER — LIDOCAINE 2% (20 MG/ML) 5 ML SYRINGE
INTRAMUSCULAR | Status: AC
  Filled 2020-01-22: qty 5

## 2020-01-22 MED ORDER — SODIUM CHLORIDE 0.9 % IV SOLN: 250.0000 mL | INTRAVENOUS | Status: DC

## 2020-01-22 MED ORDER — PHENOL 1.4 % MT LIQD
1.0000 | OROMUCOSAL | Status: DC | PRN
  Administered 2020-01-22: 1 via OROMUCOSAL
  Filled 2020-01-22: qty 177

## 2020-01-22 MED ORDER — THROMBIN 5000 UNITS EX SOLR
CUTANEOUS | Status: AC
  Filled 2020-01-22: qty 10000

## 2020-01-22 MED ORDER — METHOCARBAMOL 500 MG PO TABS
500.0000 mg | ORAL_TABLET | Freq: Four times a day (QID) | ORAL | Status: DC | PRN
  Administered 2020-01-22 – 2020-01-25 (×6): 500 mg via ORAL
  Filled 2020-01-22 (×7): qty 1

## 2020-01-22 MED ORDER — KETOROLAC TROMETHAMINE 15 MG/ML IJ SOLN
15.0000 mg | Freq: Four times a day (QID) | INTRAMUSCULAR | Status: DC
  Administered 2020-01-22 – 2020-01-24 (×8): 15 mg via INTRAVENOUS
  Filled 2020-01-22 (×8): qty 1

## 2020-01-22 MED ORDER — MORPHINE SULFATE (PF) 2 MG/ML IV SOLN
2.0000 mg | INTRAVENOUS | Status: AC | PRN
  Administered 2020-01-22 (×2): 2 mg via INTRAVENOUS
  Filled 2020-01-22 (×2): qty 1

## 2020-01-22 SURGICAL SUPPLY — 51 items
BAG DECANTER FOR FLEXI CONT (MISCELLANEOUS) ×2 IMPLANT
BASKET BONE COLLECTION (BASKET) IMPLANT
BENZOIN TINCTURE PRP APPL 2/3 (GAUZE/BANDAGES/DRESSINGS) ×2 IMPLANT
BIT DRILL 2.3 12 FIXED (INSTRUMENTS) ×1 IMPLANT
BUR CARBIDE MATCH 3.0 (BURR) ×2 IMPLANT
CANISTER SUCT 3000ML PPV (MISCELLANEOUS) ×2 IMPLANT
CARTRIDGE OIL MAESTRO DRILL (MISCELLANEOUS) IMPLANT
COVER WAND RF STERILE (DRAPES) ×2 IMPLANT
DIFFUSER DRILL AIR PNEUMATIC (MISCELLANEOUS) IMPLANT
DRAPE C-ARM 42X72 X-RAY (DRAPES) ×4 IMPLANT
DRAPE LAPAROTOMY 100X72 PEDS (DRAPES) ×2 IMPLANT
DRAPE MICROSCOPE LEICA (MISCELLANEOUS) ×2 IMPLANT
DRILL 12MM (INSTRUMENTS) ×2
DRSG OPSITE POSTOP 3X4 (GAUZE/BANDAGES/DRESSINGS) ×2 IMPLANT
DURAPREP 6ML APPLICATOR 50/CS (WOUND CARE) ×2 IMPLANT
ELECT COATED BLADE 2.86 ST (ELECTRODE) ×2 IMPLANT
ELECT REM PT RETURN 9FT ADLT (ELECTROSURGICAL) ×2
ELECTRODE REM PT RTRN 9FT ADLT (ELECTROSURGICAL) ×1 IMPLANT
GAUZE 4X4 16PLY RFD (DISPOSABLE) IMPLANT
GLOVE BIO SURGEON STRL SZ7 (GLOVE) IMPLANT
GLOVE BIO SURGEON STRL SZ8 (GLOVE) ×2 IMPLANT
GLOVE BIOGEL PI IND STRL 7.0 (GLOVE) IMPLANT
GLOVE BIOGEL PI INDICATOR 7.0 (GLOVE)
GOWN STRL REUS W/ TWL LRG LVL3 (GOWN DISPOSABLE) IMPLANT
GOWN STRL REUS W/ TWL XL LVL3 (GOWN DISPOSABLE) IMPLANT
GOWN STRL REUS W/TWL 2XL LVL3 (GOWN DISPOSABLE) ×2 IMPLANT
GOWN STRL REUS W/TWL LRG LVL3 (GOWN DISPOSABLE)
GOWN STRL REUS W/TWL XL LVL3 (GOWN DISPOSABLE)
HEMOSTAT POWDER KIT SURGIFOAM (HEMOSTASIS) ×2 IMPLANT
KIT BASIN OR (CUSTOM PROCEDURE TRAY) ×2 IMPLANT
KIT TURNOVER KIT B (KITS) ×2 IMPLANT
NEEDLE HYPO 25X1 1.5 SAFETY (NEEDLE) ×2 IMPLANT
NEEDLE SPNL 20GX3.5 QUINCKE YW (NEEDLE) ×2 IMPLANT
NS IRRIG 1000ML POUR BTL (IV SOLUTION) ×2 IMPLANT
OIL CARTRIDGE MAESTRO DRILL (MISCELLANEOUS)
PACK LAMINECTOMY NEURO (CUSTOM PROCEDURE TRAY) ×2 IMPLANT
PAD ARMBOARD 7.5X6 YLW CONV (MISCELLANEOUS) ×2 IMPLANT
PIN DISTRACTION 14MM (PIN) ×4 IMPLANT
PLATE 14MM (Plate) ×2 IMPLANT
PUTTY BONE 1CC ×2 IMPLANT
RUBBERBAND STERILE (MISCELLANEOUS) ×4 IMPLANT
SCREW 12 (Screw) ×8 IMPLANT
SPACER PTI-C 8X14X12 7D (Spacer) ×2 IMPLANT
SPONGE INTESTINAL PEANUT (DISPOSABLE) ×2 IMPLANT
SPONGE SURGIFOAM ABS GEL SZ50 (HEMOSTASIS) ×2 IMPLANT
STRIP CLOSURE SKIN 1/2X4 (GAUZE/BANDAGES/DRESSINGS) ×2 IMPLANT
SUT VIC AB 3-0 SH 8-18 (SUTURE) ×4 IMPLANT
SUT VICRYL 4-0 PS2 18IN ABS (SUTURE) ×2 IMPLANT
TOWEL GREEN STERILE (TOWEL DISPOSABLE) ×2 IMPLANT
TOWEL GREEN STERILE FF (TOWEL DISPOSABLE) ×2 IMPLANT
WATER STERILE IRR 1000ML POUR (IV SOLUTION) ×2 IMPLANT

## 2020-01-22 NOTE — Anesthesia Postprocedure Evaluation (Signed)
Anesthesia Post Note  Patient: Alexis Fox  Procedure(s) Performed: ACDF - C2-C3 (N/A )     Patient location during evaluation: PACU Anesthesia Type: General Level of consciousness: awake and alert Pain management: pain level controlled Vital Signs Assessment: post-procedure vital signs reviewed and stable Respiratory status: spontaneous breathing, nonlabored ventilation and respiratory function stable Cardiovascular status: blood pressure returned to baseline and stable Postop Assessment: no apparent nausea or vomiting Anesthetic complications: no    Last Vitals:  Vitals:   01/22/20 1030 01/22/20 1058  BP: 113/61 (!) 111/55  Pulse: 67 64  Resp: 20   Temp:  36.7 C  SpO2: 100% 97%    Last Pain:  Vitals:   01/22/20 1102  TempSrc:   PainSc: 3                  Lynda Rainwater

## 2020-01-22 NOTE — Progress Notes (Signed)
Orthopedic Tech Progress Note Patient Details:  Alexis Fox 11/21/1956 [id number]  Ortho Devices Type of Ortho Device: Soft collar Ortho Device/Splint Location: neck Ortho Device/Splint Interventions: Ordered, Application   Post Interventions Patient Tolerated: Well Instructions Provided: Care of device, Adjustment of device   Janit Pagan 01/22/2020, 2:02 PM
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
## 2020-01-22 NOTE — Anesthesia Procedure Notes (Signed)
Procedure Name: Intubation Date/Time: 01/22/2020 8:00 AM Performed by: Trinna Post., CRNA Pre-anesthesia Checklist: Patient identified, Emergency Drugs available, Suction available, Patient being monitored and Timeout performed Patient Re-evaluated:Patient Re-evaluated prior to induction Oxygen Delivery Method: Circle system utilized Preoxygenation: Pre-oxygenation with 100% oxygen Induction Type: IV induction Ventilation: Mask ventilation without difficulty Laryngoscope Size: Mac and 3 Grade View: Grade I Tube type: Oral Tube size: 7.0 mm Number of attempts: 1 Airway Equipment and Method: Stylet Placement Confirmation: ETT inserted through vocal cords under direct vision,  positive ETCO2 and breath sounds checked- equal and bilateral Secured at: 22 cm Tube secured with: Tape Dental Injury: Teeth and Oropharynx as per pre-operative assessment
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
## 2020-01-22 NOTE — Transfer of Care (Signed)
Immediate Anesthesia Transfer of Care Note  Patient: Alexis Fox  Procedure(s) Performed: ACDF - C2-C3 (N/A )  Patient Location: PACU  Anesthesia Type:General  Level of Consciousness: awake, alert  and oriented  Airway & Oxygen Therapy: Patient Spontanous Breathing  Post-op Assessment: Report given to RN and Post -op Vital signs reviewed and stable  Post vital signs: Reviewed and stable  Last Vitals:  Vitals Value Taken Time  BP 132/66 01/22/20 0928  Temp    Pulse 71 01/22/20 0934  Resp 14 01/22/20 0934  SpO2 90 % 01/22/20 0934  Vitals shown include unvalidated device data.  Last Pain:  Vitals:   01/22/20 0630  TempSrc:   PainSc: 0-No pain      Patients Stated Pain Goal: 4 ([id number])  Complications: No apparent anesthesia complications

## 2020-01-23 DIAGNOSIS — M532X2 Spinal instabilities, cervical region: Secondary | ICD-10-CM | POA: Diagnosis not present

## 2020-01-23 MED ORDER — OXYCODONE HCL 5 MG PO TABS
5.0000 mg | ORAL_TABLET | ORAL | Status: DC | PRN
  Administered 2020-01-23: 23:00:00 10 mg via ORAL
  Administered 2020-01-23: 5 mg via ORAL
  Administered 2020-01-23 – 2020-01-24 (×5): 10 mg via ORAL
  Administered 2020-01-25: 5 mg via ORAL
  Administered 2020-01-25: 10 mg via ORAL
  Filled 2020-01-23 (×5): qty 2
  Filled 2020-01-23: qty 1
  Filled 2020-01-23 (×2): qty 2

## 2020-01-23 NOTE — Progress Notes (Signed)
Postop day 1.  Patient overall resting reasonably comfortable.  She complains of throat pain with trouble swallowing.  She is breathing easily.  Her voice is strong.  She is afebrile.  Vital signs are stable.  She is awake and alert.  She is oriented and appropriate.  Motor and sensory function are intact.  Her wound is mildly puffy but there is no evidence of any hematoma or compressive mass.  Her airway is midline.  She is breathing easily.  Status post upper cervical fusion with expected dysphagia.  Continue supportive care.  Possible discharge home tomorrow.

## 2020-01-23 NOTE — Care Management Obs Status (Signed)
[facility] NOTIFICATION   Patient Details  Name: Alexis Fox MRN: [id number] Date of Birth: Apr 16, 1956   Medicare Observation Status Notification Given:  Yes    Claudie Leach, RN 01/23/2020, 5:45 PM

## 2020-01-24 DIAGNOSIS — M532X2 Spinal instabilities, cervical region: Secondary | ICD-10-CM | POA: Diagnosis not present

## 2020-01-24 NOTE — Progress Notes (Signed)
No new issues or problems overnight.  Patient continues to complain of significant dysphagia.  Pain seems to be associated mostly with throat irritation rather than obstruction.  Patient breathing easily.  Voice is strong.  She is awake and alert.  She is oriented and appropriate.  Speech is fluent.  Motor examination 5/5 bilaterally.  Wound clean and dry.  Still with a little bit of submental swelling on the right side but no firmness or collections consistent with hematoma.  Airway midline.  No stridor.  Progressing slowly following upper cervical anterior cervical decompression and fusion.  Continue supportive efforts.  Hopefully throat soreness will gradually improve and patient can be discharged tomorrow.

## 2020-01-25 ENCOUNTER — Encounter: Payer: Self-pay | Admitting: *Deleted

## 2020-01-25 DIAGNOSIS — M532X2 Spinal instabilities, cervical region: Secondary | ICD-10-CM | POA: Diagnosis not present

## 2020-01-25 MED ORDER — OXYCODONE HCL 5 MG PO TABS: 5.0000 mg | ORAL_TABLET | ORAL | 0 refills | Status: DC | PRN

## 2020-01-25 NOTE — Discharge Summary (Signed)
Physician Discharge Summary  Patient ID: ALEASIA FUGE MRN: [id number] DOB/AGE: 08-21-1956 64 y.o.  Admit date: 01/22/2020 Discharge date: 01/25/2020  Admission Diagnoses: C2-3 instability    Discharge Diagnoses: same   Discharged Condition: good  Hospital Course: The patient was admitted on 01/22/2020 and taken to the operating room where the patient underwent ACDF C2-3. The patient tolerated the procedure well and was taken to the recovery room and then to the floor in stable condition. The hospital course was routine. There were no complications. The wound remained clean dry and intact with mild swelling. Dysphagia improved during hospital stay. Pt had appropriate neck soreness. No complaints of arm pain or new N/T/W. The patient remained afebrile with stable vital signs, and tolerated a regular diet. The patient continued to increase activities, and pain was well controlled with oral pain medications.   Consults: None  Significant Diagnostic Studies:  Results for orders placed or performed during the hospital encounter of 01/20/20  SARS CORONAVIRUS 2 (TAT 6-24 HRS) Nasopharyngeal Nasopharyngeal Swab   Specimen: Nasopharyngeal Swab  Result Value Ref Range   SARS Coronavirus 2 NEGATIVE NEGATIVE    Chest 2 View  Result Date: 01/22/2020 CLINICAL DATA:  Preoperative exam for cervical spine surgery. EXAM: CHEST - 2 VIEW COMPARISON:  05/15/2019. FINDINGS: Mediastinum is normal. Calcified left hilar lymph nodes again noted consistent prior granulomas disease. Heart size normal. Lungs are clear of acute infiltrates. Calcified nodule left lung base consistent calcified granuloma. No pleural effusion or pneumothorax. Surgical clips noted over the left chest. No acute bony abnormality. IMPRESSION: No acute cardiopulmonary disease.  Stable exam from 05/15/2019. Electronically Signed   By: Marcello Moores  Register   On: 01/22/2020 06:10   DG Cervical Spine 2 or 3 views  Result Date: 01/22/2020 CLINICAL  DATA:  ACDF of C2-3 EXAM: CERVICAL SPINE - 2-3 VIEW COMPARISON:  None. FINDINGS: Hardware with the associated ACDF C2-3 is been placed, in good position on provided imaging. A disc spacer device is seen as well as anterior plate and screws. IMPRESSION: ACDF C2-3 as above. Electronically Signed   By: Dorise Bullion III M.D   On: 01/22/2020 09:24   DG Foot Complete Left  Result Date: 01/15/2020 Please see detailed radiograph report in office note.  DG C-Arm 1-60 Min  Result Date: 01/22/2020 CLINICAL DATA:  ACDF C2-3 EXAM: DG C-ARM 1-60 MIN FLUOROSCOPY TIME:  Fluoroscopy Time:  24 seconds Number of Acquired Spot Images: 2 COMPARISON:  None. FINDINGS: An anterior plate has been affixed to the cervical spine at C2-3. A disc spacer device is seen at the same level, in good position. IMPRESSION: Images obtained during ACDF of C2-3 as above. Electronically Signed   By: Dorise Bullion III M.D   On: 01/22/2020 09:23    Antibiotics:  Anti-infectives (From admission, onward)   Start     Dose/Rate Route Frequency Ordered Stop   01/22/20 1600  ceFAZolin (ANCEF) IVPB 2g/100 mL premix     2 g 200 mL/hr over 30 Minutes Intravenous Every 8 hours 01/22/20 1047 01/23/20 0815   01/22/20 0729  bacitracin 50,000 Units in sodium chloride 0.9 % 500 mL irrigation  Status:  Discontinued       As needed 01/22/20 0730 01/22/20 0922   01/22/20 0600  ceFAZolin (ANCEF) IVPB 2g/100 mL premix     2 g 200 mL/hr over 30 Minutes Intravenous On call to O.R. 01/22/20 0540 01/22/20 0833      Discharge Exam: Blood pressure (!) 112/59, pulse 62,  temperature 99.1 F (37.3 C), temperature source Oral, resp. rate 18, height 5\' 4"  (1.626 m), weight 92.1 kg, SpO2 100 %. Neurologic: Grossly normal CDI with mild swelling Swallowing thickened liquids well  Discharge Medications:   Allergies as of 01/25/2020      Reactions   Turmeric Diarrhea   Severe diarrhea   Decadron [dexamethasone]    Exacerbates restless leg       Medication List    STOP taking these medications   oxyCODONE-acetaminophen 5-325 MG tablet Commonly known as: PERCOCET/ROXICET     TAKE these medications   buPROPion 150 MG 24 hr tablet Commonly known as: WELLBUTRIN XL Take 150 mg by mouth daily.   cetirizine 10 MG tablet Commonly known as: ZYRTEC Take 10 mg by mouth at bedtime.   citalopram 40 MG tablet Commonly known as: CELEXA Take 40 mg by mouth at bedtime.   clonazePAM 1 MG tablet Commonly known as: KLONOPIN Take 1 mg by mouth at bedtime.   cycloSPORINE 0.05 % ophthalmic emulsion Commonly known as: RESTASIS Place 1 drop into both eyes 2 (two) times daily.   docusate sodium 100 MG capsule Commonly known as: COLACE Take 100 mg by mouth 2 (two) times daily.   Esomeprazole Magnesium 20 MG Tbec Take 20 mg by mouth daily before breakfast.   IRON 100/C PO Take by mouth.   nystatin-triamcinolone ointment Commonly known as: MYCOLOG Apply 1 application topically 2 (two) times daily. What changed:   when to take this  reasons to take this   oxyCODONE 5 MG immediate release tablet Commonly known as: Oxy IR/ROXICODONE Take 1 tablet (5 mg total) by mouth every 4 (four) hours as needed for severe pain ((score 4 to 6)).   pramipexole 0.5 MG tablet Commonly known as: MIRAPEX Take 1/2 tab at 4 pm and 1 tab at night time prn. What changed:   how much to take  how to take this  when to take this  additional instructions   prenatal multivitamin Tabs tablet Take 1 tablet by mouth daily at 12 noon.   VITA-C PO Take 1 tablet by mouth daily at 12 noon.   Vitamin D3 50 MCG (2000 UT) Tabs Take 2,000 Units by mouth daily.   zolpidem 5 MG tablet Commonly known as: AMBIEN Take 5 mg by mouth at bedtime.       Disposition: home   Final Dx: C2-3 ACDF  Discharge Instructions    Call MD for:  difficulty breathing, headache or visual disturbances   Complete by: As directed    Call MD for:  persistant nausea  and vomiting   Complete by: As directed    Call MD for:  redness, tenderness, or signs of infection (pain, swelling, redness, odor or green/yellow discharge around incision site)   Complete by: As directed    Call MD for:  severe uncontrolled pain   Complete by: As directed    Call MD for:  temperature >100.4   Complete by: As directed    Diet - low sodium heart healthy   Complete by: As directed    Increase activity slowly   Complete by: As directed          Signed: Eustace Moore 01/25/2020, 8:02 AM

## 2020-01-25 NOTE — Discharge Instructions (Signed)
Wound Care Leave incision open to air. You may shower. Do not scrub directly on incision.  Do not put any creams, lotions, or ointments on incision. Activity Walk each and every day, increasing distance each day. No lifting greater than 5 lbs.  Avoid excessive neck motion. No driving for 2 weeks; may ride as a passenger locally. If provided soft collar, may wear for comfort unless otherwise instructed. Diet Resume your normal diet.  Return to Work Will be discussed at you follow up appointment. Call Your Doctor If Any of These Occur Redness, drainage, or swelling at the wound.  Temperature greater than 101 degrees. Severe pain not relieved by pain medication. Increased difficulty swallowing. Incision starts to come apart. Follow Up Appt Call today for appointment in 2-4 weeks [id number]) or for problems.  If you have any hardware placed in your spine, you will need an x-ray before your appointment.

## 2020-01-25 NOTE — Progress Notes (Signed)
Patient is discharged from room 3C04 at this time. Alert and in stable condition. IV site d/c'd and instructions read to patient with understanding verbalized. Left unit via wheelchair with all belongings at side. 

## 2020-02-04 DIAGNOSIS — S12121A Other nondisplaced dens fracture, initial encounter for closed fracture: Secondary | ICD-10-CM | POA: Insufficient documentation

## 2020-02-14 ENCOUNTER — Encounter: Payer: Self-pay | Admitting: Adult Health

## 2020-02-16 ENCOUNTER — Other Ambulatory Visit: Payer: Medicare PPO

## 2020-02-16 ENCOUNTER — Inpatient Hospital Stay: Admission: RE | Admit: 2020-02-16 | Payer: Medicare PPO | Source: Ambulatory Visit

## 2020-02-16 ENCOUNTER — Ambulatory Visit (INDEPENDENT_AMBULATORY_CARE_PROVIDER_SITE_OTHER): Payer: Medicare PPO | Admitting: Adult Health

## 2020-02-16 ENCOUNTER — Other Ambulatory Visit: Payer: Self-pay

## 2020-02-16 ENCOUNTER — Encounter: Payer: Self-pay | Admitting: Adult Health

## 2020-02-16 VITALS — BP 124/71 | HR 75 | Temp 97.2°F | Ht 64.0 in | Wt 195.0 lb

## 2020-02-16 DIAGNOSIS — Z9989 Dependence on other enabling machines and devices: Secondary | ICD-10-CM

## 2020-02-16 DIAGNOSIS — G2581 Restless legs syndrome: Secondary | ICD-10-CM | POA: Diagnosis not present

## 2020-02-16 DIAGNOSIS — G4733 Obstructive sleep apnea (adult) (pediatric): Secondary | ICD-10-CM

## 2020-02-16 NOTE — Progress Notes (Signed)
PATIENT: LOWANDA GILNER DOB: [birth year]  REASON FOR VISIT: follow up HISTORY FROM: patient  HISTORY OF PRESENT ILLNESS: Today 02/16/20:  Ms. Ackroyd is a 64 year old female with a history of obstructive sleep apnea on CPAP and restless leg syndrome.  She reports that Mirapex continues to work well for her.  This is currently being prescribed by her PCP.  Her CPAP download indicates that she use her machine 28 out of 30 days for compliance of 93%.  She used her machine greater than 4 hours 24 days for compliance of 80%.  On average she uses her machine 6 hours and 39 minutes.  Her residual AHI is 5.7 on 11 cm of water with EPR of 3.  Leak in the 95th percentile is 23.1 L/min.  She reports that the CPAP is working well for her.    HISTORY 06/18/19:  Ms. Courtney is a 64 year old female with a history of obstructive sleep apnea on CPAP and restless leg syndrome.  She returns today for follow-up.  Her CPAP download indicates that she use her machine 30 out of 30 days for compliance of 100%.  She use her machine greater than 4 hours 28 days for compliance of 93%.  On average she uses her machine 6 hours and 42 minutes.  Her residual AHI is 3.7 on 11 cm of water with EPR of 3.  Her leak in the 95th percentile is 31.3 L/min.  She currently has the nasal pillows but states that she would prefer the dream wear mask.  She states that there was some issue with her DME company that she cannot obtain this.  She states that her restless legs have been bothering her.  She states that she has not gotten much sleep in the last 2 nights.  She is currently taking Mirapex 0.5 mg at bedtime.  She is not taking half a tablet in the afternoon as instructed by Dr. Brett Fairy.  She states that she thought she was supposed to take Klonopin in the afternoons.  She returns today for follow-up.  REVIEW OF SYSTEMS: Out of a complete 14 system review of symptoms, the patient complains only of the following symptoms, and all  other reviewed systems are negative.  See HPI  ALLERGIES: Allergies  Allergen Reactions  . Turmeric Diarrhea    Severe diarrhea  . Decadron [Dexamethasone]     Exacerbates restless leg    HOME MEDICATIONS: Outpatient Medications Prior to Visit  Medication Sig Dispense Refill  . Ascorbic Acid (VITA-C PO) Take 1 tablet by mouth daily at 12 noon.    Marland Kitchen buPROPion (WELLBUTRIN XL) 150 MG 24 hr tablet Take 150 mg by mouth daily.  4  . cetirizine (ZYRTEC) 10 MG tablet Take 10 mg by mouth at bedtime.     . Cholecalciferol (VITAMIN D3) 50 MCG (2000 UT) TABS Take 2,000 Units by mouth daily.     . citalopram (CELEXA) 40 MG tablet Take 40 mg by mouth at bedtime.   3  . clonazePAM (KLONOPIN) 1 MG tablet Take 1 mg by mouth at bedtime.     . cycloSPORINE (RESTASIS) 0.05 % ophthalmic emulsion Place 1 drop into both eyes 2 (two) times daily.    Marland Kitchen docusate sodium (COLACE) 100 MG capsule Take 100 mg by mouth 2 (two) times daily.    . Esomeprazole Magnesium 20 MG TBEC Take 20 mg by mouth daily before breakfast.     . Iron-Vitamin C (IRON 100/C PO) Take by mouth.    Marland Kitchen  nystatin-triamcinolone ointment (MYCOLOG) Apply 1 application topically 2 (two) times daily. (Patient taking differently: Apply 1 application topically daily as needed (skin irritation). ) 60 g 1  . oxyCODONE (OXY IR/ROXICODONE) 5 MG immediate release tablet Take 1 tablet (5 mg total) by mouth every 4 (four) hours as needed for severe pain ((score 4 to 6)). 30 tablet 0  . pramipexole (MIRAPEX) 0.5 MG tablet Take 1/2 tab at 4 pm and 1 tab at night time prn. (Patient taking differently: Take 0.5 mg by mouth at bedtime. ) 135 tablet 3  . Prenatal Vit-Fe Fumarate-FA (PRENATAL MULTIVITAMIN) TABS tablet Take 1 tablet by mouth daily at 12 noon.    Marland Kitchen zolpidem (AMBIEN) 5 MG tablet Take 5 mg by mouth at bedtime.   1   Facility-Administered Medications Prior to Visit  Medication Dose Route Frequency Provider Last Rate Last Admin  . methylPREDNISolone  acetate (DEPO-MEDROL) injection 40 mg  40 mg Intra-articular Once Hilts, Michael, MD      . sodium chloride flush (NS) 0.9 % injection 10 mL  10 mL Intracatheter PRN Nicholas Lose, MD   10 mL at 07/19/16 1434    PAST MEDICAL HISTORY: Past Medical History:  Diagnosis Date  . Anxiety   . Arthritis    knees  . Breast cancer ([place])   . Breast cancer of upper-outer quadrant of left female breast ([place]) 03/20/2016  . C2 cervical fracture ([place])    10/22/18, following fall from ladder  . Cervicalgia   . Cholecystitis   . Complication of anesthesia    BP "crashes" post op  . Depression   . Early cataracts, bilateral   . Family history of brain cancer   . Family history of breast cancer   . GERD (gastroesophageal reflux disease)   . Headache    " from my neck"  . Hot flashes   . Personal history of radiation therapy   . Restless leg syndrome   . Sleep apnea 10/03/2018   wears CPAP    PAST SURGICAL HISTORY: Past Surgical History:  Procedure Laterality Date  . ANTERIOR CERVICAL DECOMP/DISCECTOMY FUSION N/A 01/22/2020   Procedure: ACDF - C2-C3;  Surgeon: Eustace Moore, MD;  Location: [facility];  Service: Neurosurgery;  Laterality: N/A;  . BREAST BIOPSY    . BREAST LUMPECTOMY    . BUNIONECTOMY    . CHOLECYSTECTOMY N/A 10/04/2015   Procedure: LAPAROSCOPIC CHOLECYSTECTOMY WITH INTRAOPERATIVE CHOLANGIOGRAM;  Surgeon: Greer Pickerel, MD;  Location: [facility];  Service: General;  Laterality: N/A;  . ELBOW SURGERY     right for epicondylitis 2010   . FOOT SURGERY     1983 -tarsal tunnel release  . IR GENERIC HISTORICAL  10/26/2016   IR CV LINE INJECTION 10/26/2016 [facility]-INTERV RAD  . LUMBAR DISC SURGERY  04/04/2012  . PORT-A-CATH REMOVAL N/A 11/15/2016   Procedure: REMOVAL PORT-A-CATH;  Surgeon: Rolm Bookbinder, MD;  Location: [facility];  Service: General;  Laterality: N/A;  . PORTACATH PLACEMENT Right 04/02/2016   Procedure: INSERTION PORT-A-CATH WITH [place];  Surgeon: Rolm Bookbinder,  MD;  Location: [facility];  Service: General;  Laterality: Right;  . RADIOACTIVE SEED GUIDED PARTIAL MASTECTOMY/AXILLARY SENTINEL NODE BIOPSY/AXILLARY NODE DISSECTION Left 09/12/2016   Procedure: LEFT BREAST SEED GUIDED LUMPECTOMY, LEFT AXILLARY SENTINEL NODE BIOPSY, LEFT SEED GUIDED AXILLARY NODE EXCISION( TARGETED AXILLARY DISSECTION), BLUE DYE INJECTION;  Surgeon: Rolm Bookbinder, MD;  Location: [facility];  Service: General;  Laterality: Left;  . SPINAL FUSION  5/12  L5-S1  . SPINAL FUSION  05/18/2019   L4-5  . TARSAL TUNNEL RELEASE      FAMILY HISTORY: Family History  Problem Relation Age of Onset  . Heart disease Mother   . Pulmonary fibrosis Mother   . Hypertension Mother   . Cancer Father 79       astocytoma  . Hypertension Brother   . Lymphoma Maternal Aunt   . Anesthesia problems Neg Hx     SOCIAL HISTORY: Social History   Socioeconomic History  . Marital status: Married    Spouse name: Not on file  . Number of children: 2  . Years of education: Not on file  . Highest education level: Not on file  Occupational History  . Not on file  Tobacco Use  . Smoking status: Never Smoker  . Smokeless tobacco: Never Used  Substance and Sexual Activity  . Alcohol use: Yes    Alcohol/week: 1.0 standard drinks    Types: 1 Cans of beer per week    Comment: occasional  . Drug use: No  . Sexual activity: Not Currently    Birth control/protection: Post-menopausal  Other Topics Concern  . Not on file  Social History Narrative  . Not on file   Social Determinants of Health   Financial Resource Strain:   . Difficulty of Paying Living Expenses:   Food Insecurity:   . Worried About Charity fundraiser in the Last Year:   . Arboriculturist in the Last Year:   Transportation Needs:   . Film/video editor (Medical):   Marland Kitchen Lack of Transportation (Non-Medical):   Physical Activity:   . Days of Exercise per Week:   . Minutes of Exercise per  Session:   Stress:   . Feeling of Stress :   Social Connections:   . Frequency of Communication with Friends and Family:   . Frequency of Social Gatherings with Friends and Family:   . Attends Religious Services:   . Active Member of Clubs or Organizations:   . Attends Archivist Meetings:   Marland Kitchen Marital Status:   Intimate Partner Violence:   . Fear of Current or Ex-Partner:   . Emotionally Abused:   Marland Kitchen Physically Abused:   . Sexually Abused:       PHYSICAL EXAM  Vitals:   02/16/20 0830  BP: 124/71  Pulse: 75  Temp: (!) 97.2 F (36.2 C)  Weight: 195 lb (88.5 kg)  Height: 5\' 4"  (1.626 m)   Body mass index is 33.47 kg/m.  Generalized: Well developed, in no acute distress  Chest: Lungs clear to auscultation bilaterally  Neurological examination  Mentation: Alert oriented to time, place, history taking. Follows all commands speech and language fluent Cranial nerve II-XII: Extraocular movements were full, visual field were full on confrontational test Head turning and shoulder shrug  were normal and symmetric. Motor: The motor testing reveals 5 over 5 strength of all 4 extremities. Good symmetric motor tone is noted throughout.  Sensory: Sensory testing is intact to soft touch on all 4 extremities. No evidence of extinction is noted.  Gait and station: Gait is normal.    DIAGNOSTIC DATA (LABS, IMAGING, TESTING) - I reviewed patient records, labs, notes, testing and imaging myself where available.  Lab Results  Component Value Date   WBC 5.8 01/20/2020   HGB 14.6 01/20/2020   HCT 47.2 (H) 01/20/2020   MCV 101.1 (H) 01/20/2020   PLT 244 01/20/2020  Component Value Date/Time   NA 139 01/20/2020 1053   NA 139 05/27/2017 1126   K 4.5 01/20/2020 1053   K 4.0 05/27/2017 1126   CL 102 01/20/2020 1053   CO2 27 01/20/2020 1053   CO2 28 05/27/2017 1126   GLUCOSE 92 01/20/2020 1053   GLUCOSE 92 05/27/2017 1126   BUN 15 01/20/2020 1053   BUN 17.9 05/27/2017  1126   CREATININE 0.97 01/20/2020 1053   CREATININE 1.0 05/27/2017 1126   CALCIUM 10.0 01/20/2020 1053   CALCIUM 10.0 05/27/2017 1126   PROT 6.9 09/15/2018 1310   PROT 7.4 05/27/2017 1126   ALBUMIN 3.5 01/02/2018 1220   ALBUMIN 3.9 05/27/2017 1126   AST 15 01/02/2018 1220   AST 28 05/27/2017 1126   ALT 17 01/02/2018 1220   ALT 52 05/27/2017 1126   ALKPHOS 73 01/02/2018 1220   ALKPHOS 69 05/27/2017 1126   BILITOT 0.4 01/02/2018 1220   BILITOT 0.85 05/27/2017 1126   GFRNONAA >60 01/20/2020 1053   GFRAA >60 01/20/2020 1053    Lab Results  Component Value Date   TSH 2.680 09/15/2018      ASSESSMENT AND PLAN 64 y.o. year old female  has a past medical history of Anxiety, Arthritis, Breast cancer ([place]), Breast cancer of upper-outer quadrant of left female breast ([place]) (03/20/2016), C2 cervical fracture ([place]), Cervicalgia, Cholecystitis, Complication of anesthesia, Depression, Early cataracts, bilateral, Family history of brain cancer, Family history of breast cancer, GERD (gastroesophageal reflux disease), Headache, Hot flashes, Personal history of radiation therapy, Restless leg syndrome, and Sleep apnea (10/03/2018). here with:  1. OSA on CPAP  - CPAP compliance excellent -Residual AHI is slightly elevated.  I will increase pressure to 12 cm of water - Encourage patient to use CPAP nightly and > 4 hours each night -Continue Mirapex - F/U in 1 year or sooner if needed   I spent 15 minutes of face-to-face and non-face-to-face time with patient.  This included previsit chart review, lab review, study review, order entry, electronic health record documentation, patient education.  Ward Givens, MSN, NP-C 02/16/2020, 9:13 AM [facility] [address] [place],  [address] [phone]

## 2020-02-16 NOTE — Patient Instructions (Addendum)
Your Plan:  Continue CPAP Continue Mirapex If your symptoms worsen or you develop new symptoms please let us know.   Thank you for coming to see [place] at [facility]. I hope we have been able to provide you high quality care today.  You may receive a patient satisfaction survey over the next few weeks. We would appreciate your feedback and comments so that we may continue to improve ourselves and the health of our patients.

## 2020-02-16 NOTE — Progress Notes (Signed)
Order for cpap pressure change sent to [facility] via community message. Confirmation received that the order transmitted was successful.  

## 2020-02-29 ENCOUNTER — Other Ambulatory Visit: Payer: Self-pay | Admitting: Neurological Surgery

## 2020-02-29 DIAGNOSIS — M533 Sacrococcygeal disorders, not elsewhere classified: Secondary | ICD-10-CM

## 2020-03-07 ENCOUNTER — Other Ambulatory Visit: Payer: Self-pay | Admitting: Hematology and Oncology

## 2020-03-07 DIAGNOSIS — Z853 Personal history of malignant neoplasm of breast: Secondary | ICD-10-CM

## 2020-03-15 ENCOUNTER — Ambulatory Visit
Admission: RE | Admit: 2020-03-15 | Discharge: 2020-03-15 | Disposition: A | Discharge status: Home / Self Care | Payer: Medicare PPO | Source: Ambulatory Visit | Attending: Neurological Surgery | Admitting: Neurological Surgery

## 2020-03-15 ENCOUNTER — Other Ambulatory Visit: Payer: Self-pay

## 2020-03-15 DIAGNOSIS — M533 Sacrococcygeal disorders, not elsewhere classified: Secondary | ICD-10-CM

## 2020-03-24 ENCOUNTER — Ambulatory Visit: Payer: Medicare PPO | Admitting: Sports Medicine

## 2020-04-01 ENCOUNTER — Other Ambulatory Visit: Payer: Self-pay

## 2020-04-01 ENCOUNTER — Ambulatory Visit: Payer: Medicare PPO | Admitting: Sports Medicine

## 2020-04-01 ENCOUNTER — Encounter: Payer: Self-pay | Admitting: Sports Medicine

## 2020-04-01 DIAGNOSIS — M79672 Pain in left foot: Secondary | ICD-10-CM

## 2020-04-01 DIAGNOSIS — G5761 Lesion of plantar nerve, right lower limb: Secondary | ICD-10-CM | POA: Diagnosis not present

## 2020-04-01 DIAGNOSIS — D3610 Benign neoplasm of peripheral nerves and autonomic nervous system, unspecified: Secondary | ICD-10-CM

## 2020-04-01 DIAGNOSIS — L905 Scar conditions and fibrosis of skin: Secondary | ICD-10-CM

## 2020-04-01 DIAGNOSIS — M779 Enthesopathy, unspecified: Secondary | ICD-10-CM

## 2020-04-01 NOTE — Progress Notes (Signed)
Subjective: Alexis Fox is a 64 y.o. female patient who returns to office for follow-up evaluation of left foot at the ball especially at 2-3 toes with sharp pains, reports that pain is about the same with no change goes from 0-6 out of 10 sharp in nature.  Patient denies any other acute changes or pedal complaints at this time.  Patient passively mentions that her toes have never felt right since she had surgery on her metatarsal head many years ago.  Patient Active Problem List   Diagnosis Date Noted  . S/P cervical spinal fusion 01/22/2020  . Memory deficit 01/14/2020  . Unilateral primary osteoarthritis, right hip 09/01/2019  . Trochanteric bursitis, left hip 07/14/2019  . S/P lumbar fusion 05/18/2019  . OSA (obstructive sleep apnea) 10/28/2018  . Pain managed using patient-controlled analgesia (PCA) 10/25/2018  . Fall 10/24/2018  . DDD (degenerative disc disease), lumbosacral 09/15/2018  . 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 Central line complication 49/44/9675  . Genetic testing 05/15/2016  . Family history of breast cancer   . Family history of brain cancer   . Breast cancer of upper-outer quadrant of left female breast ([place]) 03/20/2016  . Transaminitis 10/03/2015  . Symptomatic cholelithiasis 10/03/2015  . RUQ abdominal pain   . Depression 06/24/2013  . RLS (restless legs syndrome) 06/24/2013    Current Outpatient Medications on File Prior to Visit  Medication Sig Dispense Refill  . Ascorbic Acid (VITA-C PO) Take 1 tablet by mouth daily at 12 noon.    Marland Kitchen buPROPion (WELLBUTRIN XL) 150 MG 24 hr tablet Take 150 mg by mouth daily.  4  . cetirizine (ZYRTEC) 10 MG tablet Take 10 mg by mouth at bedtime.     . Cholecalciferol (VITAMIN D3) 50 MCG (2000 UT) TABS Take 2,000 Units by mouth daily.     . citalopram (CELEXA) 40 MG tablet Take 40 mg by mouth at bedtime.   3  . clonazePAM (KLONOPIN) 1 MG tablet Take 1 mg by mouth at bedtime.     . cycloSPORINE (RESTASIS)  0.05 % ophthalmic emulsion Place 1 drop into both eyes 2 (two) times daily.    Marland Kitchen docusate sodium (COLACE) 100 MG capsule Take 100 mg by mouth 2 (two) times daily.    . Esomeprazole Magnesium 20 MG TBEC Take 20 mg by mouth daily before breakfast.     . Iron-Vitamin C (IRON 100/C PO) Take by mouth.    . nystatin-triamcinolone ointment (MYCOLOG) Apply 1 application topically 2 (two) times daily. (Patient taking differently: Apply 1 application topically daily as needed (skin irritation). ) 60 g 1  . oxyCODONE (OXY IR/ROXICODONE) 5 MG immediate release tablet Take 1 tablet (5 mg total) by mouth every 4 (four) hours as needed for severe pain ((score 4 to 6)). 30 tablet 0  . pramipexole (MIRAPEX) 0.5 MG tablet Take 1/2 tab at 4 pm and 1 tab at night time prn. (Patient taking differently: Take 0.5 mg by mouth at bedtime. ) 135 tablet 3  . Prenatal Vit-Fe Fumarate-FA (PRENATAL MULTIVITAMIN) TABS tablet Take 1 tablet by mouth daily at 12 noon.    Marland Kitchen zolpidem (AMBIEN) 5 MG tablet Take 5 mg by mouth at bedtime.   1   Current Facility-Administered Medications on File Prior to Visit  Medication Dose Route Frequency Provider Last Rate Last Admin  . methylPREDNISolone acetate (DEPO-MEDROL) injection 40 mg  40 mg Intra-articular Once Hilts, Michael, MD      . sodium chloride flush (NS) 0.9 %  injection 10 mL  10 mL Intracatheter PRN Nicholas Lose, MD   10 mL at 07/19/16 1434    Allergies  Allergen Reactions  . Turmeric Diarrhea    Severe diarrhea  . Decadron [Dexamethasone]     Exacerbates restless leg    Objective:  General: Alert and oriented x3 in no acute distress  Dermatology: Old surgical scars well healed on left, No open lesions bilateral lower extremities, no webspace macerations, no ecchymosis bilateral, all nails x 10 are well manicured.  Vascular: Dorsalis Pedis and Posterior Tibial pedal pulses palpable, Capillary Fill Time 3 seconds,(+) pedal hair growth bilateral, no edema bilateral lower  extremities, Temperature gradient within normal limits.  Neurology: Gross sensation intact via light touch bilateral,Numbness and tingling to Left 2-3 toes, negative moulders sign but the met heads are in close proximity like before.   Musculoskeletal: Mild tenderness with palpation at Left 2nd interspace of the left foot and with palpation to ball of left foot. Strength within normal limits, no other acute findings.    Assessment and Plan: Problem List Items Addressed This Visit    None    Visit Diagnoses    Neuroma    -  Primary   Capsulitis       Left foot pain       Scar tissue           -Complete examination performed -Re-Discussed treatment options for neuroma and capsulitis with likely scar tissue involvement from previous surgery -After oral consent and aseptic prep, injected a mixture containing 1 ml of 2%  plain lidocaine, 1 ml 0.5% plain marcaine, 0.5 ml of Celestone and 0.5 ml of dexamethasone phosphate into left 2nd interspace without complication. Post-injection care discussed with patient.  Advised patient that we may have to switch to using this injection or alcohol sclerosing treatments on a schedule or series if there is some improvements; advised patient to keep a pain journal -Continue with good supportive shoes and dispensed a metatarsal padding for patient to use as instructed -Patient to return to office in 2 to 3 weeks or sooner if condition worsens.  Landis Martins, DPM

## 2020-04-05 DIAGNOSIS — M533 Sacrococcygeal disorders, not elsewhere classified: Secondary | ICD-10-CM | POA: Diagnosis not present

## 2020-04-07 ENCOUNTER — Ambulatory Visit
Admission: RE | Admit: 2020-04-07 | Discharge: 2020-04-07 | Disposition: A | Discharge status: Home / Self Care | Payer: Medicare PPO | Source: Ambulatory Visit | Attending: Hematology and Oncology | Admitting: Hematology and Oncology

## 2020-04-07 ENCOUNTER — Other Ambulatory Visit: Payer: Self-pay

## 2020-04-07 DIAGNOSIS — Z853 Personal history of malignant neoplasm of breast: Secondary | ICD-10-CM

## 2020-04-12 ENCOUNTER — Other Ambulatory Visit: Payer: Self-pay | Admitting: Orthopedic Surgery

## 2020-04-15 ENCOUNTER — Ambulatory Visit: Payer: Medicare PPO | Admitting: Sports Medicine

## 2020-04-19 DIAGNOSIS — G4733 Obstructive sleep apnea (adult) (pediatric): Secondary | ICD-10-CM | POA: Diagnosis not present

## 2020-04-25 ENCOUNTER — Ambulatory Visit
Admission: RE | Admit: 2020-04-25 | Discharge: 2020-04-25 | Disposition: A | Discharge status: Home / Self Care | Payer: Medicare PPO | Source: Ambulatory Visit | Attending: Hematology and Oncology | Admitting: Hematology and Oncology

## 2020-04-25 ENCOUNTER — Other Ambulatory Visit: Payer: Self-pay

## 2020-04-25 DIAGNOSIS — R928 Other abnormal and inconclusive findings on diagnostic imaging of breast: Secondary | ICD-10-CM | POA: Diagnosis not present

## 2020-04-25 DIAGNOSIS — Z853 Personal history of malignant neoplasm of breast: Secondary | ICD-10-CM | POA: Diagnosis not present

## 2020-04-25 HISTORY — DX: Personal history of antineoplastic chemotherapy: Z92.21

## 2020-05-09 NOTE — Pre-Procedure Instructions (Addendum)
[id number] - [place], [place] - [address] [place] [address] Phone: [phone] Fax: [phone]      Your procedure is scheduled on Wednesday, June 16th.  Report to [facility] Main Entrance "A" at 10:15 A.M., and check in at the Admitting office.  Call this number if you have problems the morning of surgery:  [phone]  Call [phone] if you have any questions prior to your surgery date Monday-Friday 8am-4pm    Remember:  Do not eat after midnight the night before your surgery  You may drink clear liquids until 10:15 the morning of your surgery.   Clear liquids allowed are: Water, Non-Citrus Juices (without pulp), Carbonated Beverages, Clear Tea, Black Coffee Only, and Gatorade.   Enhanced Recovery after Surgery for Orthopedics Enhanced Recovery after Surgery is a protocol used to improve the stress on your body and your recovery after surgery.  Patient Instructions  . The night before surgery:  o No food after midnight. ONLY clear liquids after midnight  .  Marland Kitchen The day of surgery (if you do NOT have diabetes):  o Drink ONE (1) Pre-Surgery Clear Ensure as directed.   o This drink was given to you during your hospital  pre-op appointment visit. o Please complete drink by 10:15 a.m. o Nothing else to drink after completing the  Pre-Surgery Clear Ensure.          If you have questions, please contact your surgeon's office.     Take these medicines the morning of surgery with A SIP OF WATER  buPROPion (WELLBUTRIN XL)  cycloSPORINE (RESTASIS)   As of today, STOP taking any Aspirin (unless otherwise instructed by your surgeon) and Aspirin containing products, Aleve, Naproxen, Ibuprofen, Motrin, Advil, Goody's, BC's, all herbal medications, fish oil, and all vitamins.                      Do not wear jewelry, make up, or nail polish            Do not wear lotions, powders, perfumes, or deodorant.            Do not shave 48 hours  prior to surgery.             Do not bring valuables to the hospital.            [facility] is not responsible for any belongings or valuables.  Do NOT Smoke (Tobacco/Vaping) or drink Alcohol 24 hours prior to your procedure If you use a CPAP at night, you may bring all equipment for your overnight stay.   Contacts, glasses, dentures or bridgework may not be worn into surgery.      For patients admitted to the hospital, discharge time will be determined by your treatment team.   Patients discharged the day of surgery will not be allowed to drive home, and someone needs to stay with them for 24 hours.    Special instructions:   [facility]- Preparing For Surgery  Before surgery, you can play an important role. Because skin is not sterile, your skin needs to be as free of germs as possible. You can reduce the number of germs on your skin by washing with CHG (chlorahexidine gluconate) Soap before surgery.  CHG is an antiseptic cleaner which kills germs and bonds with the skin to continue killing germs even after washing.    Oral Hygiene is also important to reduce your risk of infection.  Remember - BRUSH YOUR TEETH THE MORNING OF SURGERY WITH YOUR REGULAR TOOTHPASTE  Please do not use if you have an allergy to CHG or antibacterial soaps. If your skin becomes reddened/irritated stop using the CHG.  Do not shave (including legs and underarms) for at least 48 hours prior to first CHG shower. It is OK to shave your face.  Please follow these instructions carefully.   1. Shower the NIGHT BEFORE SURGERY and the MORNING OF SURGERY with CHG Soap.   2. If you chose to wash your hair, wash your hair first as usual with your normal shampoo.  3. After you shampoo, rinse your hair and body thoroughly to remove the shampoo.  4. Use CHG as you would any other liquid soap. You can apply CHG directly to the skin and wash gently with a scrungie or a clean washcloth.   5. Apply the CHG Soap to your  body ONLY FROM THE NECK DOWN.  Do not use on open wounds or open sores. Avoid contact with your eyes, ears, mouth and genitals (private parts). Wash Face and genitals (private parts)  with your normal soap.   6. Wash thoroughly, paying special attention to the area where your surgery will be performed.  7. Thoroughly rinse your body with warm water from the neck down.  8. DO NOT shower/wash with your normal soap after using and rinsing off the CHG Soap.  9. Pat yourself dry with a CLEAN TOWEL.  10. Wear CLEAN PAJAMAS to bed the night before surgery, wear comfortable clothes the morning of surgery  11. Place CLEAN SHEETS on your bed the night of your first shower and DO NOT SLEEP WITH PETS.   Day of Surgery:   Do not apply any deodorants/lotions.  Please wear clean clothes to the hospital/surgery center.   Remember to brush your teeth WITH YOUR REGULAR TOOTHPASTE.   Please read over the following fact sheets that you were given.

## 2020-05-10 ENCOUNTER — Encounter (HOSPITAL_COMMUNITY): Payer: Self-pay

## 2020-05-10 ENCOUNTER — Encounter (HOSPITAL_COMMUNITY)
Admission: RE | Admit: 2020-05-10 | Discharge: 2020-05-10 | Disposition: A | Discharge status: Home / Self Care | Payer: Medicare PPO | Source: Ambulatory Visit | Attending: Orthopedic Surgery | Admitting: Orthopedic Surgery

## 2020-05-10 ENCOUNTER — Other Ambulatory Visit: Payer: Self-pay

## 2020-05-10 DIAGNOSIS — Z01812 Encounter for preprocedural laboratory examination: Secondary | ICD-10-CM | POA: Diagnosis not present

## 2020-05-10 LAB — URINALYSIS, ROUTINE W REFLEX MICROSCOPIC
Bilirubin Urine: NEGATIVE
Glucose, UA: NEGATIVE mg/dL
Hgb urine dipstick: NEGATIVE
Ketones, ur: NEGATIVE mg/dL
Leukocytes,Ua: NEGATIVE
Nitrite: NEGATIVE
Protein, ur: NEGATIVE mg/dL
Specific Gravity, Urine: >1.03 — ABNORMAL HIGH (ref 1.005–1.030)
pH: 5 (ref 5.0–8.0)

## 2020-05-10 LAB — COMPREHENSIVE METABOLIC PANEL WITH GFR
ALT: 20 U/L (ref 0–44)
AST: 22 U/L (ref 15–41)
Albumin: 3.6 g/dL (ref 3.5–5.0)
Alkaline Phosphatase: 68 U/L (ref 38–126)
Anion gap: 12 (ref 5–15)
BUN: 20 mg/dL (ref 8–23)
CO2: 26 mmol/L (ref 22–32)
Calcium: 9.5 mg/dL (ref 8.9–10.3)
Chloride: 108 mmol/L (ref 98–111)
Creatinine, Ser: 0.83 mg/dL (ref 0.44–1.00)
GFR calc Af Amer: >60 mL/min (ref >60)
GFR calc non Af Amer: >60 mL/min (ref >60)
Glucose, Bld: 91 mg/dL (ref 70–99)
Potassium: 4 mmol/L (ref 3.5–5.1)
Sodium: 146 mmol/L — ABNORMAL HIGH (ref 135–145)
Total Bilirubin: 0.5 mg/dL (ref 0.3–1.2)
Total Protein: 6.7 g/dL (ref 6.5–8.1)

## 2020-05-10 LAB — CBC WITH DIFFERENTIAL/PLATELET
Abs Immature Granulocytes: 0.03 K/uL (ref 0.00–0.07)
Basophils Absolute: 0 K/uL (ref 0.0–0.1)
Basophils Relative: 1 %
Eosinophils Absolute: 0.6 K/uL — ABNORMAL HIGH (ref 0.0–0.5)
Eosinophils Relative: 10 %
HCT: 40.2 % (ref 36.0–46.0)
Hemoglobin: 12.6 g/dL (ref 12.0–15.0)
Immature Granulocytes: 1 %
Lymphocytes Relative: 27 %
Lymphs Abs: 1.7 K/uL (ref 0.7–4.0)
MCH: 30.1 pg (ref 26.0–34.0)
MCHC: 31.3 g/dL (ref 30.0–36.0)
MCV: 95.9 fL (ref 80.0–100.0)
Monocytes Absolute: 0.6 K/uL (ref 0.1–1.0)
Monocytes Relative: 10 %
Neutro Abs: 3.2 K/uL (ref 1.7–7.7)
Neutrophils Relative %: 51 %
Platelets: 228 K/uL (ref 150–400)
RBC: 4.19 MIL/uL (ref 3.87–5.11)
RDW: 13.7 % (ref 11.5–15.5)
WBC: 6.2 K/uL (ref 4.0–10.5)
nRBC: 0 % (ref 0.0–0.2)

## 2020-05-10 LAB — PROTIME-INR
INR: 1 (ref 0.8–1.2)
Prothrombin Time: 12.6 seconds (ref 11.4–15.2)

## 2020-05-10 LAB — APTT: aPTT: 27 s (ref 24–36)

## 2020-05-10 NOTE — Progress Notes (Signed)
PCP - DR Electa Sniff Cardiologist -NA     -  Chest x-ray - 2/21 EKG - 2/21 Stress Test - NA ECHO - 6/17 Cardiac Cath - NA  Sleep Study -6/14 CPAP - YES   Blood Thinner Instructions:NA Aspirin Instructions: NA ERAS Protcol -INSTRUCTION GIVEN    PRE-SURGERY Ensure or G2-  DRINK ALSO GIVEN TO PT COVID TEST- FOR Monday [facility] E14TH   Anesthesia review: EKG  Patient denies shortness of breath, fever, cough and chest pain at PAT appointment   All instructions explained to the patient, with a verbal understanding of the material. Patient agrees to go over the instructions while at home for a better understanding. Patient also instructed to self quarantine after being tested for COVID-19. The opportunity to ask questions was provided.

## 2020-05-11 NOTE — Progress Notes (Signed)
Anesthesia Chart Review:  History of left breast cancer s/p chemo and radiation, followed by Dr. Lindi Adie.   OSA on CPAP.  She reports issues with post-operative hypotension. No periop complications noted with lumbar fusion 05/19/2019 or C2-3 ACDF 01/22/2020.  She fell from a ladder on 10/22/18 and sustained a nondisplaced fractures through the posterior arch/bilateral lamina of C1, complicated fracture of C2.   She was initially treated conservatively. 12/24/18 CTA neck showed healing distal right vertebral artery minimal intimal injury without pseudoaneurysm, dissection flap, or stenosis. 04/29/19 CT c-spine showed nearly healed C2 fracture with unchangedtrace anterolisthesis at C2-C3.  Subsequently underwent C2-3 ACDF 01/22/2020 for instability.  Preop labs reviewed, unremarkable.   EKG 05/15/19: Normal sinus rhythm. Rate 66. Incomplete right bundle branch block  TTE 05/14/16: - Left ventricle: The cavity size was normal. Wall thickness was  normal. Systolic function was normal. The estimated ejection  fraction was in the range of 60% to 65%. Wall motion was normal;  there were no regional wall motion abnormalities. Features are  consistent with a pseudonormal left ventricular filling pattern,  with concomitant abnormal relaxation and increased filling  pressure (grade 2 diastolic dysfunction).  - Pulmonary arteries: PA peak pressure: 33 mm Hg (S).  - Impressions: Lateral s&' = 10.5 cm/sec. GLS -23.4%    Alexis Fox [facility]/Anesthesiology Phone [phone] 05/11/2020 4:04 PM

## 2020-05-11 NOTE — Anesthesia Preprocedure Evaluation (Addendum)
Anesthesia Evaluation  Patient identified by MRN, date of birth, ID band Patient awake    Reviewed: Allergy & Precautions, NPO status , Patient's Chart, lab work & pertinent test results  History of Anesthesia Complications (+) history of anesthetic complications (low BP)  Airway Mallampati: II  TM Distance: >3 FB Neck ROM: Limited    Dental  (+) Teeth Intact, Dental Advisory Given   Pulmonary sleep apnea and Continuous Positive Airway Pressure Ventilation ,    Pulmonary exam normal breath sounds clear to auscultation       Cardiovascular (-) angina(-) Past MI and (-) Cardiac Stents Normal cardiovascular exam+ dysrhythmias (Incomplete RBBB)  Rhythm:Regular Rate:Normal     Neuro/Psych  Headaches, PSYCHIATRIC DISORDERS Anxiety Depression S/p ACDF   Neuromuscular disease    GI/Hepatic Neg liver ROS, GERD  Medicated and Controlled,  Endo/Other  negative endocrine ROSObesity   Renal/GU negative Renal ROS     Musculoskeletal  (+) Arthritis , SACROILIAC JOINT DYSFUNCTION   Abdominal   Peds  Hematology negative hematology ROS (+)   Anesthesia Other Findings Day of surgery medications reviewed with the patient.  Reproductive/Obstetrics                             Anesthesia Physical Anesthesia Plan  ASA: III  Anesthesia Plan: General   Post-op Pain Management:    Induction: Intravenous  PONV Risk Score and Plan: 3 and Midazolam, Dexamethasone and Ondansetron  Airway Management Planned: Oral ETT and Video Laryngoscope Planned  Additional Equipment:   Intra-op Plan:   Post-operative Plan: Extubation in OR  Informed Consent: I have reviewed the patients History and Physical, chart, labs and discussed the procedure including the risks, benefits and alternatives for the proposed anesthesia with the patient or authorized representative who has indicated his/her understanding and  acceptance.     Dental advisory given  Plan Discussed with: CRNA  Anesthesia Plan Comments: (PAT note by Alexis Caldwell, PA-C: History of left breast cancer s/p chemo and radiation, followed by Dr. Lindi Adie.   OSA on CPAP.  She reports issues with post-operative hypotension. No periop complications noted with lumbar fusion 05/19/2019 or C2-3 ACDF 01/22/2020.  She fell from a ladder on 10/22/18 and sustained a nondisplaced fractures through the posterior arch/bilateral lamina of C1, complicated fracture of C2.   She was initially treated conservatively. 12/24/18 CTA neck showed healing distal right vertebral artery minimal intimal injury without pseudoaneurysm, dissection flap, or stenosis. 04/29/19 CT c-spine showed nearly healed C2 fracture with unchangedtrace anterolisthesis at C2-C3.  Subsequently underwent C2-3 ACDF 01/22/2020 for instability.  Preop labs reviewed, unremarkable.   EKG 05/15/19: Normal sinus rhythm. Rate 66. Incomplete right bundle branch block  TTE 05/14/16: - Left ventricle: The cavity size was normal. Wall thickness was  normal. Systolic function was normal. The estimated ejection  fraction was in the range of 60% to 65%. Wall motion was normal;  there were no regional wall motion abnormalities. Features are  consistent with a pseudonormal left ventricular filling pattern,  with concomitant abnormal relaxation and increased filling  pressure (grade 2 diastolic dysfunction).  - Pulmonary arteries: PA peak pressure: 33 mm Hg (S).  - Impressions: Lateral s&' = 10.5 cm/sec. GLS -23.4% )       Anesthesia Quick Evaluation

## 2020-05-14 ENCOUNTER — Other Ambulatory Visit (HOSPITAL_COMMUNITY): Payer: Medicare PPO

## 2020-05-16 ENCOUNTER — Other Ambulatory Visit (HOSPITAL_COMMUNITY)
Admission: RE | Admit: 2020-05-16 | Discharge: 2020-05-16 | Disposition: A | Discharge status: Home / Self Care | Payer: Medicare PPO | Source: Ambulatory Visit | Attending: Orthopedic Surgery | Admitting: Orthopedic Surgery

## 2020-05-16 DIAGNOSIS — Z20822 Contact with and (suspected) exposure to covid-19: Secondary | ICD-10-CM | POA: Insufficient documentation

## 2020-05-16 DIAGNOSIS — Z01812 Encounter for preprocedural laboratory examination: Secondary | ICD-10-CM | POA: Diagnosis not present

## 2020-05-16 LAB — SARS CORONAVIRUS 2 (TAT 6-24 HRS): SARS Coronavirus 2: NEGATIVE

## 2020-05-18 ENCOUNTER — Ambulatory Visit (HOSPITAL_COMMUNITY)
Admission: RE | Admit: 2020-05-18 | Discharge: 2020-05-18 | Disposition: A | Discharge status: Home / Self Care | Payer: Medicare PPO | Attending: Orthopedic Surgery | Admitting: Orthopedic Surgery

## 2020-05-18 ENCOUNTER — Ambulatory Visit (HOSPITAL_COMMUNITY): Payer: Medicare PPO | Admitting: Physician Assistant

## 2020-05-18 ENCOUNTER — Other Ambulatory Visit: Payer: Self-pay

## 2020-05-18 ENCOUNTER — Encounter (HOSPITAL_COMMUNITY): Payer: Self-pay | Admitting: Orthopedic Surgery

## 2020-05-18 ENCOUNTER — Encounter (HOSPITAL_COMMUNITY)
Admission: RE | Disposition: A | Discharge status: Home / Self Care | Payer: Self-pay | Source: Home / Self Care | Attending: Orthopedic Surgery

## 2020-05-18 ENCOUNTER — Ambulatory Visit (HOSPITAL_COMMUNITY): Payer: Medicare PPO | Admitting: Anesthesiology

## 2020-05-18 ENCOUNTER — Ambulatory Visit (HOSPITAL_COMMUNITY): Payer: Medicare PPO

## 2020-05-18 DIAGNOSIS — G4730 Sleep apnea, unspecified: Secondary | ICD-10-CM | POA: Insufficient documentation

## 2020-05-18 DIAGNOSIS — Z79899 Other long term (current) drug therapy: Secondary | ICD-10-CM | POA: Diagnosis not present

## 2020-05-18 DIAGNOSIS — G4733 Obstructive sleep apnea (adult) (pediatric): Secondary | ICD-10-CM | POA: Diagnosis not present

## 2020-05-18 DIAGNOSIS — G2581 Restless legs syndrome: Secondary | ICD-10-CM | POA: Diagnosis not present

## 2020-05-18 DIAGNOSIS — Z853 Personal history of malignant neoplasm of breast: Secondary | ICD-10-CM | POA: Insufficient documentation

## 2020-05-18 DIAGNOSIS — Z923 Personal history of irradiation: Secondary | ICD-10-CM | POA: Diagnosis not present

## 2020-05-18 DIAGNOSIS — Z9221 Personal history of antineoplastic chemotherapy: Secondary | ICD-10-CM | POA: Insufficient documentation

## 2020-05-18 DIAGNOSIS — F329 Major depressive disorder, single episode, unspecified: Secondary | ICD-10-CM | POA: Diagnosis not present

## 2020-05-18 DIAGNOSIS — M533 Sacrococcygeal disorders, not elsewhere classified: Secondary | ICD-10-CM | POA: Diagnosis not present

## 2020-05-18 DIAGNOSIS — M1611 Unilateral primary osteoarthritis, right hip: Secondary | ICD-10-CM | POA: Diagnosis not present

## 2020-05-18 DIAGNOSIS — F419 Anxiety disorder, unspecified: Secondary | ICD-10-CM | POA: Diagnosis not present

## 2020-05-18 DIAGNOSIS — M1990 Unspecified osteoarthritis, unspecified site: Secondary | ICD-10-CM | POA: Insufficient documentation

## 2020-05-18 DIAGNOSIS — Z981 Arthrodesis status: Secondary | ICD-10-CM | POA: Diagnosis not present

## 2020-05-18 DIAGNOSIS — Z419 Encounter for procedure for purposes other than remedying health state, unspecified: Secondary | ICD-10-CM

## 2020-05-18 DIAGNOSIS — K219 Gastro-esophageal reflux disease without esophagitis: Secondary | ICD-10-CM | POA: Insufficient documentation

## 2020-05-18 DIAGNOSIS — C50412 Malignant neoplasm of upper-outer quadrant of left female breast: Secondary | ICD-10-CM | POA: Diagnosis not present

## 2020-05-18 DIAGNOSIS — M4328 Fusion of spine, sacral and sacrococcygeal region: Secondary | ICD-10-CM | POA: Diagnosis not present

## 2020-05-18 HISTORY — PX: SACROILIAC JOINT FUSION: SHX6088

## 2020-05-18 LAB — SURGICAL PCR SCREEN
MRSA, PCR: NEGATIVE
Staphylococcus aureus: NEGATIVE

## 2020-05-18 SURGERY — SACROILIAC JOINT FUSION
Anesthesia: General | Laterality: Left

## 2020-05-18 MED ORDER — MIDAZOLAM HCL 2 MG/2ML IJ SOLN
INTRAMUSCULAR | Status: AC
  Filled 2020-05-18: qty 2

## 2020-05-18 MED ORDER — BUPIVACAINE-EPINEPHRINE (PF) 0.25% -1:200000 IJ SOLN
INTRAMUSCULAR | Status: DC | PRN
  Administered 2020-05-18: 20 mL

## 2020-05-18 MED ORDER — CEFAZOLIN SODIUM-DEXTROSE 2-4 GM/100ML-% IV SOLN
2.0000 g | INTRAVENOUS | Status: AC
  Administered 2020-05-18: 2 g via INTRAVENOUS

## 2020-05-18 MED ORDER — PROPOFOL 10 MG/ML IV BOLUS
INTRAVENOUS | Status: DC | PRN
  Administered 2020-05-18: 200 mg via INTRAVENOUS

## 2020-05-18 MED ORDER — EPHEDRINE SULFATE-NACL 50-0.9 MG/10ML-% IV SOSY
PREFILLED_SYRINGE | INTRAVENOUS | Status: DC | PRN
  Administered 2020-05-18 (×2): 5 mg via INTRAVENOUS

## 2020-05-18 MED ORDER — EPINEPHRINE PF 1 MG/ML IJ SOLN
INTRAMUSCULAR | Status: AC
  Filled 2020-05-18: qty 1

## 2020-05-18 MED ORDER — METHOCARBAMOL 500 MG PO TABS
500.0000 mg | ORAL_TABLET | Freq: Once | ORAL | Status: AC
  Administered 2020-05-18: 500 mg via ORAL

## 2020-05-18 MED ORDER — ACETAMINOPHEN 500 MG PO TABS
ORAL_TABLET | ORAL | Status: AC
  Administered 2020-05-18: 1000 mg via ORAL
  Filled 2020-05-18: qty 2

## 2020-05-18 MED ORDER — LIDOCAINE 2% (20 MG/ML) 5 ML SYRINGE
INTRAMUSCULAR | Status: DC | PRN
  Administered 2020-05-18: 40 mg via INTRAVENOUS

## 2020-05-18 MED ORDER — LACTATED RINGERS IV SOLN: INTRAVENOUS | Status: DC | PRN

## 2020-05-18 MED ORDER — METHOCARBAMOL 500 MG PO TABS: 500.0000 mg | ORAL_TABLET | Freq: Four times a day (QID) | ORAL | 0 refills | Status: DC | PRN

## 2020-05-18 MED ORDER — BUPIVACAINE LIPOSOME 1.3 % IJ SUSP
INTRAMUSCULAR | Status: DC | PRN
  Administered 2020-05-18: 10 mL

## 2020-05-18 MED ORDER — FENTANYL CITRATE (PF) 100 MCG/2ML IJ SOLN
INTRAMUSCULAR | Status: AC
  Filled 2020-05-18: qty 2

## 2020-05-18 MED ORDER — FENTANYL CITRATE (PF) 250 MCG/5ML IJ SOLN
INTRAMUSCULAR | Status: AC
  Filled 2020-05-18: qty 5

## 2020-05-18 MED ORDER — FENTANYL CITRATE (PF) 100 MCG/2ML IJ SOLN
INTRAMUSCULAR | Status: DC | PRN
  Administered 2020-05-18: 100 ug via INTRAVENOUS

## 2020-05-18 MED ORDER — CHLORHEXIDINE GLUCONATE 0.12 % MT SOLN
OROMUCOSAL | Status: AC
  Administered 2020-05-18: 15 mL
  Filled 2020-05-18: qty 15

## 2020-05-18 MED ORDER — BUPIVACAINE LIPOSOME 1.3 % IJ SUSP
20.0000 mL | Freq: Once | INTRAMUSCULAR | Status: DC
  Filled 2020-05-18: qty 20

## 2020-05-18 MED ORDER — ONDANSETRON HCL 4 MG/2ML IJ SOLN
INTRAMUSCULAR | Status: DC | PRN
  Administered 2020-05-18: 4 mg via INTRAVENOUS

## 2020-05-18 MED ORDER — OXYCODONE-ACETAMINOPHEN 5-325 MG PO TABS
ORAL_TABLET | ORAL | Status: AC
  Filled 2020-05-18: qty 1

## 2020-05-18 MED ORDER — EPHEDRINE 5 MG/ML INJ
INTRAVENOUS | Status: AC
  Filled 2020-05-18: qty 10

## 2020-05-18 MED ORDER — PHENYLEPHRINE HCL-NACL 10-0.9 MG/250ML-% IV SOLN
INTRAVENOUS | Status: DC | PRN
Start: 2020-05-18 — End: 2020-05-18
  Administered 2020-05-18: 80 ug/min via INTRAVENOUS

## 2020-05-18 MED ORDER — ROCURONIUM BROMIDE 100 MG/10ML IV SOLN
INTRAVENOUS | Status: DC | PRN
  Administered 2020-05-18: 50 mg via INTRAVENOUS
  Administered 2020-05-18: 20 mg via INTRAVENOUS

## 2020-05-18 MED ORDER — ONDANSETRON HCL 4 MG/2ML IJ SOLN: 4.0000 mg | Freq: Once | INTRAMUSCULAR | Status: DC | PRN

## 2020-05-18 MED ORDER — OXYCODONE-ACETAMINOPHEN 5-325 MG PO TABS: 1.0000 | ORAL_TABLET | ORAL | 0 refills | Status: AC | PRN

## 2020-05-18 MED ORDER — ACETAMINOPHEN 500 MG PO TABS: 1000.0000 mg | ORAL_TABLET | Freq: Once | ORAL | Status: AC

## 2020-05-18 MED ORDER — CEFAZOLIN SODIUM-DEXTROSE 2-4 GM/100ML-% IV SOLN
INTRAVENOUS | Status: AC
  Filled 2020-05-18: qty 100

## 2020-05-18 MED ORDER — LACTATED RINGERS IV SOLN: INTRAVENOUS | Status: DC

## 2020-05-18 MED ORDER — BUPIVACAINE HCL (PF) 0.25 % IJ SOLN
INTRAMUSCULAR | Status: AC
  Filled 2020-05-18: qty 30

## 2020-05-18 MED ORDER — SUGAMMADEX SODIUM 200 MG/2ML IV SOLN
INTRAVENOUS | Status: DC | PRN
Start: 2020-05-18 — End: 2020-05-18
  Administered 2020-05-18: 200 mg via INTRAVENOUS

## 2020-05-18 MED ORDER — METHOCARBAMOL 500 MG PO TABS
ORAL_TABLET | ORAL | Status: AC
  Filled 2020-05-18: qty 1

## 2020-05-18 MED ORDER — PROPOFOL 10 MG/ML IV BOLUS
INTRAVENOUS | Status: AC
  Filled 2020-05-18: qty 20

## 2020-05-18 MED ORDER — FENTANYL CITRATE (PF) 100 MCG/2ML IJ SOLN
25.0000 ug | INTRAMUSCULAR | Status: DC | PRN
  Administered 2020-05-18 (×4): 25 ug via INTRAVENOUS

## 2020-05-18 MED ORDER — PHENYLEPHRINE 40 MCG/ML (10ML) SYRINGE FOR IV PUSH (FOR BLOOD PRESSURE SUPPORT)
PREFILLED_SYRINGE | INTRAVENOUS | Status: DC | PRN
  Administered 2020-05-18: 80 ug via INTRAVENOUS
  Administered 2020-05-18: 120 ug via INTRAVENOUS

## 2020-05-18 MED ORDER — OXYCODONE-ACETAMINOPHEN 5-325 MG PO TABS
1.0000 | ORAL_TABLET | Freq: Once | ORAL | Status: AC
  Administered 2020-05-18: 1 via ORAL

## 2020-05-18 MED ORDER — POVIDONE-IODINE 7.5 % EX SOLN
Freq: Once | CUTANEOUS | Status: DC
  Filled 2020-05-18: qty 118

## 2020-05-18 SURGICAL SUPPLY — 67 items
BENZOIN TINCTURE PRP APPL 2/3 (GAUZE/BANDAGES/DRESSINGS) ×3 IMPLANT
BIT DRILL CANNULATED 7.0X3.2MM (BIT) ×1 IMPLANT
BLADE CLIPPER SURG (BLADE) ×3 IMPLANT
BLADE SURG 10 STRL SS (BLADE) ×3 IMPLANT
CANISTER SUCT 3000ML PPV (MISCELLANEOUS) ×3 IMPLANT
CLOSURE STERI-STRIP 1/2X4 (GAUZE/BANDAGES/DRESSINGS) ×1
CLOSURE WOUND 1/2 X4 (GAUZE/BANDAGES/DRESSINGS) ×1
CLSR STERI-STRIP ANTIMIC 1/2X4 (GAUZE/BANDAGES/DRESSINGS) ×2 IMPLANT
COVER SURGICAL LIGHT HANDLE (MISCELLANEOUS) ×6 IMPLANT
COVER WAND RF STERILE (DRAPES) ×3 IMPLANT
DRAPE C-ARM 42X72 X-RAY (DRAPES) ×3 IMPLANT
DRAPE C-ARMOR (DRAPES) ×3 IMPLANT
DRAPE INCISE IOBAN 66X45 STRL (DRAPES) ×3 IMPLANT
DRAPE POUCH INSTRU U-SHP 10X18 (DRAPES) ×3 IMPLANT
DRAPE SURG 17X23 STRL (DRAPES) ×9 IMPLANT
DRILL CANNULATED 7.0X3.2MM (BIT) ×3
DURAPREP 26ML APPLICATOR (WOUND CARE) ×3 IMPLANT
ELECT CAUTERY BLADE 6.4 (BLADE) ×3 IMPLANT
ELECT REM PT RETURN 9FT ADLT (ELECTROSURGICAL) ×3
ELECTRODE REM PT RTRN 9FT ADLT (ELECTROSURGICAL) ×1 IMPLANT
GAUZE 4X4 16PLY RFD (DISPOSABLE) ×3 IMPLANT
GAUZE SPONGE 4X4 12PLY STRL (GAUZE/BANDAGES/DRESSINGS) ×3 IMPLANT
GLOVE BIO SURGEON STRL SZ7 (GLOVE) ×3 IMPLANT
GLOVE BIO SURGEON STRL SZ8 (GLOVE) ×3 IMPLANT
GLOVE BIOGEL PI IND STRL 7.0 (GLOVE) ×1 IMPLANT
GLOVE BIOGEL PI IND STRL 8 (GLOVE) ×1 IMPLANT
GLOVE BIOGEL PI INDICATOR 7.0 (GLOVE) ×2
GLOVE BIOGEL PI INDICATOR 8 (GLOVE) ×2
GOWN STRL REUS W/ TWL LRG LVL3 (GOWN DISPOSABLE) ×2 IMPLANT
GOWN STRL REUS W/ TWL XL LVL3 (GOWN DISPOSABLE) ×1 IMPLANT
GOWN STRL REUS W/TWL LRG LVL3 (GOWN DISPOSABLE) ×6
GOWN STRL REUS W/TWL XL LVL3 (GOWN DISPOSABLE) ×3
IMPL IFUSE 7.0MMX45MM (Rod) ×1 IMPLANT
IMPLANT IFUSE 7.0MMX40MM (Rod) ×6 IMPLANT
IMPLANT IFUSE 7.0MMX45MM (Rod) ×3 IMPLANT
KIT BASIN OR (CUSTOM PROCEDURE TRAY) ×3 IMPLANT
KIT TURNOVER KIT B (KITS) ×3 IMPLANT
MANIFOLD NEPTUNE II (INSTRUMENTS) ×3 IMPLANT
NEEDLE 22X1 1/2 (OR ONLY) (NEEDLE) ×3 IMPLANT
NEEDLE HYPO 25GX1X1/2 BEV (NEEDLE) ×3 IMPLANT
NS IRRIG 1000ML POUR BTL (IV SOLUTION) ×3 IMPLANT
PACK UNIVERSAL I (CUSTOM PROCEDURE TRAY) ×3 IMPLANT
PAD ARMBOARD 7.5X6 YLW CONV (MISCELLANEOUS) ×6 IMPLANT
PENCIL BUTTON HOLSTER BLD 10FT (ELECTRODE) ×3 IMPLANT
PIN PUSH 3.2MM (PIN) ×3 IMPLANT
PIN STEINMAN (PIN) ×3 IMPLANT
PIN STEINMAN 3.2MM (PIN) ×9 IMPLANT
SPONGE LAP 18X18 RF (DISPOSABLE) ×3 IMPLANT
STAPLER VISISTAT 35W (STAPLE) ×3 IMPLANT
STRIP CLOSURE SKIN 1/2X4 (GAUZE/BANDAGES/DRESSINGS) ×2 IMPLANT
SUT MNCRL AB 4-0 PS2 18 (SUTURE) ×3 IMPLANT
SUT VIC AB 0 CT1 18XCR BRD 8 (SUTURE) IMPLANT
SUT VIC AB 0 CT1 8-18 (SUTURE)
SUT VIC AB 1 CT1 18XCR BRD 8 (SUTURE) ×1 IMPLANT
SUT VIC AB 1 CT1 8-18 (SUTURE) ×3
SUT VIC AB 2-0 CT2 18 VCP726D (SUTURE) ×3 IMPLANT
SYR BULB IRRIG 60ML STRL (SYRINGE) ×6 IMPLANT
SYR CONTROL 10ML LL (SYRINGE) ×3 IMPLANT
SYS SPNL FX3ANG 40X7XFSN (Rod) ×2 IMPLANT
SYSTEM SPNL FX3ANG 40X7XFSN (Rod) ×2 IMPLANT
TAPE CLOTH SURG 4X10 WHT LF (GAUZE/BANDAGES/DRESSINGS) ×3 IMPLANT
TOWEL GREEN STERILE (TOWEL DISPOSABLE) ×6 IMPLANT
TOWEL GREEN STERILE FF (TOWEL DISPOSABLE) ×3 IMPLANT
TUBE CONNECTING 12'X1/4 (SUCTIONS) ×1
TUBE CONNECTING 12X1/4 (SUCTIONS) ×2 IMPLANT
WATER STERILE IRR 1000ML POUR (IV SOLUTION) ×3 IMPLANT
YANKAUER SUCT BULB TIP NO VENT (SUCTIONS) ×3 IMPLANT

## 2020-05-18 NOTE — H&P (Signed)
PREOPERATIVE H&P  Chief Complaint: Left low back pain  HPI: Alexis Fox is a 64 y.o. female who presents with ongoing pain in the low back  The patient's history and her response to previous treatments is diagnostic for left sacroiliac joint dysfunction.  She has failed multiple forms of conservative care and continues to have pain (see office notes for additional details regarding the patient's full course of treatment).  Past Medical History:  Diagnosis Date  . Anxiety   . Arthritis    knees  . Breast cancer ([place])   . Breast cancer of upper-outer quadrant of left female breast ([place]) 03/20/2016  . C2 cervical fracture ([place])    10/22/18, following fall from ladder  . Cervicalgia   . Cholecystitis   . Complication of anesthesia    BP "crashes" post op  . Depression   . Early cataracts, bilateral   . Family history of brain cancer   . Family history of breast cancer   . GERD (gastroesophageal reflux disease)   . Headache    " from my neck"  . Hot flashes   . Personal history of chemotherapy   . Personal history of radiation therapy   . Restless leg syndrome   . Sleep apnea 10/03/2018   wears CPAP   Past Surgical History:  Procedure Laterality Date  . ANTERIOR CERVICAL DECOMP/DISCECTOMY FUSION N/A 01/22/2020   Procedure: ACDF - C2-C3;  Surgeon: Eustace Moore, MD;  Location: [place];  Service: Neurosurgery;  Laterality: N/A;  . BACK SURGERY     x2  . BREAST BIOPSY    . BREAST LUMPECTOMY Left 2018  . BUNIONECTOMY    . CHOLECYSTECTOMY N/A 10/04/2015   Procedure: LAPAROSCOPIC CHOLECYSTECTOMY WITH INTRAOPERATIVE CHOLANGIOGRAM;  Surgeon: Greer Pickerel, MD;  Location: [place];  Service: General;  Laterality: N/A;  . ELBOW SURGERY     right for epicondylitis 2010   . FOOT SURGERY     1983 -tarsal tunnel release  . IR GENERIC HISTORICAL  10/26/2016   IR CV LINE INJECTION 10/26/2016 [facility]-INTERV RAD  . LUMBAR DISC SURGERY  04/04/2012  . PORT-A-CATH REMOVAL N/A  11/15/2016   Procedure: REMOVAL PORT-A-CATH;  Surgeon: Rolm Bookbinder, MD;  Location: [place];  Service: General;  Laterality: N/A;  . PORTACATH PLACEMENT Right 04/02/2016   Procedure: INSERTION PORT-A-CATH WITH [place];  Surgeon: Rolm Bookbinder, MD;  Location: [place];  Service: General;  Laterality: Right;  . RADIOACTIVE SEED GUIDED PARTIAL MASTECTOMY/AXILLARY SENTINEL NODE BIOPSY/AXILLARY NODE DISSECTION Left 09/12/2016   Procedure: LEFT BREAST SEED GUIDED LUMPECTOMY, LEFT AXILLARY SENTINEL NODE BIOPSY, LEFT SEED GUIDED AXILLARY NODE EXCISION( TARGETED AXILLARY DISSECTION), BLUE DYE INJECTION;  Surgeon: Rolm Bookbinder, MD;  Location: [place];  Service: General;  Laterality: Left;  . SPINAL FUSION  5/12   L5-S1  . SPINAL FUSION  05/18/2019   L4-5  . TARSAL TUNNEL RELEASE     Social History   Socioeconomic History  . Marital status: Married    Spouse name: Not on file  . Number of children: 2  . Years of education: Not on file  . Highest education level: Not on file  Occupational History  . Not on file  Tobacco Use  . Smoking status: Never Smoker  . Smokeless tobacco: Never Used  Vaping Use  . Vaping Use: Never used  Substance and Sexual Activity  . Alcohol use: Yes    Alcohol/week: 1.0 standard drink  Types: 1 Cans of beer per week    Comment: occasional  . Drug use: No  . Sexual activity: Not Currently    Birth control/protection: Post-menopausal  Other Topics Concern  . Not on file  Social History Narrative  . Not on file   Social Determinants of Health   Financial Resource Strain:   . Difficulty of Paying Living Expenses:   Food Insecurity:   . Worried About Charity fundraiser in the Last Year:   . Arboriculturist in the Last Year:   Transportation Needs:   . Film/video editor (Medical):   Marland Kitchen Lack of Transportation (Non-Medical):   Physical Activity:   . Days of Exercise per Week:   . Minutes of  Exercise per Session:   Stress:   . Feeling of Stress :   Social Connections:   . Frequency of Communication with Friends and Family:   . Frequency of Social Gatherings with Friends and Family:   . Attends Religious Services:   . Active Member of Clubs or Organizations:   . Attends Archivist Meetings:   Marland Kitchen Marital Status:    Family History  Problem Relation Age of Onset  . Heart disease Mother   . Pulmonary fibrosis Mother   . Hypertension Mother   . Cancer Father 55       astocytoma  . Hypertension Brother   . Lymphoma Maternal Aunt   . Anesthesia problems Neg Hx    Allergies  Allergen Reactions  . Turmeric Diarrhea    Severe diarrhea  . Decadron [Dexamethasone]     Exacerbates restless leg   Prior to Admission medications   Medication Sig Start Date End Date Taking? Authorizing Provider  Ascorbic Acid (VITA-C PO) Take 1 tablet by mouth daily at 12 noon.   Yes [provider]  buPROPion (WELLBUTRIN XL) 150 MG 24 hr tablet Take 150 mg by mouth daily. 03/06/16  Yes [provider]  cetirizine (ZYRTEC) 10 MG tablet Take 10 mg by mouth at bedtime.    Yes [provider]  Cholecalciferol (VITAMIN D3) 50 MCG (2000 UT) TABS Take 2,000 Units by mouth daily.    Yes [provider]  citalopram (CELEXA) 40 MG tablet Take 40 mg by mouth at bedtime.  12/21/15  Yes [provider]  clonazePAM (KLONOPIN) 1 MG tablet Take 1 mg by mouth at bedtime.    Yes [provider]  cycloSPORINE (RESTASIS) 0.05 % ophthalmic emulsion Place 1 drop into both eyes 2 (two) times daily.   Yes [provider]  docusate sodium (COLACE) 100 MG capsule Take 100 mg by mouth 2 (two) times daily.   Yes [provider]  Esomeprazole Magnesium 20 MG TBEC Take 20 mg by mouth daily before breakfast.    Yes [provider]  ibuprofen (ADVIL) 200 MG tablet Take 800 mg by mouth in the morning and at bedtime.   Yes [provider]  Iron-Vitamin C (IRON 100/C PO) Take 1 tablet by mouth in the morning and at bedtime.    Yes [provider]  nystatin-triamcinolone ointment (MYCOLOG) Apply 1 application topically 2 (two) times daily. Patient taking differently: Apply 1 application topically daily as needed (skin irritation).  07/27/19  Yes Huel Cote, NP  pramipexole (MIRAPEX) 0.5 MG tablet Take 1/2 tab at 4 pm and 1 tab at night time prn. Patient taking differently: Take 0.5 mg by mouth at bedtime.  06/18/19  Yes [place],  Megan, NP  zolpidem (AMBIEN) 5 MG tablet Take 5 mg by mouth at bedtime.  09/17/18  Yes [provider]  oxyCODONE (OXY IR/ROXICODONE) 5 MG immediate release tablet Take 1 tablet (5 mg total) by mouth every 4 (four) hours as needed for severe pain ((score 4 to 6)). Patient not taking: Reported on 04/26/2020 01/25/20   Eustace Moore, MD     All other systems have been reviewed and were otherwise negative with the exception of those mentioned in the HPI and as above.  Physical Exam: Vitals:   05/18/20 1031  BP: (!) 124/55  Pulse: 75  Resp: 18  Temp: 98 F (36.7 C)  SpO2: 100%    Body mass index is 35.02 kg/m.  General: Alert, no acute distress Cardiovascular: No pedal edema Respiratory: No cyanosis, no use of accessory musculature Skin: No lesions in the area of chief complaint Neurologic: Sensation intact distally Psychiatric: Patient is competent for consent with normal mood and affect Lymphatic: No axillary or cervical lymphadenopathy  Assessment/Plan: LEFT SACROILIAC JOINT DYSFUNCTION Plan for Procedure(s): LEFT SACROILIAC [place], MD 05/18/2020 11:35 AM

## 2020-05-18 NOTE — Transfer of Care (Signed)
Immediate Anesthesia Transfer of Care Note  Patient: Alexis Fox  Procedure(s) Performed: LEFT SACROILIAC JOINT FUSION (Left )  Patient Location: PACU  Anesthesia Type:General  Level of Consciousness: drowsy and patient cooperative  Airway & Oxygen Therapy: Patient Spontanous Breathing and Patient connected to nasal cannula oxygen  Post-op Assessment: Report given to RN and Post -op Vital signs reviewed and stable  Post vital signs: Reviewed and stable  Last Vitals:  Vitals Value Taken Time  BP 115/53 05/18/20 1444  Temp    Pulse 86 05/18/20 1446  Resp 18 05/18/20 1446  SpO2 98 % 05/18/20 1446  Vitals shown include unvalidated device data.  Last Pain:  Vitals:   05/18/20 1052  PainSc: 0-No pain         Complications: No complications documented.

## 2020-05-18 NOTE — Anesthesia Procedure Notes (Signed)
Procedure Name: Intubation Date/Time: 05/18/2020 1:02 PM Performed by: Eligha Bridegroom, CRNA Pre-anesthesia Checklist: Patient identified, Emergency Drugs available, Suction available, Patient being monitored and Timeout performed Patient Re-evaluated:Patient Re-evaluated prior to induction Oxygen Delivery Method: Circle system utilized Preoxygenation: Pre-oxygenation with 100% oxygen Induction Type: IV induction Ventilation: Mask ventilation without difficulty and Oral airway inserted - appropriate to patient size Grade View: Grade II Tube type: Oral Tube size: 7.0 mm Number of attempts: 1 Airway Equipment and Method: Stylet and Video-laryngoscopy Placement Confirmation: ETT inserted through vocal cords under direct vision,  positive ETCO2 and breath sounds checked- equal and bilateral Secured at: 21 cm Tube secured with: Tape Dental Injury: Teeth and Oropharynx as per pre-operative assessment

## 2020-05-18 NOTE — Anesthesia Postprocedure Evaluation (Signed)
Anesthesia Post Note  Patient: Alexis Fox  Procedure(s) Performed: LEFT SACROILIAC JOINT FUSION (Left )     Patient location during evaluation: PACU Anesthesia Type: General Level of consciousness: awake and alert Pain management: pain level controlled Vital Signs Assessment: post-procedure vital signs reviewed and stable Respiratory status: spontaneous breathing, nonlabored ventilation, respiratory function stable and patient connected to nasal cannula oxygen Cardiovascular status: blood pressure returned to baseline and stable Postop Assessment: no apparent nausea or vomiting Anesthetic complications: no   No complications documented.  Last Vitals:  Vitals:   05/18/20 1500 05/18/20 1515  BP: (!) 105/58 (!) 99/55  Pulse: 76 73  Resp: 15 19  Temp:    SpO2: 97% 100%    Last Pain:  Vitals:   05/18/20 1500  PainSc: 8                  Catalina Gravel

## 2020-05-18 NOTE — Op Note (Signed)
NAME:  [name]         MEDICAL RECORD NO.:  [id number]   DATE OF BIRTH:  07/18/1956   DATE OF PROCEDURE:  05/18/2020                               OPERATIVE REPORT     PREOPERATIVE DIAGNOSES: 1.  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   ASSISTANTPricilla Holm, PA-C.   ANESTHESIA:  General endotracheal anesthesia.   COMPLICATIONS:  None.   DISPOSITION:  Stable.   ESTIMATED BLOOD LOSS:  Minimal.   INDICATIONS FOR SURGERY:  Briefly, [name] is a pleasant 64 year old female, who did present to me with ongoing severe pain at the left side of her low back.  Her work-up was diagnostic for bilateral sacroiliac joint dysfunction.  She did fail appropriate conservative treatment measures, and as such, we did discuss proceeding with the surgery noted above.  The patient was made fully aware of the risks and recovery period associated with surgery, and she did wish to proceed.  The plan preoperatively was to stage her surgeries, and to return for a right sided sacroiliac joint fusion approximately 6 to 8 weeks following her surgery today.   OPERATIVE DETAILS:  On 05/18/2020, the patient was brought to surgery and general endotracheal anesthesia was administered.  The patient was placed prone on a flat Jackson bed, withdrawal was placed beneath the patient's chest and hips. The region of the left low back was prepped and draped in the usual sterile fashion.  A timeout procedure was performed.  Fluoroscopy was brought into the field.  I was able to ensure adequate lateral, inlet, and outlet radiographs.  Clearly, the patient was noted to have transitional anatomy.  At this point, a 3 cm incision was made in line with the posterior border of the sacrum on the left.  3 guidewires were advanced across  the sacroiliac joint on the left side.  Given the patient's transitional anatomy, the first guidewire was aligned with the S1 foramen, the second was between the S1 and S2 foramen, and the third was in line with the S2 foramen.  Fluoroscopy was liberally used while advancing the guidewires in order to ensure a safe trajectory of the guidewires..  I then drilled and broached over the guidewires.  At this point, 7 mm implants were advanced across the sacroiliac joints from superiorly to inferiorly. The length of the implants were 40 mm, 45 mm, and 40 mm.  I was very pleased with the resting position of the implants on lateral, inlet, and outlet fluoroscopy.  The guidewires were then removed.  I was very pleased with the final lateral, inlet, and outlet radiographs.  At this point, the wound was copiously irrigated and closed in layers, using #1 Vicryl, followed by 2-0 Vicryl, followed by 4-0 Monocryl.  All instrument counts were correct at the termination of the procedure.   Of note, Pricilla Holm was my assistant throughout surgery, and did aid in retraction, suctioning, placement of the hardware, and closure from start to finish.     Phylliss Bob, MD

## 2020-05-19 ENCOUNTER — Encounter (HOSPITAL_COMMUNITY): Payer: Self-pay | Admitting: Orthopedic Surgery

## 2020-06-28 DIAGNOSIS — S12121A Other nondisplaced dens fracture, initial encounter for closed fracture: Secondary | ICD-10-CM | POA: Diagnosis not present

## 2020-06-28 DIAGNOSIS — M542 Cervicalgia: Secondary | ICD-10-CM | POA: Diagnosis not present

## 2020-07-01 DIAGNOSIS — M533 Sacrococcygeal disorders, not elsewhere classified: Secondary | ICD-10-CM | POA: Diagnosis not present

## 2020-07-01 DIAGNOSIS — Z9889 Other specified postprocedural states: Secondary | ICD-10-CM | POA: Diagnosis not present

## 2020-07-01 DIAGNOSIS — R1313 Dysphagia, pharyngeal phase: Secondary | ICD-10-CM | POA: Diagnosis not present

## 2020-07-18 ENCOUNTER — Encounter: Payer: Medicare PPO | Admitting: Nurse Practitioner

## 2020-07-25 DIAGNOSIS — H16223 Keratoconjunctivitis sicca, not specified as Sjogren's, bilateral: Secondary | ICD-10-CM | POA: Diagnosis not present

## 2020-07-25 DIAGNOSIS — H2513 Age-related nuclear cataract, bilateral: Secondary | ICD-10-CM | POA: Diagnosis not present

## 2020-07-25 DIAGNOSIS — H40033 Anatomical narrow angle, bilateral: Secondary | ICD-10-CM | POA: Diagnosis not present

## 2020-07-25 DIAGNOSIS — H40013 Open angle with borderline findings, low risk, bilateral: Secondary | ICD-10-CM | POA: Diagnosis not present

## 2020-08-10 ENCOUNTER — Ambulatory Visit (INDEPENDENT_AMBULATORY_CARE_PROVIDER_SITE_OTHER): Payer: Medicare PPO | Admitting: Nurse Practitioner

## 2020-08-10 ENCOUNTER — Other Ambulatory Visit: Payer: Self-pay

## 2020-08-10 ENCOUNTER — Encounter: Payer: Self-pay | Admitting: Nurse Practitioner

## 2020-08-10 VITALS — BP 115/80 | Ht 64.0 in | Wt 205.2 lb

## 2020-08-10 DIAGNOSIS — L900 Lichen sclerosus et atrophicus: Secondary | ICD-10-CM

## 2020-08-10 DIAGNOSIS — Z780 Asymptomatic menopausal state: Secondary | ICD-10-CM | POA: Diagnosis not present

## 2020-08-10 DIAGNOSIS — Z9289 Personal history of other medical treatment: Secondary | ICD-10-CM | POA: Diagnosis not present

## 2020-08-10 DIAGNOSIS — Z8619 Personal history of other infectious and parasitic diseases: Secondary | ICD-10-CM

## 2020-08-10 DIAGNOSIS — Z853 Personal history of malignant neoplasm of breast: Secondary | ICD-10-CM

## 2020-08-10 DIAGNOSIS — Z01419 Encounter for gynecological examination (general) (routine) without abnormal findings: Secondary | ICD-10-CM

## 2020-08-10 MED ORDER — CLOBETASOL PROPIONATE 0.05 % EX OINT: 1.0000 "application " | TOPICAL_OINTMENT | Freq: Two times a day (BID) | CUTANEOUS | 0 refills | Status: DC

## 2020-08-10 MED ORDER — CLOBETASOL PROPIONATE 0.05 % EX OINT: 1.0000 "application " | TOPICAL_OINTMENT | Freq: Two times a day (BID) | CUTANEOUS | 1 refills | Status: DC

## 2020-08-10 NOTE — Addendum Note (Signed)
Addended by: Lorine Bears on: 08/10/2020 12:27 PM   Modules accepted: Orders

## 2020-08-10 NOTE — Progress Notes (Signed)
   Alexis Fox [facility] 06-25-1956 [id number]   History:  64 y.o. G3P2 presents for breast and pelvic exam. Postmenopausal - no HRT, no bleeding. 2017 left breast cancer triple negative treated with lumpectomy, radiation, and chemotherapy. Normal pap history. History of HSV-2, rare outbreaks. Complains of vaginal irritation and itching that comes and goes. Has been using Nystatin cream that she had with no relief.   Gynecologic History No LMP recorded. Patient is postmenopausal.   Last Pap:02/04/2018. Results were: normal Last mammogram: 04/25/2020. Results were: normal Last colonoscopy: . Results were: normal, 5 year follow up recommended Last Dexa: 01/10/2015. Results were: normal  Past medical history, past surgical history, family history and social history were all reviewed and documented in the EPIC chart.  ROS:  A ROS was performed and pertinent positives and negatives are included.  Exam:  Vitals:   08/10/20 1135  BP: 115/80  Weight: 205 lb 3.2 oz (93.1 kg)  Height: 5\' 4"  (1.626 m)   Body mass index is 35.22 kg/m.  General appearance:  Normal Thyroid:  Symmetrical, normal in size, without palpable masses or nodularity. Respiratory  Auscultation:  Clear without wheezing or rhonchi Cardiovascular  Auscultation:  Regular rate, without rubs, murmurs or gallops  Edema/varicosities:  Not grossly evident Abdominal  Soft,nontender, without masses, guarding or rebound.  Liver/spleen:  No organomegaly noted  Hernia:  None appreciated  Skin  Inspection:  Grossly normal   Breasts: Examined lying and sitting.   Right: Without masses, retractions, discharge or axillary adenopathy.   Left: Without masses, retractions, discharge or axillary adenopathy. Well-healed lumpectomy scar in upper outer region  Gentitourinary   Inguinal/mons:  Normal without inguinal adenopathy  External genitalia:  Thin, pearly appearance consistent with lichen sclerosis. Some minor excoriation present    BUS/Urethra/Skene's glands:  Normal  Vagina:  Atrophy  Cervix:  Normal  Uterus:  Difficult to palpate due to body habitus but no gross masses or tenderness  Adnexa/parametria:     Rt: Without masses or tenderness.   Lt: Without masses or tenderness.  Anus and perineum: Normal  Digital rectal exam: Normal sphincter tone without palpated masses or tenderness  Assessment/Plan:  64 y.o. G3P2 for breast and pelvic exam.    Well female exam with routine gynecological exam - Education provided on SBEs, importance of preventative screenings, current guidelines, high calcium diet, regular exercise, and multivitamin daily. Labs done with PCP.   Screening for cervical cancer - normal pap history. Discussed the option to stop screenings per guidelines. She would like to continue every 2-3 years because of her cancer history. Pap today per request.   History of left breast cancer - 2017 triple negative. Treated with lumpectomy, radiation, and chemotherapy. Most recent mammogram 04/2020 normal. Normal breast exam today.   Postmenopausal - Plan: DG Bone Density. Dexa 0350 was normal.   Lichen sclerosus - Plan: clobetasol ointment (TEMOVATE) 0.05 % apply twice a day for 14 days and then as needed.   Follow up in 1 year for annual      [place], 11:54 AM 08/10/2020

## 2020-08-10 NOTE — Patient Instructions (Signed)

## 2020-08-11 LAB — PAP IG W/ RFLX HPV ASCU

## 2020-08-25 DIAGNOSIS — R1013 Epigastric pain: Secondary | ICD-10-CM | POA: Diagnosis not present

## 2020-08-25 DIAGNOSIS — R1314 Dysphagia, pharyngoesophageal phase: Secondary | ICD-10-CM | POA: Diagnosis not present

## 2020-08-25 DIAGNOSIS — R140 Abdominal distension (gaseous): Secondary | ICD-10-CM | POA: Diagnosis not present

## 2020-08-25 DIAGNOSIS — R1012 Left upper quadrant pain: Secondary | ICD-10-CM | POA: Diagnosis not present

## 2020-08-25 DIAGNOSIS — R1011 Right upper quadrant pain: Secondary | ICD-10-CM | POA: Diagnosis not present

## 2020-08-29 DIAGNOSIS — G4733 Obstructive sleep apnea (adult) (pediatric): Secondary | ICD-10-CM | POA: Diagnosis not present

## 2020-09-09 DIAGNOSIS — Z1159 Encounter for screening for other viral diseases: Secondary | ICD-10-CM | POA: Diagnosis not present

## 2020-09-14 DIAGNOSIS — K449 Diaphragmatic hernia without obstruction or gangrene: Secondary | ICD-10-CM | POA: Diagnosis not present

## 2020-09-14 DIAGNOSIS — K2289 Other specified disease of esophagus: Secondary | ICD-10-CM | POA: Diagnosis not present

## 2020-09-14 DIAGNOSIS — R140 Abdominal distension (gaseous): Secondary | ICD-10-CM | POA: Diagnosis not present

## 2020-09-14 DIAGNOSIS — R1012 Left upper quadrant pain: Secondary | ICD-10-CM | POA: Diagnosis not present

## 2020-09-14 DIAGNOSIS — R1310 Dysphagia, unspecified: Secondary | ICD-10-CM | POA: Diagnosis not present

## 2020-09-14 DIAGNOSIS — K2930 Chronic superficial gastritis without bleeding: Secondary | ICD-10-CM | POA: Diagnosis not present

## 2020-09-14 DIAGNOSIS — K3189 Other diseases of stomach and duodenum: Secondary | ICD-10-CM | POA: Diagnosis not present

## 2020-09-14 DIAGNOSIS — R1013 Epigastric pain: Secondary | ICD-10-CM | POA: Diagnosis not present

## 2020-09-14 DIAGNOSIS — R1011 Right upper quadrant pain: Secondary | ICD-10-CM | POA: Diagnosis not present

## 2020-09-14 DIAGNOSIS — K317 Polyp of stomach and duodenum: Secondary | ICD-10-CM | POA: Diagnosis not present

## 2020-09-16 DIAGNOSIS — Z1389 Encounter for screening for other disorder: Secondary | ICD-10-CM | POA: Diagnosis not present

## 2020-09-16 DIAGNOSIS — Z23 Encounter for immunization: Secondary | ICD-10-CM | POA: Diagnosis not present

## 2020-09-16 DIAGNOSIS — R1314 Dysphagia, pharyngoesophageal phase: Secondary | ICD-10-CM | POA: Diagnosis not present

## 2020-09-16 DIAGNOSIS — K219 Gastro-esophageal reflux disease without esophagitis: Secondary | ICD-10-CM | POA: Diagnosis not present

## 2020-09-16 DIAGNOSIS — R140 Abdominal distension (gaseous): Secondary | ICD-10-CM | POA: Diagnosis not present

## 2020-09-16 DIAGNOSIS — F325 Major depressive disorder, single episode, in full remission: Secondary | ICD-10-CM | POA: Diagnosis not present

## 2020-09-16 DIAGNOSIS — Z0000 Encounter for general adult medical examination without abnormal findings: Secondary | ICD-10-CM | POA: Diagnosis not present

## 2020-09-16 DIAGNOSIS — E669 Obesity, unspecified: Secondary | ICD-10-CM | POA: Diagnosis not present

## 2020-09-16 DIAGNOSIS — Z136 Encounter for screening for cardiovascular disorders: Secondary | ICD-10-CM | POA: Diagnosis not present

## 2020-09-16 DIAGNOSIS — R1013 Epigastric pain: Secondary | ICD-10-CM | POA: Diagnosis not present

## 2020-09-16 DIAGNOSIS — G2581 Restless legs syndrome: Secondary | ICD-10-CM | POA: Diagnosis not present

## 2020-09-16 DIAGNOSIS — G4733 Obstructive sleep apnea (adult) (pediatric): Secondary | ICD-10-CM | POA: Diagnosis not present

## 2020-09-20 DIAGNOSIS — K317 Polyp of stomach and duodenum: Secondary | ICD-10-CM | POA: Diagnosis not present

## 2020-09-20 DIAGNOSIS — K2930 Chronic superficial gastritis without bleeding: Secondary | ICD-10-CM | POA: Diagnosis not present

## 2020-09-20 DIAGNOSIS — K2289 Other specified disease of esophagus: Secondary | ICD-10-CM | POA: Diagnosis not present

## 2020-10-04 ENCOUNTER — Other Ambulatory Visit: Payer: Self-pay | Admitting: Sports Medicine

## 2020-10-04 DIAGNOSIS — D3610 Benign neoplasm of peripheral nerves and autonomic nervous system, unspecified: Secondary | ICD-10-CM

## 2020-10-04 NOTE — Progress Notes (Signed)
Rx sent to GI for MRI for left foot pain chronic

## 2020-10-05 ENCOUNTER — Telehealth: Payer: Self-pay | Admitting: *Deleted

## 2020-10-05 NOTE — Telephone Encounter (Signed)
Called and left a message for the patient and stated that she has an appointment on 10/13/2020 at [facility] [place]

## 2020-10-05 NOTE — Telephone Encounter (Signed)
-----   Message from Landis Martins, [place] sent at 10/04/2020  8:11 AM EDT ----- Juluis Rainier  Rx sent to GI for MRI for left foot pain chronic ----- Message ----- From: Viviana Simpler, PMAC Sent: 10/04/2020   8:06 AM EDT To: Landis Martins, DPM  Hey patient would like to see if we can get an MRI on my foot. Please advise. Lattie Haw

## 2020-10-12 ENCOUNTER — Other Ambulatory Visit: Payer: Self-pay

## 2020-10-12 ENCOUNTER — Ambulatory Visit (INDEPENDENT_AMBULATORY_CARE_PROVIDER_SITE_OTHER): Payer: Medicare PPO

## 2020-10-12 ENCOUNTER — Other Ambulatory Visit: Payer: Self-pay | Admitting: Nurse Practitioner

## 2020-10-12 DIAGNOSIS — Z780 Asymptomatic menopausal state: Secondary | ICD-10-CM

## 2020-10-12 DIAGNOSIS — M8589 Other specified disorders of bone density and structure, multiple sites: Secondary | ICD-10-CM

## 2020-10-13 ENCOUNTER — Other Ambulatory Visit: Payer: Medicare PPO

## 2020-11-01 ENCOUNTER — Other Ambulatory Visit: Payer: Self-pay | Admitting: Sports Medicine

## 2020-11-03 ENCOUNTER — Ambulatory Visit
Admission: RE | Admit: 2020-11-03 | Discharge: 2020-11-03 | Disposition: A | Discharge status: Home / Self Care | Payer: Medicare PPO | Source: Ambulatory Visit | Attending: Sports Medicine | Admitting: Sports Medicine

## 2020-11-03 ENCOUNTER — Other Ambulatory Visit: Payer: Self-pay

## 2020-11-03 DIAGNOSIS — G8929 Other chronic pain: Secondary | ICD-10-CM | POA: Diagnosis not present

## 2020-11-03 DIAGNOSIS — D3610 Benign neoplasm of peripheral nerves and autonomic nervous system, unspecified: Secondary | ICD-10-CM

## 2020-11-03 DIAGNOSIS — M19072 Primary osteoarthritis, left ankle and foot: Secondary | ICD-10-CM | POA: Diagnosis not present

## 2020-11-07 ENCOUNTER — Other Ambulatory Visit (HOSPITAL_COMMUNITY): Payer: Self-pay | Admitting: Gastroenterology

## 2020-11-07 ENCOUNTER — Other Ambulatory Visit: Payer: Self-pay | Admitting: Gastroenterology

## 2020-11-07 DIAGNOSIS — K3184 Gastroparesis: Secondary | ICD-10-CM

## 2020-11-09 ENCOUNTER — Encounter: Payer: Self-pay | Admitting: Sports Medicine

## 2020-11-09 ENCOUNTER — Other Ambulatory Visit: Payer: Self-pay

## 2020-11-09 ENCOUNTER — Ambulatory Visit: Payer: Medicare PPO | Admitting: Sports Medicine

## 2020-11-09 DIAGNOSIS — M7752 Other enthesopathy of left foot: Secondary | ICD-10-CM | POA: Diagnosis not present

## 2020-11-09 DIAGNOSIS — M79672 Pain in left foot: Secondary | ICD-10-CM | POA: Diagnosis not present

## 2020-11-09 DIAGNOSIS — M779 Enthesopathy, unspecified: Secondary | ICD-10-CM | POA: Diagnosis not present

## 2020-11-09 DIAGNOSIS — D3610 Benign neoplasm of peripheral nerves and autonomic nervous system, unspecified: Secondary | ICD-10-CM

## 2020-11-09 NOTE — Progress Notes (Signed)
Subjective: Alexis Fox is a 64 y.o. female patient who returns to office for follow-up evaluation of left foot at the ball especially at 2-3 toes with sharp pains that is worsening has not improved from prior even with injection.  Patient is also here for discussion of MRI results.  Patient Active Problem List   Diagnosis Date Noted  . S/P cervical spinal fusion 01/22/2020  . Memory deficit 01/14/2020  . Unilateral primary osteoarthritis, right hip 09/01/2019  . Trochanteric bursitis, left hip 07/14/2019  . S/P lumbar fusion 05/18/2019  . OSA (obstructive sleep apnea) 10/28/2018  . Pain managed using patient-controlled analgesia (PCA) 10/25/2018  . Fall 10/24/2018  . DDD (degenerative disc disease), lumbosacral 09/15/2018  . Chemotherapy-induced neuropathy ([place]) 09/15/2018  . Central line complication 49/67/5916  . Genetic testing 05/15/2016  . Family history of breast cancer   . Family history of brain cancer   . Breast cancer of upper-outer quadrant of left female breast ([place]) 03/20/2016  . Transaminitis 10/03/2015  . Symptomatic cholelithiasis 10/03/2015  . RUQ abdominal pain   . Depression 06/24/2013  . RLS (restless legs syndrome) 06/24/2013    Current Outpatient Medications on File Prior to Visit  Medication Sig Dispense Refill  . Ascorbic Acid (VITA-C PO) Take 1 tablet by mouth daily at 12 noon.    Marland Kitchen buPROPion (WELLBUTRIN XL) 150 MG 24 hr tablet Take 150 mg by mouth daily.  4  . cetirizine (ZYRTEC) 10 MG tablet Take 10 mg by mouth at bedtime.     . Cholecalciferol (VITAMIN D3) 50 MCG (2000 UT) TABS Take 2,000 Units by mouth daily.     . citalopram (CELEXA) 40 MG tablet Take 40 mg by mouth at bedtime.   3  . clobetasol ointment (TEMOVATE) 3.84 % Apply 1 application topically 2 (two) times daily. 30 g 1  . clonazePAM (KLONOPIN) 1 MG tablet Take 1 mg by mouth at bedtime.     . cycloSPORINE (RESTASIS) 0.05 % ophthalmic emulsion Place 1 drop into both eyes 2 (two)  times daily.    Marland Kitchen docusate sodium (COLACE) 100 MG capsule Take 100 mg by mouth 2 (two) times daily.    . Esomeprazole Magnesium 20 MG TBEC Take 20 mg by mouth daily before breakfast.  (Patient not taking: Reported on 08/10/2020)    . Iron-Vitamin C (IRON 100/C PO) Take 1 tablet by mouth in the morning and at bedtime.     . methocarbamol (ROBAXIN) 500 MG tablet Take 1 tablet (500 mg total) by mouth every 6 (six) hours as needed for muscle spasms. (Patient not taking: Reported on 08/10/2020) 60 tablet 0  . nystatin-triamcinolone ointment (MYCOLOG) Apply 1 application topically 2 (two) times daily. (Patient taking differently: Apply 1 application topically daily as needed (skin irritation). ) 60 g 1  . oxyCODONE (OXY IR/ROXICODONE) 5 MG immediate release tablet Take 1 tablet (5 mg total) by mouth every 4 (four) hours as needed for severe pain ((score 4 to 6)). (Patient not taking: Reported on 04/26/2020) 30 tablet 0  . oxyCODONE-acetaminophen (PERCOCET/ROXICET) 5-325 MG tablet Take by mouth.    . pramipexole (MIRAPEX) 0.5 MG tablet Take 1/2 tab at 4 pm and 1 tab at night time prn. (Patient taking differently: Take 0.5 mg by mouth at bedtime. ) 135 tablet 3  . zolpidem (AMBIEN) 5 MG tablet Take 5 mg by mouth at bedtime.   1   Current Facility-Administered Medications on File Prior to Visit  Medication Dose Route Frequency Provider Last  Rate Last Admin  . methylPREDNISolone acetate (DEPO-MEDROL) injection 40 mg  40 mg Intra-articular Once Hilts, Michael, MD      . sodium chloride flush (NS) 0.9 % injection 10 mL  10 mL Intracatheter PRN Nicholas Lose, MD   10 mL at 07/19/16 1434    Allergies  Allergen Reactions  . Turmeric Diarrhea    Severe diarrhea  . Decadron [Dexamethasone]     Exacerbates restless leg    Objective:  General: Alert and oriented x3 in no acute distress  Physical exam unchanged from prior.  Dermatology: Old surgical scars well healed on left, No open lesions bilateral lower  extremities, no webspace macerations, no ecchymosis bilateral, all nails x 10 are well manicured.  Vascular: Dorsalis Pedis and Posterior Tibial pedal pulses palpable, Capillary Fill Time 3 seconds,(+) pedal hair growth bilateral, no edema bilateral lower extremities, Temperature gradient within normal limits.  Neurology: Gross sensation intact via light touch bilateral,Numbness and tingling to Left 2-3 toes, negative moulders sign but the met heads are in close proximity like before.   Musculoskeletal: Worsening tenderness with palpation at Left 2nd interspace of the left foot and with palpation to ball of left foot. Strength within normal limits, no other acute findings.    Assessment and Plan: Problem List Items Addressed This Visit    None    Visit Diagnoses    Neuroma    -  Primary   Capsulitis       Left foot pain       Bursitis of intermetatarsal bursa of left foot           -Complete examination performed -Re-Discussed treatment options for neuroma with bursitis as with revealed on MRI -Patient opt for surgical management. Consent obtained for excision of inflamed tissue and nerve left foot second and third third and fourth toes.  Pre and Post op course explained. Risks, benefits, alternatives explained. No guarantees given or implied. Surgical booking slip submitted and provided patient with Surgical packet and info for [facility] longer cone day -H and P form given -Patient will be weightbearing to heel only -Patient to return to office after surgery or sooner if condition worsens.  Landis Martins, DPM

## 2020-11-10 ENCOUNTER — Telehealth: Payer: Self-pay

## 2020-11-10 ENCOUNTER — Telehealth: Payer: Self-pay | Admitting: Hematology and Oncology

## 2020-11-10 NOTE — Telephone Encounter (Signed)
R/s 2/10 appt per patient request. Confirmed new date and time

## 2020-11-10 NOTE — Telephone Encounter (Signed)
Received surgery paperwork from the [facility] office. Left a message for Darrelyn to call me back and we could get her surgery scheduled.

## 2020-11-14 DIAGNOSIS — H0102A Squamous blepharitis right eye, upper and lower eyelids: Secondary | ICD-10-CM | POA: Diagnosis not present

## 2020-11-14 DIAGNOSIS — H00015 Hordeolum externum left lower eyelid: Secondary | ICD-10-CM | POA: Diagnosis not present

## 2020-11-14 DIAGNOSIS — H0102B Squamous blepharitis left eye, upper and lower eyelids: Secondary | ICD-10-CM | POA: Diagnosis not present

## 2020-11-16 ENCOUNTER — Ambulatory Visit (HOSPITAL_COMMUNITY)
Admission: RE | Admit: 2020-11-16 | Discharge: 2020-11-16 | Disposition: A | Discharge status: Home / Self Care | Payer: Medicare PPO | Source: Ambulatory Visit | Attending: Gastroenterology | Admitting: Gastroenterology

## 2020-11-16 ENCOUNTER — Other Ambulatory Visit: Payer: Self-pay

## 2020-11-16 DIAGNOSIS — K3184 Gastroparesis: Secondary | ICD-10-CM | POA: Insufficient documentation

## 2020-11-16 MED ORDER — TECHNETIUM TC 99M SULFUR COLLOID
2.0000 | Freq: Once | INTRAVENOUS | Status: AC | PRN
  Administered 2020-11-16: 2 via INTRAVENOUS

## 2020-11-28 ENCOUNTER — Other Ambulatory Visit: Payer: Self-pay | Admitting: *Deleted

## 2020-11-28 ENCOUNTER — Other Ambulatory Visit (HOSPITAL_COMMUNITY)
Admission: RE | Admit: 2020-11-28 | Discharge: 2020-11-28 | Disposition: A | Discharge status: Home / Self Care | Payer: Medicare PPO | Source: Ambulatory Visit | Attending: Gastroenterology | Admitting: Gastroenterology

## 2020-11-28 DIAGNOSIS — Z01812 Encounter for preprocedural laboratory examination: Secondary | ICD-10-CM | POA: Insufficient documentation

## 2020-11-28 DIAGNOSIS — L900 Lichen sclerosus et atrophicus: Secondary | ICD-10-CM

## 2020-11-28 DIAGNOSIS — Z20822 Contact with and (suspected) exposure to covid-19: Secondary | ICD-10-CM | POA: Diagnosis not present

## 2020-11-28 LAB — SARS CORONAVIRUS 2 (TAT 6-24 HRS): SARS Coronavirus 2: NEGATIVE

## 2020-11-28 MED ORDER — CLOBETASOL PROPIONATE 0.05 % EX OINT: 1.0000 "application " | TOPICAL_OINTMENT | Freq: Two times a day (BID) | CUTANEOUS | 1 refills | Status: DC

## 2020-11-30 ENCOUNTER — Encounter (HOSPITAL_COMMUNITY)
Admission: RE | Disposition: A | Discharge status: Home / Self Care | Payer: Self-pay | Source: Home / Self Care | Attending: Gastroenterology

## 2020-11-30 ENCOUNTER — Ambulatory Visit (HOSPITAL_COMMUNITY)
Admission: RE | Admit: 2020-11-30 | Discharge: 2020-11-30 | Disposition: A | Discharge status: Home / Self Care | Payer: Medicare PPO | Attending: Gastroenterology | Admitting: Gastroenterology

## 2020-11-30 DIAGNOSIS — R1310 Dysphagia, unspecified: Secondary | ICD-10-CM | POA: Diagnosis not present

## 2020-11-30 HISTORY — PX: ESOPHAGEAL MANOMETRY: SHX5429

## 2020-11-30 SURGERY — MANOMETRY, ESOPHAGUS

## 2020-11-30 MED ORDER — LIDOCAINE HCL URETHRAL/MUCOSAL 2 % EX GEL
CUTANEOUS | Status: AC
  Filled 2020-11-30: qty 30

## 2020-11-30 SURGICAL SUPPLY — 2 items
FACESHIELD LNG OPTICON STERILE (SAFETY) IMPLANT
GLOVE BIO SURGEON STRL SZ8 (GLOVE) ×4 IMPLANT

## 2020-11-30 NOTE — Progress Notes (Signed)
Esophageal manometry performed per protocol.  Patient tolerated well.  Report to be sent to Dr. Vincent Schooler. 

## 2020-12-02 ENCOUNTER — Encounter (HOSPITAL_COMMUNITY): Payer: Self-pay | Admitting: Gastroenterology

## 2020-12-02 DIAGNOSIS — R1310 Dysphagia, unspecified: Secondary | ICD-10-CM | POA: Diagnosis not present

## 2021-01-12 ENCOUNTER — Ambulatory Visit: Payer: Medicare PPO | Admitting: Hematology and Oncology

## 2021-01-26 ENCOUNTER — Telehealth: Payer: Self-pay

## 2021-01-26 NOTE — Telephone Encounter (Signed)
DOS 02/06/2021  NEURECTOMY X 2 LT - 28080  [employer]  The following codes do not require a pre-authorization Created on 01/26/2021  Plan Year 12/04/2019 - 12/02/9998  Service info 28080 Excision, interdigital ([place]) neuroma, single, each 28080 Excision, interdigital ([place]) neuroma, single, each

## 2021-01-29 NOTE — Assessment & Plan Note (Signed)
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  PREVENT trial: [facility] [id number]: Patient is enrolled in this trial atorvastatin versus placebo No side effects related to the study drug.  Treatment summary:  1. Completed 4 cycles of dose dense Adriamycin and Cytoxan; and a 12 cycles ofAbraxanefrom 04/05/2016 to 08/16/2016 2. left lumpectomy 09/12/2016: IDC 0.5 cm, DCIS 1 cm, margins negative, 1/4 lymph nodes positive, T1aN1 stage II a pathologic stage 3. Adjuvant radiation 10/22/2016 to 12/11/2016 4. Adjuvant Xeloda 1500 mg twice daily 2 weeks on one week off started 01/07/2017, increasedthe dose to 2000g by mouth twice a day from2/26/2018 completed 05/27/2017 Patient refused participation [id number] ------------------------------------------------------------------------------------------------------------------------------------ Surveillance: 1. Breast Exam2/08/2020: Benign 2. Mammogram: 04/05/2020: Stable left lumpectomy site.  Benign Density cat D  Hospitalization November 2019: C1-C2 vertebral body fractures from a fall from the ladder  She has lots of plans to travel across [place]. She is waiting for her neck to get better.  Return to clinic in 1 year for follow-up

## 2021-01-29 NOTE — Progress Notes (Signed)
Patient Care Team: [name], MD as PCP - General (Internal Medicine) Delice Bison, Charlestine Massed, NP as Nurse Practitioner (Hematology and Oncology) Nicholas Lose, MD as Consulting Physician (Hematology and Oncology) Tyler Pita, MD as Consulting Physician (Radiation Oncology) Rolm Bookbinder, MD as Consulting Physician (General Surgery)  DIAGNOSIS:    ICD-10-CM   1. 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   09/12/2016 Surgery   Left breast lumpectomy ([place]): IDC, 0.5cm, DCIS 1 cm, fibrosis, margins negative, 1/3 lymph nodes positive for metastases, triple negative, KI67 90%.     10/22/2016 - 12/11/2016 Radiation Therapy   Adjuvant radiation therapy ([place]): 1. The Left breast was treated to 50.4 Gy in 28 fractions at 1.8 Gy per fraction.  2. The Left breast was boosted to 10 Gy in 5 fractions at 2 Gy per fraction.    01/14/2017 - 05/27/2017 Chemotherapy   Xeloda 2000 mg by  mouth twice a day 2 weeks on one week off for 6 months   10/24/2018 - 10/27/2018 Hospital Admission   C1-C2 vertebral fractures after a fall from the ladder: Right vertebral artery blunt vascular injury     CHIEF COMPLIANT: Surveillance oftriple negativeleft breast cancer  INTERVAL HISTORY: Alexis Fox is a 65 y.o. with above-mentioned history of triple negativeleftbreast cancer who is currently on surveillance.Mammogram on 04/25/20 showed no evidence of malignancy bilaterally. She presents to the clinic today for annual follow-up.   ALLERGIES:  is allergic to turmeric and decadron [dexamethasone].  MEDICATIONS:  Current Outpatient Medications  Medication Sig Dispense Refill  . Ascorbic Acid (VITA-C PO) Take 1 tablet by mouth daily at 12 noon.    Marland Kitchen buPROPion (WELLBUTRIN XL) 150 MG 24 hr tablet Take 150 mg by mouth daily.  4  . cetirizine (ZYRTEC) 10 MG tablet Take 10 mg by mouth at bedtime.     . Cholecalciferol (VITAMIN D3) 50 MCG (2000 UT) TABS Take 2,000 Units by mouth daily.     . citalopram (CELEXA) 40 MG tablet Take 40 mg by mouth at bedtime.   3  . clobetasol ointment (TEMOVATE) 0.92 % Apply 1 application topically 2 (two) times daily. 30 g 1  . clonazePAM (KLONOPIN) 1 MG tablet Take 1 mg by mouth at bedtime.     . cycloSPORINE (RESTASIS) 0.05 % ophthalmic emulsion Place 1 drop into both eyes 2 (two) times daily.    Marland Kitchen docusate sodium (COLACE) 100 MG capsule Take 100 mg by mouth 2 (two) times daily.    Marland Kitchen  Esomeprazole Magnesium 20 MG TBEC Take 20 mg by mouth daily before breakfast.  (Patient not taking: Reported on 08/10/2020)    . Iron-Vitamin C (IRON 100/C PO) Take 1 tablet by mouth in the morning and at bedtime.     . methocarbamol (ROBAXIN) 500 MG tablet Take 1 tablet (500 mg total) by mouth every 6 (six) hours as needed for muscle spasms. (Patient not taking: Reported on 08/10/2020) 60 tablet 0  . nystatin-triamcinolone ointment (MYCOLOG) Apply 1 application topically  2 (two) times daily. (Patient taking differently: Apply 1 application topically daily as needed (skin irritation). ) 60 g 1  . oxyCODONE (OXY IR/ROXICODONE) 5 MG immediate release tablet Take 1 tablet (5 mg total) by mouth every 4 (four) hours as needed for severe pain ((score 4 to 6)). (Patient not taking: Reported on 04/26/2020) 30 tablet 0  . oxyCODONE-acetaminophen (PERCOCET/ROXICET) 5-325 MG tablet Take by mouth.    . pramipexole (MIRAPEX) 0.5 MG tablet Take 1/2 tab at 4 pm and 1 tab at night time prn. (Patient taking differently: Take 0.5 mg by mouth at bedtime. ) 135 tablet 3  . zolpidem (AMBIEN) 5 MG tablet Take 5 mg by mouth at bedtime.   1   Current Facility-Administered Medications  Medication Dose Route Frequency Provider Last Rate Last Admin  . methylPREDNISolone acetate (DEPO-MEDROL) injection 40 mg  40 mg Intra-articular Once Hilts, Michael, MD       Facility-Administered Medications Ordered in Other Visits  Medication Dose Route Frequency Provider Last Rate Last Admin  . sodium chloride flush (NS) 0.9 % injection 10 mL  10 mL Intracatheter PRN Nicholas Lose, MD   10 mL at 07/19/16 1434    PHYSICAL EXAMINATION: ECOG PERFORMANCE STATUS: 1 - Symptomatic but completely ambulatory  Vitals:   01/30/21 0951  BP: 110/68  Pulse: 78  Resp: 18  Temp: (!) 97.5 F (36.4 C)  SpO2: 98%   Filed Weights   01/30/21 0951  Weight: 217 lb 9.6 oz (98.7 kg)    BREAST: No palpable masses or nodules in either right or left breasts. No palpable axillary supraclavicular or infraclavicular adenopathy no breast tenderness or nipple discharge. (exam performed in the presence of a chaperone)  LABORATORY DATA:  I have reviewed the data as listed CMP Latest Ref Rng & Units 05/10/2020 01/20/2020 05/15/2019  Glucose 70 - 99 mg/dL 91 92 94  BUN 8 - 23 mg/dL _0 Creatinine 0.44 - 1.00 mg/dL 0.83 0.97 0.71  Sodium 135 - 145 mmol/L 146(H) 139 141  Potassium 3.5 - 5.1 mmol/L 4.0 4.5 4.2  Chloride  98 - 111 mmol/L 108 102 109  CO2 22 - 32 mmol/L _1 Calcium 8.9 - 10.3 mg/dL 9.5 10.0 9.5  Total Protein 6.5 - 8.1 g/dL 6.7 - -  Total Bilirubin 0.3 - 1.2 mg/dL 0.5 - -  Alkaline Phos 38 - 126 U/L 68 - -  AST 15 - 41 U/L 22 - -  ALT 0 - 44 U/L 20 - -    Lab Results  Component Value Date   WBC 6.2 05/10/2020   HGB 12.6 05/10/2020   HCT 40.2 05/10/2020   MCV 95.9 05/10/2020   PLT 228 05/10/2020   NEUTROABS 3.2 05/10/2020    ASSESSMENT & PLAN:  Breast cancer of upper-outer quadrant of left female breast ([place]) Mammogram 03/02/2016: Hypoechoic masses in the upper outer quadrant left breast 2 adjacent irregular masses 2.5-1.4 x 1.2 cm and the other 2.7 x  1.4 x 2.3 cm total span 6.4 cm, enlarged multiple axillary lymph nodes measuring 1.7 cm, T2 N1/N2 stage IIb vs IIIa Left breast biopsy 03/19/2016 12:30 position: Invasive ductal carcinoma with DCIS, 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  PREVENT trial: [facility] [id number]: Patient is enrolled in this trial atorvastatin versus placebo No side effects related to the study drug.  Treatment summary:  1. Completed 4 cycles of dose dense Adriamycin and Cytoxan; and a 12 cycles ofAbraxanefrom 04/05/2016 to 08/16/2016 2. left lumpectomy 09/12/2016: IDC 0.5 cm, DCIS 1 cm, margins negative, 1/4 lymph nodes positive, T1aN1 stage II a pathologic stage 3. Adjuvant radiation 10/22/2016 to 12/11/2016 4. Adjuvant Xeloda 1500 mg twice daily 2 weeks on one week off started 01/07/2017, increasedthe dose to 2000g by mouth twice a day from2/26/2018 completed 05/27/2017 Patient refused participation  [id number] ------------------------------------------------------------------------------------------------------------------------------------ Surveillance: 1. Breast Exam2/08/2020: Benign 2. Mammogram: 04/05/2020: Stable left lumpectomy site.  Benign Density cat D  Hospitalization November 2019: C1-C2 vertebral body fractures from a fall from the ladder  Neck pain resolved after surgery. She is planning to take her RV and travel to [place] for 5 months and then to [place] and [place] and later landing in [place].  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  With the get [place] working for [employer] park.  Return to clinic on an as-needed basis.    No orders of the defined types were placed in this encounter.  The patient has a good understanding of the overall plan. she agrees with it. she will call with any problems that may develop before the next visit here.  Total time spent: 20 mins including face to face time and time spent for planning, charting and coordination of care  Rulon Eisenmenger, MD, MPH 01/30/2021  I, Cloyde Reams Dorshimer, am acting as scribe for Dr. Nicholas Lose.  I have reviewed the above documentation for accuracy and completeness, and I agree with the above.

## 2021-01-30 ENCOUNTER — Inpatient Hospital Stay: Payer: Medicare PPO | Attending: Hematology and Oncology | Admitting: Hematology and Oncology

## 2021-01-30 ENCOUNTER — Other Ambulatory Visit: Payer: Self-pay

## 2021-01-30 DIAGNOSIS — C50412 Malignant neoplasm of upper-outer quadrant of left female breast: Secondary | ICD-10-CM

## 2021-01-30 DIAGNOSIS — Z923 Personal history of irradiation: Secondary | ICD-10-CM | POA: Insufficient documentation

## 2021-01-30 DIAGNOSIS — Z01818 Encounter for other preprocedural examination: Secondary | ICD-10-CM | POA: Diagnosis not present

## 2021-01-30 DIAGNOSIS — Z853 Personal history of malignant neoplasm of breast: Secondary | ICD-10-CM | POA: Diagnosis not present

## 2021-01-30 DIAGNOSIS — Z9221 Personal history of antineoplastic chemotherapy: Secondary | ICD-10-CM | POA: Diagnosis not present

## 2021-01-30 DIAGNOSIS — G5762 Lesion of plantar nerve, left lower limb: Secondary | ICD-10-CM | POA: Diagnosis not present

## 2021-02-02 ENCOUNTER — Other Ambulatory Visit (HOSPITAL_COMMUNITY)
Admission: RE | Admit: 2021-02-02 | Discharge: 2021-02-02 | Disposition: A | Discharge status: Home / Self Care | Payer: Medicare PPO | Source: Ambulatory Visit | Attending: Sports Medicine | Admitting: Sports Medicine

## 2021-02-02 DIAGNOSIS — Z01812 Encounter for preprocedural laboratory examination: Secondary | ICD-10-CM | POA: Diagnosis not present

## 2021-02-02 DIAGNOSIS — Z20822 Contact with and (suspected) exposure to covid-19: Secondary | ICD-10-CM | POA: Diagnosis not present

## 2021-02-03 LAB — SARS CORONAVIRUS 2 (TAT 6-24 HRS): SARS Coronavirus 2: NEGATIVE

## 2021-02-06 ENCOUNTER — Ambulatory Visit (HOSPITAL_BASED_OUTPATIENT_CLINIC_OR_DEPARTMENT_OTHER): Payer: Medicare PPO | Admitting: Certified Registered"

## 2021-02-06 ENCOUNTER — Ambulatory Visit (HOSPITAL_BASED_OUTPATIENT_CLINIC_OR_DEPARTMENT_OTHER)
Admission: RE | Admit: 2021-02-06 | Discharge: 2021-02-06 | Disposition: A | Discharge status: Home / Self Care | Payer: Medicare PPO | Attending: Sports Medicine | Admitting: Sports Medicine

## 2021-02-06 ENCOUNTER — Encounter (HOSPITAL_BASED_OUTPATIENT_CLINIC_OR_DEPARTMENT_OTHER): Payer: Self-pay | Admitting: Sports Medicine

## 2021-02-06 ENCOUNTER — Encounter (HOSPITAL_BASED_OUTPATIENT_CLINIC_OR_DEPARTMENT_OTHER)
Admission: RE | Disposition: A | Discharge status: Home / Self Care | Payer: Self-pay | Source: Home / Self Care | Attending: Sports Medicine

## 2021-02-06 ENCOUNTER — Ambulatory Visit: Payer: Medicare PPO | Admitting: Hematology and Oncology

## 2021-02-06 ENCOUNTER — Other Ambulatory Visit: Payer: Self-pay

## 2021-02-06 DIAGNOSIS — Z9989 Dependence on other enabling machines and devices: Secondary | ICD-10-CM | POA: Diagnosis not present

## 2021-02-06 DIAGNOSIS — Z809 Family history of malignant neoplasm, unspecified: Secondary | ICD-10-CM | POA: Insufficient documentation

## 2021-02-06 DIAGNOSIS — Z807 Family history of other malignant neoplasms of lymphoid, hematopoietic and related tissues: Secondary | ICD-10-CM | POA: Insufficient documentation

## 2021-02-06 DIAGNOSIS — Z9221 Personal history of antineoplastic chemotherapy: Secondary | ICD-10-CM | POA: Insufficient documentation

## 2021-02-06 DIAGNOSIS — Z79899 Other long term (current) drug therapy: Secondary | ICD-10-CM | POA: Insufficient documentation

## 2021-02-06 DIAGNOSIS — Z853 Personal history of malignant neoplasm of breast: Secondary | ICD-10-CM | POA: Diagnosis not present

## 2021-02-06 DIAGNOSIS — M719 Bursopathy, unspecified: Secondary | ICD-10-CM | POA: Diagnosis not present

## 2021-02-06 DIAGNOSIS — G620 Drug-induced polyneuropathy: Secondary | ICD-10-CM | POA: Diagnosis not present

## 2021-02-06 DIAGNOSIS — D3613 Benign neoplasm of peripheral nerves and autonomic nervous system of lower limb, including hip: Secondary | ICD-10-CM | POA: Diagnosis not present

## 2021-02-06 DIAGNOSIS — G5762 Lesion of plantar nerve, left lower limb: Secondary | ICD-10-CM | POA: Insufficient documentation

## 2021-02-06 DIAGNOSIS — F418 Other specified anxiety disorders: Secondary | ICD-10-CM | POA: Diagnosis not present

## 2021-02-06 DIAGNOSIS — Z8249 Family history of ischemic heart disease and other diseases of the circulatory system: Secondary | ICD-10-CM | POA: Insufficient documentation

## 2021-02-06 DIAGNOSIS — G4733 Obstructive sleep apnea (adult) (pediatric): Secondary | ICD-10-CM | POA: Diagnosis not present

## 2021-02-06 DIAGNOSIS — Z803 Family history of malignant neoplasm of breast: Secondary | ICD-10-CM | POA: Insufficient documentation

## 2021-02-06 DIAGNOSIS — M79672 Pain in left foot: Secondary | ICD-10-CM | POA: Insufficient documentation

## 2021-02-06 DIAGNOSIS — G5792 Unspecified mononeuropathy of left lower limb: Secondary | ICD-10-CM | POA: Diagnosis not present

## 2021-02-06 DIAGNOSIS — M1611 Unilateral primary osteoarthritis, right hip: Secondary | ICD-10-CM | POA: Insufficient documentation

## 2021-02-06 DIAGNOSIS — Z888 Allergy status to other drugs, medicaments and biological substances status: Secondary | ICD-10-CM | POA: Diagnosis not present

## 2021-02-06 HISTORY — PX: MASS EXCISION: SHX2000

## 2021-02-06 SURGERY — EXCISION MASS
Anesthesia: Monitor Anesthesia Care | Site: Foot | Laterality: Left

## 2021-02-06 MED ORDER — BUPIVACAINE HCL (PF) 0.25 % IJ SOLN
INTRAMUSCULAR | Status: DC | PRN
  Administered 2021-02-06: 10 mL

## 2021-02-06 MED ORDER — FENTANYL CITRATE (PF) 100 MCG/2ML IJ SOLN
INTRAMUSCULAR | Status: AC
  Filled 2021-02-06: qty 2

## 2021-02-06 MED ORDER — ATROPINE SULFATE 0.4 MG/ML IJ SOLN
INTRAMUSCULAR | Status: AC
  Filled 2021-02-06: qty 1

## 2021-02-06 MED ORDER — MIDAZOLAM HCL 2 MG/2ML IJ SOLN
INTRAMUSCULAR | Status: AC
  Filled 2021-02-06: qty 2

## 2021-02-06 MED ORDER — BUPIVACAINE HCL (PF) 0.25 % IJ SOLN
INTRAMUSCULAR | Status: AC
  Filled 2021-02-06: qty 30

## 2021-02-06 MED ORDER — DEXAMETHASONE SODIUM PHOSPHATE 10 MG/ML IJ SOLN
INTRAMUSCULAR | Status: AC
  Filled 2021-02-06: qty 1

## 2021-02-06 MED ORDER — ACETAMINOPHEN 500 MG PO TABS: 1000.0000 mg | ORAL_TABLET | Freq: Once | ORAL | Status: DC | PRN

## 2021-02-06 MED ORDER — MIDAZOLAM HCL 5 MG/5ML IJ SOLN
INTRAMUSCULAR | Status: DC | PRN
  Administered 2021-02-06: 2 mg via INTRAVENOUS

## 2021-02-06 MED ORDER — ACETAMINOPHEN 10 MG/ML IV SOLN: 1000.0000 mg | Freq: Once | INTRAVENOUS | Status: DC | PRN

## 2021-02-06 MED ORDER — LIDOCAINE HCL (PF) 1 % IJ SOLN
INTRAMUSCULAR | Status: AC
  Filled 2021-02-06: qty 30

## 2021-02-06 MED ORDER — OXYCODONE HCL 5 MG/5ML PO SOLN: 5.0000 mg | Freq: Once | ORAL | Status: AC | PRN

## 2021-02-06 MED ORDER — IBUPROFEN 800 MG PO TABS: 800.0000 mg | ORAL_TABLET | Freq: Three times a day (TID) | ORAL | 0 refills | Status: DC | PRN

## 2021-02-06 MED ORDER — CEFAZOLIN SODIUM-DEXTROSE 2-4 GM/100ML-% IV SOLN
INTRAVENOUS | Status: AC
  Filled 2021-02-06: qty 100

## 2021-02-06 MED ORDER — HYDROCODONE-ACETAMINOPHEN 10-325 MG PO TABS: 1.0000 | ORAL_TABLET | Freq: Four times a day (QID) | ORAL | 0 refills | Status: AC | PRN

## 2021-02-06 MED ORDER — FENTANYL CITRATE (PF) 100 MCG/2ML IJ SOLN: 25.0000 ug | INTRAMUSCULAR | Status: DC | PRN

## 2021-02-06 MED ORDER — CEFAZOLIN SODIUM-DEXTROSE 2-4 GM/100ML-% IV SOLN
2.0000 g | INTRAVENOUS | Status: AC
  Administered 2021-02-06: 2 g via INTRAVENOUS

## 2021-02-06 MED ORDER — CHLORHEXIDINE GLUCONATE CLOTH 2 % EX PADS: 6.0000 | MEDICATED_PAD | Freq: Once | CUTANEOUS | Status: DC

## 2021-02-06 MED ORDER — FENTANYL CITRATE (PF) 100 MCG/2ML IJ SOLN
INTRAMUSCULAR | Status: DC | PRN
  Administered 2021-02-06: 100 ug via INTRAVENOUS

## 2021-02-06 MED ORDER — LACTATED RINGERS IV SOLN: INTRAVENOUS | Status: DC

## 2021-02-06 MED ORDER — ONDANSETRON HCL 4 MG/2ML IJ SOLN
INTRAMUSCULAR | Status: DC | PRN
  Administered 2021-02-06: 4 mg via INTRAVENOUS

## 2021-02-06 MED ORDER — ONDANSETRON HCL 4 MG/2ML IJ SOLN
INTRAMUSCULAR | Status: AC
  Filled 2021-02-06: qty 2

## 2021-02-06 MED ORDER — 0.9 % SODIUM CHLORIDE (POUR BTL) OPTIME
TOPICAL | Status: DC | PRN
  Administered 2021-02-06: 200 mL

## 2021-02-06 MED ORDER — LIDOCAINE HCL 1 % IJ SOLN
INTRAMUSCULAR | Status: DC | PRN
  Administered 2021-02-06: 10 mL

## 2021-02-06 MED ORDER — PROPOFOL 500 MG/50ML IV EMUL
INTRAVENOUS | Status: DC | PRN
  Administered 2021-02-06: 200 ug/kg/min via INTRAVENOUS

## 2021-02-06 MED ORDER — PROMETHAZINE HCL 12.5 MG PO TABS: 12.5000 mg | ORAL_TABLET | Freq: Four times a day (QID) | ORAL | 0 refills | Status: DC | PRN

## 2021-02-06 MED ORDER — ACETAMINOPHEN 160 MG/5ML PO SOLN: 1000.0000 mg | Freq: Once | ORAL | Status: DC | PRN

## 2021-02-06 MED ORDER — OXYCODONE HCL 5 MG PO TABS
5.0000 mg | ORAL_TABLET | Freq: Once | ORAL | Status: AC | PRN
Start: 2021-02-06 — End: 2021-02-06
  Administered 2021-02-06: 5 mg via ORAL

## 2021-02-06 MED ORDER — SUCCINYLCHOLINE CHLORIDE 200 MG/10ML IV SOSY
PREFILLED_SYRINGE | INTRAVENOUS | Status: AC
  Filled 2021-02-06: qty 10

## 2021-02-06 MED ORDER — OXYCODONE HCL 5 MG PO TABS
ORAL_TABLET | ORAL | Status: AC
  Filled 2021-02-06: qty 1

## 2021-02-06 SURGICAL SUPPLY — 52 items
APL SKNCLS STERI-STRIP NONHPOA (GAUZE/BANDAGES/DRESSINGS) ×1
BENZOIN TINCTURE PRP APPL 2/3 (GAUZE/BANDAGES/DRESSINGS) ×2 IMPLANT
BLADE SURG 15 STRL LF DISP TIS (BLADE) ×2 IMPLANT
BLADE SURG 15 STRL SS (BLADE) ×4
BNDG CMPR 9X4 STRL LF SNTH (GAUZE/BANDAGES/DRESSINGS) ×1
BNDG COHESIVE 3X5 TAN STRL LF (GAUZE/BANDAGES/DRESSINGS) ×2 IMPLANT
BNDG CONFORM 2 STRL LF (GAUZE/BANDAGES/DRESSINGS) ×2 IMPLANT
BNDG ELASTIC 3X5.8 VLCR STR LF (GAUZE/BANDAGES/DRESSINGS) IMPLANT
BNDG ESMARK 4X9 LF (GAUZE/BANDAGES/DRESSINGS) ×2 IMPLANT
BNDG GAUZE ELAST 4 BULKY (GAUZE/BANDAGES/DRESSINGS) IMPLANT
COVER BACK TABLE 60X90IN (DRAPES) ×2 IMPLANT
COVER WAND RF STERILE (DRAPES) IMPLANT
CUFF TOURN SGL QUICK 18X4 (TOURNIQUET CUFF) ×2 IMPLANT
DRAPE EXTREMITY T 121X128X90 (DISPOSABLE) ×2 IMPLANT
DRAPE IMP U-DRAPE 54X76 (DRAPES) ×2 IMPLANT
DRAPE U-SHAPE 47X51 STRL (DRAPES) ×2 IMPLANT
DRSG EMULSION OIL 3X3 NADH (GAUZE/BANDAGES/DRESSINGS) ×2 IMPLANT
DRSG PAD ABDOMINAL 8X10 ST (GAUZE/BANDAGES/DRESSINGS) IMPLANT
DURAPREP 26ML APPLICATOR (WOUND CARE) ×2 IMPLANT
ELECT REM PT RETURN 9FT ADLT (ELECTROSURGICAL) ×2
ELECTRODE REM PT RTRN 9FT ADLT (ELECTROSURGICAL) ×1 IMPLANT
GAUZE 4X4 16PLY RFD (DISPOSABLE) ×2 IMPLANT
GAUZE SPONGE 4X4 12PLY STRL (GAUZE/BANDAGES/DRESSINGS) ×2 IMPLANT
GAUZE XEROFORM 1X8 LF (GAUZE/BANDAGES/DRESSINGS) IMPLANT
GLOVE SURG ENC MOIS LTX SZ6.5 (GLOVE) ×2 IMPLANT
GLOVE SURG UNDER POLY LF SZ6.5 (GLOVE) ×4 IMPLANT
GOWN STRL REUS W/ TWL LRG LVL3 (GOWN DISPOSABLE) ×1 IMPLANT
GOWN STRL REUS W/TWL LRG LVL3 (GOWN DISPOSABLE) ×2
NDL SAFETY ECLIPSE 18X1.5 (NEEDLE) IMPLANT
NEEDLE HYPO 18GX1.5 SHARP (NEEDLE)
NEEDLE HYPO 25X1 1.5 SAFETY (NEEDLE) ×4 IMPLANT
NS IRRIG 1000ML POUR BTL (IV SOLUTION) ×2 IMPLANT
PACK BASIN DAY SURGERY FS (CUSTOM PROCEDURE TRAY) ×2 IMPLANT
PADDING CAST ABS 4INX4YD NS (CAST SUPPLIES)
PADDING CAST ABS COTTON 4X4 ST (CAST SUPPLIES) IMPLANT
PENCIL SMOKE EVACUATOR (MISCELLANEOUS) ×2 IMPLANT
SLEEVE SCD COMPRESS KNEE MED (STOCKING) IMPLANT
STOCKINETTE 6  STRL (DRAPES) ×2
STOCKINETTE 6 STRL (DRAPES) ×1 IMPLANT
STOCKINETTE SYNTHETIC 4 NONSTR (MISCELLANEOUS) IMPLANT
STRIP CLOSURE SKIN 1/4X4 (GAUZE/BANDAGES/DRESSINGS) ×2 IMPLANT
STRIP SUTURE WOUND CLOSURE 1/2 (MISCELLANEOUS) IMPLANT
SUT ETHILON 3 0 PS 1 (SUTURE) IMPLANT
SUT MERSILENE 2.0 SH NDLE (SUTURE) IMPLANT
SUT MNCRL AB 3-0 PS2 18 (SUTURE) IMPLANT
SUT MNCRL AB 4-0 PS2 18 (SUTURE) IMPLANT
SUT MON AB 5-0 PS2 18 (SUTURE) IMPLANT
SUT VIC AB 2-0 PS2 27 (SUTURE) ×2 IMPLANT
SYR 10ML LL (SYRINGE) ×4 IMPLANT
SYR BULB EAR ULCER 3OZ GRN STR (SYRINGE) ×2 IMPLANT
TOWEL GREEN STERILE FF (TOWEL DISPOSABLE) ×2 IMPLANT
UNDERPAD 30X36 HEAVY ABSORB (UNDERPADS AND DIAPERS) IMPLANT

## 2021-02-06 NOTE — Anesthesia Preprocedure Evaluation (Signed)
Anesthesia Evaluation  Patient identified by MRN, date of birth, ID band Patient awake    Reviewed: Allergy & Precautions, NPO status , Patient's Chart, lab work & pertinent test results  History of Anesthesia Complications Negative for: history of anesthetic complications  Airway Mallampati: III  TM Distance: <3 FB Neck ROM: Full    Dental  (+) Dental Advisory Given, Teeth Intact   Pulmonary neg shortness of breath, sleep apnea and Continuous Positive Airway Pressure Ventilation , neg COPD, neg recent URI,  Covid-19 Nucleic Acid Test Results Lab Results      Component                Value               Date                      [place]              NEGATIVE            02/02/2021                [place]              NEGATIVE            11/28/2020                [place]              NEGATIVE            05/16/2020                [place]              NEGATIVE            01/20/2020                [place]              NOT DETECTED        05/15/2019              breath sounds clear to auscultation       Cardiovascular (-) hypertension(-) angina(-) Past MI negative cardio ROS   Rhythm:Regular  - Left ventricle: The cavity size was normal. Wall thickness was  normal. Systolic function was normal. The estimated ejection  fraction was in the range of 60% to 65%. Wall motion was normal;  there were no regional wall motion abnormalities. Features are  consistent with a pseudonormal left ventricular filling pattern,  with concomitant abnormal relaxation and increased filling  pressure (grade 2 diastolic dysfunction).  - Pulmonary arteries: PA peak pressure: 33 mm Hg (S).  - Impressions: Lateral s&' = 10.5 cm/sec. GLS -23.4%    Neuro/Psych  Headaches, neg Seizures PSYCHIATRIC DISORDERS Anxiety Depression    GI/Hepatic Neg liver ROS, GERD  ,  Endo/Other  negative endocrine ROS  Renal/GU negative Renal ROS      Musculoskeletal  (+) Arthritis ,   Abdominal   Peds  Hematology negative hematology ROS (+) Lab Results      Component                Value               Date                      WBC  6.2                 05/10/2020                HGB                      12.6                05/10/2020                HCT                      40.2                05/10/2020                MCV                      95.9                05/10/2020                PLT                      228                 05/10/2020              Anesthesia Other Findings   Reproductive/Obstetrics                             Anesthesia Physical Anesthesia Plan  ASA: II  Anesthesia Plan: MAC   Post-op Pain Management:    Induction: Intravenous  PONV Risk Score and Plan: 2 and Propofol infusion and Treatment may vary due to age or medical condition  Airway Management Planned: Nasal Cannula  Additional Equipment: None  Intra-op Plan:   Post-operative Plan:   Informed Consent: I have reviewed the patients History and Physical, chart, labs and discussed the procedure including the risks, benefits and alternatives for the proposed anesthesia with the patient or authorized representative who has indicated his/her understanding and acceptance.     Dental advisory given  Plan Discussed with: CRNA and Surgeon  Anesthesia Plan Comments:         Anesthesia Quick Evaluation

## 2021-02-06 NOTE — Interval H&P Note (Signed)
Anesthesia H&P Update: History and Physical Exam reviewed; patient is OK for planned anesthetic and procedure. ? ?

## 2021-02-06 NOTE — Op Note (Signed)
DATE OF OPERATION: 02-06-21  PREOPERATIVE DIAGNOSES:  1.  Neuroma left foot 2.  Bursitis left foot 3.  Left foot pain  POSTOPERATIVE DIAGNOSIS:  Same  OPERATION PERFORMED:  1.  Excision of neuroma left foot second and third and third and fourth web spaces   SURGEON: Landis Martins, DPM   ASSISTANT: None  ESTIMATED BLOOD LOSS:  Minimal.   HEMOSTASIS:  Ankle tourniquet   INJECTABLES:  Pre-and post 10 mL of 1% lidocaine plain and 0.5% Marcaine plain.   INDICATIONS FOR OPERATION:  The patient is a 65 y.o. female with clinical and radiographic signs and symptoms consistent with the above-stated diagnosis.The patient has chosen surgical management of the diagnosis and has consented for surgery. All risks, benefits and complications have been explained to the patient including but not limited to recurrence of the painful scar, recurrence of inflamed nerve, and deviation of toes, the patient understands. No guarantees were made or implied to the outcome of the surgery.   DESCRIPTION OF OPERATION:  The patient was brought into the operating room and positioned in OR table in the supine position. Following appropriate padding of all bony areas, local anesthesia was administered using injectable as above and tourniquet was placed at left ankle but not yet inflated. Following the foot was then prepped and draped.   Attention was directed towards the left first through fifth toes where anesthesia was confirmed and then using a 15 blade a dorsal longitudinal incision was made centered over the second and third webspaces corresponding to the second and third and third and fourth toes.  The incision was deepened through skin and subcutaneous tissue with care to protect and retract all vital neurovascular structures.  Once down to the level of the transverse intermetatarsal ligament a linear release was made freeing up underlying soft tissue exposing the inflamed nerve at the corresponding interspaces  the nerve was carefully dissected using a combination of sharp and blunt dissection and once the nerve was completely identified and transected proximally with passed off to the back table to be sent to pathology at both corresponding interspaces.  Bovie was used to cauterize any bleeding tissue and then the area was copiously flushed with normal saline solution.  The subcutaneous tissue over the incision areas were coapted using 2-0 Vicryl.  The skin was re-coapted utilizing 4-0 Monocryl in a running suture fashion. A dry sterile dressing was applied and the tourniquet was rapidly deflated and prompt instantaneous hyperemic response was noted to all aspects of the patient's left lower extremity with digital capillary refill time measuring less than 3 seconds to digits 1-5 of the left foot.  The patient tolerated the procedure and local anesthesia well and was transported from the operating room to the recovery with vital signs stable and vascular status intact to all aspects of the patient's left lower extremity.  Following a period of postoperative monitoring, the patient was discharged home with oral and written instructions per Dr. Cannon Kettle. The patient was given postop prescriptions. The patient will remain weightbearing with surgical shoe to heel  however advised to limit activity to prevent postoperative swelling and was informed to keep dressing clean dry and intact and to follow up in office in 1 week for continued post op care.  Landis Martins, DPM

## 2021-02-06 NOTE — Discharge Instructions (Signed)
Attached to paper chart  Post Anesthesia Home Care Instructions  Activity: Get plenty of rest for the remainder of the day. A responsible individual must stay with you for 24 hours following the procedure.  For the next 24 hours, DO NOT: -Drive a car -Paediatric nurse -Drink alcoholic beverages -Take any medication unless instructed by your physician -Make any legal decisions or sign important papers.  Meals: Start with liquid foods such as gelatin or soup. Progress to regular foods as tolerated. Avoid greasy, spicy, heavy foods. If nausea and/or vomiting occur, drink only clear liquids until the nausea and/or vomiting subsides. Call your physician if vomiting continues.  Special Instructions/Symptoms: Your throat may feel dry or sore from the anesthesia or the breathing tube placed in your throat during surgery. If this causes discomfort, gargle with warm salt water. The discomfort should disappear within 24 hours.  If you had a scopolamine patch placed behind your ear for the management of post- operative nausea and/or vomiting:  1. The medication in the patch is effective for 72 hours, after which it should be removed.  Wrap patch in a tissue and discard in the trash. Wash hands thoroughly with soap and water. 2. You may remove the patch earlier than 72 hours if you experience unpleasant side effects which may include dry mouth, dizziness or visual disturbances. 3. Avoid touching the patch. Wash your hands with soap and water after contact with the patch.    Regional Anesthesia Blocks  1. Numbness or the inability to move the "blocked" extremity may last from 3-48 hours after placement. The length of time depends on the medication injected and your individual response to the medication. If the numbness is not going away after 48 hours, call your surgeon.  2. The extremity that is blocked will need to be protected until the numbness is gone and the  Strength has returned. Because you  cannot feel it, you will need to take extra care to avoid injury. Because it may be weak, you may have difficulty moving it or using it. You may not know what position it is in without looking at it while the block is in effect.  3. For blocks in the legs and feet, returning to weight bearing and walking needs to be done carefully. You will need to wait until the numbness is entirely gone and the strength has returned. You should be able to move your leg and foot normally before you try and bear weight or walk. You will need someone to be with you when you first try to ensure you do not fall and possibly risk injury.  4. Bruising and tenderness at the needle site are common side effects and will resolve in a few days.  5. Persistent numbness or new problems with movement should be communicated to the surgeon or the [facility] [phone] [place] [phone]).

## 2021-02-06 NOTE — Brief Op Note (Signed)
02/06/2021  9:47 AM  PATIENT:  Chalmers Cater Montee  65 y.o. female  PRE-OPERATIVE DIAGNOSIS:  NEUROMA OF LEFT FOOT  POST-OPERATIVE DIAGNOSIS:  NEUROMA OF LEFT FOOT  PROCEDURE:  Procedure(s): EXCISION LEFT FOOT SOFT TISSUE BETWEEN SECOND AND THIRD AND THIRD AND FOURTH TOES (Left)  SURGEON:  Surgeon(s) and Role:    Landis Martins, DPM - Primary  PHYSICIAN ASSISTANT:   ASSISTANTS: none   ANESTHESIA:   MAC and Local  EBL:  10 mL   BLOOD ADMINISTERED:none  DRAINS: none   LOCAL MEDICATIONS USED:  MARCAINE AND LIDOCAINE PLAIN     SPECIMEN:  Source of Specimen:  left foot soft tissue  DISPOSITION OF SPECIMEN:  PATHOLOGY  COUNTS:  YES  TOURNIQUET:   Total Tourniquet Time Documented: Calf (Left) - 40 minutes Total: Calf (Left) - 40 minutes   DICTATION: .Note written in EPIC  PLAN OF CARE: Discharge to home after PACU  PATIENT DISPOSITION:  PACU - hemodynamically stable.   Delay start of Pharmacological VTE agent (>24hrs) due to surgical blood loss or risk of bleeding: no

## 2021-02-06 NOTE — H&P (Signed)
H&P: Podiatry   [id number] Alexis Fox 65 y.o. female  patient with a history of left foot pain seen on several occassions in office complaining of pain to ball of left foot; Patient has tried multiple conservative treatments and opted for Surgical management. Patient had pre-op physical and clearance for excision of neuroma and inflammed tissue left foot completed. Patient met this AM in pre-op holding area; informed consent reviewed and signed. All questions answered. Left foot/surgical site marked.  This am patient denies any other pedal complaints. Denies Nausea/fever/vomiting/chills/night sweats/overnight events. Confirms NPO since midnight.   Patient Active Problem List   Diagnosis Date Noted  . S/P cervical spinal fusion 01/22/2020  . Memory deficit 01/14/2020  . Unilateral primary osteoarthritis, right hip 09/01/2019  . Trochanteric bursitis, left hip 07/14/2019  . S/P lumbar fusion 05/18/2019  . OSA (obstructive sleep apnea) 10/28/2018  . Pain managed using patient-controlled analgesia (PCA) 10/25/2018  . Fall 10/24/2018  . DDD (degenerative disc disease), lumbosacral 09/15/2018  . Chemotherapy-induced neuropathy ([place]) 09/15/2018  . Central line complication 83/25/4982  . Genetic testing 05/15/2016  . Family history of breast cancer   . Family history of brain cancer   . Breast cancer of upper-outer quadrant of left female breast ([place]) 03/20/2016  . Transaminitis 10/03/2015  . Symptomatic cholelithiasis 10/03/2015  . RUQ abdominal pain   . Depression 06/24/2013  . RLS (restless legs syndrome) 06/24/2013    Current Facility-Administered Medications on File Prior to Encounter  Medication Dose Route Frequency Provider Last Rate Last Admin  . sodium chloride flush (NS) 0.9 % injection 10 mL  10 mL Intracatheter PRN Nicholas Lose, MD   10 mL at 07/19/16 1434   Current Outpatient Medications on File Prior to Encounter  Medication Sig Dispense Refill  . buPROPion  (WELLBUTRIN XL) 150 MG 24 hr tablet Take 150 mg by mouth daily.  4  . cetirizine (ZYRTEC) 10 MG tablet Take 10 mg by mouth at bedtime.     . Cholecalciferol (VITAMIN D3) 50 MCG (2000 UT) TABS Take 2,000 Units by mouth daily.     . citalopram (CELEXA) 40 MG tablet Take 40 mg by mouth at bedtime.   3  . clonazePAM (KLONOPIN) 1 MG tablet Take 1 mg by mouth at bedtime.     . cycloSPORINE (RESTASIS) 0.05 % ophthalmic emulsion Place 1 drop into both eyes 2 (two) times daily.    Marland Kitchen docusate sodium (COLACE) 100 MG capsule Take 100 mg by mouth 2 (two) times daily.    . Iron-Vitamin C (IRON 100/C PO) Take 1 tablet by mouth in the morning and at bedtime.     . pramipexole (MIRAPEX) 0.5 MG tablet Take 1/2 tab at 4 pm and 1 tab at night time prn. (Patient taking differently: Take 0.5 mg by mouth at bedtime.) 135 tablet 3  . Esomeprazole Magnesium 20 MG TBEC Take 20 mg by mouth daily before breakfast.  (Patient not taking: Reported on 08/10/2020)    . zolpidem (AMBIEN) 5 MG tablet Take 5 mg by mouth at bedtime.  1     Allergies  Allergen Reactions  . Turmeric Diarrhea    Severe diarrhea  . Decadron [Dexamethasone]     Exacerbates restless leg    Social History   Socioeconomic History  . Marital status: Married    Spouse name: Not on file  . Number of children: 2  . Years of education: Not on file  . Highest education level: Not on file  Occupational History  .  Not on file  Tobacco Use  . Smoking status: Never Smoker  . Smokeless tobacco: Never Used  Vaping Use  . Vaping Use: Never used  Substance and Sexual Activity  . Alcohol use: Yes    Alcohol/week: 1.0 standard drink    Types: 1 Cans of beer per week    Comment: occasional  . Drug use: No  . Sexual activity: Not Currently    Birth control/protection: Post-menopausal  Other Topics Concern  . Not on file  Social History Narrative  . Not on file   Social Determinants of Health   Financial Resource Strain: Not on file  Food  Insecurity: Not on file  Transportation Needs: Not on file  Physical Activity: Not on file  Stress: Not on file  Social Connections: Not on file  Intimate Partner Violence: Not on file    Past Surgical History:  Procedure Laterality Date  . ANTERIOR CERVICAL DECOMP/DISCECTOMY FUSION N/A 01/22/2020   Procedure: ACDF - C2-C3;  Surgeon: Eustace Moore, MD;  Location: [place];  Service: Neurosurgery;  Laterality: N/A;  . BACK SURGERY     x2  . BREAST BIOPSY    . BREAST LUMPECTOMY Left 2018  . BUNIONECTOMY    . CHOLECYSTECTOMY N/A 10/04/2015   Procedure: LAPAROSCOPIC CHOLECYSTECTOMY WITH INTRAOPERATIVE CHOLANGIOGRAM;  Surgeon: Greer Pickerel, MD;  Location: [place];  Service: General;  Laterality: N/A;  . ELBOW SURGERY     right for epicondylitis 2010   . ESOPHAGEAL MANOMETRY N/A 11/30/2020   Procedure: ESOPHAGEAL MANOMETRY (EM);  Surgeon: Ronnette Juniper, MD;  Location: [facility];  Service: Gastroenterology;  Laterality: N/A;  . FOOT SURGERY     1983 -tarsal tunnel release  . IR GENERIC HISTORICAL  10/26/2016   IR CV LINE INJECTION 10/26/2016 [facility]INTER[facility]  . LUMBAR DISC SURGERY  04/04/2012  . PORT-A-CATH REMOVAL N/A 11/15/2016   Procedure: REMOVAL PORT-A-CATH;  Surgeon: Rolm Bookbinder, MD;  Location: [place];  Service: General;  Laterality: N/A;  . PORTACATH PLACEMENT Right 04/02/2016   Procedure: INSERTION PORT-A-CATH WITH [place];  Surgeon: Rolm Bookbinder, MD;  Location: [place];  Service: General;  Laterality: Right;  . RADIOACTIVE SEED GUIDED PARTIAL MASTECTOMY/AXILLARY SENTINEL NODE BIOPSY/AXILLARY NODE DISSECTION Left 09/12/2016   Procedure: LEFT BREAST SEED GUIDED LUMPECTOMY, LEFT AXILLARY SENTINEL NODE BIOPSY, LEFT SEED GUIDED AXILLARY NODE EXCISION( TARGETED AXILLARY DISSECTION), BLUE DYE INJECTION;  Surgeon: Rolm Bookbinder, MD;  Location: [place];  Service: General;  Laterality: Left;  . SACROILIAC JOINT FUSION Left 05/18/2020    Procedure: LEFT SACROILIAC JOINT FUSION;  Surgeon: Phylliss Bob, MD;  Location: [place];  Service: Orthopedics;  Laterality: Left;  . SPINAL FUSION  5/12   L5-S1  . SPINAL FUSION  05/18/2019   L4-5  . TARSAL TUNNEL RELEASE      Family History  Problem Relation Age of Onset  . Heart disease Mother   . Pulmonary fibrosis Mother   . Hypertension Mother   . Cancer Father 22       astocytoma  . Hypertension Brother   . Lymphoma Maternal Aunt   . Anesthesia problems Neg Hx      REVIEW OF SYSTEMS: Noncontributory   PHYSICAL EXAMINATION:  Today's Vitals   01/30/21 1205 02/06/21 0701  BP:  (!) 124/57  Pulse:  67  Resp:  18  Temp:  97.7 F (36.5 C)  TempSrc:  Oral  SpO2:  99%  Weight: 98.9 kg 98.5 kg  Height: 5' 4"  (  1.626 m) 5' 4"  (1.626 m)  PainSc:  0-No pain    GENERAL: Well-developed, well-nourished, in no acute distress. Alert  and cooperative.  Physical exam unchanged from prior.  Dermatology: Old surgical scars well healed on left, No open lesions bilateral lower extremities, no webspace macerations, no ecchymosis bilateral, all nails x 10 are well manicured.  Vascular: Dorsalis Pedis and Posterior Tibial pedal pulses palpable, Capillary Fill Time 3 seconds,(+) pedal hair growth bilateral, no edema bilateral lower extremities, Temperature gradient within normal limits.  Neurology: Gross sensation intact via light touch bilateral,Numbness and tingling to Left 2-3 toes, negative moulders sign but the met heads are in close proximity like before.   Musculoskeletal: + tenderness with palpation at Left 2nd interspace of the left foot and with palpation to ball of left foot. Strength within normal limits, no other acute findings.     MRI Left foot 11/03/20 IMPRESSION: 1. Surgical changes involving the first metatarsal head related to prior bunionectomy. No complicating features. 2. Fluid between the second and third metatarsal head suggesting metatarsal  bursitis. No obvious mass to suggest Morton's neuroma. 3. Possible small Morton's neuroma between the third and fourth metatarsal heads. Recommend clinical correlation.   ASSESSMENTS:  1.Neuroma left foot 2. Bursitis left foot 3. Left foot pain   PLAN OF CARE: Patient seen and evaluated 1. History and physical completed 2. Patient NPO since midnight 3. Previous Imaging reviewed  4. Consent for surgery explained and obtained for excision of neuroma and inflamed tissue left foot, riske and benefits explained; all questions answered and no guarantees granted. 5. Patient to undergo above surgical procedure 6. Case discussed with patient and to meet with husband represented after the procedure 7. To resume all home meds post-procedure and to give prn pain meds, anti-nausea, and anti-constipation medications post op 8. Will continue to follow closely/ see post-operative in the office within 1 week.  Landis Martins, DPM

## 2021-02-06 NOTE — Transfer of Care (Signed)
Immediate Anesthesia Transfer of Care Note  Patient: Alexis Fox  Procedure(s) Performed: EXCISION LEFT FOOT SOFT TISSUE BETWEEN SECOND AND THIRD AND THIRD AND FOURTH TOES (Left Foot)  Patient Location: PACU  Anesthesia Type:MAC and Regional  Level of Consciousness: awake, alert  and oriented  Airway & Oxygen Therapy: Patient Spontanous Breathing and Patient connected to face mask oxygen  Post-op Assessment: Report given to RN and Post -op Vital signs reviewed and stable  Post vital signs: Reviewed and stable  Last Vitals:  Vitals Value Taken Time  BP    Temp    Pulse 73 02/06/21 0948  Resp    SpO2 100 % 02/06/21 0948  Vitals shown include unvalidated device data.  Last Pain:  Vitals:   02/06/21 0701  TempSrc: Oral  PainSc: 0-No pain      Patients Stated Pain Goal: 3 (95/18/84 1660)  Complications: No complications documented.

## 2021-02-07 ENCOUNTER — Encounter (HOSPITAL_BASED_OUTPATIENT_CLINIC_OR_DEPARTMENT_OTHER): Payer: Self-pay | Admitting: Sports Medicine

## 2021-02-07 ENCOUNTER — Telehealth: Payer: Self-pay | Admitting: Sports Medicine

## 2021-02-07 LAB — SURGICAL PATHOLOGY

## 2021-02-07 NOTE — Telephone Encounter (Signed)
Post op phone call made to patient, patient reports that her foot is doing good, no pain but does have some soreness at her left jaw.I advised patient to monitor if symptoms worsen to call office. Continue with rest, ice, elevation for her foot. I also reminded her of her upcoming appointment next week. -Dr. Cannon Kettle

## 2021-02-09 ENCOUNTER — Ambulatory Visit: Payer: Medicare PPO | Admitting: Orthopaedic Surgery

## 2021-02-09 NOTE — Anesthesia Postprocedure Evaluation (Signed)
Anesthesia Post Note  Patient: Alexis Fox  Procedure(s) Performed: EXCISION LEFT FOOT SOFT TISSUE BETWEEN SECOND AND THIRD AND THIRD AND FOURTH TOES (Left Foot)     Patient location during evaluation: PACU Anesthesia Type: MAC Level of consciousness: awake and alert Pain management: pain level controlled Vital Signs Assessment: post-procedure vital signs reviewed and stable Respiratory status: spontaneous breathing, nonlabored ventilation, respiratory function stable and patient connected to nasal cannula oxygen Cardiovascular status: stable and blood pressure returned to baseline Postop Assessment: no apparent nausea or vomiting Anesthetic complications: no   No complications documented.  Last Vitals:  Vitals:   02/06/21 1015 02/06/21 1040  BP: 129/66 (!) 155/85  Pulse: 70 67  Resp: 18 16  Temp:  (!) 36.4 C  SpO2: 100% 97%    Last Pain:  Vitals:   02/07/21 0941  TempSrc:   PainSc: 6                  Daizha Anand

## 2021-02-14 ENCOUNTER — Ambulatory Visit (INDEPENDENT_AMBULATORY_CARE_PROVIDER_SITE_OTHER): Payer: Medicare PPO

## 2021-02-14 ENCOUNTER — Other Ambulatory Visit: Payer: Self-pay

## 2021-02-14 ENCOUNTER — Encounter: Payer: Self-pay | Admitting: Sports Medicine

## 2021-02-14 ENCOUNTER — Other Ambulatory Visit: Payer: Self-pay | Admitting: Sports Medicine

## 2021-02-14 ENCOUNTER — Ambulatory Visit (INDEPENDENT_AMBULATORY_CARE_PROVIDER_SITE_OTHER): Payer: Medicare PPO | Admitting: Sports Medicine

## 2021-02-14 DIAGNOSIS — M7752 Other enthesopathy of left foot: Secondary | ICD-10-CM

## 2021-02-14 DIAGNOSIS — D3610 Benign neoplasm of peripheral nerves and autonomic nervous system, unspecified: Secondary | ICD-10-CM

## 2021-02-14 DIAGNOSIS — Z9889 Other specified postprocedural states: Secondary | ICD-10-CM

## 2021-02-14 DIAGNOSIS — M779 Enthesopathy, unspecified: Secondary | ICD-10-CM | POA: Diagnosis not present

## 2021-02-14 DIAGNOSIS — L905 Scar conditions and fibrosis of skin: Secondary | ICD-10-CM

## 2021-02-14 DIAGNOSIS — M79672 Pain in left foot: Secondary | ICD-10-CM

## 2021-02-14 NOTE — Progress Notes (Signed)
Subjective: Alexis Fox is a 65 y.o. female patient seen today in office for POV #1  (DOS 02/06/21), S/P Left foot removal of inflammed nerves at left 2 and 3rd webspaces. Patient denies pain at surgical site right now but does feel some occassional sharp pains when she is walking with surgical shoe, 3/10, which is to be expected, denies calf pain, denies headache, chest pain, shortness of breath, nausea, vomiting, fever, or chills. Reports took about 5 hydrocodone since surgery. No other issues noted.   Patient Active Problem List   Diagnosis Date Noted  . S/P cervical spinal fusion 01/22/2020  . Memory deficit 01/14/2020  . Unilateral primary osteoarthritis, right hip 09/01/2019  . Trochanteric bursitis, left hip 07/14/2019  . S/P lumbar fusion 05/18/2019  . OSA (obstructive sleep apnea) 10/28/2018  . Pain managed using patient-controlled analgesia (PCA) 10/25/2018  . Fall 10/24/2018  . DDD (degenerative disc disease), lumbosacral 09/15/2018  . Chemotherapy-induced neuropathy ([place]) 09/15/2018  . Central line complication 61/44/3154  . Genetic testing 05/15/2016  . Family history of breast cancer   . Family history of brain cancer   . Breast cancer of upper-outer quadrant of left female breast ([place]) 03/20/2016  . Transaminitis 10/03/2015  . Symptomatic cholelithiasis 10/03/2015  . RUQ abdominal pain   . Depression 06/24/2013  . RLS (restless legs syndrome) 06/24/2013    Current Outpatient Medications on File Prior to Visit  Medication Sig Dispense Refill  . buPROPion (WELLBUTRIN XL) 150 MG 24 hr tablet Take 150 mg by mouth daily.  4  . cetirizine (ZYRTEC) 10 MG tablet Take 10 mg by mouth at bedtime.     . Cholecalciferol (VITAMIN D3) 50 MCG (2000 UT) TABS Take 2,000 Units by mouth daily.     . citalopram (CELEXA) 40 MG tablet Take 40 mg by mouth at bedtime.   3  . clobetasol ointment (TEMOVATE) 0.08 % Apply 1 application topically 2 (two) times daily. 30 g 1  . clonazePAM  (KLONOPIN) 1 MG tablet Take 1 mg by mouth at bedtime.     . cycloSPORINE (RESTASIS) 0.05 % ophthalmic emulsion Place 1 drop into both eyes 2 (two) times daily.    Marland Kitchen docusate sodium (COLACE) 100 MG capsule Take 100 mg by mouth 2 (two) times daily.    . Esomeprazole Magnesium 20 MG TBEC Take 20 mg by mouth daily before breakfast.  (Patient not taking: Reported on 08/10/2020)    . ibuprofen (ADVIL) 800 MG tablet Take 1 tablet (800 mg total) by mouth every 8 (eight) hours as needed. 30 tablet 0  . Iron-Vitamin C (IRON 100/C PO) Take 1 tablet by mouth in the morning and at bedtime.     . pramipexole (MIRAPEX) 0.5 MG tablet Take 1/2 tab at 4 pm and 1 tab at night time prn. (Patient taking differently: Take 0.5 mg by mouth at bedtime.) 135 tablet 3  . promethazine (PHENERGAN) 12.5 MG tablet Take 1 tablet (12.5 mg total) by mouth every 6 (six) hours as needed for nausea or vomiting. 30 tablet 0  . zolpidem (AMBIEN) 5 MG tablet Take 5 mg by mouth at bedtime.  1   Current Facility-Administered Medications on File Prior to Visit  Medication Dose Route Frequency Provider Last Rate Last Admin  . sodium chloride flush (NS) 0.9 % injection 10 mL  10 mL Intracatheter PRN Nicholas Lose, MD   10 mL at 07/19/16 1434    Allergies  Allergen Reactions  . Turmeric Diarrhea    Severe  diarrhea  . Decadron [Dexamethasone]     Exacerbates restless leg    Objective: There were no vitals filed for this visit.  General: No acute distress, AAOx3  Left foot: Sutures intact with no gapping or dehiscence at surgical site, mild swelling to left forefoot, no erythema, no warmth, no drainage, no signs of infection noted, Capillary fill time <3 seconds in all digits, gross sensation present via light touch to left foot. No pain or crepitation with range of motion left foot.  No pain with calf compression.   Post Op Xray, Left foot:Hardware intact. Soft tissue swelling within normal limits for post op status.   Assessment and  Plan:  Problem List Items Addressed This Visit   None   Visit Diagnoses    S/P foot surgery, left    -  Primary   Neuroma       Capsulitis       Scar tissue       Left foot pain       Bursitis of intermetatarsal bursa of left foot           -Patient seen and evaluated -Xrays reviewed  -Applied dry sterile dressing to surgical site left foot secured with coban wrap and stockinet  -Advised patient to make sure to keep dressings clean, dry, and intact to left surgical site -Advised patient to continue with post-op shoe on left foot  -Advised patient to limit activity to necessity  -Advised patient to ice and elevate as necessary  -Will plan for suture removal at next office visit and possibly wearing a normal shoe. In the meantime, patient to call office if any issues or problems arise.   Landis Martins, DPM

## 2021-02-15 ENCOUNTER — Ambulatory Visit: Payer: Medicare PPO | Admitting: Adult Health

## 2021-02-15 VITALS — Ht 64.0 in | Wt 220.0 lb

## 2021-02-15 DIAGNOSIS — G2581 Restless legs syndrome: Secondary | ICD-10-CM

## 2021-02-15 DIAGNOSIS — G4733 Obstructive sleep apnea (adult) (pediatric): Secondary | ICD-10-CM | POA: Diagnosis not present

## 2021-02-15 DIAGNOSIS — Z9989 Dependence on other enabling machines and devices: Secondary | ICD-10-CM | POA: Diagnosis not present

## 2021-02-15 MED ORDER — PRAMIPEXOLE DIHYDROCHLORIDE ER 0.375 MG PO TB24: 0.3750 mg | ORAL_TABLET | Freq: Every day | ORAL | 5 refills | Status: DC

## 2021-02-15 MED ORDER — PRAMIPEXOLE DIHYDROCHLORIDE 0.5 MG PO TABS: ORAL_TABLET | ORAL | 3 refills | Status: DC

## 2021-02-15 NOTE — Progress Notes (Signed)
PATIENT: Alexis Fox DOB: [birth year]  REASON FOR VISIT: follow up HISTORY FROM: patient  HISTORY OF PRESENT ILLNESS: Today 02/15/21:  Alexis Fox is a 65 year old female with a history of obstructive sleep apnea on CPAP and restless legs.  She returns today for follow-up.  Her download indicates that her CPAP is working well but she doesn't like using it. Srtates that she has to sleep supine but prefers to sleep on her side or stomach.  At the last visit we increased her pressure to 12 and her residual AHI has been reduced.  Her download is below.  Restless legs: She takes Mirapex 1 tablet in the morning and 1/2 tablet at bedtime. Reports that she is having more symptoms during the day. Reports that she will traveling for the next 1.5 years out [place]. States that when she is sitting for long periods she gets more symptoms in the legs and arms. She also will get nerve sensitivity down both legs starting mid buttocks. She has had multiple back surgeries in the past.     02/16/20: Alexis Fox is a 65 year old female with a history of obstructive sleep apnea on CPAP and restless leg syndrome.  She reports that Mirapex continues to work well for her.  This is currently being prescribed by her PCP.  Her CPAP download indicates that she use her machine 28 out of 30 days for compliance of 93%.  She used her machine greater than 4 hours 24 days for compliance of 80%.  On average she uses her machine 6 hours and 39 minutes.  Her residual AHI is 5.7 on 11 cm of water with EPR of 3.  Leak in the 95th percentile is 23.1 L/min.  She reports that the CPAP is working well for her.    HISTORY 06/18/19:  Alexis Fox is a 65 year old female with a history of obstructive sleep apnea on CPAP and restless leg syndrome.  She returns today for follow-up.  Her CPAP download indicates that she use her machine 30 out of 30 days for compliance of 100%.  She use her machine greater than 4 hours 28 days for  compliance of 93%.  On average she uses her machine 6 hours and 42 minutes.  Her residual AHI is 3.7 on 11 cm of water with EPR of 3.  Her leak in the 95th percentile is 31.3 L/min.  She currently has the nasal pillows but states that she would prefer the dream wear mask.  She states that there was some issue with her DME company that she cannot obtain this.  She states that her restless legs have been bothering her.  She states that she has not gotten much sleep in the last 2 nights.  She is currently taking Mirapex 0.5 mg at bedtime.  She is not taking half a tablet in the afternoon as instructed by Dr. Brett Fairy.  She states that she thought she was supposed to take Klonopin in the afternoons.  She returns today for follow-up.  REVIEW OF SYSTEMS: Out of a complete 14 system review of symptoms, the patient complains only of the following symptoms, and all other reviewed systems are negative.  See HPI  ALLERGIES: Allergies  Allergen Reactions  . Turmeric Diarrhea    Severe diarrhea  . Decadron [Dexamethasone]     Exacerbates restless leg    HOME MEDICATIONS: Outpatient Medications Prior to Visit  Medication Sig Dispense Refill  . buPROPion (WELLBUTRIN XL) 150 MG 24 hr tablet Take 150  mg by mouth daily.  4  . cetirizine (ZYRTEC) 10 MG tablet Take 10 mg by mouth at bedtime.     . Cholecalciferol (VITAMIN D3) 50 MCG (2000 UT) TABS Take 2,000 Units by mouth daily.     . citalopram (CELEXA) 40 MG tablet Take 40 mg by mouth at bedtime.   3  . clobetasol ointment (TEMOVATE) 2.75 % Apply 1 application topically 2 (two) times daily. 30 g 1  . clonazePAM (KLONOPIN) 1 MG tablet Take 1 mg by mouth at bedtime.     . cycloSPORINE (RESTASIS) 0.05 % ophthalmic emulsion Place 1 drop into both eyes 2 (two) times daily.    Marland Kitchen docusate sodium (COLACE) 100 MG capsule Take 100 mg by mouth 2 (two) times daily.    . Esomeprazole Magnesium 20 MG TBEC Take 20 mg by mouth daily before breakfast.  (Patient not taking:  Reported on 08/10/2020)    . ibuprofen (ADVIL) 800 MG tablet Take 1 tablet (800 mg total) by mouth every 8 (eight) hours as needed. 30 tablet 0  . Iron-Vitamin C (IRON 100/C PO) Take 1 tablet by mouth in the morning and at bedtime.     . pramipexole (MIRAPEX) 0.5 MG tablet Take 1/2 tab at 4 pm and 1 tab at night time prn. (Patient taking differently: Take 0.5 mg by mouth at bedtime.) 135 tablet 3  . promethazine (PHENERGAN) 12.5 MG tablet Take 1 tablet (12.5 mg total) by mouth every 6 (six) hours as needed for nausea or vomiting. 30 tablet 0  . zolpidem (AMBIEN) 5 MG tablet Take 5 mg by mouth at bedtime.  1   Facility-Administered Medications Prior to Visit  Medication Dose Route Frequency Provider Last Rate Last Admin  . sodium chloride flush (NS) 0.9 % injection 10 mL  10 mL Intracatheter PRN Nicholas Lose, MD   10 mL at 07/19/16 1434    PAST MEDICAL HISTORY: Past Medical History:  Diagnosis Date  . Anxiety   . Arthritis    knees  . Breast cancer ([place])   . Breast cancer of upper-outer quadrant of left female breast ([place]) 03/20/2016  . C2 cervical fracture ([place])    10/22/18, following fall from ladder  . Cervicalgia   . Cholecystitis   . Complication of anesthesia    BP "crashes" post op  . Depression   . Early cataracts, bilateral   . Family history of brain cancer   . Family history of breast cancer   . GERD (gastroesophageal reflux disease)   . Headache    " from my neck"  . Hot flashes   . Personal history of chemotherapy   . Personal history of radiation therapy   . Restless leg syndrome   . Sleep apnea 10/03/2018   wears CPAP    PAST SURGICAL HISTORY: Past Surgical History:  Procedure Laterality Date  . ANTERIOR CERVICAL DECOMP/DISCECTOMY FUSION N/A 01/22/2020   Procedure: ACDF - C2-C3;  Surgeon: Eustace Moore, MD;  Location: [facility];  Service: Neurosurgery;  Laterality: N/A;  . BACK SURGERY     x2  . BREAST BIOPSY    . BREAST LUMPECTOMY Left 2018  . BUNIONECTOMY     . CHOLECYSTECTOMY N/A 10/04/2015   Procedure: LAPAROSCOPIC CHOLECYSTECTOMY WITH INTRAOPERATIVE CHOLANGIOGRAM;  Surgeon: Greer Pickerel, MD;  Location: [facility];  Service: General;  Laterality: N/A;  . ELBOW SURGERY     right for epicondylitis 2010   . ESOPHAGEAL MANOMETRY N/A 11/30/2020   Procedure: ESOPHAGEAL MANOMETRY (EM);  Surgeon: Ronnette Juniper, MD;  Location: [facility];  Service: Gastroenterology;  Laterality: N/A;  . FOOT SURGERY     1983 -tarsal tunnel release  . IR GENERIC HISTORICAL  10/26/2016   IR CV LINE INJECTION 10/26/2016 [facility]-INTERV RAD  . LUMBAR DISC SURGERY  04/04/2012  . MASS EXCISION Left 02/06/2021   Procedure: EXCISION LEFT FOOT SOFT TISSUE BETWEEN SECOND AND THIRD AND THIRD AND FOURTH TOES;  Surgeon: Landis Martins, DPM;  Location: [facility];  Service: Podiatry;  Laterality: Left;  . PORT-A-CATH REMOVAL N/A 11/15/2016   Procedure: REMOVAL PORT-A-CATH;  Surgeon: Rolm Bookbinder, MD;  Location: [facility];  Service: General;  Laterality: N/A;  . PORTACATH PLACEMENT Right 04/02/2016   Procedure: INSERTION PORT-A-CATH WITH [place];  Surgeon: Rolm Bookbinder, MD;  Location: [facility];  Service: General;  Laterality: Right;  . RADIOACTIVE SEED GUIDED PARTIAL MASTECTOMY/AXILLARY SENTINEL NODE BIOPSY/AXILLARY NODE DISSECTION Left 09/12/2016   Procedure: LEFT BREAST SEED GUIDED LUMPECTOMY, LEFT AXILLARY SENTINEL NODE BIOPSY, LEFT SEED GUIDED AXILLARY NODE EXCISION( TARGETED AXILLARY DISSECTION), BLUE DYE INJECTION;  Surgeon: Rolm Bookbinder, MD;  Location: [facility];  Service: General;  Laterality: Left;  . SACROILIAC JOINT FUSION Left 05/18/2020   Procedure: LEFT SACROILIAC JOINT FUSION;  Surgeon: Phylliss Bob, MD;  Location: [facility];  Service: Orthopedics;  Laterality: Left;  . SPINAL FUSION  5/12   L5-S1  . SPINAL FUSION  05/18/2019   L4-5  . TARSAL TUNNEL RELEASE      FAMILY HISTORY: Family History  Problem Relation  Age of Onset  . Heart disease Mother   . Pulmonary fibrosis Mother   . Hypertension Mother   . Cancer Father 75       astocytoma  . Hypertension Brother   . Lymphoma Maternal Aunt   . Anesthesia problems Neg Hx     SOCIAL HISTORY: Social History   Socioeconomic History  . Marital status: Married    Spouse name: Not on file  . Number of children: 2  . Years of education: Not on file  . Highest education level: Not on file  Occupational History  . Not on file  Tobacco Use  . Smoking status: Never Smoker  . Smokeless tobacco: Never Used  Vaping Use  . Vaping Use: Never used  Substance and Sexual Activity  . Alcohol use: Yes    Alcohol/week: 1.0 standard drink    Types: 1 Cans of beer per week    Comment: occasional  . Drug use: No  . Sexual activity: Not Currently    Birth control/protection: Post-menopausal  Other Topics Concern  . Not on file  Social History Narrative  . Not on file   Social Determinants of Health   Financial Resource Strain: Not on file  Food Insecurity: Not on file  Transportation Needs: Not on file  Physical Activity: Not on file  Stress: Not on file  Social Connections: Not on file  Intimate Partner Violence: Not on file      PHYSICAL EXAM  Vitals:   02/15/21 1121  Weight: 220 lb (99.8 kg)  Height: 5\' 4"  (1.626 m)   Body mass index is 37.76 kg/m.  Generalized: Well developed, in no acute distress  Chest: Lungs clear to auscultation bilaterally  Neurological examination  Mentation: Alert oriented to time, place, history taking. Follows all commands speech and language fluent Cranial nerve II-XII: Extraocular movements were full, visual field were full on confrontational test Head turning and shoulder shrug  were normal and symmetric. Motor: The motor testing reveals 5 over 5 strength of all 4 extremities. Good symmetric motor tone is noted throughout.  Sensory: Sensory testing is intact to soft touch on all 4 extremities. No  evidence of extinction is noted.  Gait and station: Gait is normal.    DIAGNOSTIC DATA (LABS, IMAGING, TESTING) - I reviewed patient records, labs, notes, testing and imaging myself where available.  Lab Results  Component Value Date   WBC 6.2 05/10/2020   HGB 12.6 05/10/2020   HCT 40.2 05/10/2020   MCV 95.9 05/10/2020   PLT 228 05/10/2020      Component Value Date/Time   NA 146 (H) 05/10/2020 1344   NA 139 05/27/2017 1126   K 4.0 05/10/2020 1344   K 4.0 05/27/2017 1126   CL 108 05/10/2020 1344   CO2 26 05/10/2020 1344   CO2 28 05/27/2017 1126   GLUCOSE 91 05/10/2020 1344   GLUCOSE 92 05/27/2017 1126   BUN 20 05/10/2020 1344   BUN 17.9 05/27/2017 1126   CREATININE 0.83 05/10/2020 1344   CREATININE 1.0 05/27/2017 1126   CALCIUM 9.5 05/10/2020 1344   CALCIUM 10.0 05/27/2017 1126   PROT 6.7 05/10/2020 1344   PROT 6.9 09/15/2018 1310   PROT 7.4 05/27/2017 1126   ALBUMIN 3.6 05/10/2020 1344   ALBUMIN 3.9 05/27/2017 1126   AST 22 05/10/2020 1344   AST 28 05/27/2017 1126   ALT 20 05/10/2020 1344   ALT 52 05/27/2017 1126   ALKPHOS 68 05/10/2020 1344   ALKPHOS 69 05/27/2017 1126   BILITOT 0.5 05/10/2020 1344   BILITOT 0.85 05/27/2017 1126   GFRNONAA >60 05/10/2020 1344   GFRAA >60 05/10/2020 1344    Lab Results  Component Value Date   TSH 2.680 09/15/2018      ASSESSMENT AND PLAN 65 y.o. year old female  has a past medical history of Anxiety, Arthritis, Breast cancer ([place]), Breast cancer of upper-outer quadrant of left female breast ([place]) (03/20/2016), C2 cervical fracture ([place]), Cervicalgia, Cholecystitis, Complication of anesthesia, Depression, Early cataracts, bilateral, Family history of brain cancer, Family history of breast cancer, GERD (gastroesophageal reflux disease), Headache, Hot flashes, Personal history of chemotherapy, Personal history of radiation therapy, Restless leg syndrome, and Sleep apnea (10/03/2018). here with:  1. OSA on CPAP  - CPAP  compliance excellent -Residual AHI is in normal range  - Discuss Inspire device  -Encouraged patient to continue using CPAP nightly and greater than 4 hours each night  2.  Restless leg syndrome   -Start Mirapex ER 0.375 mg daily  - Ok to use Mirapex immediate release 0.5 mg 1/2 tablet BID for breakthrough symptoms - F/U in 1 year or sooner if needed   I spent 25 minutes of face-to-face and non-face-to-face time with patient.  This included previsit chart review, lab review, study review, order entry, electronic health record documentation, patient education.  Ward Givens, MSN, NP-C 02/15/2021, 11:10 AM [facility] [address], [place], [place] [address] [phone]

## 2021-02-15 NOTE — Patient Instructions (Signed)
Your Plan:  Start Mirapex ER 0.375 mg daily  Take Mirapex immediate release 1/2 tablet twice a day if needed  If your symptoms worsen or you develop new symptoms please let us know.    Thank you for coming to see [place] at [facility]. I hope we have been able to provide you high quality care today.  You may receive a patient satisfaction survey over the next few weeks. We would appreciate your feedback and comments so that we may continue to improve ourselves and the health of our patients.

## 2021-02-20 ENCOUNTER — Telehealth: Payer: Self-pay | Admitting: Adult Health

## 2021-02-20 MED ORDER — PRAMIPEXOLE DIHYDROCHLORIDE 0.5 MG PO TABS: ORAL_TABLET | ORAL | 3 refills | Status: DC

## 2021-02-20 NOTE — Telephone Encounter (Signed)
Pt request refill for pramipexole (MIRAPEX) 0.5 MG tablet sent to [facility].

## 2021-02-20 NOTE — Telephone Encounter (Signed)
Refill has been sent per pt's request.  ?

## 2021-02-21 ENCOUNTER — Ambulatory Visit (INDEPENDENT_AMBULATORY_CARE_PROVIDER_SITE_OTHER): Payer: Medicare PPO | Admitting: Sports Medicine

## 2021-02-21 ENCOUNTER — Encounter: Payer: Self-pay | Admitting: Sports Medicine

## 2021-02-21 ENCOUNTER — Other Ambulatory Visit: Payer: Self-pay

## 2021-02-21 DIAGNOSIS — G4700 Insomnia, unspecified: Secondary | ICD-10-CM | POA: Insufficient documentation

## 2021-02-21 DIAGNOSIS — F325 Major depressive disorder, single episode, in full remission: Secondary | ICD-10-CM | POA: Insufficient documentation

## 2021-02-21 DIAGNOSIS — R1310 Dysphagia, unspecified: Secondary | ICD-10-CM | POA: Insufficient documentation

## 2021-02-21 DIAGNOSIS — F419 Anxiety disorder, unspecified: Secondary | ICD-10-CM | POA: Insufficient documentation

## 2021-02-21 DIAGNOSIS — K219 Gastro-esophageal reflux disease without esophagitis: Secondary | ICD-10-CM | POA: Insufficient documentation

## 2021-02-21 DIAGNOSIS — E559 Vitamin D deficiency, unspecified: Secondary | ICD-10-CM | POA: Insufficient documentation

## 2021-02-21 DIAGNOSIS — L255 Unspecified contact dermatitis due to plants, except food: Secondary | ICD-10-CM | POA: Insufficient documentation

## 2021-02-21 DIAGNOSIS — J309 Allergic rhinitis, unspecified: Secondary | ICD-10-CM | POA: Insufficient documentation

## 2021-02-21 DIAGNOSIS — K3184 Gastroparesis: Secondary | ICD-10-CM | POA: Insufficient documentation

## 2021-02-21 DIAGNOSIS — M79672 Pain in left foot: Secondary | ICD-10-CM

## 2021-02-21 DIAGNOSIS — L905 Scar conditions and fibrosis of skin: Secondary | ICD-10-CM

## 2021-02-21 DIAGNOSIS — Z9889 Other specified postprocedural states: Secondary | ICD-10-CM

## 2021-02-21 DIAGNOSIS — D3610 Benign neoplasm of peripheral nerves and autonomic nervous system, unspecified: Secondary | ICD-10-CM

## 2021-02-21 DIAGNOSIS — Z853 Personal history of malignant neoplasm of breast: Secondary | ICD-10-CM | POA: Insufficient documentation

## 2021-02-21 DIAGNOSIS — G5762 Lesion of plantar nerve, left lower limb: Secondary | ICD-10-CM | POA: Insufficient documentation

## 2021-02-21 DIAGNOSIS — M7752 Other enthesopathy of left foot: Secondary | ICD-10-CM

## 2021-02-21 DIAGNOSIS — Z171 Estrogen receptor negative status [ER-]: Secondary | ICD-10-CM | POA: Insufficient documentation

## 2021-02-21 DIAGNOSIS — M779 Enthesopathy, unspecified: Secondary | ICD-10-CM

## 2021-02-21 MED ORDER — PRAMIPEXOLE DIHYDROCHLORIDE ER 0.375 MG PO TB24: 0.3750 mg | ORAL_TABLET | Freq: Every day | ORAL | 1 refills | Status: DC

## 2021-02-21 NOTE — Addendum Note (Signed)
Addended by: Verlin Grills on: 02/21/2021 09:25 AM   Modules accepted: Orders

## 2021-02-21 NOTE — Telephone Encounter (Signed)
Pt called stating that the wrong dosage was called in to her [place]. Pt states it was to be .375 mg not 0.5mg  Please advise.

## 2021-02-21 NOTE — Telephone Encounter (Signed)
Refill has been sent.  °

## 2021-02-21 NOTE — Progress Notes (Signed)
Subjective: Alexis Fox is a 65 y.o. female patient seen today in office for POV #2 (DOS 02/06/21), S/P Left foot removal of inflammed nerves at left 2 and 3rd webspaces. Patient admits some pain to her ball, sharp pains, cant walk barefoot yet, denies calf pain, denies headache, chest pain, shortness of breath, nausea, vomiting, fever, or chills. No other issues noted.   Patient Active Problem List   Diagnosis Date Noted  . Allergic rhinitis 02/21/2021  . Anxiety 02/21/2021  . Contact dermatitis due to plant 02/21/2021  . Dysphagia 02/21/2021  . Estrogen receptor negative status (ER-) 02/21/2021  . Gastroesophageal reflux disease 02/21/2021  . Gastroparesis 02/21/2021  . Insomnia 02/21/2021  . 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 Morton's neuroma of left foot 02/21/2021  . Personal history of malignant neoplasm of breast 02/21/2021  . Vitamin D deficiency 02/21/2021  . Oth nondisplaced dens fracture, init for clos fx ([place]) 02/04/2020  . S/P cervical spinal fusion 01/22/2020  . Memory deficit 01/14/2020  . Sacroiliac dysfunction 01/14/2020  . Neck pain 11/05/2019  . Body mass index (BMI) 25.0-25.9, adult 11/05/2019  . Unilateral primary osteoarthritis, right hip 09/01/2019  . Trochanteric bursitis, left hip 07/14/2019  . S/P lumbar fusion 05/18/2019  . OSA (obstructive sleep apnea) 10/28/2018  . Pain managed using patient-controlled analgesia (PCA) 10/25/2018  . Fall 10/24/2018  . Fall from ladder 10/23/2018  . DDD (degenerative disc disease), lumbosacral 09/15/2018  . 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 Central line complication 52/84/1324  . Genetic testing 05/15/2016  . Family history of breast cancer   . Family history of brain cancer   . Breast cancer of upper-outer quadrant of left female breast ([place]) 03/20/2016  . Transaminitis 10/03/2015  . Symptomatic cholelithiasis 10/03/2015  . RUQ abdominal pain   . Lumbosacral neuritis  11/10/2014  . Lumbar radiculopathy 10/19/2014  . Depression 06/24/2013  . RLS (restless legs syndrome) 06/24/2013  . Low back pain 05/19/2013    Current Outpatient Medications on File Prior to Visit  Medication Sig Dispense Refill  . fluticasone (FLONASE ALLERGY RELIEF) 50 MCG/ACT nasal spray one puff    . ipratropium (ATROVENT) 0.06 % nasal spray 2 sprays in each nostril as needed    . Ascorbic Acid (VITAMIN C) 500 MG CAPS 1 tablet    . buPROPion (WELLBUTRIN XL) 150 MG 24 hr tablet Take 150 mg by mouth daily.  4  . cetirizine (ZYRTEC) 10 MG tablet Take 10 mg by mouth at bedtime.     . Cholecalciferol (VITAMIN D3) 50 MCG (2000 UT) TABS Take 2,000 Units by mouth daily.     . citalopram (CELEXA) 40 MG tablet Take 40 mg by mouth at bedtime.   3  . clobetasol ointment (TEMOVATE) 4.01 % Apply 1 application topically 2 (two) times daily. 30 g 1  . clonazePAM (KLONOPIN) 1 MG tablet Take 1 mg by mouth at bedtime.     . cycloSPORINE (RESTASIS) 0.05 % ophthalmic emulsion Place 1 drop into both eyes 2 (two) times daily.    Marland Kitchen docusate sodium (COLACE) 100 MG capsule Take 100 mg by mouth 2 (two) times daily.    Marland Kitchen esomeprazole (NEXIUM) 40 MG capsule Take 1 capsule by mouth 2 (two) times daily.    . Ferrous Sulfate (IRON) 325 (65 Fe) MG TABS 1 tablet    . ibuprofen (ADVIL) 800 MG tablet Take 1 tablet (800 mg total) by mouth every 8 (eight) hours as needed. 30 tablet 0  .  Iron-Vitamin C (IRON 100/C PO) Take 1 tablet by mouth in the morning and at bedtime.     Marland Kitchen nystatin-triamcinolone ointment (MYCOLOG) 1 application    . pramipexole (MIRAPEX) 0.5 MG tablet Take 1/2 tab BID PRN if break through symptoms 90 tablet 3  . Pramipexole Dihydrochloride 0.375 MG TB24 Take 1 tablet (0.375 mg total) by mouth daily. 90 tablet 1  . zolpidem (AMBIEN) 5 MG tablet Take 5 mg by mouth at bedtime.  1   Current Facility-Administered Medications on File Prior to Visit  Medication Dose Route Frequency Provider Last Rate Last  Admin  . sodium chloride flush (NS) 0.9 % injection 10 mL  10 mL Intracatheter PRN Nicholas Lose, MD   10 mL at 07/19/16 1434    Allergies  Allergen Reactions  . Turmeric Diarrhea    Severe diarrhea  . Decadron [Dexamethasone]     Exacerbates restless leg    Objective: There were no vitals filed for this visit.  General: No acute distress, AAOx3  Left foot: Sutures intact with no gapping or dehiscence at surgical site, mild swelling to left forefoot, no erythema, no warmth, no drainage, no signs of infection noted, Capillary fill time <3 seconds in all digits, gross sensation present via light touch to left foot. No pain or crepitation with range of motion left foot. Subjective sharps pain. No pain with calf compression.    Assessment and Plan:  Problem List Items Addressed This Visit   None   Visit Diagnoses    S/P foot surgery, left    -  Primary   Neuroma       Capsulitis       Scar tissue       Left foot pain       Bursitis of intermetatarsal bursa of left foot           -Patient seen and evaluated -Sutures removed -Advised patient may shower tomorrow and allow steristrips to fall off  -Advised patient to limit activity to necessity and may try a normal shoe as tolerated gave her a smaller used post op shoe to wear until she can try a normal shoe -Advised patient to ice and elevate as necessary  -Will plan for increasing activities at next office visit. In the meantime, patient to call office if any issues or problems arise.   Landis Martins, DPM

## 2021-02-23 ENCOUNTER — Telehealth: Payer: Self-pay | Admitting: Neurology

## 2021-02-23 ENCOUNTER — Encounter: Payer: Self-pay | Admitting: Neurology

## 2021-02-23 NOTE — Telephone Encounter (Signed)
The completed through cover my meds/[employer] for generic Mirapex ER. SRP:[id number] (pt has tried Mirapex IR) Will await response from insurance.

## 2021-02-23 NOTE — Telephone Encounter (Signed)
Completed appeal via the phone.  It can take 24 hours for response. Case # [id number]

## 2021-02-23 NOTE — Telephone Encounter (Signed)
Called the patient and advised that the medication was denied. Pt is aware of the denial. Was denied because the patient had not tried and failed requip.  Pt states prior to coming here as a patient back in 2014 she was on requip. Once it became ineffective the pcp referred here and Dr Dohmeier then started her on the mirapex. Pt has been stable until recently at the visit it was discussed for the pt to start the ER form of the medication. The IR form will still be continued as break through.  Informed the patient I will complete the appeals process an that it can take up to 30 days before a response. Pt states that pharmacist she spoke with through [place] was [place] and provided a phone number to call to complete expedited  Appeal. The number she provided was [phone]. Informed the pt I will attempt that way

## 2021-02-23 NOTE — Telephone Encounter (Signed)
Pt called, having continuing difficulty getting prescription for pramipexole (MIRAPEX) 0.5 MG tablet. Would like a call from the nurse.

## 2021-02-27 ENCOUNTER — Encounter: Payer: Self-pay | Admitting: Neurology

## 2021-02-27 NOTE — Telephone Encounter (Signed)
[employer] has approved the medication for the patient until 12/02/2021.

## 2021-02-28 DIAGNOSIS — G4733 Obstructive sleep apnea (adult) (pediatric): Secondary | ICD-10-CM | POA: Diagnosis not present

## 2021-03-01 ENCOUNTER — Ambulatory Visit (INDEPENDENT_AMBULATORY_CARE_PROVIDER_SITE_OTHER): Payer: Medicare PPO

## 2021-03-01 ENCOUNTER — Other Ambulatory Visit: Payer: Self-pay

## 2021-03-01 ENCOUNTER — Encounter: Payer: Self-pay | Admitting: Orthopaedic Surgery

## 2021-03-01 ENCOUNTER — Ambulatory Visit: Payer: Medicare PPO | Admitting: Orthopaedic Surgery

## 2021-03-01 VITALS — Ht 64.0 in | Wt 215.0 lb

## 2021-03-01 DIAGNOSIS — G8929 Other chronic pain: Secondary | ICD-10-CM

## 2021-03-01 DIAGNOSIS — M25512 Pain in left shoulder: Secondary | ICD-10-CM | POA: Insufficient documentation

## 2021-03-01 MED ORDER — LIDOCAINE HCL 2 % IJ SOLN
2.0000 mL | INTRAMUSCULAR | Status: AC | PRN
  Administered 2021-03-01: 2 mL

## 2021-03-01 MED ORDER — BUPIVACAINE HCL 0.25 % IJ SOLN
2.0000 mL | INTRAMUSCULAR | Status: AC | PRN
  Administered 2021-03-01: 2 mL via INTRA_ARTICULAR

## 2021-03-01 NOTE — Progress Notes (Signed)
Office Visit Note   Patient: Alexis Fox           Date of Birth: 05/08/1956           MRN: [id number] Visit Date: 03/01/2021              Requested by: [name], MD [address] [place],  [place] [address] PCP: [name], MD   Assessment & Plan: Visit Diagnoses:  1. Chronic left shoulder pain     Plan: Insidious onset of left shoulder pain approximately 2 months ago.  Timmy and her husband are planning a 27-month trip across the country and she has been doing a lot of moving and lifting.  The pain wakes her at night and has more pain with overhead activity.  Films were negative.  Certainly could have a small rotator cuff tear as she has good strength and full overhead motion.  Certainly could have just an impingement syndrome with rotator cuff tendinitis.  She is leaving in the next 2 weeks.  Will inject the subacromial space with betamethasone and hope that it makes a big difference.  We talked about Voltaren gel and over-the-counter medicines  Follow-Up Instructions: Return if symptoms worsen or fail to improve.   Orders:  Orders Placed This Encounter  Procedures  . Large Joint Inj: L subacromial bursa  . XR Shoulder Left   No orders of the defined types were placed in this encounter.     Procedures: Large Joint Inj: L subacromial bursa on 03/01/2021 3:28 PM Indications: pain and diagnostic evaluation Details: 25 G 1.5 in needle, anterolateral approach  Arthrogram: No  Medications: 2 mL lidocaine 2 %; 2 mL bupivacaine 0.25 %  12 mg betamethasone injected into the subacromial space left shoulder with Xylocaine and Marcaine Consent was given by the patient. Immediately prior to procedure a time out was called to verify the correct patient, procedure, equipment, support staff and site/side marked as required. Patient was prepped and draped in the usual sterile fashion.       Clinical Data: No additional findings.   Subjective: Chief  Complaint  Patient presents with  . Left Shoulder - Pain  Patient presents today for left shoulder pain. She said that it came on gradually about two months ago. No injury. She said that it hurts all throughout her shoulder. Decreased range of motion. No numbness, tingling, or weakness. She said that her pain is worse at night and wakes her. Using that arm causes pain. She takes Tylenol for pain relief. She is right hand dominant.   HPI  Review of Systems   Objective: Vital Signs: Ht 5\' 4"  (1.626 m)   Wt 215 lb (97.5 kg)   BMI 36.90 kg/m   Physical Exam Constitutional:      Appearance: She is well-developed.  Eyes:     Pupils: Pupils are equal, round, and reactive to light.  Pulmonary:     Effort: Pulmonary effort is normal.  Skin:    General: Skin is warm and dry.  Neurological:     Mental Status: She is alert and oriented to person, place, and time.  Psychiatric:        Behavior: Behavior normal.     Ortho Exam left shoulder with full overhead motion but with a circuitous arc of motion.  Mild impingement testing on extreme of external rotation.  Mild empty can testing.  No popping or clicking.  Biceps intact.  Good grip and release.  Good strength.  Does have some discomfort along the anterior subacromial region and not at the [facility] joint.  Speeds sign was negative Specialty Comments:  No specialty comments available.  Imaging: XR Shoulder Left  Result Date: 03/01/2021 Films of the left shoulder obtained in several projections.  No obvious acute changes.  Humeral head appears to be centered about the glenoid.  Normal space between the humeral head and the acromium.  No ectopic calcification.  No obvious degenerative change at the glenohumeral joint.  There are some degenerative changes at the [facility] joint    PMFS History: Patient Active Problem List   Diagnosis Date Noted  . Pain in left shoulder 03/01/2021  . Allergic rhinitis 02/21/2021  . Anxiety 02/21/2021  . Contact  dermatitis due to plant 02/21/2021  . Dysphagia 02/21/2021  . Estrogen receptor negative status (ER-) 02/21/2021  . Gastroesophageal reflux disease 02/21/2021  . Gastroparesis 02/21/2021  . Insomnia 02/21/2021  . 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[place]) 02/21/2021  . Morton's neuroma of left foot 02/21/2021  . Personal history of malignant neoplasm of breast 02/21/2021  . Vitamin D deficiency 02/21/2021  . Oth nondisplaced dens fracture, init for clos fx ([place]) 02/04/2020  . S/P cervical spinal fusion 01/22/2020  . Memory deficit 01/14/2020  . Sacroiliac dysfunction 01/14/2020  . Neck pain 11/05/2019  . Body mass index (BMI) 25.0-25.9, adult 11/05/2019  . Unilateral primary osteoarthritis, right hip 09/01/2019  . Trochanteric bursitis, left hip 07/14/2019  . S/P lumbar fusion 05/18/2019  . OSA (obstructive sleep apnea) 10/28/2018  . Pain managed using patient-controlled analgesia (PCA) 10/25/2018  . Fall 10/24/2018  . Fall from ladder 10/23/2018  . DDD (degenerative disc disease), lumbosacral 09/15/2018  . Chemotherapy-induced neuropathy ([place]) 09/15/2018  . Central line complication 35/36/1443  . Genetic testing 05/15/2016  . Family history of breast cancer   . Family history of brain cancer   . Breast cancer of upper-outer quadrant of left female breast ([place]) 03/20/2016  . Transaminitis 10/03/2015  . Symptomatic cholelithiasis 10/03/2015  . RUQ abdominal pain   . Lumbosacral neuritis 11/10/2014  . Lumbar radiculopathy 10/19/2014  . Depression 06/24/2013  . RLS (restless legs syndrome) 06/24/2013  . Low back pain 05/19/2013   Past Medical History:  Diagnosis Date  . Anxiety   . Arthritis    knees  . Breast cancer ([place])   . Breast cancer of upper-outer quadrant of left female breast ([place]) 03/20/2016  . C2 cervical fracture ([place])    10/22/18, following fall from ladder  . Cervicalgia   . Cholecystitis   . Complication of anesthesia    BP "crashes" post op  .  Depression   . Early cataracts, bilateral   . Family history of brain cancer   . Family history of breast cancer   . GERD (gastroesophageal reflux disease)   . Headache    " from my neck"  . Hot flashes   . Personal history of chemotherapy   . Personal history of radiation therapy   . Restless leg syndrome   . Sleep apnea 10/03/2018   wears CPAP    Family History  Problem Relation Age of Onset  . Heart disease Mother   . Pulmonary fibrosis Mother   . Hypertension Mother   . Cancer Father 48       astocytoma  . Hypertension Brother   . Lymphoma Maternal Aunt   . Anesthesia problems Neg Hx     Past Surgical History:  Procedure Laterality Date  .  ANTERIOR CERVICAL DECOMP/DISCECTOMY FUSION N/A 01/22/2020   Procedure: ACDF - C2-C3;  Surgeon: Eustace Moore, MD;  Location: [place];  Service: Neurosurgery;  Laterality: N/A;  . BACK SURGERY     x2  . BREAST BIOPSY    . BREAST LUMPECTOMY Left 2018  . BUNIONECTOMY    . CHOLECYSTECTOMY N/A 10/04/2015   Procedure: LAPAROSCOPIC CHOLECYSTECTOMY WITH INTRAOPERATIVE CHOLANGIOGRAM;  Surgeon: Greer Pickerel, MD;  Location: [place];  Service: General;  Laterality: N/A;  . ELBOW SURGERY     right for epicondylitis 2010   . ESOPHAGEAL MANOMETRY N/A 11/30/2020   Procedure: ESOPHAGEAL MANOMETRY (EM);  Surgeon: Ronnette Juniper, MD;  Location: [facility];  Service: Gastroenterology;  Laterality: N/A;  . FOOT SURGERY     1983 -tarsal tunnel release  . IR GENERIC HISTORICAL  10/26/2016   IR CV LINE INJECTION 10/26/2016 [facility]  . LUMBAR DISC SURGERY  04/04/2012  . MASS EXCISION Left 02/06/2021   Procedure: EXCISION LEFT FOOT SOFT TISSUE BETWEEN SECOND AND THIRD AND THIRD AND FOURTH TOES;  Surgeon: Landis Martins, DPM;  Location: [place];  Service: Podiatry;  Laterality: Left;  . PORT-A-CATH REMOVAL N/A 11/15/2016   Procedure: REMOVAL PORT-A-CATH;  Surgeon: Rolm Bookbinder, MD;  Location: [place];  Service: General;   Laterality: N/A;  . PORTACATH PLACEMENT Right 04/02/2016   Procedure: INSERTION PORT-A-CATH WITH [place];  Surgeon: Rolm Bookbinder, MD;  Location: [place];  Service: General;  Laterality: Right;  . RADIOACTIVE SEED GUIDED PARTIAL MASTECTOMY/AXILLARY SENTINEL NODE BIOPSY/AXILLARY NODE DISSECTION Left 09/12/2016   Procedure: LEFT BREAST SEED GUIDED LUMPECTOMY, LEFT AXILLARY SENTINEL NODE BIOPSY, LEFT SEED GUIDED AXILLARY NODE EXCISION( TARGETED AXILLARY DISSECTION), BLUE DYE INJECTION;  Surgeon: Rolm Bookbinder, MD;  Location: [place];  Service: General;  Laterality: Left;  . SACROILIAC JOINT FUSION Left 05/18/2020   Procedure: LEFT SACROILIAC JOINT FUSION;  Surgeon: Phylliss Bob, MD;  Location: [place];  Service: Orthopedics;  Laterality: Left;  . SPINAL FUSION  5/12   L5-S1  . SPINAL FUSION  05/18/2019   L4-5  . TARSAL TUNNEL RELEASE     Social History   Occupational History  . Not on file  Tobacco Use  . Smoking status: Never Smoker  . Smokeless tobacco: Never Used  Vaping Use  . Vaping Use: Never used  Substance and Sexual Activity  . Alcohol use: Yes    Alcohol/week: 1.0 standard drink    Types: 1 Cans of beer per week    Comment: occasional  . Drug use: No  . Sexual activity: Not Currently    Birth control/protection: Post-menopausal

## 2021-03-06 ENCOUNTER — Encounter: Payer: Self-pay | Admitting: Adult Health

## 2021-03-06 ENCOUNTER — Other Ambulatory Visit: Payer: Self-pay | Admitting: Neurology

## 2021-03-06 MED ORDER — PRAMIPEXOLE DIHYDROCHLORIDE ER 0.75 MG PO TB24: 0.7500 mg | ORAL_TABLET | Freq: Every day | ORAL | 0 refills | Status: DC

## 2021-03-06 NOTE — Addendum Note (Signed)
Addended by: Darleen Crocker on: 03/06/2021 04:22 PM   Modules accepted: Orders

## 2021-03-07 ENCOUNTER — Other Ambulatory Visit: Payer: Self-pay

## 2021-03-07 ENCOUNTER — Ambulatory Visit (INDEPENDENT_AMBULATORY_CARE_PROVIDER_SITE_OTHER): Payer: Medicare PPO | Admitting: Sports Medicine

## 2021-03-07 DIAGNOSIS — L905 Scar conditions and fibrosis of skin: Secondary | ICD-10-CM

## 2021-03-07 DIAGNOSIS — M79672 Pain in left foot: Secondary | ICD-10-CM

## 2021-03-07 DIAGNOSIS — M779 Enthesopathy, unspecified: Secondary | ICD-10-CM

## 2021-03-07 DIAGNOSIS — Z9889 Other specified postprocedural states: Secondary | ICD-10-CM

## 2021-03-07 DIAGNOSIS — D3610 Benign neoplasm of peripheral nerves and autonomic nervous system, unspecified: Secondary | ICD-10-CM

## 2021-03-07 NOTE — Progress Notes (Signed)
Subjective: Alexis Fox is a 65 y.o. female patient seen today in office for POV # 3 (DOS 02/06/21), S/P Left foot removal of inflammed nerves at left 2 and 3rd webspaces. Patient admits some pain to her ball, that is slowly getting better back in a regular shoe discomfort 3 out of 10. No other issues noted.   Patient Active Problem List   Diagnosis Date Noted  . Pain in left shoulder 03/01/2021  . Allergic rhinitis 02/21/2021  . Anxiety 02/21/2021  . Contact dermatitis due to plant 02/21/2021  . Dysphagia 02/21/2021  . Estrogen receptor negative status (ER-) 02/21/2021  . Gastroesophageal reflux disease 02/21/2021  . Gastroparesis 02/21/2021  . Insomnia 02/21/2021  . 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[place]) 02/21/2021  . Morton's neuroma of left foot 02/21/2021  . Personal history of malignant neoplasm of breast 02/21/2021  . Vitamin D deficiency 02/21/2021  . Oth nondisplaced dens fracture, init for clos fx ([place]) 02/04/2020  . S/P cervical spinal fusion 01/22/2020  . Memory deficit 01/14/2020  . Sacroiliac dysfunction 01/14/2020  . Neck pain 11/05/2019  . Body mass index (BMI) 25.0-25.9, adult 11/05/2019  . Unilateral primary osteoarthritis, right hip 09/01/2019  . Trochanteric bursitis, left hip 07/14/2019  . S/P lumbar fusion 05/18/2019  . OSA (obstructive sleep apnea) 10/28/2018  . Pain managed using patient-controlled analgesia (PCA) 10/25/2018  . Fall 10/24/2018  . Fall from ladder 10/23/2018  . DDD (degenerative disc disease), lumbosacral 09/15/2018  . Chemotherapy-induced neuropathy ([place]) 09/15/2018  . Central line complication 40/98/1191  . Genetic testing 05/15/2016  . Family history of breast cancer   . Family history of brain cancer   . Breast cancer of upper-outer quadrant of left female breast ([place]) 03/20/2016  . Transaminitis 10/03/2015  . Symptomatic cholelithiasis 10/03/2015  . RUQ abdominal pain   . Lumbosacral neuritis 11/10/2014  . Lumbar  radiculopathy 10/19/2014  . Depression 06/24/2013  . RLS (restless legs syndrome) 06/24/2013  . Low back pain 05/19/2013    Current Outpatient Medications on File Prior to Visit  Medication Sig Dispense Refill  . Ascorbic Acid (VITAMIN C) 500 MG CAPS 1 tablet    . buPROPion (WELLBUTRIN XL) 150 MG 24 hr tablet Take 150 mg by mouth daily.  4  . cetirizine (ZYRTEC) 10 MG tablet Take 10 mg by mouth at bedtime.     . Cholecalciferol (VITAMIN D3) 50 MCG (2000 UT) TABS Take 2,000 Units by mouth daily.     . citalopram (CELEXA) 40 MG tablet Take 40 mg by mouth at bedtime.   3  . clobetasol ointment (TEMOVATE) 4.78 % Apply 1 application topically 2 (two) times daily. 30 g 1  . clonazePAM (KLONOPIN) 1 MG tablet Take 1 mg by mouth at bedtime.     . cycloSPORINE (RESTASIS) 0.05 % ophthalmic emulsion Place 1 drop into both eyes 2 (two) times daily.    Marland Kitchen docusate sodium (COLACE) 100 MG capsule Take 100 mg by mouth 2 (two) times daily.    Marland Kitchen esomeprazole (NEXIUM) 40 MG capsule Take 1 capsule by mouth 2 (two) times daily.    . Ferrous Sulfate (IRON) 325 (65 Fe) MG TABS 1 tablet    . fluticasone (FLONASE) 50 MCG/ACT nasal spray one puff    . ipratropium (ATROVENT) 0.06 % nasal spray 2 sprays in each nostril as needed    . Iron-Vitamin C (IRON 100/C PO) Take 1 tablet by mouth in the morning and at bedtime.     . Pramipexole Dihydrochloride (  MIRAPEX ER) 0.75 MG TB24 Take 1 tablet (0.75 mg total) by mouth daily. 90 tablet 0   Current Facility-Administered Medications on File Prior to Visit  Medication Dose Route Frequency Provider Last Rate Last Admin  . sodium chloride flush (NS) 0.9 % injection 10 mL  10 mL Intracatheter PRN Nicholas Lose, MD   10 mL at 07/19/16 1434    Allergies  Allergen Reactions  . Turmeric Diarrhea    Severe diarrhea  . Decadron [Dexamethasone]     Exacerbates restless leg    Objective: There were no vitals filed for this visit.  General: No acute distress, AAOx3  Left  foot: Surgical site healing well, dry scabbing noted, blanchable erythema, no warmth, no drainage, no signs of infection noted, Capillary fill time <3 seconds in all digits, gross sensation present via light touch to left foot. No pain or crepitation with range of motion left foot. Subjective sharps pain and abnormal sensation to the ball of the foot. No pain with calf compression.    Assessment and Plan:  Problem List Items Addressed This Visit   None   Visit Diagnoses    S/P foot surgery, left    -  Primary   Neuroma       Capsulitis       Scar tissue       Left foot pain           -Patient seen and evaluated -May continue with a normal shoe as tolerated -Advised patient to ice and elevate as necessary  -May slowly increase activities to tolerance -Advised patient since she is going out of town for 18 months to call office if there is any problems for a virtual visit or to be referred to a local provider wherever she is on her road trip.  In the meantime, patient to call office if any issues or problems arise.   Landis Martins, DPM

## 2021-03-15 ENCOUNTER — Encounter: Payer: Self-pay | Admitting: Adult Health

## 2021-03-20 ENCOUNTER — Encounter: Payer: Self-pay | Admitting: Sports Medicine

## 2021-03-27 ENCOUNTER — Telehealth: Payer: Self-pay

## 2021-03-27 NOTE — Telephone Encounter (Signed)
Contacted patient back relaying Dr. Leeanne Rio instructions. Patient states she removed a total of 3 stitches and has been cleaning it and applying triple abx cream. Pt states it looks just fine and no signs of infections

## 2021-04-03 MED ORDER — PRAMIPEXOLE DIHYDROCHLORIDE 0.5 MG PO TABS
0.5000 mg | ORAL_TABLET | Freq: Every day | ORAL | 3 refills | Status: DC | PRN
Start: 2021-04-03 — End: 2022-02-21

## 2021-04-28 ENCOUNTER — Encounter: Payer: Self-pay | Admitting: Adult Health

## 2021-04-28 ENCOUNTER — Other Ambulatory Visit: Payer: Self-pay | Admitting: *Deleted

## 2021-04-28 MED ORDER — PRAMIPEXOLE DIHYDROCHLORIDE ER 0.75 MG PO TB24: 0.7500 mg | ORAL_TABLET | Freq: Every day | ORAL | 0 refills | Status: DC

## 2021-05-02 ENCOUNTER — Encounter: Payer: Self-pay | Admitting: Sports Medicine

## 2021-05-18 ENCOUNTER — Telehealth: Payer: Self-pay | Admitting: *Deleted

## 2021-05-18 DIAGNOSIS — Z1231 Encounter for screening mammogram for malignant neoplasm of breast: Secondary | ICD-10-CM | POA: Diagnosis not present

## 2021-05-18 DIAGNOSIS — Z853 Personal history of malignant neoplasm of breast: Secondary | ICD-10-CM | POA: Diagnosis not present

## 2021-05-18 DIAGNOSIS — Z803 Family history of malignant neoplasm of breast: Secondary | ICD-10-CM | POA: Diagnosis not present

## 2021-05-18 NOTE — Telephone Encounter (Signed)
Received faxed request from [facility] in [place] for post 2 mammograms to be put on a disc or downloaded via [place] with readings.  " STAT please " written on request.  This RN left message with with Volanda Napoleon at the [facility] per above request.  Awaiting return call

## 2021-06-07 DIAGNOSIS — M25572 Pain in left ankle and joints of left foot: Secondary | ICD-10-CM | POA: Diagnosis not present

## 2021-06-07 DIAGNOSIS — M7742 Metatarsalgia, left foot: Secondary | ICD-10-CM | POA: Diagnosis not present

## 2021-06-23 DIAGNOSIS — M7542 Impingement syndrome of left shoulder: Secondary | ICD-10-CM | POA: Diagnosis not present

## 2021-06-23 DIAGNOSIS — M25512 Pain in left shoulder: Secondary | ICD-10-CM | POA: Diagnosis not present

## 2021-08-24 ENCOUNTER — Other Ambulatory Visit: Payer: Self-pay | Admitting: Adult Health

## 2021-08-24 MED ORDER — PRAMIPEXOLE DIHYDROCHLORIDE ER 0.75 MG PO TB24: 0.7500 mg | ORAL_TABLET | Freq: Every day | ORAL | 1 refills | Status: DC

## 2021-10-30 DIAGNOSIS — H43813 Vitreous degeneration, bilateral: Secondary | ICD-10-CM | POA: Diagnosis not present

## 2021-10-30 DIAGNOSIS — H16223 Keratoconjunctivitis sicca, not specified as Sjogren's, bilateral: Secondary | ICD-10-CM | POA: Diagnosis not present

## 2021-10-30 DIAGNOSIS — Z23 Encounter for immunization: Secondary | ICD-10-CM | POA: Diagnosis not present

## 2021-10-30 DIAGNOSIS — H25813 Combined forms of age-related cataract, bilateral: Secondary | ICD-10-CM | POA: Diagnosis not present

## 2021-12-15 ENCOUNTER — Encounter: Payer: Self-pay | Admitting: Adult Health

## 2021-12-20 ENCOUNTER — Telehealth: Payer: Self-pay | Admitting: Adult Health

## 2021-12-20 ENCOUNTER — Telehealth (INDEPENDENT_AMBULATORY_CARE_PROVIDER_SITE_OTHER): Payer: Medicare PPO | Admitting: Adult Health

## 2021-12-20 DIAGNOSIS — Z9989 Dependence on other enabling machines and devices: Secondary | ICD-10-CM | POA: Diagnosis not present

## 2021-12-20 DIAGNOSIS — G4733 Obstructive sleep apnea (adult) (pediatric): Secondary | ICD-10-CM

## 2021-12-20 DIAGNOSIS — G2581 Restless legs syndrome: Secondary | ICD-10-CM

## 2021-12-20 NOTE — Progress Notes (Addendum)
PATIENT: Alexis Fox DOB: May 12, 1956  REASON FOR VISIT: follow up HISTORY FROM: patient PRIMARY NEUROLOGIST:   Virtual Visit via Video Note  I connected with Alexis Fox on 12/20/21 at 10:00 AM EST by a video enabled telemedicine application located remotely at [facility] and verified that I am speaking with the correct person using two identifiers who was located at their own home.   I discussed the limitations of evaluation and management by telemedicine and the availability of in person appointments. The patient expressed understanding and agreed to proceed.   PATIENT: Alexis Fox DOB: Apr 24, 1956  REASON FOR VISIT: follow up HISTORY FROM: patient  HISTORY OF PRESENT ILLNESS: Today 12/20/21:  Alexis Fox is a 66 year old female with a history of obstructive sleep apnea on CPAP.  She returns today for follow-up.  Her Internet connection is not great today keeps breaking up.  She is located in [place].  Reports that she has not used the CPAP consistently in the last month due to having the flu.  Otherwise she feels that it works great for her.  She reports that her regimen for her restless legs is working well for her however in the last couple months she has been having breakthrough symptoms in the afternoon.  This does not occur consistently daily but several times a week.  Currently she is taking Mirapex 0.5 mg and Mirapex extended release 0.75 mg at bedtime.    REVIEW OF SYSTEMS: Out of a complete 14 system review of symptoms, the patient complains only of the following symptoms, and all other reviewed systems are negative.  ALLERGIES: Allergies  Allergen Reactions   Turmeric Diarrhea    Severe diarrhea   Decadron [Dexamethasone]     Exacerbates restless leg    HOME MEDICATIONS: Outpatient Medications Prior to Visit  Medication Sig Dispense Refill   Ascorbic Acid (VITAMIN C) 500 MG CAPS 1 tablet     buPROPion  (WELLBUTRIN XL) 150 MG 24 hr tablet Take 150 mg by mouth daily.  4   cetirizine (ZYRTEC) 10 MG tablet Take 10 mg by mouth at bedtime.      Cholecalciferol (VITAMIN D3) 50 MCG (2000 UT) TABS Take 2,000 Units by mouth daily.      citalopram (CELEXA) 40 MG tablet Take 40 mg by mouth at bedtime.   3   clobetasol ointment (TEMOVATE) 0.86 % Apply 1 application topically 2 (two) times daily. 30 g 1   clonazePAM (KLONOPIN) 1 MG tablet Take 1 mg by mouth at bedtime.      cycloSPORINE (RESTASIS) 0.05 % ophthalmic emulsion Place 1 drop into both eyes 2 (two) times daily.     docusate sodium (COLACE) 100 MG capsule Take 100 mg by mouth 2 (two) times daily.     esomeprazole (NEXIUM) 40 MG capsule Take 1 capsule by mouth 2 (two) times daily.     Ferrous Sulfate (IRON) 325 (65 Fe) MG TABS 1 tablet     fluticasone (FLONASE) 50 MCG/ACT nasal spray one puff     ipratropium (ATROVENT) 0.06 % nasal spray 2 sprays in each nostril as needed     Iron-Vitamin C (IRON 100/C PO) Take 1 tablet by mouth in the morning and at bedtime.      pramipexole (MIRAPEX) 0.5 MG tablet Take 1 tablet (0.5 mg total) by mouth daily as needed. 90 tablet 3   Pramipexole Dihydrochloride (MIRAPEX ER) 0.75 MG TB24 Take 1 tablet (0.75 mg total) by mouth daily.  90 tablet 1   Facility-Administered Medications Prior to Visit  Medication Dose Route Frequency Provider Last Rate Last Admin   sodium chloride flush (NS) 0.9 % injection 10 mL  10 mL Intracatheter PRN Nicholas Lose, MD   10 mL at 07/19/16 1434    PAST MEDICAL HISTORY: Past Medical History:  Diagnosis Date   Anxiety    Arthritis    knees   Breast cancer ([place])    Breast cancer of upper-outer quadrant of left female breast ([place]) 03/20/2016   C2 cervical fracture ([place])    10/22/18, following fall from ladder   Cervicalgia    Cholecystitis    Complication of anesthesia    BP "crashes" post op   Depression    Early cataracts, bilateral    Family history of brain cancer     Family history of breast cancer    GERD (gastroesophageal reflux disease)    Headache    " from my neck"   Hot flashes    Personal history of chemotherapy    Personal history of radiation therapy    Restless leg syndrome    Sleep apnea 10/03/2018   wears CPAP    PAST SURGICAL HISTORY: Past Surgical History:  Procedure Laterality Date   ANTERIOR CERVICAL DECOMP/DISCECTOMY FUSION N/A 01/22/2020   Procedure: ACDF - C2-C3;  Surgeon: Eustace Moore, MD;  Location: [place];  Service: Neurosurgery;  Laterality: N/A;   BACK SURGERY     x2   BREAST BIOPSY     BREAST LUMPECTOMY Left 2018   BUNIONECTOMY     CHOLECYSTECTOMY N/A 10/04/2015   Procedure: LAPAROSCOPIC CHOLECYSTECTOMY WITH INTRAOPERATIVE CHOLANGIOGRAM;  Surgeon: Greer Pickerel, MD;  Location: [place];  Service: General;  Laterality: N/A;   ELBOW SURGERY     right for epicondylitis 2010    ESOPHAGEAL MANOMETRY N/A 11/30/2020   Procedure: ESOPHAGEAL MANOMETRY (EM);  Surgeon: Ronnette Juniper, MD;  Location: [facility];  Service: Gastroenterology;  Laterality: N/A;   FOOT SURGERY     1983 -tarsal tunnel release   IR GENERIC HISTORICAL  10/26/2016   IR CV LINE INJECTION 10/26/2016 [facility]   LUMBAR DISC SURGERY  04/04/2012   MASS EXCISION Left 02/06/2021   Procedure: EXCISION LEFT FOOT SOFT TISSUE BETWEEN SECOND AND THIRD AND THIRD AND FOURTH TOES;  Surgeon: Landis Martins, DPM;  Location: [place];  Service: Podiatry;  Laterality: Left;   PORT-A-CATH REMOVAL N/A 11/15/2016   Procedure: REMOVAL PORT-A-CATH;  Surgeon: Rolm Bookbinder, MD;  Location: [place];  Service: General;  Laterality: N/A;   PORTACATH PLACEMENT Right 04/02/2016   Procedure: INSERTION PORT-A-CATH WITH [place];  Surgeon: Rolm Bookbinder, MD;  Location: [place];  Service: General;  Laterality: Right;   RADIOACTIVE SEED GUIDED PARTIAL MASTECTOMY/AXILLARY SENTINEL NODE BIOPSY/AXILLARY NODE DISSECTION Left 09/12/2016    Procedure: LEFT BREAST SEED GUIDED LUMPECTOMY, LEFT AXILLARY SENTINEL NODE BIOPSY, LEFT SEED GUIDED AXILLARY NODE EXCISION( TARGETED AXILLARY DISSECTION), BLUE DYE INJECTION;  Surgeon: Rolm Bookbinder, MD;  Location: [place];  Service: General;  Laterality: Left;   SACROILIAC JOINT FUSION Left 05/18/2020   Procedure: LEFT SACROILIAC JOINT FUSION;  Surgeon: Phylliss Bob, MD;  Location: [place];  Service: Orthopedics;  Laterality: Left;   SPINAL FUSION  5/12   L5-S1   SPINAL FUSION  05/18/2019   L4-5   TARSAL TUNNEL RELEASE      FAMILY HISTORY: Family History  Problem Relation Age of Onset   Heart disease Mother  Pulmonary fibrosis Mother    Hypertension Mother    Cancer Father 36       astocytoma   Hypertension Brother    Lymphoma Maternal Aunt    Anesthesia problems Neg Hx     SOCIAL HISTORY: Social History   Socioeconomic History   Marital status: Married    Spouse name: Not on file   Number of children: 2   Years of education: Not on file   Highest education level: Not on file  Occupational History   Not on file  Tobacco Use   Smoking status: Never   Smokeless tobacco: Never  Vaping Use   Vaping Use: Never used  Substance and Sexual Activity   Alcohol use: Yes    Alcohol/week: 1.0 standard drink    Types: 1 Cans of beer per week    Comment: occasional   Drug use: No   Sexual activity: Not Currently    Birth control/protection: Post-menopausal  Other Topics Concern   Not on file  Social History Narrative   Not on file   Social Determinants of Health   Financial Resource Strain: Not on file  Food Insecurity: Not on file  Transportation Needs: Not on file  Physical Activity: Not on file  Stress: Not on file  Social Connections: Not on file  Intimate Partner Violence: Not on file      PHYSICAL EXAM Generalized: Well developed, in no acute distress   Neurological examination  Mentation: Alert oriented to time, place, history  taking. Follows all commands speech and language fluent Cranial nerve II-XII:Facial symmetry noted. Head turning and shoulder shrug  were normal and symmetric.  Gait and station: Patient is able to stand from a seated position. gait is normal.  Reflexes: UTA  DIAGNOSTIC DATA (LABS, IMAGING, TESTING) - I reviewed patient records, labs, notes, testing and imaging myself where available.  Lab Results  Component Value Date   WBC 6.2 05/10/2020   HGB 12.6 05/10/2020   HCT 40.2 05/10/2020   MCV 95.9 05/10/2020   PLT 228 05/10/2020      Component Value Date/Time   NA 146 (H) 05/10/2020 1344   NA 139 05/27/2017 1126   K 4.0 05/10/2020 1344   K 4.0 05/27/2017 1126   CL 108 05/10/2020 1344   CO2 26 05/10/2020 1344   CO2 28 05/27/2017 1126   GLUCOSE 91 05/10/2020 1344   GLUCOSE 92 05/27/2017 1126   BUN 20 05/10/2020 1344   BUN 17.9 05/27/2017 1126   CREATININE 0.83 05/10/2020 1344   CREATININE 1.0 05/27/2017 1126   CALCIUM 9.5 05/10/2020 1344   CALCIUM 10.0 05/27/2017 1126   PROT 6.7 05/10/2020 1344   PROT 6.9 09/15/2018 1310   PROT 7.4 05/27/2017 1126   ALBUMIN 3.6 05/10/2020 1344   ALBUMIN 3.9 05/27/2017 1126   AST 22 05/10/2020 1344   AST 28 05/27/2017 1126   ALT 20 05/10/2020 1344   ALT 52 05/27/2017 1126   ALKPHOS 68 05/10/2020 1344   ALKPHOS 69 05/27/2017 1126   BILITOT 0.5 05/10/2020 1344   BILITOT 0.85 05/27/2017 1126   GFRNONAA >60 05/10/2020 1344   GFRAA >60 05/10/2020 1344   No results found for: CHOL, HDL, LDLCALC, LDLDIRECT, TRIG, CHOLHDL No results found for: HGBA1C No results found for: VITAMINB12 Lab Results  Component Value Date   TSH 2.680 09/15/2018      ASSESSMENT AND PLAN 65 y.o. year old female  has a past medical history of Anxiety, Arthritis, Breast cancer ([place]), Breast  cancer of upper-outer quadrant of left female breast ([place]) (03/20/2016), C2 cervical fracture ([place]), Cervicalgia, Cholecystitis, Complication of anesthesia, Depression, Early  cataracts, bilateral, Family history of brain cancer, Family history of breast cancer, GERD (gastroesophageal reflux disease), Headache, Hot flashes, Personal history of chemotherapy, Personal history of radiation therapy, Restless leg syndrome, and Sleep apnea (10/03/2018). here with:  OSA on CPAP  CPAP compliance good. Had the flu therefore their was some nights that she cannot use greater than 4 hours Residual AHI is good Encouraged patient to continue using CPAP nightly and > 4 hours each night  RLS  Continue Mirapex 0.75 mg daily at bedtime Advised that she can take Mirapex 0.5 mg immediate release earlier in the afternoon to prevent/treat breakthrough symptoms Advised to send [place] a MyChart message if this is not working well for her F/U in 1 year or sooner if needed   Ward Givens, MSN, NP-C 12/20/2021, 10:06 AM [facility] [address], [place], [place] [address] [phone]

## 2021-12-20 NOTE — Telephone Encounter (Signed)
Contacted pt to remind of MyChart Video Visit. Pt was already on and ready for appt.

## 2022-01-13 DIAGNOSIS — R197 Diarrhea, unspecified: Secondary | ICD-10-CM | POA: Diagnosis not present

## 2022-01-13 DIAGNOSIS — R109 Unspecified abdominal pain: Secondary | ICD-10-CM | POA: Diagnosis not present

## 2022-01-23 ENCOUNTER — Other Ambulatory Visit: Payer: Self-pay | Admitting: *Deleted

## 2022-01-23 MED ORDER — PRAMIPEXOLE DIHYDROCHLORIDE ER 0.75 MG PO TB24: 0.7500 mg | ORAL_TABLET | Freq: Every day | ORAL | 3 refills | Status: DC

## 2022-01-24 DIAGNOSIS — K30 Functional dyspepsia: Secondary | ICD-10-CM | POA: Diagnosis not present

## 2022-02-18 ENCOUNTER — Other Ambulatory Visit: Payer: Self-pay | Admitting: Adult Health

## 2022-02-21 DIAGNOSIS — K30 Functional dyspepsia: Secondary | ICD-10-CM | POA: Diagnosis not present

## 2022-04-05 DIAGNOSIS — M461 Sacroiliitis, not elsewhere classified: Secondary | ICD-10-CM | POA: Diagnosis not present

## 2022-04-05 DIAGNOSIS — M960 Pseudarthrosis after fusion or arthrodesis: Secondary | ICD-10-CM | POA: Diagnosis not present

## 2022-04-05 DIAGNOSIS — Z981 Arthrodesis status: Secondary | ICD-10-CM | POA: Diagnosis not present

## 2022-04-13 DIAGNOSIS — R937 Abnormal findings on diagnostic imaging of other parts of musculoskeletal system: Secondary | ICD-10-CM | POA: Diagnosis not present

## 2022-04-13 DIAGNOSIS — M461 Sacroiliitis, not elsewhere classified: Secondary | ICD-10-CM | POA: Diagnosis not present

## 2022-04-13 DIAGNOSIS — M48061 Spinal stenosis, lumbar region without neurogenic claudication: Secondary | ICD-10-CM | POA: Diagnosis not present

## 2022-04-24 DIAGNOSIS — M5116 Intervertebral disc disorders with radiculopathy, lumbar region: Secondary | ICD-10-CM | POA: Diagnosis not present

## 2022-04-24 DIAGNOSIS — Z981 Arthrodesis status: Secondary | ICD-10-CM | POA: Diagnosis not present

## 2022-04-24 DIAGNOSIS — M48062 Spinal stenosis, lumbar region with neurogenic claudication: Secondary | ICD-10-CM | POA: Diagnosis not present

## 2022-04-24 DIAGNOSIS — M5136 Other intervertebral disc degeneration, lumbar region: Secondary | ICD-10-CM | POA: Diagnosis not present

## 2022-05-28 DIAGNOSIS — M47896 Other spondylosis, lumbar region: Secondary | ICD-10-CM | POA: Diagnosis not present

## 2022-06-18 ENCOUNTER — Other Ambulatory Visit: Payer: Self-pay | Admitting: Internal Medicine

## 2022-06-18 DIAGNOSIS — Z1231 Encounter for screening mammogram for malignant neoplasm of breast: Secondary | ICD-10-CM

## 2022-06-29 DIAGNOSIS — G629 Polyneuropathy, unspecified: Secondary | ICD-10-CM | POA: Diagnosis not present

## 2022-06-29 DIAGNOSIS — Z853 Personal history of malignant neoplasm of breast: Secondary | ICD-10-CM | POA: Diagnosis not present

## 2022-06-29 DIAGNOSIS — Z8619 Personal history of other infectious and parasitic diseases: Secondary | ICD-10-CM | POA: Diagnosis not present

## 2022-06-29 DIAGNOSIS — N9089 Other specified noninflammatory disorders of vulva and perineum: Secondary | ICD-10-CM | POA: Diagnosis not present

## 2022-06-29 DIAGNOSIS — Z124 Encounter for screening for malignant neoplasm of cervix: Secondary | ICD-10-CM | POA: Diagnosis not present

## 2022-07-03 DIAGNOSIS — M5136 Other intervertebral disc degeneration, lumbar region: Secondary | ICD-10-CM | POA: Diagnosis not present

## 2022-07-03 DIAGNOSIS — M5416 Radiculopathy, lumbar region: Secondary | ICD-10-CM | POA: Diagnosis not present

## 2022-07-04 DIAGNOSIS — M5416 Radiculopathy, lumbar region: Secondary | ICD-10-CM | POA: Diagnosis not present

## 2022-07-04 DIAGNOSIS — R102 Pelvic and perineal pain: Secondary | ICD-10-CM | POA: Diagnosis not present

## 2022-07-04 DIAGNOSIS — R200 Anesthesia of skin: Secondary | ICD-10-CM | POA: Diagnosis not present

## 2022-07-04 DIAGNOSIS — M545 Low back pain, unspecified: Secondary | ICD-10-CM | POA: Diagnosis not present

## 2022-07-05 ENCOUNTER — Ambulatory Visit
Admission: RE | Admit: 2022-07-05 | Discharge: 2022-07-05 | Disposition: A | Discharge status: Home / Self Care | Payer: Medicare PPO | Source: Ambulatory Visit | Attending: Internal Medicine | Admitting: Internal Medicine

## 2022-07-05 DIAGNOSIS — Z1231 Encounter for screening mammogram for malignant neoplasm of breast: Secondary | ICD-10-CM

## 2022-07-09 DIAGNOSIS — Z853 Personal history of malignant neoplasm of breast: Secondary | ICD-10-CM | POA: Diagnosis not present

## 2022-07-09 DIAGNOSIS — Z923 Personal history of irradiation: Secondary | ICD-10-CM | POA: Diagnosis not present

## 2022-07-09 DIAGNOSIS — N6489 Other specified disorders of breast: Secondary | ICD-10-CM | POA: Diagnosis not present

## 2022-07-12 DIAGNOSIS — R4189 Other symptoms and signs involving cognitive functions and awareness: Secondary | ICD-10-CM | POA: Diagnosis not present

## 2022-07-12 DIAGNOSIS — K219 Gastro-esophageal reflux disease without esophagitis: Secondary | ICD-10-CM | POA: Diagnosis not present

## 2022-07-12 DIAGNOSIS — R7989 Other specified abnormal findings of blood chemistry: Secondary | ICD-10-CM | POA: Diagnosis not present

## 2022-07-12 DIAGNOSIS — Z853 Personal history of malignant neoplasm of breast: Secondary | ICD-10-CM | POA: Diagnosis not present

## 2022-07-12 DIAGNOSIS — R32 Unspecified urinary incontinence: Secondary | ICD-10-CM | POA: Diagnosis not present

## 2022-07-12 DIAGNOSIS — Z23 Encounter for immunization: Secondary | ICD-10-CM | POA: Diagnosis not present

## 2022-07-12 DIAGNOSIS — G2581 Restless legs syndrome: Secondary | ICD-10-CM | POA: Diagnosis not present

## 2022-07-12 DIAGNOSIS — G4733 Obstructive sleep apnea (adult) (pediatric): Secondary | ICD-10-CM | POA: Diagnosis not present

## 2022-07-12 DIAGNOSIS — Z0000 Encounter for general adult medical examination without abnormal findings: Secondary | ICD-10-CM | POA: Diagnosis not present

## 2022-07-12 DIAGNOSIS — F325 Major depressive disorder, single episode, in full remission: Secondary | ICD-10-CM | POA: Diagnosis not present

## 2022-07-12 DIAGNOSIS — R5383 Other fatigue: Secondary | ICD-10-CM | POA: Diagnosis not present

## 2022-07-12 DIAGNOSIS — M79672 Pain in left foot: Secondary | ICD-10-CM | POA: Diagnosis not present

## 2022-07-23 DIAGNOSIS — M5416 Radiculopathy, lumbar region: Secondary | ICD-10-CM | POA: Diagnosis not present

## 2022-07-25 ENCOUNTER — Other Ambulatory Visit: Payer: Self-pay | Admitting: Neurological Surgery

## 2022-08-09 NOTE — Pre-Procedure Instructions (Signed)
Surgical Instructions    Your procedure is scheduled on Friday September 22.  Report to [facility] Main Entrance "A" at 9:00 A.M., then check in with the Admitting office.  Call this number if you have problems the morning of surgery:  [phone]   If you have any questions prior to your surgery date call [phone]: Open Monday-Friday 8am-4pm    Remember:  Do not eat or drink after midnight the night before your surgery   Take these medicines the morning of surgery with A SIP OF WATER:  buPROPion (WELLBUTRIN XL)  esomeprazole (NEXIUM) Pramipexole Dihydrochloride (MIRAPEX ER)  IF NEEDED: fluticasone (FLONASE)   As of today, STOP taking any Aspirin (unless otherwise instructed by your surgeon) Aleve, Naproxen, Ibuprofen, Motrin, Advil, Goody's, BC's, all herbal medications, fish oil, and all vitamins.           Do not wear jewelry or makeup. Do not wear lotions, powders, perfumes or deodorant. Do not shave 48 hours prior to surgery.  Do not bring valuables to the hospital. [facility] is not responsible for any belongings or valuables.   Do not wear nail polish, gel polish, artificial nails, or any other type of covering on natural nails (fingers and toes) If you have artificial nails or gel coating that need to be removed by a nail salon, please have this removed prior to surgery. Artificial nails or gel coating may interfere with anesthesia's ability to adequately monitor your vital signs.  Do NOT Smoke (Tobacco/Vaping)  24 hours prior to your procedure  If you use a CPAP at night, you may bring your mask for your overnight stay.   Contacts, glasses, hearing aids, dentures or partials may not be worn into surgery, please bring cases for these belongings   For patients admitted to the hospital, discharge time will be determined by your treatment team.   Patients discharged the day of surgery will not be allowed to drive home, and someone needs to stay with them for 24  hours.   SURGICAL WAITING ROOM VISITATION Patients having surgery or a procedure may have no more than 2 support people in the waiting area - these visitors may rotate.   Children under the age of 39 must have an adult with them who is not the patient. If the patient needs to stay at the hospital during part of their recovery, the visitor guidelines for inpatient rooms apply. Pre-op nurse will coordinate an appropriate time for 1 support person to accompany patient in pre-op.  This support person may not rotate.   Please refer to the [facility] website for the visitor guidelines for Inpatients (after your surgery is over and you are in a regular room).    Special instructions:    Oral Hygiene is also important to reduce your risk of infection.  Remember - BRUSH YOUR TEETH THE MORNING OF SURGERY WITH YOUR REGULAR TOOTHPASTE   [facility]- Preparing For Surgery  Before surgery, you can play an important role. Because skin is not sterile, your skin needs to be as free of germs as possible. You can reduce the number of germs on your skin by washing with CHG (chlorahexidine gluconate) Soap before surgery.  CHG is an antiseptic cleaner which kills germs and bonds with the skin to continue killing germs even after washing.     Please do not use if you have an allergy to CHG or antibacterial soaps. If your skin becomes reddened/irritated stop using the CHG.  Do not shave (including  legs and underarms) for at least 48 hours prior to first CHG shower. It is OK to shave your face.  Please follow these instructions carefully.     Shower the NIGHT BEFORE SURGERY and the MORNING OF SURGERY with CHG Soap.   If you chose to wash your hair, wash your hair first as usual with your normal shampoo. After you shampoo, rinse your hair and body thoroughly to remove the shampoo.  Then [employer] and genitals (private parts) with your normal soap and rinse thoroughly to remove soap.  After that Use CHG Soap as  you would any other liquid soap. You can apply CHG directly to the skin and wash gently with a scrungie or a clean washcloth.   Apply the CHG Soap to your body ONLY FROM THE NECK DOWN.  Do not use on open wounds or open sores. Avoid contact with your eyes, ears, mouth and genitals (private parts). Wash Face and genitals (private parts)  with your normal soap.   Wash thoroughly, paying special attention to the area where your surgery will be performed.  Thoroughly rinse your body with warm water from the neck down.  DO NOT shower/wash with your normal soap after using and rinsing off the CHG Soap.  Pat yourself dry with a CLEAN TOWEL.  Wear CLEAN PAJAMAS to bed the night before surgery  Place CLEAN SHEETS on your bed the night before your surgery  DO NOT SLEEP WITH PETS.   Day of Surgery:  Take a shower with CHG soap. Wear Clean/Comfortable clothing the morning of surgery Do not apply any deodorants/lotions.   Remember to brush your teeth WITH YOUR REGULAR TOOTHPASTE.    If you received a COVID test during your pre-op visit, it is requested that you wear a mask when out in public, stay away from anyone that may not be feeling well, and notify your surgeon if you develop symptoms. If you have been in contact with anyone that has tested positive in the last 10 days, please notify your surgeon.    Please read over the following fact sheets that you were given.

## 2022-08-10 ENCOUNTER — Encounter (HOSPITAL_COMMUNITY): Payer: Self-pay

## 2022-08-10 ENCOUNTER — Encounter (HOSPITAL_COMMUNITY)
Admission: RE | Admit: 2022-08-10 | Discharge: 2022-08-10 | Disposition: A | Discharge status: Home / Self Care | Payer: Medicare PPO | Source: Ambulatory Visit | Attending: Neurological Surgery | Admitting: Neurological Surgery

## 2022-08-10 ENCOUNTER — Other Ambulatory Visit: Payer: Self-pay

## 2022-08-10 VITALS — BP 123/56 | HR 68 | Temp 98.2°F | Resp 17 | Ht 64.0 in | Wt 216.0 lb

## 2022-08-10 DIAGNOSIS — Z01818 Encounter for other preprocedural examination: Secondary | ICD-10-CM | POA: Diagnosis not present

## 2022-08-10 LAB — PROTIME-INR
INR: 1 (ref 0.8–1.2)
Prothrombin Time: 13.3 seconds (ref 11.4–15.2)

## 2022-08-10 LAB — CBC
HCT: 42.2 % (ref 36.0–46.0)
Hemoglobin: 13.6 g/dL (ref 12.0–15.0)
MCH: 30.9 pg (ref 26.0–34.0)
MCHC: 32.2 g/dL (ref 30.0–36.0)
MCV: 95.9 fL (ref 80.0–100.0)
Platelets: 241 10*3/uL (ref 150–400)
RBC: 4.4 MIL/uL (ref 3.87–5.11)
RDW: 13.2 % (ref 11.5–15.5)
WBC: 6.9 10*3/uL (ref 4.0–10.5)
nRBC: 0 % (ref 0.0–0.2)

## 2022-08-10 LAB — TYPE AND SCREEN
ABO/RH(D): A POS
Antibody Screen: NEGATIVE

## 2022-08-10 LAB — SURGICAL PCR SCREEN
MRSA, PCR: NEGATIVE
Staphylococcus aureus: NEGATIVE

## 2022-08-10 NOTE — Progress Notes (Signed)
PCP - Jenny Reichmann griffin Cardiologist - denies Neurology: Asencion Partridge Dohmeier Gastroenterologist: Therisa Doyne  PPM/ICD - denies   Chest x-ray - n/a EKG - n/a Stress Test - denies ECHO - 05/14/16 Cardiac Cath - denies  Sleep Study - +OSA CPAP - wears nightly. Unknown settings  Blood Thinner Instructions: Aspirin Instructions:  ERAS Protcol -no   COVID TEST- n/a   Anesthesia review: no  Patient denies shortness of breath, fever, cough and chest pain at PAT appointment   All instructions explained to the patient, with a verbal understanding of the material. Patient agrees to go over the instructions while at home for a better understanding. Patient also instructed to self quarantine after being tested for COVID-19. The opportunity to ask questions was provided.

## 2022-08-21 ENCOUNTER — Telehealth: Payer: Self-pay | Admitting: Neurology

## 2022-08-21 ENCOUNTER — Ambulatory Visit (INDEPENDENT_AMBULATORY_CARE_PROVIDER_SITE_OTHER): Payer: Medicare PPO | Admitting: Neurology

## 2022-08-21 ENCOUNTER — Encounter: Payer: Self-pay | Admitting: Neurology

## 2022-08-21 VITALS — BP 135/76 | HR 73 | Ht 64.0 in | Wt 216.0 lb

## 2022-08-21 DIAGNOSIS — G3184 Mild cognitive impairment, so stated: Secondary | ICD-10-CM | POA: Insufficient documentation

## 2022-08-21 DIAGNOSIS — Z853 Personal history of malignant neoplasm of breast: Secondary | ICD-10-CM | POA: Diagnosis not present

## 2022-08-21 DIAGNOSIS — Z9989 Dependence on other enabling machines and devices: Secondary | ICD-10-CM | POA: Insufficient documentation

## 2022-08-21 DIAGNOSIS — G2581 Restless legs syndrome: Secondary | ICD-10-CM

## 2022-08-21 DIAGNOSIS — G4733 Obstructive sleep apnea (adult) (pediatric): Secondary | ICD-10-CM | POA: Diagnosis not present

## 2022-08-21 DIAGNOSIS — G4710 Hypersomnia, unspecified: Secondary | ICD-10-CM | POA: Insufficient documentation

## 2022-08-21 DIAGNOSIS — G4719 Other hypersomnia: Secondary | ICD-10-CM | POA: Insufficient documentation

## 2022-08-21 NOTE — Telephone Encounter (Signed)
Mcarthur Rossetti Josem Kaufmann: [phone] exp. 08/21/22-09/20/22 sent to GI

## 2022-08-21 NOTE — Progress Notes (Signed)
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 We discussed back issues- the RLS that is now well controlled, and alternatives to CPAP,and why these not working for REM sleep hypoxia.   Her current question is : " where am I with my current CPAP treatment?" . She is using a nasal cradle mask, and has on her smart phone good compliance data and residual AHI under 3/h.  She was referred back to me to investigate her persistent sleepiness and fatigue. Nocturnal urinary incontinence,  cancer survivor with "chemo brain" , worried about memory loss, but has no familiar history of Dementia.  She is excessively daytime sleepy and endorsed Epworth score at 18/ 24 points while compliant with CPAP  Gait is affected by back problems. Memory can be affected by anxiety and depression, fear of cancer returning.   The patient CPAP compliance record is 93% for days and 75% 4 hours of days the average user time is 5 hours 1 minute, CPAP with a set pressure of 12 cm water with 3 cm EPR, residual AHI is 2.8/h which is an excellent resolution.  There are very few central apneas counted she does have higher air leaks.  The air leak at the 95th percentile was 24.7 L/min.   DATE OF RECORDING:        09/28/2018 REFERRING M.D.:                 [name], MD. Study Performed:   Baseline Polysomnogram   HISTORY: " I am fatigued, have RLS and snore loudly"    Alexis. 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  Her current symptoms include memory  loss, confusion, headaches, insomnia which has been present since 1998, she reports excessive daytime sleepiness, loud snoring and 4 pm onset of restless legs.  She does not have a diagnosis of an anxiety disorder but raised a question of having PTSD, chronic back pain, breast cancer diagnosed in April 2017, lumpectomy 2017, fusion back surgery L5-S1 in May 2013, laparoscopic cholecystectomy in 2016 January, she recently received facet injections bilateral at L4-5.  She has had restless leg symptoms for most of her life. She was last seen here 2014 after a sleep study had been obtained elsewhere, at the time ordered by Dr. [name] and interpreted by Keturah Barre. She did have fragmented sleep at the time but there was no significant apnea noted, she did snore loudly but did not wake up from snoring, she did not have periodic limb movements at the time and her sleep architecture revealed a delayed REM sleep onset and lower REM sleep proportion which was not surprising as she was at the time taking clonazepam Citalopram and Pramipexole as well as Bupropion.   The patient endorsed the Epworth Sleepiness Scale at 12/24 points.   The patient's weight 194 pounds with a height of 64 (inches), resulting in a BMI of 33.1 kg/m2. The patient's neck circumference measured 14 inches.      BASELINE STUDY: Lights  Out was at 21:16 and Lights On at 05:01.  Total recording time (TRT) was 466 minutes, with a total sleep time (TST) of 337 minutes.  The patient's sleep latency was 48.5 minutes.  REM latency was 190 minutes.  The sleep efficiency was 72.3 %.                     SLEEP ARCHITECTURE: WASO (Wake after sleep onset) was 80.5 minutes.  There were 12 minutes in Stage N1, 252 minutes Stage N2, 10.5 minutes Stage N3 and 62.5 minutes in Stage REM.  The percentage of Stage N1 was 3.6%, Stage N2 was 74.8%, Stage N3 was 3.1% and Stage R (REM sleep) was 18.5%.    RESPIRATORY ANALYSIS:  There were a total of 108  respiratory events:  25 obstructive apneas, 83 hypopneas with a hypopnea index of 14.8 /hour.    The total APNEA/HYPOPNEA INDEX (AHI) was 19.2/hour.  28 events occurred in REM sleep and 130 events in NREM. The REM AHI was 28.9 /hour, versus a non-REM AHI of 17.5. The patient spent 180 minutes of total sleep time in the supine position and 157 minutes in non-supine. The supine AHI was 24.6/h versus a non-supine AHI of 12.9/h.   OXYGEN SATURATION & C02:  The Wake baseline 02 saturation was 96%, with the lowest being 75%. Time spent below 89% saturation equaled 17 minutes.                                                          PERIODIC LIMB MOVEMENTS:  The arousals were noted as: 33 were spontaneous, 0 were associated with PLMs, and 108 were associated with respiratory events. The patient had a total of 0 Periodic Limb Movements.   Audio and video analysis did not show any abnormal or unusual movements, behaviors, phonations or vocalizations.      IMPRESSION:   Moderate Obstructive Sleep Apnea (OSA) at AHI 19.2, REM accentuated AHI 26.9/h and the supine AHI was 24.6/h.      MM: Ms. Fox is a 66 year old female with a history of obstructive sleep apnea on CPAP.  She returns today for follow-up.  Her Internet connection is not great today keeps breaking up.  She is located in [place].  Reports that she has not used the CPAP consistently in the last month due to having the flu.  Otherwise she feels that it works great for her.  She reports that her regimen for her restless legs is working well for her however in the last couple months she has been having breakthrough symptoms in the afternoon.  This does not occur consistently daily but several times a week.  Currently she is taking Mirapex 0.5 mg and Mirapex extended release 0.75 mg at bedtime.    REVIEW OF SYSTEMS: Out of a complete 14 system review of symptoms, the patient complains only of the following symptoms, and all other reviewed systems  are negative.  ALLERGIES: Allergies  Allergen Reactions   Turmeric Diarrhea    Severe diarrhea   Decadron [Dexamethasone]     Exacerbates restless leg    HOME MEDICATIONS: Outpatient Medications Prior to Visit  Medication Sig Dispense Refill   Ascorbic Acid (VITAMIN C) 500 MG CAPS Take 500 mg by mouth daily.     buPROPion (WELLBUTRIN XL)  150 MG 24 hr tablet Take 150 mg by mouth daily.  4   cholecalciferol (VITAMIN D3) 25 MCG (1000 UNIT) tablet Take 1,000 Units by mouth in the morning and at bedtime.     citalopram (CELEXA) 40 MG tablet Take 40 mg by mouth at bedtime.   3   clobetasol ointment (TEMOVATE) 3.79 % Apply 1 application topically 2 (two) times daily. (Patient taking differently: Apply 1 application  topically 2 (two) times daily as needed (irritation).) 30 g 1   clonazePAM (KLONOPIN) 1 MG tablet Take 1 mg by mouth at bedtime.      docusate sodium (COLACE) 100 MG capsule Take 100 mg by mouth daily.     esomeprazole (NEXIUM) 40 MG capsule Take 40 mg by mouth 2 (two) times daily.     Ferrous Sulfate (IRON) 325 (65 Fe) MG TABS Take 325 mg by mouth daily.     fluticasone (FLONASE) 50 MCG/ACT nasal spray Place 1 spray into both nostrils daily as needed for allergies.     Multiple Vitamins-Minerals (MULTIVITAMIN WITH MINERALS) tablet Take 1 tablet by mouth at bedtime.     pramipexole (MIRAPEX) 0.5 MG tablet TAKE 1 TABLET (0.5 MG TOTAL) BY MOUTH DAILY AS NEEDED (TAKE ALONG WITH EXTENDED RELEASE DOSE) (Patient taking differently: Take 0.5 mg by mouth See admin instructions. 0.5 mg in the afternoon as needed, 0.5 mg at bedtime) 90 tablet 3   Pramipexole Dihydrochloride (MIRAPEX ER) 0.75 MG TB24 Take 1 tablet (0.75 mg total) by mouth daily. (Patient taking differently: Take 0.75 mg by mouth as needed.) 90 tablet 3   Facility-Administered Medications Prior to Visit  Medication Dose Route Frequency Provider Last Rate Last Admin   sodium chloride flush (NS) 0.9 % injection 10 mL  10 mL  Intracatheter PRN Nicholas Lose, MD   10 mL at 07/19/16 1434    PAST MEDICAL HISTORY: Past Medical History:  Diagnosis Date   Anxiety    Arthritis    knees   Breast cancer ([place])    Breast cancer of upper-outer quadrant of left female breast ([place]) 03/20/2016   C2 cervical fracture ([place])    10/22/18, following fall from ladder   Cervicalgia    Cholecystitis    Complication of anesthesia    BP "crashes" post op   Depression    Early cataracts, bilateral    Family history of brain cancer    Family history of breast cancer    GERD (gastroesophageal reflux disease)    Headache    " from my neck"   Hot flashes    Personal history of chemotherapy    Personal history of radiation therapy    Restless leg syndrome    Sleep apnea 10/03/2018   wears CPAP    PAST SURGICAL HISTORY: Past Surgical History:  Procedure Laterality Date   ANTERIOR CERVICAL DECOMP/DISCECTOMY FUSION N/A 01/22/2020   Procedure: ACDF - C2-C3;  Surgeon: Eustace Moore, MD;  Location: [facility];  Service: Neurosurgery;  Laterality: N/A;   BACK SURGERY     x2   BREAST BIOPSY     BREAST LUMPECTOMY Left 2018   BUNIONECTOMY     CHOLECYSTECTOMY N/A 10/04/2015   Procedure: LAPAROSCOPIC CHOLECYSTECTOMY WITH INTRAOPERATIVE CHOLANGIOGRAM;  Surgeon: Greer Pickerel, MD;  Location: [facility];  Service: General;  Laterality: N/A;   ELBOW SURGERY     right for epicondylitis 2010    ESOPHAGEAL MANOMETRY N/A 11/30/2020   Procedure: ESOPHAGEAL MANOMETRY (EM);  Surgeon: Ronnette Juniper, MD;  Location:  [facility];  Service: Gastroenterology;  Laterality: N/A;   FOOT SURGERY     1983 -tarsal tunnel release   IR GENERIC HISTORICAL  10/26/2016   IR CV LINE INJECTION 10/26/2016 [facility]   LUMBAR DISC SURGERY  04/04/2012   MASS EXCISION Left 02/06/2021   Procedure: EXCISION LEFT FOOT SOFT TISSUE BETWEEN SECOND AND THIRD AND THIRD AND FOURTH TOES;  Surgeon: Landis Martins, DPM;  Location: [facility];  Service: Podiatry;   Laterality: Left;   PORT-A-CATH REMOVAL N/A 11/15/2016   Procedure: REMOVAL PORT-A-CATH;  Surgeon: Rolm Bookbinder, MD;  Location: [facility];  Service: General;  Laterality: N/A;   PORTACATH PLACEMENT Right 04/02/2016   Procedure: INSERTION PORT-A-CATH WITH [place];  Surgeon: Rolm Bookbinder, MD;  Location: [facility];  Service: General;  Laterality: Right;   RADIOACTIVE SEED GUIDED PARTIAL MASTECTOMY/AXILLARY SENTINEL NODE BIOPSY/AXILLARY NODE DISSECTION Left 09/12/2016   Procedure: LEFT BREAST SEED GUIDED LUMPECTOMY, LEFT AXILLARY SENTINEL NODE BIOPSY, LEFT SEED GUIDED AXILLARY NODE EXCISION( TARGETED AXILLARY DISSECTION), BLUE DYE INJECTION;  Surgeon: Rolm Bookbinder, MD;  Location: [facility];  Service: General;  Laterality: Left;   SACROILIAC JOINT FUSION Left 05/18/2020   Procedure: LEFT SACROILIAC JOINT FUSION;  Surgeon: Phylliss Bob, MD;  Location: [facility];  Service: Orthopedics;  Laterality: Left;   SPINAL FUSION  5/12   L5-S1   SPINAL FUSION  05/18/2019   L4-5   TARSAL TUNNEL RELEASE      FAMILY HISTORY: Family History  Problem Relation Age of Onset   Heart disease Mother    Pulmonary fibrosis Mother    Hypertension Mother    Cancer Father 20       astocytoma   Hypertension Brother    Lymphoma Maternal Aunt    Anesthesia problems Neg Hx     SOCIAL HISTORY: Social History   Socioeconomic History   Marital status: Married    Spouse name: Not on file   Number of children: 2   Years of education: Not on file   Highest education level: Not on file  Occupational History   Not on file  Tobacco Use   Smoking status: Never   Smokeless tobacco: Never  Vaping Use   Vaping Use: Never used  Substance and Sexual Activity   Alcohol use: Yes    Alcohol/week: 1.0 standard drink of alcohol    Types: 1 Cans of beer per week    Comment: occasional   Drug use: No   Sexual activity: Not Currently    Birth control/protection:  Post-menopausal  Other Topics Concern   Not on file  Social History Narrative   Not on file   Social Determinants of Health   Financial Resource Strain: Low Risk  (10/24/2018)   Overall Financial Resource Strain (CARDIA)    Difficulty of Paying Living Expenses: Not hard at all  Food Insecurity: No Food Insecurity (10/24/2018)   Hunger Vital Sign    Worried About Running Out of Food in the Last Year: Never true    Ran Out of Food in the Last Year: Never true  Transportation Needs: No Transportation Needs (10/24/2018)   PRAPARE - Hydrologist (Medical): No    Lack of Transportation (Non-Medical): No  Physical Activity: Unknown (10/24/2018)   Exercise Vital Sign    Days of Exercise per Week: 0 days    Minutes of Exercise per Session: Not on file  Stress: No Stress Concern Present (10/24/2018)  [place] Stress Questionnaire    Feeling of Stress : Not at all  Social Connections: Not on file  Intimate Partner Violence: Not on file      PHYSICAL EXAM  Pulse regular at 79 bpm. BP was 135/76 mmHg, BMI is ???   Height 5'4" and weight: 210 lbs at home.     Generalized: Well developed, in no acute distress , no ankle edema. Feet and hands are warm.  Partial BBB , but regular rate and rhythm/ Peripheral pulses palpable; Clear to auscultation. No carotid bruit. Obese. Neck circumference was measured at 14-1/2 inches, Mallampati is grade 3.  Neurological examination  Mentation: Alert oriented to time, place, history taking. Follows all commands speech and language fluent Cranial nerve : sense of smell is intact, sense of tate reportedly intact.  Facial symmetry noted. Head turning and shoulder shrug  were normal and symmetric. Had a c2-3 fusion, and has since a small droop of the right angle of the mouth.   Gait and station: L2-3 left pain and movement restriction. , hip flexion is present , hip  adduction is weaker.  Can only raise form a seated position by bracing herself . Limp  Reflexes:  trace only in both patellae  DIAGNOSTIC DATA (LABS, IMAGING, TESTING) - I reviewed patient records, labs, notes, testing and imaging myself where available.  Lab Results  Component Value Date   WBC 6.9 08/10/2022   HGB 13.6 08/10/2022   HCT 42.2 08/10/2022   MCV 95.9 08/10/2022   PLT 241 08/10/2022      Component Value Date/Time   NA 146 (H) 05/10/2020 1344   NA 139 05/27/2017 1126   K 4.0 05/10/2020 1344   K 4.0 05/27/2017 1126   CL 108 05/10/2020 1344   CO2 26 05/10/2020 1344   CO2 28 05/27/2017 1126   GLUCOSE 91 05/10/2020 1344   GLUCOSE 92 05/27/2017 1126   BUN 20 05/10/2020 1344   BUN 17.9 05/27/2017 1126   CREATININE 0.83 05/10/2020 1344   CREATININE 1.0 05/27/2017 1126   CALCIUM 9.5 05/10/2020 1344   CALCIUM 10.0 05/27/2017 1126   PROT 6.7 05/10/2020 1344   PROT 6.9 09/15/2018 1310   PROT 7.4 05/27/2017 1126   ALBUMIN 3.6 05/10/2020 1344   ALBUMIN 3.9 05/27/2017 1126   AST 22 05/10/2020 1344   AST 28 05/27/2017 1126   ALT 20 05/10/2020 1344   ALT 52 05/27/2017 1126   ALKPHOS 68 05/10/2020 1344   ALKPHOS 69 05/27/2017 1126   BILITOT 0.5 05/10/2020 1344   BILITOT 0.85 05/27/2017 1126   GFRNONAA >60 05/10/2020 1344   GFRAA >60 05/10/2020 1344   No results found for: "CHOL", "HDL", "LDLCALC", "LDLDIRECT", "TRIG", "CHOLHDL" No results found for: "HGBA1C" No results found for: "VITAMINB12" Lab Results  Component Value Date   TSH 2.680 09/15/2018      ASSESSMENT : 66 y.o. year old female  has a past medical history of Anxiety, Arthritis, Breast cancer ([place]), Breast cancer of upper-outer quadrant of left female breast ([facility] [place]) (03/20/2016), C2 cervical fracture ([place]), Cervicalgia, Cholecystitis, Complication of anesthesia, Depression, Early cataracts, bilateral, Family history of brain cancer, Family history of breast cancer, GERD (gastroesophageal reflux disease),  Headache, Hot flashes, Personal history of chemotherapy, Personal history of radiation therapy, Restless leg syndrome, and Sleep apnea (10/03/2018). here with:   PERSITENT HYPERSOMNIA : while compliant on CPAP- machine was issued 2019 , will be up for renewal in 10-2023.  I am concerned  that the patient initially presented in October 2019 with an Epworth sleepiness score of 12 out of 24 points and is now using CPAP compliantly with an Epworth score of 18 out of 24.  There has to be a second condition that is not related to the baseline apnea.  Her fatigue severity score was endorsed at 20 scratch that 35 out of 63 points which is average.  We have treated her restless leg condition with pramipexole, the generic form of Mirapex which can be sleepiness causing.  But she is taking this at night and it should not make her daytime sleepy.  She has been checked for vitamin D and B12 levels by her primary physician who also followed her iron saturation and she has been on the appropriate supplementation.  Klonipin is also given and sleepyinss causing.  I will order an ONO while on CPAP.     OSA on CPAP  CPAP compliance good. Had the flu therefore their was some nights that she cannot use greater than 4 hours Residual AHI is good Encouraged patient to continue using CPAP nightly and > 4 hours each night  RLS  Continue Mirapex 0.75 mg daily at bedtime Advised that she can take Mirapex 0.5 mg immediate release earlier in the afternoon to prevent/treat breakthrough symptoms. She is not needing extra iron anymore. Ferritin was found elevated at 668 (!)  TIBC was normal at [facility].  Nocturnal incontinence of urine- improved after she d/c Zyrtec.    Breast cancer triple negative. [facility] - COGNITIVE IMPAIRMENT<  neurocognitive impairment since cancer treatments. I would like for the patient to undergo a MoCA testing today old CVA capital letters, her report of forgetfulness of problems to focus, she had to  delegate certain activities and competencies to her husband.  Given her history of breast cancer I will order an MRI with and without contrast as well.     Ward Givens, MSN, NP-C 08/21/2022, 1:08 PM [facility] [address] [place], [place] [address] [phone]

## 2022-08-21 NOTE — Addendum Note (Signed)
Addended by: Wyvonnia Lora on: 08/21/2022 02:27 PM   Modules accepted: Orders

## 2022-08-21 NOTE — Patient Instructions (Signed)
Hypersomnia Hypersomnia is a condition in which a person feels very tired during the day even though the person gets plenty of sleep at night. A person with this condition may take naps during the day and may find it very difficult to wake up from sleep. Hypersomnia may affect a person's ability to think, concentrate, drive, or remember things. What are the causes? The cause of this condition may not be known. Possible causes include: Taking certain medicines. Using drugs or alcohol. Sleep disorders, such as narcolepsy and sleep apnea. Injury to the head, brain, or spinal cord. Tumors. Certain medical conditions. These include: Depression. Diabetes. Gastroesophageal reflux disease (GERD). An underactive thyroid gland (hypothyroidism). What are the signs or symptoms? The main symptoms of hypersomnia include: Feeling very tired throughout the day, regardless of how much sleep you got the night before. Having trouble waking up. Others may find it difficult to wake you up when you are sleeping. Sleeping for longer and longer periods at a time. Taking naps throughout the day. Other symptoms may include: Feeling restless, anxious, or annoyed. Lacking energy. Having trouble with: Remembering. Speaking. Thinking. Loss of appetite. Seeing, hearing, tasting, smelling, or feeling things that are not real (hallucinations). How is this diagnosed? This condition may be diagnosed based on: Your symptoms and medical history. Your sleeping habits. Your health care provider may ask you to write down your sleeping habits in a daily sleep log, along with any symptoms you have. A series of tests that are done while you sleep (sleep study or polysomnogram). A test that measures how quickly you can fall asleep during the day (daytime nap study or multiple sleep latency test). How is this treated? This condition may be treated by: Following a regular sleep routine. Making lifestyle changes, such as  changing your eating habits, getting regular exercise, and avoiding alcohol or caffeinated beverages. Taking medicines to make you more alert (stimulants) during the day. Treating any underlying medical causes of hypersomnia. Follow these instructions at home: Sleep habits Stick to a routine that includes going to bed and waking up at the same times every day and night. Practice a relaxing bedtime routine. This may include reading, meditation, deep breathing, or taking a warm bath before going to sleep. Exercise regularly as told by your health care provider. However, avoid exercising in the hours right before bedtime. Keep your sleep environment at a cooler temperature, darkened, and quiet. Sleep with pillows and a mattress that are comfortable and supportive. Schedule short 20-minute naps for when you feel sleepiest during the day. Talk with your employer or teachers about your hypersomnia. If possible, adjust your schedule so that: You have a regular daytime work schedule. You can take a scheduled nap during the day. You do not have to work or be active at night. Do not eat a heavy meal for a few hours before bedtime. Eat your meals at about the same times every day. Safety  Do not drive or use machinery if you are sleepy. Ask your health care provider if it is safe for you to drive. Wear a life jacket when swimming or spending time near water. General instructions  Take over-the-counter and prescription medicines only as told by your health care provider. This includes supplements. Avoid drinking alcohol or caffeinated beverages. Keep a sleep log that will help your health care provider manage your condition. This may include information about: What time you go to bed each night. How often you wake up at night. How many hours   you sleep at night. How often and for how long you nap during the day. Any observations from others, such as leg movements during sleep, sleep walking, or  snoring. Keep all follow-up visits. This is important. Contact a health care provider if: You have new symptoms. Your symptoms get worse. Get help right away if: You have thoughts about hurting yourself or someone else. Get help right away if you feel like you may hurt yourself or others, or have thoughts about taking your own life. Go to your nearest emergency room or: Call 911. Call the National Suicide Prevention Lifeline at 1-800-273-8255 or 988. This is open 24 hours a day. Text the Crisis Text Line at 741741. Summary Hypersomnia refers to a condition in which you feel very tired during the day even though you get plenty of sleep at night. A person with this condition may take naps during the day and may find it very difficult to wake up from sleep. Hypersomnia may affect a person's ability to think, concentrate, drive, or remember things. Treatment may include a regular sleep routine and making some lifestyle changes. This information is not intended to replace advice given to you by your health care provider. Make sure you discuss any questions you have with your health care provider. Document Revised: 10/30/2021 Document Reviewed: 10/30/2021 Elsevier Patient Education  2023 [employer]  

## 2022-08-23 ENCOUNTER — Ambulatory Visit
Admission: RE | Admit: 2022-08-23 | Discharge: 2022-08-23 | Disposition: A | Discharge status: Home / Self Care | Payer: Medicare PPO | Source: Ambulatory Visit | Attending: Neurology | Admitting: Neurology

## 2022-08-23 ENCOUNTER — Encounter: Payer: Self-pay | Admitting: Psychology

## 2022-08-23 DIAGNOSIS — G4710 Hypersomnia, unspecified: Secondary | ICD-10-CM

## 2022-08-23 DIAGNOSIS — Z853 Personal history of malignant neoplasm of breast: Secondary | ICD-10-CM

## 2022-08-23 DIAGNOSIS — Z9989 Dependence on other enabling machines and devices: Secondary | ICD-10-CM

## 2022-08-23 DIAGNOSIS — G3184 Mild cognitive impairment, so stated: Secondary | ICD-10-CM | POA: Diagnosis not present

## 2022-08-23 DIAGNOSIS — G2581 Restless legs syndrome: Secondary | ICD-10-CM

## 2022-08-23 DIAGNOSIS — G4719 Other hypersomnia: Secondary | ICD-10-CM

## 2022-08-23 MED ORDER — GADOBENATE DIMEGLUMINE 529 MG/ML IV SOLN
15.0000 mL | Freq: Once | INTRAVENOUS | Status: AC | PRN
Start: 2022-08-23 — End: 2022-08-23
  Administered 2022-08-23: 15 mL via INTRAVENOUS

## 2022-08-24 ENCOUNTER — Observation Stay (HOSPITAL_COMMUNITY)
Admission: RE | Admit: 2022-08-24 | Discharge: 2022-08-26 | Disposition: A | Discharge status: Home / Self Care | Payer: Medicare PPO | Source: Ambulatory Visit | Attending: Neurological Surgery | Admitting: Neurological Surgery

## 2022-08-24 ENCOUNTER — Ambulatory Visit (HOSPITAL_COMMUNITY)
Admission: RE | Disposition: A | Discharge status: Home / Self Care | Payer: Self-pay | Source: Ambulatory Visit | Attending: Neurological Surgery

## 2022-08-24 ENCOUNTER — Encounter (HOSPITAL_COMMUNITY): Payer: Self-pay | Admitting: Neurological Surgery

## 2022-08-24 ENCOUNTER — Other Ambulatory Visit: Payer: Self-pay

## 2022-08-24 ENCOUNTER — Ambulatory Visit (HOSPITAL_COMMUNITY): Payer: Medicare PPO

## 2022-08-24 ENCOUNTER — Ambulatory Visit (HOSPITAL_COMMUNITY): Payer: Medicare PPO | Admitting: Anesthesiology

## 2022-08-24 ENCOUNTER — Ambulatory Visit (HOSPITAL_BASED_OUTPATIENT_CLINIC_OR_DEPARTMENT_OTHER): Payer: Medicare PPO | Admitting: Anesthesiology

## 2022-08-24 DIAGNOSIS — M48061 Spinal stenosis, lumbar region without neurogenic claudication: Principal | ICD-10-CM | POA: Insufficient documentation

## 2022-08-24 DIAGNOSIS — Z853 Personal history of malignant neoplasm of breast: Secondary | ICD-10-CM | POA: Insufficient documentation

## 2022-08-24 DIAGNOSIS — M4316 Spondylolisthesis, lumbar region: Secondary | ICD-10-CM | POA: Insufficient documentation

## 2022-08-24 DIAGNOSIS — M5416 Radiculopathy, lumbar region: Secondary | ICD-10-CM | POA: Diagnosis not present

## 2022-08-24 DIAGNOSIS — Z9989 Dependence on other enabling machines and devices: Secondary | ICD-10-CM

## 2022-08-24 DIAGNOSIS — G4733 Obstructive sleep apnea (adult) (pediatric): Secondary | ICD-10-CM

## 2022-08-24 DIAGNOSIS — Z981 Arthrodesis status: Secondary | ICD-10-CM

## 2022-08-24 DIAGNOSIS — M4326 Fusion of spine, lumbar region: Secondary | ICD-10-CM | POA: Diagnosis not present

## 2022-08-24 SURGERY — POSTERIOR LUMBAR FUSION 2 LEVEL
Anesthesia: General | Site: Back

## 2022-08-24 MED ORDER — LACTATED RINGERS IV SOLN: INTRAVENOUS | Status: DC | PRN

## 2022-08-24 MED ORDER — ACETAMINOPHEN 500 MG PO TABS: 1000.0000 mg | ORAL_TABLET | ORAL | Status: AC

## 2022-08-24 MED ORDER — ACETAMINOPHEN 500 MG PO TABS: 1000.0000 mg | ORAL_TABLET | Freq: Once | ORAL | Status: AC

## 2022-08-24 MED ORDER — HYDROMORPHONE HCL 1 MG/ML IJ SOLN
INTRAMUSCULAR | Status: AC
  Filled 2022-08-24: qty 1

## 2022-08-24 MED ORDER — THROMBIN 5000 UNITS EX SOLR
CUTANEOUS | Status: AC
  Filled 2022-08-24: qty 5000

## 2022-08-24 MED ORDER — CITALOPRAM HYDROBROMIDE 20 MG PO TABS
40.0000 mg | ORAL_TABLET | Freq: Every day | ORAL | Status: DC
  Administered 2022-08-24 – 2022-08-25 (×2): 40 mg via ORAL
  Filled 2022-08-24 (×2): qty 2

## 2022-08-24 MED ORDER — SODIUM CHLORIDE 0.9% FLUSH: 3.0000 mL | INTRAVENOUS | Status: DC | PRN

## 2022-08-24 MED ORDER — FENTANYL CITRATE (PF) 100 MCG/2ML IJ SOLN
25.0000 ug | INTRAMUSCULAR | Status: DC | PRN
  Administered 2022-08-24 (×3): 50 ug via INTRAVENOUS

## 2022-08-24 MED ORDER — BUPIVACAINE HCL (PF) 0.25 % IJ SOLN
INTRAMUSCULAR | Status: DC | PRN
  Administered 2022-08-24: 8 mL

## 2022-08-24 MED ORDER — BUPROPION HCL ER (XL) 150 MG PO TB24
150.0000 mg | ORAL_TABLET | Freq: Every day | ORAL | Status: DC
  Administered 2022-08-25 – 2022-08-26 (×2): 150 mg via ORAL
  Filled 2022-08-24 (×2): qty 1

## 2022-08-24 MED ORDER — SODIUM CHLORIDE 0.9% FLUSH
3.0000 mL | Freq: Two times a day (BID) | INTRAVENOUS | Status: DC
  Administered 2022-08-24 – 2022-08-26 (×3): 3 mL via INTRAVENOUS

## 2022-08-24 MED ORDER — PHENYLEPHRINE 80 MCG/ML (10ML) SYRINGE FOR IV PUSH (FOR BLOOD PRESSURE SUPPORT)
PREFILLED_SYRINGE | INTRAVENOUS | Status: AC
  Filled 2022-08-24: qty 10

## 2022-08-24 MED ORDER — OXYCODONE HCL 5 MG PO TABS
10.0000 mg | ORAL_TABLET | ORAL | Status: DC | PRN
  Administered 2022-08-25 – 2022-08-26 (×5): 10 mg via ORAL
  Filled 2022-08-24 (×6): qty 2

## 2022-08-24 MED ORDER — ACETAMINOPHEN 10 MG/ML IV SOLN
INTRAVENOUS | Status: AC
  Filled 2022-08-24: qty 100

## 2022-08-24 MED ORDER — CHLORHEXIDINE GLUCONATE 0.12 % MT SOLN: 15.0000 mL | Freq: Once | OROMUCOSAL | Status: AC

## 2022-08-24 MED ORDER — LACTATED RINGERS IV SOLN: INTRAVENOUS | Status: DC

## 2022-08-24 MED ORDER — KETAMINE HCL 50 MG/5ML IJ SOSY
PREFILLED_SYRINGE | INTRAMUSCULAR | Status: AC
  Filled 2022-08-24: qty 5

## 2022-08-24 MED ORDER — MIDAZOLAM HCL 2 MG/2ML IJ SOLN
INTRAMUSCULAR | Status: DC | PRN
  Administered 2022-08-24: 2 mg via INTRAVENOUS

## 2022-08-24 MED ORDER — MIDAZOLAM HCL 2 MG/2ML IJ SOLN
INTRAMUSCULAR | Status: AC
  Filled 2022-08-24: qty 2

## 2022-08-24 MED ORDER — ONDANSETRON HCL 4 MG/2ML IJ SOLN
INTRAMUSCULAR | Status: DC | PRN
  Administered 2022-08-24: 4 mg via INTRAVENOUS

## 2022-08-24 MED ORDER — KETOROLAC TROMETHAMINE 15 MG/ML IJ SOLN
15.0000 mg | Freq: Four times a day (QID) | INTRAMUSCULAR | Status: AC
  Administered 2022-08-24 – 2022-08-25 (×3): 15 mg via INTRAVENOUS
  Filled 2022-08-24 (×4): qty 1

## 2022-08-24 MED ORDER — 0.9 % SODIUM CHLORIDE (POUR BTL) OPTIME
TOPICAL | Status: DC | PRN
  Administered 2022-08-24: 1000 mL

## 2022-08-24 MED ORDER — SUGAMMADEX SODIUM 200 MG/2ML IV SOLN
INTRAVENOUS | Status: DC | PRN
  Administered 2022-08-24: 200 mg via INTRAVENOUS

## 2022-08-24 MED ORDER — THROMBIN 5000 UNITS EX SOLR
OROMUCOSAL | Status: DC | PRN
  Administered 2022-08-24 (×3): 5 mL via TOPICAL

## 2022-08-24 MED ORDER — ROCURONIUM BROMIDE 10 MG/ML (PF) SYRINGE
PREFILLED_SYRINGE | INTRAVENOUS | Status: AC
  Filled 2022-08-24: qty 10

## 2022-08-24 MED ORDER — HYDROMORPHONE HCL 1 MG/ML IJ SOLN
0.5000 mg | INTRAMUSCULAR | Status: DC | PRN
  Administered 2022-08-25 – 2022-08-26 (×4): 0.5 mg via INTRAVENOUS
  Filled 2022-08-24 (×4): qty 0.5

## 2022-08-24 MED ORDER — PROMETHAZINE HCL 25 MG/ML IJ SOLN: 6.2500 mg | INTRAMUSCULAR | Status: DC | PRN

## 2022-08-24 MED ORDER — ACETAMINOPHEN 500 MG PO TABS
ORAL_TABLET | ORAL | Status: AC
  Administered 2022-08-24: 1000 mg via ORAL
  Filled 2022-08-24: qty 2

## 2022-08-24 MED ORDER — CEFAZOLIN SODIUM-DEXTROSE 2-4 GM/100ML-% IV SOLN
2.0000 g | INTRAVENOUS | Status: AC
  Administered 2022-08-24: 2 g via INTRAVENOUS

## 2022-08-24 MED ORDER — FENTANYL CITRATE (PF) 100 MCG/2ML IJ SOLN
INTRAMUSCULAR | Status: AC
  Filled 2022-08-24: qty 2

## 2022-08-24 MED ORDER — EPHEDRINE 5 MG/ML INJ
INTRAVENOUS | Status: AC
  Filled 2022-08-24: qty 5

## 2022-08-24 MED ORDER — PROPOFOL 10 MG/ML IV BOLUS
INTRAVENOUS | Status: DC | PRN
  Administered 2022-08-24: 140 mg via INTRAVENOUS

## 2022-08-24 MED ORDER — SUFENTANIL CITRATE 50 MCG/ML IV SOLN
INTRAVENOUS | Status: AC
  Filled 2022-08-24: qty 1

## 2022-08-24 MED ORDER — KETAMINE HCL 10 MG/ML IJ SOLN
INTRAMUSCULAR | Status: DC | PRN
  Administered 2022-08-24 (×2): 20 mg via INTRAVENOUS
  Administered 2022-08-24: 10 mg via INTRAVENOUS

## 2022-08-24 MED ORDER — PROPOFOL 10 MG/ML IV BOLUS
INTRAVENOUS | Status: AC
  Filled 2022-08-24: qty 20

## 2022-08-24 MED ORDER — ACETAMINOPHEN 10 MG/ML IV SOLN
1000.0000 mg | Freq: Once | INTRAVENOUS | Status: AC
  Administered 2022-08-24: 1000 mg via INTRAVENOUS

## 2022-08-24 MED ORDER — ONDANSETRON HCL 4 MG/2ML IJ SOLN
INTRAMUSCULAR | Status: AC
  Filled 2022-08-24: qty 2

## 2022-08-24 MED ORDER — HYDROMORPHONE HCL 1 MG/ML IJ SOLN
0.2500 mg | INTRAMUSCULAR | Status: DC | PRN
  Administered 2022-08-24 (×2): 0.5 mg via INTRAVENOUS

## 2022-08-24 MED ORDER — ADULT MULTIVITAMIN W/MINERALS CH
1.0000 | ORAL_TABLET | Freq: Every day | ORAL | Status: DC
  Administered 2022-08-24 – 2022-08-25 (×2): 1 via ORAL
  Filled 2022-08-24 (×2): qty 1

## 2022-08-24 MED ORDER — EPHEDRINE SULFATE (PRESSORS) 50 MG/ML IJ SOLN
INTRAMUSCULAR | Status: DC | PRN
  Administered 2022-08-24: 10 mg via INTRAVENOUS
  Administered 2022-08-24: 15 mg via INTRAVENOUS
  Administered 2022-08-24: 10 mg via INTRAVENOUS

## 2022-08-24 MED ORDER — PANTOPRAZOLE SODIUM 40 MG PO TBEC
40.0000 mg | DELAYED_RELEASE_TABLET | Freq: Every day | ORAL | Status: DC
  Administered 2022-08-25 – 2022-08-26 (×2): 40 mg via ORAL
  Filled 2022-08-24 (×2): qty 1

## 2022-08-24 MED ORDER — METHOCARBAMOL 500 MG PO TABS
ORAL_TABLET | ORAL | Status: AC
  Filled 2022-08-24: qty 1

## 2022-08-24 MED ORDER — ALBUMIN HUMAN 5 % IV SOLN: INTRAVENOUS | Status: DC | PRN

## 2022-08-24 MED ORDER — LIDOCAINE 2% (20 MG/ML) 5 ML SYRINGE
INTRAMUSCULAR | Status: AC
  Filled 2022-08-24: qty 5

## 2022-08-24 MED ORDER — ACETAMINOPHEN 500 MG PO TABS
1000.0000 mg | ORAL_TABLET | Freq: Four times a day (QID) | ORAL | Status: AC
  Administered 2022-08-24 – 2022-08-25 (×3): 1000 mg via ORAL
  Filled 2022-08-24 (×4): qty 2

## 2022-08-24 MED ORDER — DOCUSATE SODIUM 100 MG PO CAPS
100.0000 mg | ORAL_CAPSULE | Freq: Every day | ORAL | Status: DC
  Administered 2022-08-25 – 2022-08-26 (×2): 100 mg via ORAL
  Filled 2022-08-24 (×2): qty 1

## 2022-08-24 MED ORDER — FLUTICASONE PROPIONATE 50 MCG/ACT NA SUSP: 1.0000 | Freq: Every day | NASAL | Status: DC | PRN

## 2022-08-24 MED ORDER — CHLORHEXIDINE GLUCONATE 0.12 % MT SOLN
OROMUCOSAL | Status: AC
  Administered 2022-08-24: 15 mL via OROMUCOSAL
  Filled 2022-08-24: qty 15

## 2022-08-24 MED ORDER — METHOCARBAMOL 500 MG PO TABS
500.0000 mg | ORAL_TABLET | Freq: Once | ORAL | Status: AC
  Administered 2022-08-24: 500 mg via ORAL

## 2022-08-24 MED ORDER — CLONAZEPAM 1 MG PO TABS
1.0000 mg | ORAL_TABLET | Freq: Every day | ORAL | Status: DC
  Administered 2022-08-24 – 2022-08-25 (×2): 1 mg via ORAL
  Filled 2022-08-24 (×2): qty 1

## 2022-08-24 MED ORDER — METHOCARBAMOL 500 MG PO TABS
500.0000 mg | ORAL_TABLET | Freq: Four times a day (QID) | ORAL | Status: DC | PRN
  Administered 2022-08-25 – 2022-08-26 (×5): 500 mg via ORAL
  Filled 2022-08-24 (×5): qty 1

## 2022-08-24 MED ORDER — CHLORHEXIDINE GLUCONATE CLOTH 2 % EX PADS: 6.0000 | MEDICATED_PAD | Freq: Once | CUTANEOUS | Status: DC

## 2022-08-24 MED ORDER — THROMBIN 20000 UNITS EX SOLR
CUTANEOUS | Status: DC | PRN
  Administered 2022-08-24: 20 mL via TOPICAL

## 2022-08-24 MED ORDER — POTASSIUM CHLORIDE IN NACL 20-0.9 MEQ/L-% IV SOLN: INTRAVENOUS | Status: DC

## 2022-08-24 MED ORDER — THROMBIN 20000 UNITS EX SOLR
CUTANEOUS | Status: AC
  Filled 2022-08-24: qty 20000

## 2022-08-24 MED ORDER — FERROUS SULFATE 325 (65 FE) MG PO TABS
325.0000 mg | ORAL_TABLET | Freq: Every day | ORAL | Status: DC
  Administered 2022-08-25 – 2022-08-26 (×2): 325 mg via ORAL
  Filled 2022-08-24 (×2): qty 1

## 2022-08-24 MED ORDER — PRAMIPEXOLE DIHYDROCHLORIDE 0.25 MG PO TABS
0.5000 mg | ORAL_TABLET | Freq: Every day | ORAL | Status: DC | PRN
  Administered 2022-08-25: 0.5 mg via ORAL
  Filled 2022-08-24 (×2): qty 2

## 2022-08-24 MED ORDER — METHOCARBAMOL 1000 MG/10ML IJ SOLN: 500.0000 mg | Freq: Four times a day (QID) | INTRAVENOUS | Status: DC | PRN

## 2022-08-24 MED ORDER — CEFAZOLIN SODIUM-DEXTROSE 2-4 GM/100ML-% IV SOLN
INTRAVENOUS | Status: AC
  Filled 2022-08-24: qty 100

## 2022-08-24 MED ORDER — ROCURONIUM BROMIDE 10 MG/ML (PF) SYRINGE
PREFILLED_SYRINGE | INTRAVENOUS | Status: DC | PRN
  Administered 2022-08-24: 100 mg via INTRAVENOUS

## 2022-08-24 MED ORDER — ONDANSETRON HCL 4 MG PO TABS: 4.0000 mg | ORAL_TABLET | Freq: Four times a day (QID) | ORAL | Status: DC | PRN

## 2022-08-24 MED ORDER — ADHERUS DURAL SEALANT
PACK | TOPICAL | Status: DC | PRN
  Administered 2022-08-24: 1 via TOPICAL

## 2022-08-24 MED ORDER — CEFAZOLIN SODIUM-DEXTROSE 2-4 GM/100ML-% IV SOLN
2.0000 g | Freq: Three times a day (TID) | INTRAVENOUS | Status: AC
  Administered 2022-08-24 – 2022-08-25 (×2): 2 g via INTRAVENOUS
  Filled 2022-08-24 (×2): qty 100

## 2022-08-24 MED ORDER — NALOXONE HCL 0.4 MG/ML IJ SOLN
INTRAMUSCULAR | Status: AC
  Filled 2022-08-24: qty 1

## 2022-08-24 MED ORDER — HYDROMORPHONE HCL 1 MG/ML IJ SOLN
0.2500 mg | INTRAMUSCULAR | Status: DC | PRN
  Administered 2022-08-24 (×4): 0.5 mg via INTRAVENOUS

## 2022-08-24 MED ORDER — ONDANSETRON HCL 4 MG/2ML IJ SOLN
4.0000 mg | Freq: Four times a day (QID) | INTRAMUSCULAR | Status: DC | PRN
  Administered 2022-08-26 (×2): 4 mg via INTRAVENOUS
  Filled 2022-08-24: qty 2

## 2022-08-24 MED ORDER — PHENYLEPHRINE HCL-NACL 20-0.9 MG/250ML-% IV SOLN
INTRAVENOUS | Status: DC | PRN
  Administered 2022-08-24: 35 ug/min via INTRAVENOUS

## 2022-08-24 MED ORDER — BUPIVACAINE HCL (PF) 0.25 % IJ SOLN
INTRAMUSCULAR | Status: AC
  Filled 2022-08-24: qty 30

## 2022-08-24 MED ORDER — ORAL CARE MOUTH RINSE: 15.0000 mL | Freq: Once | OROMUCOSAL | Status: AC

## 2022-08-24 MED ORDER — LIDOCAINE 2% (20 MG/ML) 5 ML SYRINGE
INTRAMUSCULAR | Status: DC | PRN
  Administered 2022-08-24: 100 mg via INTRAVENOUS

## 2022-08-24 MED ORDER — PHENOL 1.4 % MT LIQD: 1.0000 | OROMUCOSAL | Status: DC | PRN

## 2022-08-24 MED ORDER — MENTHOL 3 MG MT LOZG: 1.0000 | LOZENGE | OROMUCOSAL | Status: DC | PRN

## 2022-08-24 MED ORDER — GABAPENTIN 300 MG PO CAPS
ORAL_CAPSULE | ORAL | Status: AC
  Administered 2022-08-24: 300 mg via ORAL
  Filled 2022-08-24: qty 1

## 2022-08-24 MED ORDER — GABAPENTIN 300 MG PO CAPS: 300.0000 mg | ORAL_CAPSULE | ORAL | Status: AC

## 2022-08-24 MED ORDER — SODIUM CHLORIDE 0.9 % IV SOLN
250.0000 mL | INTRAVENOUS | Status: DC
  Administered 2022-08-24: 250 mL via INTRAVENOUS

## 2022-08-24 MED ORDER — SUFENTANIL CITRATE 50 MCG/ML IV SOLN
INTRAVENOUS | Status: DC | PRN
  Administered 2022-08-24 (×4): 10 ug via INTRAVENOUS

## 2022-08-24 SURGICAL SUPPLY — 69 items
BAG COUNTER SPONGE SURGICOUNT (BAG) ×1 IMPLANT
BASKET BONE COLLECTION (BASKET) ×1 IMPLANT
BENZOIN TINCTURE PRP APPL 2/3 (GAUZE/BANDAGES/DRESSINGS) ×1 IMPLANT
BLADE BONE MILL MEDIUM (MISCELLANEOUS) ×1 IMPLANT
BLADE CLIPPER SURG (BLADE) IMPLANT
BUR CARBIDE MATCH 3.0 (BURR) ×1 IMPLANT
CANISTER SUCT 3000ML PPV (MISCELLANEOUS) ×1 IMPLANT
CLSR STERI-STRIP ANTIMIC 1/2X4 (GAUZE/BANDAGES/DRESSINGS) IMPLANT
CNTNR URN SCR LID CUP LEK RST (MISCELLANEOUS) ×1 IMPLANT
CONT SPEC 4OZ STRL OR WHT (MISCELLANEOUS) ×1
COVER BACK TABLE 60X90IN (DRAPES) ×1 IMPLANT
DERMABOND ADVANCED .7 DNX12 (GAUZE/BANDAGES/DRESSINGS) ×1 IMPLANT
DERMABOND ADVANCED .7 DNX6 (GAUZE/BANDAGES/DRESSINGS) IMPLANT
DRAPE C-ARM 42X72 X-RAY (DRAPES) ×2 IMPLANT
DRAPE C-ARMOR (DRAPES) ×1 IMPLANT
DRAPE LAPAROTOMY 100X72X124 (DRAPES) ×1 IMPLANT
DRAPE SURG 17X23 STRL (DRAPES) ×1 IMPLANT
DRSG OPSITE POSTOP 4X6 (GAUZE/BANDAGES/DRESSINGS) IMPLANT
DRSG OPSITE POSTOP 4X8 (GAUZE/BANDAGES/DRESSINGS) IMPLANT
DURAPREP 26ML APPLICATOR (WOUND CARE) ×1 IMPLANT
ELECT REM PT RETURN 9FT ADLT (ELECTROSURGICAL) ×1
ELECTRODE REM PT RTRN 9FT ADLT (ELECTROSURGICAL) ×1 IMPLANT
EVACUATOR 1/8 PVC DRAIN (DRAIN) ×1 IMPLANT
GAUZE 4X4 16PLY ~~LOC~~+RFID DBL (SPONGE) IMPLANT
GLOVE BIO SURGEON STRL SZ7 (GLOVE) IMPLANT
GLOVE BIO SURGEON STRL SZ8 (GLOVE) ×2 IMPLANT
GLOVE BIOGEL PI IND STRL 7.0 (GLOVE) IMPLANT
GOWN STRL REUS W/ TWL LRG LVL3 (GOWN DISPOSABLE) IMPLANT
GOWN STRL REUS W/ TWL XL LVL3 (GOWN DISPOSABLE) ×2 IMPLANT
GOWN STRL REUS W/TWL 2XL LVL3 (GOWN DISPOSABLE) IMPLANT
GOWN STRL REUS W/TWL LRG LVL3 (GOWN DISPOSABLE) ×3
GOWN STRL REUS W/TWL XL LVL3 (GOWN DISPOSABLE) ×2
GRAFT BONE PROTEIOS XL 10CC (Orthopedic Implant) IMPLANT
HEMOSTAT POWDER KIT SURGIFOAM (HEMOSTASIS) ×1 IMPLANT
KIT BASIN OR (CUSTOM PROCEDURE TRAY) ×1 IMPLANT
KIT GRAFTMAG DEL NEURO DISP (NEUROSURGERY SUPPLIES) IMPLANT
KIT TURNOVER KIT B (KITS) ×1 IMPLANT
MATRIX STRIP NEOCORE 12C (Putty) IMPLANT
MILL BONE PREP (MISCELLANEOUS) ×1 IMPLANT
NDL HYPO 25X1 1.5 SAFETY (NEEDLE) ×1 IMPLANT
NEEDLE HYPO 25X1 1.5 SAFETY (NEEDLE) ×1 IMPLANT
NS IRRIG 1000ML POUR BTL (IV SOLUTION) ×1 IMPLANT
PACK LAMINECTOMY NEURO (CUSTOM PROCEDURE TRAY) ×1 IMPLANT
PAD ARMBOARD 7.5X6 YLW CONV (MISCELLANEOUS) ×3 IMPLANT
PATTIES SURGICAL .5 X.5 (GAUZE/BANDAGES/DRESSINGS) IMPLANT
PATTIES SURGICAL 1X1 (DISPOSABLE) IMPLANT
ROD LORD LIPPED TI 5.5X75 (Rod) IMPLANT
SCREW ILIAC PA 5.5X40 (Screw) IMPLANT
SCREW POLYAXIAL TULIP (Screw) IMPLANT
SCREW SHANK MOD 5.5X40 (Screw) IMPLANT
SEALANT ADHERUS EXTEND TIP (MISCELLANEOUS) IMPLANT
SET SCREW (Screw) ×6 IMPLANT
SET SCREW SPNE (Screw) IMPLANT
SPACER IDENTITI PS 5D 8X9X25 (Spacer) IMPLANT
SPACER IDENTITI PS 9X9X25 15D (Spacer) IMPLANT
SPONGE SURGIFOAM ABS GEL 100 (HEMOSTASIS) ×1 IMPLANT
SPONGE T-LAP 4X18 ~~LOC~~+RFID (SPONGE) IMPLANT
STRIP CLOSURE SKIN 1/2X4 (GAUZE/BANDAGES/DRESSINGS) ×2 IMPLANT
STRIP MATRIX NEOCORE 12CC (Putty) ×1 IMPLANT
SUT PROLENE 6 0 BV (SUTURE) IMPLANT
SUT VIC AB 0 CT1 18XCR BRD8 (SUTURE) ×1 IMPLANT
SUT VIC AB 0 CT1 8-18 (SUTURE) ×1
SUT VIC AB 2-0 CP2 18 (SUTURE) ×1 IMPLANT
SUT VIC AB 3-0 SH 8-18 (SUTURE) ×2 IMPLANT
SYR CONTROL 10ML LL (SYRINGE) ×1 IMPLANT
TOWEL GREEN STERILE (TOWEL DISPOSABLE) ×1 IMPLANT
TOWEL GREEN STERILE FF (TOWEL DISPOSABLE) ×1 IMPLANT
TRAY FOLEY MTR SLVR 16FR STAT (SET/KITS/TRAYS/PACK) ×1 IMPLANT
WATER STERILE IRR 1000ML POUR (IV SOLUTION) ×1 IMPLANT

## 2022-08-24 NOTE — Op Note (Signed)
08/24/2022  3:41 PM  PATIENT:  Alexis Fox  66 y.o. 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 Decompressive lumbar laminectomy, hemi facetectomy and foraminotomies L2-3 and L3-4 requiring more work than would be required for a simple exposure of the disk for PLIF in order to adequately decompress the neural elements and address the spinal stenosis 2. Posterior lumbar interbody fusion L2-3 and L3-4 using porous titanium interbody cages packed with morcellized allograft and autograft  3. Posterior fixation L2-L4 inclusive using Alphatec cortical pedicle screws.  4. Intertransverse arthrodesis L2-L4 using morcellized autograft and allograft. 5.  Removal of nonsegmental fixation L4-5  SURGEON:  Sherley Bounds, MD  ASSISTANTS: Glenford Peers FNP  ANESTHESIA:  General  EBL: 600 ml  Total I/O In: 1500 [I.V.:1000; IV [id number]:710] Out: 1200 [Urine:600; Blood:600]  BLOOD ADMINISTERED:none  DRAINS: none   INDICATION FOR PROCEDURE: This patient presented with back pain and leg pain. Imaging revealed retrolisthesis L2-3 with spinal stenosis L2-L3 4 above previous successful L4-5 fusion. The patient tried a reasonable attempt at conservative medical measures without relief. I recommended decompression and instrumented fusion to address the stenosis as well as the segmental  instability.  Patient understood the risks, benefits, and alternatives and potential outcomes and wished to proceed.  PROCEDURE DETAILS:  The patient was brought to the operating room. After induction of generalized endotracheal anesthesia the patient was rolled into the prone position on chest rolls and all pressure points were padded. The patient's lumbar region was cleaned and then prepped with DuraPrep and draped in the usual sterile fashion. Anesthesia was injected and then a  dorsal midline incision was made and carried down to the lumbosacral fascia. The fascia was opened and the paraspinous musculature was taken down in a subperiosteal fashion to expose L2-3 and L3-4 as well as the previously placed instrumentation. A self-retaining retractor was placed. Intraoperative fluoroscopy confirmed my level, and I started with removal of the previously placed instrumentation.  The locking caps were removed at L4 and L5 and the rods were removed.  The screws had good purchase.  We then turned our attention to placement of the L2 cortical pedicle screws. The pedicle screw entry zones were identified utilizing surface landmarks and  AP and lateral fluoroscopy. I scored the cortex with the high-speed drill and then used the hand drill to drill an upward and outward direction into the pedicle. I then tapped line to line. I then placed a 5.5 x 40 mm cortical pedicle screw into the pedicles of L2 bilaterally.    I then turned my attention to the decompression and complete lumbar laminectomies, hemi- facetectomies, and foraminotomies were performed at L2-3 and L3-4.  My nurse practitioner was directly involved in the decompression and exposure of the neural elements. the patient had significant spinal stenosis and this required more work than would be required for a simple exposure of the disc for posterior lumbar interbody fusion which would only require a limited laminotomy. Much more generous decompression and generous foraminotomy was undertaken in order to adequately decompress the neural elements and address the patient's leg pain. The yellow ligament was removed to expose the underlying dura and nerve roots, and generous foraminotomies were performed to adequately decompress the neural elements. Both the exiting and traversing nerve roots were decompressed on both sides until a coronary dilator passed easily along the nerve roots.  We created a small  unintended durotomy at L2-3 on the left  and this was repaired primarily with a running 6-0 Prolene suture.  There was no evidence of CSF leak after this.  Once the decompression was complete, I turned my attention to the posterior lower lumbar interbody fusion. The epidural venous vasculature was coagulated and cut sharply. Disc space was incised and the initial discectomy was performed with pituitary rongeurs. The disc space was distracted with sequential distractors to a height of 8 mm at L2-3 and 9 mm at L3-4. We then used a series of scrapers and shavers to prepare the endplates for fusion. The midline was prepared with Epstein curettes. Once the complete discectomy was finished, we packed an appropriate sized interbody cage with local autograft and morcellized allograft, gently retracted the nerve root, and tapped the cage into position at L2-3 and L3-4.  The midline between the cages was packed with morselized autograft and allograft.   We then turned our attention to the placement of the lower pedicle screws. The pedicle screw entry zones were identified utilizing surface landmarks and fluoroscopy. I drilled into each pedicle utilizing the hand drill, and tapped each pedicle with the appropriate tap. We palpated with a ball probe to assure no break in the cortex. We then placed a 5.5 x 40 mm pedicle screws into the pedicles bilaterally at L3.  My nurse practitioner assisted in placement of the pedicle screws.  We then decorticated the transverse processes and laid a mixture of morcellized autograft and allograft out over these to perform intertransverse arthrodesis at L2-L4. We then placed lordotic rods into the multiaxial screw heads of the pedicle screws of L2, L3 and L4 bilaterally and locked these in position with the locking caps and anti-torque device. We then checked our construct with AP and lateral fluoroscopy.  We left the L5 pedicle screws in place in case there were ever needed for a longer construct.  Irrigated with copious amounts  of bacitracin-containing saline solution. Inspected the nerve roots once again to assure adequate decompression, lined to the dura with Gelfoam,  and then we closed the muscle and the fascia with 0 Vicryl. Closed the subcutaneous tissues with 2-0 Vicryl and subcuticular tissues with 3-0 Vicryl. The skin was closed with benzoin and Steri-Strips. Dressing was then applied, the patient was awakened from general anesthesia and transported to the recovery room in stable condition. At the end of the procedure all sponge, needle and instrument counts were correct.   PLAN OF CARE: admit to inpatient  PATIENT DISPOSITION:  PACU - hemodynamically stable.   Delay start of Pharmacological VTE agent (>24hrs) due to surgical blood loss or risk of bleeding:  yes

## 2022-08-24 NOTE — Transfer of Care (Signed)
Immediate Anesthesia Transfer of Care Note  Patient: Alexis Fox  Procedure(s) Performed: Posterior Lumbar Interbody Fusion - Lumbar two -Lumbar three - Lumbar three-Lumbar four, removal of instrumentation Lumbar four-five (Back)  Patient Location: PACU  Anesthesia Type:General  Level of Consciousness: oriented, drowsy, patient cooperative and responds to stimulation  Airway & Oxygen Therapy: Patient Spontanous Breathing and Patient connected to nasal cannula oxygen  Post-op Assessment: Report given to RN, Post -op Vital signs reviewed and stable and Patient moving all extremities X 4  Post vital signs: Reviewed and stable  Last Vitals:  Vitals Value Taken Time  BP 134/70 08/24/22 1604  Temp    Pulse 75 08/24/22 1609  Resp 18 08/24/22 1609  SpO2 94 % 08/24/22 1609  Vitals shown include unvalidated device data.  Last Pain:  Vitals:   08/24/22 0924  TempSrc:   PainSc: 3       Patients Stated Pain Goal: 3 (08/81/10 3159)  Complications: No notable events documented.

## 2022-08-24 NOTE — H&P (Signed)
Subjective: Patient is a 66 y.o. female admitted for severe back and leg pain. Onset of symptoms was several months ago, gradually worsening since that time.  The pain is rated severe, and is located at the across the lower back and radiates to legs. The pain is described as aching and occurs all day. The symptoms have been progressive. Symptoms are exacerbated by exercise, standing, and walking for more than a few minutes. MRI or CT showed adjacent level stenosis and spondylosis L2-L3 4 above previous L4-5 fusion  Past Medical History:  Diagnosis Date   Anxiety    Arthritis    knees   Breast cancer ([place])    Breast cancer of upper-outer quadrant of left female breast ([place]) 03/20/2016   C2 cervical fracture ([place])    10/22/18, following fall from ladder   Cervicalgia    Cholecystitis    Complication of anesthesia    BP "crashes" post op   Depression    Early cataracts, bilateral    Family history of brain cancer    Family history of breast cancer    GERD (gastroesophageal reflux disease)    Headache    " from my neck"   Hot flashes    Personal history of chemotherapy    Personal history of radiation therapy    Restless leg syndrome    Sleep apnea 10/03/2018   wears CPAP    Past Surgical History:  Procedure Laterality Date   ANTERIOR CERVICAL DECOMP/DISCECTOMY FUSION N/A 01/22/2020   Procedure: ACDF - C2-C3;  Surgeon: Eustace Moore, MD;  Location: [facility];  Service: Neurosurgery;  Laterality: N/A;   BACK SURGERY     x2   BREAST BIOPSY     BREAST LUMPECTOMY Left 2018   BUNIONECTOMY     CHOLECYSTECTOMY N/A 10/04/2015   Procedure: LAPAROSCOPIC CHOLECYSTECTOMY WITH INTRAOPERATIVE CHOLANGIOGRAM;  Surgeon: Greer Pickerel, MD;  Location: [facility];  Service: General;  Laterality: N/A;   ELBOW SURGERY     right for epicondylitis 2010    ESOPHAGEAL MANOMETRY N/A 11/30/2020   Procedure: ESOPHAGEAL MANOMETRY (EM);  Surgeon: Ronnette Juniper, MD;  Location: [facility];  Service: Gastroenterology;   Laterality: N/A;   FOOT SURGERY     1983 -tarsal tunnel release   IR GENERIC HISTORICAL  10/26/2016   IR CV LINE INJECTION 10/26/2016 [facility]   LUMBAR DISC SURGERY  04/04/2012   MASS EXCISION Left 02/06/2021   Procedure: EXCISION LEFT FOOT SOFT TISSUE BETWEEN SECOND AND THIRD AND THIRD AND FOURTH TOES;  Surgeon: Landis Martins, DPM;  Location: [facility];  Service: Podiatry;  Laterality: Left;   PORT-A-CATH REMOVAL N/A 11/15/2016   Procedure: REMOVAL PORT-A-CATH;  Surgeon: Rolm Bookbinder, MD;  Location: [facility];  Service: General;  Laterality: N/A;   PORTACATH PLACEMENT Right 04/02/2016   Procedure: INSERTION PORT-A-CATH WITH [place];  Surgeon: Rolm Bookbinder, MD;  Location: [facility];  Service: General;  Laterality: Right;   RADIOACTIVE SEED GUIDED PARTIAL MASTECTOMY/AXILLARY SENTINEL NODE BIOPSY/AXILLARY NODE DISSECTION Left 09/12/2016   Procedure: LEFT BREAST SEED GUIDED LUMPECTOMY, LEFT AXILLARY SENTINEL NODE BIOPSY, LEFT SEED GUIDED AXILLARY NODE EXCISION( TARGETED AXILLARY DISSECTION), BLUE DYE INJECTION;  Surgeon: Rolm Bookbinder, MD;  Location: [facility];  Service: General;  Laterality: Left;   SACROILIAC JOINT FUSION Left 05/18/2020   Procedure: LEFT SACROILIAC JOINT FUSION;  Surgeon: Phylliss Bob, MD;  Location: [facility];  Service: Orthopedics;  Laterality: Left;   SPINAL FUSION  5/12   L5-S1  SPINAL FUSION  05/18/2019   L4-5   TARSAL TUNNEL RELEASE      Prior to Admission medications   Medication Sig Start Date End Date Taking? Authorizing Provider  Ascorbic Acid (VITAMIN C) 500 MG CAPS Take 500 mg by mouth daily.   Yes [provider]  buPROPion (WELLBUTRIN XL) 150 MG 24 hr tablet Take 150 mg by mouth daily. 03/06/16  Yes [provider]  cholecalciferol (VITAMIN D3) 25 MCG (1000 UNIT) tablet Take 1,000 Units by mouth in the morning and at bedtime.   Yes [provider]  citalopram  (CELEXA) 40 MG tablet Take 40 mg by mouth at bedtime.  12/21/15  Yes [provider]  clobetasol ointment (TEMOVATE) 9.37 % Apply 1 application topically 2 (two) times daily. Patient taking differently: Apply 1 application  topically 2 (two) times daily as needed (irritation). 11/28/20  Yes Marny Lowenstein A, NP  clonazePAM (KLONOPIN) 1 MG tablet Take 1 mg by mouth at bedtime.    Yes [provider]  docusate sodium (COLACE) 100 MG capsule Take 100 mg by mouth daily.   Yes [provider]  esomeprazole (NEXIUM) 40 MG capsule Take 40 mg by mouth 2 (two) times daily.   Yes [provider]  Ferrous Sulfate (IRON) 325 (65 Fe) MG TABS Take 325 mg by mouth daily.   Yes [provider]  fluticasone (FLONASE) 50 MCG/ACT nasal spray Place 1 spray into both nostrils daily as needed for allergies. 03/08/09  Yes [provider]  Multiple Vitamins-Minerals (MULTIVITAMIN WITH MINERALS) tablet Take 1 tablet by mouth at bedtime.   Yes [provider]  pramipexole (MIRAPEX) 0.5 MG tablet TAKE 1 TABLET (0.5 MG TOTAL) BY MOUTH DAILY AS NEEDED (TAKE ALONG WITH EXTENDED RELEASE DOSE) Patient taking differently: Take 0.5 mg by mouth See admin instructions. 0.5 mg in the afternoon as needed, 0.5 mg at bedtime 02/21/22  Yes Ward Givens, NP  Pramipexole Dihydrochloride (MIRAPEX ER) 0.75 MG TB24 Take 1 tablet (0.75 mg total) by mouth daily. Patient taking differently: Take 0.75 mg by mouth as needed. 01/23/22  Yes Ward Givens, NP   Allergies  Allergen Reactions   Turmeric Diarrhea    Severe diarrhea   Decadron [Dexamethasone]     Exacerbates restless leg    Social History   Tobacco Use   Smoking status: Never   Smokeless tobacco: Never  Substance Use Topics   Alcohol use: Yes    Alcohol/week: 1.0 standard drink of alcohol    Types: 1 Cans of beer per week    Comment: occasional    Family History  Problem Relation Age of Onset   Heart disease  Mother    Pulmonary fibrosis Mother    Hypertension Mother    Cancer Father 9       astocytoma   Hypertension Brother    Lymphoma Maternal Aunt    Anesthesia problems Neg Hx      Review of Systems  Positive ROS: neg  All other systems have been reviewed and were otherwise negative with the exception of those mentioned in the HPI and as above.  Objective: Vital signs in last 24 hours: Temp:  [98.1 F (36.7 C)] 98.1 F (36.7 C) (09/22 0913) Pulse Rate:  [77] 77 (09/22 0913) Resp:  [17] 17 (09/22 0913) BP: (131)/(70) 131/70 (09/22 0913) SpO2:  [97 %] 97 % (09/22 0913) Weight:  [98 kg] 98 kg (09/22 0913)  General Appearance: Alert, cooperative, no distress, appears stated age  Head: Normocephalic, without obvious abnormality, atraumatic Eyes: PERRL, conjunctiva/corneas clear, EOM's intact    Neck: Supple, symmetrical, trachea midline Back: Symmetric, no curvature, ROM normal, no CVA tenderness Lungs:  respirations unlabored Heart: Regular rate and rhythm Abdomen: Soft, non-tender Extremities: Extremities normal, atraumatic, no cyanosis or edema Pulses: 2+ and symmetric all extremities Skin: Skin color, texture, turgor normal, no rashes or lesions  NEUROLOGIC:   Mental status: Alert and oriented x4,  no aphasia, good attention span, fund of knowledge, and memory Motor Exam - grossly normal Sensory Exam - grossly normal Reflexes: 1+ Coordination - grossly normal Gait - grossly normal Balance - grossly normal Cranial Nerves: I: smell Not tested  II: visual acuity  OS: nl    OD: nl  II: visual fields Full to confrontation  II: pupils Equal, round, reactive to light  III,VII: ptosis None  III,IV,VI: extraocular muscles  Full ROM  V: mastication Normal  V: facial light touch sensation  Normal  V,VII: corneal reflex  Present  VII: facial muscle function - upper  Normal  VII: facial muscle function - lower Normal  VIII: hearing Not tested  IX: soft palate elevation   Normal  IX,X: gag reflex Present  XI: trapezius strength  5/5  XI: sternocleidomastoid strength 5/5  XI: neck flexion strength  5/5  XII: tongue strength  Normal    Data Review Lab Results  Component Value Date   WBC 6.9 08/10/2022   HGB 13.6 08/10/2022   HCT 42.2 08/10/2022   MCV 95.9 08/10/2022   PLT 241 08/10/2022   Lab Results  Component Value Date   NA 146 (H) 05/10/2020   K 4.0 05/10/2020   CL 108 05/10/2020   CO2 26 05/10/2020   BUN 20 05/10/2020   CREATININE 0.83 05/10/2020   GLUCOSE 91 05/10/2020   Lab Results  Component Value Date   INR 1.0 08/10/2022    Assessment/Plan:  Estimated body mass index is 37.08 kg/m as calculated from the following:   Height as of this encounter: '5\' 4"'$  (1.626 m).   Weight as of this encounter: 98 kg. Patient admitted for posterior lumbar interbody fusion L2-3 L3-4. Patient has failed a reasonable attempt at conservative therapy.  I explained the condition and procedure to the patient and answered any questions.  Patient wishes to proceed with procedure as planned. Understands risks/ benefits and typical outcomes of procedure.   Eustace Moore 08/24/2022 10:54 AM

## 2022-08-24 NOTE — Anesthesia Preprocedure Evaluation (Addendum)
Anesthesia Evaluation  Patient identified by MRN, date of birth, ID band Patient awake    Reviewed: Allergy & Precautions, NPO status , Patient's Chart, lab work & pertinent test results  History of Anesthesia Complications (+) history of anesthetic complications (hypotension)  Airway Mallampati: III  TM Distance: <3 FB Neck ROM: Limited    Dental  (+) Teeth Intact, Dental Advisory Given   Pulmonary sleep apnea and Continuous Positive Airway Pressure Ventilation ,    Pulmonary exam normal breath sounds clear to auscultation       Cardiovascular negative cardio ROS Normal cardiovascular exam Rhythm:Regular Rate:Normal     Neuro/Psych  Headaches, PSYCHIATRIC DISORDERS Anxiety Depression C2 cervical fracture   Neuromuscular disease    GI/Hepatic Neg liver ROS, GERD  Medicated and Controlled,  Endo/Other  negative endocrine ROSObesity   Renal/GU negative Renal ROS     Musculoskeletal  (+) Arthritis ,   Abdominal   Peds  Hematology negative hematology ROS (+)   Anesthesia Other Findings Day of surgery medications reviewed with the patient.  Reproductive/Obstetrics                            Anesthesia Physical Anesthesia Plan  ASA: 3  Anesthesia Plan: General   Post-op Pain Management: Tylenol PO (pre-op)*   Induction: Intravenous  PONV Risk Score and Plan: 3 and Midazolam, Dexamethasone and Ondansetron  Airway Management Planned: Oral ETT and Video Laryngoscope Planned  Additional Equipment: ClearSight  Intra-op Plan:   Post-operative Plan: Extubation in OR  Informed Consent: I have reviewed the patients History and Physical, chart, labs and discussed the procedure including the risks, benefits and alternatives for the proposed anesthesia with the patient or authorized representative who has indicated his/her understanding and acceptance.     Dental advisory given  Plan  Discussed with: CRNA  Anesthesia Plan Comments: (2nd PIV)       Anesthesia Quick Evaluation

## 2022-08-24 NOTE — Anesthesia Procedure Notes (Signed)
Procedure Name: Intubation Date/Time: 08/24/2022 11:53 AM  Performed by: Claris Che, CRNAPre-anesthesia Checklist: Patient identified, Emergency Drugs available, Suction available, Patient being monitored and Timeout performed Patient Re-evaluated:Patient Re-evaluated prior to induction Oxygen Delivery Method: Circle system utilized Preoxygenation: Pre-oxygenation with 100% oxygen Induction Type: IV induction and Cricoid Pressure applied Ventilation: Mask ventilation without difficulty Laryngoscope Size: Glidescope and 4 Grade View: Grade I Tube type: Oral Tube size: 7.5 mm Number of attempts: 1 Airway Equipment and Method: Stylet Placement Confirmation: ETT inserted through vocal cords under direct vision, positive ETCO2 and breath sounds checked- equal and bilateral Secured at: 23 cm Tube secured with: Tape Dental Injury: Teeth and Oropharynx as per pre-operative assessment

## 2022-08-25 DIAGNOSIS — Z853 Personal history of malignant neoplasm of breast: Secondary | ICD-10-CM | POA: Diagnosis not present

## 2022-08-25 DIAGNOSIS — M4316 Spondylolisthesis, lumbar region: Secondary | ICD-10-CM | POA: Diagnosis not present

## 2022-08-25 DIAGNOSIS — M5416 Radiculopathy, lumbar region: Secondary | ICD-10-CM | POA: Diagnosis not present

## 2022-08-25 DIAGNOSIS — M48061 Spinal stenosis, lumbar region without neurogenic claudication: Secondary | ICD-10-CM | POA: Diagnosis not present

## 2022-08-25 LAB — CBC
HCT: 33 % — ABNORMAL LOW (ref 36.0–46.0)
Hemoglobin: 10.6 g/dL — ABNORMAL LOW (ref 12.0–15.0)
MCH: 30.7 pg (ref 26.0–34.0)
MCHC: 32.1 g/dL (ref 30.0–36.0)
MCV: 95.7 fL (ref 80.0–100.0)
Platelets: 182 10*3/uL (ref 150–400)
RBC: 3.45 MIL/uL — ABNORMAL LOW (ref 3.87–5.11)
RDW: 13.2 % (ref 11.5–15.5)
WBC: 12.2 10*3/uL — ABNORMAL HIGH (ref 4.0–10.5)
nRBC: 0 % (ref 0.0–0.2)

## 2022-08-25 MED ORDER — ENOXAPARIN SODIUM 40 MG/0.4ML IJ SOSY
40.0000 mg | PREFILLED_SYRINGE | INTRAMUSCULAR | Status: DC
  Administered 2022-08-25: 40 mg via SUBCUTANEOUS
  Filled 2022-08-25: qty 0.4

## 2022-08-25 NOTE — Care Management Obs Status (Signed)
[facility] NOTIFICATION   Patient Details  Name: Alexis Fox MRN: [id number] Date of Birth: December 04, 1955   Medicare Observation Status Notification Given:  Yes    Bartholomew Crews, RN 08/25/2022, 1:16 PM

## 2022-08-25 NOTE — Anesthesia Postprocedure Evaluation (Signed)
Anesthesia Post Note  Patient: [name]  Procedure(s) Performed: Posterior Lumbar Interbody Fusion - Lumbar two -Lumbar three - Lumbar three-Lumbar four, removal of instrumentation Lumbar four-five (Back)     Patient location during evaluation: PACU Anesthesia Type: General Level of consciousness: awake and alert Pain management: pain level controlled Vital Signs Assessment: post-procedure vital signs reviewed and stable Respiratory status: spontaneous breathing, nonlabored ventilation, respiratory function stable and patient connected to nasal cannula oxygen Cardiovascular status: blood pressure returned to baseline and stable Postop Assessment: no apparent nausea or vomiting Anesthetic complications: no   No notable events documented.  Last Vitals:  Vitals:   08/25/22 0433 08/25/22 0808  BP: (!) 94/55 (!) 90/50  Pulse: 80 69  Resp: 15 17  Temp: 36.8 C 37.2 C  SpO2: 91% 95%    Last Pain:  Vitals:   08/25/22 2000  TempSrc:   PainSc: 7                  Alexis Fox P Stella Encarnacion

## 2022-08-25 NOTE — Evaluation (Signed)
Physical Therapy Evaluation Patient Details Name: Alexis Fox MRN: [id number] DOB: 1956-10-21 Today's Date: 08/25/2022  History of Present Illness  Pt is a 66 y/o female s/p L2-4 PLIF. PMH includes breast cancer, sleep apnea on CPAP, and s/p multiple back surgeries.  Clinical Impression  Patient is s/p above surgery resulting in the deficits listed below (see PT Problem List). Pt requiring min guard A for mobility tasks using RW. Slow, and guarded, but overall tolerated gait and stair navigation well. Pt familiar with back precautions and reviewed walking program for home Patient will benefit from skilled PT to increase their independence and safety with mobility (while adhering to their precautions) to allow discharge to the venue listed below.        Recommendations for follow up therapy are one component of a multi-disciplinary discharge planning process, led by the attending physician.  Recommendations may be updated based on patient status, additional functional criteria and insurance authorization.  Follow Up Recommendations No PT follow up      Assistance Recommended at Discharge Intermittent Supervision/Assistance  Patient can return home with the following  Assist for transportation;Assistance with cooking/housework    Equipment Recommendations None recommended by PT  Recommendations for Other Services       Functional Status Assessment Patient has had a recent decline in their functional status and demonstrates the ability to make significant improvements in function in a reasonable and predictable amount of time.     Precautions / Restrictions Precautions Precautions: Back Precaution Booklet Issued: Yes (comment) Precaution Comments: Pt aware of back precautions from previous surgery Required Braces or Orthoses: Spinal Brace Spinal Brace: Lumbar corset;Applied in supine position Restrictions Weight Bearing Restrictions: No      Mobility  Bed  Mobility Overal bed mobility: Needs Assistance Bed Mobility: Sidelying to Sit   Sidelying to sit: Supervision       General bed mobility comments: Pt using elevated HOB to assist with trunk elevation. Reports she has ajustable bed at home she can use the same way.    Transfers Overall transfer level: Needs assistance Equipment used: Rolling walker (2 wheels) Transfers: Sit to/from Stand Sit to Stand: Min guard           General transfer comment: Pt requesting PT to brace RW and she pulled up on RW to standing.    Ambulation/Gait Ambulation/Gait assistance: Min guard, Supervision Gait Distance (Feet): 200 Feet Assistive device: Rolling walker (2 wheels) Gait Pattern/deviations: Step-through pattern, Decreased stride length Gait velocity: Decreased     General Gait Details: Slow, cautious gait. Educated about walking program. Min guard to supervision for safety.  Stairs Stairs: Yes Stairs assistance: Min guard Stair Management: Step to pattern, Forwards, With walker Number of Stairs: 1 General stair comments: Practiced step to simulate home environment. Cues for LE sequencing.  Wheelchair Mobility    Modified Rankin (Stroke Patients Only)       Balance Overall balance assessment: Mild deficits observed, not formally tested                                           Pertinent Vitals/Pain Pain Assessment Pain Assessment: Faces Faces Pain Scale: Hurts even more Pain Location: back Pain Descriptors / Indicators: Grimacing, Guarding Pain Intervention(s): Limited activity within patient's tolerance, Monitored during session, Repositioned    Home Living Family/patient expects to be discharged to:: Private residence Living Arrangements: Spouse/significant  other Available Help at Discharge: Family Type of Home: House Home Access: Stairs to enter Entrance Stairs-Rails: None Entrance Stairs-Number of Steps: 1   Home Layout: One level Home  Equipment: Conservation officer, nature (2 wheels);Cane - single point;Shower seat      Prior Function Prior Level of Function : Independent/Modified Independent                     Hand Dominance        Extremity/Trunk Assessment   Upper Extremity Assessment Upper Extremity Assessment: Defer to OT evaluation    Lower Extremity Assessment Lower Extremity Assessment: Generalized weakness    Cervical / Trunk Assessment Cervical / Trunk Assessment: Back Surgery  Communication   Communication: No difficulties  Cognition Arousal/Alertness: Awake/alert Behavior During Therapy: WFL for tasks assessed/performed Overall Cognitive Status: Within Functional Limits for tasks assessed                                          General Comments      Exercises     Assessment/Plan    PT Assessment Patient needs continued PT services  PT Problem List Decreased mobility;Decreased activity tolerance;Pain       PT Treatment Interventions Gait training;DME instruction;Stair training;Functional mobility training;Therapeutic activities;Therapeutic exercise;Balance training;Patient/family education    PT Goals (Current goals can be found in the Care Plan section)  Acute Rehab PT Goals Patient Stated Goal: to decrease pain PT Goal Formulation: With patient Time For Goal Achievement: 09/08/22 Potential to Achieve Goals: Good    Frequency Min 5X/week     Co-evaluation               AM-PAC PT "6 Clicks" Mobility  Outcome Measure Help needed turning from your back to your side while in a flat bed without using bedrails?: None Help needed moving from lying on your back to sitting on the side of a flat bed without using bedrails?: A Little Help needed moving to and from a bed to a chair (including a wheelchair)?: A Little Help needed standing up from a chair using your arms (e.g., wheelchair or bedside chair)?: A Little Help needed to walk in hospital room?: A  Little Help needed climbing 3-5 steps with a railing? : A Little 6 Click Score: 19    End of Session Equipment Utilized During Treatment: Back brace Activity Tolerance: Patient tolerated treatment well Patient left: in bed (sitting EOB with OT present) Nurse Communication: Mobility status PT Visit Diagnosis: Other abnormalities of gait and mobility (R26.89);Pain Pain - part of body:  (back)    Time: 1324-1340 PT Time Calculation (min) (ACUTE ONLY): 16 min   Charges:   PT Evaluation $PT Eval Low Complexity: 1 Low          Reuel Derby, PT, DPT  Acute Rehabilitation Services  Office: [phone]   Rudean Hitt 08/25/2022, 2:04 PM

## 2022-08-25 NOTE — Progress Notes (Signed)
Subjective: Patient waived off PT/OT this morning, but now realizes she will need it as she is having significant back pain.  She does not feel ready to go home.   Reports leg pains improved compared with preop.  Objective: Vital signs in last 24 hours: Temp:  [97.8 F (36.6 C)-98.9 F (37.2 C)] 98.9 F (37.2 C) (09/23 0808) Pulse Rate:  [69-80] 69 (09/23 0808) Resp:  [13-23] 17 (09/23 0808) BP: (90-141)/(50-100) 90/50 (09/23 0808) SpO2:  [91 %-100 %] 95 % (09/23 0808)  Intake/Output from previous day: 09/22 0701 - 09/23 0700 In: 2351.5 [I.V.:1751.5; IV Piggyback:600] Out: 1400 [Urine:800; Blood:600] Intake/Output this shift: No intake/output data recorded.  Appears mildly uncomfortable in bed Incision dressing c/d/I MAEW Legs soft, nontender   Lab Results: Recent Labs    08/25/22 0056  WBC 12.2*  HGB 10.6*  HCT 33.0*  PLT 182   BMET No results for input(s): "NA", "K", "CL", "CO2", "GLUCOSE", "BUN", "CREATININE", "CALCIUM" in the last 72 hours.  Studies/Results: MR BRAIN W WO CONTRAST  Result Date: 08/25/2022  [facility] [address] [place], [place] [address] [phone] NEUROIMAGING REPORT STUDY DATE: 08/23/2022 PATIENT NAME: Alexis Fox DOB: [birth year] MRN: [id number] EXAM: MRI Brain with and without contrast ORDERING CLINICIAN: Asencion Partridge Dohmeier MD CLINICAL HISTORY: 66 year old woman with mild cognitive impairment, hypersomnia and history of breast cancer COMPARISON FILMS: CT head 03/27/2019 TECHNIQUE:MRI of the brain with and without contrast was obtained utilizing 5 mm axial slices with T1, T2, T2 flair, SWI and diffusion weighted views.  T1 sagittal, T2 coronal and postcontrast views in the axial and coronal plane were obtained.  CONTRAST: 15 ml Multihance IMAGING SITE: [facility], [place], [place], [place] FINDINGS: On sagittal images, the spinal cord is imaged caudally to C4.  There is anterior fusion at C2-C3.  The  spinal cord is normal in caliber.  The cervicomedullary junction appears normal.  The pituitary gland and optic chiasm appear normal.    Brain volume appears normal.   The ventricles are normal in size and without distortion.  There are no abnormal extra-axial collections of fluid.  The cerebellum and brainstem appears normal.   The deep gray matter appears normal.  The cerebral hemispheres appear normal for age with just a couple punctate T2/FLAIR hyperintense foci in the subcortical or deep white matter.  None of these enhance or appear to be acute..   Diffusion weighted images are normal.  Susceptibility weighted images are normal.   The orbits appear normal.   The VIIth/VIIIth nerve complex appears normal.  The mastoid air cells appear normal.  The paranasal sinuses appear normal.  Flow voids are identified within the major intracerebral arteries.  After the infusion of contrast material, a normal enhancement pattern is noted.   This MRI of the brain with and without contrast shows the following: Brain volume was normal Just a few punctate T2/FLAIR hyperintense foci in the hemispheres consistent with minimal chronic microvascular ischemic change, typical for age. No acute findings.  Normal enhancement pattern. INTERPRETING PHYSICIAN: Richard A. Felecia Shelling, MD, PhD, FAAN Certified in  Neuroimaging by [employer]   DG Lumbar Spine 2-3 Views  Result Date: 08/24/2022 CLINICAL DATA:  Portable operative imaging provided for lumbar spine fusion. EXAM: LUMBAR SPINE - 2-3 VIEW; DG C-ARM 1-60 MIN-NO REPORT COMPARISON:  04/05/2022. FLUOROSCOPY: Exposure Index (as provided by the fluoroscopic device): 59.75 MGy Kerma FINDINGS: Images show bilateral pedicle screws L2, L3 and L4 with interconnecting rods.  Screws appear well seated. There also bilateral pedicle screws without interconnecting rods at L5. Intervertebral cages are no L2-L3, L3-L4 L4-L5, well centered. Normal alignment. IMPRESSION: Portable  imaging provided for posterior lumbar spine fusion at L2-L3 and L3-L4. Electronically Signed   By: Lajean Manes M.D.   On: 08/24/2022 16:12   DG C-Arm 1-60 Min-No Report  Result Date: 08/24/2022 CLINICAL DATA:  Portable operative imaging provided for lumbar spine fusion. EXAM: LUMBAR SPINE - 2-3 VIEW; DG C-ARM 1-60 MIN-NO REPORT COMPARISON:  04/05/2022. FLUOROSCOPY: Exposure Index (as provided by the fluoroscopic device): 59.75 MGy Kerma FINDINGS: Images show bilateral pedicle screws L2, L3 and L4 with interconnecting rods. Screws appear well seated. There also bilateral pedicle screws without interconnecting rods at L5. Intervertebral cages are no L2-L3, L3-L4 L4-L5, well centered. Normal alignment. IMPRESSION: Portable imaging provided for posterior lumbar spine fusion at L2-L3 and L3-L4. Electronically Signed   By: Lajean Manes M.D.   On: 08/24/2022 16:12   DG C-Arm 1-60 Min-No Report  Result Date: 08/24/2022 CLINICAL DATA:  Portable operative imaging provided for lumbar spine fusion. EXAM: LUMBAR SPINE - 2-3 VIEW; DG C-ARM 1-60 MIN-NO REPORT COMPARISON:  04/05/2022. FLUOROSCOPY: Exposure Index (as provided by the fluoroscopic device): 59.75 MGy Kerma FINDINGS: Images show bilateral pedicle screws L2, L3 and L4 with interconnecting rods. Screws appear well seated. There also bilateral pedicle screws without interconnecting rods at L5. Intervertebral cages are no L2-L3, L3-L4 L4-L5, well centered. Normal alignment. IMPRESSION: Portable imaging provided for posterior lumbar spine fusion at L2-L3 and L3-L4. Electronically Signed   By: Lajean Manes M.D.   On: 08/24/2022 16:12   DG C-Arm 1-60 Min-No Report  Result Date: 08/24/2022 CLINICAL DATA:  Portable operative imaging provided for lumbar spine fusion. EXAM: LUMBAR SPINE - 2-3 VIEW; DG C-ARM 1-60 MIN-NO REPORT COMPARISON:  04/05/2022. FLUOROSCOPY: Exposure Index (as provided by the fluoroscopic device): 59.75 MGy Kerma FINDINGS: Images show  bilateral pedicle screws L2, L3 and L4 with interconnecting rods. Screws appear well seated. There also bilateral pedicle screws without interconnecting rods at L5. Intervertebral cages are no L2-L3, L3-L4 L4-L5, well centered. Normal alignment. IMPRESSION: Portable imaging provided for posterior lumbar spine fusion at L2-L3 and L3-L4. Electronically Signed   By: Lajean Manes M.D.   On: 08/24/2022 16:12   DG C-Arm 1-60 Min-No Report  Result Date: 08/24/2022 Fluoroscopy was utilized by the requesting physician.  No radiographic interpretation.    Assessment/Plan: S/p extension of fusion/PLIF L2-3, L3-4 - will call back PT/OT to help with mobilization - pain control - will start lovenox for DVT ppx    Vallarie Mare 08/25/2022, 12:18 PM

## 2022-08-25 NOTE — Evaluation (Signed)
Occupational Therapy Evaluation Patient Details Name: Alexis Fox MRN: [id number] DOB: 01/07/1956 Today's Date: 08/25/2022   History of Present Illness Pt is a 66 y/o female s/p L2-4 PLIF. PMH includes breast cancer, sleep apnea on CPAP, and s/p multiple back surgeries.   Clinical Impression   Patient evaluated by Occupational Therapy with no further acute OT needs identified. All education including resuming ADLs while maintaining spinal precautions has been completed and the patient has no further questions.  Handout provided to reinforce all education. See below for any follow-up Occupational Therapy or equipment needs. OT is signing off. Thank you for this referral.       Recommendations for follow up therapy are one component of a multi-disciplinary discharge planning process, led by the attending physician.  Recommendations may be updated based on patient status, additional functional criteria and insurance authorization.   Follow Up Recommendations  No OT follow up    Assistance Recommended at Discharge Intermittent Supervision/Assistance  Patient can return home with the following A little help with bathing/dressing/bathroom;Assistance with cooking/housework;Assist for transportation    Functional Status Assessment  Patient has had a recent decline in their functional status and demonstrates the ability to make significant improvements in function in a reasonable and predictable amount of time.  Equipment Recommendations  Other (comment) (Attachable bidet)    Recommendations for Other Services       Precautions / Restrictions Precautions Precautions: Back Precaution Booklet Issued: Yes (comment) Precaution Comments: Pt aware of back precautions from previous surgery Required Braces or Orthoses: Spinal Brace Spinal Brace: Lumbar corset Restrictions Weight Bearing Restrictions: No      Mobility Bed Mobility   Bed Mobility: Sit to Sidelying         Sit  to sidelying: Modified independent (Device/Increase time) General bed mobility comments: Pt performed reverse log roll technique to return to bed Mod I, no need for cues. Pt has adjustable bed at home.    Transfers                          Balance Overall balance assessment: Mild deficits observed, not formally tested                                         ADL either performed or assessed with clinical judgement   ADL Overall ADL's : Needs assistance/impaired Eating/Feeding: Independent   Grooming: Wash/dry face;Oral care;Cueing for compensatory techniques;Supervision/safety Grooming Details (indicate cue type and reason): OT demonstrated how to approach sink with RW and demonstrated use of cup to expectorate with oral hgyiene and use of washcloth for washing face both to avoid forward trunk flexion over seat. Upper Body Bathing: Set up;Sitting   Lower Body Bathing: Cueing for compensatory techniques;Sit to/from stand;Sitting/lateral leans;With adaptive equipment;Supervison/ safety   Upper Body Dressing : Modified independent;Sitting Upper Body Dressing Details (indicate cue type and reason): Pt instructed on avoidance of trunk flexion when donning/doffing overhead shirts and avoidance of twisting with open front shirts.   Lower Body Dressing Details (indicate cue type and reason): Pt able to demonstrate figure 4 position at EOB but with increased effort and still pulling on LE. Pt instructed in use of long reacher as altrernative to donning/doffing LB clothing and instructed to sit whenever donning or doffing over feet. Toilet Transfer: Ambulation;Min guard;Supervision/safety;Rolling walker (2 wheels) Toilet Transfer Details (indicate cue type and  reason): Pt reports feeling safer with RW. Pt has Rollator at home. Instructed for pt to be aware of upright posture at rollator can promote spinal flexion. Pt reports her spouse can help with shoes and  socks. Toileting- Clothing Manipulation and Hygiene: With adaptive equipment Toileting - Clothing Manipulation Details (indicate cue type and reason): Pt reports baseline difficulty with posterior peri hgyiene. Pt educated on adaptive options including attachable bidet, bottom buddy and toileting tongs. Pt shown pictures of bidet per her request to better understand workings.     Functional mobility during ADLs: Supervision/safety;Min guard;Rolling walker (2 wheels)       Vision Baseline Vision/History: 0 No visual deficits       Perception Perception Perception: Within Functional Limits   Praxis Praxis Praxis: Intact    Pertinent Vitals/Pain Pain Assessment Pain Assessment: Faces Faces Pain Scale: Hurts even more Pain Location: back Pain Descriptors / Indicators: Grimacing, Guarding Pain Intervention(s): Limited activity within patient's tolerance, Monitored during session, Relaxation, Repositioned (Ice offered. Pt declined)     Hand Dominance Right   Extremity/Trunk Assessment Upper Extremity Assessment Upper Extremity Assessment: Overall WFL for tasks assessed   Lower Extremity Assessment Lower Extremity Assessment: Generalized weakness   Cervical / Trunk Assessment Cervical / Trunk Assessment: Back Surgery   Communication Communication Communication: No difficulties   Cognition Arousal/Alertness: Awake/alert Behavior During Therapy: WFL for tasks assessed/performed Overall Cognitive Status: Within Functional Limits for tasks assessed                                       General Comments       Exercises     Shoulder Instructions      Home Living Family/patient expects to be discharged to:: Private residence Living Arrangements: Spouse/significant other Available Help at Discharge: Family Type of Home: House Home Access: Stairs to enter Technical brewer of Steps: 1 Entrance Stairs-Rails: None Home Layout: One level      Bathroom Shower/Tub: Occupational psychologist: Handicapped height     Home Equipment: Conservation officer, nature (2 wheels);Cane - single point;Shower seat;Adaptive equipment;Rollator (4 wheels) Adaptive Equipment: Long-handled sponge;Reacher Additional Comments: Adjustable bed      Prior Functioning/Environment Prior Level of Function : Independent/Modified Independent               ADLs Comments: Retired Company secretary        OT Problem List: Pain;Decreased knowledge of use of DME or AE      OT Treatment/Interventions:      OT Goals(Current goals can be found in the care plan section) Acute Rehab OT Goals Patient Stated Goal: Decreased pain and home today OT Goal Formulation: All assessment and education complete, DC therapy Potential to Achieve Goals: Good ADL Goals Additional ADL Goal #1: Pt will demonstrate or verbalize understanding to back precautions, wear and use of brace, UE/LE ADLs, log roll technique for bed mobility, and compensatory strategies to allow pt to comply with all back precautions after discharge.  OT Frequency:      Co-evaluation              AM-PAC OT "6 Clicks" Daily Activity     Outcome Measure Help from another person eating meals?: None Help from another person taking care of personal grooming?: None Help from another person toileting, which includes using toliet, bedpan, or urinal?: A Little Help from another person bathing (including washing, rinsing,  drying)?: A Little Help from another person to put on and taking off regular upper body clothing?: None Help from another person to put on and taking off regular lower body clothing?: A Little 6 Click Score: 21   End of Session Equipment Utilized During Treatment: Rolling walker (2 wheels)  Activity Tolerance: Patient tolerated treatment well Patient left: in bed;with call bell/phone within reach  OT Visit Diagnosis: Pain;Unsteadiness on feet (R26.81) Pain - part of body:  (back)                 Time: 1319-1401 OT Time Calculation (min): 42 min Charges:  OT General Charges $OT Visit: 1 Visit OT Evaluation $OT Eval Low Complexity: 1 Low OT Treatments $Self Care/Home Management : 23-37 mins  Anderson Malta, OT Acute Rehab Services Office: [phone] 08/25/2022  Julien Girt 08/25/2022, 2:23 PM

## 2022-08-25 NOTE — TOC Initial Note (Signed)
Transition of Care [facility]) - Initial/Assessment Note    Patient Details  Name: Alexis Fox MRN: [id number] Date of Birth: [birth year]  Transition of Care [facility]) CM/SW Contact:    Bartholomew Crews, RN Phone Number: [phone] 08/25/2022, 1:17 PM  Clinical Narrative:                  Spoke with patient at the bedside to discuss post acute transition. Patient stated that she does not need any DME stating she has a closet full. Patient stated that MD is extending her stay. TOC following for potential transition needs.   Expected Discharge Plan: Home/Self Care Barriers to Discharge: Continued Medical Work up   Patient Goals and CMS Choice Patient states their goals for this hospitalization and ongoing recovery are:: return home CMS Medicare.gov Compare Post Acute Care list provided to:: Patient Choice offered to / list presented to : NA  Expected Discharge Plan and Services Expected Discharge Plan: Home/Self Care     Post Acute Care Choice: NA                   DME Arranged: N/A DME Agency: NA       HH Arranged: NA HH Agency: NA        Prior Living Arrangements/Services                       Activities of Daily Living Home Assistive Devices/Equipment: None ADL Screening (condition at time of admission) Patient's cognitive ability adequate to safely complete daily activities?: Yes Is the patient deaf or have difficulty hearing?: No Does the patient have difficulty seeing, even when wearing glasses/contacts?: No Does the patient have difficulty concentrating, remembering, or making decisions?: No Patient able to express need for assistance with ADLs?: Yes Does the patient have difficulty dressing or bathing?: No Independently performs ADLs?: Yes (appropriate for developmental age) Does the patient have difficulty walking or climbing stairs?: No Weakness of Legs: Both Weakness of Arms/Hands: None  Permission Sought/Granted                   Emotional Assessment              Admission diagnosis:  S/P lumbar fusion [Z98.1] Patient Active Problem List   Diagnosis Date Noted   Hypersomnia, persistent 08/21/2022   Excessive daytime sleepiness 08/21/2022   MCI (mild cognitive impairment) 08/21/2022   History of breast cancer 08/21/2022   OSA on CPAP 08/21/2022   Pain in left shoulder 03/01/2021   Allergic rhinitis 02/21/2021   Anxiety 02/21/2021   Contact dermatitis due to plant 02/21/2021   Dysphagia 02/21/2021   Estrogen receptor negative status (ER-) 02/21/2021   Gastroesophageal reflux disease 02/21/2021   Gastroparesis 02/21/2021   Insomnia 02/21/2021   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[place]) 02/21/2021   Morton's neuroma of left foot 02/21/2021   Personal history of malignant neoplasm of breast 02/21/2021   Vitamin D deficiency 02/21/2021   Oth nondisplaced dens fracture, init for clos fx ([place]) 02/04/2020   S/P cervical spinal fusion 01/22/2020   Memory deficit 01/14/2020   Sacroiliac dysfunction 01/14/2020   Neck pain 11/05/2019   Body mass index (BMI) 25.0-25.9, adult 11/05/2019   Unilateral primary osteoarthritis, right hip 09/01/2019   Trochanteric bursitis, left hip 07/14/2019   S/P lumbar fusion 05/18/2019   OSA (obstructive sleep apnea) 10/28/2018   Pain managed using patient-controlled analgesia (PCA) 10/25/2018   Fall 10/24/2018   Fall from ladder  10/23/2018   DDD (degenerative disc disease), lumbosacral 09/15/2018   Chemotherapy-induced neuropathy ([place]) 09/15/2018   Central line complication 27/61/8485   Genetic testing 05/15/2016   Family history of breast cancer    Family history of brain cancer    Breast cancer of upper-outer quadrant of left female breast ([place]) 03/20/2016   Transaminitis 10/03/2015   Symptomatic cholelithiasis 10/03/2015   RUQ abdominal pain    Lumbosacral neuritis 11/10/2014   Lumbar radiculopathy 10/19/2014   Depression 06/24/2013   RLS (restless legs  syndrome) 06/24/2013   Low back pain 05/19/2013   PCP:  [name], MD Pharmacy:   [facility] [place] [address] Phone: [phone] Fax: [phone]  [id number] [place], [place] - [address] [facility] [address]: [phone]: [phone]    Social Determinants of Health (SDOH) Interventions    Readmission Risk Interventions     No data to display

## 2022-08-25 NOTE — Progress Notes (Signed)
OT Cancellation Note  Patient Details Name: Alexis Fox MRN: [id number] DOB: [birth year]   Cancelled Treatment:    Reason Eval/Treat Not Completed: OT screened, no needs identified, will sign off. Pt with handout and past knowledge of all spinal precautions from past spinal surgeries. Pt has no DME/AE needs and requesting home asap. No therapy needs. OT to sign off.   Julien Girt 08/25/2022, 9:33 AM

## 2022-08-25 NOTE — Progress Notes (Signed)
PT Cancellation Note  Patient Details Name: Alexis Fox MRN: [id number] DOB: 1956-03-03   Cancelled Treatment:    Reason Eval/Treat Not Completed: PT screened, no needs identified, will sign off Pt declining need for acute PT; states she has had 3 prior fusions and is well versed on spinal precautions. Pt stating she has ambulated twice without issue.   [name], PT, DPT Acute Rehabilitation Services Office [phone]    [name] 08/25/2022, 9:05 AM

## 2022-08-25 NOTE — Progress Notes (Signed)
Foley dc'd per MD order. Pt ambulated to BR, unable to void at this time. Will encourage fluids and monitor closely

## 2022-08-26 DIAGNOSIS — M5416 Radiculopathy, lumbar region: Secondary | ICD-10-CM | POA: Diagnosis not present

## 2022-08-26 DIAGNOSIS — Z853 Personal history of malignant neoplasm of breast: Secondary | ICD-10-CM | POA: Diagnosis not present

## 2022-08-26 DIAGNOSIS — M4316 Spondylolisthesis, lumbar region: Secondary | ICD-10-CM | POA: Diagnosis not present

## 2022-08-26 DIAGNOSIS — M48061 Spinal stenosis, lumbar region without neurogenic claudication: Secondary | ICD-10-CM | POA: Diagnosis not present

## 2022-08-26 MED ORDER — METHOCARBAMOL 750 MG PO TABS: 750.0000 mg | ORAL_TABLET | Freq: Four times a day (QID) | ORAL | 2 refills | Status: DC | PRN

## 2022-08-26 MED ORDER — OXYCODONE-ACETAMINOPHEN 10-325 MG PO TABS: 1.0000 | ORAL_TABLET | ORAL | 0 refills | Status: DC | PRN

## 2022-08-26 NOTE — Discharge Summary (Signed)
Physician Discharge Summary  Patient ID: Alexis Fox MRN: [id number] DOB/AGE: 1956-10-03 66 y.o.  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 The patient tolerated the procedure well and was taken to the recovery room and then to the floor in stable condition. The hospital course was routine. There were no complications. The wound remained clean dry and intact. Pt had appropriate back soreness. No complaints of leg pain or new N/T/W. The patient remained afebrile with stable vital signs, and tolerated a regular diet. The patient continued to increase activities, and pain was well controlled with oral pain medications.   Consults: None  Significant Diagnostic Studies: radiology: X-Ray: intraoperative   Treatments: surgery:  1. Decompressive lumbar laminectomy, hemi facetectomy and foraminotomies L2-3 and L3-4 requiring more work than would be required for a simple exposure of the disk for PLIF in order to adequately decompress the neural elements and address the spinal stenosis 2. Posterior lumbar interbody fusion L2-3 and L3-4 using porous titanium interbody cages packed with morcellized allograft and autograft  3. Posterior fixation L2-L4 inclusive using Alphatec cortical pedicle screws.  4. Intertransverse arthrodesis L2-L4 using morcellized autograft and allograft. 5.  Removal of nonsegmental fixation L4-5  Discharge Exam: Blood pressure (!) 114/57, pulse 71, temperature 98.2 F (36.8 C), resp. rate 18, height '5\' 4"'$  (1.626  m), weight 98 kg, SpO2 93 %. Physical Exam: Patient is awake, A/O X 4, conversant, and in good spirits. Eyes open spontaneously. They are in NAD and VSS. Doing well. Speech is fluent and appropriate. MAEW. Sensation to light touch is intact. PERLA, EOMI. CNs grossly intact. Dressing is clean dry intact. Incision is well approximated with no drainage, erythema, or edema.      Disposition: Discharge disposition: 01-Home or Self Care        Allergies as of 08/26/2022       Reactions   Turmeric Diarrhea   Severe diarrhea   Decadron [dexamethasone]    Exacerbates restless leg        Medication List     TAKE these medications    buPROPion 150 MG 24 hr tablet Commonly known as: WELLBUTRIN XL Take 150 mg by mouth daily.   cholecalciferol 25 MCG (1000 UNIT) tablet Commonly known as: VITAMIN D3 Take 1,000 Units by mouth in the morning and at bedtime.   citalopram 40 MG tablet Commonly known as: CELEXA Take 40 mg by mouth at bedtime.   clobetasol ointment 0.05 % Commonly known as: TEMOVATE Apply 1 application topically 2 (two) times daily. What changed:  when to take this reasons to take this   clonazePAM 1 MG tablet Commonly known as: KLONOPIN Take 1 mg by mouth at bedtime.   docusate sodium 100 MG capsule Commonly known as: COLACE Take 100 mg by mouth daily.   esomeprazole 40 MG capsule Commonly known as: NEXIUM Take 40 mg by mouth 2 (two) times daily.   fluticasone 50 MCG/ACT nasal spray Commonly known as: FLONASE Place 1 spray into both nostrils daily as needed for allergies.   Iron 325 (65 Fe) MG Tabs Take 325 mg by mouth daily.   methocarbamol  750 MG tablet Commonly known as: Robaxin-750 Take 1 tablet (750 mg total) by mouth every 6 (six) hours as needed for muscle spasms.   multivitamin with minerals tablet Take 1 tablet by mouth at bedtime.   oxyCODONE-acetaminophen 10-325 MG tablet Commonly known as: Percocet Take 1 tablet by mouth every 4  (four) hours as needed for pain.   Pramipexole Dihydrochloride 0.75 MG Tb24 Commonly known as: Mirapex ER Take 1 tablet (0.75 mg total) by mouth daily. What changed:  when to take this reasons to take this   pramipexole 0.5 MG tablet Commonly known as: MIRAPEX TAKE 1 TABLET (0.5 MG TOTAL) BY MOUTH DAILY AS NEEDED (TAKE ALONG WITH EXTENDED RELEASE DOSE) What changed: See the new instructions.   Vitamin C 500 MG Caps Take 500 mg by mouth daily.               Durable Medical Equipment  (From admission, onward)           Start     Ordered   08/24/22 1843  DME Walker rolling  Once       Question:  Patient needs a walker to treat with the following condition  Answer:  S/P lumbar fusion   08/24/22 1842   08/24/22 1843  DME 3 n 1  Once        08/24/22 1842             Signed: Marvis Moeller 08/26/2022, 11:35 AM

## 2022-08-26 NOTE — Progress Notes (Signed)
AVS given and explained to patient. 

## 2022-08-27 ENCOUNTER — Encounter: Payer: Self-pay | Admitting: Neurology

## 2022-08-27 MED FILL — Thrombin For Soln 5000 Unit: CUTANEOUS | Qty: 5000 | Status: AC

## 2022-08-28 NOTE — Telephone Encounter (Signed)
Called and spoke w/ pt about MRI results. Pt verbalized understanding.

## 2022-09-11 DIAGNOSIS — M5137 Other intervertebral disc degeneration, lumbosacral region: Secondary | ICD-10-CM | POA: Diagnosis not present

## 2022-09-11 DIAGNOSIS — E669 Obesity, unspecified: Secondary | ICD-10-CM | POA: Diagnosis not present

## 2022-09-11 DIAGNOSIS — Z1331 Encounter for screening for depression: Secondary | ICD-10-CM | POA: Diagnosis not present

## 2022-09-11 DIAGNOSIS — E8889 Other specified metabolic disorders: Secondary | ICD-10-CM | POA: Diagnosis not present

## 2022-09-11 DIAGNOSIS — F325 Major depressive disorder, single episode, in full remission: Secondary | ICD-10-CM | POA: Diagnosis not present

## 2022-09-11 DIAGNOSIS — Z6835 Body mass index (BMI) 35.0-35.9, adult: Secondary | ICD-10-CM | POA: Diagnosis not present

## 2022-09-11 DIAGNOSIS — G4733 Obstructive sleep apnea (adult) (pediatric): Secondary | ICD-10-CM | POA: Diagnosis not present

## 2022-09-12 ENCOUNTER — Encounter: Payer: Self-pay | Admitting: Neurology

## 2022-09-13 ENCOUNTER — Ambulatory Visit: Payer: Medicare PPO | Admitting: Orthopaedic Surgery

## 2022-09-13 ENCOUNTER — Encounter: Payer: Self-pay | Admitting: Orthopaedic Surgery

## 2022-09-13 ENCOUNTER — Ambulatory Visit (INDEPENDENT_AMBULATORY_CARE_PROVIDER_SITE_OTHER): Payer: Medicare PPO

## 2022-09-13 DIAGNOSIS — M25572 Pain in left ankle and joints of left foot: Secondary | ICD-10-CM | POA: Diagnosis not present

## 2022-09-13 MED ORDER — LIDOCAINE HCL 1 % IJ SOLN
1.0000 mL | INTRAMUSCULAR | Status: AC | PRN
  Administered 2022-09-13: 1 mL

## 2022-09-13 MED ORDER — METHYLPREDNISOLONE ACETATE 40 MG/ML IJ SUSP
10.0000 mg | INTRAMUSCULAR | Status: AC | PRN
  Administered 2022-09-13: 10 mg via INTRA_ARTICULAR

## 2022-09-13 NOTE — Progress Notes (Signed)
Office Visit Note   Patient: Alexis Fox           Date of Birth: [birth year]           MRN: [id number] Visit Date: 09/13/2022              Requested by: Alexis Orn, MD No address on file PCP: Alexis Orn, MD   Assessment & Plan: Visit Diagnoses:  1. Pain in left ankle and joints of left foot     Plan: Alexis Fox has been experiencing some pain directly over the deltoid ligament of her left ankle.  There is no history of injury or trauma.  She does spend a lot of time on her feet.  The she has excellent ankle range of motion and no evidence of instability and no pain along the posterior tibial tendon.  I am going to inject the area of tenderness about the deltoid ligament just monitor response  Follow-Up Instructions: Return if symptoms worsen or fail to improve.   Orders:  Orders Placed This Encounter  Procedures   XR Ankle 2 Views Left   No orders of the defined types were placed in this encounter.     Procedures: Medium Joint Inj on 09/13/2022 4:41 PM Indications: pain Details: 25 G 1.5 in needle, anteromedial approach Medications: 1 mL lidocaine 1 %; 10 mg methylPREDNISolone acetate 40 MG/ML      Clinical Data: No additional findings.   Subjective: Chief Complaint  Patient presents with   Left Ankle - Pain  Patient presents today with left ankle pain that started a few months ago. No known injury. Her pain is located medially, and seems worse in the evening. Massage helps. No swelling. She had spinal fusion 3 weeks ago with Dr.Jones.  HPI  Review of Systems   Objective: Vital Signs: There were no vitals taken for this visit.  Physical Exam Constitutional:      Appearance: She is well-developed.  Eyes:     Pupils: Pupils are equal, round, and reactive to light.  Pulmonary:     Effort: Pulmonary effort is normal.  Skin:    General: Skin is warm and dry.  Neurological:     Mental Status: She is alert and oriented to person, place, and  time.  Psychiatric:        Behavior: Behavior normal.     Ortho Exam awake alert and oriented x3.  Comfortable sitting and walking without a limp no acute distress.  Pain in the left ankle is localized directly over the deltoid ligament.  There is no redness or ecchymosis or edema.  No pain about the posterior tibial tendon.  Normal inversion and eversion neurologically intact.  Good capillary refill to toes  Specialty Comments:  No specialty comments available.  Imaging: XR Ankle 2 Views Left  Result Date: 09/13/2022 Films of the left ankle obtained in several projections.  Alexis Fox is having some pain directly over the deltoid ligament without obvious injury or trauma.  I did not see any specific abnormalities of the joint or bone at that level    PMFS History: Patient Active Problem List   Diagnosis Date Noted   Pain in left ankle and joints of left foot 09/13/2022   Hypersomnia, persistent 08/21/2022   Excessive daytime sleepiness 08/21/2022   MCI (mild cognitive impairment) 08/21/2022   History of breast cancer 08/21/2022   OSA on CPAP 08/21/2022   Pain in left shoulder 03/01/2021   Allergic rhinitis 02/21/2021  Anxiety 02/21/2021   Contact dermatitis due to plant 02/21/2021   Dysphagia 02/21/2021   Estrogen receptor negative status (ER-) 02/21/2021   Gastroesophageal reflux disease 02/21/2021   Gastroparesis 02/21/2021   Insomnia 02/21/2021   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[place]) 02/21/2021   Morton's neuroma of left foot 02/21/2021   Personal history of malignant neoplasm of breast 02/21/2021   Vitamin D deficiency 02/21/2021   Oth nondisplaced dens fracture, init for clos fx ([place]) 02/04/2020   S/P cervical spinal fusion 01/22/2020   Memory deficit 01/14/2020   Sacroiliac dysfunction 01/14/2020   Neck pain 11/05/2019   Body mass index (BMI) 25.0-25.9, adult 11/05/2019   Unilateral primary osteoarthritis, right hip 09/01/2019   Trochanteric bursitis,  left hip 07/14/2019   S/P lumbar fusion 05/18/2019   OSA (obstructive sleep apnea) 10/28/2018   Pain managed using patient-controlled analgesia (PCA) 10/25/2018   Fall 10/24/2018   Fall from ladder 10/23/2018   DDD (degenerative disc disease), lumbosacral 09/15/2018   Chemotherapy-induced neuropathy ([place]) 09/15/2018   Central line complication 75/09/2584   Genetic testing 05/15/2016   Family history of breast cancer    Family history of brain cancer    Breast cancer of upper-outer quadrant of left female breast ([place]) 03/20/2016   Transaminitis 10/03/2015   Symptomatic cholelithiasis 10/03/2015   RUQ abdominal pain    Lumbosacral neuritis 11/10/2014   Lumbar radiculopathy 10/19/2014   Depression 06/24/2013   RLS (restless legs syndrome) 06/24/2013   Low back pain 05/19/2013   Past Medical History:  Diagnosis Date   Anxiety    Arthritis    knees   Breast cancer ([place])    Breast cancer of upper-outer quadrant of left female breast ([place]) 03/20/2016   C2 cervical fracture ([place])    10/22/18, following fall from ladder   Cervicalgia    Cholecystitis    Complication of anesthesia    BP "crashes" post op   Depression    Early cataracts, bilateral    Family history of brain cancer    Family history of breast cancer    GERD (gastroesophageal reflux disease)    Headache    " from my neck"   Hot flashes    Personal history of chemotherapy    Personal history of radiation therapy    Restless leg syndrome    Sleep apnea 10/03/2018   wears CPAP    Family History  Problem Relation Age of Onset   Heart disease Mother    Pulmonary fibrosis Mother    Hypertension Mother    Cancer Father 48       astocytoma   Hypertension Brother    Lymphoma Maternal Aunt    Anesthesia problems Neg Hx     Past Surgical History:  Procedure Laterality Date   ANTERIOR CERVICAL DECOMP/DISCECTOMY FUSION N/A 01/22/2020   Procedure: ACDF - C2-C3;  Surgeon: Eustace Moore, MD;  Location: [place];   Service: Neurosurgery;  Laterality: N/A;   BACK SURGERY     x2   BREAST BIOPSY     BREAST LUMPECTOMY Left 2018   BUNIONECTOMY     CHOLECYSTECTOMY N/A 10/04/2015   Procedure: LAPAROSCOPIC CHOLECYSTECTOMY WITH INTRAOPERATIVE CHOLANGIOGRAM;  Surgeon: Greer Pickerel, MD;  Location: [place];  Service: General;  Laterality: N/A;   ELBOW SURGERY     right for epicondylitis 2010    ESOPHAGEAL MANOMETRY N/A 11/30/2020   Procedure: ESOPHAGEAL MANOMETRY (EM);  Surgeon: Ronnette Juniper, MD;  Location: [facility];  Service: Gastroenterology;  Laterality: N/A;  FOOT SURGERY     1983 -tarsal tunnel release   IR GENERIC HISTORICAL  10/26/2016   IR CV LINE INJECTION 10/26/2016 [facility]   LUMBAR DISC SURGERY  04/04/2012   MASS EXCISION Left 02/06/2021   Procedure: EXCISION LEFT FOOT SOFT TISSUE BETWEEN SECOND AND THIRD AND THIRD AND FOURTH TOES;  Surgeon: Landis Martins, DPM;  Location: [place];  Service: Podiatry;  Laterality: Left;   PORT-A-CATH REMOVAL N/A 11/15/2016   Procedure: REMOVAL PORT-A-CATH;  Surgeon: Rolm Bookbinder, MD;  Location: [place];  Service: General;  Laterality: N/A;   PORTACATH PLACEMENT Right 04/02/2016   Procedure: INSERTION PORT-A-CATH WITH [place];  Surgeon: Rolm Bookbinder, MD;  Location: [place];  Service: General;  Laterality: Right;   RADIOACTIVE SEED GUIDED PARTIAL MASTECTOMY/AXILLARY SENTINEL NODE BIOPSY/AXILLARY NODE DISSECTION Left 09/12/2016   Procedure: LEFT BREAST SEED GUIDED LUMPECTOMY, LEFT AXILLARY SENTINEL NODE BIOPSY, LEFT SEED GUIDED AXILLARY NODE EXCISION( TARGETED AXILLARY DISSECTION), BLUE DYE INJECTION;  Surgeon: Rolm Bookbinder, MD;  Location: [place];  Service: General;  Laterality: Left;   SACROILIAC JOINT FUSION Left 05/18/2020   Procedure: LEFT SACROILIAC JOINT FUSION;  Surgeon: Phylliss Bob, MD;  Location: [place];  Service: Orthopedics;  Laterality: Left;   SPINAL FUSION  5/12   L5-S1    SPINAL FUSION  05/18/2019   L4-5   TARSAL TUNNEL RELEASE     Social History   Occupational History   Not on file  Tobacco Use   Smoking status: Never   Smokeless tobacco: Never  Vaping Use   Vaping Use: Never used  Substance and Sexual Activity   Alcohol use: Yes    Alcohol/week: 1.0 standard drink of alcohol    Types: 1 Cans of beer per week    Comment: occasional   Drug use: No   Sexual activity: Not Currently    Birth control/protection: Post-menopausal

## 2022-09-21 DIAGNOSIS — R0683 Snoring: Secondary | ICD-10-CM | POA: Diagnosis not present

## 2022-09-21 DIAGNOSIS — G4730 Sleep apnea, unspecified: Secondary | ICD-10-CM | POA: Diagnosis not present

## 2022-09-25 DIAGNOSIS — Z6835 Body mass index (BMI) 35.0-35.9, adult: Secondary | ICD-10-CM | POA: Diagnosis not present

## 2022-09-25 DIAGNOSIS — E669 Obesity, unspecified: Secondary | ICD-10-CM | POA: Diagnosis not present

## 2022-09-25 DIAGNOSIS — G4733 Obstructive sleep apnea (adult) (pediatric): Secondary | ICD-10-CM | POA: Diagnosis not present

## 2022-09-25 DIAGNOSIS — M5137 Other intervertebral disc degeneration, lumbosacral region: Secondary | ICD-10-CM | POA: Diagnosis not present

## 2022-10-04 ENCOUNTER — Telehealth: Payer: Self-pay

## 2022-10-04 NOTE — Patient Outreach (Signed)
  Care Coordination   Initial Visit Note   10/04/2022 Name: Alexis Fox MRN: [id number] DOB: 1956/10/19  Alexis Fox is a 66 y.o. year old female who sees [name], MD for primary care. I spoke with  Alexis Fox by phone today.  What matters to the patients health and wellness today?  Patient voices she is doing well and has no health needs or concerns. She shares that she used to work for [employer] several years ago and aware of services offered. She is aware that she can contact [facility] if needed.  Annul wellness visit completed 07/12/22. Patient with poor phone connection-got disconnected several times and last attempt unable to reach pt back.   Goals Addressed             This Visit's Progress    COMPLETED: Patient Stated       Care Coordination Interventions: Provided education to patient re: [facility] services Assessed social determinant of health barriers          SDOH assessments and interventions completed:  Yes  SDOH Interventions Today    Flowsheet Row Most Recent Value  SDOH Interventions   Transportation Interventions Intervention Not Indicated        Care Coordination Interventions Activated:  Yes  Care Coordination Interventions:  Yes, provided   Follow up plan: No further intervention required.   Encounter Outcome:  Pt. Visit Completed   Enzo Montgomery, RN,BSN,CCM [place] Management Telephonic Care Management Coordinator Direct Phone: [phone] Toll Free: [phone] Fax: [phone]

## 2022-10-09 DIAGNOSIS — M7061 Trochanteric bursitis, right hip: Secondary | ICD-10-CM | POA: Diagnosis not present

## 2022-10-09 DIAGNOSIS — M7062 Trochanteric bursitis, left hip: Secondary | ICD-10-CM | POA: Diagnosis not present

## 2022-10-09 DIAGNOSIS — M5416 Radiculopathy, lumbar region: Secondary | ICD-10-CM | POA: Diagnosis not present

## 2022-10-10 DIAGNOSIS — G4733 Obstructive sleep apnea (adult) (pediatric): Secondary | ICD-10-CM | POA: Diagnosis not present

## 2022-10-10 DIAGNOSIS — E669 Obesity, unspecified: Secondary | ICD-10-CM | POA: Diagnosis not present

## 2022-10-10 DIAGNOSIS — Z6835 Body mass index (BMI) 35.0-35.9, adult: Secondary | ICD-10-CM | POA: Diagnosis not present

## 2022-10-10 DIAGNOSIS — M5137 Other intervertebral disc degeneration, lumbosacral region: Secondary | ICD-10-CM | POA: Diagnosis not present

## 2022-10-15 ENCOUNTER — Encounter: Payer: Self-pay | Admitting: Adult Health

## 2022-10-15 ENCOUNTER — Encounter: Payer: Medicare PPO | Admitting: Adult Health

## 2022-10-15 NOTE — Progress Notes (Unsigned)
PATIENT: Alexis Fox DOB: [birth year]  REASON FOR VISIT: follow up HISTORY FROM: patient  Chief Complaint  Patient presents with   Follow-up    Pt in 19  Pt here for CPAP f/u Pt states doing better with CPAP. Pt states needs to f/u with her O2 home study      HISTORY OF PRESENT ILLNESS: Today 10/15/22:    12/20/21: Alexis Fox is a 66 year old female with a history of obstructive sleep apnea on CPAP and restless legs.  She returns today for follow-up.  Her download indicates that her CPAP is working well but she doesn't like using it. Srtates that she has to sleep supine but prefers to sleep on her side or stomach.  At the last visit we increased her pressure to 12 and her residual AHI has been reduced.  Her download is below.  Restless legs: She takes Mirapex 1 tablet in the morning and 1/2 tablet at bedtime. Reports that she is having more symptoms during the day. Reports that she will traveling for the next 1.5 years out [place]. States that when she is sitting for long periods she gets more symptoms in the legs and arms. She also will get nerve sensitivity down both legs starting mid buttocks. She has had multiple back surgeries in the past.     02/16/20: Alexis Fox is a 66 year old female with a history of obstructive sleep apnea on CPAP and restless leg syndrome.  She reports that Mirapex continues to work well for her.  This is currently being prescribed by her PCP.  Her CPAP download indicates that she use her machine 28 out of 30 days for compliance of 93%.  She used her machine greater than 4 hours 24 days for compliance of 80%.  On average she uses her machine 6 hours and 39 minutes.  Her residual AHI is 5.7 on 11 cm of water with EPR of 3.  Leak in the 95th percentile is 23.1 L/min.  She reports that the CPAP is working well for her.    HISTORY 06/18/19:   Alexis Fox is a 66 year old female with a history of obstructive sleep apnea on CPAP and restless leg  syndrome.  She returns today for follow-up.  Her CPAP download indicates that she use her machine 30 out of 30 days for compliance of 100%.  She use her machine greater than 4 hours 28 days for compliance of 93%.  On average she uses her machine 6 hours and 42 minutes.  Her residual AHI is 3.7 on 11 cm of water with EPR of 3.  Her leak in the 95th percentile is 31.3 L/min.  She currently has the nasal pillows but states that she would prefer the dream wear mask.  She states that there was some issue with her DME company that she cannot obtain this.  She states that her restless legs have been bothering her.  She states that she has not gotten much sleep in the last 2 nights.  She is currently taking Mirapex 0.5 mg at bedtime.  She is not taking half a tablet in the afternoon as instructed by Dr. Brett Fairy.  She states that she thought she was supposed to take Klonopin in the afternoons.  She returns today for follow-up.  REVIEW OF SYSTEMS: Out of a complete 14 system review of symptoms, the patient complains only of the following symptoms, and all other reviewed systems are negative.  See HPI  ALLERGIES: Allergies  Allergen Reactions  Turmeric Diarrhea    Severe diarrhea   Decadron [Dexamethasone]     Exacerbates restless leg    HOME MEDICATIONS: Outpatient Medications Prior to Visit  Medication Sig Dispense Refill   Ascorbic Acid (VITAMIN C) 500 MG CAPS Take 500 mg by mouth daily.     buPROPion (WELLBUTRIN XL) 150 MG 24 hr tablet Take 150 mg by mouth daily.  4   cholecalciferol (VITAMIN D3) 25 MCG (1000 UNIT) tablet Take 1,000 Units by mouth in the morning and at bedtime.     citalopram (CELEXA) 40 MG tablet Take 40 mg by mouth at bedtime.   3   clobetasol ointment (TEMOVATE) 8.09 % Apply 1 application topically 2 (two) times daily. (Patient taking differently: Apply 1 application  topically 2 (two) times daily as needed (irritation).) 30 g 1   clonazePAM (KLONOPIN) 1 MG tablet Take 1 mg by  mouth at bedtime.      docusate sodium (COLACE) 100 MG capsule Take 100 mg by mouth daily.     esomeprazole (NEXIUM) 40 MG capsule Take 40 mg by mouth 2 (two) times daily.     Ferrous Sulfate (IRON) 325 (65 Fe) MG TABS Take 325 mg by mouth daily.     fluticasone (FLONASE) 50 MCG/ACT nasal spray Place 1 spray into both nostrils daily as needed for allergies.     Multiple Vitamins-Minerals (MULTIVITAMIN WITH MINERALS) tablet Take 1 tablet by mouth at bedtime.     pramipexole (MIRAPEX) 0.5 MG tablet TAKE 1 TABLET (0.5 MG TOTAL) BY MOUTH DAILY AS NEEDED (TAKE ALONG WITH EXTENDED RELEASE DOSE) (Patient taking differently: Take 0.5 mg by mouth See admin instructions. 0.5 mg in the afternoon as needed, 0.5 mg at bedtime) 90 tablet 3   Pramipexole Dihydrochloride (MIRAPEX ER) 0.75 MG TB24 Take 1 tablet (0.75 mg total) by mouth daily. (Patient taking differently: Take 0.75 mg by mouth as needed.) 90 tablet 3   methocarbamol (ROBAXIN-750) 750 MG tablet Take 1 tablet (750 mg total) by mouth every 6 (six) hours as needed for muscle spasms. 90 tablet 2   oxyCODONE-acetaminophen (PERCOCET) 10-325 MG tablet Take 1 tablet by mouth every 4 (four) hours as needed for pain. 45 tablet 0   Facility-Administered Medications Prior to Visit  Medication Dose Route Frequency Provider Last Rate Last Admin   sodium chloride flush (NS) 0.9 % injection 10 mL  10 mL Intracatheter PRN Nicholas Lose, MD   10 mL at 07/19/16 1434    PAST MEDICAL HISTORY: Past Medical History:  Diagnosis Date   Anxiety    Arthritis    knees   Breast cancer ([place])    Breast cancer of upper-outer quadrant of left female breast ([place]) 03/20/2016   C2 cervical fracture ([place])    10/22/18, following fall from ladder   Cervicalgia    Cholecystitis    Complication of anesthesia    BP "crashes" post op   Depression    Early cataracts, bilateral    Family history of brain cancer    Family history of breast cancer    GERD (gastroesophageal reflux  disease)    Headache    " from my neck"   Hot flashes    Personal history of chemotherapy    Personal history of radiation therapy    Restless leg syndrome    Sleep apnea 10/03/2018   wears CPAP    PAST SURGICAL HISTORY: Past Surgical History:  Procedure Laterality Date   ANTERIOR CERVICAL DECOMP/DISCECTOMY FUSION N/A 01/22/2020  Procedure: ACDF - C2-C3;  Surgeon: Eustace Moore, MD;  Location: [facility];  Service: Neurosurgery;  Laterality: N/A;   BACK SURGERY     x2   BREAST BIOPSY     BREAST LUMPECTOMY Left 2018   BUNIONECTOMY     CHOLECYSTECTOMY N/A 10/04/2015   Procedure: LAPAROSCOPIC CHOLECYSTECTOMY WITH INTRAOPERATIVE CHOLANGIOGRAM;  Surgeon: Greer Pickerel, MD;  Location: [facility];  Service: General;  Laterality: N/A;   ELBOW SURGERY     right for epicondylitis 2010    ESOPHAGEAL MANOMETRY N/A 11/30/2020   Procedure: ESOPHAGEAL MANOMETRY (EM);  Surgeon: Ronnette Juniper, MD;  [facility];  Service: Gastroenterology;  Laterality: N/A;   FOOT SURGERY     1983 -tarsal tunnel release   IR GENERIC HISTORICAL  10/26/2016   IR CV LINE INJECTION 10/26/2016 [facility]   LUMBAR DISC SURGERY  04/04/2012   MASS EXCISION Left 02/06/2021   Procedure: EXCISION LEFT FOOT SOFT TISSUE BETWEEN SECOND AND THIRD AND THIRD AND FOURTH TOES;  Surgeon: Landis Martins, DPM;  Location: [facility];  Service: Podiatry;  Laterality: Left;   PORT-A-CATH REMOVAL N/A 11/15/2016   Procedure: REMOVAL PORT-A-CATH;  Surgeon: Rolm Bookbinder, MD;  Location: [facility];  Service: General;  Laterality: N/A;   PORTACATH PLACEMENT Right 04/02/2016   Procedure: INSERTION PORT-A-CATH WITH [place];  Surgeon: Rolm Bookbinder, MD;  Location: [facility];  Service: General;  Laterality: Right;   RADIOACTIVE SEED GUIDED PARTIAL MASTECTOMY/AXILLARY SENTINEL NODE BIOPSY/AXILLARY NODE DISSECTION Left 09/12/2016   Procedure: LEFT BREAST SEED GUIDED LUMPECTOMY, LEFT AXILLARY SENTINEL  NODE BIOPSY, LEFT SEED GUIDED AXILLARY NODE EXCISION( TARGETED AXILLARY DISSECTION), BLUE DYE INJECTION;  Surgeon: Rolm Bookbinder, MD;  Location: [facility];  Service: General;  Laterality: Left;   SACROILIAC JOINT FUSION Left 05/18/2020   Procedure: LEFT SACROILIAC JOINT FUSION;  Surgeon: Phylliss Bob, MD;  Location: [facility];  Service: Orthopedics;  Laterality: Left;   SPINAL FUSION  5/12   L5-S1   SPINAL FUSION  05/18/2019   L4-5   TARSAL TUNNEL RELEASE      FAMILY HISTORY: Family History  Problem Relation Age of Onset   Heart disease Mother    Pulmonary fibrosis Mother    Hypertension Mother    Sleep apnea Mother    Cancer Father 5       astocytoma   Hypertension Brother    Lymphoma Maternal Aunt    Anesthesia problems Neg Hx     SOCIAL HISTORY: Social History   Socioeconomic History   Marital status: Married    Spouse name: Not on file   Number of children: 2   Years of education: Not on file   Highest education level: Not on file  Occupational History   Not on file  Tobacco Use   Smoking status: Never   Smokeless tobacco: Never  Vaping Use   Vaping Use: Never used  Substance and Sexual Activity   Alcohol use: Yes    Alcohol/week: 1.0 standard drink of alcohol    Types: 1 Cans of beer per week    Comment: occasional   Drug use: No   Sexual activity: Not Currently    Birth control/protection: Post-menopausal  Other Topics Concern   Not on file  Social History Narrative   Not on file   Social Determinants of Health   Financial Resource Strain: Low Risk  (10/24/2018)   Overall Financial Resource Strain (CARDIA)    Difficulty of Paying Living Expenses: Not  hard at all  Food Insecurity: Unknown (08/24/2022)   Hunger Vital Sign    Worried About Running Out of Food in the Last Year: Patient refused    [place] in the Last Year: Patient refused  Transportation Needs: No Transportation Needs (10/04/2022)   PRAPARE - Armed forces logistics/support/administrative officer (Medical): No    Lack of Transportation (Non-Medical): No  Physical Activity: Unknown (10/24/2018)   Exercise Vital Sign    Days of Exercise per Week: 0 days    Minutes of Exercise per Session: Not on file  Stress: No Stress Concern Present (10/24/2018)   [place]    Feeling of Stress : Not at all  Social Connections: Not on file  Intimate Partner Violence: Unknown (08/24/2022)   Humiliation, Afraid, Rape, and Kick questionnaire    Fear of Current or Ex-Partner: Patient refused    Emotionally Abused: Patient refused    Physically Abused: Patient refused    Sexually Abused: Patient refused      PHYSICAL EXAM  Vitals:   10/15/22 1056  BP: 115/76  Pulse: 65  Weight: 205 lb (93 kg)  Height: '5\' 4"'$  (1.626 m)   Body mass index is 35.19 kg/m.     Generalized: Well developed, in no acute distress  Chest: Lungs clear to auscultation bilaterally  Neurological examination  Mentation: Alert oriented to time, place, history taking. Follows all commands speech and language fluent Cranial nerve II-XII: Extraocular movements were full, visual field were full on confrontational test Head turning and shoulder shrug  were normal and symmetric. Motor: The motor testing reveals 5 over 5 strength of all 4 extremities. Good symmetric motor tone is noted throughout.  Sensory: Sensory testing is intact to soft touch on all 4 extremities. No evidence of extinction is noted.  Gait and station: Gait is normal.    DIAGNOSTIC DATA (LABS, IMAGING, TESTING) - I reviewed patient records, labs, notes, testing and imaging myself where available.  Lab Results  Component Value Date   WBC 12.2 (H) 08/25/2022   HGB 10.6 (L) 08/25/2022   HCT 33.0 (L) 08/25/2022   MCV 95.7 08/25/2022   PLT 182 08/25/2022      Component Value Date/Time   NA 146 (H) 05/10/2020 1344   NA 139 05/27/2017 1126   K 4.0 05/10/2020  1344   K 4.0 05/27/2017 1126   CL 108 05/10/2020 1344   CO2 26 05/10/2020 1344   CO2 28 05/27/2017 1126   GLUCOSE 91 05/10/2020 1344   GLUCOSE 92 05/27/2017 1126   BUN 20 05/10/2020 1344   BUN 17.9 05/27/2017 1126   CREATININE 0.83 05/10/2020 1344   CREATININE 1.0 05/27/2017 1126   CALCIUM 9.5 05/10/2020 1344   CALCIUM 10.0 05/27/2017 1126   PROT 6.7 05/10/2020 1344   PROT 6.9 09/15/2018 1310   PROT 7.4 05/27/2017 1126   ALBUMIN 3.6 05/10/2020 1344   ALBUMIN 3.9 05/27/2017 1126   AST 22 05/10/2020 1344   AST 28 05/27/2017 1126   ALT 20 05/10/2020 1344   ALT 52 05/27/2017 1126   ALKPHOS 68 05/10/2020 1344   ALKPHOS 69 05/27/2017 1126   BILITOT 0.5 05/10/2020 1344   BILITOT 0.85 05/27/2017 1126   GFRNONAA >60 05/10/2020 1344   GFRAA >60 05/10/2020 1344    Lab Results  Component Value Date   TSH 2.680 09/15/2018      ASSESSMENT AND PLAN 66 y.o. year old female  has a past medical history of Anxiety, Arthritis, Breast cancer ([place]), Breast cancer of upper-outer quadrant of left female breast ([place]) (03/20/2016), C2 cervical fracture ([facility] [facility]), Cervicalgia, Cholecystitis, Complication of anesthesia, Depression, Early cataracts, bilateral, Family history of brain cancer, Family history of breast cancer, GERD (gastroesophageal reflux disease), Headache, Hot flashes, Personal history of chemotherapy, Personal history of radiation therapy, Restless leg syndrome, and Sleep apnea (10/03/2018). here with:  OSA on CPAP  - CPAP compliance excellent -Residual AHI is in normal range  - Discuss Inspire device  -Encouraged patient to continue using CPAP nightly and greater than 4 hours each night  2.  Restless leg syndrome   -Start Mirapex ER 0.375 mg daily  - Ok to use Mirapex immediate release 0.5 mg 1/2 tablet BID for breakthrough symptoms - F/U in 1 year or sooner if needed   I spent 25 minutes of face-to-face and non-face-to-face time with patient.  This included previsit chart  review, lab review, study review, order entry, electronic health record documentation, patient education.  Ward Givens, MSN, NP-C 10/15/2022, 11:26 AM [facility] [address] [place], [place] [address] [phone]

## 2022-10-16 NOTE — Progress Notes (Signed)
This encounter was created in error - please disregard.

## 2022-10-29 DIAGNOSIS — M545 Low back pain, unspecified: Secondary | ICD-10-CM | POA: Diagnosis not present

## 2022-10-29 DIAGNOSIS — M5416 Radiculopathy, lumbar region: Secondary | ICD-10-CM | POA: Diagnosis not present

## 2022-11-09 DIAGNOSIS — M5416 Radiculopathy, lumbar region: Secondary | ICD-10-CM | POA: Diagnosis not present

## 2022-11-09 DIAGNOSIS — M545 Low back pain, unspecified: Secondary | ICD-10-CM | POA: Diagnosis not present

## 2022-11-15 DIAGNOSIS — Z6835 Body mass index (BMI) 35.0-35.9, adult: Secondary | ICD-10-CM | POA: Diagnosis not present

## 2022-11-15 DIAGNOSIS — G4733 Obstructive sleep apnea (adult) (pediatric): Secondary | ICD-10-CM | POA: Diagnosis not present

## 2022-11-15 DIAGNOSIS — M5137 Other intervertebral disc degeneration, lumbosacral region: Secondary | ICD-10-CM | POA: Diagnosis not present

## 2022-11-15 DIAGNOSIS — E669 Obesity, unspecified: Secondary | ICD-10-CM | POA: Diagnosis not present

## 2022-11-19 DIAGNOSIS — H0102A Squamous blepharitis right eye, upper and lower eyelids: Secondary | ICD-10-CM | POA: Diagnosis not present

## 2022-11-19 DIAGNOSIS — H2512 Age-related nuclear cataract, left eye: Secondary | ICD-10-CM | POA: Diagnosis not present

## 2022-11-19 DIAGNOSIS — H0102B Squamous blepharitis left eye, upper and lower eyelids: Secondary | ICD-10-CM | POA: Diagnosis not present

## 2022-11-19 DIAGNOSIS — H40033 Anatomical narrow angle, bilateral: Secondary | ICD-10-CM | POA: Diagnosis not present

## 2022-11-19 DIAGNOSIS — H40013 Open angle with borderline findings, low risk, bilateral: Secondary | ICD-10-CM | POA: Diagnosis not present

## 2022-12-11 DIAGNOSIS — M5416 Radiculopathy, lumbar region: Secondary | ICD-10-CM | POA: Diagnosis not present

## 2022-12-11 DIAGNOSIS — Z6835 Body mass index (BMI) 35.0-35.9, adult: Secondary | ICD-10-CM | POA: Diagnosis not present

## 2022-12-20 DIAGNOSIS — H2512 Age-related nuclear cataract, left eye: Secondary | ICD-10-CM | POA: Diagnosis not present

## 2022-12-28 ENCOUNTER — Encounter: Payer: Self-pay | Admitting: Physician Assistant

## 2022-12-28 ENCOUNTER — Ambulatory Visit: Payer: Medicare PPO | Admitting: Physician Assistant

## 2022-12-28 ENCOUNTER — Ambulatory Visit (INDEPENDENT_AMBULATORY_CARE_PROVIDER_SITE_OTHER): Payer: Medicare PPO

## 2022-12-28 DIAGNOSIS — G8929 Other chronic pain: Secondary | ICD-10-CM | POA: Diagnosis not present

## 2022-12-28 DIAGNOSIS — M1711 Unilateral primary osteoarthritis, right knee: Secondary | ICD-10-CM | POA: Insufficient documentation

## 2022-12-28 DIAGNOSIS — M25561 Pain in right knee: Secondary | ICD-10-CM | POA: Diagnosis not present

## 2022-12-28 MED ORDER — LIDOCAINE HCL 1 % IJ SOLN
2.0000 mL | INTRAMUSCULAR | Status: AC | PRN
  Administered 2022-12-28: 2 mL

## 2022-12-28 MED ORDER — BUPIVACAINE HCL 0.25 % IJ SOLN
2.0000 mL | INTRAMUSCULAR | Status: AC | PRN
  Administered 2022-12-28: 2 mL via INTRA_ARTICULAR

## 2022-12-28 MED ORDER — MELOXICAM 7.5 MG PO TABS: 7.5000 mg | ORAL_TABLET | Freq: Every day | ORAL | 0 refills | Status: DC

## 2022-12-28 MED ORDER — METHYLPREDNISOLONE ACETATE 40 MG/ML IJ SUSP
80.0000 mg | INTRAMUSCULAR | Status: AC | PRN
  Administered 2022-12-28: 80 mg via INTRA_ARTICULAR

## 2022-12-28 NOTE — Progress Notes (Signed)
Office Visit Note   Patient: Alexis Fox           Date of Birth: 23-Dec-1955           MRN: [id number] Visit Date: 12/28/2022              Requested by: Alexis Orn, MD [address] [place],  [place] [address] PCP: Alexis Orn, MD   Assessment & Plan: Visit Diagnoses:  1. Chronic pain of right knee   2. Unilateral primary osteoarthritis, right knee     Plan: Alexis Fox is a pleasant 67 year old woman with a chief complaint of right medial knee pain.  Denies any specific injury.  Been going on for about 6 months but got worse last week.  Most of the pain is on the inside of her knee.  It is worse with weightbearing.  She has tried NSAIDs and a knee brace with minimal relief.  X-rays show that she does have osteoarthritis of her right knee most prominent in the medial compartment with sclerotic changes and periarticular osteophytes.  We discussed the natural history of this.  I showed her some close chain strengthening exercises.  She would also be amenable to trying a different anti-inflammatory we will call her in some meloxicam.  She is leaving on an extended trip.  So I mention trying a steroid injection which she like to do today.  If she got minimal relief she could contact me could consider viscosupplementation This patient is diagnosed with osteoarthritis of the knee(s).    Radiographs show evidence of joint space narrowing, osteophytes, subchondral sclerosis and/or subchondral cysts.  This patient has knee pain which interferes with functional and activities of daily living.    This patient has experienced inadequate response, adverse effects and/or intolerance with conservative treatments such as acetaminophen, NSAIDS, topical creams, physical therapy or regular exercise, knee bracing and/or weight loss.   This patient has experienced inadequate response or has a contraindication to intra articular steroid injections for at least 3 months.   This patient is  not scheduled to have a total knee replacement within 6 months of starting treatment with viscosupplementation.   Follow-Up Instructions: Return if symptoms worsen or fail to improve.   Orders:  Orders Placed This Encounter  Procedures   XR Knee 1-2 Views Right   No orders of the defined types were placed in this encounter.     Procedures: Large Joint Inj: R knee on 12/28/2022 2:12 PM Indications: pain and diagnostic evaluation Details: 25 G 1.5 in needle, anteromedial approach  Arthrogram: No  Medications: 80 mg methylPREDNISolone acetate 40 MG/ML; 2 mL lidocaine 1 %; 2 mL bupivacaine 0.25 % Outcome: tolerated well, no immediate complications Procedure, treatment alternatives, risks and benefits explained, specific risks discussed. Consent was given by the patient.     Clinical Data: No additional findings.   Subjective: Chief Complaint  Patient presents with   Right Knee - Pain    HPI pleasant 67 year old woman with 33-monthhistory of right medial knee pain.  No history of injury has tried NSAIDs and a knee brace without significant improvement.  Denies any locking or catching  Review of Systems  All other systems reviewed and are negative.    Objective: Vital Signs: There were no vitals taken for this visit.  Physical Exam Constitutional:      Appearance: Normal appearance.  Pulmonary:     Effort: Pulmonary effort is normal.  Skin:    General: Skin is  warm and dry.  Neurological:     General: No focal deficit present.     Mental Status: She is alert.     Ortho Exam Right knee no effusion no erythema no warmth.  Compartments are soft and nontender she is neurovascularly intact.  She does have some mild pain to weightbearing surface on the medial side of her knee.  No tenderness laterally.  Good varus valgus stability has a good endpoint on anterior and posterior draw. Specialty Comments:  No specialty comments available.  Imaging: No results  found.   PMFS History: Patient Active Problem List   Diagnosis Date Noted   Unilateral primary osteoarthritis, right knee 12/28/2022   Pain in left ankle and joints of left foot 09/13/2022   Hypersomnia, persistent 08/21/2022   Excessive daytime sleepiness 08/21/2022   MCI (mild cognitive impairment) 08/21/2022   History of breast cancer 08/21/2022   OSA on CPAP 08/21/2022   Pain in left shoulder 03/01/2021   Allergic rhinitis 02/21/2021   Anxiety 02/21/2021   Contact dermatitis due to plant 02/21/2021   Dysphagia 02/21/2021   Estrogen receptor negative status (ER-) 02/21/2021   Gastroesophageal reflux disease 02/21/2021   Gastroparesis 02/21/2021   Insomnia 02/21/2021   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[place]) 02/21/2021   Morton's neuroma of left foot 02/21/2021   Personal history of malignant neoplasm of breast 02/21/2021   Vitamin D deficiency 02/21/2021   Oth nondisplaced dens fracture, init for clos fx ([place]) 02/04/2020   S/P cervical spinal fusion 01/22/2020   Memory deficit 01/14/2020   Sacroiliac dysfunction 01/14/2020   Neck pain 11/05/2019   Body mass index (BMI) 25.0-25.9, adult 11/05/2019   Unilateral primary osteoarthritis, right hip 09/01/2019   Trochanteric bursitis, left hip 07/14/2019   S/P lumbar fusion 05/18/2019   OSA (obstructive sleep apnea) 10/28/2018   Pain managed using patient-controlled analgesia (PCA) 10/25/2018   Fall 10/24/2018   Fall from ladder 10/23/2018   DDD (degenerative disc disease), lumbosacral 09/15/2018   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   Central line complication 00/76/2263   Genetic testing 05/15/2016   Family history of breast cancer    Family history of brain cancer    Breast cancer of upper-outer quadrant of left female breast ([place]) 03/20/2016   Transaminitis 10/03/2015   Symptomatic cholelithiasis 10/03/2015   RUQ abdominal pain    Lumbosacral neuritis 11/10/2014   Lumbar radiculopathy 10/19/2014    Depression 06/24/2013   RLS (restless legs syndrome) 06/24/2013   Low back pain 05/19/2013   Past Medical History:  Diagnosis Date   Anxiety    Arthritis    knees   Breast cancer ([place])    Breast cancer of upper-outer quadrant of left female breast ([place]) 03/20/2016   C2 cervical fracture ([place])    10/22/18, following fall from ladder   Cervicalgia    Cholecystitis    Complication of anesthesia    BP "crashes" post op   Depression    Early cataracts, bilateral    Family history of brain cancer    Family history of breast cancer    GERD (gastroesophageal reflux disease)    Headache    " from my neck"   Hot flashes    Personal history of chemotherapy    Personal history of radiation therapy    Restless leg syndrome    Sleep apnea 10/03/2018   wears CPAP    Family History  Problem Relation Age of Onset   Heart disease Mother    Pulmonary fibrosis  Mother    Hypertension Mother    Sleep apnea Mother    Cancer Father 58       astocytoma   Hypertension Brother    Lymphoma Maternal Aunt    Anesthesia problems Neg Hx     Past Surgical History:  Procedure Laterality Date   ANTERIOR CERVICAL DECOMP/DISCECTOMY FUSION N/A 01/22/2020   Procedure: ACDF - C2-C3;  Surgeon: Eustace Moore, MD;  Location: [facility];  Service: Neurosurgery;  Laterality: N/A;   BACK SURGERY     x2   BREAST BIOPSY     BREAST LUMPECTOMY Left 2018   BUNIONECTOMY     CHOLECYSTECTOMY N/A 10/04/2015   Procedure: LAPAROSCOPIC CHOLECYSTECTOMY WITH INTRAOPERATIVE CHOLANGIOGRAM;  Surgeon: Greer Pickerel, MD;  Location: [facility];  Service: General;  Laterality: N/A;   ELBOW SURGERY     right for epicondylitis 2010    ESOPHAGEAL MANOMETRY N/A 11/30/2020   Procedure: ESOPHAGEAL MANOMETRY (EM);  Surgeon: Ronnette Juniper, MD;  [facility];  Service: Gastroenterology;  Laterality: N/A;   FOOT SURGERY     1983 -tarsal tunnel release   IR GENERIC HISTORICAL  10/26/2016   IR CV LINE INJECTION 10/26/2016 [facility]    LUMBAR DISC SURGERY  04/04/2012   MASS EXCISION Left 02/06/2021   Procedure: EXCISION LEFT FOOT SOFT TISSUE BETWEEN SECOND AND THIRD AND THIRD AND FOURTH TOES;  Surgeon: Landis Martins, DPM;  Location: [facility];  Service: Podiatry;  Laterality: Left;   PORT-A-CATH REMOVAL N/A 11/15/2016   Procedure: REMOVAL PORT-A-CATH;  Surgeon: Rolm Bookbinder, MD;  Location: [facility];  Service: General;  Laterality: N/A;   PORTACATH PLACEMENT Right 04/02/2016   Procedure: INSERTION PORT-A-CATH WITH [place];  Surgeon: Rolm Bookbinder, MD;  Location: [facility];  Service: General;  Laterality: Right;   RADIOACTIVE SEED GUIDED PARTIAL MASTECTOMY/AXILLARY SENTINEL NODE BIOPSY/AXILLARY NODE DISSECTION Left 09/12/2016   Procedure: LEFT BREAST SEED GUIDED LUMPECTOMY, LEFT AXILLARY SENTINEL NODE BIOPSY, LEFT SEED GUIDED AXILLARY NODE EXCISION( TARGETED AXILLARY DISSECTION), BLUE DYE INJECTION;  Surgeon: Rolm Bookbinder, MD;  Location: [facility];  Service: General;  Laterality: Left;   SACROILIAC JOINT FUSION Left 05/18/2020   Procedure: LEFT SACROILIAC JOINT FUSION;  Surgeon: Phylliss Bob, MD;  Location: [facility];  Service: Orthopedics;  Laterality: Left;   SPINAL FUSION  5/12   L5-S1   SPINAL FUSION  05/18/2019   L4-5   TARSAL TUNNEL RELEASE     Social History   Occupational History   Not on file  Tobacco Use   Smoking status: Never   Smokeless tobacco: Never  Vaping Use   Vaping Use: Never used  Substance and Sexual Activity   Alcohol use: Yes    Alcohol/week: 1.0 standard drink of alcohol    Types: 1 Cans of beer per week    Comment: occasional   Drug use: No   Sexual activity: Not Currently    Birth control/protection: Post-menopausal

## 2023-02-04 ENCOUNTER — Ambulatory Visit: Payer: Medicare PPO | Admitting: Neurology

## 2023-03-07 ENCOUNTER — Ambulatory Visit: Payer: Medicare PPO | Admitting: Psychology

## 2023-03-18 ENCOUNTER — Encounter: Payer: Self-pay | Admitting: Neurology

## 2023-03-18 ENCOUNTER — Ambulatory Visit: Payer: Medicare PPO | Admitting: Adult Health

## 2023-03-21 NOTE — Telephone Encounter (Signed)
Received the ONO report from [facility] after reaching out to inquire about this. Reviewed the Dr Vickey Huger and there is no need to move forward assessing need for oxygen as the study showed oxygen level only dropped as low as 90%

## 2023-03-23 DIAGNOSIS — G4733 Obstructive sleep apnea (adult) (pediatric): Secondary | ICD-10-CM | POA: Diagnosis not present

## 2023-03-25 ENCOUNTER — Other Ambulatory Visit: Payer: Self-pay | Admitting: Neurology

## 2023-03-25 MED ORDER — PRAMIPEXOLE DIHYDROCHLORIDE ER 0.75 MG PO TB24: 0.7500 mg | ORAL_TABLET | Freq: Every day | ORAL | 1 refills | Status: DC

## 2023-03-28 ENCOUNTER — Telehealth: Payer: Self-pay | Admitting: Physician Assistant

## 2023-03-28 NOTE — Telephone Encounter (Signed)
Received call from patient. She is in [place] and needs medical records for another doctor. I emailed authorization form to patient per her request to [email or web address]. She will complete and return to me. She also will need the xrays on CD to be mailed.

## 2023-04-10 DIAGNOSIS — M25562 Pain in left knee: Secondary | ICD-10-CM | POA: Diagnosis not present

## 2023-04-10 DIAGNOSIS — M1712 Unilateral primary osteoarthritis, left knee: Secondary | ICD-10-CM | POA: Diagnosis not present

## 2023-05-01 DIAGNOSIS — M1711 Unilateral primary osteoarthritis, right knee: Secondary | ICD-10-CM | POA: Diagnosis not present

## 2023-05-01 DIAGNOSIS — M25561 Pain in right knee: Secondary | ICD-10-CM | POA: Diagnosis not present

## 2023-05-15 ENCOUNTER — Encounter: Payer: Self-pay | Admitting: Neurology

## 2023-05-29 DIAGNOSIS — M170 Bilateral primary osteoarthritis of knee: Secondary | ICD-10-CM | POA: Diagnosis not present

## 2023-05-29 DIAGNOSIS — M25562 Pain in left knee: Secondary | ICD-10-CM | POA: Diagnosis not present

## 2023-05-29 DIAGNOSIS — M25561 Pain in right knee: Secondary | ICD-10-CM | POA: Diagnosis not present

## 2023-07-07 ENCOUNTER — Encounter: Payer: Self-pay | Admitting: Neurology

## 2023-07-08 MED ORDER — PRAMIPEXOLE DIHYDROCHLORIDE ER 0.75 MG PO TB24: 0.7500 mg | ORAL_TABLET | Freq: Every day | ORAL | 0 refills | Status: DC

## 2023-07-11 ENCOUNTER — Other Ambulatory Visit: Payer: Self-pay | Admitting: Internal Medicine

## 2023-07-11 DIAGNOSIS — Z1231 Encounter for screening mammogram for malignant neoplasm of breast: Secondary | ICD-10-CM

## 2023-07-15 ENCOUNTER — Encounter: Payer: Medicare PPO | Attending: Psychology | Admitting: Psychology

## 2023-07-15 DIAGNOSIS — R413 Other amnesia: Secondary | ICD-10-CM | POA: Insufficient documentation

## 2023-07-15 DIAGNOSIS — G3184 Mild cognitive impairment, so stated: Secondary | ICD-10-CM | POA: Diagnosis not present

## 2023-07-15 DIAGNOSIS — F5101 Primary insomnia: Secondary | ICD-10-CM | POA: Diagnosis not present

## 2023-07-15 DIAGNOSIS — G4733 Obstructive sleep apnea (adult) (pediatric): Secondary | ICD-10-CM | POA: Diagnosis not present

## 2023-07-16 ENCOUNTER — Encounter: Payer: Self-pay | Admitting: Psychology

## 2023-07-16 NOTE — Progress Notes (Signed)
Neuropsychological Consultation   Patient:   Alexis Fox   DOB:   [birth year]  MR Number:  [id number]  Location:  [facility] PHYSICAL MEDICINE & REHABILITATION [address] [place], [place] [address] [place] [address] Dept: [phone]           Date of Service:   07/15/2023  Location of Service and Individuals present: Today's visit was conducted in my outpatient clinic office with the patient and myself present.  Start Time:   1 PM End Time:   3 PM  Today's visit was 2 hours total.  1 hour and 15 minutes was spent in face-to-face clinical interview and the other 45 minutes was spent with records review, report writing and setting up testing protocols.  Patient Consent and Confidentiality: Review was made with the patient regarding limits of confidentiality including the fact that the patient had been referred for neuropsychological evaluation by her neurologist Dr. Vickey Huger due to the patient's reports and complaints of ongoing memory difficulties and this formal neuropsychological evaluation will be provided to Dr. Vickey Huger as well as being made available in the patient's electronic medical records.  Patient consents to proceed with the evaluation.  Consent for Evaluation and Treatment:  Signed:  Yes Explanation of Privacy Policies:  Signed:  Yes Discussion of Confidentiality Limits:  Yes  Provider/Observer:  Arley Phenix, Psy.D.       Clinical Neuropsychologist       Billing Code/Service: [phone]  Chief Complaint:     Chief Complaint  Patient presents with   Memory Loss   Anxiety    Reason for Service:    Alexis Fox is a 67 year old female referred for neuropsychological evaluation by her treating neurologist Melvyn Novas, MD due to ongoing memory difficulties and cognitive changes that developed after her chemotherapy in 2017.  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  Patient has been using a nasal cradle mask.  Patient is struggled with persistent sleepiness and fatigue which did not particularly improve with CPAP use.  Patient is a cancer survivor having gone through chemotherapy for breast cancer.  Patient has described ongoing memory difficulties and changes in cognitive capacity along with her excessive daytime sleepiness.  Patient is compliant with her CPAP but has endorsed an Epworth score of 18/24 even with this compliance.  Patient also has anxiety and depressive features and an ongoing fear of her cancer returning.  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  She retired after her breast cancer diagnosis and treatment in 2017.  Patient continues to complain of memory loss, episodes of confusion, headaches, insomnia.  Insomnia has been persistent since 1998 and continues with excessive daytime sleepiness and onset of restless leg symptoms starting in late afternoon early evening.  The patient has not previously been formally diagnosed with an anxiety disorder there are concerns around PTSD/anxiety associated with her diagnosis and treatment of breast cancer and lumpectomy in 2017.  Patient has also had fusion back surgery L5-S1 in 2013.  Patient was prescribed Wellbutrin and Celexa starting in roughly 2017 and patient continues to take Celexa now.  The patient reports that she is continued to have memory deficits and reports that she depends on her husband for some basic recall and memory.  She describes both short-term and long-term memory changes.  Patient reports that she began having  memory difficulties in 2017 after being diagnosed and treated for breast cancer.  Patient describes more memory issues with semantic memory versus episodic memory.  Patient reports that cueing does tend to help with her recall.  Patient does not describe any cognitive  issues or deficits outside of attentional/memory issues  And ongoing issues associated with geographic disorientation and visual-spatial changes.  Patient reports that these difficulties have been rather stable and not progressively worsening.  Patient reports that during her diagnosis and treatment for breast cancer that she was initially unable to care for herself and her husband managed her medications and she was dependent upon a "map" program (I assume GPS) to find her way around when outside of home.  The patient is able to manage her own meds now and manage her health care.  She is able to navigate locally with assistance.  Patient reports that her family wavers between understanding her difficulty and frustration with her reduced functioning.  Medical History:   Past Medical History:  Diagnosis Date   Anxiety    Arthritis    knees   Breast cancer (HCC)    Breast cancer of upper-outer quadrant of left female breast (HCC) 03/20/2016   C2 cervical fracture (HCC)    10/22/18, following fall from ladder   Cervicalgia    Cholecystitis    Complication of anesthesia    BP "crashes" post op   Depression    Early cataracts, bilateral    Family history of brain cancer    Family history of breast cancer    GERD (gastroesophageal reflux disease)    Headache    " from my neck"   Hot flashes    Personal history of chemotherapy    Personal history of radiation therapy    Restless leg syndrome    Sleep apnea 10/03/2018   wears CPAP         Patient Active Problem List   Diagnosis Date Noted   Unilateral primary osteoarthritis, right knee 12/28/2022   Pain in left ankle and joints of left foot 09/13/2022   Hypersomnia, persistent 08/21/2022   Excessive daytime sleepiness 08/21/2022   MCI (mild cognitive impairment) 08/21/2022   History of breast cancer 08/21/2022   OSA on CPAP 08/21/2022   Pain in left shoulder 03/01/2021   Allergic rhinitis 02/21/2021   Anxiety 02/21/2021   Contact  dermatitis due to plant 02/21/2021   Dysphagia 02/21/2021   Estrogen receptor negative status (ER-) 02/21/2021   Gastroesophageal reflux disease 02/21/2021   Gastroparesis 02/21/2021   Insomnia 02/21/2021   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   Morton's neuroma of left foot 02/21/2021   Personal history of malignant neoplasm of breast 02/21/2021   Vitamin D deficiency 02/21/2021   Oth nondisplaced dens fracture, init for clos fx (HCC) 02/04/2020   S/P cervical spinal fusion 01/22/2020   Memory deficit 01/14/2020   Sacroiliac dysfunction 01/14/2020   Neck pain 11/05/2019   Body mass index (BMI) 25.0-25.9, adult 11/05/2019   Unilateral primary osteoarthritis, right hip 09/01/2019   Trochanteric bursitis, left hip 07/14/2019   S/P lumbar fusion 05/18/2019   OSA (obstructive sleep apnea) 10/28/2018   Pain managed using patient-controlled analgesia (PCA) 10/25/2018   Fall 10/24/2018   Fall from ladder 10/23/2018   DDD (degenerative disc disease), lumbosacral 09/15/2018   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   Central line complication 10/22/2016   Genetic testing 05/15/2016   Family history of breast cancer    Family history of brain cancer  Breast cancer of upper-outer quadrant of left female breast (HCC) 03/20/2016   Transaminitis 10/03/2015   Symptomatic cholelithiasis 10/03/2015   RUQ abdominal pain    Lumbosacral neuritis 11/10/2014   Lumbar radiculopathy 10/19/2014   Depression 06/24/2013   RLS (restless legs syndrome) 06/24/2013   Low back pain 05/19/2013   Additional Tests and Measures from other records:  Neuroimaging Results: Patient had a MRI with and without contrast of brain conducted on 08/23/2022 and interpreted by Despina Arias, MD with comparisons to CT head in 2020.  This was due to her reports of mild cognitive difficulties and memory difficulties with a history of hypersomnia and history of breast cancer with chemotherapy.   There were noted acute findings with cerebellar and brainstem appearing normal and deep gray matter appearing normal.  Cerebral hemispheres appeared normal for age with just a couple punctuate T2/FLAIR hyperintense foci in the subcortical or deep white matter regions.  These were indicative of minimal chronic microvascular ischemic changes and typical for age.  Sleep: Patient has a long history of insomnia and restless leg syndrome and some REM behavioral disorder along with her obstructive sleep apnea diagnosis.  Behavioral Observation/Mental Status:   Alexis Fox  presents as a 67 y.o.-year-old Right handed Caucasian Female who appeared her stated age. her dress was Appropriate and she was Well Groomed and her manners were Appropriate to the situation.  her participation was indicative of Appropriate and Redirectable behaviors.  There were not physical disabilities noted.  she displayed an appropriate level of cooperation and motivation.    Interactions:    Active Redirectable  Attention:   abnormal and attention span appeared shorter than expected for age  Memory:   abnormal; remote memory intact, recent memory impaired  Visuo-spatial:   not examined  Speech (Volume):  low  Speech:   normal; normal  Thought Process:  Coherent and Relevant  Coherent, Directed, Logical, and Organized  Though Content:  WNL; not suicidal and not homicidal  Orientation:   person, place, time/date, and situation  Judgment:   Good  Planning:   Fair  Affect:    Anxious  Mood:    Anxious  Insight:   Good  Intelligence:   high  Marital Status/Living:  The patient was born and raised in [place] along with 3 siblings.  No significant developmental issues were noted and normal childhood illnesses were noted.  The patient continues to live with her husband of 49 years and they have 2 adult children who have dealt with issues such as depression and bipolar disorder and substance  abuse.  Educational and Occupational History:     Highest Level of Education:   Patient graduated [employer] with her undergraduate degree in nursing Nature conservation officer) and also took masters level classes and nursing maintaining a very good GPA.  Patient is also obtained various clinical certifications and is a certified diabetes educator.  Current Occupation:    The patient is a retired Engineer, civil (consulting).  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  Hobbies and Interests: Quilting and working on learning to play the fiddle.  Psychiatric History: The patient has dealt with anxiety and some depressive symptoms that really were exacerbated during her diagnosis and treatment for breast cancer.   History of Substance Use or Abuse:  No concerns of substance abuse are reported.  Mental Health Hospitalizations:  No   Family Med/Psych History:  Family History  Problem Relation Age of Onset   Heart disease Mother    Pulmonary fibrosis Mother    Hypertension Mother    Sleep apnea Mother    Cancer Father 33       astocytoma   Hypertension Brother    Lymphoma Maternal Aunt    Anesthesia problems Neg Hx     Impression/DX:   Alexis Fox is a 67 year old female referred for neuropsychological evaluation by her treating neurologist Melvyn Novas, MD due to ongoing memory difficulties and cognitive changes that developed after her chemotherapy in 2017.  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  Patient has been using a nasal cradle mask.  Patient is struggled with persistent sleepiness and fatigue which did not particularly improve with CPAP use.  Patient is a cancer survivor having gone through chemotherapy for breast cancer.  Patient has described  ongoing memory difficulties and changes in cognitive capacity along with her excessive daytime sleepiness.  Patient is compliant with her CPAP but has endorsed an Epworth score of 18/24 even with this compliance.  Patient also has anxiety and depressive features and an ongoing fear of her cancer returning.  Disposition/Plan:  We have set the patient up for formal neuropsychological evaluation and she will be evaluated on 2 separate dates with the first 1 consisting of the Wechsler Adult Intelligence Scale and Wechsler Memory Scale's and the second to our visit will be to get a deeper review of attentional issues utilizing the comprehensive attention battery and CAB CPT measures.  Once this is completed a formal neuropsychological evaluation will be produced and made available to her referring neurologist and placed in her electronic medical records for other appropriate medical providers to have access.  I will also schedule a visit to sit down with the patient reviewed the findings and go over in depth recommendations for specific strategies for the patient.  Diagnosis:    MCI (mild cognitive impairment)  Memory deficit  OSA on CPAP  Primary insomnia        Note: This document was prepared using Dragon voice recognition software and may include unintentional dictation errors.   Electronically Signed   _______________________ Arley Phenix, Psy.D. Clinical Neuropsychologist

## 2023-07-17 ENCOUNTER — Encounter: Payer: Self-pay | Admitting: Orthopedic Surgery

## 2023-07-17 ENCOUNTER — Ambulatory Visit: Payer: Medicare PPO | Admitting: Orthopedic Surgery

## 2023-07-17 VITALS — Ht 64.0 in | Wt 208.0 lb

## 2023-07-17 DIAGNOSIS — M1711 Unilateral primary osteoarthritis, right knee: Secondary | ICD-10-CM | POA: Diagnosis not present

## 2023-07-17 NOTE — Progress Notes (Unsigned)
Office Visit Note   Patient: Alexis Fox           Date of Birth: [birth year]           MRN: [id number] Visit Date: 07/17/2023 Requested by: No referring provider defined for this encounter. PCP: Kirby Funk, MD (Inactive)  Subjective: Chief Complaint  Patient presents with   Right Knee - Pain    HPI: Alexis Fox is a 67 y.o. female who presents to the office reporting bilateral knee pain right worse than left.  Patient is a retired Engineer, civil (consulting).  She had knee injection 12/28/2022 which helped but only with the knee clicking.  Her pain has recurred.  After that knee injection in January she went to [place] and just returned last week.  While in [place] all she had cortisone injection on May 29 on the right-hand side and gel injection on the 26.  Those injections really did not help with the pain of her ambulation.  She reports constant pain with weakness giving way and stiffness.  The pain does wake her from sleep at night.  Uses a brace when she is out away from home.  The gel did decrease some of the mechanical symptoms but her pain symptoms have persisted.  She has less than 1 block of walking endurance.  She states her left knee is "an issue but tolerable".  Denies any groin pain or back pain.  Does use CPAP machine at home.  Radiographs from January do show significant tricompartmental arthritis worse in the medial side.  More recent x-rays from a month ago do show some progressive joint space narrowing on that medial side with no evidence of stress reaction or stress fracture..                ROS: All systems reviewed are negative as they relate to the chief complaint within the history of present illness.  Patient denies fevers or chills.  Assessment & Plan: Visit Diagnoses:  1. Unilateral primary osteoarthritis, right knee     Plan: Impression is end-stage right knee arthritis.  After long discussion with Alexis Fox and her husband today about the risk and benefits of knee  replacement she would like to proceed.  She understands the risk including but not limited to infection nerve and vessel damage knee stiffness incomplete pain relief as well as incomplete restoration of function.  The grueling nature of the rehabilitative process is also discussed.  All questions answered.  Follow-Up Instructions: No follow-ups on file.   Orders:  No orders of the defined types were placed in this encounter.  No orders of the defined types were placed in this encounter.     Procedures: No procedures performed   Clinical Data: No additional findings.  Objective: Vital Signs: Ht 5\' 4"  (1.626 m)   Wt 208 lb (94.3 kg)   BMI 35.70 kg/m   Physical Exam:  Constitutional: Patient appears well-developed HEENT:  Head: Normocephalic Eyes:EOM are normal Neck: Normal range of motion Cardiovascular: Normal rate Pulmonary/chest: Effort normal Neurologic: Patient is alert Skin: Skin is warm Psychiatric: Patient has normal mood and affect  Ortho Exam: Ortho exam demonstrates slight varus alignment in that right leg.  Pedal pulses palpable.  Patient has 5 out of 5 ankle dorsiflexion plantarflexion quad and hamstring strength.  Range of motion is about 0-1 20.  Has medial greater than lateral joint line tenderness with significant patellofemoral crepitus as well as stable collateral and cruciate ligaments.  Trace effusion is  present in the right knee.  No effusion in the left knee.  Specialty Comments:  No specialty comments available.  Imaging: No results found.   PMFS History: Patient Active Problem List   Diagnosis Date Noted   Unilateral primary osteoarthritis, right knee 12/28/2022   Pain in left ankle and joints of left foot 09/13/2022   Hypersomnia, persistent 08/21/2022   Excessive daytime sleepiness 08/21/2022   MCI (mild cognitive impairment) 08/21/2022   History of breast cancer 08/21/2022   OSA on CPAP 08/21/2022   Pain in left shoulder 03/01/2021    Allergic rhinitis 02/21/2021   Anxiety 02/21/2021   Contact dermatitis due to plant 02/21/2021   Dysphagia 02/21/2021   Estrogen receptor negative status (ER-) 02/21/2021   Gastroesophageal reflux disease 02/21/2021   Gastroparesis 02/21/2021   Insomnia 02/21/2021   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   Morton's neuroma of left foot 02/21/2021   Personal history of malignant neoplasm of breast 02/21/2021   Vitamin D deficiency 02/21/2021   Oth nondisplaced dens fracture, init for clos fx (HCC) 02/04/2020   S/P cervical spinal fusion 01/22/2020   Memory deficit 01/14/2020   Sacroiliac dysfunction 01/14/2020   Neck pain 11/05/2019   Body mass index (BMI) 25.0-25.9, adult 11/05/2019   Unilateral primary osteoarthritis, right hip 09/01/2019   Trochanteric bursitis, left hip 07/14/2019   S/P lumbar fusion 05/18/2019   OSA (obstructive sleep apnea) 10/28/2018   Pain managed using patient-controlled analgesia (PCA) 10/25/2018   Fall 10/24/2018   Fall from ladder 10/23/2018   DDD (degenerative disc disease), lumbosacral 09/15/2018   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   Central line complication 10/22/2016   Genetic testing 05/15/2016   Family history of breast cancer    Family history of brain cancer    Breast cancer of upper-outer quadrant of left female breast (HCC) 03/20/2016   Transaminitis 10/03/2015   Symptomatic cholelithiasis 10/03/2015   RUQ abdominal pain    Lumbosacral neuritis 11/10/2014   Lumbar radiculopathy 10/19/2014   Depression 06/24/2013   RLS (restless legs syndrome) 06/24/2013   Low back pain 05/19/2013   Past Medical History:  Diagnosis Date   Anxiety    Arthritis    knees   Breast cancer (HCC)    Breast cancer of upper-outer quadrant of left female breast (HCC) 03/20/2016   C2 cervical fracture (HCC)    10/22/18, following fall from ladder   Cervicalgia    Cholecystitis    Complication of anesthesia    BP "crashes"  post op   Depression    Early cataracts, bilateral    Family history of brain cancer    Family history of breast cancer    GERD (gastroesophageal reflux disease)    Headache    " from my neck"   Hot flashes    Personal history of chemotherapy    Personal history of radiation therapy    Restless leg syndrome    Sleep apnea 10/03/2018   wears CPAP    Family History  Problem Relation Age of Onset   Heart disease Mother    Pulmonary fibrosis Mother    Hypertension Mother    Sleep apnea Mother    Cancer Father 41       astocytoma   Hypertension Brother    Lymphoma Maternal Aunt    Anesthesia problems Neg Hx     Past Surgical History:  Procedure Laterality Date   ANTERIOR CERVICAL DECOMP/DISCECTOMY FUSION N/A 01/22/2020   Procedure: ACDF - C2-C3;  Surgeon:  Tia Alert, MD;  Location: [facility];  Service: Neurosurgery;  Laterality: N/A;   BACK SURGERY     x2   BREAST BIOPSY     BREAST LUMPECTOMY Left 2018   BUNIONECTOMY     CHOLECYSTECTOMY N/A 10/04/2015   Procedure: LAPAROSCOPIC CHOLECYSTECTOMY WITH INTRAOPERATIVE CHOLANGIOGRAM;  Surgeon: Gaynelle Adu, MD;  Location: [facility] OR;  Service: General;  Laterality: N/A;   ELBOW SURGERY     right for epicondylitis 2010    ESOPHAGEAL MANOMETRY N/A 11/30/2020   Procedure: ESOPHAGEAL MANOMETRY (EM);  Surgeon: Kerin Salen, MD;  Location: [facility];  Service: Gastroenterology;  Laterality: N/A;   FOOT SURGERY     1983 -tarsal tunnel release   IR GENERIC HISTORICAL  10/26/2016   IR CV LINE INJECTION 10/26/2016 [facility]-INTERV RAD   LUMBAR DISC SURGERY  04/04/2012   MASS EXCISION Left 02/06/2021   Procedure: EXCISION LEFT FOOT SOFT TISSUE BETWEEN SECOND AND THIRD AND THIRD AND FOURTH TOES;  Surgeon: Asencion Islam, DPM;  Location: [facility];  Service: Podiatry;  Laterality: Left;   PORT-A-CATH REMOVAL N/A 11/15/2016   Procedure: REMOVAL PORT-A-CATH;  Surgeon: Emelia Loron, MD;  Location: [facility];  Service:  General;  Laterality: N/A;   PORTACATH PLACEMENT Right 04/02/2016   Procedure: INSERTION PORT-A-CATH WITH [place];  Surgeon: Emelia Loron, MD;  Location: [facility];  Service: General;  Laterality: Right;   RADIOACTIVE SEED GUIDED PARTIAL MASTECTOMY/AXILLARY SENTINEL NODE BIOPSY/AXILLARY NODE DISSECTION Left 09/12/2016   Procedure: LEFT BREAST SEED GUIDED LUMPECTOMY, LEFT AXILLARY SENTINEL NODE BIOPSY, LEFT SEED GUIDED AXILLARY NODE EXCISION( TARGETED AXILLARY DISSECTION), BLUE DYE INJECTION;  Surgeon: Emelia Loron, MD;  Location: [facility];  Service: General;  Laterality: Left;   SACROILIAC JOINT FUSION Left 05/18/2020   Procedure: LEFT SACROILIAC JOINT FUSION;  Surgeon: Estill Bamberg, MD;  Location: [facility] OR;  Service: Orthopedics;  Laterality: Left;   SPINAL FUSION  5/12   L5-S1   SPINAL FUSION  05/18/2019   L4-5   TARSAL TUNNEL RELEASE     Social History   Occupational History   Not on file  Tobacco Use   Smoking status: Never   Smokeless tobacco: Never  Vaping Use   Vaping status: Never Used  Substance and Sexual Activity   Alcohol use: Yes    Alcohol/week: 1.0 standard drink of alcohol    Types: 1 Cans of beer per week    Comment: occasional   Drug use: No   Sexual activity: Not Currently    Birth control/protection: Post-menopausal

## 2023-07-18 ENCOUNTER — Ambulatory Visit: Payer: Medicare PPO

## 2023-07-19 ENCOUNTER — Encounter: Payer: Self-pay | Admitting: Hematology and Oncology

## 2023-07-19 ENCOUNTER — Ambulatory Visit
Admission: RE | Admit: 2023-07-19 | Discharge: 2023-07-19 | Disposition: A | Discharge status: Home / Self Care | Payer: Medicare PPO | Source: Ambulatory Visit | Attending: Internal Medicine | Admitting: Internal Medicine

## 2023-07-19 DIAGNOSIS — Z1231 Encounter for screening mammogram for malignant neoplasm of breast: Secondary | ICD-10-CM | POA: Diagnosis not present

## 2023-07-22 ENCOUNTER — Ambulatory Visit: Payer: Medicare PPO | Admitting: Neurology

## 2023-07-22 ENCOUNTER — Encounter: Payer: Self-pay | Admitting: Neurology

## 2023-07-22 VITALS — BP 121/70 | HR 71 | Ht 64.0 in | Wt 207.8 lb

## 2023-07-22 DIAGNOSIS — G4733 Obstructive sleep apnea (adult) (pediatric): Secondary | ICD-10-CM | POA: Diagnosis not present

## 2023-07-22 DIAGNOSIS — G3184 Mild cognitive impairment, so stated: Secondary | ICD-10-CM | POA: Diagnosis not present

## 2023-07-22 DIAGNOSIS — G2581 Restless legs syndrome: Secondary | ICD-10-CM

## 2023-07-22 DIAGNOSIS — Z853 Personal history of malignant neoplasm of breast: Secondary | ICD-10-CM

## 2023-07-22 NOTE — Patient Instructions (Signed)
Mild Neurocognitive Disorder Mild neurocognitive disorder, formerly known as mild cognitive impairment, is a disorder in which memory does not work as well as it should. This disorder may also cause problems with other mental functions, including thought, communication, behavior, and completion of tasks. These problems can be noticed and measured, but they usually do not interfere with daily activities or the ability to live independently. Mild neurocognitive disorder typically develops after 68 years of age, but it can also develop at younger ages. It is not as serious as major neurocognitive disorder, also known as dementia, but it may be the first sign of it. Generally, symptoms of this condition get worse over time. In rare cases, symptoms can get better. What are the causes? This condition may be caused by: Brain disorders like Alzheimer's disease, Parkinson's disease, and other conditions that gradually damage nerve cells (neurodegenerative conditions). Diseases that affect blood vessels in the brain and result in small strokes. Certain infections, such as HIV. Traumatic brain injury. Other medical conditions, such as brain tumors, underactive thyroid (hypothyroidism), and vitamin B12 deficiency. Use of certain drugs or prescription medicines. What increases the risk? The following factors may make you more likely to develop this condition: Being older than 65 years. Being female. Low education level. Diabetes, high blood pressure, high cholesterol, and other conditions that increase the risk for blood vessel diseases. Untreated or undertreated sleep apnea. Having a certain type of gene that can be passed from parent to child (inherited). Chronic health problems such as heart disease, lung disease, liver disease, kidney disease, or depression. What are the signs or symptoms? Symptoms of this condition include: Difficulty remembering. You may: Forget names, phone numbers, or details of  recent events. Forget social events and appointments. Repeatedly forget where you put your car keys or other items. Difficulty thinking and solving problems. You may have trouble with complex tasks, such as: Paying bills. Driving in unfamiliar places. Difficulty communicating. You may have trouble: Finding the right word or naming an object. Forming a sentence that makes sense, or understanding what you read or hear. Changes in your behavior or personality. When this happens, you may: Lose interest in the things that you used to enjoy. Withdraw from social situations. Get angry more easily than usual. Act before thinking. How is this diagnosed? This condition is diagnosed based on: Your symptoms. Your health care provider may ask you and the people you spend time with, such as family and friends, about your symptoms. Evaluation of mental functions (neuropsychological testing). Your health care provider may refer you to a neurologist or mental health specialist to evaluate your mental functions in detail. To identify the cause of your condition, your health care provider may: Get a detailed medical history. Ask about use of alcohol, drugs, and prescription medicines. Do a physical exam. Order blood tests and brain imaging exams. How is this treated? Mild neurocognitive disorder that is caused by medicine use, drug use, infection, or another medical condition may improve when the cause is treated, or when medicines or drugs are stopped. If this disorder has another cause, it generally does not improve and may get worse. In these cases, the goal of treatment is to help you manage the loss of mental function. Treatments in these cases include: Medicine. Medicine mainly helps memory and behavior symptoms. Talk therapy. Talk therapy provides education, emotional support, memory aids, and other ways of making up for problems with mental function. Lifestyle changes, including: Getting regular  exercise. Eating a healthy diet  that includes omega-3 fatty acids. Challenging your thinking and memory skills. Having more social interaction. Follow these instructions at home: Eating and drinking  Drink enough fluid to keep your urine pale yellow. Eat a healthy diet that includes omega-3 fatty acids. These can be found in: Fish. Nuts. Leafy vegetables. Vegetable oils. If you drink alcohol: Limit how much you use to: 0-1 drink a day for women. 0-2 drinks a day for men. Be aware of how much alcohol is in your drink. In the [place] one drink equals one 12 oz bottle of beer (355 mL), one 5 oz glass of wine (148 mL), or one 1 oz glass of hard liquor (44 mL). Lifestyle  Get regular exercise as told by your health care provider. Do not use any products that contain nicotine or tobacco, such as cigarettes, e-cigarettes, and chewing tobacco. If you need help quitting, ask your health care provider. Practice ways to manage stress. If you need help managing stress, ask your health care provider. Continue to have social interaction. Keep your mind active with stimulating activities you enjoy, such as reading or playing games. Make sure to get quality sleep. Follow these tips: Avoid napping during the day. Keep your sleeping area dark and cool. Avoid exercising during the few hours before you go to bed. Avoid caffeine products in the evening. General instructions Take over-the-counter and prescription medicines only as told by your health care provider. Your health care provider may recommend that you avoid taking medicines that can affect thinking, such as pain medicines or sleep medicines. Work with your health care provider to find out what you need help with and what your safety needs are. Keep all follow-up visits. This is important. Where to find more information [employer] on Aging: https://walker.com/ Contact a health care provider if: You have any new symptoms. Get help right  away if: You develop new confusion or your confusion gets worse. You act in ways that place you or your family in danger. Summary Mild neurocognitive disorder is a disorder in which memory does not work as well as it should. Mild neurocognitive disorder can have many causes. It may be the first stage of dementia. To manage your condition, get regular exercise, keep your mind active, get quality sleep, and eat a healthy diet. This information is not intended to replace advice given to you by your health care provider. Make sure you discuss any questions you have with your health care provider. Document Revised: 04/04/2020 Document Reviewed: 04/04/2020 [employer] Patient Education  2024 [employer].

## 2023-07-22 NOTE — Progress Notes (Signed)
Provider:  Melvyn Novas, MD  Primary Care Physician:  Kirby Funk, MD (Inactive) [address] [place] [address]     Referring Provider: Kirby Funk, Md [address] [place],  [place] [address]          Chief Complaint according to patient   Patient presents with:     Established Sleep Patient, RN, here for memory, chemotherapy and breast cancer survivor.             HISTORY OF PRESENT ILLNESS:  Alexis Fox is a 67 y.o. female patient who is here for revisit 07/22/2023 for memory loss.  Chief concern according to patient :  I am suffering from memory loss. I am very sleepy and I feel exhausted. I am 7 years out from my triple negative breast cancer.  The patient was referred on 08-21-2022 to neuropsychology for testing. The meeting with dr Shelva Fox has taken place but his testing battery is pending.  She travels a lot with her spouse and  go by RV.  Her follow-up visit with Alexis Fox is scheduled for September.  He described Alexis Fox is a 67 year old Caucasian female who used to work in the emergency department, and has had met memory difficulties and cognitive changes that the onset dating to the time shortly after her chemotherapy in 2017.  She has a history of obstructive sleep apnea restless legs REM behavior disorder the patient has been using a nasal cradle mask but she did not particularly feel good with CPAP use.  She is a constant saliva for breast cancer.  The excessive daytime sleepiness and ongoing memory difficulties have caused concern to her.  She endorsed an Epworth score of 18 out of 24 even with compliance of CPAP use.  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  She had also a back fusion surgery L4-S1 in 2013 and in 2017 she started on Celexa and Wellbutrin but continues at this time only on Celexa.  She relies more more on her husband for some basic  recall and memory issues this can be short-term but also immediate term memory changes.  Her compliance with CPAP was 87% by days and 70% by hours with an average of 5 hours 22 minutes on those days that the device was used.  Her CPAP is set to 12 cm of water with 3 cm expiratory relief and her residual AHI is 1.9/h.  The residuals are obstructive.  She does not have Cheyne-Stokes respirations.  She was set up in 2020 May 18 so next year her machine would be due for a replacement.  Based on her memory concerns we did perform a Montreal cognitive assessment today and she scored 30 out of 30 points, before she confessed that the Med assitsent had helped her , the true score would be 27/ 30.       HISTORY:  Alexis Fox underwent baseline PSG on 09-28-2018 at [facility] and was diagnosed with obstructive sleep apnea at an [facility] 19.2/h and REM AHI was 26.9/h. No clinically significant hypoxemia was noted. She returns for CPAP titration. .The patient endorsed the Epworth Sleepiness Scale at 12/24 points.   The patient's weight 194 pounds with a height of 64 (inches), resulting in a BMI of 33.1 kg/m2. The patient's neck circumference measured 14 inches.  Primary Snoring and Obstructive Sleep Apnea, Upper airway rsistance- responding  well to CPAP.   Upper Airway Resistance Syndrome was controlled at CPAP of10 cm water pressure during supine sleep, and 8 cm water in non-supine.      Alexis Fox is a 67 y.o. female patient  , seen here on 09-15-2018  in a referral/ revisit from Alexis Fox for restless legs , having increased in intensity.      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  She has had restless leg symptoms for most of her life, she was last seen here 2014  after a sleep study had been obtained through [facility], at the time ordered by Alexis Fox and interpreted by Ander Fox, she did have somewhat fragmented sleep at the time but there was no significant apnea noted she did snore loudly but did not wake up from snoring, she did not have periodic limb movements at the time and her sleep architecture revealed a delayed REM sleep onset and lower REM sleep proportion which was not surprising as she was at the time taking clonazepam citalopram and pramipexole as well as bupropion.   Her current symptoms include memory loss, confusion, headaches, insomnia which has been present since 1998, daytime sleepiness, loud snoring and restless legs have resurfaced by liver for a while successfully treated.  She does not have a diagnosis of an anxiety disorder question if PTSD, chronic back pain breast cancer diagnosed in April 2017, lumpectomy 2017, fusion back surgery L5-S1 in May 2013, laparoscopic cholecystectomy in 2016 January, facet injections bilateral at L4-5.      Review of Systems: Out of a complete 14 system review, the patient complains of only the following symptoms, and all other reviewed systems are negative.:  Fatigue, sleepiness , snoring, REM BD,   How likely are you to doze in the following situations: 0 = not likely, 1 = slight chance, 2 = moderate chance, 3 = high chance   Sitting and Reading? Watching Television? Sitting inactive in a public place (theater or meeting)? As a passenger in a car for an hour without a break? Lying down in the afternoon when circumstances permit? Sitting and talking to someone? Sitting quietly after lunch without alcohol? In a car, while stopped for a few minutes in traffic?   Total = 13/ 24 points   FSS endorsed at 46/ 63 points.   RLS is severe   Social History   Socioeconomic History   Marital status: Married    Spouse name: Not on file   Number of children: 2   Years of  education: Not on file   Highest education level: Not on file  Occupational History   Not on file  Tobacco Use   Smoking status: Never   Smokeless tobacco: Never  Vaping Use   Vaping status: Never Used  Substance and Sexual Activity   Alcohol use: Yes    Alcohol/week: 1.0 standard drink of alcohol    Types: 1 Cans of beer per week    Comment: occasional   Drug use: No   Sexual activity: Not Currently    Birth control/protection: Post-menopausal  Other Topics Concern   Not on file  Social History Narrative   Not on file   Social Determinants of  Health   Financial Resource Strain: Low Risk  (10/24/2018)   Overall Financial Resource Strain (CARDIA)    Difficulty of Paying Living Expenses: Not hard at all  Food Insecurity: Patient Declined (08/24/2022)   Hunger Vital Sign    Worried About Running Out of Food in the Last Year: Patient declined    Ran Out of Food in the Last Year: Patient declined  Transportation Needs: No Transportation Needs (10/04/2022)   PRAPARE - Administrator, Civil Service (Medical): No    Lack of Transportation (Non-Medical): No  Physical Activity: Unknown (10/24/2018)   Exercise Vital Sign    Days of Exercise per Week: 0 days    Minutes of Exercise per Session: Not on file  Stress: No Stress Concern Present (10/24/2018)   [employer] of Occupational Health - Occupational Stress Questionnaire    Feeling of Stress : Not at all  Social Connections: Not on file    Family History  Problem Relation Age of Onset   Heart disease Mother    Pulmonary fibrosis Mother    Hypertension Mother    Sleep apnea Mother    Cancer Father 28       astocytoma   Hypertension Brother    Lymphoma Maternal Aunt    Anesthesia problems Neg Hx     Past Medical History:  Diagnosis Date   Anxiety    Arthritis    knees   Breast cancer (HCC)    Breast cancer of upper-outer quadrant of left female breast (HCC) 03/20/2016   C2 cervical fracture (HCC)     10/22/18, following fall from ladder   Cervicalgia    Cholecystitis    Complication of anesthesia    BP "crashes" post op   Depression    Early cataracts, bilateral    Family history of brain cancer    Family history of breast cancer    GERD (gastroesophageal reflux disease)    Headache    " from my neck"   Hot flashes    Personal history of chemotherapy    Personal history of radiation therapy    Restless leg syndrome    Sleep apnea 10/03/2018   wears CPAP    Past Surgical History:  Procedure Laterality Date   ANTERIOR CERVICAL DECOMP/DISCECTOMY FUSION N/A 01/22/2020   Procedure: ACDF - C2-C3;  Surgeon: Tia Alert, MD;  Location: [facility] OR;  Service: Neurosurgery;  Laterality: N/A;   BACK SURGERY     x2   BREAST BIOPSY     BREAST LUMPECTOMY Left 2018   BUNIONECTOMY     CHOLECYSTECTOMY N/A 10/04/2015   Procedure: LAPAROSCOPIC CHOLECYSTECTOMY WITH INTRAOPERATIVE CHOLANGIOGRAM;  Surgeon: Gaynelle Adu, MD;  Location: [facility] OR;  Service: General;  Laterality: N/A;   ELBOW SURGERY     right for epicondylitis 2010    ESOPHAGEAL MANOMETRY N/A 11/30/2020   Procedure: ESOPHAGEAL MANOMETRY (EM);  Surgeon: Kerin Salen, MD;  Location: [facility];  Service: Gastroenterology;  Laterality: N/A;   FOOT SURGERY     1983 -tarsal tunnel release   IR GENERIC HISTORICAL  10/26/2016   IR CV LINE INJECTION 10/26/2016 [facility]   LUMBAR DISC SURGERY  04/04/2012   MASS EXCISION Left 02/06/2021   Procedure: EXCISION LEFT FOOT SOFT TISSUE BETWEEN SECOND AND THIRD AND THIRD AND FOURTH TOES;  Surgeon: Asencion Islam, DPM;  Location: [facility];  Service: Podiatry;  Laterality: Left;   PORT-A-CATH REMOVAL N/A 11/15/2016   Procedure: REMOVAL PORT-A-CATH;  Surgeon: Molli Hazard  Dwain Sarna, MD;  Location: [facility];  Service: General;  Laterality: N/A;   PORTACATH PLACEMENT Right 04/02/2016   Procedure: INSERTION PORT-A-CATH WITH [place];  Surgeon: Emelia Loron, MD;  Location:  [facility];  Service: General;  Laterality: Right;   RADIOACTIVE SEED GUIDED PARTIAL MASTECTOMY/AXILLARY SENTINEL NODE BIOPSY/AXILLARY NODE DISSECTION Left 09/12/2016   Procedure: LEFT BREAST SEED GUIDED LUMPECTOMY, LEFT AXILLARY SENTINEL NODE BIOPSY, LEFT SEED GUIDED AXILLARY NODE EXCISION( TARGETED AXILLARY DISSECTION), BLUE DYE INJECTION;  Surgeon: Emelia Loron, MD;  Location: [facility];  Service: General;  Laterality: Left;   SACROILIAC JOINT FUSION Left 05/18/2020   Procedure: LEFT SACROILIAC JOINT FUSION;  Surgeon: Estill Bamberg, MD;  Location: [facility] OR;  Service: Orthopedics;  Laterality: Left;   SPINAL FUSION  5/12   L5-S1   SPINAL FUSION  05/18/2019   L4-5   TARSAL TUNNEL RELEASE       Current Outpatient Medications on File Prior to Visit  Medication Sig Dispense Refill   Ascorbic Acid (VITAMIN C) 500 MG CAPS Take 500 mg by mouth daily.     buPROPion (WELLBUTRIN XL) 150 MG 24 hr tablet Take 150 mg by mouth daily.  4   cholecalciferol (VITAMIN D3) 25 MCG (1000 UNIT) tablet Take 1,000 Units by mouth in the morning and at bedtime.     citalopram (CELEXA) 40 MG tablet Take 40 mg by mouth at bedtime.   3   clobetasol ointment (TEMOVATE) 0.05 % Apply 1 application topically 2 (two) times daily. (Patient taking differently: Apply 1 application  topically 2 (two) times daily as needed (irritation).) 30 g 1   clonazePAM (KLONOPIN) 1 MG tablet Take 1 mg by mouth at bedtime.      docusate sodium (COLACE) 100 MG capsule Take 100 mg by mouth daily.     Ferrous Sulfate (IRON) 325 (65 Fe) MG TABS Take 325 mg by mouth daily.     fluticasone (FLONASE) 50 MCG/ACT nasal spray Place 1 spray into both nostrils daily as needed for allergies.     Multiple Vitamins-Minerals (MULTIVITAMIN WITH MINERALS) tablet Take 1 tablet by mouth at bedtime.     pramipexole (MIRAPEX) 0.5 MG tablet TAKE 1 TABLET (0.5 MG TOTAL) BY MOUTH DAILY AS NEEDED (TAKE ALONG WITH EXTENDED RELEASE DOSE)  (Patient taking differently: Take 0.5 mg by mouth See admin instructions. 0.5 mg in the afternoon as needed, 0.5 mg at bedtime) 90 tablet 3   Pramipexole Dihydrochloride (MIRAPEX ER) 0.75 MG TB24 Take 1 tablet (0.75 mg total) by mouth daily. 10 tablet 0   Current Facility-Administered Medications on File Prior to Visit  Medication Dose Route Frequency Provider Last Rate Last Admin   sodium chloride flush (NS) 0.9 % injection 10 mL  10 mL Intracatheter PRN Serena Croissant, MD   10 mL at 07/19/16 1434    Allergies  Allergen Reactions   Turmeric Diarrhea    Severe diarrhea   Decadron [Dexamethasone]     Exacerbates restless leg     DIAGNOSTIC DATA (LABS, IMAGING, TESTING) - I reviewed patient records, labs, notes, testing and imaging myself where available.  Lab Results  Component Value Date   WBC 12.2 (H) 08/25/2022   HGB 10.6 (L) 08/25/2022   HCT 33.0 (L) 08/25/2022   MCV 95.7 08/25/2022   PLT 182 08/25/2022      Component Value Date/Time   NA 146 (H) 05/10/2020 1344   NA 139 05/27/2017 1126   K 4.0 05/10/2020 1344   K  4.0 05/27/2017 1126   CL 108 05/10/2020 1344   CO2 26 05/10/2020 1344   CO2 28 05/27/2017 1126   GLUCOSE 91 05/10/2020 1344   GLUCOSE 92 05/27/2017 1126   BUN 20 05/10/2020 1344   BUN 17.9 05/27/2017 1126   CREATININE 0.83 05/10/2020 1344   CREATININE 1.0 05/27/2017 1126   CALCIUM 9.5 05/10/2020 1344   CALCIUM 10.0 05/27/2017 1126   PROT 6.7 05/10/2020 1344   PROT 6.9 09/15/2018 1310   PROT 7.4 05/27/2017 1126   ALBUMIN 3.6 05/10/2020 1344   ALBUMIN 3.9 05/27/2017 1126   AST 22 05/10/2020 1344   AST 28 05/27/2017 1126   ALT 20 05/10/2020 1344   ALT 52 05/27/2017 1126   ALKPHOS 68 05/10/2020 1344   ALKPHOS 69 05/27/2017 1126   BILITOT 0.5 05/10/2020 1344   BILITOT 0.85 05/27/2017 1126   GFRNONAA >60 05/10/2020 1344   GFRAA >60 05/10/2020 1344   No results found for: "CHOL", "HDL", "LDLCALC", "LDLDIRECT", "TRIG", "CHOLHDL" No results found for:  "HGBA1C" No results found for: "VITAMINB12" Lab Results  Component Value Date   TSH 2.680 09/15/2018    PHYSICAL EXAM:  Today's Vitals   07/22/23 1322  BP: 121/70  Pulse: 71  Weight: 207 lb 12.8 oz (94.3 kg)  Height: 5\' 4"  (1.626 m)   Body mass index is 35.67 kg/m.   Wt Readings from Last 3 Encounters:  07/22/23 207 lb 12.8 oz (94.3 kg)  07/17/23 208 lb (94.3 kg)  10/15/22 205 lb (93 kg)     Ht Readings from Last 3 Encounters:  07/22/23 5\' 4"  (1.626 m)  07/17/23 5\' 4"  (1.626 m)  10/15/22 5\' 4"  (1.626 m)      General: The patient is awake, alert and appears in acute distress, emotional and tearful. The patient is well groomed. Head: Normocephalic, atraumatic. Neck is supple.  Mallampati 2,  neck circumference:15 inches .  Nasal airflow is patent.  Retrognathia is not seen.  Dental status: biological  Cardiovascular:  Regular rate and cardiac rhythm by pulse,  without distended neck veins. Respiratory: Lungs are clear to auscultation.  Skin:  Without evidence of ankle edema, or rash. Trunk: The patient's posture is erect.   NEUROLOGIC EXAM: The patient is awake and alert, oriented to place and time.   Memory subjective described as intact.  Attention span & concentration ability appears normal.  Speech is fluent,  without  dysarthria, dysphonia or aphasia.  Mood and affect are appropriate.   Cranial nerves: no loss of smell or taste reported  Pupils are equal and briskly reactive to light. Funduscopic exam deferred.  Extraocular movements in vertical and horizontal planes were intact and without nystagmus. No Diplopia. Visual fields by finger perimetry are intact. Hearing was intact to soft voice and finger rubbing.    Facial sensation intact to fine touch.  Facial motor strength is symmetric and tongue and uvula move midline.  Neck ROM : rotation, tilt and flexion extension were normal for age and shoulder shrug was symmetrical.    Motor exam:  Symmetric bulk,  tone and ROM.   Normal tone without cog- wheeling, symmetric grip strength .   Sensory:  Fine touch and vibration were normal.  Proprioception tested in the upper extremities was normal.   Coordination: Rapid alternating movements in the fingers/hands were of normal speed.  The Finger-to-nose maneuver was intact without evidence of ataxia, dysmetria or tremor.   Gait and station: Patient could rise unassisted from a seated position, walked  without assistive device.  Stance is of normal width/ base and the patient turned with 3 steps.  Toe and heel walk were deferred.  Deep tendon reflexes: in the  upper and lower extremities are symmetric and intact.  Babinski response was deferred .    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 here with:    1) Subjective and family observed aphasia.     07/22/2023    2:04 PM 08/21/2022    2:04 PM  Montreal Cognitive Assessment   Visuospatial/ Executive (0/5) 4 5  Naming (0/3) 2 3  Attention: Read list of digits (0/2) 2 2  Attention: Read list of letters (0/1) 1 1  Attention: Serial 7 subtraction starting at 100 (0/3) 3 3  Language: Repeat phrase (0/2) 2 2  Language : Fluency (0/1) 1 1  Abstraction (0/2) 2 2  Delayed Recall (0/5) 4 2  Orientation (0/6) 6 6  Total 27 27  Adjusted Score (based on education) 27 27     2) OSA on CPAP, ONO> Received the ONO report from [facility] after reaching out to inquire about this. Reviewed the Dr Vickey Huger and there is no need to move forward assessing need for oxygen as the study showed oxygen level only dropped as low as 90% . She continues to use CPAP  inspite of her residual hypersomnia.   3) hypersomnia on Mirapex, on celexa and Wellbutrin, klonopin.  These are all for RLS and for sleep as well, and we will refill those today.  Highly anxious.   I am awaiting the ATN results and Dr Tammi Klippel testing battery. We both reviewed the MRI from 08-2022.    I plan to  follow up either personally or through our NP within 6 months.   I would like to thank and Kirby Funk, Md [address] [place],  [place] [address] for allowing me to meet with and to take care of this pleasant patient.   CC: I will share my notes with Dr Shelva Fox .  After spending a total time of  34  minutes face to face and additional time for physical and neurologic examination, review of laboratory studies,  personal review of imaging studies, reports and results of other testing and review of referral information / records as far as provided in visit,   Electronically signed by: Melvyn Novas, MD 07/22/2023 2:05 PM  [facility] Board certified by The [employer] of Sleep Medicine and Diplomate of the [employer] of Sleep Medicine. Board certified In Neurology through the ABPN, Fellow of the [employer] of Neu[employer].

## 2023-07-29 DIAGNOSIS — L237 Allergic contact dermatitis due to plants, except food: Secondary | ICD-10-CM | POA: Diagnosis not present

## 2023-07-30 DIAGNOSIS — G4733 Obstructive sleep apnea (adult) (pediatric): Secondary | ICD-10-CM | POA: Diagnosis not present

## 2023-07-30 DIAGNOSIS — G3184 Mild cognitive impairment, so stated: Secondary | ICD-10-CM | POA: Diagnosis not present

## 2023-07-30 DIAGNOSIS — F5101 Primary insomnia: Secondary | ICD-10-CM | POA: Diagnosis not present

## 2023-07-30 DIAGNOSIS — R413 Other amnesia: Secondary | ICD-10-CM | POA: Diagnosis not present

## 2023-07-30 LAB — APOE ALZHEIMER'S RISK

## 2023-07-30 LAB — ATN PROFILE
A -- Beta-amyloid 42/40 Ratio: 0.116 (ref >0.102)
Beta-amyloid 40: 205.46 pg/mL
Beta-amyloid 42: 23.91 pg/mL
N -- NfL, Plasma: 2.11 pg/mL (ref 0.00–4.61)
T -- p-tau181: 0.8 pg/mL (ref 0.00–0.97)

## 2023-07-31 NOTE — Progress Notes (Signed)
   Behavioral Observations:  The patient appeared well-groomed and appropriately dressed. Her manners were polite and appropriate to the situation. The patient's attitude towards testing was positive and her effort was good.   Neuropsychology Note  Alexis Fox completed 75 minutes of neuropsychological testing with technician, Staci Acosta, BA, under the supervision of Arley Phenix, PsyD., Clinical Neuropsychologist. The patient did not appear overtly distressed by the testing session, per behavioral observation or via self-report to the technician. Rest breaks were offered.   Clinical Decision Making: In considering the patient's current level of functioning, level of presumed impairment, nature of symptoms, emotional and behavioral responses during clinical interview, level of literacy, and observed level of motivation/effort, a battery of tests was selected by Dr. Kieth Brightly during initial consultation on 07/15/2023. This was communicated to the technician. Communication between the neuropsychologist and technician was ongoing throughout the testing session and changes were made as deemed necessary based on patient performance on testing, technician observations and additional pertinent factors such as those listed above.  Tests Administered: Comprehensive Attention Battery (CAB) Continuous Performance Test (CPT)  Results: Will be included in final report   Feedback to Patient: Alexis Fox will return on 04/28/2024 for an interactive feedback session with Dr. Kieth Brightly at which time her test performances, clinical impressions and treatment recommendations will be reviewed in detail. The patient understands she can contact our office should she require our assistance before this time.  75 minutes spent face-to-face with patient administering standardized tests, 30 minutes spent scoring Radiographer, therapeutic). [CPT [id number], [id number]]  Full report to follow.

## 2023-08-01 ENCOUNTER — Encounter (HOSPITAL_BASED_OUTPATIENT_CLINIC_OR_DEPARTMENT_OTHER): Payer: Medicare PPO

## 2023-08-01 DIAGNOSIS — G4733 Obstructive sleep apnea (adult) (pediatric): Secondary | ICD-10-CM

## 2023-08-01 DIAGNOSIS — R413 Other amnesia: Secondary | ICD-10-CM | POA: Diagnosis not present

## 2023-08-01 DIAGNOSIS — G3184 Mild cognitive impairment, so stated: Secondary | ICD-10-CM

## 2023-08-01 DIAGNOSIS — F5101 Primary insomnia: Secondary | ICD-10-CM | POA: Diagnosis not present

## 2023-08-13 DIAGNOSIS — M5136 Other intervertebral disc degeneration, lumbar region: Secondary | ICD-10-CM | POA: Diagnosis not present

## 2023-08-13 DIAGNOSIS — Z6835 Body mass index (BMI) 35.0-35.9, adult: Secondary | ICD-10-CM | POA: Diagnosis not present

## 2023-08-13 DIAGNOSIS — M4316 Spondylolisthesis, lumbar region: Secondary | ICD-10-CM | POA: Diagnosis not present

## 2023-08-13 NOTE — Progress Notes (Signed)
Behavioral Observations:  The patient appeared well-groomed and appropriately dressed. Her manners were polite and appropriate to the situation. The patient's attitude towards testing was positive and her effort was good.   Neuropsychology Note  Alexis Fox completed 135 minutes of neuropsychological testing with technician, Staci Acosta, BA, under the supervision of Arley Phenix, PsyD., Clinical Neuropsychologist. The patient did not appear overtly distressed by the testing session, per behavioral observation or via self-report to the technician. Rest breaks were offered.   Clinical Decision Making: In considering the patient's current level of functioning, level of presumed impairment, nature of symptoms, emotional and behavioral responses during clinical interview, level of literacy, and observed level of motivation/effort, a battery of tests was selected by Dr. Kieth Brightly during initial consultation on 07/15/2023. This was communicated to the technician. Communication between the neuropsychologist and technician was ongoing throughout the testing session and changes were made as deemed necessary based on patient performance on testing, technician observations and additional pertinent factors such as those listed above.  Tests Administered: Wechsler Adult Intelligence Scale, 4th Edition (WAIS-IV) Wechsler Memory Scale, 4th Edition (WMS-IV); Adult Battery  Results:  WAIS-IV:   Composite Score Summary  Scale Sum of Scaled Scores Composite Score Percentile Rank 95% Conf. Interval Qualitative Description  Verbal Comprehension 28 VCI 96 39 91-102 Average  Perceptual Reasoning 40 PRI 119 90 112-124 High Average  Working Memory 19 WMI 97 42 90-104 Average  Processing Speed 24 PSI 111 77 102-118 High Average  Full Scale 111 FSIQ 107 68 103-111 Average  General Ability 68 GAI 107 68 102-112 Average   Verbal Comprehension Subtests Summary  Subtest Raw Score Scaled Score  Percentile Rank Reference Group Scaled Score SEM  Similarities 19 8 25 7  1.04  Vocabulary 49 13 84 14 0.67  Information 9 7 16 8  0.73  The scaled scores in the Reference Group Scaled Score column are based on the performance of examinees aged 20:0-34:11 (i.e., the reference group). See Chapter 6 of the WAIS-IV Technical and Interpretive Manual for more information.  Perceptual Reasoning Subtests Summary  Subtest Raw Score Scaled Score Percentile Rank Reference Group Scaled Score SEM  Block Design 42 12 75 9 1.08  Matrix Reasoning 21 14 91 12 0.90  Visual Puzzles 16 14 91 10 0.85   Working Librarian, academic Raw Score Scaled Score Percentile Rank Reference Group Scaled Score SEM  Digit Span 25 10 50 8 0.79  Arithmetic 13 9 37 9 0.99   Processing Speed Subtests Summary  Subtest Raw Score Scaled Score Percentile Rank Reference Group Scaled Score SEM  Symbol Search 30 12 75 9 1.31  Coding 63 12 75 8 0.99   WMS-IV:  Index Score Summary  Index Sum of Scaled Scores Index Score Percentile Rank 95% Confidence Interval Qualitative Descriptor  Auditory Memory (AMI) 24 77 6 72-84 Borderline  Visual Memory (VMI) 26 80 9 75-86 Low Average  Visual Working Memory (VWMI) 21 103 58 96-110 Average  Immediate Memory (IMI) 27 78 7 73-85 Borderline  Delayed Memory (DMI) 23 70 2 65-79 Borderline   Primary Subtest Scaled Score Summary  Subtest Domain Raw Score Scaled Score Percentile Rank  Logical Memory I AM 15 5 5   Logical Memory II AM 7 4 2   Verbal Paired Associates I AM 16 7 16   Verbal Paired Associates II AM 6 8 25   Designs I VM 37 3 1  Designs II VM 0 1 0.1  Visual Reproduction I VM 36  12 75  Visual Reproduction II VM 19 10 50  Spatial Addition VWM 15 14 91  Symbol Span VWM 12 7 16    Auditory Memory Process Score Summary  Process Score Raw Score Scaled Score Percentile Rank Cumulative Percentage (Base Rate)  LM II Recognition 22 - - 17-25%  VPA II Recognition 38 - -  51-75%   Visual Memory Process Score Summary  Process Score Raw Score Scaled Score Percentile Rank Cumulative Percentage (Base Rate)  DE I Content 18 2 0.4 -  DE I Spatial 9 5 5  -  DE II Content 0 1 0.1 -  DE II Spatial 0 1 0.1 -  DE II Recognition 3 - - <=2%  VR II Recognition 6 - - >75%   ABILITY-MEMORY ANALYSIS  Ability Score:  VCI: 96 Date of Testing:  WAIS-IV; WMS-IV 2023/08/01  Predicted Difference Method   Index Predicted WMS-IV Index Score Actual WMS-IV Index Score Difference Critical Value  Significant Difference Y/N Base Rate  Auditory Memory 98 77 21 10.15 Y 5-10%  Visual Memory 98 80 18 9.30 Y 10%  Visual Working Memory 98 103 -5 11.68 N   Immediate Memory 98 78 20 11.15 Y 5-10%  Delayed Memory 98 70 28 11.49 Y 1-2%  Statistical significance (critical value) at the .01 level.    Feedback to Patient: Alexis Fox will return on 04/28/2024 for an interactive feedback session with Dr. Kieth Brightly at which time her test performances, clinical impressions and treatment recommendations will be reviewed in detail. The patient understands she can contact our office should she require our assistance before this time.  135 minutes spent face-to-face with patient administering standardized tests, 30 minutes spent scoring Radiographer, therapeutic). [CPT [id number], [id number]]  Full report to follow.

## 2023-08-20 NOTE — Pre-Procedure Instructions (Signed)
Surgical Instructions   Your procedure is scheduled on Tuesday, October 1st. Report to [facility] Main Entrance "A" at 05:30 A.M., then check in with the Admitting office. Any questions or running late day of surgery: call [phone]  Questions prior to your surgery date: call [phone], Monday-Friday, 8am-4pm. If you experience any cold or flu symptoms such as cough, fever, chills, shortness of breath, etc. between now and your scheduled surgery, please notify us at the above number.     Remember:  Do not eat after midnight the night before your surgery  You may drink clear liquids until 04:30 AM the morning of your surgery.   Clear liquids allowed are: Water, Non-Citrus Juices (without pulp), Carbonated Beverages, Clear Tea, Black Coffee Only (NO MILK, CREAM OR POWDERED CREAMER of any kind), and Gatorade.  Patient Instructions  The night before surgery:  No food after midnight. ONLY clear liquids after midnight  The day of surgery (if you do NOT have diabetes):  Drink ONE (1) Pre-Surgery Clear Ensure by 04:30 AM the morning of surgery. Drink in one sitting. Do not sip.  This drink was given to you during your hospital  pre-op appointment visit.  Nothing else to drink after completing the  Pre-Surgery Clear Ensure.         If you have questions, please contact your surgeon's office.    Take these medicines the morning of surgery with A SIP OF WATER  buPROPion (WELLBUTRIN XL)  pramipexole (MIRAPEX)   May take these medicines IF NEEDED: acetaminophen (TYLENOL)  fluticasone (FLONASE)   One week prior to surgery, STOP taking any Aspirin (unless otherwise instructed by your surgeon) Aleve, Naproxen, Ibuprofen, Motrin, Advil, Goody's, BC's, all herbal medications, fish oil, and non-prescription vitamins.                     Do NOT Smoke (Tobacco/Vaping) for 24 hours prior to your procedure.  If you use a CPAP at night, you may bring your mask/headgear for your overnight  stay.   You will be asked to remove any contacts, glasses, piercing's, hearing aid's, dentures/partials prior to surgery. Please bring cases for these items if needed.    Patients discharged the day of surgery will not be allowed to drive home, and someone needs to stay with them for 24 hours.  SURGICAL WAITING ROOM VISITATION Patients may have no more than 2 support people in the waiting area - these visitors may rotate.   Pre-op nurse will coordinate an appropriate time for 1 ADULT support person, who may not rotate, to accompany patient in pre-op.  Children under the age of 45 must have an adult with them who is not the patient and must remain in the main waiting area with an adult.  If the patient needs to stay at the hospital during part of their recovery, the visitor guidelines for inpatient rooms apply.  Please refer to the [facility] website for the visitor guidelines for any additional information.   If you received a COVID test during your pre-op visit  it is requested that you wear a mask when out in public, stay away from anyone that may not be feeling well and notify your surgeon if you develop symptoms. If you have been in contact with anyone that has tested positive in the last 10 days please notify you surgeon.      Pre-operative 5 CHG Bathing Instructions   You can play a key role in reducing the risk of  infection after surgery. Your skin needs to be as free of germs as possible. You can reduce the number of germs on your skin by washing with CHG (chlorhexidine gluconate) soap before surgery. CHG is an antiseptic soap that kills germs and continues to kill germs even after washing.   DO NOT use if you have an allergy to chlorhexidine/CHG or antibacterial soaps. If your skin becomes reddened or irritated, stop using the CHG and notify one of our RNs at [phone].   Please shower with the CHG soap starting 4 days before surgery using the following schedule:      Please keep in mind the following:  DO NOT shave, including legs and underarms, starting the day of your first shower.   You may shave your face at any point before/day of surgery.  Place clean sheets on your bed the day you start using CHG soap. Use a clean washcloth (not used since being washed) for each shower. DO NOT sleep with pets once you start using the CHG.   CHG Shower Instructions:  Wash your face and private area with normal soap. If you choose to wash your hair, wash first with your normal shampoo.  After you use shampoo/soap, rinse your hair and body thoroughly to remove shampoo/soap residue.  Turn the water OFF and apply about 3 tablespoons (45 ml) of CHG soap to a CLEAN washcloth.  Apply CHG soap ONLY FROM YOUR NECK DOWN TO YOUR TOES (washing for 3-5 minutes)  DO NOT use CHG soap on face, private areas, open wounds, or sores.  Pay special attention to the area where your surgery is being performed.  If you are having back surgery, having someone wash your back for you may be helpful. Wait 2 minutes after CHG soap is applied, then you may rinse off the CHG soap.  Pat dry with a clean towel  Put on clean clothes/pajamas   If you choose to wear lotion, please use ONLY the CHG-compatible lotions on the back of this paper.   Additional instructions for the day of surgery: DO NOT APPLY any lotions, deodorants, cologne, or perfumes.   Do not bring valuables to the hospital. [facility] is not responsible for any belongings/valuables. Do not wear nail polish, gel polish, artificial nails, or any other type of covering on natural nails (fingers and toes) Do not wear jewelry or makeup Put on clean/comfortable clothes.  Please brush your teeth.  Ask your nurse before applying any prescription medications to the skin.     CHG Compatible Lotions   Aveeno Moisturizing lotion  Cetaphil Moisturizing Cream  Cetaphil Moisturizing Lotion  Clairol Herbal Essence Moisturizing  Lotion, Dry Skin  Clairol Herbal Essence Moisturizing Lotion, Extra Dry Skin  Clairol Herbal Essence Moisturizing Lotion, Normal Skin  Curel Age Defying Therapeutic Moisturizing Lotion with Alpha Hydroxy  Curel Extreme Care Body Lotion  Curel Soothing Hands Moisturizing Hand Lotion  Curel Therapeutic Moisturizing Cream, Fragrance-Free  Curel Therapeutic Moisturizing Lotion, Fragrance-Free  Curel Therapeutic Moisturizing Lotion, Original Formula  Eucerin Daily Replenishing Lotion  Eucerin Dry Skin Therapy Plus Alpha Hydroxy Crme  Eucerin Dry Skin Therapy Plus Alpha Hydroxy Lotion  Eucerin Original Crme  Eucerin Original Lotion  Eucerin Plus Crme Eucerin Plus Lotion  Eucerin TriLipid Replenishing Lotion  Keri Anti-Bacterial Hand Lotion  Keri Deep Conditioning Original Lotion Dry Skin Formula Softly Scented  Keri Deep Conditioning Original Lotion, Fragrance Free Sensitive Skin Formula  Keri Lotion Fast Absorbing Fragrance Free Sensitive Skin Formula  Keri Lotion  Fast Absorbing Softly Scented Dry Skin Formula  Keri Original Lotion  Keri Skin Renewal Lotion Keri Silky Smooth Lotion  Keri Silky Smooth Sensitive Skin Lotion  Nivea Body Creamy Conditioning Oil  Nivea Body Extra Enriched Lotion  Nivea Body Original Lotion  Nivea Body Sheer Moisturizing Lotion Nivea Crme  Nivea Skin Firming Lotion  NutraDerm 30 Skin Lotion  NutraDerm Skin Lotion  NutraDerm Therapeutic Skin Cream  NutraDerm Therapeutic Skin Lotion  ProShield Protective Hand Cream  Provon moisturizing lotion  Please read over the following fact sheets that you were given.

## 2023-08-21 ENCOUNTER — Encounter (HOSPITAL_COMMUNITY): Payer: Self-pay

## 2023-08-21 ENCOUNTER — Other Ambulatory Visit: Payer: Self-pay

## 2023-08-21 ENCOUNTER — Encounter (HOSPITAL_COMMUNITY)
Admission: RE | Admit: 2023-08-21 | Discharge: 2023-08-21 | Disposition: A | Discharge status: Home / Self Care | Payer: Medicare PPO | Source: Ambulatory Visit | Attending: Orthopedic Surgery | Admitting: Orthopedic Surgery

## 2023-08-21 VITALS — BP 131/62 | HR 67 | Temp 98.3°F | Resp 18 | Ht 64.0 in | Wt 209.0 lb

## 2023-08-21 DIAGNOSIS — Z01818 Encounter for other preprocedural examination: Secondary | ICD-10-CM | POA: Insufficient documentation

## 2023-08-21 DIAGNOSIS — I4510 Unspecified right bundle-branch block: Secondary | ICD-10-CM | POA: Insufficient documentation

## 2023-08-21 LAB — URINALYSIS, W/ REFLEX TO CULTURE (INFECTION SUSPECTED)
Bacteria, UA: NONE SEEN
Bilirubin Urine: NEGATIVE
Glucose, UA: NEGATIVE mg/dL
Hgb urine dipstick: NEGATIVE
Ketones, ur: NEGATIVE mg/dL
Leukocytes,Ua: NEGATIVE
Nitrite: NEGATIVE
Protein, ur: NEGATIVE mg/dL
Specific Gravity, Urine: 1.018 (ref 1.005–1.030)
pH: 5 (ref 5.0–8.0)

## 2023-08-21 LAB — SURGICAL PCR SCREEN
MRSA, PCR: NEGATIVE
Staphylococcus aureus: NEGATIVE

## 2023-08-21 LAB — CBC
HCT: 40.9 % (ref 36.0–46.0)
Hemoglobin: 12.9 g/dL (ref 12.0–15.0)
MCH: 29.7 pg (ref 26.0–34.0)
MCHC: 31.5 g/dL (ref 30.0–36.0)
MCV: 94.2 fL (ref 80.0–100.0)
Platelets: 254 10*3/uL (ref 150–400)
RBC: 4.34 MIL/uL (ref 3.87–5.11)
RDW: 13.4 % (ref 11.5–15.5)
WBC: 8 10*3/uL (ref 4.0–10.5)
nRBC: 0 % (ref 0.0–0.2)

## 2023-08-21 LAB — BASIC METABOLIC PANEL WITH GFR
Anion gap: 7 (ref 5–15)
BUN: 18 mg/dL (ref 8–23)
CO2: 24 mmol/L (ref 22–32)
Calcium: 8.9 mg/dL (ref 8.9–10.3)
Chloride: 103 mmol/L (ref 98–111)
Creatinine, Ser: 0.85 mg/dL (ref 0.44–1.00)
GFR, Estimated: >60 mL/min (ref >60)
Glucose, Bld: 92 mg/dL (ref 70–99)
Potassium: 4.6 mmol/L (ref 3.5–5.1)
Sodium: 134 mmol/L — ABNORMAL LOW (ref 135–145)

## 2023-08-21 NOTE — Progress Notes (Signed)
PCP - Dr. Hillard Danker Cardiologist - Denies Neurologist - Dr. Porfirio Mylar Dohmeier  PPM/ICD - denies  Chest x-ray - n/a EKG - 08/21/23 Stress Test - denies ECHO - 05/04/16 Cardiac Cath - denies   Sleep Study - yes CPAP - yes, does not remember her settings  Fasting Blood Sugar - n/a Checks Blood Sugar _____ times a day:  Denies being a diabetic  Blood Thinner Instructions: N/A Aspirin Instructions: N/A   ERAS Protcol - ERAS PRE-SURGERY Ensure   COVID TEST-  N/A   Anesthesia review:Y, cardiac history, has a right bundle branch block that pcp manages.  She does not see a cardiologist.   Patient denies shortness of breath, fever, cough and chest pain at PAT appointment. Patient denies any respiratory issues or illnesses at this time.    All instructions explained to the patient, with a verbal understanding of the material. Patient agrees to go over the instructions while at home for a better understanding. Patient also instructed to self quarantine after being tested for COVID-19. The opportunity to ask questions was provided.

## 2023-08-28 ENCOUNTER — Other Ambulatory Visit (HOSPITAL_COMMUNITY): Payer: Self-pay | Admitting: Student

## 2023-08-28 DIAGNOSIS — M4316 Spondylolisthesis, lumbar region: Secondary | ICD-10-CM

## 2023-08-29 ENCOUNTER — Ambulatory Visit (HOSPITAL_COMMUNITY)
Admission: RE | Admit: 2023-08-29 | Discharge: 2023-08-29 | Disposition: A | Discharge status: Home / Self Care | Payer: Medicare PPO | Source: Ambulatory Visit | Attending: Student | Admitting: Student

## 2023-08-29 DIAGNOSIS — Z4789 Encounter for other orthopedic aftercare: Secondary | ICD-10-CM | POA: Diagnosis not present

## 2023-08-29 DIAGNOSIS — M4316 Spondylolisthesis, lumbar region: Secondary | ICD-10-CM | POA: Insufficient documentation

## 2023-08-29 DIAGNOSIS — M5126 Other intervertebral disc displacement, lumbar region: Secondary | ICD-10-CM | POA: Diagnosis not present

## 2023-08-29 DIAGNOSIS — M47816 Spondylosis without myelopathy or radiculopathy, lumbar region: Secondary | ICD-10-CM | POA: Diagnosis not present

## 2023-08-29 DIAGNOSIS — M48061 Spinal stenosis, lumbar region without neurogenic claudication: Secondary | ICD-10-CM | POA: Diagnosis not present

## 2023-09-02 MED ORDER — TRANEXAMIC ACID 1000 MG/10ML IV SOLN
2000.0000 mg | INTRAVENOUS | Status: DC
  Filled 2023-09-02: qty 20

## 2023-09-03 ENCOUNTER — Ambulatory Visit (HOSPITAL_COMMUNITY): Payer: Medicare PPO | Admitting: Anesthesiology

## 2023-09-03 ENCOUNTER — Other Ambulatory Visit: Payer: Self-pay

## 2023-09-03 ENCOUNTER — Other Ambulatory Visit: Payer: Self-pay | Admitting: Orthopedic Surgery

## 2023-09-03 ENCOUNTER — Inpatient Hospital Stay (HOSPITAL_COMMUNITY)
Admission: RE | Admit: 2023-09-03 | Discharge: 2023-09-05 | DRG: 470 | Disposition: A | Discharge status: Home / Self Care | Payer: Medicare PPO | Attending: Orthopedic Surgery | Admitting: Orthopedic Surgery

## 2023-09-03 ENCOUNTER — Ambulatory Visit (HOSPITAL_COMMUNITY): Payer: Medicare PPO | Admitting: Physician Assistant

## 2023-09-03 ENCOUNTER — Encounter (HOSPITAL_COMMUNITY): Payer: Self-pay | Admitting: Orthopedic Surgery

## 2023-09-03 ENCOUNTER — Telehealth: Payer: Self-pay | Admitting: Orthopedic Surgery

## 2023-09-03 ENCOUNTER — Encounter (HOSPITAL_COMMUNITY)
Admission: RE | Disposition: A | Discharge status: Home / Self Care | Payer: Self-pay | Source: Home / Self Care | Attending: Orthopedic Surgery

## 2023-09-03 DIAGNOSIS — Z807 Family history of other malignant neoplasms of lymphoid, hematopoietic and related tissues: Secondary | ICD-10-CM | POA: Diagnosis not present

## 2023-09-03 DIAGNOSIS — G8929 Other chronic pain: Secondary | ICD-10-CM | POA: Diagnosis present

## 2023-09-03 DIAGNOSIS — Z01818 Encounter for other preprocedural examination: Principal | ICD-10-CM

## 2023-09-03 DIAGNOSIS — G2581 Restless legs syndrome: Secondary | ICD-10-CM | POA: Diagnosis present

## 2023-09-03 DIAGNOSIS — R5383 Other fatigue: Secondary | ICD-10-CM | POA: Diagnosis present

## 2023-09-03 DIAGNOSIS — G8918 Other acute postprocedural pain: Secondary | ICD-10-CM | POA: Diagnosis not present

## 2023-09-03 DIAGNOSIS — Z96651 Presence of right artificial knee joint: Secondary | ICD-10-CM

## 2023-09-03 DIAGNOSIS — M179 Osteoarthritis of knee, unspecified: Secondary | ICD-10-CM | POA: Diagnosis present

## 2023-09-03 DIAGNOSIS — Z171 Estrogen receptor negative status [ER-]: Secondary | ICD-10-CM | POA: Diagnosis not present

## 2023-09-03 DIAGNOSIS — Z9221 Personal history of antineoplastic chemotherapy: Secondary | ICD-10-CM | POA: Diagnosis not present

## 2023-09-03 DIAGNOSIS — I959 Hypotension, unspecified: Secondary | ICD-10-CM | POA: Diagnosis present

## 2023-09-03 DIAGNOSIS — Z803 Family history of malignant neoplasm of breast: Secondary | ICD-10-CM

## 2023-09-03 DIAGNOSIS — M65961 Unspecified synovitis and tenosynovitis, right lower leg: Secondary | ICD-10-CM | POA: Diagnosis present

## 2023-09-03 DIAGNOSIS — M1711 Unilateral primary osteoarthritis, right knee: Principal | ICD-10-CM

## 2023-09-03 DIAGNOSIS — Z923 Personal history of irradiation: Secondary | ICD-10-CM | POA: Diagnosis not present

## 2023-09-03 DIAGNOSIS — G4730 Sleep apnea, unspecified: Secondary | ICD-10-CM

## 2023-09-03 HISTORY — PX: TOTAL KNEE ARTHROPLASTY: SHX125

## 2023-09-03 SURGERY — ARTHROPLASTY, KNEE, TOTAL
Anesthesia: Spinal | Site: Knee | Laterality: Right

## 2023-09-03 MED ORDER — ALBUMIN HUMAN 5 % IV SOLN
INTRAVENOUS | Status: DC | PRN
Start: 2023-09-03 — End: 2023-09-03

## 2023-09-03 MED ORDER — SODIUM CHLORIDE 0.9% FLUSH
INTRAVENOUS | Status: DC | PRN
  Administered 2023-09-03: 20 mL

## 2023-09-03 MED ORDER — IRRISEPT - 450ML BOTTLE WITH 0.05% CHG IN STERILE WATER, USP 99.95% OPTIME
TOPICAL | Status: DC | PRN
  Administered 2023-09-03: 450 mL via TOPICAL

## 2023-09-03 MED ORDER — CEFAZOLIN SODIUM-DEXTROSE 2-4 GM/100ML-% IV SOLN
2.0000 g | Freq: Four times a day (QID) | INTRAVENOUS | Status: AC
  Administered 2023-09-03: 2 g via INTRAVENOUS
  Filled 2023-09-03: qty 100

## 2023-09-03 MED ORDER — OXYCODONE HCL 5 MG PO TABS
5.0000 mg | ORAL_TABLET | ORAL | Status: DC | PRN
  Administered 2023-09-03: 10 mg via ORAL
  Administered 2023-09-03: 5 mg via ORAL
  Administered 2023-09-03: 10 mg via ORAL
  Filled 2023-09-03 (×2): qty 2

## 2023-09-03 MED ORDER — TRANEXAMIC ACID-NACL 1000-0.7 MG/100ML-% IV SOLN
1000.0000 mg | INTRAVENOUS | Status: AC
  Administered 2023-09-03: 1000 mg via INTRAVENOUS
  Filled 2023-09-03: qty 100

## 2023-09-03 MED ORDER — PROPOFOL 1000 MG/100ML IV EMUL
INTRAVENOUS | Status: AC
  Filled 2023-09-03: qty 100

## 2023-09-03 MED ORDER — ACETAMINOPHEN 10 MG/ML IV SOLN
1000.0000 mg | Freq: Once | INTRAVENOUS | Status: DC | PRN
  Administered 2023-09-03: 1000 mg via INTRAVENOUS

## 2023-09-03 MED ORDER — CITALOPRAM HYDROBROMIDE 40 MG PO TABS
40.0000 mg | ORAL_TABLET | Freq: Every day | ORAL | Status: DC
  Administered 2023-09-03 – 2023-09-04 (×2): 40 mg via ORAL
  Filled 2023-09-03 (×2): qty 2
  Filled 2023-09-03 (×3): qty 1

## 2023-09-03 MED ORDER — ACETAMINOPHEN 325 MG PO TABS: 325.0000 mg | ORAL_TABLET | Freq: Once | ORAL | Status: DC | PRN

## 2023-09-03 MED ORDER — ONDANSETRON HCL 4 MG/2ML IJ SOLN
INTRAMUSCULAR | Status: DC | PRN
  Administered 2023-09-03: 4 mg via INTRAVENOUS

## 2023-09-03 MED ORDER — MORPHINE SULFATE (PF) 4 MG/ML IV SOLN
INTRAVENOUS | Status: AC
  Filled 2023-09-03: qty 2

## 2023-09-03 MED ORDER — METOCLOPRAMIDE HCL 5 MG PO TABS: 5.0000 mg | ORAL_TABLET | Freq: Three times a day (TID) | ORAL | Status: DC | PRN

## 2023-09-03 MED ORDER — LORATADINE 10 MG PO TABS
10.0000 mg | ORAL_TABLET | Freq: Every day | ORAL | Status: DC
  Administered 2023-09-03 – 2023-09-05 (×3): 10 mg via ORAL
  Filled 2023-09-03 (×2): qty 1

## 2023-09-03 MED ORDER — PROPOFOL 10 MG/ML IV BOLUS
INTRAVENOUS | Status: DC | PRN
  Administered 2023-09-03 (×2): 30 mg via INTRAVENOUS
  Administered 2023-09-03: 40 mg via INTRAVENOUS
  Administered 2023-09-03: 20 mg via INTRAVENOUS
  Administered 2023-09-03 (×2): 40 mg via INTRAVENOUS

## 2023-09-03 MED ORDER — DOCUSATE SODIUM 100 MG PO CAPS
100.0000 mg | ORAL_CAPSULE | Freq: Two times a day (BID) | ORAL | Status: DC
  Administered 2023-09-03 – 2023-09-05 (×4): 100 mg via ORAL
  Filled 2023-09-03 (×5): qty 1

## 2023-09-03 MED ORDER — MORPHINE SULFATE (PF) 4 MG/ML IV SOLN
INTRAVENOUS | Status: DC | PRN
  Administered 2023-09-03: 8 mg

## 2023-09-03 MED ORDER — PRAMIPEXOLE DIHYDROCHLORIDE ER 0.75 MG PO TB24: 0.7500 mg | ORAL_TABLET | Freq: Every day | ORAL | Status: DC

## 2023-09-03 MED ORDER — VANCOMYCIN HCL 1000 MG IV SOLR
INTRAVENOUS | Status: AC
  Filled 2023-09-03: qty 20

## 2023-09-03 MED ORDER — GABAPENTIN 300 MG PO CAPS
300.0000 mg | ORAL_CAPSULE | Freq: Every day | ORAL | Status: DC
  Administered 2023-09-03 – 2023-09-04 (×2): 300 mg via ORAL
  Filled 2023-09-03 (×2): qty 1

## 2023-09-03 MED ORDER — ONDANSETRON HCL 4 MG/2ML IJ SOLN
INTRAMUSCULAR | Status: AC
  Filled 2023-09-03: qty 2

## 2023-09-03 MED ORDER — SODIUM CHLORIDE 0.9 % IV SOLN: INTRAVENOUS | Status: AC

## 2023-09-03 MED ORDER — PHENOL 1.4 % MT LIQD: 1.0000 | OROMUCOSAL | Status: DC | PRN

## 2023-09-03 MED ORDER — ASPIRIN 81 MG PO CHEW
81.0000 mg | CHEWABLE_TABLET | Freq: Two times a day (BID) | ORAL | Status: DC
  Administered 2023-09-03 – 2023-09-05 (×4): 81 mg via ORAL
  Filled 2023-09-03 (×4): qty 1

## 2023-09-03 MED ORDER — HYDROMORPHONE HCL 1 MG/ML IJ SOLN
INTRAMUSCULAR | Status: AC
  Filled 2023-09-03: qty 1

## 2023-09-03 MED ORDER — CLONIDINE HCL (ANALGESIA) 100 MCG/ML EP SOLN
EPIDURAL | Status: DC | PRN
  Administered 2023-09-03: 1 mL

## 2023-09-03 MED ORDER — ALBUMIN HUMAN 5 % IV SOLN
12.5000 g | Freq: Once | INTRAVENOUS | Status: AC
  Administered 2023-09-03: 12.5 g via INTRAVENOUS

## 2023-09-03 MED ORDER — ONDANSETRON HCL 4 MG PO TABS: 4.0000 mg | ORAL_TABLET | Freq: Four times a day (QID) | ORAL | Status: DC | PRN

## 2023-09-03 MED ORDER — MEPERIDINE HCL 25 MG/ML IJ SOLN: 6.2500 mg | INTRAMUSCULAR | Status: DC | PRN

## 2023-09-03 MED ORDER — BUPROPION HCL ER (XL) 150 MG PO TB24
150.0000 mg | ORAL_TABLET | Freq: Every day | ORAL | Status: DC
  Administered 2023-09-04 – 2023-09-05 (×2): 150 mg via ORAL
  Filled 2023-09-03 (×2): qty 1

## 2023-09-03 MED ORDER — BUPIVACAINE LIPOSOME 1.3 % IJ SUSP
INTRAMUSCULAR | Status: AC
  Filled 2023-09-03: qty 20

## 2023-09-03 MED ORDER — HYDROMORPHONE HCL 1 MG/ML IJ SOLN
0.5000 mg | INTRAMUSCULAR | Status: DC | PRN
  Administered 2023-09-03 – 2023-09-04 (×3): 0.5 mg via INTRAVENOUS
  Filled 2023-09-03 (×3): qty 0.5

## 2023-09-03 MED ORDER — ACETAMINOPHEN 500 MG PO TABS
1000.0000 mg | ORAL_TABLET | Freq: Four times a day (QID) | ORAL | Status: DC
  Filled 2023-09-03: qty 2

## 2023-09-03 MED ORDER — PROPOFOL 10 MG/ML IV BOLUS
INTRAVENOUS | Status: AC
  Filled 2023-09-03: qty 20

## 2023-09-03 MED ORDER — ACETAMINOPHEN 160 MG/5ML PO SOLN: 325.0000 mg | Freq: Once | ORAL | Status: DC | PRN

## 2023-09-03 MED ORDER — CLONIDINE HCL (ANALGESIA) 100 MCG/ML EP SOLN
EPIDURAL | Status: AC
  Filled 2023-09-03: qty 10

## 2023-09-03 MED ORDER — POVIDONE-IODINE 7.5 % EX SOLN
Freq: Once | CUTANEOUS | Status: DC
  Filled 2023-09-03: qty 118

## 2023-09-03 MED ORDER — METHOCARBAMOL 500 MG PO TABS
500.0000 mg | ORAL_TABLET | Freq: Four times a day (QID) | ORAL | Status: DC | PRN
  Administered 2023-09-03 – 2023-09-05 (×6): 500 mg via ORAL
  Filled 2023-09-03 (×5): qty 1

## 2023-09-03 MED ORDER — GLYCOPYRROLATE PF 0.2 MG/ML IJ SOSY
PREFILLED_SYRINGE | INTRAMUSCULAR | Status: DC | PRN
Start: 2023-09-03 — End: 2023-09-03
  Administered 2023-09-03: .2 mg via INTRAVENOUS

## 2023-09-03 MED ORDER — CELECOXIB 100 MG PO CAPS
100.0000 mg | ORAL_CAPSULE | Freq: Two times a day (BID) | ORAL | Status: DC
  Administered 2023-09-03 – 2023-09-05 (×4): 100 mg via ORAL
  Filled 2023-09-03 (×4): qty 1

## 2023-09-03 MED ORDER — MENTHOL 3 MG MT LOZG: 1.0000 | LOZENGE | OROMUCOSAL | Status: DC | PRN

## 2023-09-03 MED ORDER — FENTANYL CITRATE (PF) 250 MCG/5ML IJ SOLN
INTRAMUSCULAR | Status: AC
  Filled 2023-09-03: qty 5

## 2023-09-03 MED ORDER — OXYCODONE HCL 5 MG PO TABS
10.0000 mg | ORAL_TABLET | ORAL | Status: DC
  Administered 2023-09-04 (×2): 10 mg via ORAL
  Filled 2023-09-03 (×3): qty 2

## 2023-09-03 MED ORDER — POVIDONE-IODINE 10 % EX SWAB
2.0000 | Freq: Once | CUTANEOUS | Status: AC
  Administered 2023-09-03: 2 via TOPICAL

## 2023-09-03 MED ORDER — METHOCARBAMOL 500 MG PO TABS
ORAL_TABLET | ORAL | Status: AC
  Filled 2023-09-03: qty 1

## 2023-09-03 MED ORDER — TRANEXAMIC ACID 1000 MG/10ML IV SOLN
INTRAVENOUS | Status: DC | PRN
  Administered 2023-09-03: 2000 mg via TOPICAL

## 2023-09-03 MED ORDER — CEFAZOLIN SODIUM-DEXTROSE 2-4 GM/100ML-% IV SOLN
2.0000 g | INTRAVENOUS | Status: AC
  Administered 2023-09-03: 2 g via INTRAVENOUS
  Filled 2023-09-03: qty 100

## 2023-09-03 MED ORDER — EPHEDRINE SULFATE-NACL 50-0.9 MG/10ML-% IV SOSY
PREFILLED_SYRINGE | INTRAVENOUS | Status: DC | PRN
  Administered 2023-09-03 (×5): 5 mg via INTRAVENOUS

## 2023-09-03 MED ORDER — VANCOMYCIN HCL 1000 MG IV SOLR
INTRAVENOUS | Status: DC | PRN
  Administered 2023-09-03: 1000 mg via TOPICAL

## 2023-09-03 MED ORDER — ACETAMINOPHEN 500 MG PO TABS
1000.0000 mg | ORAL_TABLET | Freq: Four times a day (QID) | ORAL | Status: AC
  Administered 2023-09-03 – 2023-09-04 (×3): 1000 mg via ORAL
  Filled 2023-09-03 (×3): qty 2

## 2023-09-03 MED ORDER — METHOCARBAMOL 1000 MG/10ML IJ SOLN: 500.0000 mg | Freq: Four times a day (QID) | INTRAVENOUS | Status: DC | PRN

## 2023-09-03 MED ORDER — 0.9 % SODIUM CHLORIDE (POUR BTL) OPTIME
TOPICAL | Status: DC | PRN
  Administered 2023-09-03 (×5): 1000 mL

## 2023-09-03 MED ORDER — ALBUMIN HUMAN 5 % IV SOLN
INTRAVENOUS | Status: AC
  Filled 2023-09-03: qty 250

## 2023-09-03 MED ORDER — DROPERIDOL 2.5 MG/ML IJ SOLN: 0.6250 mg | Freq: Once | INTRAMUSCULAR | Status: DC | PRN

## 2023-09-03 MED ORDER — POVIDONE-IODINE 10 % EX SWAB: 2.0000 | Freq: Once | CUTANEOUS | Status: DC

## 2023-09-03 MED ORDER — BUPIVACAINE HCL (PF) 0.25 % IJ SOLN
INTRAMUSCULAR | Status: AC
  Filled 2023-09-03: qty 30

## 2023-09-03 MED ORDER — PRAMIPEXOLE DIHYDROCHLORIDE 0.25 MG PO TABS
0.5000 mg | ORAL_TABLET | Freq: Every day | ORAL | Status: DC | PRN
  Filled 2023-09-03: qty 2

## 2023-09-03 MED ORDER — BUPIVACAINE HCL 0.25 % IJ SOLN
INTRAMUSCULAR | Status: DC | PRN
  Administered 2023-09-03: 30 mL

## 2023-09-03 MED ORDER — SODIUM CHLORIDE 0.9 % IR SOLN
Status: DC | PRN
  Administered 2023-09-03: 3000 mL

## 2023-09-03 MED ORDER — ACETAMINOPHEN 325 MG PO TABS
325.0000 mg | ORAL_TABLET | Freq: Four times a day (QID) | ORAL | Status: DC | PRN
  Administered 2023-09-04 – 2023-09-05 (×2): 650 mg via ORAL
  Filled 2023-09-03 (×2): qty 2

## 2023-09-03 MED ORDER — LACTATED RINGERS IV SOLN: INTRAVENOUS | Status: DC

## 2023-09-03 MED ORDER — BUPIVACAINE LIPOSOME 1.3 % IJ SUSP
INTRAMUSCULAR | Status: DC | PRN
  Administered 2023-09-03: 20 mL

## 2023-09-03 MED ORDER — PRAMIPEXOLE DIHYDROCHLORIDE 0.25 MG PO TABS
0.5000 mg | ORAL_TABLET | Freq: Every day | ORAL | Status: DC
  Administered 2023-09-03 – 2023-09-04 (×2): 0.5 mg via ORAL
  Filled 2023-09-03 (×2): qty 2

## 2023-09-03 MED ORDER — CHLORHEXIDINE GLUCONATE 0.12 % MT SOLN
15.0000 mL | Freq: Once | OROMUCOSAL | Status: AC
  Administered 2023-09-03: 15 mL via OROMUCOSAL
  Filled 2023-09-03: qty 15

## 2023-09-03 MED ORDER — PHENYLEPHRINE HCL-NACL 20-0.9 MG/250ML-% IV SOLN
INTRAVENOUS | Status: DC | PRN
  Administered 2023-09-03: 15 ug/min via INTRAVENOUS

## 2023-09-03 MED ORDER — HYDROMORPHONE HCL 1 MG/ML IJ SOLN: 0.2500 mg | INTRAMUSCULAR | Status: DC | PRN

## 2023-09-03 MED ORDER — OXYCODONE HCL 5 MG PO TABS
ORAL_TABLET | ORAL | Status: AC
  Filled 2023-09-03: qty 1

## 2023-09-03 MED ORDER — ONDANSETRON HCL 4 MG/2ML IJ SOLN: 4.0000 mg | Freq: Four times a day (QID) | INTRAMUSCULAR | Status: DC | PRN

## 2023-09-03 MED ORDER — PROPOFOL 500 MG/50ML IV EMUL
INTRAVENOUS | Status: DC | PRN
  Administered 2023-09-03: 15 ug/kg/min via INTRAVENOUS

## 2023-09-03 MED ORDER — MIDAZOLAM HCL 2 MG/2ML IJ SOLN
INTRAMUSCULAR | Status: DC | PRN
  Administered 2023-09-03 (×2): 1 mg via INTRAVENOUS

## 2023-09-03 MED ORDER — HYDROMORPHONE HCL 1 MG/ML IJ SOLN
0.2500 mg | INTRAMUSCULAR | Status: DC | PRN
  Administered 2023-09-03 (×2): 0.5 mg via INTRAVENOUS

## 2023-09-03 MED ORDER — FENTANYL CITRATE (PF) 250 MCG/5ML IJ SOLN
INTRAMUSCULAR | Status: DC | PRN
  Administered 2023-09-03 (×2): 50 ug via INTRAVENOUS

## 2023-09-03 MED ORDER — ORAL CARE MOUTH RINSE: 15.0000 mL | Freq: Once | OROMUCOSAL | Status: AC

## 2023-09-03 MED ORDER — BUPIVACAINE IN DEXTROSE 0.75-8.25 % IT SOLN
INTRATHECAL | Status: DC | PRN
Start: 2023-09-03 — End: 2023-09-03
  Administered 2023-09-03: 2 mL via INTRATHECAL

## 2023-09-03 MED ORDER — ACETAMINOPHEN 10 MG/ML IV SOLN
INTRAVENOUS | Status: AC
  Filled 2023-09-03: qty 100

## 2023-09-03 MED ORDER — METOCLOPRAMIDE HCL 5 MG/ML IJ SOLN: 5.0000 mg | Freq: Three times a day (TID) | INTRAMUSCULAR | Status: DC | PRN

## 2023-09-03 MED ORDER — GLYCOPYRROLATE PF 0.2 MG/ML IJ SOSY
PREFILLED_SYRINGE | INTRAMUSCULAR | Status: AC
  Filled 2023-09-03: qty 1

## 2023-09-03 MED ORDER — MIDAZOLAM HCL 2 MG/2ML IJ SOLN
INTRAMUSCULAR | Status: AC
  Filled 2023-09-03: qty 2

## 2023-09-03 MED ORDER — ROPIVACAINE HCL 5 MG/ML IJ SOLN
INTRAMUSCULAR | Status: DC | PRN
Start: 2023-09-03 — End: 2023-09-03
  Administered 2023-09-03: 30 mL via PERINEURAL

## 2023-09-03 SURGICAL SUPPLY — 88 items
BAG COUNTER SPONGE SURGICOUNT (BAG) ×1 IMPLANT
BAG DECANTER FOR FLEXI CONT (MISCELLANEOUS) ×1 IMPLANT
BAG SPNG CNTER NS LX DISP (BAG) ×1
BANDAGE ESMARK 6X9 LF (GAUZE/BANDAGES/DRESSINGS) ×1 IMPLANT
BLADE SAG 18X100X1.27 (BLADE) ×1 IMPLANT
BLADE SAGITTAL (BLADE) ×1
BLADE SAW THK.89X75X18XSGTL (BLADE) ×1 IMPLANT
BNDG CMPR 5X6 CHSV STRCH STRL (GAUZE/BANDAGES/DRESSINGS)
BNDG CMPR 9X6 STRL LF SNTH (GAUZE/BANDAGES/DRESSINGS)
BNDG CMPR MED 15X6 ELC VLCR LF (GAUZE/BANDAGES/DRESSINGS)
BNDG COHESIVE 6X5 TAN ST LF (GAUZE/BANDAGES/DRESSINGS) ×1 IMPLANT
BNDG ELASTIC 6X15 VLCR STRL LF (GAUZE/BANDAGES/DRESSINGS) ×1 IMPLANT
BNDG ESMARK 6X9 LF (GAUZE/BANDAGES/DRESSINGS)
BOWL SMART MIX CTS (DISPOSABLE) IMPLANT
CEMENT BONE SIMPLEX SPEEDSET (Cement) IMPLANT
CNTNR URN SCR LID CUP LEK RST (MISCELLANEOUS) ×1 IMPLANT
COMP FEM PS KNEE NRW 7 RT (Joint) ×1 IMPLANT
COMP PATELLA PEG 3 32 (Joint) ×1 IMPLANT
COMP TIB PS KNEE 0D E RT (Joint) ×1 IMPLANT
COMPONENT FEM PS KNEE NRW 7 RT (Joint) IMPLANT
COMPONENT PATELLA PEG 3 32 (Joint) IMPLANT
COMPONET TIB PS KNEE 0D E RT (Joint) IMPLANT
CONT SPEC 4OZ STRL OR WHT (MISCELLANEOUS) ×1
COOLER ICEMAN CLASSIC (MISCELLANEOUS) IMPLANT
COVER SURGICAL LIGHT HANDLE (MISCELLANEOUS) ×1 IMPLANT
CUFF TOURN SGL QUICK 34 (TOURNIQUET CUFF) ×1
CUFF TOURN SGL QUICK 42 (TOURNIQUET CUFF) IMPLANT
CUFF TRNQT CYL 34X4.125X (TOURNIQUET CUFF) ×1 IMPLANT
DRAPE INCISE IOBAN 66X45 STRL (DRAPES) IMPLANT
DRAPE ORTHO SPLIT 77X108 STRL (DRAPES) ×2
DRAPE SURG ORHT 6 SPLT 77X108 (DRAPES) ×3 IMPLANT
DRAPE U-SHAPE 47X51 STRL (DRAPES) ×1 IMPLANT
DRSG AQUACEL AG ADV 3.5X14 (GAUZE/BANDAGES/DRESSINGS) IMPLANT
DURAPREP 26ML APPLICATOR (WOUND CARE) ×2 IMPLANT
ELECT CAUTERY BLADE 6.4 (BLADE) ×1 IMPLANT
ELECT REM PT RETURN 9FT ADLT (ELECTROSURGICAL) ×1
ELECTRODE REM PT RTRN 9FT ADLT (ELECTROSURGICAL) ×1 IMPLANT
GAUZE SPONGE 4X4 12PLY STRL (GAUZE/BANDAGES/DRESSINGS) ×1 IMPLANT
GLOVE BIOGEL PI IND STRL 7.0 (GLOVE) ×1 IMPLANT
GLOVE BIOGEL PI IND STRL 8 (GLOVE) ×1 IMPLANT
GLOVE ECLIPSE 7.0 STRL STRAW (GLOVE) ×1 IMPLANT
GLOVE ECLIPSE 8.0 STRL XLNG CF (GLOVE) ×1 IMPLANT
GLOVE SURG ENC MOIS LTX SZ6.5 (GLOVE) ×3 IMPLANT
GOWN STRL REUS W/ TWL LRG LVL3 (GOWN DISPOSABLE) ×3 IMPLANT
GOWN STRL REUS W/TWL LRG LVL3 (GOWN DISPOSABLE) ×4
HANDPIECE INTERPULSE COAX TIP (DISPOSABLE) ×1
HOOD PEEL AWAY T7 (MISCELLANEOUS) ×3 IMPLANT
IMMOBILIZER KNEE 20 (SOFTGOODS)
IMMOBILIZER KNEE 20 THIGH 36 (SOFTGOODS) IMPLANT
IMMOBILIZER KNEE 22 UNIV (SOFTGOODS) IMPLANT
IMMOBILIZER KNEE 24 THIGH 36 (MISCELLANEOUS) IMPLANT
IMMOBILIZER KNEE 24 UNIV (MISCELLANEOUS)
INSERT TIB ARTISURF SZ6-7 R 11 (Joint) IMPLANT
KIT BASIN OR (CUSTOM PROCEDURE TRAY) ×1 IMPLANT
KIT TURNOVER KIT B (KITS) ×1 IMPLANT
MANIFOLD NEPTUNE II (INSTRUMENTS) ×1 IMPLANT
NDL 22X1.5 STRL (OR ONLY) (MISCELLANEOUS) ×2 IMPLANT
NDL SPNL 18GX3.5 QUINCKE PK (NEEDLE) ×1 IMPLANT
NEEDLE 22X1.5 STRL (OR ONLY) (MISCELLANEOUS) ×1 IMPLANT
NEEDLE SPNL 18GX3.5 QUINCKE PK (NEEDLE) ×1 IMPLANT
NS IRRIG 1000ML POUR BTL (IV SOLUTION) ×2 IMPLANT
PACK TOTAL JOINT (CUSTOM PROCEDURE TRAY) ×1 IMPLANT
PAD ARMBOARD 7.5X6 YLW CONV (MISCELLANEOUS) ×2 IMPLANT
PAD CAST 4YDX4 CTTN HI CHSV (CAST SUPPLIES) ×1 IMPLANT
PAD COLD SHLDR WRAP-ON (PAD) IMPLANT
PADDING CAST COTTON 4X4 STRL (CAST SUPPLIES)
PADDING CAST COTTON 6X4 STRL (CAST SUPPLIES) ×1 IMPLANT
PIN DRILL HDLS TROCAR 75 4PK (PIN) IMPLANT
SCREW FEMALE HEX FIX 25X2.5 (ORTHOPEDIC DISPOSABLE SUPPLIES) IMPLANT
SET HNDPC FAN SPRY TIP SCT (DISPOSABLE) ×1 IMPLANT
SPIKE FLUID TRANSFER (MISCELLANEOUS) ×1 IMPLANT
STRIP CLOSURE SKIN 1/2X4 (GAUZE/BANDAGES/DRESSINGS) ×2 IMPLANT
SUCTION TUBE FRAZIER 10FR DISP (SUCTIONS) ×1 IMPLANT
SUT MNCRL AB 3-0 PS2 18 (SUTURE) ×1 IMPLANT
SUT VIC AB 0 CT1 27 (SUTURE) ×4
SUT VIC AB 0 CT1 27XBRD ANBCTR (SUTURE) ×3 IMPLANT
SUT VIC AB 1 CT1 27 (SUTURE) ×2
SUT VIC AB 1 CT1 27XBRD ANBCTR (SUTURE) IMPLANT
SUT VIC AB 1 CT1 36 (SUTURE) ×5 IMPLANT
SUT VIC AB 2-0 CT1 27 (SUTURE) ×4
SUT VIC AB 2-0 CT1 TAPERPNT 27 (SUTURE) ×4 IMPLANT
SYR 30ML LL (SYRINGE) ×3 IMPLANT
SYR TB 1ML LUER SLIP (SYRINGE) ×1 IMPLANT
TOWEL GREEN STERILE (TOWEL DISPOSABLE) ×2 IMPLANT
TOWEL GREEN STERILE FF (TOWEL DISPOSABLE) ×2 IMPLANT
TRAY CATH INTERMITTENT SS 16FR (CATHETERS) IMPLANT
WATER STERILE IRR 1000ML POUR (IV SOLUTION) IMPLANT
YANKAUER SUCT BULB TIP NO VENT (SUCTIONS) ×1 IMPLANT

## 2023-09-03 NOTE — Transfer of Care (Signed)
Immediate Anesthesia Transfer of Care Note  Patient: Alexis Fox  Procedure(s) Performed: RIGHT TOTAL KNEE ARTHROPLASTY (Right: Knee)  Patient Location: PACU  Anesthesia Type:Spinal  Level of Consciousness: awake, alert , oriented, patient cooperative, and responds to stimulation  Airway & Oxygen Therapy: Patient Spontanous Breathing  Post-op Assessment: Report given to RN and Post -op Vital signs reviewed and stable  Post vital signs: Reviewed and stable  Last Vitals:  Vitals Value Taken Time  BP 89/57 09/03/23 1042  Temp    Pulse 71 09/03/23 1044  Resp 13 09/03/23 1044  SpO2 92 % 09/03/23 1044  Vitals shown include unfiled device data.  Last Pain:  Vitals:   09/03/23 0637  TempSrc:   PainSc: 2          Complications: No notable events documented.

## 2023-09-03 NOTE — Plan of Care (Signed)
  Problem: Education: Goal: Knowledge of General Education information will improve Description: Including pain rating scale, medication(s)/side effects and non-pharmacologic comfort measures Outcome: Progressing   Problem: Clinical Measurements: Goal: Will remain free from infection Outcome: Progressing   Problem: Activity: Goal: Risk for activity intolerance will decrease Outcome: Progressing   Problem: Nutrition: Goal: Adequate nutrition will be maintained Outcome: Progressing   Problem: Coping: Goal: Level of anxiety will decrease Outcome: Progressing   Problem: Elimination: Goal: Will not experience complications related to bowel motility Outcome: Progressing   Problem: Pain Managment: Goal: General experience of comfort will improve Outcome: Progressing   

## 2023-09-03 NOTE — Telephone Encounter (Signed)
Monyette LPN from [facility] Floor 5 th N orth. Pt witness fall to floor. Knee was not bent. Phone number is [phone]. Walking to restroom with walker.

## 2023-09-03 NOTE — Progress Notes (Signed)
Patient having postop pain and knee She did not bring her CPAP Would consider increasing pain medicine but would like for her to have CPAP overnight Have consulted respiratory therapy and they will provide her with CPAP overnight and we will go with oxycodone every 3 hours as opposed to 4 hours in this scenario. Patient did have a witnessed sliding "fall" earlier today There was no impact to the knee We will recheck in a.m. and decide for or against radiographs

## 2023-09-03 NOTE — Telephone Encounter (Signed)
Added note. Time of 18 49

## 2023-09-03 NOTE — Brief Op Note (Signed)
   09/03/2023  12:41 PM  PATIENT:  Alexis Fox  67 y.o. female  PRE-OPERATIVE DIAGNOSIS:  right knee osteoarthritis  POST-OPERATIVE DIAGNOSIS:  right knee osteoarthritis  PROCEDURE:  Procedure(s): RIGHT TOTAL KNEE ARTHROPLASTY  SURGEON:  Surgeon(s): Cammy Copa, MD  ASSISTANT: Magnant PA  ANESTHESIA:   Spinal  EBL: 100 ml    Total I/O In: 1750 [I.V.:1300; IV Piggyback:450] Out: 200 [Urine:100; Blood:100]  BLOOD ADMINISTERED: none  DRAINS: None  LOCAL MEDICATIONS USED: Marcaine morphine clonidine Exparel vancomycin powder  SPECIMEN:  No Specimen  COUNTS:  YES  TOURNIQUET:   Total Tourniquet Time Documented: Thigh (Right) - 91 minutes Total: Thigh (Right) - 91 minutes   DICTATION: .Other Dictation: Dictation Number [id number]  PLAN OF CARE: Admit for overnight observation  PATIENT DISPOSITION:  PACU - hemodynamically stable

## 2023-09-03 NOTE — H&P (Addendum)
TOTAL KNEE ADMISSION H&P  Patient is being admitted for right total knee arthroplasty.  Subjective:  Chief Complaint:right knee pain.  HPI: Alexis Fox, 67 y.o. female, has a history of pain and functional disability in the right knee due to arthritis and has failed non-surgical conservative treatments for greater than 12 weeks to includeNSAID's and/or analgesics, corticosteriod injections, viscosupplementation injections, and activity modification.  Onset of symptoms was gradual, starting 8 years ago with gradually worsening course since that time. The patient noted no past surgery on the right knee(s).  Patient currently rates pain in the right knee(s) at 8 out of 10 with activity. Patient has night pain, worsening of pain with activity and weight bearing, pain that interferes with activities of daily living, pain with passive range of motion, crepitus, and joint swelling.  Patient has evidence of subchondral sclerosis and joint space narrowing by imaging studies. This patient has had  a long course of non op treatment. No personal or family h/o pe or dvt. .Retired Engineer, civil (consulting). Sxs do  not appear to be coming from back There is no active infection.  Patient Active Problem List   Diagnosis Date Noted   Unilateral primary osteoarthritis, right knee 12/28/2022   Pain in left ankle and joints of left foot 09/13/2022   Hypersomnia, persistent 08/21/2022   Excessive daytime sleepiness 08/21/2022   MCI (mild cognitive impairment) 08/21/2022   History of breast cancer 08/21/2022   OSA on CPAP 08/21/2022   Pain in left shoulder 03/01/2021   Allergic rhinitis 02/21/2021   Anxiety 02/21/2021   Contact dermatitis due to plant 02/21/2021   Dysphagia 02/21/2021   Estrogen receptor negative status (ER-) 02/21/2021   Gastroesophageal reflux disease 02/21/2021   Gastroparesis 02/21/2021   Insomnia 02/21/2021   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   Morton's neuroma of left foot  02/21/2021   Personal history of malignant neoplasm of breast 02/21/2021   Vitamin D deficiency 02/21/2021   Oth nondisplaced dens fracture, init for clos fx (HCC) 02/04/2020   S/P cervical spinal fusion 01/22/2020   Memory deficit 01/14/2020   Sacroiliac dysfunction 01/14/2020   Neck pain 11/05/2019   Body mass index (BMI) 25.0-25.9, adult 11/05/2019   Unilateral primary osteoarthritis, right hip 09/01/2019   Trochanteric bursitis, left hip 07/14/2019   S/P lumbar fusion 05/18/2019   OSA (obstructive sleep apnea) 10/28/2018   Pain managed using patient-controlled analgesia (PCA) 10/25/2018   Fall 10/24/2018   Fall from ladder 10/23/2018   DDD (degenerative disc disease), lumbosacral 09/15/2018   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   Central line complication 10/22/2016   Genetic testing 05/15/2016   Family history of breast cancer    Family history of brain cancer    Breast cancer of upper-outer quadrant of left female breast (HCC) 03/20/2016   Transaminitis 10/03/2015   Symptomatic cholelithiasis 10/03/2015   RUQ abdominal pain    Lumbosacral neuritis 11/10/2014   Lumbar radiculopathy 10/19/2014   Depression 06/24/2013   RLS (restless legs syndrome) 06/24/2013   Low back pain 05/19/2013   Past Medical History:  Diagnosis Date   Anxiety    Arthritis    knees   Breast cancer (HCC)    Breast cancer of upper-outer quadrant of left female breast (HCC) 03/20/2016   C2 cervical fracture (HCC)    10/22/18, following fall from ladder   Cervicalgia    Cholecystitis    Complication of anesthesia    BP "crashes" post op   Early cataracts, bilateral  Family history of brain cancer    Family history of breast cancer    Hot flashes    Personal history of chemotherapy    Personal history of radiation therapy    Restless leg syndrome    Sleep apnea 10/03/2018   wears CPAP    Past Surgical History:  Procedure Laterality Date   ANTERIOR CERVICAL  DECOMP/DISCECTOMY FUSION N/A 01/22/2020   Procedure: ACDF - C2-C3;  Surgeon: Tia Alert, MD;  Location: [facility] OR;  Service: Neurosurgery;  Laterality: N/A;   BACK SURGERY     x2   BREAST BIOPSY     BREAST LUMPECTOMY Left 2018   BUNIONECTOMY     CHOLECYSTECTOMY N/A 10/04/2015   Procedure: LAPAROSCOPIC CHOLECYSTECTOMY WITH INTRAOPERATIVE CHOLANGIOGRAM;  Surgeon: Gaynelle Adu, MD;  Location: [facility] OR;  Service: General;  Laterality: N/A;   ELBOW SURGERY     right for epicondylitis 2010    ESOPHAGEAL MANOMETRY N/A 11/30/2020   Procedure: ESOPHAGEAL MANOMETRY (EM);  Surgeon: Kerin Salen, MD;  Location: [facility];  Service: Gastroenterology;  Laterality: N/A;   FOOT SURGERY     1983 -tarsal tunnel release   IR GENERIC HISTORICAL  10/26/2016   IR CV LINE INJECTION 10/26/2016 [facility]-INTERV RAD   LUMBAR DISC SURGERY  04/04/2012   MASS EXCISION Left 02/06/2021   Procedure: EXCISION LEFT FOOT SOFT TISSUE BETWEEN SECOND AND THIRD AND THIRD AND FOURTH TOES;  Surgeon: Asencion Islam, DPM;  Location: [facility];  Service: Podiatry;  Laterality: Left;   PORT-A-CATH REMOVAL N/A 11/15/2016   Procedure: REMOVAL PORT-A-CATH;  Surgeon: Emelia Loron, MD;  Location: [facility];  Service: General;  Laterality: N/A;   PORTACATH PLACEMENT Right 04/02/2016   Procedure: INSERTION PORT-A-CATH WITH [place];  Surgeon: Emelia Loron, MD;  Location: [facility];  Service: General;  Laterality: Right;   RADIOACTIVE SEED GUIDED PARTIAL MASTECTOMY/AXILLARY SENTINEL NODE BIOPSY/AXILLARY NODE DISSECTION Left 09/12/2016   Procedure: LEFT BREAST SEED GUIDED LUMPECTOMY, LEFT AXILLARY SENTINEL NODE BIOPSY, LEFT SEED GUIDED AXILLARY NODE EXCISION( TARGETED AXILLARY DISSECTION), BLUE DYE INJECTION;  Surgeon: Emelia Loron, MD;  Location: [facility];  Service: General;  Laterality: Left;   SACROILIAC JOINT FUSION Left 05/18/2020   Procedure: LEFT SACROILIAC JOINT FUSION;   Surgeon: Estill Bamberg, MD;  Location: [facility] OR;  Service: Orthopedics;  Laterality: Left;   SPINAL FUSION  5/12   L5-S1   SPINAL FUSION  05/18/2019   L4-5   TARSAL TUNNEL RELEASE      Current Facility-Administered Medications  Medication Dose Route Frequency Provider Last Rate Last Admin   ceFAZolin (ANCEF) IVPB 2g/100 mL premix  2 g Intravenous On Call to OR Magnant, Joycie Peek, PA-C       chlorhexidine (PERIDEX) 0.12 % solution 15 mL  15 mL Mouth/Throat Once Beryle Lathe, MD       Or   Oral care mouth rinse  15 mL Mouth Rinse Once Beryle Lathe, MD       lactated ringers infusion   Intravenous Continuous Beryle Lathe, MD       povidone-iodine (BETADINE) 7.5 % scrub   Topical Once Magnant, Charles L, PA-C       povidone-iodine 10 % swab 2 Application  2 Application Topical Once Magnant, Charles L, PA-C       povidone-iodine 10 % swab 2 Application  2 Application Topical Once Magnant, Charles L, PA-C       tranexamic acid (CYKLOKAPRON) 2,000 mg in sodium  chloride 0.9 % 50 mL Topical Application  2,000 mg Topical To OR August Saucer, Corrie Mckusick, MD       tranexamic acid (CYKLOKAPRON) IVPB 1,000 mg  1,000 mg Intravenous To OR Magnant, Charles L, PA-C       Facility-Administered Medications Ordered in Other Encounters  Medication Dose Route Frequency Provider Last Rate Last Admin   sodium chloride flush (NS) 0.9 % injection 10 mL  10 mL Intracatheter PRN Serena Croissant, MD   10 mL at 07/19/16 1434   Allergies  Allergen Reactions   Turmeric Diarrhea    Severe diarrhea   Decadron [Dexamethasone]     Exacerbates restless leg    Social History   Tobacco Use   Smoking status: Never   Smokeless tobacco: Never  Substance Use Topics   Alcohol use: Yes    Alcohol/week: 1.0 standard drink of alcohol    Types: 1 Cans of beer per week    Comment: Rarely 1/month    Family History  Problem Relation Age of Onset   Heart disease Mother    Pulmonary fibrosis Mother    Hypertension Mother     Sleep apnea Mother    Cancer Father 73       astocytoma   Hypertension Brother    Lymphoma Maternal Aunt    Anesthesia problems Neg Hx      Review of Systems  Musculoskeletal:  Positive for arthralgias.  All other systems reviewed and are negative.   Objective:  Physical Exam Vitals reviewed.  HENT:     Head: Normocephalic.     Nose: Nose normal.     Mouth/Throat:     Mouth: Mucous membranes are moist.  Cardiovascular:     Rate and Rhythm: Normal rate.     Pulses: Normal pulses.  Pulmonary:     Effort: Pulmonary effort is normal.  Abdominal:     General: Abdomen is flat.  Musculoskeletal:     Cervical back: Normal range of motion.  Skin:    General: Skin is warm.     Capillary Refill: Capillary refill takes less than 2 seconds.  Neurological:     General: No focal deficit present.     Mental Status: She is alert.  Psychiatric:        Mood and Affect: Mood normal.   Ortho exam demonstrates slight varus alignment in that right leg. Pedal pulses palpable. Patient has 5 out of 5 ankle dorsiflexion plantarflexion quad and hamstring strength. Range of motion is about 0-1 20. Has medial greater than lateral joint line tenderness with significant patellofemoral crepitus as well as stable collateral and cruciate ligaments. Trace effusion is present in the right knee. No effusion in the left knee.  Patient does report having poison ivy in the right leg over a month ago.  Treated with steroids.  There are no areas that have not healed over and the areas that have healed over are far enough away from the planned incision that it should not be a problem. Vital signs in last 24 hours: Temp:  [98.4 F (36.9 C)] 98.4 F (36.9 C) (10/01 0603) Pulse Rate:  [68] 68 (10/01 0603) Resp:  [17] 17 (10/01 0603) BP: (108)/(66) 108/66 (10/01 0603) SpO2:  [94 %] 94 % (10/01 0603) Weight:  [94.3 kg] 94.3 kg (10/01 0603)  Labs:   Estimated body mass index is 35.7 kg/m as calculated from the  following:   Height as of this encounter: 5\' 4"  (1.626 m).   Weight as  of this encounter: 94.3 kg.   Imaging Review Plain radiographs demonstrate severe degenerative joint disease of the right knee(s). The overall alignment ismild varus. The bone quality appears to be good for age and reported activity level.      Assessment/Plan:  End stage arthritis, right knee   The patient history, physical examination, clinical judgment of the provider and imaging studies are consistent with end stage degenerative joint disease of the right knee(s) and total knee arthroplasty is deemed medically necessary. The treatment options including medical management, injection therapy arthroscopy and arthroplasty were discussed at length. The risks and benefits of total knee arthroplasty were presented and reviewed. The risks due to aseptic loosening, infection, stiffness, patella tracking problems, thromboembolic complications and other imponderables were discussed. The patient acknowledged the explanation, agreed to proceed with the plan and consent was signed. Patient is being admitted for inpatient treatment for surgery, pain control, PT, OT, prophylactic antibiotics, VTE prophylaxis, progressive ambulation and ADL's and discharge planning. The patient is planning to be discharged home with home health services     Patient's anticipated LOS is less than 2 midnights, meeting these requirements: - Younger than 18 - Lives within 1 hour of care - Has a competent adult at home to recover with post-op recover - NO history of  - Chronic pain requiring opiods  - Diabetes  - Coronary Artery Disease  - Heart failure  - Heart attack  - Stroke  - DVT/VTE  - Cardiac arrhythmia  - Respiratory Failure/COPD  - Renal failure  - Anemia  - Advanced Liver disease

## 2023-09-03 NOTE — Anesthesia Preprocedure Evaluation (Addendum)
Anesthesia Evaluation  Patient identified by MRN, date of birth, ID band Patient awake    Reviewed: Allergy & Precautions, NPO status , Patient's Chart, lab work & pertinent test results  Airway Mallampati: II  TM Distance: >3 FB Neck ROM: Full    Dental  (+) Teeth Intact, Dental Advisory Given   Pulmonary sleep apnea    breath sounds clear to auscultation       Cardiovascular negative cardio ROS  Rhythm:Regular Rate:Normal     Neuro/Psych  PSYCHIATRIC DISORDERS Anxiety Depression     Neuromuscular disease    GI/Hepatic Neg liver ROS,GERD  ,,  Endo/Other  negative endocrine ROS    Renal/GU negative Renal ROS     Musculoskeletal  (+) Arthritis ,    Abdominal   Peds  Hematology negative hematology ROS (+)   Anesthesia Other Findings   Reproductive/Obstetrics                             Anesthesia Physical Anesthesia Plan  ASA: 2  Anesthesia Plan: Spinal   Post-op Pain Management: Regional block*   Induction: Intravenous  PONV Risk Score and Plan: 3 and Ondansetron, Propofol infusion, Midazolam and Dexamethasone  Airway Management Planned: Natural Airway and Nasal Cannula  Additional Equipment: None  Intra-op Plan:   Post-operative Plan:   Informed Consent: I have reviewed the patients History and Physical, chart, labs and discussed the procedure including the risks, benefits and alternatives for the proposed anesthesia with the patient or authorized representative who has indicated his/her understanding and acceptance.       Plan Discussed with: CRNA  Anesthesia Plan Comments: (Lab Results      Component                Value               Date                      WBC                      8.0                 08/21/2023                HGB                      12.9                08/21/2023                HCT                      40.9                08/21/2023                 MCV                      94.2                08/21/2023                PLT                      254  08/21/2023           )       Anesthesia Quick Evaluation

## 2023-09-03 NOTE — Progress Notes (Signed)
PT Cancellation Note  Patient Details Name: Alexis Fox MRN: [id number] DOB: [birth year]   Cancelled Treatment:    Reason Eval/Treat Not Completed: Other (comment). Pt with fall amb to bathroom. Will see in AM.    [name] [facility] 09/03/2023, 3:47 PM Skip Mayer PT Acute Rehabilitation Services Office [phone]

## 2023-09-03 NOTE — Anesthesia Postprocedure Evaluation (Signed)
Anesthesia Post Note  Patient: Alexis Fox  Procedure(s) Performed: RIGHT TOTAL KNEE ARTHROPLASTY (Right: Knee)     Patient location during evaluation: PACU Anesthesia Type: Spinal Level of consciousness: oriented and awake and alert Pain management: pain level controlled Vital Signs Assessment: post-procedure vital signs reviewed and stable Respiratory status: spontaneous breathing, respiratory function stable and patient connected to nasal cannula oxygen Cardiovascular status: blood pressure returned to baseline and stable Postop Assessment: no headache, no backache and no apparent nausea or vomiting Anesthetic complications: no  No notable events documented.  Last Vitals:  Vitals:   09/03/23 1200 09/03/23 1232  BP: (!) 96/53 (!) 83/58  Pulse: (!) 56 (!) 53  Resp: 13 17  Temp: (!) 36.2 C (!) 36.3 C  SpO2: 99% 96%    Last Pain:  Vitals:   09/03/23 1232  TempSrc: Oral  PainSc:                  Shelton Silvas

## 2023-09-03 NOTE — Anesthesia Procedure Notes (Addendum)
Anesthesia Regional Block: Adductor canal block   Pre-Anesthetic Checklist: , timeout performed,  Correct Patient, Correct Site, Correct Laterality,  Correct Procedure, Correct Position, site marked,  Risks and benefits discussed,  Surgical consent,  Pre-op evaluation,  At surgeon's request and post-op pain management  Laterality: Right  Prep: chloraprep       Needles:  Injection technique: Single-shot  Needle Type: Echogenic Stimulator Needle     Needle Length: 9cm  Needle Gauge: 21     Additional Needles:   Procedures:,,,, ultrasound used (permanent image in chart),,    Narrative:  Start time: 09/03/2023 7:10 AM End time: 09/03/2023 7:15 AM Injection made incrementally with aspirations every 5 mL.  Performed by: Personally  Anesthesiologist: Shelton Silvas, MD  Additional Notes: Discussed risks and benefits of the nerve block in detail, including but not limited vascular injury, permanent nerve damage and infection.   Patient tolerated the procedure well. Local anesthetic introduced in an incremental fashion under minimal resistance after negative aspirations. No paresthesias were elicited. After completion of the procedure, no acute issues were identified and patient continued to be monitored by RN.

## 2023-09-03 NOTE — Anesthesia Procedure Notes (Signed)
Date/Time: 09/03/2023 7:34 AM  Performed by: Shary Decamp, CRNAPre-anesthesia Checklist: Patient identified, Emergency Drugs available, Suction available, Timeout performed and Patient being monitored Patient Re-evaluated:Patient Re-evaluated prior to induction Oxygen Delivery Method: Simple face mask

## 2023-09-03 NOTE — Op Note (Signed)
Alexis Fox, Alexis Fox MEDICAL RECORD NO: [id number] ACCOUNT NO: [id number] DATE OF BIRTH: May 05, 1956 FACILITY: [facility] LOCATION: [facility] PHYSICIAN: Graylin Shiver. August Saucer, MD  Operative Report   DATE OF PROCEDURE: 09/03/2023  PREOPERATIVE DIAGNOSES:  Right knee arthritis.  POSTOPERATIVE DIAGNOSES:  Right knee arthritis.  PROCEDURE:  Right total knee replacement using Persona press-fit components cruciate retaining size 7 narrow femur, size E press-fit tibia, 11 mm medial congruent polyethylene liner and 32 mm press-fit 3-peg patella.  SURGEON:  Graylin Shiver. August Saucer, MD  ASSISTANT:  Karenann Cai, PA.  INDICATIONS:  This is a 67 year old patient with severe right knee pain refractory to nonoperative management.  She also had arthritis by plain radiographs and presents for operative management after explanation of risks and benefits.  DESCRIPTION OF PROCEDURE:  The patient was brought to the operating room where spinal anesthetic was induced.  Preoperative antibiotics administered.  Timeout was called.  Right leg was prescrubbed with alcohol and Betadine, allowed to air dry, prepped  with DuraPrep solution and draped in sterile manner.  Ioban used to cover the operative field.  Leg was elevated and exsanguinated with the Esmarch wrap.  Timeout was called.  Anterior approach to the knee was made.  IrriSept solution utilized.  Median  parapatellar approach was made and marked with #1 Vicryl suture, IrriSept solution again utilized.  Lateral patellofemoral ligament released.  Fat pad partially excised.  There was some synovitis present, which was removed from the capsule and superior  patellar pouch region.  Soft tissue from the anterior femur was removed.  Anterior horn lateral meniscus resected.  ACL released.  Severe tricompartmental arthritis was present, worse in the medial and patellofemoral compartments.  Lateral compartment  had mild arthritis.  Patella was everted, knee was flexed.  Osteophytes were  removed.  This included on the medial tibial plateau as well.  Intramedullary alignment was then used to make a cut perpendicular to the mechanical axis with the collaterals and  posterior neurovascular structures protected.  Cut was 4 mm below the most affected medial tibial plateau in order to get below the sclerotic bone.  This gave a measured, cut of 2 mm on the medial tibial plateau and 10 mm on the lateral tibial plateau.   Intramedullary alignment was then used to cut the femur at 9 mm and 5 degrees of valgus.  This was done because the patient had slight hyperextension preoperatively as well as good range of motion and some joint laxity medially and laterally.  The  anterior, posterior and chamfer cuts were then made in 3 degrees of external rotation, which gave symmetric flexion and extension gap.  A 10 mm spacer gave slight hyperextension and extension, but good stability in flexion.  Bone quality was excellent.   Patella was then cut down from 23 to 13 mm and a 3-peg patellar trial was placed along with tibial plateau, which was aligned with the medial third of the tibial tubercle.  With these trial components in position including the femur we placed a 10 and 11  mm spacer.  An 11 mm spacer gave full extension with full flexion and excellent patellar tracking using no thumbs technique as well as very good stability to varus and valgus stress at 0, 30 and 90 degrees, Final preparations made on both the femur and  the tibia.  Next, trial components were removed.  Three liters of pulsatile irrigation utilized.  Next, we anesthetized the capsule using a combination of Marcaine, Exparel and saline.  Then,  a TXA sponge was allowed to sit with IrriSept solution for 3  minutes in the knee.  These were removed, and vancomycin powder was placed into the tibia and then the tibia was tapped into position with good press fit obtained.  Femur was also tapped into position with excellent press fit obtained.  An  11 mm trial  was placed, which gave full extension as well as very good flexion and good stability to varus and valgus stress.  Patella was also then press fit into position with excellent press fit obtained.  With true components and the same stability and range of  motion and patellar tracking parameters were maintained.  Tourniquet released at this time.  Pouring irrigation utilized.  Bleeding points encountered controlled using electrocautery. 3 liters of pouring irrigation utilized and then we closed the  arthrotomy with the knee over bolster using #1 Vicryl suture.  Prior to final arthrotomy closure, we irrigated the knee out again with IrriSept solution placed and vancomycin powder and then closed the arthrotomy completely with #1 Vicryl suture.   Solution of Marcaine, morphine, clonidine injected into the knee for postoperative pain relief Next IrriSept solution used on the superior aspect of the arthrotomy followed by vancomycin powder and then this layer was closed using 0 Vicryl suture  followed by interrupted inverted 2-0 Vicryl suture and 3-0 Monocryl with Steri-Strips and Impervious Aquacel dressing placed along with Ace wrap and knee immobilizer.  Luke's assistance was required at all times for retraction, opening, closing,  mobilization of tissue.  His assistance was a medical necessity.   PUS D: 09/03/2023 12:49:29 pm T: 09/03/2023 1:21:00 pm  JOB: [id number]/ [id number]

## 2023-09-03 NOTE — Anesthesia Procedure Notes (Signed)
Spinal  Start time: 09/03/2023 7:34 AM End time: 09/03/2023 7:37 AM Reason for block: surgical anesthesia Staffing Performed: anesthesiologist  Anesthesiologist: Shelton Silvas, MD Performed by: Shelton Silvas, MD Authorized by: Shelton Silvas, MD   Preanesthetic Checklist Completed: patient identified, IV checked, site marked, risks and benefits discussed, surgical consent, monitors and equipment checked, pre-op evaluation and timeout performed Spinal Block Patient position: sitting Prep: DuraPrep and site prepped and draped Location: L2-3 Injection technique: single-shot Needle Needle type: Pencan  Needle gauge: 24 G Needle length: 10 cm Needle insertion depth: 10 cm Additional Notes Patient tolerated well. No immediate complications.  Functioning IV was confirmed and monitors were applied. Sterile prep and drape, including hand hygiene and sterile gloves were used. The patient was positioned and the back was prepped. The skin was anesthetized with lidocaine. Free flow of clear CSF was obtained prior to injecting local anesthetic into the CSF. The spinal needle aspirated freely following injection. The needle was carefully withdrawn. The patient tolerated the procedure well.

## 2023-09-03 NOTE — Progress Notes (Signed)
Witness pt on side of bed with walker in hand when asked where she was going pt replied I'm going to the bath room. Witness suggested that it would be best if we wait until PT evaluation. Pt  continue to insist that she was going to use the restroom at that time witness assisted so transition would be safe. Pt walked with walker and was tolerating pain well/ gait was steady. Pt began to turn around in the restroom to sit on the toilet her knee buckled and witness assisted her to the floor were she able to rest her body against witness for comfort. Witness called for assistance and as both where getting V/S and completing assessment Pt stated that she was sorry for insisting to walk to the restroom. Pt was asked if she was ok she stated that she wasn't hurt and that she still needed to urinate and needed to get something to eat. Both witness and assistant witness was able to get her up with the [facility] lift to bed, where pt clothing were changed and she was place on bed pan and able to urinate. Dean,Gregory Scotts office was contacted no new orders were given at this time.

## 2023-09-03 NOTE — Progress Notes (Signed)
Orthopedic Tech Progress Note Patient Details:  Alexis Fox [facility] 1956/08/04 [id number]   CPM Right Knee CPM Right Knee: On Right Knee Flexion (Degrees): 40 Right Knee Extension (Degrees): 10  Post Interventions Patient Tolerated: Well Instructions Provided: Care of device, Adjustment of device  [name] 09/03/2023, 12:14 PM

## 2023-09-04 ENCOUNTER — Encounter (HOSPITAL_COMMUNITY): Payer: Self-pay | Admitting: Orthopedic Surgery

## 2023-09-04 DIAGNOSIS — Z923 Personal history of irradiation: Secondary | ICD-10-CM | POA: Diagnosis not present

## 2023-09-04 DIAGNOSIS — R5383 Other fatigue: Secondary | ICD-10-CM | POA: Diagnosis present

## 2023-09-04 DIAGNOSIS — Z9221 Personal history of antineoplastic chemotherapy: Secondary | ICD-10-CM | POA: Diagnosis not present

## 2023-09-04 DIAGNOSIS — G8929 Other chronic pain: Secondary | ICD-10-CM | POA: Diagnosis present

## 2023-09-04 DIAGNOSIS — G2581 Restless legs syndrome: Secondary | ICD-10-CM | POA: Diagnosis present

## 2023-09-04 DIAGNOSIS — M65961 Unspecified synovitis and tenosynovitis, right lower leg: Secondary | ICD-10-CM | POA: Diagnosis present

## 2023-09-04 DIAGNOSIS — Z803 Family history of malignant neoplasm of breast: Secondary | ICD-10-CM | POA: Diagnosis not present

## 2023-09-04 DIAGNOSIS — Z171 Estrogen receptor negative status [ER-]: Secondary | ICD-10-CM | POA: Diagnosis not present

## 2023-09-04 DIAGNOSIS — I959 Hypotension, unspecified: Secondary | ICD-10-CM | POA: Diagnosis present

## 2023-09-04 DIAGNOSIS — Z807 Family history of other malignant neoplasms of lymphoid, hematopoietic and related tissues: Secondary | ICD-10-CM | POA: Diagnosis not present

## 2023-09-04 DIAGNOSIS — M1711 Unilateral primary osteoarthritis, right knee: Secondary | ICD-10-CM | POA: Diagnosis present

## 2023-09-04 MED ORDER — OXYCODONE HCL 5 MG PO TABS
5.0000 mg | ORAL_TABLET | ORAL | Status: DC | PRN
  Administered 2023-09-04 – 2023-09-05 (×7): 5 mg via ORAL
  Filled 2023-09-04 (×8): qty 1

## 2023-09-04 MED ORDER — SODIUM CHLORIDE 0.9 % IV BOLUS
500.0000 mL | Freq: Once | INTRAVENOUS | Status: AC
  Administered 2023-09-04: 500 mL via INTRAVENOUS

## 2023-09-04 NOTE — Evaluation (Signed)
Physical Therapy Evaluation Patient Details Name: Alexis Fox MRN: [id number] DOB: [birth year] Today's Date: 09/04/2023  History of Present Illness  Pt is 67 year old presented to [facility] on  09/03/23 for rt THR. PMH - breast CA, ACDF, lumbar fusion  Clinical Impression  Pt admitted with above diagnosis. Lives at home with her spouse, in a single-level home with a ramp and one step to enter; Prior to admission, pt was able to manage independently, albeit with OA pain; Presents to PT with a functional decline compared to her baseline, decr activity tolerance, decr R knee stance stability, and R knee pain effecting mobility and ADLs;  Needed min assist to come to sit EOB, light mod assist to power up to stand, and steadying assist during pivot steps bed to recliner; R knee with slight instability in stance, opted to use the KI this session; BPs on the soft side, pt cues to self-monitor for activity tolerance; Pt currently with functional limitations due to the deficits listed below (see PT Problem List). Pt will benefit from skilled PT to increase their independence and safety with mobility to allow discharge to the venue listed below.           If plan is discharge home, recommend the following: A little help with walking and/or transfers;A little help with bathing/dressing/bathroom;Assistance with cooking/housework   Can travel by private Tax inspector (2 wheels);BSC/3in1  Recommendations for Other Services       Functional Status Assessment Patient has had a recent decline in their functional status and demonstrates the ability to make significant improvements in function in a reasonable and predictable amount of time.     Precautions / Restrictions Precautions Precautions: Knee;Fall Precaution Booklet Issued: Yes (comment) Required Braces or Orthoses: Knee Immobilizer - Right Knee Immobilizer - Right: Other (comment) (Until R knee more  stable in stance) Restrictions Weight Bearing Restrictions: No RLE Weight Bearing: Weight bearing as tolerated   Pt educated to not allow any pillow or bolster under knee for healing with optimal range of motion.      Mobility  Bed Mobility Overal bed mobility: Needs Assistance Bed Mobility: Supine to Sit     Supine to sit: Min assist     General bed mobility comments: min assist for RLE    Transfers Overall transfer level: Needs assistance Equipment used: Rolling walker (2 wheels) Transfers: Sit to/from Stand, Bed to chair/wheelchair/BSC Sit to Stand: Mod assist   Step pivot transfers: Min assist       General transfer comment: light mod assist to stand from bed; min assist to steady with pivot steps bed to recliner; cues to self-monitor for activity tolerance    Ambulation/Gait               General Gait Details: Deferred due to lightheadedness, feeling "fuzzy"  Stairs            Wheelchair Mobility     Tilt Bed    Modified Rankin (Stroke Patients Only)       Balance                                             Pertinent Vitals/Pain Pain Assessment Pain Assessment: 0-10 Pain Score: 4  Pain Location: R knee Pain Descriptors / Indicators: Grimacing, Guarding Pain Intervention(s): Monitored during session  Home Living Family/patient expects to be discharged to:: Private residence Living Arrangements: Spouse/significant other Available Help at Discharge: Family Type of Home: House Home Access: Ramped entrance;Stairs to enter Entrance Stairs-Rails: None Entrance Stairs-Number of Steps: 1   Home Layout: One level Home Equipment: Agricultural consultant (2 wheels);Rollator (4 wheels);Cane - single point;Grab bars - tub/shower;Grab bars - toilet Additional Comments: adjustable bed    Prior Function Prior Level of Function : Independent/Modified Independent                     Extremity/Trunk Assessment   Upper  Extremity Assessment Upper Extremity Assessment: Overall WFL for tasks assessed    Lower Extremity Assessment Lower Extremity Assessment: RLE deficits/detail RLE Deficits / Details: Grossly decr AROM and strength, limited by pain postop; quad contraction present; noted knee with small buckles in standing, used KI       Communication   Communication Communication: No apparent difficulties  Cognition Arousal: Alert (a bit sleepy due to meds) Behavior During Therapy: WFL for tasks assessed/performed Overall Cognitive Status: Within Functional Limits for tasks assessed                                          General Comments General comments (skin integrity, edema, etc.): usband present and supportive    Exercises Total Joint Exercises Quad Sets: AROM, Right, 5 reps Heel Slides: AAROM, Right, 5 reps Goniometric ROM: approx 0-40 deg   Assessment/Plan    PT Assessment Patient needs continued PT services  PT Problem List Decreased strength;Decreased range of motion;Decreased activity tolerance;Decreased balance;Decreased mobility;Decreased knowledge of use of DME;Decreased safety awareness;Decreased knowledge of precautions;Cardiopulmonary status limiting activity;Pain       PT Treatment Interventions DME instruction;Gait training;Stair training;Functional mobility training;Therapeutic activities;Therapeutic exercise;Balance training;Patient/family education    PT Goals (Current goals can be found in the Care Plan section)  Acute Rehab PT Goals Patient Stated Goal: less pain in R knee PT Goal Formulation: With patient Time For Goal Achievement: 09/18/23 Potential to Achieve Goals: Good    Frequency 7X/week     Co-evaluation               AM-PAC PT "6 Clicks" Mobility  Outcome Measure Help needed turning from your back to your side while in a flat bed without using bedrails?: A Little Help needed moving from lying on your back to sitting on the  side of a flat bed without using bedrails?: A Little Help needed moving to and from a bed to a chair (including a wheelchair)?: A Little Help needed standing up from a chair using your arms (e.g., wheelchair or bedside chair)?: A Lot Help needed to walk in hospital room?: A Little Help needed climbing 3-5 steps with a railing? : A Little 6 Click Score: 17    End of Session Equipment Utilized During Treatment: Right knee immobilizer;Gait belt Activity Tolerance: Patient tolerated treatment well;Other (comment) (though dizzy with standing, getting bolus) Patient left: in chair;with call bell/phone within reach;with family/visitor present;with chair alarm set Nurse Communication: Mobility status PT Visit Diagnosis: Unsteadiness on feet (R26.81);Pain Pain - Right/Left: Right Pain - part of body: Knee    Time: 1001-1043 PT Time Calculation (min) (ACUTE ONLY): 42 min   Charges:   PT Evaluation $PT Eval Moderate Complexity: 1 Mod PT Treatments $Therapeutic Activity: 23-37 mins PT General Charges $$ ACUTE PT VISIT: 1 Visit  Van Clines, PT  Acute Rehabilitation Services Office [phone] Secure Chat welcomed   Levi Aland 09/04/2023, 3:22 PM

## 2023-09-04 NOTE — Progress Notes (Signed)
  Subjective: Alexis Fox is a 67 y.o. female s/p right TKA.  They are POD 1.  Pt's pain is moderate but controlled.  Pt denies any complain of chest pain, shortness of breath, abdominal pain, calf pain.  Patient denies any fevers or chills.  Taking 10 mg oxycodone.  Has not work with physical therapy yet.  Did have incident yesterday where she had a fall and did not land on her right knee.  She states that after the fall she did have some increased generalized pain throughout the majority of her right side of her body but this has returned to baseline as of this morning.  She has no complaints aside from postop knee pain currently.  Objective: Vital signs in last 24 hours: Temp:  [97.2 F (36.2 C)-98.9 F (37.2 C)] 98.9 F (37.2 C) (10/02 0803) Pulse Rate:  [53-80] 68 (10/02 0803) Resp:  [10-20] 18 (10/02 0803) BP: (74-106)/(46-78) 93/78 (10/02 0803) SpO2:  [90 %-99 %] 98 % (10/02 0803)  Intake/Output from previous day: 10/01 0701 - 10/02 0700 In: 1750 [I.V.:1300; IV Piggyback:450] Out: 500 [Urine:400; Blood:100] Intake/Output this shift: No intake/output data recorded.  Exam:  No gross blood or drainage overlying the dressing 2+ DP pulse Sensation intact distally in the operative foot Able to dorsiflex and plantarflex the operative foot No calf tenderness.  Negative Homans' sign. Able to perform straight leg raise   Labs: No results for input(s): "HGB" in the last 72 hours. No results for input(s): "WBC", "RBC", "HCT", "PLT" in the last 72 hours. No results for input(s): "NA", "K", "CL", "CO2", "BUN", "CREATININE", "GLUCOSE", "CALCIUM" in the last 72 hours. No results for input(s): "LABPT", "INR" in the last 72 hours.  Assessment/Plan: Pt is POD 1 s/p TKA.    -Plan to discharge to home today or tomorrow pending patient's pain and PT eval  -Patient has 1 step to get into her house.  She is having some hypotension and fatigue.  Plan to order saline bolus and we will  see how she does with physical therapy today.  -WBAT with a walker  -Follow-up with Dr. August Saucer in clinic 2 weeks postoperatively    [facility] 09/04/2023, 8:17 AM

## 2023-09-04 NOTE — Progress Notes (Signed)
Physical Therapy Treatment Patient Details Name: Alexis Fox MRN: [id number] DOB: 1956-04-20 Today's Date: 09/04/2023   History of Present Illness Pt is 67 year old presented to [facility] on  09/03/23 for rt THR. PMH - breast CA, ACDF, lumbar fusion    PT Comments  Continuing work on functional mobility and activity tolerance;  pt received in recliner; got BPs in sittingand standing, which were on the low side, and while there was not a glaring SBP drop between sitting and standing, it's worth noting that her MAP did drop from 91 sitting to 65 standing; pt did mention that in previous surgeries, her BP postop stayed soft for a day or two; her R knee showed much better control and standing stability than am session, and I anticipate not needing the [facility] tomorrow; Hopeful to be able to walk an appreciable distance next session    09/04/23 1700  Vital Signs  Patient Position (if appropriate) Orthostatic Vitals  Orthostatic Sitting  BP- Sitting 110/85 (MAP 91)  Pulse- Sitting 70  Orthostatic Standing at 0 minutes  BP- Standing at 0 minutes 100/51 (MAP 65)  Pulse- Standing at 0 minutes 74 (Feeling "fuzzy")      If plan is discharge home, recommend the following: A little help with walking and/or transfers;A little help with bathing/dressing/bathroom;Assistance with cooking/housework   Can travel by private Automotive engineer (2 wheels);BSC/3in1    Recommendations for Other Services       Precautions / Restrictions Precautions Precautions: Knee;Fall Precaution Booklet Issued: Yes (comment) Required Braces or Orthoses: Knee Immobilizer - Right Knee Immobilizer - Right: On when out of bed or walking (until R knee more stable in stance) Restrictions Weight Bearing Restrictions: No RLE Weight Bearing: Weight bearing as tolerated   Pt educated to not allow any pillow or bolster under knee for healing with optimal range of motion.      Mobility  Bed Mobility Overal bed mobility: Needs Assistance Bed Mobility: Sit to Supine     Supine to sit: Min assist Sit to supine: Supervision   General bed mobility comments: able to lift her RLE into bed without physical assist    Transfers Overall transfer level: Needs assistance Equipment used: Rolling walker (2 wheels) Transfers: Sit to/from Stand, Bed to chair/wheelchair/BSC Sit to Stand: Mod assist   Step pivot transfers: Contact guard assist       General transfer comment: light mod assist to stand from recliner; min assist to steady with pivot steps bed to recliner; R knee notably more stable in stance; cues to self-monitor for activity tolerance    Ambulation/Gait               General Gait Details: Deferred due to lightheadedness, feeling "fuzzy"; see vitals flowsheets for BPs   Stairs             Wheelchair Mobility     Tilt Bed    Modified Rankin (Stroke Patients Only)       Balance                                            Cognition Arousal: Alert Behavior During Therapy: WFL for tasks assessed/performed Overall Cognitive Status: Within Functional Limits for tasks assessed  Exercises Total Joint Exercises Quad Sets: AROM, Right, 10 reps Short Arc Quad: AROM, Right, 10 reps Heel Slides: AAROM, Right, 5 reps Straight Leg Raises: AROM, Right, 10 reps Knee Flexion: Right, AAROM, 5 reps, Seated Goniometric ROM: approx 0-59degrees    General Comments General comments (skin integrity, edema, etc.): usband present and supportive      Pertinent Vitals/Pain Pain Assessment Pain Assessment: Faces Pain Score: 4  Faces Pain Scale: Hurts little more Pain Location: R knee with knee flexion work Pain Descriptors / Indicators: Grimacing, Guarding Pain Intervention(s): Monitored during session    Home Living Family/patient expects to be discharged to::  Private residence Living Arrangements: Spouse/significant other Available Help at Discharge: Family Type of Home: House Home Access: Ramped entrance;Stairs to enter Entrance Stairs-Rails: None Entrance Stairs-Number of Steps: 1   Home Layout: One level Home Equipment: Agricultural consultant (2 wheels);Rollator (4 wheels);Cane - single point;Grab bars - tub/shower;Grab bars - toilet Additional Comments: adjustable bed    Prior Function            PT Goals (current goals can now be found in the care plan section) Acute Rehab PT Goals Patient Stated Goal: less pain in R knee PT Goal Formulation: With patient Time For Goal Achievement: 09/18/23 Potential to Achieve Goals: Good Progress towards PT goals: Progressing toward goals    Frequency    7X/week      PT Plan      Co-evaluation              AM-PAC PT "6 Clicks" Mobility   Outcome Measure  Help needed turning from your back to your side while in a flat bed without using bedrails?: None Help needed moving from lying on your back to sitting on the side of a flat bed without using bedrails?: None Help needed moving to and from a bed to a chair (including a wheelchair)?: A Little Help needed standing up from a chair using your arms (e.g., wheelchair or bedside chair)?: A Little Help needed to walk in hospital room?: A Lot Help needed climbing 3-5 steps with a railing? : A Little 6 Click Score: 19    End of Session Equipment Utilized During Treatment: Right knee immobilizer;Gait belt Activity Tolerance: Patient tolerated treatment well;Other (comment) (though still feeling slightly fuzzy) Patient left: in bed;with call bell/phone within reach;with bed alarm set Nurse Communication: Mobility status (and O2 sats during session) PT Visit Diagnosis: Unsteadiness on feet (R26.81);Pain Pain - Right/Left: Right Pain - part of body: Knee     Time: 1320-1400 PT Time Calculation (min) (ACUTE ONLY): 40 min  Charges:     $Therapeutic Exercise: 8-22 mins $Therapeutic Activity: 23-37 mins PT General Charges $$ ACUTE PT VISIT: 1 Visit                     Van Clines, PT  Acute Rehabilitation Services Office [phone] Secure Chat welcomed    Levi Aland 09/04/2023, 5:40 PM

## 2023-09-04 NOTE — Care Management Obs Status (Signed)
MEDICARE OBSERVATION STATUS NOTIFICATION   Patient Details  Name: Alexis Fox MRN: [id number] Date of Birth: 26-May-1956   Medicare Observation Status Notification Given:  Yes    Lawerance Sabal, RN 09/04/2023, 7:56 AM

## 2023-09-04 NOTE — Care Plan (Signed)
Ortho Bundle Case Management Note  Patient Details  Name: Alexis Fox MRN: [id number] Date of Birth: 1956-04-26  [facility] RNCM call to patient prior to surgery to discuss her upcoming Right total knee by Dr. August Saucer. She is an Ortho bundle patient and agreeable to case management. She lives with her spouse and her plan is to return home after discharge with his assistance. She has a CPM, RW- delivered by [employer] prior to surgery. No other DME needs.Anticipate HHPT will be needed after a short hospital stay. Referral made to [facility] after choice provided. She also requested [facility] for her OPPT, which has already been ordered and scheduled. Reviewed all post op care instructions. Will continue to follow for needs.                   DME Arranged:  CPM, Walker rolling DME Agency:  [employer]  HH Arranged:  PT HH Agency:  [facility]  Additional Comments: Please contact me with any questions of if this plan should need to change.  Ralph Dowdy, RN, BSN, [employer]  [phone] 09/04/2023, 8:42 AM

## 2023-09-04 NOTE — Plan of Care (Signed)

## 2023-09-04 NOTE — Progress Notes (Signed)
Physical Therapy Note  SATURATION QUALIFICATIONS: (This note is used to comply with regulatory documentation for home oxygen)  Patient Saturations on Room Air at Rest = 90%  Patient Saturations on Room Air while Ambulating = 84%  Patient Saturations on 2 Liters of oxygen while Ambulating = 93% (very short, in room distnace)  Please briefly explain why patient needs home oxygen:  Patient requires supplemental oxygen to maintain oxygen saturations at acceptable, safe levels with physical activity.  (See other PT notes of this date for other details)  Van Clines, PT  Acute Rehabilitation Services Office [phone] Secure Chat welcomed

## 2023-09-04 NOTE — Telephone Encounter (Signed)
I saw her this morning.  She did have a fall.  Did not fall on her knee.  Incision looks good with no bleeding in the Aquacel dressing.  No need for radiographs at this time.

## 2023-09-05 MED ORDER — CELECOXIB 100 MG PO CAPS: 100.0000 mg | ORAL_CAPSULE | Freq: Two times a day (BID) | ORAL | 0 refills | Status: DC

## 2023-09-05 MED ORDER — METHOCARBAMOL 500 MG PO TABS: 500.0000 mg | ORAL_TABLET | Freq: Three times a day (TID) | ORAL | 0 refills | Status: DC | PRN

## 2023-09-05 MED ORDER — ASPIRIN 81 MG PO CHEW: 81.0000 mg | CHEWABLE_TABLET | Freq: Two times a day (BID) | ORAL | 0 refills | Status: DC

## 2023-09-05 MED ORDER — OXYCODONE HCL 5 MG PO TABS: 5.0000 mg | ORAL_TABLET | ORAL | 0 refills | Status: DC | PRN

## 2023-09-05 NOTE — Progress Notes (Signed)
Pt discharged to home with husband.  Ice bucket and pt belongings given to the pt.  Discharge:  Pt d/c from room via wheelchair, Family member with the pt.  Discharge instructions given to the patient and family members.  No questions from pt,   Pt dressed in street clothes and left with discharge papers and prescriptions  in hand.  IV d/ced, tele removed and no complaints of pain or discomfort.

## 2023-09-05 NOTE — Plan of Care (Signed)

## 2023-09-05 NOTE — Progress Notes (Signed)
Physical Therapy Treatment Patient Details Name: Alexis Fox MRN: [id number] DOB: 03-Aug-1956 Today's Date: 09/05/2023   History of Present Illness Pt is 67 year old presented to [facility] on  09/03/23 for R THR. PMH - breast CA, ACDF C2-3, lumbar fusion L2-S1.    PT Comments  The pt was agreeable to session with focus on progressing gait which she tolerated well. She continues to demo good quad activation with supine quad sets, but requested use of KI for gait progression for safety. The pt did not have any episodes of knee buckling with gait, tolerated ~50 ft + ~ 60 ft after seated rest break. Discussed continued safe mobility progression with pt to continue at home, plan to return this afternoon for additional stair training and final education prior to anticipated d/c home later today. Pt in agreement with plan.    If plan is discharge home, recommend the following: A little help with walking and/or transfers;A little help with bathing/dressing/bathroom;Assistance with cooking/housework   Can travel by private Automotive engineer (2 wheels);BSC/3in1    Recommendations for Other Services       Precautions / Restrictions Precautions Precautions: Knee;Fall Precaution Booklet Issued: Yes (comment) Required Braces or Orthoses: Knee Immobilizer - Right Knee Immobilizer - Right: On when out of bed or walking (until R knee more stable in stance) Restrictions Weight Bearing Restrictions: Yes RLE Weight Bearing: Weight bearing as tolerated     Mobility  Bed Mobility Overal bed mobility: Needs Assistance Bed Mobility: Sit to Supine     Supine to sit: Supervision     General bed mobility comments: supervision with pt using UE to move RLE to EOB    Transfers Overall transfer level: Needs assistance Equipment used: Rolling walker (2 wheels) Transfers: Sit to/from Stand Sit to Stand: Min assist, Contact guard assist           General  transfer comment: minA to CGA to stand from EOB and recliner. pt using armrests well and little wt on RLE    Ambulation/Gait Ambulation/Gait assistance: Contact guard assist Gait Distance (Feet): 50 Feet (+ 60 ft) Assistive device: Rolling walker (2 wheels) Gait Pattern/deviations: Step-to pattern, Decreased step length - left, Decreased stance time - right, Decreased weight shift to right Gait velocity: decreased Gait velocity interpretation: <1.31 ft/sec, indicative of household ambulator   General Gait Details: pt with antalgic gait and decreased wt acceptance on RLE, step to to some step-through. heavy dependence on RW. pt with some dizziness that passes with static standing   Stairs             Wheelchair Mobility     Tilt Bed    Modified Rankin (Stroke Patients Only)       Balance Overall balance assessment: Needs assistance Sitting-balance support: No upper extremity supported Sitting balance-Leahy Scale: Good     Standing balance support: Bilateral upper extremity supported, During functional activity Standing balance-Leahy Scale: Poor Standing balance comment: poor tolerance for challenge. dependent on BUE support                            Cognition Arousal: Alert Behavior During Therapy: WFL for tasks assessed/performed Overall Cognitive Status: Within Functional Limits for tasks assessed  Exercises Total Joint Exercises Quad Sets: AROM, Right, 10 reps    General Comments General comments (skin integrity, edema, etc.): pt spouse present and supportive, ice machine refilled      Pertinent Vitals/Pain Pain Assessment Pain Assessment: Faces Pain Score: 7  Faces Pain Scale: Hurts even more Pain Location: less at rest, 5-6 in CPM, increased with gait Pain Descriptors / Indicators: Grimacing, Guarding Pain Intervention(s): Limited activity within patient's tolerance, Monitored  during session, Premedicated before session, Repositioned, Ice applied     PT Goals (current goals can now be found in the care plan section) Acute Rehab PT Goals Patient Stated Goal: less pain in R knee PT Goal Formulation: With patient Time For Goal Achievement: 09/18/23 Potential to Achieve Goals: Good Progress towards PT goals: Progressing toward goals    Frequency    7X/week       AM-PAC PT "6 Clicks" Mobility   Outcome Measure  Help needed turning from your back to your side while in a flat bed without using bedrails?: None Help needed moving from lying on your back to sitting on the side of a flat bed without using bedrails?: None Help needed moving to and from a bed to a chair (including a wheelchair)?: A Little Help needed standing up from a chair using your arms (e.g., wheelchair or bedside chair)?: A Little Help needed to walk in hospital room?: A Little Help needed climbing 3-5 steps with a railing? : A Little 6 Click Score: 20    End of Session Equipment Utilized During Treatment: Right knee immobilizer;Gait belt Activity Tolerance: Patient tolerated treatment well;Other (comment) (though still feeling slightly fuzzy) Patient left: with call bell/phone within reach;in chair;with family/visitor present Nurse Communication: Mobility status PT Visit Diagnosis: Unsteadiness on feet (R26.81);Pain Pain - Right/Left: Right Pain - part of body: Knee     Time: 0914-1004 PT Time Calculation (min) (ACUTE ONLY): 50 min  Charges:    $Gait Training: 23-37 mins $Therapeutic Exercise: 8-22 mins PT General Charges $$ ACUTE PT VISIT: 1 Visit                     Vickki Muff, PT, DPT   Acute Rehabilitation Department Office [phone] Secure Chat Communication Preferred   Ronnie Derby 09/05/2023, 10:14 AM

## 2023-09-05 NOTE — Progress Notes (Signed)
  Subjective: Alexis Fox is a 67 y.o. female s/p right TKA.  They are POD 2.  Pt's pain is controlled and improved compared with yesterday.  Pt denies any complain of chest pain, shortness of breath, abdominal pain, calf pain.  Patient denies any fevers or chills.  Ambulated well with physical therapy today walking 110 feet with short break.  Objective: Vital signs in last 24 hours: Temp:  [97.8 F (36.6 C)-98.5 F (36.9 C)] 98.5 F (36.9 C) (10/03 0705) Pulse Rate:  [61-71] 70 (10/03 0705) Resp:  [16-18] 18 (10/03 0705) BP: (89-128)/(48-63) 128/58 (10/03 0705) SpO2:  [97 %-99 %] 99 % (10/03 0705)  Intake/Output from previous day: 10/02 0701 - 10/03 0700 In: 480 [P.O.:480] Out: -  Intake/Output this shift: No intake/output data recorded.  Exam:  No gross blood or drainage overlying the dressing 2+ DP pulse Sensation intact distally in the operative foot Able to dorsiflex and plantarflex the operative foot No calf tenderness.  Negative Homans' sign. Able to perform straight leg raise   Labs: No results for input(s): "HGB" in the last 72 hours. No results for input(s): "WBC", "RBC", "HCT", "PLT" in the last 72 hours. No results for input(s): "NA", "K", "CL", "CO2", "BUN", "CREATININE", "GLUCOSE", "CALCIUM" in the last 72 hours. No results for input(s): "LABPT", "INR" in the last 72 hours.  Assessment/Plan: Pt is POD 2 s/p TKA.    -Plan to discharge to home today; okay to discharge after afternoon physical therapy session  -WBAT with a walker  -Follow-up with Dr. August Saucer in clinic 2 weeks postoperatively    [facility] 09/05/2023, 11:27 AM

## 2023-09-05 NOTE — Progress Notes (Signed)
Physical Therapy Treatment Patient Details Name: Alexis Fox MRN: [id number] DOB: [birth year] Today's Date: 09/05/2023   History of Present Illness Pt is 67 year old presented to [facility] on  09/03/23 for R THR. PMH - breast CA, ACDF C2-3, lumbar fusion L2-S1.    PT Comments  Pt seen for additional session with focus on stair training. Pt able to complete with CGA and use of RW with backwards technique safely and without KI. Pt reports good confidence in technique, spouse will be available as needed. Pt then able to demo great progress with ambulation in hallway. Still benefits from seated rest, but able to progress distance without need for chair follow. Final education complete, pt safe for d/c home when medically stable.     If plan is discharge home, recommend the following: A little help with walking and/or transfers;A little help with bathing/dressing/bathroom;Assistance with cooking/housework   Can travel by private Automotive engineer (2 wheels);BSC/3in1    Recommendations for Other Services       Precautions / Restrictions Precautions Precautions: Knee;Fall Precaution Booklet Issued: Yes (comment) Required Braces or Orthoses: Knee Immobilizer - Right Knee Immobilizer - Right: On when out of bed or walking (until R knee more stable in stance) Restrictions Weight Bearing Restrictions: Yes RLE Weight Bearing: Weight bearing as tolerated     Mobility  Bed Mobility Overal bed mobility: Needs Assistance Bed Mobility: Sit to Supine     Supine to sit: Supervision     General bed mobility comments: pt OOB in recliner at start and end of session    Transfers Overall transfer level: Needs assistance Equipment used: Rolling walker (2 wheels) Transfers: Sit to/from Stand Sit to Stand: Min assist, Contact guard assist           General transfer comment: minA to CGA to stand from EOB and recliner. pt using armrests well and  little wt on RLE    Ambulation/Gait Ambulation/Gait assistance: Contact guard assist Gait Distance (Feet): 60 Feet (+ 60 ft) Assistive device: Rolling walker (2 wheels) Gait Pattern/deviations: Step-to pattern, Decreased step length - left, Decreased stance time - right, Decreased weight shift to right Gait velocity: decreased Gait velocity interpretation: <1.31 ft/sec, indicative of household ambulator   General Gait Details: pt with antalgic gait and decreased wt acceptance on RLE, step to to some step-through. heavy dependence on RW. pt with some dizziness that passes with static standing   Stairs Stairs: Yes Stairs assistance: Contact guard assist Stair Management: No rails, Step to pattern, Backwards, With walker Number of Stairs: 1     Wheelchair Mobility     Tilt Bed    Modified Rankin (Stroke Patients Only)       Balance Overall balance assessment: Needs assistance Sitting-balance support: No upper extremity supported Sitting balance-Leahy Scale: Good     Standing balance support: Bilateral upper extremity supported, During functional activity Standing balance-Leahy Scale: Poor Standing balance comment: poor tolerance for challenge. dependent on BUE support                            Cognition Arousal: Alert Behavior During Therapy: WFL for tasks assessed/performed Overall Cognitive Status: Within Functional Limits for tasks assessed  Exercises Total Joint Exercises Quad Sets: AROM, Right, 10 reps    General Comments        Pertinent Vitals/Pain Pain Assessment Pain Assessment: Faces Pain Score: 8  Faces Pain Scale: Hurts even more Pain Location: less at rest, 5-6 in CPM, increased with gait Pain Descriptors / Indicators: Grimacing, Guarding Pain Intervention(s): Limited activity within patient's tolerance, Monitored during session, Repositioned, Patient requesting pain meds-RN  notified, Ice applied    Home Living                          Prior Function            PT Goals (current goals can now be found in the care plan section) Acute Rehab PT Goals Patient Stated Goal: less pain in R knee PT Goal Formulation: With patient Time For Goal Achievement: 09/18/23 Potential to Achieve Goals: Good Progress towards PT goals: Progressing toward goals    Frequency    7X/week      PT Plan      Co-evaluation              AM-PAC PT "6 Clicks" Mobility   Outcome Measure  Help needed turning from your back to your side while in a flat bed without using bedrails?: None Help needed moving from lying on your back to sitting on the side of a flat bed without using bedrails?: None Help needed moving to and from a bed to a chair (including a wheelchair)?: A Little Help needed standing up from a chair using your arms (e.g., wheelchair or bedside chair)?: A Little Help needed to walk in hospital room?: A Little Help needed climbing 3-5 steps with a railing? : A Little 6 Click Score: 20    End of Session Equipment Utilized During Treatment: Right knee immobilizer;Gait belt Activity Tolerance: Patient tolerated treatment well;Other (comment) (though still feeling slightly fuzzy) Patient left: with call bell/phone within reach;in chair;with family/visitor present Nurse Communication: Mobility status PT Visit Diagnosis: Unsteadiness on feet (R26.81);Pain Pain - Right/Left: Right Pain - part of body: Knee     Time: 1350-1429 PT Time Calculation (min) (ACUTE ONLY): 39 min  Charges:    $Gait Training: 23-37 mins $Therapeutic Exercise: 8-22 mins PT General Charges $$ ACUTE PT VISIT: 1 Visit                     Vickki Muff, PT, DPT   Acute Rehabilitation Department Office [phone] Secure Chat Communication Preferred   Ronnie Derby 09/05/2023, 4:07 PM

## 2023-09-09 DIAGNOSIS — Z96651 Presence of right artificial knee joint: Secondary | ICD-10-CM | POA: Diagnosis not present

## 2023-09-09 DIAGNOSIS — Z853 Personal history of malignant neoplasm of breast: Secondary | ICD-10-CM | POA: Diagnosis not present

## 2023-09-09 DIAGNOSIS — Z9181 History of falling: Secondary | ICD-10-CM | POA: Diagnosis not present

## 2023-09-09 DIAGNOSIS — G4733 Obstructive sleep apnea (adult) (pediatric): Secondary | ICD-10-CM | POA: Diagnosis not present

## 2023-09-09 DIAGNOSIS — G2581 Restless legs syndrome: Secondary | ICD-10-CM | POA: Diagnosis not present

## 2023-09-09 DIAGNOSIS — F419 Anxiety disorder, unspecified: Secondary | ICD-10-CM | POA: Diagnosis not present

## 2023-09-09 DIAGNOSIS — Z471 Aftercare following joint replacement surgery: Secondary | ICD-10-CM | POA: Diagnosis not present

## 2023-09-11 ENCOUNTER — Other Ambulatory Visit: Payer: Self-pay | Admitting: Surgical

## 2023-09-11 ENCOUNTER — Telehealth: Payer: Self-pay | Admitting: *Deleted

## 2023-09-11 MED ORDER — OXYCODONE HCL 5 MG PO TABS: 5.0000 mg | ORAL_TABLET | Freq: Four times a day (QID) | ORAL | 0 refills | Status: DC | PRN

## 2023-09-11 MED ORDER — METHOCARBAMOL 500 MG PO TABS: 500.0000 mg | ORAL_TABLET | Freq: Three times a day (TID) | ORAL | 0 refills | Status: DC | PRN

## 2023-09-11 NOTE — Telephone Encounter (Signed)
Sent in

## 2023-09-11 NOTE — Telephone Encounter (Signed)
Patient needs refill of pain medication and muscle relaxer. Thank you.

## 2023-09-12 DIAGNOSIS — Z853 Personal history of malignant neoplasm of breast: Secondary | ICD-10-CM | POA: Diagnosis not present

## 2023-09-12 DIAGNOSIS — G4733 Obstructive sleep apnea (adult) (pediatric): Secondary | ICD-10-CM | POA: Diagnosis not present

## 2023-09-12 DIAGNOSIS — Z471 Aftercare following joint replacement surgery: Secondary | ICD-10-CM | POA: Diagnosis not present

## 2023-09-12 DIAGNOSIS — F419 Anxiety disorder, unspecified: Secondary | ICD-10-CM | POA: Diagnosis not present

## 2023-09-12 DIAGNOSIS — Z9181 History of falling: Secondary | ICD-10-CM | POA: Diagnosis not present

## 2023-09-12 DIAGNOSIS — G2581 Restless legs syndrome: Secondary | ICD-10-CM | POA: Diagnosis not present

## 2023-09-12 DIAGNOSIS — Z96651 Presence of right artificial knee joint: Secondary | ICD-10-CM | POA: Diagnosis not present

## 2023-09-12 NOTE — Discharge Summary (Signed)
Physician Discharge Summary      Patient ID: Alexis Fox MRN: [id number] DOB/AGE: 1956-10-05 67 y.o.  Admit date: 09/03/2023 Discharge date: 09/05/2023  Admission Diagnoses:  Principal Problem:   DJD (degenerative joint disease) of knee Active Problems:   S/P total knee arthroplasty, right   Discharge Diagnoses:  Same  Surgeries: Procedure(s): RIGHT TOTAL KNEE ARTHROPLASTY on 09/03/2023   Consultants:   Discharged Condition: Stable  Hospital Course: Alexis Fox is an 67 y.o. female who was admitted 09/03/2023 with a chief complaint of right knee pain, and found to have a diagnosis of right knee osteoarthritis.  They were brought to the operating room on 09/03/2023 and underwent the above named procedures.  Pt awoke from anesthesia without complication and was transferred to the floor. On POD1, patient's pain was overall controlled.  She progressed with her mobilization with physical therapy to the point where she was ready for discharge home on POD 2.  She had no red flag signs or symptoms throughout her stay.  Discharge home and will f/u with Dr. August Saucer for myself in clinic in ~2 weeks.   Antibiotics given:  Anti-infectives (From admission, onward)    Start     Dose/Rate Route Frequency Ordered Stop   09/03/23 1400  ceFAZolin (ANCEF) IVPB 2g/100 mL premix        2 g 200 mL/hr over 30 Minutes Intravenous Every 6 hours 09/03/23 1252 09/03/23 2204   09/03/23 0941  vancomycin (VANCOCIN) powder  Status:  Discontinued          As needed 09/03/23 0942 09/03/23 1038   09/03/23 0600  ceFAZolin (ANCEF) IVPB 2g/100 mL premix        2 g 200 mL/hr over 30 Minutes Intravenous On call to O.R. 09/03/23 0544 09/03/23 0800     .  Recent vital signs:  Vitals:   09/05/23 0705 09/05/23 1319  BP: (!) 128/58 (!) 111/50  Pulse: 70 82  Resp: 18 18  Temp: 98.5 F (36.9 C) 98.7 F (37.1 C)  SpO2: 99% 94%    Recent laboratory studies:  Results for orders placed or  performed during the hospital encounter of 08/21/23  Surgical pcr screen   Specimen: Nasal Mucosa; Nasal Swab  Result Value Ref Range   MRSA, PCR NEGATIVE NEGATIVE   Staphylococcus aureus NEGATIVE NEGATIVE  Basic metabolic panel  Result Value Ref Range   Sodium 134 (L) 135 - 145 mmol/L   Potassium 4.6 3.5 - 5.1 mmol/L   Chloride 103 98 - 111 mmol/L   CO2 24 22 - 32 mmol/L   Glucose, Bld 92 70 - 99 mg/dL   BUN 18 8 - 23 mg/dL   Creatinine, Ser 4.09 0.44 - 1.00 mg/dL   Calcium 8.9 8.9 - 81.1 mg/dL   GFR, Estimated >91 >47 mL/min   Anion gap 7 5 - 15  Urinalysis, w/ Reflex to Culture (Infection Suspected) -Urine, Clean Catch  Result Value Ref Range   Specimen Source URINE, CLEAN CATCH    Color, Urine YELLOW YELLOW   APPearance CLEAR CLEAR   Specific Gravity, Urine 1.018 1.005 - 1.030   pH 5.0 5.0 - 8.0   Glucose, UA NEGATIVE NEGATIVE mg/dL   Hgb urine dipstick NEGATIVE NEGATIVE   Bilirubin Urine NEGATIVE NEGATIVE   Ketones, ur NEGATIVE NEGATIVE mg/dL   Protein, ur NEGATIVE NEGATIVE mg/dL   Nitrite NEGATIVE NEGATIVE   Leukocytes,Ua NEGATIVE NEGATIVE   RBC / HPF 0-5 0 - 5 RBC/hpf   WBC, UA  0-5 0 - 5 WBC/hpf   Bacteria, UA NONE SEEN NONE SEEN   Squamous Epithelial / HPF 0-5 0 - 5 /HPF   Mucus PRESENT   CBC  Result Value Ref Range   WBC 8.0 4.0 - 10.5 K/uL   RBC 4.34 3.87 - 5.11 MIL/uL   Hemoglobin 12.9 12.0 - 15.0 g/dL   HCT 41.3 24.4 - 01.0 %   MCV 94.2 80.0 - 100.0 fL   MCH 29.7 26.0 - 34.0 pg   MCHC 31.5 30.0 - 36.0 g/dL   RDW 27.2 53.6 - 64.4 %   Platelets 254 150 - 400 K/uL   nRBC 0.0 0.0 - 0.2 %    Discharge Medications:   Allergies as of 09/05/2023       Reactions   Turmeric Diarrhea   Severe diarrhea   Decadron [dexamethasone]    Exacerbates restless leg        Medication List     TAKE these medications    acetaminophen 500 MG tablet Commonly known as: TYLENOL Take 1,000 mg by mouth every 8 (eight) hours as needed for moderate pain.   aspirin  81 MG chewable tablet Chew 1 tablet (81 mg total) by mouth 2 (two) times daily.   buPROPion 150 MG 24 hr tablet Commonly known as: WELLBUTRIN XL Take 150 mg by mouth daily.   celecoxib 100 MG capsule Commonly known as: CELEBREX Take 1 capsule (100 mg total) by mouth 2 (two) times daily.   cetirizine 10 MG tablet Commonly known as: ZYRTEC Take 10 mg by mouth at bedtime.   citalopram 40 MG tablet Commonly known as: CELEXA Take 40 mg by mouth at bedtime.   clobetasol ointment 0.05 % Commonly known as: TEMOVATE Apply 1 application topically 2 (two) times daily. What changed:  when to take this reasons to take this   clonazePAM 1 MG tablet Commonly known as: KLONOPIN Take 1 mg by mouth at bedtime.   docusate sodium 100 MG capsule Commonly known as: COLACE Take 100 mg by mouth 2 (two) times daily.   fluticasone 50 MCG/ACT nasal spray Commonly known as: FLONASE Place 1 spray into both nostrils daily as needed for allergies.   gabapentin 300 MG capsule Commonly known as: NEURONTIN Take 300 mg by mouth at bedtime.   Iron 325 (65 Fe) MG Tabs Take 325 mg by mouth every Monday, Wednesday, and Friday.   multivitamin with minerals tablet Take 1 tablet by mouth at bedtime.   pramipexole 0.5 MG tablet Commonly known as: MIRAPEX TAKE 1 TABLET (0.5 MG TOTAL) BY MOUTH DAILY AS NEEDED (TAKE ALONG WITH EXTENDED RELEASE DOSE) What changed: See the new instructions.   Pramipexole Dihydrochloride 0.75 MG Tb24 Commonly known as: Mirapex ER Take 1 tablet (0.75 mg total) by mouth daily. What changed: when to take this   Vitamin C 500 MG Caps Take 500 mg by mouth every Monday, Wednesday, and Friday.   Vitamin D 50 MCG (2000 UT) tablet Take 2,000 Units by mouth 2 (two) times daily.        Diagnostic Studies: No results found.  Disposition: Discharge disposition: 01-Home or Self Care       Discharge Instructions     Call MD / Call 911   Complete by: As directed     If you experience chest pain or shortness of breath, CALL 911 and be transported to the hospital emergency room.  If you develope a fever above 101 F, pus (white drainage) or increased drainage  or redness at the wound, or calf pain, call your surgeon's office.   Constipation Prevention   Complete by: As directed    Drink plenty of fluids.  Prune juice may be helpful.  You may use a stool softener, such as Colace (over the counter) 100 mg twice a day.  Use MiraLax (over the counter) for constipation as needed.   Diet - low sodium heart healthy   Complete by: As directed    Discharge instructions   Complete by: As directed    You may shower, dressing is waterproof.  Do not remove the dressing, we will remove it at your first post-op appointment.  Do not take a bath or soak the knee in a tub or pool.  You may weightbear as you can tolerate on the operative leg with a walker.  Continue using the CPM machine 3 times per day for one hour each time, increasing the degrees of range of motion daily.  Use the blue cradle boot under your heel to work on getting your leg straight.  Do NOT put a pillow under your knee.  You will follow-up with Dr. August Saucer in the clinic in 2 weeks at your given appointment date.    INSTRUCTIONS AFTER JOINT REPLACEMENT   Remove items at home which could result in a fall. This includes throw rugs or furniture in walking pathways ICE to the affected joint every three hours while awake for 30 minutes at a time, for at least the first 3-5 days, and then as needed for pain and swelling.  Continue to use ice for pain and swelling. You may notice swelling that will progress down to the foot and ankle.  This is normal after surgery.  Elevate your leg when you are not up walking on it.   Continue to use the breathing machine you got in the hospital (incentive spirometer) which will help keep your temperature down.  It is common for your temperature to cycle up and down following surgery,  especially at night when you are not up moving around and exerting yourself.  The breathing machine keeps your lungs expanded and your temperature down.   DIET:  As you were doing prior to hospitalization, we recommend a well-balanced diet.  DRESSING / WOUND CARE / SHOWERING  Keep the surgical dressing until follow up.  The dressing is water proof, so you can shower without any extra covering.  IF THE DRESSING FALLS OFF or the wound gets wet inside, change the dressing with sterile gauze.  Please use good hand washing techniques before changing the dressing.  Do not use any lotions or creams on the incision until instructed by your surgeon.    ACTIVITY  Increase activity slowly as tolerated, but follow the weight bearing instructions below.   No driving for 6 weeks or until further direction given by your physician.  You cannot drive while taking narcotics.  No lifting or carrying greater than 10 lbs. until further directed by your surgeon. Avoid periods of inactivity such as sitting longer than an hour when not asleep. This helps prevent blood clots.  You may return to work once you are authorized by your doctor.     WEIGHT BEARING   Weight bearing as tolerated with assist device (walker, cane, etc) as directed, use it as long as suggested by your surgeon or therapist, typically at least 4-6 weeks.   EXERCISES  Results after joint replacement surgery are often greatly improved when you follow the exercise, range  of motion and muscle strengthening exercises prescribed by your doctor. Safety measures are also important to protect the joint from further injury. Any time any of these exercises cause you to have increased pain or swelling, decrease what you are doing until you are comfortable again and then slowly increase them. If you have problems or questions, call your caregiver or physical therapist for advice.   Rehabilitation is important following a joint replacement. After just a few  days of immobilization, the muscles of the leg can become weakened and shrink (atrophy).  These exercises are designed to build up the tone and strength of the thigh and leg muscles and to improve motion. Often times heat used for twenty to thirty minutes before working out will loosen up your tissues and help with improving the range of motion but do not use heat for the first two weeks following surgery (sometimes heat can increase post-operative swelling).   These exercises can be done on a training (exercise) mat, on the floor, on a table or on a bed. Use whatever works the best and is most comfortable for you.    Use music or television while you are exercising so that the exercises are a pleasant break in your day. This will make your life better with the exercises acting as a break in your routine that you can look forward to.   Perform all exercises about fifteen times, three times per day or as directed.  You should exercise both the operative leg and the other leg as well.  Exercises include:   Quad Sets - Tighten up the muscle on the front of the thigh (Quad) and hold for 5-10 seconds.   Straight Leg Raises - With your knee straight (if you were given a brace, keep it on), lift the leg to 60 degrees, hold for 3 seconds, and slowly lower the leg.  Perform this exercise against resistance later as your leg gets stronger.  Leg Slides: Lying on your back, slowly slide your foot toward your buttocks, bending your knee up off the floor (only go as far as is comfortable). Then slowly slide your foot back down until your leg is flat on the floor again.  Angel Wings: Lying on your back spread your legs to the side as far apart as you can without causing discomfort.  Hamstring Strength:  Lying on your back, push your heel against the floor with your leg straight by tightening up the muscles of your buttocks.  Repeat, but this time bend your knee to a comfortable angle, and push your heel against the  floor.  You may put a pillow under the heel to make it more comfortable if necessary.   A rehabilitation program following joint replacement surgery can speed recovery and prevent re-injury in the future due to weakened muscles. Contact your doctor or a physical therapist for more information on knee rehabilitation.    CONSTIPATION  Constipation is defined medically as fewer than three stools per week and severe constipation as less than one stool per week.  Even if you have a regular bowel pattern at home, your normal regimen is likely to be disrupted due to multiple reasons following surgery.  Combination of anesthesia, postoperative narcotics, change in appetite and fluid intake all can affect your bowels.   YOU MUST use at least one of the following options; they are listed in order of increasing strength to get the job done.  They are all available over the counter, and you  may need to use some, POSSIBLY even all of these options:    Drink plenty of fluids (prune juice may be helpful) and high fiber foods Colace 100 mg by mouth twice a day  Senokot for constipation as directed and as needed Dulcolax (bisacodyl), take with full glass of water  Miralax (polyethylene glycol) once or twice a day as needed.  If you have tried all these things and are unable to have a bowel movement in the first 3-4 days after surgery call either your surgeon or your primary doctor.    If you experience loose stools or diarrhea, hold the medications until you stool forms back up.  If your symptoms do not get better within 1 week or if they get worse, check with your doctor.  If you experience "the worst abdominal pain ever" or develop nausea or vomiting, please contact the office immediately for further recommendations for treatment.   ITCHING:  If you experience itching with your medications, try taking only a single pain pill, or even half a pain pill at a time.  You can also use Benadryl over the counter for  itching or also to help with sleep.   TED HOSE STOCKINGS:  Use stockings on both legs until for at least 2 weeks or as directed by physician office. They may be removed at night for sleeping.  MEDICATIONS:  See your medication summary on the "After Visit Summary" that nursing will review with you.  You may have some home medications which will be placed on hold until you complete the course of blood thinner medication.  It is important for you to complete the blood thinner medication as prescribed.  PRECAUTIONS:  If you experience chest pain or shortness of breath - call 911 immediately for transfer to the hospital emergency department.   If you develop a fever greater that 101 F, purulent drainage from wound, increased redness or drainage from wound, foul odor from the wound/dressing, or calf pain - CONTACT YOUR SURGEON.                                                   FOLLOW-UP APPOINTMENTS:  If you do not already have a post-op appointment, please call the office for an appointment to be seen by your surgeon.  Guidelines for how soon to be seen are listed in your "After Visit Summary", but are typically between 1-4 weeks after surgery.  OTHER INSTRUCTIONS:   Knee Replacement:  Do not place pillow under knee, focus on keeping the knee straight while resting. CPM instructions: 0-90 degrees, 2 hours in the morning, 2 hours in the afternoon, and 2 hours in the evening. Place foam block, curve side up under heel at all times except when in CPM or when walking.  DO NOT modify, tear, cut, or change the foam block in any way.  POST-OPERATIVE OPIOID TAPER INSTRUCTIONS: It is important to wean off of your opioid medication as soon as possible. If you do not need pain medication after your surgery it is ok to stop day one. Opioids include: Codeine, Hydrocodone(Norco, Vicodin), Oxycodone(Percocet, oxycontin) and hydromorphone amongst others.  Long term and even short term use of opiods can  cause: Increased pain response Dependence Constipation Depression Respiratory depression And more.  Withdrawal symptoms can include Flu like symptoms Nausea, vomiting And more Techniques to manage  these symptoms Hydrate well Eat regular healthy meals Stay active Use relaxation techniques(deep breathing, meditating, yoga) Do Not substitute Alcohol to help with tapering If you have been on opioids for less than two weeks and do not have pain than it is ok to stop all together.  Plan to wean off of opioids This plan should start within one week post op of your joint replacement. Maintain the same interval or time between taking each dose and first decrease the dose.  Cut the total daily intake of opioids by one tablet each day Next start to increase the time between doses. The last dose that should be eliminated is the evening dose.   MAKE SURE YOU:  Understand these instructions.  Get help right away if you are not doing well or get worse.    Thank you for letting us be a part of your medical care team.  It is a privilege we respect greatly.  We hope these instructions will help you stay on track for a fast and full recovery!    Dental Antibiotics:  In most cases prophylactic antibiotics for Dental procdeures after total joint surgery are not necessary.  Exceptions are as follows:  1. History of prior total joint infection  2. Severely immunocompromised (Organ Transplant, cancer chemotherapy, Rheumatoid biologic meds such as Humera)  3. Poorly controlled diabetes (A1C &gt; 8.0, blood glucose over 200)  If you have one of these conditions, contact your surgeon for an antibiotic prescription, prior to your dental procedure.   Increase activity slowly as tolerated   Complete by: As directed    Post-operative opioid taper instructions:   Complete by: As directed    POST-OPERATIVE OPIOID TAPER INSTRUCTIONS: It is important to wean off of your opioid medication as soon as  possible. If you do not need pain medication after your surgery it is ok to stop day one. Opioids include: Codeine, Hydrocodone(Norco, Vicodin), Oxycodone(Percocet, oxycontin) and hydromorphone amongst others.  Long term and even short term use of opiods can cause: Increased pain response Dependence Constipation Depression Respiratory depression And more.  Withdrawal symptoms can include Flu like symptoms Nausea, vomiting And more Techniques to manage these symptoms Hydrate well Eat regular healthy meals Stay active Use relaxation techniques(deep breathing, meditating, yoga) Do Not substitute Alcohol to help with tapering If you have been on opioids for less than two weeks and do not have pain than it is ok to stop all together.  Plan to wean off of opioids This plan should start within one week post op of your joint replacement. Maintain the same interval or time between taking each dose and first decrease the dose.  Cut the total daily intake of opioids by one tablet each day Next start to increase the time between doses. The last dose that should be eliminated is the evening dose.           Follow-up Information     August Saucer Corrie Mckusick, MD. Go on 09/18/2023.   Specialty: Orthopedic Surgery Why: at 1:00 pm for your first in office post-operative appointment with Karenann Cai, PA-C for Dr. August Saucer. Contact information: [address] [place] [address] [phone]         [facility] Follow up.   Why: Someone from the home health agency will be in contact with you to arrange your first in home physical therapy appointment. Contact information: [address] [place] [address] [phone]  Medicine, [facility] Follow up.   Why: at 3:00 pm for your first Outpatient Physical Therapy appointment. Please arrive 15 minutes early to complete any paperwork. Contact information: [address]  [place] [address] [phone]                  Signed: Karenann Cai 09/12/2023, 11:34 AM

## 2023-09-16 ENCOUNTER — Encounter: Payer: Self-pay | Admitting: Neurology

## 2023-09-16 DIAGNOSIS — G4733 Obstructive sleep apnea (adult) (pediatric): Secondary | ICD-10-CM | POA: Diagnosis not present

## 2023-09-16 DIAGNOSIS — Z471 Aftercare following joint replacement surgery: Secondary | ICD-10-CM | POA: Diagnosis not present

## 2023-09-16 DIAGNOSIS — Z853 Personal history of malignant neoplasm of breast: Secondary | ICD-10-CM | POA: Diagnosis not present

## 2023-09-16 DIAGNOSIS — G2581 Restless legs syndrome: Secondary | ICD-10-CM | POA: Diagnosis not present

## 2023-09-16 DIAGNOSIS — F419 Anxiety disorder, unspecified: Secondary | ICD-10-CM | POA: Diagnosis not present

## 2023-09-16 DIAGNOSIS — Z96651 Presence of right artificial knee joint: Secondary | ICD-10-CM | POA: Diagnosis not present

## 2023-09-16 DIAGNOSIS — Z9181 History of falling: Secondary | ICD-10-CM | POA: Diagnosis not present

## 2023-09-16 MED ORDER — PRAMIPEXOLE DIHYDROCHLORIDE ER 0.75 MG PO TB24: 0.7500 mg | ORAL_TABLET | Freq: Every day | ORAL | 1 refills | Status: DC

## 2023-09-16 MED ORDER — PRAMIPEXOLE DIHYDROCHLORIDE 0.5 MG PO TABS: 0.5000 mg | ORAL_TABLET | Freq: Every day | ORAL | 1 refills | Status: DC | PRN

## 2023-09-18 ENCOUNTER — Ambulatory Visit (INDEPENDENT_AMBULATORY_CARE_PROVIDER_SITE_OTHER): Payer: Medicare PPO | Admitting: Surgical

## 2023-09-18 ENCOUNTER — Other Ambulatory Visit (INDEPENDENT_AMBULATORY_CARE_PROVIDER_SITE_OTHER): Payer: Self-pay

## 2023-09-18 DIAGNOSIS — Z96651 Presence of right artificial knee joint: Secondary | ICD-10-CM

## 2023-09-18 DIAGNOSIS — G8929 Other chronic pain: Secondary | ICD-10-CM | POA: Diagnosis not present

## 2023-09-18 DIAGNOSIS — M25561 Pain in right knee: Secondary | ICD-10-CM | POA: Diagnosis not present

## 2023-09-19 DIAGNOSIS — Z96651 Presence of right artificial knee joint: Secondary | ICD-10-CM | POA: Diagnosis not present

## 2023-09-19 DIAGNOSIS — F419 Anxiety disorder, unspecified: Secondary | ICD-10-CM | POA: Diagnosis not present

## 2023-09-19 DIAGNOSIS — G2581 Restless legs syndrome: Secondary | ICD-10-CM | POA: Diagnosis not present

## 2023-09-19 DIAGNOSIS — Z9181 History of falling: Secondary | ICD-10-CM | POA: Diagnosis not present

## 2023-09-19 DIAGNOSIS — G4733 Obstructive sleep apnea (adult) (pediatric): Secondary | ICD-10-CM | POA: Diagnosis not present

## 2023-09-19 DIAGNOSIS — Z471 Aftercare following joint replacement surgery: Secondary | ICD-10-CM | POA: Diagnosis not present

## 2023-09-19 DIAGNOSIS — Z853 Personal history of malignant neoplasm of breast: Secondary | ICD-10-CM | POA: Diagnosis not present

## 2023-09-22 ENCOUNTER — Encounter: Payer: Self-pay | Admitting: Surgical

## 2023-09-22 NOTE — Progress Notes (Signed)
Post-Op Visit Note   Patient: Alexis Fox           Date of Birth: [birth year]           MRN: [id number] Visit Date: 09/18/2023 PCP: Thana Ates, MD   Assessment & Plan:  Chief Complaint:  Chief Complaint  Patient presents with   Right Knee - Routine Post Op   Visit Diagnoses:  1. Chronic pain of right knee     Plan: Alexis Fox is a 67 y.o. female who presents s/p right total knee arthroplasty on 09/03/2023.  Doing well overall.  Using CPM machine up to 112 degrees but that she backed off to about 70-80 degrees.  They deny any calf pain, shortness of breath, chest pain, abdominal pain.  Pain is overall controlled and taking oxycodone for pain control about 2-3 times per day.  Taking aspirin twice daily for DVT prophylaxis.  Ambulating with walker.   On exam, patient has range of motion 0 degrees extension to 95 degrees of knee flexion.  Incision is healing well without evidence of infection or dehiscence.  2+ DP pulse of the operative extremity.  No calf tenderness, negative Homans' sign.  Able to perform straight leg raise.  Intact ankle dorsiflexion.  Plan is continue with range of motion exercises.  Will start Harriett Sine in outpatient physical therapy to focus on continuing her range of motion improvement and gait training in order to progress from walker to cane.  Overall she is progressing very well and her range of motion is off to an excellent start..  Follow-up in 4 weeks for clinical recheck with Dr. August Saucer  Follow-Up Instructions: No follow-ups on file.   Orders:  Orders Placed This Encounter  Procedures   XR KNEE 3 VIEW RIGHT   Ambulatory referral to Physical Therapy   No orders of the defined types were placed in this encounter.   Imaging: No results found.  PMFS History: Patient Active Problem List   Diagnosis Date Noted   DJD (degenerative joint disease) of knee 09/03/2023   S/P total knee arthroplasty, right 09/03/2023   Unilateral primary  osteoarthritis, right knee 12/28/2022   Pain in left ankle and joints of left foot 09/13/2022   Hypersomnia, persistent 08/21/2022   Excessive daytime sleepiness 08/21/2022   MCI (mild cognitive impairment) 08/21/2022   History of breast cancer 08/21/2022   OSA on CPAP 08/21/2022   Pain in left shoulder 03/01/2021   Allergic rhinitis 02/21/2021   Anxiety 02/21/2021   Contact dermatitis due to plant 02/21/2021   Dysphagia 02/21/2021   Estrogen receptor negative status (ER-) 02/21/2021   Gastroesophageal reflux disease 02/21/2021   Gastroparesis 02/21/2021   Insomnia 02/21/2021   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   Morton's neuroma of left foot 02/21/2021   Personal history of malignant neoplasm of breast 02/21/2021   Vitamin D deficiency 02/21/2021   Oth nondisplaced dens fracture, init for clos fx (HCC) 02/04/2020   S/P cervical spinal fusion 01/22/2020   Memory deficit 01/14/2020   Sacroiliac dysfunction 01/14/2020   Neck pain 11/05/2019   Body mass index (BMI) 25.0-25.9, adult 11/05/2019   Unilateral primary osteoarthritis, right hip 09/01/2019   Trochanteric bursitis, left hip 07/14/2019   S/P lumbar fusion 05/18/2019   OSA (obstructive sleep apnea) 10/28/2018   Pain managed using patient-controlled analgesia (PCA) 10/25/2018   Fall 10/24/2018   Fall from ladder 10/23/2018   DDD (degenerative disc disease), lumbosacral 09/15/2018   Chemotherapy-induced neuropathy (HCC)  09/15/2018   Central line complication 10/22/2016   Genetic testing 05/15/2016   Family history of breast cancer    Family history of brain cancer    Breast cancer of upper-outer quadrant of left female breast (HCC) 03/20/2016   Transaminitis 10/03/2015   Symptomatic cholelithiasis 10/03/2015   RUQ abdominal pain    Lumbosacral neuritis 11/10/2014   Lumbar radiculopathy 10/19/2014   Depression 06/24/2013   RLS (restless legs syndrome) 06/24/2013   Low back pain 05/19/2013   Past  Medical History:  Diagnosis Date   Anxiety    Arthritis    knees   Breast cancer (HCC)    Breast cancer of upper-outer quadrant of left female breast (HCC) 03/20/2016   C2 cervical fracture (HCC)    10/22/18, following fall from ladder   Cervicalgia    Cholecystitis    Complication of anesthesia    BP "crashes" post op   Early cataracts, bilateral    Family history of brain cancer    Family history of breast cancer    Hot flashes    Personal history of chemotherapy    Personal history of radiation therapy    Restless leg syndrome    Sleep apnea 10/03/2018   wears CPAP    Family History  Problem Relation Age of Onset   Heart disease Mother    Pulmonary fibrosis Mother    Hypertension Mother    Sleep apnea Mother    Cancer Father 23       astocytoma   Hypertension Brother    Lymphoma Maternal Aunt    Anesthesia problems Neg Hx     Past Surgical History:  Procedure Laterality Date   ANTERIOR CERVICAL DECOMP/DISCECTOMY FUSION N/A 01/22/2020   Procedure: ACDF - C2-C3;  Surgeon: Tia Alert, MD;  Location: [facility] OR;  Service: Neurosurgery;  Laterality: N/A;   BACK SURGERY     x2   BREAST BIOPSY     BREAST LUMPECTOMY Left 2018   BUNIONECTOMY     CHOLECYSTECTOMY N/A 10/04/2015   Procedure: LAPAROSCOPIC CHOLECYSTECTOMY WITH INTRAOPERATIVE CHOLANGIOGRAM;  Surgeon: Gaynelle Adu, MD;  Location: [facility] OR;  Service: General;  Laterality: N/A;   ELBOW SURGERY     right for epicondylitis 2010    ESOPHAGEAL MANOMETRY N/A 11/30/2020   Procedure: ESOPHAGEAL MANOMETRY (EM);  Surgeon: Kerin Salen, MD;  Location: [facility];  Service: Gastroenterology;  Laterality: N/A;   FOOT SURGERY     1983 -tarsal tunnel release   IR GENERIC HISTORICAL  10/26/2016   IR CV LINE INJECTION 10/26/2016 [facility]   LUMBAR DISC SURGERY  04/04/2012   MASS EXCISION Left 02/06/2021   Procedure: EXCISION LEFT FOOT SOFT TISSUE BETWEEN SECOND AND THIRD AND THIRD AND FOURTH TOES;  Surgeon: Asencion Islam, DPM;   Location: [facility];  Service: Podiatry;  Laterality: Left;   PORT-A-CATH REMOVAL N/A 11/15/2016   Procedure: REMOVAL PORT-A-CATH;  Surgeon: Emelia Loron, MD;  Location: [facility];  Service: General;  Laterality: N/A;   PORTACATH PLACEMENT Right 04/02/2016   Procedure: INSERTION PORT-A-CATH WITH [place];  Surgeon: Emelia Loron, MD;  Location: [facility];  Service: General;  Laterality: Right;   RADIOACTIVE SEED GUIDED PARTIAL MASTECTOMY/AXILLARY SENTINEL NODE BIOPSY/AXILLARY NODE DISSECTION Left 09/12/2016   Procedure: LEFT BREAST SEED GUIDED LUMPECTOMY, LEFT AXILLARY SENTINEL NODE BIOPSY, LEFT SEED GUIDED AXILLARY NODE EXCISION( TARGETED AXILLARY DISSECTION), BLUE DYE INJECTION;  Surgeon: Emelia Loron, MD;  Location: [facility];  Service: General;  Laterality:  Left;   SACROILIAC JOINT FUSION Left 05/18/2020   Procedure: LEFT SACROILIAC JOINT FUSION;  Surgeon: Estill Bamberg, MD;  Location: [facility] OR;  Service: Orthopedics;  Laterality: Left;   SPINAL FUSION  5/12   L5-S1   SPINAL FUSION  05/18/2019   L4-5   TARSAL TUNNEL RELEASE     TOTAL KNEE ARTHROPLASTY Right 09/03/2023   Procedure: RIGHT TOTAL KNEE ARTHROPLASTY;  Surgeon: Cammy Copa, MD;  Location: [facility];  Service: Orthopedics;  Laterality: Right;   Social History   Occupational History   Not on file  Tobacco Use   Smoking status: Never   Smokeless tobacco: Never  Vaping Use   Vaping status: Never Used  Substance and Sexual Activity   Alcohol use: Yes    Alcohol/week: 1.0 standard drink of alcohol    Types: 1 Cans of beer per week    Comment: Rarely 1/month   Drug use: No   Sexual activity: Not Currently    Birth control/protection: Post-menopausal

## 2023-09-23 NOTE — Therapy (Signed)
OUTPATIENT PHYSICAL THERAPY  EVALUATION   Patient Name: Alexis Fox MRN: [id number] DOB:14-Jan-1956, 67 y.o., female Today's Date: 09/24/2023  END OF SESSION:  PT End of Session - 09/24/23 1139     Visit Number 1    Number of Visits 24    Date for PT Re-Evaluation 12/17/23    Authorization Type [employer] $20 copay    PT Start Time 1141    PT Stop Time 1222    PT Time Calculation (min) 41 min    Activity Tolerance Patient tolerated treatment well    Behavior During Therapy WFL for tasks assessed/performed             Past Medical History:  Diagnosis Date   Anxiety    Arthritis    knees   Breast cancer (HCC)    Breast cancer of upper-outer quadrant of left female breast (HCC) 03/20/2016   C2 cervical fracture (HCC)    10/22/18, following fall from ladder   Cervicalgia    Cholecystitis    Complication of anesthesia    BP "crashes" post op   Early cataracts, bilateral    Family history of brain cancer    Family history of breast cancer    Hot flashes    Personal history of chemotherapy    Personal history of radiation therapy    Restless leg syndrome    Sleep apnea 10/03/2018   wears CPAP   Past Surgical History:  Procedure Laterality Date   ANTERIOR CERVICAL DECOMP/DISCECTOMY FUSION N/A 01/22/2020   Procedure: ACDF - C2-C3;  Surgeon: Tia Alert, MD;  Location: [facility] OR;  Service: Neurosurgery;  Laterality: N/A;   BACK SURGERY     x2   BREAST BIOPSY     BREAST LUMPECTOMY Left 2018   BUNIONECTOMY     CHOLECYSTECTOMY N/A 10/04/2015   Procedure: LAPAROSCOPIC CHOLECYSTECTOMY WITH INTRAOPERATIVE CHOLANGIOGRAM;  Surgeon: Gaynelle Adu, MD;  Location: [facility] OR;  Service: General;  Laterality: N/A;   ELBOW SURGERY     right for epicondylitis 2010    ESOPHAGEAL MANOMETRY N/A 11/30/2020   Procedure: ESOPHAGEAL MANOMETRY (EM);  Surgeon: Kerin Salen, MD;  Location: [facility];  Service: Gastroenterology;  Laterality: N/A;   FOOT SURGERY     1983 -tarsal  tunnel release   IR GENERIC HISTORICAL  10/26/2016   IR CV LINE INJECTION 10/26/2016 [facility]   LUMBAR DISC SURGERY  04/04/2012   MASS EXCISION Left 02/06/2021   Procedure: EXCISION LEFT FOOT SOFT TISSUE BETWEEN SECOND AND THIRD AND THIRD AND FOURTH TOES;  Surgeon: Asencion Islam, DPM;  Location: [facility];  Service: Podiatry;  Laterality: Left;   PORT-A-CATH REMOVAL N/A 11/15/2016   Procedure: REMOVAL PORT-A-CATH;  Surgeon: Emelia Loron, MD;  Location: [facility];  Service: General;  Laterality: N/A;   PORTACATH PLACEMENT Right 04/02/2016   Procedure: INSERTION PORT-A-CATH WITH [place];  Surgeon: Emelia Loron, MD;  Location: [facility];  Service: General;  Laterality: Right;   RADIOACTIVE SEED GUIDED PARTIAL MASTECTOMY/AXILLARY SENTINEL NODE BIOPSY/AXILLARY NODE DISSECTION Left 09/12/2016   Procedure: LEFT BREAST SEED GUIDED LUMPECTOMY, LEFT AXILLARY SENTINEL NODE BIOPSY, LEFT SEED GUIDED AXILLARY NODE EXCISION( TARGETED AXILLARY DISSECTION), BLUE DYE INJECTION;  Surgeon: Emelia Loron, MD;  Location:  SUR[facility]ERY [facility];  Service: General;  Laterality: Left;   SACROILIAC JOINT FUSION Left 05/18/2020   Procedure: LEFT SACROILIAC JOINT FUSION;  Surgeon: Estill Bamberg, MD;  Location: [facility];  Service: Orthopedics;  Laterality: Left;   SPINAL  FUSION  5/12   L5-S1   SPINAL FUSION  05/18/2019   L4-5   TARSAL TUNNEL RELEASE     TOTAL KNEE ARTHROPLASTY Right 09/03/2023   Procedure: RIGHT TOTAL KNEE ARTHROPLASTY;  Surgeon: Cammy Copa, MD;  Location: [facility] OR;  Service: Orthopedics;  Laterality: Right;   Patient Active Problem List   Diagnosis Date Noted   DJD (degenerative joint disease) of knee 09/03/2023   S/P total knee arthroplasty, right 09/03/2023   Unilateral primary osteoarthritis, right knee 12/28/2022   Pain in left ankle and joints of left foot 09/13/2022   Hypersomnia, persistent 08/21/2022   Excessive daytime  sleepiness 08/21/2022   MCI (mild cognitive impairment) 08/21/2022   History of breast cancer 08/21/2022   OSA on CPAP 08/21/2022   Pain in left shoulder 03/01/2021   Allergic rhinitis 02/21/2021   Anxiety 02/21/2021   Contact dermatitis due to plant 02/21/2021   Dysphagia 02/21/2021   Estrogen receptor negative status (ER-) 02/21/2021   Gastroesophageal reflux disease 02/21/2021   Gastroparesis 02/21/2021   Insomnia 02/21/2021   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   Morton's neuroma of left foot 02/21/2021   Personal history of malignant neoplasm of breast 02/21/2021   Vitamin D deficiency 02/21/2021   Oth nondisplaced dens fracture, init for clos fx (HCC) 02/04/2020   S/P cervical spinal fusion 01/22/2020   Memory deficit 01/14/2020   Sacroiliac dysfunction 01/14/2020   Neck pain 11/05/2019   Body mass index (BMI) 25.0-25.9, adult 11/05/2019   Unilateral primary osteoarthritis, right hip 09/01/2019   Trochanteric bursitis, left hip 07/14/2019   S/P lumbar fusion 05/18/2019   OSA (obstructive sleep apnea) 10/28/2018   Pain managed using patient-controlled analgesia (PCA) 10/25/2018   Fall 10/24/2018   Fall from ladder 10/23/2018   DDD (degenerative disc disease), lumbosacral 09/15/2018   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   Central line complication 10/22/2016   Genetic testing 05/15/2016   Family history of breast cancer    Family history of brain cancer    Breast cancer of upper-outer quadrant of left female breast (HCC) 03/20/2016   Transaminitis 10/03/2015   Symptomatic cholelithiasis 10/03/2015   RUQ abdominal pain    Lumbosacral neuritis 11/10/2014   Lumbar radiculopathy 10/19/2014   Depression 06/24/2013   RLS (restless legs syndrome) 06/24/2013   Low back pain 05/19/2013    PCP: Thana Ates MD  REFERRING PROVIDER: Julieanne Cotton, PA-C   REFERRING DIAG: [phone] (ICD-10-CM) - Chronic pain of right knee    THERAPY DIAG:  Chronic pain of right knee  Muscle weakness (generalized)  Stiffness of right knee, not elsewhere classified  Difficulty in walking, not elsewhere classified  Localized edema  Rationale for Evaluation and Treatment: Rehabilitation  ONSET DATE: 09/03/2023 Rt TKA  SUBJECTIVE:   SUBJECTIVE STATEMENT: Pt came to clinic s/p recent Rt TKA on 09/03/2023.  Pt indicated taking pain medicine about 2x/day.  Still has CPM at home.  Having trouble with sleeping due to discomfort. Pt indicated getting to sleep most troublesome.   Had home health PT until last week.  Inactivity causes stiffness.    PERTINENT HISTORY: breast CA, ACDF, lumbar fusion, history of C2 fracture,    PAIN:  NPRS scale: at worst 8/10, at current 2/10 Pain location: Rt knee  Pain description: stiffness, tight, achy Aggravating factors: prolonged positioning, transfers (car transfers), increased WB activity, getting comfortable to sleep.  Relieving factors: pain medicine, ice   PRECAUTIONS: None  WEIGHT BEARING RESTRICTIONS: No  FALLS:  Has patient fallen in last 6 months? 1 fall day of surgery in bathroom at hospital  No fear of falling reported.   LIVING ENVIRONMENT: Lives in: House/apartment Stairs: 1 step to entry with bilateral rails.  Has following equipment at home: Cane, walker  OCCUPATION: No work  PLOF: Independent, likes to camp in RV trailer, has dogs.  Has silver sneakers  PATIENT GOALS: Reduce pain, get back to walking  OBJECTIVE:   PATIENT SURVEYS:  09/24/2023 FOTO intake: 40   predicted:  53  COGNITION: 09/24/2023 Overall cognitive status: WFL    SENSATION: 09/24/2023 None tested today  EDEMA:  09/24/2023 Localized edema noted visually Rt knee/lower leg  MUSCLE LENGTH: 09/24/2023 None tested today  POSTURE:  09/24/2023 Weight deviation to Lt in standing.   PALPATION: 09/24/2023 General tenderness to touch.   LOWER EXTREMITY ROM:   ROM  Right 09/24/2023 Left 09/24/2023  Hip flexion    Hip extension    Hip abduction    Hip adduction    Hip internal rotation    Hip external rotation    Knee flexion 111 AROM in supine heel slide   Knee extension 0 AROM in sitting LAQ   Ankle dorsiflexion    Ankle plantarflexion    Ankle inversion    Ankle eversion     (Blank rows = not tested)  LOWER EXTREMITY MMT:  MMT Right 09/24/2023 Left 09/24/2023  Hip flexion 5/5 5/5  Hip extension    Hip abduction    Hip adduction    Hip internal rotation    Hip external rotation    Knee flexion 4/5 5/5  Knee extension 4/5 5/5  Ankle dorsiflexion 5/5 5/5  Ankle plantarflexion    Ankle inversion    Ankle eversion     (Blank rows = not tested)  LOWER EXTREMITY SPECIAL TESTS:  09/24/2023 None tested today  FUNCTIONAL TESTS:  09/24/2023 18 inch chair transfer: able to perform on 1st attempt but with increased difficulty Lt SLS:  unable Rt SLS:  unable   GAIT: 09/24/2023 Mod independent c SPC in Lt UE.  Reduced stance on Rt leg with weight shift off Rt.  Reduced toe off progression.                                                                                                                                                                         TODAY'S TREATMENT  DATE:09/24/2023 Therex:    HEP instruction/performance c cues for techniques, handout provided.  Trial set performed of each for comprehension and symptom assessment.  See below for exercise list  Vaso 10 mins Rt knee in elevation medium compression 34 deg.   PATIENT EDUCATION:  09/24/2023 Education details: HEP, POC Person educated: Patient Education method: Programmer, multimedia, Demonstration, Verbal cues, and Handouts Education comprehension: verbalized understanding, returned demonstration, and verbal cues required  HOME EXERCISE PROGRAM: Access Code: [email or web address] URL:  [email or web address] Date: 09/24/2023 Prepared by: Chyrel Masson  Exercises - Supine Heel Slide  - 3-5 x daily - 7 x weekly - 1-2 sets - 10 reps - 2 hold - Seated Quad Set  - 3-5 x daily - 7 x weekly - 1 sets - 10 reps - 5 hold - Seated Straight Leg Heel Taps  - 1-2 x daily - 7 x weekly - 3 sets - 10 reps - Seated Long Arc Quad  - 3-5 x daily - 7 x weekly - 1-2 sets - 10 reps - 2 hold - Sit to Stand  - 3 x daily - 7 x weekly - 1 sets - 10 reps  ASSESSMENT:  CLINICAL IMPRESSION: Patient is a 67 y.o. who comes to clinic with complaints of Rt knee pain s/p recent Rt TKA on 09/03/2023 with mobility, strength and movement coordination deficits that impair their ability to perform usual daily and recreational functional activities without increase difficulty/symptoms at this time.  Patient to benefit from skilled PT services to address impairments and limitations to improve to previous level of function without restriction secondary to condition.   OBJECTIVE IMPAIRMENTS: Abnormal gait, decreased activity tolerance, decreased balance, decreased coordination, decreased endurance, decreased mobility, difficulty walking, decreased ROM, decreased strength, hypomobility, increased edema, increased fascial restrictions, impaired perceived functional ability, impaired flexibility, improper body mechanics, and pain.   ACTIVITY LIMITATIONS: carrying, lifting, bending, sitting, standing, squatting, sleeping, stairs, transfers, bed mobility, and locomotion level  PARTICIPATION LIMITATIONS: meal prep, cleaning, laundry, interpersonal relationship, driving, shopping, community activity, and yard work  PERSONAL FACTORS:  breast CA, ACDF, lumbar fusion, history of C2 fracture,   are also affecting patient's functional outcome.   REHAB POTENTIAL: Good  CLINICAL DECISION MAKING: Stable/uncomplicated  EVALUATION COMPLEXITY: Low   GOALS: Goals reviewed with patient? Yes  SHORT TERM GOALS:  (target date for Short term goals are 3 weeks 10/15/2023)   1.  Patient will demonstrate independent use of home exercise program to maintain progress from in clinic treatments.  Goal status: New  LONG TERM GOALS: (target dates for all long term goals are 12 weeks  12/17/2023 )   1. Patient will demonstrate/report pain at worst less than or equal to 2/10 to facilitate minimal limitation in daily activity secondary to pain symptoms.  Goal status: New   2. Patient will demonstrate independent use of home exercise program to facilitate ability to maintain/progress functional gains from skilled physical therapy services.  Goal status: New   3. Patient will demonstrate FOTO outcome > or = 53 % to indicate reduced disability due to condition.  Goal status: New   4.  Patient will demonstrate Rt LE MMT 5/5 throughout to faciltiate usual transfers, stairs, squatting at [facility] for daily life.   Goal status: New   5.  Patient will demonstrate Rt knee AROM 0-120 deg to facilitate sitting, transfers, ambulation s deviation due to range.  Goal status: New   6.  Patient will demonstrate ascending/descending stairs reciprocally s UE assist for community integration.  Goal status: New   7.  Patient will demonstrate independent ambulation community distances > 300 ft for community integration.  Goal Status: New   PLAN:  PT FREQUENCY: 1-2x/week  PT DURATION: 12 weeks  PLANNED INTERVENTIONS: Can include 82956- PT Re-evaluation, 97110-Therapeutic exercises, 97530- Therapeutic activity, [id number]- Neuromuscular re-education, 97535- Self Care, 97140- Manual therapy, [phone]- Gait training, [phone]- Orthotic Fit/training, [phone]- Canalith repositioning, [id number]- Aquatic Therapy, 97014- Electrical stimulation (unattended), [id number]- Electrical stimulation (manual), [id number]- Vasopneumatic device, [id number]- Ultrasound, [id number]- Traction (mechanical), [id number]- Ionotophoresis 4mg /ml Dexamethasone, Patient/Family education,  Balance training, Stair training, Taping, Dry Needling, Joint mobilization, Joint manipulation, Spinal manipulation, Spinal mobilization, Scar mobilization, Vestibular training, Visual/preceptual remediation/compensation, DME instructions, Cryotherapy, and Moist heat.  All performed as medically necessary.  All included unless contraindicated  PLAN FOR NEXT SESSION: Review HEP knowledge/results.  Progressive strengthening.  Add bike use   Chyrel Masson, PT, DPT, OCS, ATC 09/24/23  12:24 PM   Referring diagnosis? M25.561,G89.29 (ICD-10-CM) - Chronic pain of right knee Treatment diagnosis? (if different than referring diagnosis) M25.561,G89.29 (ICD-10-CM) - Chronic pain of right knee What was this (referring dx) caused by? [x]  Surgery []  Fall []  Ongoing issue []  Arthritis []  Other: ____________  Laterality: [x]  Rt []  Lt []  Both  Check all possible CPT codes:  *CHOOSE 10 OR LESS*    []  97110 (Therapeutic Exercise)  []  92507 (SLP Treatment)  []  97112 (Neuro Re-ed)   []  92526 (Swallowing Treatment)   []  52841 (Gait Training)   []  [id number] (Cognitive Training, 1st 15 minutes) []  97140 (Manual Therapy)   []  97130 (Cognitive Training, each add'l 15 minutes)  []  32440 (Re-evaluation)                              []  Other, List CPT Code ____________  []  97530 (Therapeutic Activities)     []  97535 (Self Care)   [x]  All codes above (97110 - 97535)  []  97012 (Mechanical Traction)  []  97014 (E-stim Unattended)  []  97032 (E-stim manual)  []  97033 (Ionto)  []  97035 (Ultrasound) [x]  97750 (Physical Performance Training) []  [id number] (Aquatic Therapy) [x]  97016 (Vasopneumatic Device) []  [id number] (Paraffin) []  97034 (Contrast Bath) []  97597 (Wound Care 1st 20 sq cm) []  97598 (Wound Care each add'l 20 sq cm) []  97760 (Orthotic Fabrication, Fitting, Training Initial) []  [id number] (Prosthetic Management and Training Initial) []  [id number] (Orthotic or Prosthetic Training/ Modification Subsequent)

## 2023-09-24 ENCOUNTER — Encounter: Payer: Self-pay | Admitting: Rehabilitative and Restorative Service Providers"

## 2023-09-24 ENCOUNTER — Ambulatory Visit (INDEPENDENT_AMBULATORY_CARE_PROVIDER_SITE_OTHER): Payer: Medicare PPO | Admitting: Rehabilitative and Restorative Service Providers"

## 2023-09-24 ENCOUNTER — Other Ambulatory Visit: Payer: Self-pay

## 2023-09-24 DIAGNOSIS — M25661 Stiffness of right knee, not elsewhere classified: Secondary | ICD-10-CM

## 2023-09-24 DIAGNOSIS — M25561 Pain in right knee: Secondary | ICD-10-CM

## 2023-09-24 DIAGNOSIS — R262 Difficulty in walking, not elsewhere classified: Secondary | ICD-10-CM | POA: Diagnosis not present

## 2023-09-24 DIAGNOSIS — M6281 Muscle weakness (generalized): Secondary | ICD-10-CM

## 2023-09-24 DIAGNOSIS — R600 Localized edema: Secondary | ICD-10-CM | POA: Diagnosis not present

## 2023-09-24 DIAGNOSIS — G8929 Other chronic pain: Secondary | ICD-10-CM | POA: Diagnosis not present

## 2023-09-25 DIAGNOSIS — G4733 Obstructive sleep apnea (adult) (pediatric): Secondary | ICD-10-CM | POA: Diagnosis not present

## 2023-09-26 ENCOUNTER — Ambulatory Visit (INDEPENDENT_AMBULATORY_CARE_PROVIDER_SITE_OTHER): Payer: Medicare PPO | Admitting: Physical Therapy

## 2023-09-26 ENCOUNTER — Encounter: Payer: Self-pay | Admitting: Physical Therapy

## 2023-09-26 DIAGNOSIS — R600 Localized edema: Secondary | ICD-10-CM

## 2023-09-26 DIAGNOSIS — R262 Difficulty in walking, not elsewhere classified: Secondary | ICD-10-CM

## 2023-09-26 DIAGNOSIS — G8929 Other chronic pain: Secondary | ICD-10-CM | POA: Diagnosis not present

## 2023-09-26 DIAGNOSIS — M25661 Stiffness of right knee, not elsewhere classified: Secondary | ICD-10-CM

## 2023-09-26 DIAGNOSIS — M6281 Muscle weakness (generalized): Secondary | ICD-10-CM | POA: Diagnosis not present

## 2023-09-26 DIAGNOSIS — M25561 Pain in right knee: Secondary | ICD-10-CM | POA: Diagnosis not present

## 2023-09-26 NOTE — Therapy (Signed)
OUTPATIENT PHYSICAL THERAPY  TREATMENT   Patient Name: Alexis Fox MRN: [id number] DOB:[birth year], 67 y.o., female Today's Date: 09/26/2023  END OF SESSION:  PT End of Session - 09/26/23 0930     Visit Number 2    Number of Visits 24    Date for PT Re-Evaluation 12/17/23    Authorization Type [employer] $20 copay    PT Start Time 0930    PT Stop Time 1025    PT Time Calculation (min) 55 min    Activity Tolerance Patient tolerated treatment well    Behavior During Therapy WFL for tasks assessed/performed              Past Medical History:  Diagnosis Date   Anxiety    Arthritis    knees   Breast cancer (HCC)    Breast cancer of upper-outer quadrant of left female breast (HCC) 03/20/2016   C2 cervical fracture (HCC)    10/22/18, following fall from ladder   Cervicalgia    Cholecystitis    Complication of anesthesia    BP "crashes" post op   Early cataracts, bilateral    Family history of brain cancer    Family history of breast cancer    Hot flashes    Personal history of chemotherapy    Personal history of radiation therapy    Restless leg syndrome    Sleep apnea 10/03/2018   wears CPAP   Past Surgical History:  Procedure Laterality Date   ANTERIOR CERVICAL DECOMP/DISCECTOMY FUSION N/A 01/22/2020   Procedure: ACDF - C2-C3;  Surgeon: Tia Alert, MD;  Location: [facility] OR;  Service: Neurosurgery;  Laterality: N/A;   BACK SURGERY     x2   BREAST BIOPSY     BREAST LUMPECTOMY Left 2018   BUNIONECTOMY     CHOLECYSTECTOMY N/A 10/04/2015   Procedure: LAPAROSCOPIC CHOLECYSTECTOMY WITH INTRAOPERATIVE CHOLANGIOGRAM;  Surgeon: Gaynelle Adu, MD;  Location: [facility] OR;  Service: General;  Laterality: N/A;   ELBOW SURGERY     right for epicondylitis 2010    ESOPHAGEAL MANOMETRY N/A 11/30/2020   Procedure: ESOPHAGEAL MANOMETRY (EM);  Surgeon: Kerin Salen, MD;  Location: [facility];  Service: Gastroenterology;  Laterality: N/A;   FOOT SURGERY     1983 -tarsal  tunnel release   IR GENERIC HISTORICAL  10/26/2016   IR CV LINE INJECTION 10/26/2016 [facility]INTER[facility] RAD   LUMBAR DISC SURGERY  04/04/2012   MASS EXCISION Left 02/06/2021   Procedure: EXCISION LEFT FOOT SOFT TISSUE BETWEEN SECOND AND THIRD AND THIRD AND FOURTH TOES;  Surgeon: Asencion Islam, DPM;  Location: [facility];  Service: Podiatry;  Laterality: Left;   PORT-A-CATH REMOVAL N/A 11/15/2016   Procedure: REMOVAL PORT-A-CATH;  Surgeon: Emelia Loron, MD;  Location: [facility];  Service: General;  Laterality: N/A;   PORTACATH PLACEMENT Right 04/02/2016   Procedure: INSERTION PORT-A-CATH WITH [place];  Surgeon: Emelia Loron, MD;  Location: [facility];  Service: General;  Laterality: Right;   RADIOACTIVE SEED GUIDED PARTIAL MASTECTOMY/AXILLARY SENTINEL NODE BIOPSY/AXILLARY NODE DISSECTION Left 09/12/2016   Procedure: LEFT BREAST SEED GUIDED LUMPECTOMY, LEFT AXILLARY SENTINEL NODE BIOPSY, LEFT SEED GUIDED AXILLARY NODE EXCISION( TARGETED AXILLARY DISSECTION), BLUE DYE INJECTION;  Surgeon: Emelia Loron, MD;  Location: [facility];  Service: General;  Laterality: Left;   SACROILIAC JOINT FUSION Left 05/18/2020   Procedure: LEFT SACROILIAC JOINT FUSION;  Surgeon: Estill Bamberg, MD;  Location: [facility] OR;  Service: Orthopedics;  Laterality: Left;  SPINAL FUSION  5/12   L5-S1   SPINAL FUSION  05/18/2019   L4-5   TARSAL TUNNEL RELEASE     TOTAL KNEE ARTHROPLASTY Right 09/03/2023   Procedure: RIGHT TOTAL KNEE ARTHROPLASTY;  Surgeon: Cammy Copa, MD;  Location: [facility] OR;  Service: Orthopedics;  Laterality: Right;   Patient Active Problem List   Diagnosis Date Noted   DJD (degenerative joint disease) of knee 09/03/2023   S/P total knee arthroplasty, right 09/03/2023   Unilateral primary osteoarthritis, right knee 12/28/2022   Pain in left ankle and joints of left foot 09/13/2022   Hypersomnia, persistent 08/21/2022   Excessive daytime  sleepiness 08/21/2022   MCI (mild cognitive impairment) 08/21/2022   History of breast cancer 08/21/2022   OSA on CPAP 08/21/2022   Pain in left shoulder 03/01/2021   Allergic rhinitis 02/21/2021   Anxiety 02/21/2021   Contact dermatitis due to plant 02/21/2021   Dysphagia 02/21/2021   Estrogen receptor negative status (ER-) 02/21/2021   Gastroesophageal reflux disease 02/21/2021   Gastroparesis 02/21/2021   Insomnia 02/21/2021   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   Morton's neuroma of left foot 02/21/2021   Personal history of malignant neoplasm of breast 02/21/2021   Vitamin D deficiency 02/21/2021   Oth nondisplaced dens fracture, init for clos fx (HCC) 02/04/2020   S/P cervical spinal fusion 01/22/2020   Memory deficit 01/14/2020   Sacroiliac dysfunction 01/14/2020   Neck pain 11/05/2019   Body mass index (BMI) 25.0-25.9, adult 11/05/2019   Unilateral primary osteoarthritis, right hip 09/01/2019   Trochanteric bursitis, left hip 07/14/2019   S/P lumbar fusion 05/18/2019   OSA (obstructive sleep apnea) 10/28/2018   Pain managed using patient-controlled analgesia (PCA) 10/25/2018   Fall 10/24/2018   Fall from ladder 10/23/2018   DDD (degenerative disc disease), lumbosacral 09/15/2018   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   Central line complication 10/22/2016   Genetic testing 05/15/2016   Family history of breast cancer    Family history of brain cancer    Breast cancer of upper-outer quadrant of left female breast (HCC) 03/20/2016   Transaminitis 10/03/2015   Symptomatic cholelithiasis 10/03/2015   RUQ abdominal pain    Lumbosacral neuritis 11/10/2014   Lumbar radiculopathy 10/19/2014   Depression 06/24/2013   RLS (restless legs syndrome) 06/24/2013   Low back pain 05/19/2013    PCP: Thana Ates MD  REFERRING PROVIDER: Julieanne Cotton, PA-C   REFERRING DIAG: [phone] (ICD-10-CM) - Chronic pain of right knee    THERAPY DIAG:  Chronic pain of right knee  Muscle weakness (generalized)  Stiffness of right knee, not elsewhere classified  Difficulty in walking, not elsewhere classified  Localized edema  Rationale for Evaluation and Treatment: Rehabilitation  ONSET DATE: 09/03/2023 Rt TKA  SUBJECTIVE:   SUBJECTIVE STATEMENT: She has been doing her exercises since PT eval 2 days ago.  She is having issues with standing up from chairs. She is using the CPM but feels pull on lateral knee.  She is using ice for elevation and using recliner for elevation.   PERTINENT HISTORY: breast CA, ACDF, lumbar fusion, history of C2 fracture,   PAIN:  NPRS scale: today 1/10 and since PT eval 0/10 - 5/10 Pain location: Rt knee  Pain description: stiffness, tight, achy Aggravating factors: prolonged positioning, transfers (car transfers), increased WB activity, getting comfortable to sleep.  Relieving factors: pain medicine, ice   PRECAUTIONS: None  WEIGHT BEARING RESTRICTIONS: No  FALLS:  Has patient fallen in  last 6 months? 1 fall day of surgery in bathroom at hospital  No fear of falling reported.   LIVING ENVIRONMENT: Lives in: House/apartment Stairs: 1 step to entry with bilateral rails.  Has following equipment at home: Cane, walker  OCCUPATION: No work  PLOF: Independent, likes to camp in RV trailer, has dogs.  Has silver sneakers  PATIENT GOALS: Reduce pain, get back to walking  OBJECTIVE:   PATIENT SURVEYS:  09/24/2023 FOTO intake: 40   predicted:  53  COGNITION: 09/24/2023 Overall cognitive status: WFL    SENSATION: 09/24/2023 None tested today  EDEMA:  09/24/2023 Localized edema noted visually Rt knee/lower leg  MUSCLE LENGTH: 09/24/2023 None tested today  POSTURE:  09/24/2023 Weight deviation to Lt in standing.   PALPATION: 09/24/2023 General tenderness to touch.   LOWER EXTREMITY ROM:   ROM Right 09/24/2023 Left 09/24/2023  Hip flexion    Hip  extension    Hip abduction    Hip adduction    Hip internal rotation    Hip external rotation    Knee flexion 111 AROM in supine heel slide   Knee extension 0 AROM in sitting LAQ   Ankle dorsiflexion    Ankle plantarflexion    Ankle inversion    Ankle eversion     (Blank rows = not tested)  LOWER EXTREMITY MMT:  MMT Right 09/24/2023 Left 09/24/2023  Hip flexion 5/5 5/5  Hip extension    Hip abduction    Hip adduction    Hip internal rotation    Hip external rotation    Knee flexion 4/5 5/5  Knee extension 4/5 5/5  Ankle dorsiflexion 5/5 5/5  Ankle plantarflexion    Ankle inversion    Ankle eversion     (Blank rows = not tested)  LOWER EXTREMITY SPECIAL TESTS:  09/24/2023 None tested today  FUNCTIONAL TESTS:  09/24/2023 18 inch chair transfer: able to perform on 1st attempt but with increased difficulty Lt SLS:  unable Rt SLS:  unable   GAIT: 09/24/2023 Mod independent c SPC in Lt UE.  Reduced stance on Rt leg with weight shift off Rt.  Reduced toe off progression.                                                                                                                                                                         TODAY'S TREATMENT  DATE: 09/26/2023: Therapeutic Exercise:  SciFit bike seat 10 with BLEs only level 1 for 8 min.  Sit to stand off 24" bar stool 10 reps without UE assist;  PT demo & verbal cues on proper technique - from 18" chair with armrests 10 reps Seated heel slides with foot on pillow case ext / quad set 5 sec and active knee flexion 5 sec hold 10 reps. Seated RLE SLR 10 reps Supine heel slide with strap & ball ext / quad set 5 sec and knee flexion 5 sec hold 15 reps Supine SLR RLE 10 reps  Self-Care: PT educated with demo, verbal & HO cues on elevation with LE higher than heart for >15 min >/= 2x/day and ankle alphabet for small muscle activity. Pt  verbalized understanding.   Vaso 10 mins Rt knee in elevation medium compression 34 deg.    09/24/2023 Therex:    HEP instruction/performance c cues for techniques, handout provided.  Trial set performed of each for comprehension and symptom assessment.  See below for exercise list  Vaso 10 mins Rt knee in elevation medium compression 34 deg.   PATIENT EDUCATION:  09/24/2023 Education details: HEP, POC Person educated: Patient Education method: Programmer, multimedia, Demonstration, Verbal cues, and Handouts Education comprehension: verbalized understanding, returned demonstration, and verbal cues required  HOME EXERCISE PROGRAM: Access Code: [email or web address] URL: [email or web address] Date: 09/26/2023 Prepared by: Vladimir Faster  Exercises - Supine Heel Slide  - 3-5 x daily - 7 x weekly - 1-2 sets - 10 reps - 2 hold - Seated Quad Set  - 3-5 x daily - 7 x weekly - 1 sets - 10 reps - 5 hold - Seated Straight Leg Heel Taps  - 1-2 x daily - 7 x weekly - 3 sets - 10 reps - Seated Long Arc Quad  - 3-5 x daily - 7 x weekly - 1-2 sets - 10 reps - 2 hold - Sit to Stand  - 3 x daily - 7 x weekly - 1 sets - 10 reps - Seated Knee Flexion Extension AROM   - 3 x daily - 7 x weekly - 1-2 sets - 10 reps - 5 seconds hold - Supine Heel Slide with Strap  - 3 x daily - 7 x weekly - 1-2 sets - 10 reps - 5 seconds hold - Active Straight Leg Raise with Quad Set  - 3 x daily - 7 x weekly - 1-2 sets - 10 reps - 5 seconds hold  Patient Education - Leg Elevation for Swelling  ASSESSMENT:  CLINICAL IMPRESSION: Patient reported improved understanding for HEP.  She required brief rests throughout session to minimize spikes in pain.  Patient continues to benefit from skilled PT.  OBJECTIVE IMPAIRMENTS: Abnormal gait, decreased activity tolerance, decreased balance, decreased coordination, decreased endurance, decreased mobility, difficulty walking, decreased ROM, decreased strength, hypomobility, increased  edema, increased fascial restrictions, impaired perceived functional ability, impaired flexibility, improper body mechanics, and pain.   ACTIVITY LIMITATIONS: carrying, lifting, bending, sitting, standing, squatting, sleeping, stairs, transfers, bed mobility, and locomotion level  PARTICIPATION LIMITATIONS: meal prep, cleaning, laundry, interpersonal relationship, driving, shopping, community activity, and yard work  PERSONAL FACTORS:  breast CA, ACDF, lumbar fusion, history of C2 fracture,   are also affecting patient's functional outcome.   REHAB POTENTIAL: Good  CLINICAL DECISION MAKING: Stable/uncomplicated  EVALUATION COMPLEXITY: Low   GOALS: Goals reviewed with patient? Yes  SHORT TERM GOALS: (target date for Short term goals are 3 weeks 10/15/2023)   1.  Patient  will demonstrate independent use of home exercise program to maintain progress from in clinic treatments.  Goal status: New  LONG TERM GOALS: (target dates for all long term goals are 12 weeks  12/17/2023 )   1. Patient will demonstrate/report pain at worst less than or equal to 2/10 to facilitate minimal limitation in daily activity secondary to pain symptoms.  Goal status: New   2. Patient will demonstrate independent use of home exercise program to facilitate ability to maintain/progress functional gains from skilled physical therapy services.  Goal status: New   3. Patient will demonstrate FOTO outcome > or = 53 % to indicate reduced disability due to condition.  Goal status: New   4.  Patient will demonstrate Rt LE MMT 5/5 throughout to faciltiate usual transfers, stairs, squatting at [facility] for daily life.   Goal status: New   5.  Patient will demonstrate Rt knee AROM 0-120 deg to facilitate sitting, transfers, ambulation s deviation due to range.  Goal status: New   6.  Patient will demonstrate ascending/descending stairs reciprocally s UE assist for community integration.   Goal status: New   7.   Patient will demonstrate independent ambulation community distances > 300 ft for community integration.  Goal Status: New   PLAN:  PT FREQUENCY: 1-2x/week  PT DURATION: 12 weeks  PLANNED INTERVENTIONS: Can include 72536- PT Re-evaluation, 97110-Therapeutic exercises, 97530- Therapeutic activity, [id number]- Neuromuscular re-education, 97535- Self Care, [id number]- Manual therapy, [phone]- Gait training, [phone]- Orthotic Fit/training, [phone]- Canalith repositioning, [id number]- Aquatic Therapy, 97014- Electrical stimulation (unattended), [id number]- Electrical stimulation (manual), [id number]- Vasopneumatic device, [id number]- Ultrasound, [id number]- Traction (mechanical), [id number]- Ionotophoresis 4mg /ml Dexamethasone, Patient/Family education, Balance training, Stair training, Taping, Dry Needling, Joint mobilization, Joint manipulation, Spinal manipulation, Spinal mobilization, Scar mobilization, Vestibular training, Visual/preceptual remediation/compensation, DME instructions, Cryotherapy, and Moist heat.  All performed as medically necessary.  All included unless contraindicated  PLAN FOR NEXT SESSION:   manual therapy & therapeutic exercises progressing towards function.  Vaso for edema.     Vladimir Faster, PT, DPT 09/26/2023, 2:22 PM

## 2023-10-01 ENCOUNTER — Telehealth: Payer: Self-pay | Admitting: *Deleted

## 2023-10-01 ENCOUNTER — Ambulatory Visit (INDEPENDENT_AMBULATORY_CARE_PROVIDER_SITE_OTHER): Payer: Medicare PPO | Admitting: Physical Therapy

## 2023-10-01 ENCOUNTER — Encounter: Payer: Self-pay | Admitting: Physical Therapy

## 2023-10-01 DIAGNOSIS — M6281 Muscle weakness (generalized): Secondary | ICD-10-CM

## 2023-10-01 DIAGNOSIS — M25661 Stiffness of right knee, not elsewhere classified: Secondary | ICD-10-CM | POA: Diagnosis not present

## 2023-10-01 DIAGNOSIS — R600 Localized edema: Secondary | ICD-10-CM

## 2023-10-01 DIAGNOSIS — G8929 Other chronic pain: Secondary | ICD-10-CM | POA: Diagnosis not present

## 2023-10-01 DIAGNOSIS — M25561 Pain in right knee: Secondary | ICD-10-CM | POA: Diagnosis not present

## 2023-10-01 DIAGNOSIS — R262 Difficulty in walking, not elsewhere classified: Secondary | ICD-10-CM

## 2023-10-01 DIAGNOSIS — M1711 Unilateral primary osteoarthritis, right knee: Secondary | ICD-10-CM

## 2023-10-01 NOTE — Telephone Encounter (Signed)
No problem.

## 2023-10-01 NOTE — Therapy (Addendum)
OUTPATIENT PHYSICAL THERAPY  TREATMENT   Patient Name: Alexis Fox MRN: [id number] DOB:[birth year], 67 y.o., female Today's Date: 10/01/2023  END OF SESSION:  PT End of Session - 10/01/23 1023     Visit Number 3    Number of Visits 24    Date for PT Re-Evaluation 12/17/23    Authorization Type [employer] $20 copay    Authorization Time Period $20 COPAYApproved  Authorization #[id number]  Tracking #[id number]  09/24/2023-12/17/2023    Authorization - Visit Number 3    Progress Note Due on Visit 10    PT Start Time 1015    PT Stop Time 1115    PT Time Calculation (min) 60 min    Activity Tolerance Patient tolerated treatment well;Patient limited by pain    Behavior During Therapy [facility] for tasks assessed/performed               Past Medical History:  Diagnosis Date   Anxiety    Arthritis    knees   Breast cancer (HCC)    Breast cancer of upper-outer quadrant of left female breast (HCC) 03/20/2016   C2 cervical fracture (HCC)    10/22/18, following fall from ladder   Cervicalgia    Cholecystitis    Complication of anesthesia    BP "crashes" post op   Early cataracts, bilateral    Family history of brain cancer    Family history of breast cancer    Hot flashes    Personal history of chemotherapy    Personal history of radiation therapy    Restless leg syndrome    Sleep apnea 10/03/2018   wears CPAP   Past Surgical History:  Procedure Laterality Date   ANTERIOR CERVICAL DECOMP/DISCECTOMY FUSION N/A 01/22/2020   Procedure: ACDF - C2-C3;  Surgeon: Tia Alert, MD;  Location: [facility] OR;  Service: Neurosurgery;  Laterality: N/A;   BACK SURGERY     x2   BREAST BIOPSY     BREAST LUMPECTOMY Left 2018   BUNIONECTOMY     CHOLECYSTECTOMY N/A 10/04/2015   Procedure: LAPAROSCOPIC CHOLECYSTECTOMY WITH INTRAOPERATIVE CHOLANGIOGRAM;  Surgeon: Gaynelle Adu, MD;  Location: [facility] OR;  Service: General;  Laterality: N/A;   ELBOW SURGERY     right for epicondylitis 2010     ESOPHAGEAL MANOMETRY N/A 11/30/2020   Procedure: ESOPHAGEAL MANOMETRY (EM);  Surgeon: Kerin Salen, MD;  Location: [facility];  Service: Gastroenterology;  Laterality: N/A;   FOOT SURGERY     1983 -tarsal tunnel release   IR GENERIC HISTORICAL  10/26/2016   IR CV LINE INJECTION 10/26/2016 [facility]   LUMBAR DISC SURGERY  04/04/2012   MASS EXCISION Left 02/06/2021   Procedure: EXCISION LEFT FOOT SOFT TISSUE BETWEEN SECOND AND THIRD AND THIRD AND FOURTH TOES;  Surgeon: Asencion Islam, DPM;  Location: [facility];  Service: Podiatry;  Laterality: Left;   PORT-A-CATH REMOVAL N/A 11/15/2016   Procedure: REMOVAL PORT-A-CATH;  Surgeon: Emelia Loron, MD;  Location: [facility];  Service: General;  Laterality: N/A;   PORTACATH PLACEMENT Right 04/02/2016   Procedure: INSERTION PORT-A-CATH WITH [place];  Surgeon: Emelia Loron, MD;  Location: [facility];  Service: General;  Laterality: Right;   RADIOACTIVE SEED GUIDED PARTIAL MASTECTOMY/AXILLARY SENTINEL NODE BIOPSY/AXILLARY NODE DISSECTION Left 09/12/2016   Procedure: LEFT BREAST SEED GUIDED LUMPECTOMY, LEFT AXILLARY SENTINEL NODE BIOPSY, LEFT SEED GUIDED AXILLARY NODE EXCISION( TARGETED AXILLARY DISSECTION), BLUE DYE INJECTION;  Surgeon: Emelia Loron, MD;  Location: [facility];  Service: General;  Laterality: Left;   SACROILIAC JOINT FUSION Left 05/18/2020   Procedure: LEFT SACROILIAC JOINT FUSION;  Surgeon: Estill Bamberg, MD;  Location: [facility] OR;  Service: Orthopedics;  Laterality: Left;   SPINAL FUSION  5/12   L5-S1   SPINAL FUSION  05/18/2019   L4-5   TARSAL TUNNEL RELEASE     TOTAL KNEE ARTHROPLASTY Right 09/03/2023   Procedure: RIGHT TOTAL KNEE ARTHROPLASTY;  Surgeon: Cammy Copa, MD;  Location: [facility] OR;  Service: Orthopedics;  Laterality: Right;   Patient Active Problem List   Diagnosis Date Noted   DJD (degenerative joint disease) of knee 09/03/2023   S/P total knee  arthroplasty, right 09/03/2023   Unilateral primary osteoarthritis, right knee 12/28/2022   Pain in left ankle and joints of left foot 09/13/2022   Hypersomnia, persistent 08/21/2022   Excessive daytime sleepiness 08/21/2022   MCI (mild cognitive impairment) 08/21/2022   History of breast cancer 08/21/2022   OSA on CPAP 08/21/2022   Pain in left shoulder 03/01/2021   Allergic rhinitis 02/21/2021   Anxiety 02/21/2021   Contact dermatitis due to plant 02/21/2021   Dysphagia 02/21/2021   Estrogen receptor negative status (ER-) 02/21/2021   Gastroesophageal reflux disease 02/21/2021   Gastroparesis 02/21/2021   Insomnia 02/21/2021   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   Morton's neuroma of left foot 02/21/2021   Personal history of malignant neoplasm of breast 02/21/2021   Vitamin D deficiency 02/21/2021   Oth nondisplaced dens fracture, init for clos fx (HCC) 02/04/2020   S/P cervical spinal fusion 01/22/2020   Memory deficit 01/14/2020   Sacroiliac dysfunction 01/14/2020   Neck pain 11/05/2019   Body mass index (BMI) 25.0-25.9, adult 11/05/2019   Unilateral primary osteoarthritis, right hip 09/01/2019   Trochanteric bursitis, left hip 07/14/2019   S/P lumbar fusion 05/18/2019   OSA (obstructive sleep apnea) 10/28/2018   Pain managed using patient-controlled analgesia (PCA) 10/25/2018   Fall 10/24/2018   Fall from ladder 10/23/2018   DDD (degenerative disc disease), lumbosacral 09/15/2018   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   Central line complication 10/22/2016   Genetic testing 05/15/2016   Family history of breast cancer    Family history of brain cancer    Breast cancer of upper-outer quadrant of left female breast (HCC) 03/20/2016   Transaminitis 10/03/2015   Symptomatic cholelithiasis 10/03/2015   RUQ abdominal pain    Lumbosacral neuritis 11/10/2014   Lumbar radiculopathy 10/19/2014   Depression 06/24/2013   RLS (restless legs  syndrome) 06/24/2013   Low back pain 05/19/2013    PCP: Thana Ates MD  REFERRING PROVIDER: Julieanne Cotton, PA-C   REFERRING DIAG: [phone] (ICD-10-CM) - Chronic pain of right knee   THERAPY DIAG:  Chronic pain of right knee  Muscle weakness (generalized)  Stiffness of right knee, not elsewhere classified  Difficulty in walking, not elsewhere classified  Localized edema  Rationale for Evaluation and Treatment: Rehabilitation  ONSET DATE: 09/03/2023 Rt TKA  SUBJECTIVE:   SUBJECTIVE STATEMENT: Last night she was working on bending knee pulling to chest in her recliner.  She strained the quad muscle on right leg and has pain with all quad activities or stretch to quad muscle.   PERTINENT HISTORY: breast CA, ACDF, lumbar fusion, history of C2 fracture,   PAIN:  NPRS scale: today knee resting / no motion 2/10, moving LE when supine 4/10 and since last night worst 8/10 Pain location: Rt knee  Pain description: stiffness, tight, achy Aggravating  factors: prolonged positioning, transfers (car transfers), increased WB activity, getting comfortable to sleep.  Relieving factors: pain medicine, ice   PRECAUTIONS: None  WEIGHT BEARING RESTRICTIONS: No  FALLS:  Has patient fallen in last 6 months? 1 fall day of surgery in bathroom at hospital  No fear of falling reported.   LIVING ENVIRONMENT: Lives in: House/apartment Stairs: 1 step to entry with bilateral rails.  Has following equipment at home: Cane, walker  OCCUPATION: No work  PLOF: Independent, likes to camp in RV trailer, has dogs.  Has silver sneakers  PATIENT GOALS: Reduce pain, get back to walking  OBJECTIVE:   PATIENT SURVEYS:  09/24/2023 FOTO intake: 40   predicted:  53  COGNITION: 09/24/2023 Overall cognitive status: WFL    SENSATION: 09/24/2023 None tested today  EDEMA:  09/24/2023 Localized edema noted visually Rt knee/lower leg  MUSCLE LENGTH: 09/24/2023 None tested  today  POSTURE:  09/24/2023 Weight deviation to Lt in standing.   PALPATION: 10/01/2023:  tenderness to palpation quad tendon and vastus medialis just proximal to tendon.  Pain lateral knee with flexion motions.  Pt able to engage quads with some pain but no divot or indention in quad muscle felt.   09/24/2023 General tenderness to touch.   LOWER EXTREMITY ROM:   ROM Right 09/24/2023 Left 09/24/2023  Hip flexion    Hip extension    Hip abduction    Hip adduction    Hip internal rotation    Hip external rotation    Knee flexion 111 AROM in supine heel slide   Knee extension 0 AROM in sitting LAQ   Ankle dorsiflexion    Ankle plantarflexion    Ankle inversion    Ankle eversion     (Blank rows = not tested)  LOWER EXTREMITY MMT:  MMT Right 09/24/2023 Left 09/24/2023  Hip flexion 5/5 5/5  Hip extension    Hip abduction    Hip adduction    Hip internal rotation    Hip external rotation    Knee flexion 4/5 5/5  Knee extension 4/5 5/5  Ankle dorsiflexion 5/5 5/5  Ankle plantarflexion    Ankle inversion    Ankle eversion     (Blank rows = not tested)  LOWER EXTREMITY SPECIAL TESTS:  09/24/2023 None tested today  FUNCTIONAL TESTS:  09/24/2023 18 inch chair transfer: able to perform on 1st attempt but with increased difficulty Lt SLS:  unable Rt SLS:  unable   GAIT: 09/24/2023 Mod independent c SPC in Lt UE.  Reduced stance on Rt leg with weight shift off Rt.  Reduced toe off progression.  TODAY'S TREATMENT                                                                          DATE: 10/01/2023: Therapeutic Exercise:  SAQ with Yoga block under heel so 20* ROM which was in her least painful arc 10 reps with 5 sec rest bw reps. Heel slides with BLEs on red Therapy ball with strap assistance 15  reps 2 sets.  Manual Therapy: See objective data for pain / palpation. Ralph Dowdy case manager came to PT session per request.  We will monitor pain response thru next PT appt that is 2 days away.  Sheri will check with [place], [place] & Dr. August Saucer also. PROM right knee with pt supine  Vaso to right knee 34* medium compression with elevation for 15 min.     09/26/2023: Therapeutic Exercise:  SciFit bike seat 10 with BLEs only level 1 for 8 min.  Sit to stand off 24" bar stool 10 reps without UE assist;  PT demo & verbal cues on proper technique - from 18" chair with armrests 10 reps Seated heel slides with foot on pillow case ext / quad set 5 sec and active knee flexion 5 sec hold 10 reps. Seated RLE SLR 10 reps Supine heel slide with strap & ball ext / quad set 5 sec and knee flexion 5 sec hold 15 reps Supine SLR RLE 10 reps  Self-Care: PT educated with demo, verbal & HO cues on elevation with LE higher than heart for >15 min >/= 2x/day and ankle alphabet for small muscle activity. Pt verbalized understanding.   Vaso 10 mins Rt knee in elevation medium compression 34 deg.    09/24/2023 Therex:    HEP instruction/performance c cues for techniques, handout provided.  Trial set performed of each for comprehension and symptom assessment.  See below for exercise list  Vaso 10 mins Rt knee in elevation medium compression 34 deg.   PATIENT EDUCATION:  09/24/2023 Education details: HEP, POC Person educated: Patient Education method: Programmer, multimedia, Demonstration, Verbal cues, and Handouts Education comprehension: verbalized understanding, returned demonstration, and verbal cues required  HOME EXERCISE PROGRAM: Access Code: [email or web address] URL: [email or web address] Date: 09/26/2023 Prepared by: Vladimir Faster  Exercises - Supine Heel Slide  - 3-5 x daily - 7 x weekly - 1-2 sets - 10 reps - 2 hold - Seated Quad Set  - 3-5 x daily - 7 x weekly - 1 sets - 10 reps - 5 hold - Seated  Straight Leg Heel Taps  - 1-2 x daily - 7 x weekly - 3 sets - 10 reps - Seated Long Arc Quad  - 3-5 x daily - 7 x weekly - 1-2 sets - 10 reps - 2 hold - Sit to Stand  - 3 x daily - 7 x weekly - 1 sets - 10 reps - Seated Knee Flexion Extension AROM   - 3 x daily - 7 x weekly - 1-2 sets - 10 reps - 5 seconds hold - Supine Heel Slide with Strap  - 3 x daily - 7 x weekly - 1-2 sets - 10 reps - 5 seconds hold - Active Straight Leg Raise with Quad Set  - 3 x daily -  7 x weekly - 1-2 sets - 10 reps - 5 seconds hold  Patient Education - Leg Elevation for Swelling  ASSESSMENT:  CLINICAL IMPRESSION: Patient has increased pain. She does not appear to have a muscle tear.  PT & nurse case manager suspect that patient accidentally did a self-manipulation to scar tissue with resulting increase in knee pain.   Patient continues to benefit from skilled PT.  OBJECTIVE IMPAIRMENTS: Abnormal gait, decreased activity tolerance, decreased balance, decreased coordination, decreased endurance, decreased mobility, difficulty walking, decreased ROM, decreased strength, hypomobility, increased edema, increased fascial restrictions, impaired perceived functional ability, impaired flexibility, improper body mechanics, and pain.   ACTIVITY LIMITATIONS: carrying, lifting, bending, sitting, standing, squatting, sleeping, stairs, transfers, bed mobility, and locomotion level  PARTICIPATION LIMITATIONS: meal prep, cleaning, laundry, interpersonal relationship, driving, shopping, community activity, and yard work  PERSONAL FACTORS:  breast CA, ACDF, lumbar fusion, history of C2 fracture,   are also affecting patient's functional outcome.   REHAB POTENTIAL: Good  CLINICAL DECISION MAKING: Stable/uncomplicated  EVALUATION COMPLEXITY: Low   GOALS: Goals reviewed with patient? Yes  SHORT TERM GOALS: (target date for Short term goals are 3 weeks 10/15/2023)   1.  Patient will demonstrate independent use of home exercise  program to maintain progress from in clinic treatments.  Goal status: Ongoing 10/01/2023  LONG TERM GOALS: (target dates for all long term goals are 12 weeks  12/17/2023 )   1. Patient will demonstrate/report pain at worst less than or equal to 2/10 to facilitate minimal limitation in daily activity secondary to pain symptoms.  Goal status: Ongoing 10/01/2023   2. Patient will demonstrate independent use of home exercise program to facilitate ability to maintain/progress functional gains from skilled physical therapy services.  Goal status: Ongoing 10/01/2023   3. Patient will demonstrate FOTO outcome > or = 53 % to indicate reduced disability due to condition.  Goal status: Ongoing 10/01/2023   4.  Patient will demonstrate Rt LE MMT 5/5 throughout to faciltiate usual transfers, stairs, squatting at [facility] for daily life.   Goal status: Ongoing 10/01/2023   5.  Patient will demonstrate Rt knee AROM 0-120 deg to facilitate sitting, transfers, ambulation s deviation due to range.  Goal status: Ongoing 10/01/2023   6.  Patient will demonstrate ascending/descending stairs reciprocally s UE assist for community integration.   Goal status: Ongoing 10/01/2023   7.  Patient will demonstrate independent ambulation community distances > 300 ft for community integration.  Goal Status: Ongoing 10/01/2023   PLAN:  PT FREQUENCY: 1-2x/week  PT DURATION: 12 weeks  PLANNED INTERVENTIONS: Can include 82993- PT Re-evaluation, 97110-Therapeutic exercises, 97530- Therapeutic activity, 97112- Neuromuscular re-education, 97535- Self Care, 97140- Manual therapy, [phone]- Gait training, [phone]- Orthotic Fit/training, [phone]- Canalith repositioning, [id number]- Aquatic Therapy, 97014- Electrical stimulation (unattended), [id number]- Electrical stimulation (manual), [id number]- Vasopneumatic device, [id number]- Ultrasound, [id number]- Traction (mechanical), [id number]- Ionotophoresis 4mg /ml Dexamethasone, Patient/Family education, Balance  training, Stair training, Taping, Dry Needling, Joint mobilization, Joint manipulation, Spinal manipulation, Spinal mobilization, Scar mobilization, Vestibular training, Visual/preceptual remediation/compensation, DME instructions, Cryotherapy, and Moist heat.  All performed as medically necessary.  All included unless contraindicated  PLAN FOR NEXT SESSION:  assess pain & range, if pain continues to be significantly increased, contact MD,  manual therapy & therapeutic exercises progressing towards function.  Vaso for edema.     Vladimir Faster, PT, DPT 10/01/2023, 1:33 PM

## 2023-10-01 NOTE — Telephone Encounter (Signed)
I got requested to come see patient in therapy today. She is approximately 30 days out from her Right total knee and has been really making progress. Pushed down in her recliner yesterday and felt a sudden pain laterally and into the quad. Therapy feels she may have broken up some scar tissue and didn't feel that there was a tear of the quad, but now she can hardly lift her leg using quad strength to get into bed or ambulate. She comes back in for therapy Thursday and I discussed with Lauren. If she could be seen by you to evaluate Thursday after therapy to make sure she doesn't need to change anything, that would be great. Thank you.Therapy Thursday finishes at 12:30 pm. Thank you.

## 2023-10-01 NOTE — Telephone Encounter (Signed)
I can evaluate her on Thursday, no worries

## 2023-10-02 ENCOUNTER — Other Ambulatory Visit: Payer: Self-pay | Admitting: Orthopedic Surgery

## 2023-10-03 ENCOUNTER — Ambulatory Visit (INDEPENDENT_AMBULATORY_CARE_PROVIDER_SITE_OTHER): Payer: Medicare PPO | Admitting: Physical Therapy

## 2023-10-03 ENCOUNTER — Encounter: Payer: Self-pay | Admitting: Physical Therapy

## 2023-10-03 ENCOUNTER — Encounter: Payer: Self-pay | Admitting: Surgical

## 2023-10-03 ENCOUNTER — Ambulatory Visit: Payer: Medicare PPO | Admitting: Surgical

## 2023-10-03 ENCOUNTER — Other Ambulatory Visit (INDEPENDENT_AMBULATORY_CARE_PROVIDER_SITE_OTHER): Payer: Medicare PPO

## 2023-10-03 DIAGNOSIS — Z6836 Body mass index (BMI) 36.0-36.9, adult: Secondary | ICD-10-CM | POA: Diagnosis not present

## 2023-10-03 DIAGNOSIS — M6281 Muscle weakness (generalized): Secondary | ICD-10-CM

## 2023-10-03 DIAGNOSIS — Z96651 Presence of right artificial knee joint: Secondary | ICD-10-CM

## 2023-10-03 DIAGNOSIS — M25561 Pain in right knee: Secondary | ICD-10-CM

## 2023-10-03 DIAGNOSIS — M25661 Stiffness of right knee, not elsewhere classified: Secondary | ICD-10-CM

## 2023-10-03 DIAGNOSIS — R262 Difficulty in walking, not elsewhere classified: Secondary | ICD-10-CM | POA: Diagnosis not present

## 2023-10-03 DIAGNOSIS — G8929 Other chronic pain: Secondary | ICD-10-CM | POA: Diagnosis not present

## 2023-10-03 DIAGNOSIS — M4310 Spondylolisthesis, site unspecified: Secondary | ICD-10-CM | POA: Diagnosis not present

## 2023-10-03 DIAGNOSIS — R600 Localized edema: Secondary | ICD-10-CM

## 2023-10-03 NOTE — Therapy (Signed)
OUTPATIENT PHYSICAL THERAPY  TREATMENT   Patient Name: Graysen Jarosh MRN: [id number] DOB:[birth year], 67 y.o., female Today's Date: 10/03/2023  END OF SESSION:  PT End of Session - 10/03/23 1139     Visit Number 4    Number of Visits 24    Date for PT Re-Evaluation 12/17/23    Authorization Type [employer] $20 copay    Authorization Time Period $20 COPAYApproved  Authorization #[id number]  Tracking #[id number]  09/24/2023-12/17/2023    Progress Note Due on Visit 10    PT Start Time 1139    PT Stop Time 1240    PT Time Calculation (min) 61 min    Activity Tolerance Patient tolerated treatment well;Patient limited by pain    Behavior During Therapy [facility] for tasks assessed/performed                Past Medical History:  Diagnosis Date   Anxiety    Arthritis    knees   Breast cancer (HCC)    Breast cancer of upper-outer quadrant of left female breast (HCC) 03/20/2016   C2 cervical fracture (HCC)    10/22/18, following fall from ladder   Cervicalgia    Cholecystitis    Complication of anesthesia    BP "crashes" post op   Early cataracts, bilateral    Family history of brain cancer    Family history of breast cancer    Hot flashes    Personal history of chemotherapy    Personal history of radiation therapy    Restless leg syndrome    Sleep apnea 10/03/2018   wears CPAP   Past Surgical History:  Procedure Laterality Date   ANTERIOR CERVICAL DECOMP/DISCECTOMY FUSION N/A 01/22/2020   Procedure: ACDF - C2-C3;  Surgeon: Tia Alert, MD;  Location: [facility] OR;  Service: Neurosurgery;  Laterality: N/A;   BACK SURGERY     x2   BREAST BIOPSY     BREAST LUMPECTOMY Left 2018   BUNIONECTOMY     CHOLECYSTECTOMY N/A 10/04/2015   Procedure: LAPAROSCOPIC CHOLECYSTECTOMY WITH INTRAOPERATIVE CHOLANGIOGRAM;  Surgeon: Gaynelle Adu, MD;  Location: [facility] OR;  Service: General;  Laterality: N/A;   ELBOW SURGERY     right for epicondylitis 2010    ESOPHAGEAL MANOMETRY N/A  11/30/2020   Procedure: ESOPHAGEAL MANOMETRY (EM);  Surgeon: Kerin Salen, MD;  Location: [facility];  Service: Gastroenterology;  Laterality: N/A;   FOOT SURGERY     1983 -tarsal tunnel release   IR GENERIC HISTORICAL  10/26/2016   IR CV LINE INJECTION 10/26/2016 [facility]-INTER[facility] RAD   LUMBAR DISC SURGERY  04/04/2012   MASS EXCISION Left 02/06/2021   Procedure: EXCISION LEFT FOOT SOFT TISSUE BETWEEN SECOND AND THIRD AND THIRD AND FOURTH TOES;  Surgeon: Asencion Islam, DPM;  Location: [facility];  Service: Podiatry;  Laterality: Left;   PORT-A-CATH REMOVAL N/A 11/15/2016   Procedure: REMOVAL PORT-A-CATH;  Surgeon: Emelia Loron, MD;  Location: [facility];  Service: General;  Laterality: N/A;   PORTACATH PLACEMENT Right 04/02/2016   Procedure: INSERTION PORT-A-CATH WITH [place];  Surgeon: Emelia Loron, MD;  Location: [facility];  Service: General;  Laterality: Right;   RADIOACTIVE SEED GUIDED PARTIAL MASTECTOMY/AXILLARY SENTINEL NODE BIOPSY/AXILLARY NODE DISSECTION Left 09/12/2016   Procedure: LEFT BREAST SEED GUIDED LUMPECTOMY, LEFT AXILLARY SENTINEL NODE BIOPSY, LEFT SEED GUIDED AXILLARY NODE EXCISION( TARGETED AXILLARY DISSECTION), BLUE DYE INJECTION;  Surgeon: Emelia Loron, MD;  Location: [facility];  Service: General;  Laterality: Left;  SACROILIAC JOINT FUSION Left 05/18/2020   Procedure: LEFT SACROILIAC JOINT FUSION;  Surgeon: Estill Bamberg, MD;  Location: [facility] OR;  Service: Orthopedics;  Laterality: Left;   SPINAL FUSION  5/12   L5-S1   SPINAL FUSION  05/18/2019   L4-5   TARSAL TUNNEL RELEASE     TOTAL KNEE ARTHROPLASTY Right 09/03/2023   Procedure: RIGHT TOTAL KNEE ARTHROPLASTY;  Surgeon: Cammy Copa, MD;  Location: [facility] OR;  Service: Orthopedics;  Laterality: Right;   Patient Active Problem List   Diagnosis Date Noted   Arthritis of knee, right 10/01/2023   DJD (degenerative joint disease) of knee 09/03/2023   S/P  total knee arthroplasty, right 09/03/2023   Unilateral primary osteoarthritis, right knee 12/28/2022   Pain in left ankle and joints of left foot 09/13/2022   Hypersomnia, persistent 08/21/2022   Excessive daytime sleepiness 08/21/2022   MCI (mild cognitive impairment) 08/21/2022   History of breast cancer 08/21/2022   OSA on CPAP 08/21/2022   Pain in left shoulder 03/01/2021   Allergic rhinitis 02/21/2021   Anxiety 02/21/2021   Contact dermatitis due to plant 02/21/2021   Dysphagia 02/21/2021   Estrogen receptor negative status (ER-) 02/21/2021   Gastroesophageal reflux disease 02/21/2021   Gastroparesis 02/21/2021   Insomnia 02/21/2021   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   Morton's neuroma of left foot 02/21/2021   Personal history of malignant neoplasm of breast 02/21/2021   Vitamin D deficiency 02/21/2021   Oth nondisplaced dens fracture, init for clos fx (HCC) 02/04/2020   S/P cervical spinal fusion 01/22/2020   Memory deficit 01/14/2020   Sacroiliac dysfunction 01/14/2020   Neck pain 11/05/2019   Body mass index (BMI) 25.0-25.9, adult 11/05/2019   Unilateral primary osteoarthritis, right hip 09/01/2019   Trochanteric bursitis, left hip 07/14/2019   S/P lumbar fusion 05/18/2019   OSA (obstructive sleep apnea) 10/28/2018   Pain managed using patient-controlled analgesia (PCA) 10/25/2018   Fall 10/24/2018   Fall from ladder 10/23/2018   DDD (degenerative disc disease), lumbosacral 09/15/2018   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   Central line complication 10/22/2016   Genetic testing 05/15/2016   Family history of breast cancer    Family history of brain cancer    Breast cancer of upper-outer quadrant of left female breast (HCC) 03/20/2016   Transaminitis 10/03/2015   Symptomatic cholelithiasis 10/03/2015   RUQ abdominal pain    Lumbosacral neuritis 11/10/2014   Lumbar radiculopathy 10/19/2014   Depression 06/24/2013   RLS  (restless legs syndrome) 06/24/2013   Low back pain 05/19/2013    PCP: Thana Ates MD  REFERRING PROVIDER: Julieanne Cotton, PA-C   REFERRING DIAG: [id number] (ICD-10-CM) - Chronic pain of right knee   THERAPY DIAG:  Chronic pain of right knee  Muscle weakness (generalized)  Stiffness of right knee, not elsewhere classified  Difficulty in walking, not elsewhere classified  Localized edema  Rationale for Evaluation and Treatment: Rehabilitation  ONSET DATE: 09/03/2023 Rt TKA  SUBJECTIVE:   SUBJECTIVE STATEMENT: The knee is better but still really sore.  She is exercised moving flex to ext supine with ball & strap very slow deliberate motions.  She felt the lateral knee area with strong stretch.   PERTINENT HISTORY: breast CA, ACDF, lumbar fusion, history of C2 fracture,   PAIN:  NPRS scale: today knee resting / no motion  1/10, moving LE 3/10 - 6/10 and over last 3 days worst 8/10 with twisting motion "quad was screaming." Pain location: Rt  knee  Pain description: stiffness, tight, achy Aggravating factors: prolonged positioning, transfers (car transfers), increased WB activity, getting comfortable to sleep.  Relieving factors: pain medicine, ice   PRECAUTIONS: None  WEIGHT BEARING RESTRICTIONS: No  FALLS:  Has patient fallen in last 6 months? 1 fall day of surgery in bathroom at hospital  No fear of falling reported.   LIVING ENVIRONMENT: Lives in: House/apartment Stairs: 1 step to entry with bilateral rails.  Has following equipment at home: Cane, walker  OCCUPATION: No work  PLOF: Independent, likes to camp in RV trailer, has dogs.  Has silver sneakers  PATIENT GOALS: Reduce pain, get back to walking  OBJECTIVE:   PATIENT SURVEYS:  09/24/2023 FOTO intake: 40   predicted:  53  COGNITION: 09/24/2023 Overall cognitive status: WFL    SENSATION: 09/24/2023 None tested today  EDEMA:  09/24/2023 Localized edema noted visually Rt knee/lower  leg  MUSCLE LENGTH: 09/24/2023 None tested today  POSTURE:  09/24/2023 Weight deviation to Lt in standing.   PALPATION: 10/01/2023:  tenderness to palpation quad tendon and vastus medialis just proximal to tendon.  Pain lateral knee with flexion motions.  Pt able to engage quads with some pain but no divot or indention in quad muscle felt.   09/24/2023 General tenderness to touch.   LOWER EXTREMITY ROM:   ROM Right 09/24/2023 Right 10/03/2023  Hip flexion    Hip extension    Hip abduction    Hip adduction    Hip internal rotation    Hip external rotation    Knee flexion 111 AROM in supine heel slide Seated heel slide A: 94* P: 99* Supine AA: 104* Strap/ball   Knee extension 0 AROM in sitting LAQ Seated heel slide A: 0*  Ankle dorsiflexion    Ankle plantarflexion    Ankle inversion    Ankle eversion     (Blank rows = not tested)  LOWER EXTREMITY MMT:  MMT Right 09/24/2023 Left 09/24/2023  Hip flexion 5/5 5/5  Hip extension    Hip abduction    Hip adduction    Hip internal rotation    Hip external rotation    Knee flexion 4/5 5/5  Knee extension 4/5 5/5  Ankle dorsiflexion 5/5 5/5  Ankle plantarflexion    Ankle inversion    Ankle eversion     (Blank rows = not tested)  LOWER EXTREMITY SPECIAL TESTS:  09/24/2023 None tested today  FUNCTIONAL TESTS:  09/24/2023 18 inch chair transfer: able to perform on 1st attempt but with increased difficulty Lt SLS:  unable Rt SLS:  unable   GAIT: 09/24/2023 Mod independent c SPC in Lt UE.  Reduced stance on Rt leg with weight shift off Rt.  Reduced toe off progression.  TODAY'S TREATMENT                                                                          DATE: 10/03/2023: Therapeutic Exercise:  SciFit bike seat 10 with BLEs & BUEs level  1 for 8 min.  Heel slide on pillow case (reduce resistance) ext / quad set 5 sec and active knee flexion with end range stretch assisted LLE / hamstring set 5 sec for 10 reps.  Slow controlled motions.   Supine SAQ with 40* arc tolerated today 10 reps with 2 sec rest bw reps to relax the quads. Heel slides with RLE on 18" ball with strap assistance 15 reps  Manual Therapy: PROM with overpressure for flexion & extension Ice massage to lateral knee, distal quad, quad tendon & incision line.   Vaso to right knee 34* medium compression with elevation for 10 min.   10/01/2023: Therapeutic Exercise:  SAQ with Yoga block under heel so 20* ROM which was in her least painful arc 10 reps with 5 sec rest bw reps. Heel slides with BLEs on red Therapy ball with strap assistance 15 reps 2 sets.  Manual Therapy: See objective data for pain / palpation. Ralph Dowdy case manager came to PT session per request.  We will monitor pain response thru next PT appt that is 2 days away.  Sheri will check with [place], [place] & Dr. August Saucer also. PROM right knee with pt supine  Vaso to right knee 34* medium compression with elevation for 15 min.     09/26/2023: Therapeutic Exercise:  SciFit bike seat 10 with BLEs only level 1 for 8 min.  Sit to stand off 24" bar stool 10 reps without UE assist;  PT demo & verbal cues on proper technique - from 18" chair with armrests 10 reps Seated heel slides with foot on pillow case ext / quad set 5 sec and active knee flexion 5 sec hold 10 reps. Seated RLE SLR 10 reps Supine heel slide with strap & ball ext / quad set 5 sec and knee flexion 5 sec hold 15 reps Supine SLR RLE 10 reps  Self-Care: PT educated with demo, verbal & HO cues on elevation with LE higher than heart for >15 min >/= 2x/day and ankle alphabet for small muscle activity. Pt verbalized understanding.   Vaso 10 mins Rt knee in elevation medium compression 34 deg.    09/24/2023 Therex:    HEP  instruction/performance c cues for techniques, handout provided.  Trial set performed of each for comprehension and symptom assessment.  See below for exercise list  Vaso 10 mins Rt knee in elevation medium compression 34 deg.   PATIENT EDUCATION:  09/24/2023 Education details: HEP, POC Person educated: Patient Education method: Programmer, multimedia, Demonstration, Verbal cues, and Handouts Education comprehension: verbalized understanding, returned demonstration, and verbal cues required  HOME EXERCISE PROGRAM: Access Code: [email or web address] URL: [email or web address] Date: 09/26/2023 Prepared by: Vladimir Faster  Exercises - Supine Heel Slide  - 3-5 x daily - 7 x weekly - 1-2 sets - 10 reps - 2 hold - Seated Quad Set  - 3-5 x daily - 7 x weekly - 1 sets - 10 reps - 5 hold - Seated Straight Leg  Heel Taps  - 1-2 x daily - 7 x weekly - 3 sets - 10 reps - Seated Long Arc Quad  - 3-5 x daily - 7 x weekly - 1-2 sets - 10 reps - 2 hold - Sit to Stand  - 3 x daily - 7 x weekly - 1 sets - 10 reps - Seated Knee Flexion Extension AROM   - 3 x daily - 7 x weekly - 1-2 sets - 10 reps - 5 seconds hold - Supine Heel Slide with Strap  - 3 x daily - 7 x weekly - 1-2 sets - 10 reps - 5 seconds hold - Active Straight Leg Raise with Quad Set  - 3 x daily - 7 x weekly - 1-2 sets - 10 reps - 5 seconds hold  Patient Education - Leg Elevation for Swelling  ASSESSMENT:  CLINICAL IMPRESSION: She has less pain today than 2 days ago. However she continues to slow cautious movements.  Her range is better than 2 days ago but less than at evaluation.  She still presents as if she manipulated scar tissue accidentally with typical pain response to that.  Patient continues to benefit from skilled PT.  OBJECTIVE IMPAIRMENTS: Abnormal gait, decreased activity tolerance, decreased balance, decreased coordination, decreased endurance, decreased mobility, difficulty walking, decreased ROM, decreased strength, hypomobility,  increased edema, increased fascial restrictions, impaired perceived functional ability, impaired flexibility, improper body mechanics, and pain.   ACTIVITY LIMITATIONS: carrying, lifting, bending, sitting, standing, squatting, sleeping, stairs, transfers, bed mobility, and locomotion level  PARTICIPATION LIMITATIONS: meal prep, cleaning, laundry, interpersonal relationship, driving, shopping, community activity, and yard work  PERSONAL FACTORS:  breast CA, ACDF, lumbar fusion, history of C2 fracture,   are also affecting patient's functional outcome.   REHAB POTENTIAL: Good  CLINICAL DECISION MAKING: Stable/uncomplicated  EVALUATION COMPLEXITY: Low   GOALS: Goals reviewed with patient? Yes  SHORT TERM GOALS: (target date for Short term goals are 3 weeks 10/15/2023)   1.  Patient will demonstrate independent use of home exercise program to maintain progress from in clinic treatments.  Goal status: Ongoing 10/01/2023  LONG TERM GOALS: (target dates for all long term goals are 12 weeks  12/17/2023 )   1. Patient will demonstrate/report pain at worst less than or equal to 2/10 to facilitate minimal limitation in daily activity secondary to pain symptoms.  Goal status: Ongoing 10/01/2023   2. Patient will demonstrate independent use of home exercise program to facilitate ability to maintain/progress functional gains from skilled physical therapy services.  Goal status: Ongoing 10/01/2023   3. Patient will demonstrate FOTO outcome > or = 53 % to indicate reduced disability due to condition.  Goal status: Ongoing 10/01/2023   4.  Patient will demonstrate Rt LE MMT 5/5 throughout to faciltiate usual transfers, stairs, squatting at [facility] for daily life.   Goal status: Ongoing 10/01/2023   5.  Patient will demonstrate Rt knee AROM 0-120 deg to facilitate sitting, transfers, ambulation s deviation due to range.  Goal status: Ongoing 10/01/2023   6.  Patient will demonstrate  ascending/descending stairs reciprocally s UE assist for community integration.   Goal status: Ongoing 10/01/2023   7.  Patient will demonstrate independent ambulation community distances > 300 ft for community integration.  Goal Status: Ongoing 10/01/2023   PLAN:  PT FREQUENCY: 1-2x/week  PT DURATION: 12 weeks  PLANNED INTERVENTIONS: Can include 16109- PT Re-evaluation, 97110-Therapeutic exercises, 97530- Therapeutic activity, 97112- Neuromuscular re-education, 97535- Self Care, 97140- Manual therapy, [id number]- Gait  training, 82956- Orthotic Fit/training, 21308- Canalith repositioning, 65784- Aquatic Therapy, [phone]- Electrical stimulation (unattended), [id number]- Electrical stimulation (manual), [id number]- Vasopneumatic device, [id number]- Ultrasound, [id number]- Traction (mechanical), [id number]- Ionotophoresis 4mg /ml Dexamethasone, Patient/Family education, Balance training, Stair training, Taping, Dry Needling, Joint mobilization, Joint manipulation, Spinal manipulation, Spinal mobilization, Scar mobilization, Vestibular training, Visual/preceptual remediation/compensation, DME instructions, Cryotherapy, and Moist heat.  All performed as medically necessary.  All included unless contraindicated  PLAN FOR NEXT SESSION:   continue with manual therapy & therapeutic exercises progressing towards function.  Vaso for edema.     Vladimir Faster, PT, DPT 10/03/2023, 12:52 PM

## 2023-10-03 NOTE — Progress Notes (Signed)
Post-Op Visit Note   Patient: Alexis Fox           Date of Birth: [birth year]           MRN: [id number] Visit Date: 10/03/2023 PCP: Thana Ates, MD   Assessment & Plan:  Chief Complaint:  Chief Complaint  Patient presents with   Right Knee - Pain   Visit Diagnoses:  1. S/P total knee arthroplasty, right     Plan: Patient is a 67 year old female who presents s/p right total knee arthroplasty on 09/03/2023.  Doing well up until Monday when she "overdid it".  She states that she was flexing her hip up and bending her knee and as she extended her leg she had a popping sensation and a lot of severe pain localizing around the patella.  This happened on Monday and her pain is improved from this episode today but not back to her baseline postsurgical pain yet.  She had a lot of difficulty with physical therapy on Tuesday but this was easier significantly today.  She was bending to about 112 degrees but now she is bending only to about 94 degrees according to her.  No fevers or chills.  She is able to perform straight leg raise now which she was not able to perform a couple days ago.  Is walking with some increased pain.  On exam, patient has right knee with 0 degrees extension and 95 degrees of knee flexion.  No calf tenderness.  Negative Homans' sign.  Excellent quad strength rated 5/5.  No defects palpable in the quadricep tendon.  She has excellent ability to perform straight leg raise without extensor lag.  There is no gross laxity to side-to-side mobility of the patella.  No significant effusion of the knee.  Incision is healing well without evidence of infection or dehiscence.  Plan is radiographs today which demonstrate no significant change compared with prior radiographs.  Ultrasound exam demonstrates no defect in the quadricep tendon.  Plan is continue with physical therapy and home exercise program and follow-up with Dr. August Saucer at her next scheduled appointment in 2 weeks for  clinical recheck.  With significant improvement over the last couple days think that this will eventually become a nonissue but if any concern, she can raise this with Dr. August Saucer in a couple weeks.  If anything changes drastically in the next couple weeks, she will reach out to [employer] or myself.  Follow-Up Instructions: No follow-ups on file.   Orders:  Orders Placed This Encounter  Procedures   XR KNEE 3 VIEW RIGHT   No orders of the defined types were placed in this encounter.   Imaging: No results found.  PMFS History: Patient Active Problem List   Diagnosis Date Noted   Arthritis of knee, right 10/01/2023   DJD (degenerative joint disease) of knee 09/03/2023   S/P total knee arthroplasty, right 09/03/2023   Unilateral primary osteoarthritis, right knee 12/28/2022   Pain in left ankle and joints of left foot 09/13/2022   Hypersomnia, persistent 08/21/2022   Excessive daytime sleepiness 08/21/2022   MCI (mild cognitive impairment) 08/21/2022   History of breast cancer 08/21/2022   OSA on CPAP 08/21/2022   Pain in left shoulder 03/01/2021   Allergic rhinitis 02/21/2021   Anxiety 02/21/2021   Contact dermatitis due to plant 02/21/2021   Dysphagia 02/21/2021   Estrogen receptor negative status (ER-) 02/21/2021   Gastroesophageal reflux disease 02/21/2021   Gastroparesis 02/21/2021   Insomnia 02/21/2021  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   Morton's neuroma of left foot 02/21/2021   Personal history of malignant neoplasm of breast 02/21/2021   Vitamin D deficiency 02/21/2021   Oth nondisplaced dens fracture, init for clos fx (HCC) 02/04/2020   S/P cervical spinal fusion 01/22/2020   Memory deficit 01/14/2020   Sacroiliac dysfunction 01/14/2020   Neck pain 11/05/2019   Body mass index (BMI) 25.0-25.9, adult 11/05/2019   Unilateral primary osteoarthritis, right hip 09/01/2019   Trochanteric bursitis, left hip 07/14/2019   S/P lumbar fusion  05/18/2019   OSA (obstructive sleep apnea) 10/28/2018   Pain managed using patient-controlled analgesia (PCA) 10/25/2018   Fall 10/24/2018   Fall from ladder 10/23/2018   DDD (degenerative disc disease), lumbosacral 09/15/2018   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   Central line complication 10/22/2016   Genetic testing 05/15/2016   Family history of breast cancer    Family history of brain cancer    Breast cancer of upper-outer quadrant of left female breast (HCC) 03/20/2016   Transaminitis 10/03/2015   Symptomatic cholelithiasis 10/03/2015   RUQ abdominal pain    Lumbosacral neuritis 11/10/2014   Lumbar radiculopathy 10/19/2014   Depression 06/24/2013   RLS (restless legs syndrome) 06/24/2013   Low back pain 05/19/2013   Past Medical History:  Diagnosis Date   Anxiety    Arthritis    knees   Breast cancer (HCC)    Breast cancer of upper-outer quadrant of left female breast (HCC) 03/20/2016   C2 cervical fracture (HCC)    10/22/18, following fall from ladder   Cervicalgia    Cholecystitis    Complication of anesthesia    BP "crashes" post op   Early cataracts, bilateral    Family history of brain cancer    Family history of breast cancer    Hot flashes    Personal history of chemotherapy    Personal history of radiation therapy    Restless leg syndrome    Sleep apnea 10/03/2018   wears CPAP    Family History  Problem Relation Age of Onset   Heart disease Mother    Pulmonary fibrosis Mother    Hypertension Mother    Sleep apnea Mother    Cancer Father 5       astocytoma   Hypertension Brother    Lymphoma Maternal Aunt    Anesthesia problems Neg Hx     Past Surgical History:  Procedure Laterality Date   ANTERIOR CERVICAL DECOMP/DISCECTOMY FUSION N/A 01/22/2020   Procedure: ACDF - C2-C3;  Surgeon: Tia Alert, MD;  Location: [facility];  Service: Neurosurgery;  Laterality: N/A;   BACK SURGERY     x2   BREAST BIOPSY     BREAST LUMPECTOMY Left  2018   BUNIONECTOMY     CHOLECYSTECTOMY N/A 10/04/2015   Procedure: LAPAROSCOPIC CHOLECYSTECTOMY WITH INTRAOPERATIVE CHOLANGIOGRAM;  Surgeon: Gaynelle Adu, MD;  Location: [facility] OR;  Service: General;  Laterality: N/A;   ELBOW SURGERY     right for epicondylitis 2010    ESOPHAGEAL MANOMETRY N/A 11/30/2020   Procedure: ESOPHAGEAL MANOMETRY (EM);  Surgeon: Kerin Salen, MD;  Location: [facility];  Service: Gastroenterology;  Laterality: N/A;   FOOT SURGERY     1983 -tarsal tunnel release   IR GENERIC HISTORICAL  10/26/2016   IR CV LINE INJECTION 10/26/2016 [facility]   LUMBAR DISC SURGERY  04/04/2012   MASS EXCISION Left 02/06/2021   Procedure: EXCISION LEFT FOOT SOFT TISSUE BETWEEN SECOND AND THIRD AND  THIRD AND FOURTH TOES;  Surgeon: Asencion Islam, DPM;  Location: [facility];  Service: Podiatry;  Laterality: Left;   PORT-A-CATH REMOVAL N/A 11/15/2016   Procedure: REMOVAL PORT-A-CATH;  Surgeon: Emelia Loron, MD;  Location: [facility];  Service: General;  Laterality: N/A;   PORTACATH PLACEMENT Right 04/02/2016   Procedure: INSERTION PORT-A-CATH WITH [place];  Surgeon: Emelia Loron, MD;  Location: [facility];  Service: General;  Laterality: Right;   RADIOACTIVE SEED GUIDED PARTIAL MASTECTOMY/AXILLARY SENTINEL NODE BIOPSY/AXILLARY NODE DISSECTION Left 09/12/2016   Procedure: LEFT BREAST SEED GUIDED LUMPECTOMY, LEFT AXILLARY SENTINEL NODE BIOPSY, LEFT SEED GUIDED AXILLARY NODE EXCISION( TARGETED AXILLARY DISSECTION), BLUE DYE INJECTION;  Surgeon: Emelia Loron, MD;  Location: [facility];  Service: General;  Laterality: Left;   SACROILIAC JOINT FUSION Left 05/18/2020   Procedure: LEFT SACROILIAC JOINT FUSION;  Surgeon: Estill Bamberg, MD;  Location: [facility] OR;  Service: Orthopedics;  Laterality: Left;   SPINAL FUSION  5/12   L5-S1   SPINAL FUSION  05/18/2019   L4-5   TARSAL TUNNEL RELEASE     TOTAL KNEE ARTHROPLASTY Right 09/03/2023    Procedure: RIGHT TOTAL KNEE ARTHROPLASTY;  Surgeon: Cammy Copa, MD;  Location: [facility] OR;  Service: Orthopedics;  Laterality: Right;   Social History   Occupational History   Not on file  Tobacco Use   Smoking status: Never   Smokeless tobacco: Never  Vaping Use   Vaping status: Never Used  Substance and Sexual Activity   Alcohol use: Yes    Alcohol/week: 1.0 standard drink of alcohol    Types: 1 Cans of beer per week    Comment: Rarely 1/month   Drug use: No   Sexual activity: Not Currently    Birth control/protection: Post-menopausal

## 2023-10-07 ENCOUNTER — Encounter: Payer: Self-pay | Admitting: Rehabilitative and Restorative Service Providers"

## 2023-10-07 ENCOUNTER — Ambulatory Visit (INDEPENDENT_AMBULATORY_CARE_PROVIDER_SITE_OTHER): Payer: Medicare PPO | Admitting: Rehabilitative and Restorative Service Providers"

## 2023-10-07 DIAGNOSIS — R262 Difficulty in walking, not elsewhere classified: Secondary | ICD-10-CM | POA: Diagnosis not present

## 2023-10-07 DIAGNOSIS — G8929 Other chronic pain: Secondary | ICD-10-CM | POA: Diagnosis not present

## 2023-10-07 DIAGNOSIS — M25661 Stiffness of right knee, not elsewhere classified: Secondary | ICD-10-CM | POA: Diagnosis not present

## 2023-10-07 DIAGNOSIS — R600 Localized edema: Secondary | ICD-10-CM

## 2023-10-07 DIAGNOSIS — M6281 Muscle weakness (generalized): Secondary | ICD-10-CM

## 2023-10-07 DIAGNOSIS — M25561 Pain in right knee: Secondary | ICD-10-CM | POA: Diagnosis not present

## 2023-10-07 NOTE — Therapy (Signed)
OUTPATIENT PHYSICAL THERAPY  TREATMENT   Patient Name: Alexis Fox MRN: [id number] DOB:15-Jan-1956, 67 y.o., female Today's Date: 10/07/2023  END OF SESSION:  PT End of Session - 10/07/23 1255     Visit Number 5    Number of Visits 24    Date for PT Re-Evaluation 12/17/23    Authorization Type [employer] $20 copay    Authorization Time Period $20 COPAYApproved  Authorization #[id number]  Tracking #[id number]  09/24/2023-12/17/2023    Authorization - Visit Number 5    Progress Note Due on Visit 10    PT Start Time 1259    PT Stop Time 1349    PT Time Calculation (min) 50 min    Activity Tolerance Patient tolerated treatment well    Behavior During Therapy [facility] for tasks assessed/performed                 Past Medical History:  Diagnosis Date   Anxiety    Arthritis    knees   Breast cancer (HCC)    Breast cancer of upper-outer quadrant of left female breast (HCC) 03/20/2016   C2 cervical fracture (HCC)    10/22/18, following fall from ladder   Cervicalgia    Cholecystitis    Complication of anesthesia    BP "crashes" post op   Early cataracts, bilateral    Family history of brain cancer    Family history of breast cancer    Hot flashes    Personal history of chemotherapy    Personal history of radiation therapy    Restless leg syndrome    Sleep apnea 10/03/2018   wears CPAP   Past Surgical History:  Procedure Laterality Date   ANTERIOR CERVICAL DECOMP/DISCECTOMY FUSION N/A 01/22/2020   Procedure: ACDF - C2-C3;  Surgeon: Tia Alert, MD;  Location: [facility] OR;  Service: Neurosurgery;  Laterality: N/A;   BACK SURGERY     x2   BREAST BIOPSY     BREAST LUMPECTOMY Left 2018   BUNIONECTOMY     CHOLECYSTECTOMY N/A 10/04/2015   Procedure: LAPAROSCOPIC CHOLECYSTECTOMY WITH INTRAOPERATIVE CHOLANGIOGRAM;  Surgeon: Gaynelle Adu, MD;  Location: [facility] OR;  Service: General;  Laterality: N/A;   ELBOW SURGERY     right for epicondylitis 2010    ESOPHAGEAL  MANOMETRY N/A 11/30/2020   Procedure: ESOPHAGEAL MANOMETRY (EM);  Surgeon: Kerin Salen, MD;  Location: [facility];  Service: Gastroenterology;  Laterality: N/A;   FOOT SURGERY     1983 -tarsal tunnel release   IR GENERIC HISTORICAL  10/26/2016   IR CV LINE INJECTION 10/26/2016 [facility]-INTERV RAD   LUMBAR DISC SURGERY  04/04/2012   MASS EXCISION Left 02/06/2021   Procedure: EXCISION LEFT FOOT SOFT TISSUE BETWEEN SECOND AND THIRD AND THIRD AND FOURTH TOES;  Surgeon: Asencion Islam, DPM;  Location: [facility];  Service: Podiatry;  Laterality: Left;   PORT-A-CATH REMOVAL N/A 11/15/2016   Procedure: REMOVAL PORT-A-CATH;  Surgeon: Emelia Loron, MD;  Location: [facility];  Service: General;  Laterality: N/A;   PORTACATH PLACEMENT Right 04/02/2016   Procedure: INSERTION PORT-A-CATH WITH [place];  Surgeon: Emelia Loron, MD;  Location: [facility];  Service: General;  Laterality: Right;   RADIOACTIVE SEED GUIDED PARTIAL MASTECTOMY/AXILLARY SENTINEL NODE BIOPSY/AXILLARY NODE DISSECTION Left 09/12/2016   Procedure: LEFT BREAST SEED GUIDED LUMPECTOMY, LEFT AXILLARY SENTINEL NODE BIOPSY, LEFT SEED GUIDED AXILLARY NODE EXCISION( TARGETED AXILLARY DISSECTION), BLUE DYE INJECTION;  Surgeon: Emelia Loron, MD;  Location: [facility];  Service: General;  Laterality: Left;   SACROILIAC JOINT FUSION Left 05/18/2020   Procedure: LEFT SACROILIAC JOINT FUSION;  Surgeon: Estill Bamberg, MD;  Location: [facility] OR;  Service: Orthopedics;  Laterality: Left;   SPINAL FUSION  5/12   L5-S1   SPINAL FUSION  05/18/2019   L4-5   TARSAL TUNNEL RELEASE     TOTAL KNEE ARTHROPLASTY Right 09/03/2023   Procedure: RIGHT TOTAL KNEE ARTHROPLASTY;  Surgeon: Cammy Copa, MD;  Location: [facility] OR;  Service: Orthopedics;  Laterality: Right;   Patient Active Problem List   Diagnosis Date Noted   Arthritis of knee, right 10/01/2023   DJD (degenerative joint disease) of knee  09/03/2023   S/P total knee arthroplasty, right 09/03/2023   Unilateral primary osteoarthritis, right knee 12/28/2022   Pain in left ankle and joints of left foot 09/13/2022   Hypersomnia, persistent 08/21/2022   Excessive daytime sleepiness 08/21/2022   MCI (mild cognitive impairment) 08/21/2022   History of breast cancer 08/21/2022   OSA on CPAP 08/21/2022   Pain in left shoulder 03/01/2021   Allergic rhinitis 02/21/2021   Anxiety 02/21/2021   Contact dermatitis due to plant 02/21/2021   Dysphagia 02/21/2021   Estrogen receptor negative status (ER-) 02/21/2021   Gastroesophageal reflux disease 02/21/2021   Gastroparesis 02/21/2021   Insomnia 02/21/2021   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   Morton's neuroma of left foot 02/21/2021   Personal history of malignant neoplasm of breast 02/21/2021   Vitamin D deficiency 02/21/2021   Oth nondisplaced dens fracture, init for clos fx (HCC) 02/04/2020   S/P cervical spinal fusion 01/22/2020   Memory deficit 01/14/2020   Sacroiliac dysfunction 01/14/2020   Neck pain 11/05/2019   Body mass index (BMI) 25.0-25.9, adult 11/05/2019   Unilateral primary osteoarthritis, right hip 09/01/2019   Trochanteric bursitis, left hip 07/14/2019   S/P lumbar fusion 05/18/2019   OSA (obstructive sleep apnea) 10/28/2018   Pain managed using patient-controlled analgesia (PCA) 10/25/2018   Fall 10/24/2018   Fall from ladder 10/23/2018   DDD (degenerative disc disease), lumbosacral 09/15/2018   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   Central line complication 10/22/2016   Genetic testing 05/15/2016   Family history of breast cancer    Family history of brain cancer    Breast cancer of upper-outer quadrant of left female breast (HCC) 03/20/2016   Transaminitis 10/03/2015   Symptomatic cholelithiasis 10/03/2015   RUQ abdominal pain    Lumbosacral neuritis 11/10/2014   Lumbar radiculopathy 10/19/2014   Depression  06/24/2013   RLS (restless legs syndrome) 06/24/2013   Low back pain 05/19/2013    PCP: Thana Ates MD  REFERRING PROVIDER: Julieanne Cotton, PA-C   REFERRING DIAG: [phone] (ICD-10-CM) - Chronic pain of right knee   THERAPY DIAG:  Chronic pain of right knee  Muscle weakness (generalized)  Stiffness of right knee, not elsewhere classified  Difficulty in walking, not elsewhere classified  Localized edema  Rationale for Evaluation and Treatment: Rehabilitation  ONSET DATE: 09/03/2023 Rt TKA  SUBJECTIVE:   SUBJECTIVE STATEMENT: Pt indicated feeling she was set back about 2 weeks.  Still feeling like some things are fully back to where she was.  Pt indicated strength/quad still trouble.  Felt like a soft tissue rubbing on bone laterally.   Saw PA with good report imaging.   PERTINENT HISTORY: breast CA, ACDF, lumbar fusion, history of C2 fracture,   PAIN:  NPRS scale: today knee resting / no motion  1/10, moving LE  3/10 - 6/10 and over last 3 days worst 8/10 with twisting motion "quad was screaming." Pain location: Rt knee  Pain description: stiffness, tight, achy Aggravating factors: prolonged positioning, transfers (car transfers), increased WB activity, getting comfortable to sleep.  Relieving factors: pain medicine, ice   PRECAUTIONS: None  WEIGHT BEARING RESTRICTIONS: No  FALLS:  Has patient fallen in last 6 months? 1 fall day of surgery in bathroom at hospital  No fear of falling reported.   LIVING ENVIRONMENT: Lives in: House/apartment Stairs: 1 step to entry with bilateral rails.  Has following equipment at home: Cane, walker  OCCUPATION: No work  PLOF: Independent, likes to camp in RV trailer, has dogs.  Has silver sneakers  PATIENT GOALS: Reduce pain, get back to walking  OBJECTIVE:   PATIENT SURVEYS:  09/24/2023 FOTO intake: 40   predicted:  53  COGNITION: 09/24/2023 Overall cognitive status: WFL    SENSATION: 09/24/2023 None  tested today  EDEMA:  09/24/2023 Localized edema noted visually Rt knee/lower leg  MUSCLE LENGTH: 09/24/2023 None tested today  POSTURE:  09/24/2023 Weight deviation to Lt in standing.   PALPATION: 10/01/2023:  tenderness to palpation quad tendon and vastus medialis just proximal to tendon.  Pain lateral knee with flexion motions.  Pt able to engage quads with some pain but no divot or indention in quad muscle felt.   09/24/2023 General tenderness to touch.   LOWER EXTREMITY ROM:   ROM Right 09/24/2023 Right 10/03/2023  Hip flexion    Hip extension    Hip abduction    Hip adduction    Hip internal rotation    Hip external rotation    Knee flexion 111 AROM in supine heel slide Seated heel slide A: 94* P: 99* Supine AA: 104* Strap/ball   Knee extension 0 AROM in sitting LAQ Seated heel slide A: 0*  Ankle dorsiflexion    Ankle plantarflexion    Ankle inversion    Ankle eversion     (Blank rows = not tested)  LOWER EXTREMITY MMT:  MMT Right 09/24/2023 Left 09/24/2023  Hip flexion 5/5 5/5  Hip extension    Hip abduction    Hip adduction    Hip internal rotation    Hip external rotation    Knee flexion 4/5 5/5  Knee extension 4/5 5/5  Ankle dorsiflexion 5/5 5/5  Ankle plantarflexion    Ankle inversion    Ankle eversion     (Blank rows = not tested)  LOWER EXTREMITY SPECIAL TESTS:  09/24/2023 None tested today  FUNCTIONAL TESTS:  09/24/2023 18 inch chair transfer: able to perform on 1st attempt but with increased difficulty Lt SLS:  unable Rt SLS:  unable   GAIT: 09/24/2023 Mod independent c SPC in Lt UE.  Reduced stance on Rt leg with weight shift off Rt.  Reduced toe off progression.  TODAY'S TREATMENT                                                                           DATE: 10/07/2023: Therapeutic Exercise:  SciFit bike seat 10, LE only Lvl 2 8 mins Incline gastroc stretch 30 sec x 3 bilaterally Seated quad set 5 sec hold x 10 Rt leg Seated quad set c SLR Rt 2x 10   TherActivity ( to improve transfers, ambulation, stairs) Leg press double leg 62 lbs x 15, single leg Rt 25 lbs x 15  Neuro Re-ed Tandem stance on foam in // bars with occasional HHA 1 min x 1 bilateral  Tandem ambulation fwd/back on foam in // bars with occasional HHA 6 ft x 5 each way    Vaso  right knee 34* medium compression with elevation for 10 min.  TODAY'S TREATMENT                                                                          DATE: 10/03/2023: Therapeutic Exercise:  SciFit bike seat 10 with BLEs & BUEs level 1 for 8 min.  Heel slide on pillow case (reduce resistance) ext / quad set 5 sec and active knee flexion with end range stretch assisted LLE / hamstring set 5 sec for 10 reps.  Slow controlled motions.   Supine SAQ with 40* arc tolerated today 10 reps with 2 sec rest bw reps to relax the quads. Heel slides with RLE on 18" ball with strap assistance 15 reps  Manual Therapy: PROM with overpressure for flexion & extension Ice massage to lateral knee, distal quad, quad tendon & incision line.   Vaso  right knee 34* medium compression with elevation for 10 min.   TODAY'S TREATMENT                                                                          DATE: 10/01/2023: Therapeutic Exercise:  SAQ with Yoga block under heel so 20* ROM which was in her least painful arc 10 reps with 5 sec rest bw reps. Heel slides with BLEs on red Therapy ball with strap assistance 15 reps 2 sets.  Manual Therapy: See objective data for pain / palpation. Ralph Dowdy case manager came to PT session per request.  We will monitor pain response thru next PT appt that is 2 days away.  Sheri will check with [place], [place] & Dr. August Saucer also. PROM right knee with pt supine  Vaso to right knee  34* medium compression with elevation for 15 min.     TODAY'S TREATMENT  DATE: 09/26/2023: Therapeutic Exercise:  SciFit bike seat 10 with BLEs only level 1 for 8 min.  Sit to stand off 24" bar stool 10 reps without UE assist;  PT demo & verbal cues on proper technique - from 18" chair with armrests 10 reps Seated heel slides with foot on pillow case ext / quad set 5 sec and active knee flexion 5 sec hold 10 reps. Seated RLE SLR 10 reps Supine heel slide with strap & ball ext / quad set 5 sec and knee flexion 5 sec hold 15 reps Supine SLR RLE 10 reps  Self-Care: PT educated with demo, verbal & HO cues on elevation with LE higher than heart for >15 min >/= 2x/day and ankle alphabet for small muscle activity. Pt verbalized understanding.   Vaso 10 mins Rt knee in elevation medium compression 34 deg.     PATIENT EDUCATION:  09/24/2023 Education details: HEP, POC Person educated: Patient Education method: Programmer, multimedia, Demonstration, Verbal cues, and Handouts Education comprehension: verbalized understanding, returned demonstration, and verbal cues required  HOME EXERCISE PROGRAM: Access Code: [email or web address] URL: [email or web address] Date: 09/26/2023 Prepared by: Vladimir Faster  Exercises - Supine Heel Slide  - 3-5 x daily - 7 x weekly - 1-2 sets - 10 reps - 2 hold - Seated Quad Set  - 3-5 x daily - 7 x weekly - 1 sets - 10 reps - 5 hold - Seated Straight Leg Heel Taps  - 1-2 x daily - 7 x weekly - 3 sets - 10 reps - Seated Long Arc Quad  - 3-5 x daily - 7 x weekly - 1-2 sets - 10 reps - 2 hold - Sit to Stand  - 3 x daily - 7 x weekly - 1 sets - 10 reps - Seated Knee Flexion Extension AROM   - 3 x daily - 7 x weekly - 1-2 sets - 10 reps - 5 seconds hold - Supine Heel Slide with Strap  - 3 x daily - 7 x weekly - 1-2 sets - 10 reps - 5 seconds hold - Active Straight Leg Raise with Quad Set  - 3 x daily -  7 x weekly - 1-2 sets - 10 reps - 5 seconds hold  Patient Education - Leg Elevation for Swelling  ASSESSMENT:  CLINICAL IMPRESSION: Pt to benefit from continued quad strengthening and improved muscle performance to improve ambulation and other functional movement pattern.  Some limits in WB activity due to symptoms left over from strain previous described.  Discussed importance of quad set, SLR to improve quad strength in HEP.    OBJECTIVE IMPAIRMENTS: Abnormal gait, decreased activity tolerance, decreased balance, decreased coordination, decreased endurance, decreased mobility, difficulty walking, decreased ROM, decreased strength, hypomobility, increased edema, increased fascial restrictions, impaired perceived functional ability, impaired flexibility, improper body mechanics, and pain.   ACTIVITY LIMITATIONS: carrying, lifting, bending, sitting, standing, squatting, sleeping, stairs, transfers, bed mobility, and locomotion level  PARTICIPATION LIMITATIONS: meal prep, cleaning, laundry, interpersonal relationship, driving, shopping, community activity, and yard work  PERSONAL FACTORS:  breast CA, ACDF, lumbar fusion, history of C2 fracture,   are also affecting patient's functional outcome.   REHAB POTENTIAL: Good  CLINICAL DECISION MAKING: Stable/uncomplicated  EVALUATION COMPLEXITY: Low   GOALS: Goals reviewed with patient? Yes  SHORT TERM GOALS: (target date for Short term goals are 3 weeks 10/15/2023)   1.  Patient will demonstrate independent use of home exercise program to maintain progress from in clinic treatments.  Goal status: Ongoing 10/01/2023  LONG TERM GOALS: (target dates for all long term goals are 12 weeks  12/17/2023 )   1. Patient will demonstrate/report pain at worst less than or equal to 2/10 to facilitate minimal limitation in daily activity secondary to pain symptoms.  Goal status: Ongoing 10/01/2023   2. Patient will demonstrate independent use of home  exercise program to facilitate ability to maintain/progress functional gains from skilled physical therapy services.  Goal status: Ongoing 10/01/2023   3. Patient will demonstrate FOTO outcome > or = 53 % to indicate reduced disability due to condition.  Goal status: Ongoing 10/01/2023   4.  Patient will demonstrate Rt LE MMT 5/5 throughout to faciltiate usual transfers, stairs, squatting at [facility] for daily life.   Goal status: Ongoing 10/01/2023   5.  Patient will demonstrate Rt knee AROM 0-120 deg to facilitate sitting, transfers, ambulation s deviation due to range.  Goal status: Ongoing 10/01/2023   6.  Patient will demonstrate ascending/descending stairs reciprocally s UE assist for community integration.   Goal status: Ongoing 10/01/2023   7.  Patient will demonstrate independent ambulation community distances > 300 ft for community integration.  Goal Status: Ongoing 10/01/2023   PLAN:  PT FREQUENCY: 1-2x/week  PT DURATION: 12 weeks  PLANNED INTERVENTIONS: Can include 45409- PT Re-evaluation, 97110-Therapeutic exercises, 97530- Therapeutic activity, 97112- Neuromuscular re-education, 97535- Self Care, 97140- Manual therapy, [phone]- Gait training, [phone]- Orthotic Fit/training, [phone]- Canalith repositioning, [id number]- Aquatic Therapy, 97014- Electrical stimulation (unattended), [id number]- Electrical stimulation (manual), [id number]- Vasopneumatic device, [id number]- Ultrasound, [id number]- Traction (mechanical), [id number]- Ionotophoresis 4mg /ml Dexamethasone, Patient/Family education, Balance training, Stair training, Taping, Dry Needling, Joint mobilization, Joint manipulation, Spinal manipulation, Spinal mobilization, Scar mobilization, Vestibular training, Visual/preceptual remediation/compensation, DME instructions, Cryotherapy, and Moist heat.  All performed as medically necessary.  All included unless contraindicated  PLAN FOR NEXT SESSION:   Compliant surface balance, quad strengthening as able.      Chyrel Masson, PT, DPT, OCS, ATC 10/07/23  1:39 PM

## 2023-10-09 ENCOUNTER — Encounter: Payer: Medicare PPO | Admitting: Rehabilitative and Restorative Service Providers"

## 2023-10-10 DIAGNOSIS — Z79899 Other long term (current) drug therapy: Secondary | ICD-10-CM | POA: Diagnosis not present

## 2023-10-10 DIAGNOSIS — K219 Gastro-esophageal reflux disease without esophagitis: Secondary | ICD-10-CM | POA: Diagnosis not present

## 2023-10-10 DIAGNOSIS — F325 Major depressive disorder, single episode, in full remission: Secondary | ICD-10-CM | POA: Diagnosis not present

## 2023-10-10 DIAGNOSIS — M8589 Other specified disorders of bone density and structure, multiple sites: Secondary | ICD-10-CM | POA: Diagnosis not present

## 2023-10-10 DIAGNOSIS — Z0000 Encounter for general adult medical examination without abnormal findings: Secondary | ICD-10-CM | POA: Diagnosis not present

## 2023-10-10 DIAGNOSIS — R4189 Other symptoms and signs involving cognitive functions and awareness: Secondary | ICD-10-CM | POA: Diagnosis not present

## 2023-10-10 DIAGNOSIS — E7800 Pure hypercholesterolemia, unspecified: Secondary | ICD-10-CM | POA: Diagnosis not present

## 2023-10-10 DIAGNOSIS — G2581 Restless legs syndrome: Secondary | ICD-10-CM | POA: Diagnosis not present

## 2023-10-14 ENCOUNTER — Encounter: Payer: Self-pay | Admitting: Rehabilitative and Restorative Service Providers"

## 2023-10-14 ENCOUNTER — Ambulatory Visit (INDEPENDENT_AMBULATORY_CARE_PROVIDER_SITE_OTHER): Payer: Medicare PPO | Admitting: Rehabilitative and Restorative Service Providers"

## 2023-10-14 DIAGNOSIS — M25661 Stiffness of right knee, not elsewhere classified: Secondary | ICD-10-CM | POA: Diagnosis not present

## 2023-10-14 DIAGNOSIS — R600 Localized edema: Secondary | ICD-10-CM | POA: Diagnosis not present

## 2023-10-14 DIAGNOSIS — R262 Difficulty in walking, not elsewhere classified: Secondary | ICD-10-CM | POA: Diagnosis not present

## 2023-10-14 DIAGNOSIS — M25561 Pain in right knee: Secondary | ICD-10-CM

## 2023-10-14 DIAGNOSIS — G8929 Other chronic pain: Secondary | ICD-10-CM | POA: Diagnosis not present

## 2023-10-14 DIAGNOSIS — M6281 Muscle weakness (generalized): Secondary | ICD-10-CM

## 2023-10-14 NOTE — Therapy (Signed)
OUTPATIENT PHYSICAL THERAPY TREATMENT   Patient Name: Alexis Fox MRN: [id number] DOB:02/26/1956, 67 y.o., female Today's Date: 10/14/2023  END OF SESSION:  PT End of Session - 10/14/23 1142     Visit Number 6    Number of Visits 24    Date for PT Re-Evaluation 12/17/23    Authorization Type [employer] $20 copay    Authorization Time Period $20 COPAYApproved  Authorization #[id number]  Tracking #[id number]  09/24/2023-12/17/2023    Authorization - Visit Number 6    Progress Note Due on Visit 10    PT Start Time 1140    PT Stop Time 1230    PT Time Calculation (min) 50 min    Activity Tolerance Patient tolerated treatment well    Behavior During Therapy [facility] for tasks assessed/performed                  Past Medical History:  Diagnosis Date   Anxiety    Arthritis    knees   Breast cancer (HCC)    Breast cancer of upper-outer quadrant of left female breast (HCC) 03/20/2016   C2 cervical fracture (HCC)    10/22/18, following fall from ladder   Cervicalgia    Cholecystitis    Complication of anesthesia    BP "crashes" post op   Early cataracts, bilateral    Family history of brain cancer    Family history of breast cancer    Hot flashes    Personal history of chemotherapy    Personal history of radiation therapy    Restless leg syndrome    Sleep apnea 10/03/2018   wears CPAP   Past Surgical History:  Procedure Laterality Date   ANTERIOR CERVICAL DECOMP/DISCECTOMY FUSION N/A 01/22/2020   Procedure: ACDF - C2-C3;  Surgeon: Tia Alert, MD;  Location: [facility] OR;  Service: Neurosurgery;  Laterality: N/A;   BACK SURGERY     x2   BREAST BIOPSY     BREAST LUMPECTOMY Left 2018   BUNIONECTOMY     CHOLECYSTECTOMY N/A 10/04/2015   Procedure: LAPAROSCOPIC CHOLECYSTECTOMY WITH INTRAOPERATIVE CHOLANGIOGRAM;  Surgeon: Gaynelle Adu, MD;  [facility];  Service: General;  Laterality: N/A;   ELBOW SURGERY     right for epicondylitis 2010    ESOPHAGEAL  MANOMETRY N/A 11/30/2020   Procedure: ESOPHAGEAL MANOMETRY (EM);  Surgeon: Kerin Salen, MD;  [facility];  Service: Gastroenterology;  Laterality: N/A;   FOOT SURGERY     1983 -tarsal tunnel release   IR GENERIC HISTORICAL  10/26/2016   IR CV LINE INJECTION 10/26/2016 [facility]INTER[facility] RAD   LUMBAR DISC SURGERY  04/04/2012   MASS EXCISION Left 02/06/2021   Procedure: EXCISION LEFT FOOT SOFT TISSUE BETWEEN SECOND AND THIRD AND THIRD AND FOURTH TOES;  Surgeon: Asencion Islam, DPM;  Location: [facility];  Service: Podiatry;  Laterality: Left;   PORT-A-CATH REMOVAL N/A 11/15/2016   Procedure: REMOVAL PORT-A-CATH;  Surgeon: Emelia Loron, MD;  Location: [facility];  Service: General;  Laterality: N/A;   PORTACATH PLACEMENT Right 04/02/2016   Procedure: INSERTION PORT-A-CATH WITH [place];  Surgeon: Emelia Loron, MD;  Location: [facility];  Service: General;  Laterality: Right;   RADIOACTIVE SEED GUIDED PARTIAL MASTECTOMY/AXILLARY SENTINEL NODE BIOPSY/AXILLARY NODE DISSECTION Left 09/12/2016   Procedure: LEFT BREAST SEED GUIDED LUMPECTOMY, LEFT AXILLARY SENTINEL NODE BIOPSY, LEFT SEED GUIDED AXILLARY NODE EXCISION( TARGETED AXILLARY DISSECTION), BLUE DYE INJECTION;  Surgeon: Emelia Loron, MD;  Location: [facility];  Service: General;  Laterality: Left;   SACROILIAC JOINT FUSION Left 05/18/2020   Procedure: LEFT SACROILIAC JOINT FUSION;  Surgeon: Estill Bamberg, MD;  Location: [facility] OR;  Service: Orthopedics;  Laterality: Left;   SPINAL FUSION  5/12   L5-S1   SPINAL FUSION  05/18/2019   L4-5   TARSAL TUNNEL RELEASE     TOTAL KNEE ARTHROPLASTY Right 09/03/2023   Procedure: RIGHT TOTAL KNEE ARTHROPLASTY;  Surgeon: Cammy Copa, MD;  Location: [facility] OR;  Service: Orthopedics;  Laterality: Right;   Patient Active Problem List   Diagnosis Date Noted   Arthritis of knee, right 10/01/2023   DJD (degenerative joint disease) of knee  09/03/2023   S/P total knee arthroplasty, right 09/03/2023   Unilateral primary osteoarthritis, right knee 12/28/2022   Pain in left ankle and joints of left foot 09/13/2022   Hypersomnia, persistent 08/21/2022   Excessive daytime sleepiness 08/21/2022   MCI (mild cognitive impairment) 08/21/2022   History of breast cancer 08/21/2022   OSA on CPAP 08/21/2022   Pain in left shoulder 03/01/2021   Allergic rhinitis 02/21/2021   Anxiety 02/21/2021   Contact dermatitis due to plant 02/21/2021   Dysphagia 02/21/2021   Estrogen receptor negative status (ER-) 02/21/2021   Gastroesophageal reflux disease 02/21/2021   Gastroparesis 02/21/2021   Insomnia 02/21/2021   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   Morton's neuroma of left foot 02/21/2021   Personal history of malignant neoplasm of breast 02/21/2021   Vitamin D deficiency 02/21/2021   Oth nondisplaced dens fracture, init for clos fx (HCC) 02/04/2020   S/P cervical spinal fusion 01/22/2020   Memory deficit 01/14/2020   Sacroiliac dysfunction 01/14/2020   Neck pain 11/05/2019   Body mass index (BMI) 25.0-25.9, adult 11/05/2019   Unilateral primary osteoarthritis, right hip 09/01/2019   Trochanteric bursitis, left hip 07/14/2019   S/P lumbar fusion 05/18/2019   OSA (obstructive sleep apnea) 10/28/2018   Pain managed using patient-controlled analgesia (PCA) 10/25/2018   Fall 10/24/2018   Fall from ladder 10/23/2018   DDD (degenerative disc disease), lumbosacral 09/15/2018   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   Central line complication 10/22/2016   Genetic testing 05/15/2016   Family history of breast cancer    Family history of brain cancer    Breast cancer of upper-outer quadrant of left female breast (HCC) 03/20/2016   Transaminitis 10/03/2015   Symptomatic cholelithiasis 10/03/2015   RUQ abdominal pain    Lumbosacral neuritis 11/10/2014   Lumbar radiculopathy 10/19/2014   Depression  06/24/2013   RLS (restless legs syndrome) 06/24/2013   Low back pain 05/19/2013    PCP: Thana Ates MD  REFERRING PROVIDER: Julieanne Cotton, PA-C   REFERRING DIAG: [phone] (ICD-10-CM) - Chronic pain of right knee   THERAPY DIAG:  Chronic pain of right knee  Muscle weakness (generalized)  Stiffness of right knee, not elsewhere classified  Difficulty in walking, not elsewhere classified  Localized edema  Rationale for Evaluation and Treatment: Rehabilitation  ONSET DATE: 09/03/2023 Rt TKA  SUBJECTIVE:   SUBJECTIVE STATEMENT: Pt indicated feeling general tightness/sore feeling.   Morning reported as tough.   PERTINENT HISTORY: breast CA, ACDF, lumbar fusion, history of C2 fracture,   PAIN:  NPRS scale: 2/10 Pain location: Rt knee  Pain description: stiffness, tight, achy Aggravating factors: prolonged positioning, transfers (car transfers), increased WB activity, getting comfortable to sleep.  Relieving factors: pain medicine, ice   PRECAUTIONS: None  WEIGHT BEARING RESTRICTIONS: No  FALLS:  Has patient  fallen in last 6 months? 1 fall day of surgery in bathroom at hospital  No fear of falling reported.   LIVING ENVIRONMENT: Lives in: House/apartment Stairs: 1 step to entry with bilateral rails.  Has following equipment at home: Cane, walker  OCCUPATION: No work  PLOF: Independent, likes to camp in RV trailer, has dogs.  Has silver sneakers  PATIENT GOALS: Reduce pain, get back to walking  OBJECTIVE:   PATIENT SURVEYS:  09/24/2023 FOTO intake: 40   predicted:  53  COGNITION: 09/24/2023 Overall cognitive status: WFL    SENSATION: 09/24/2023 None tested today  EDEMA:  09/24/2023 Localized edema noted visually Rt knee/lower leg  MUSCLE LENGTH: 09/24/2023 None tested today  POSTURE:  09/24/2023 Weight deviation to Lt in standing.   PALPATION: 10/01/2023:  tenderness to palpation quad tendon and vastus medialis just proximal to  tendon.  Pain lateral knee with flexion motions.  Pt able to engage quads with some pain but no divot or indention in quad muscle felt.   09/24/2023 General tenderness to touch.   LOWER EXTREMITY ROM:   ROM Right 09/24/2023 Right 10/03/2023  Hip flexion    Hip extension    Hip abduction    Hip adduction    Hip internal rotation    Hip external rotation    Knee flexion 111 AROM in supine heel slide Seated heel slide A: 94* P: 99* Supine AA: 104* Strap/ball   Knee extension 0 AROM in sitting LAQ Seated heel slide A: 0*  Ankle dorsiflexion    Ankle plantarflexion    Ankle inversion    Ankle eversion     (Blank rows = not tested)  LOWER EXTREMITY MMT:  MMT Right 09/24/2023 Left 09/24/2023 Right 10/14/2023 Left 10/14/2023  Hip flexion 5/5 5/5    Hip extension      Hip abduction      Hip adduction      Hip internal rotation      Hip external rotation      Knee flexion 4/5 5/5 4/5   Knee extension 4/5 5/5 5/5 35.2, 32 lbs 5/5 50, 47 lbs  Ankle dorsiflexion 5/5 5/5    Ankle plantarflexion      Ankle inversion      Ankle eversion       (Blank rows = not tested)  LOWER EXTREMITY SPECIAL TESTS:  09/24/2023 None tested today  FUNCTIONAL TESTS:  09/24/2023 18 inch chair transfer: able to perform on 1st attempt but with increased difficulty Lt SLS:  unable Rt SLS:  unable   GAIT: 09/24/2023 Mod independent c SPC in Lt UE.  Reduced stance on Rt leg with weight shift off Rt.  Reduced toe off progression.  TODAY'S TREATMENT                                                                          DATE: 10/14/2023: Therapeutic Exercise:  SciFit bike seat 10, LE only Lvl 2 10.5 mins Seated Rt knee LAQ x 15 with end range pauses  TherActivity ( to improve transfers, ambulation, stairs) Step on over  and down 4 inch step with single hand rail assist x 15  Leg press double leg 68 lbs x 15, single leg Rt 31 lbs x 15, x 6 (stopped due to discomfort in kee)  Neuro Re-ed SLS on black mat with contralateral leg corner touching x 6 each, performed bilaterally in // bars with occasional HHA Retro step x 12 bilateral   Vaso  right knee 34* medium compression with elevation for 10 min.  TODAY'S TREATMENT                                                                          DATE: 10/07/2023: Therapeutic Exercise:  SciFit bike seat 10, LE only Lvl 2 8 mins Incline gastroc stretch 30 sec x 3 bilaterally Seated quad set 5 sec hold x 10 Rt leg Seated quad set c SLR Rt 2x 10   TherActivity ( to improve transfers, ambulation, stairs) Leg press double leg 62 lbs x 15, single leg Rt 25 lbs x 15  Neuro Re-ed Tandem stance on foam in // bars with occasional HHA 1 min x 1 bilateral  Tandem ambulation fwd/back on foam in // bars with occasional HHA 6 ft x 5 each way    Vaso  right knee 34* medium compression with elevation for 10 min.  TODAY'S TREATMENT                                                                          DATE: 10/03/2023: Therapeutic Exercise:  SciFit bike seat 10 with BLEs & BUEs level 1 for 8 min.  Heel slide on pillow case (reduce resistance) ext / quad set 5 sec and active knee flexion with end range stretch assisted LLE / hamstring set 5 sec for 10 reps.  Slow controlled motions.   Supine SAQ with 40* arc tolerated today 10 reps with 2 sec rest bw reps to relax the quads. Heel slides with RLE on 18" ball with strap assistance 15 reps  Manual Therapy: PROM with overpressure for flexion & extension Ice massage to lateral knee, distal quad, quad tendon & incision line.   Vaso  right knee 34* medium compression with elevation for 10 min.   TODAY'S TREATMENT  DATE: 10/01/2023: Therapeutic Exercise:   SAQ with Yoga block under heel so 20* ROM which was in her least painful arc 10 reps with 5 sec rest bw reps. Heel slides with BLEs on red Therapy ball with strap assistance 15 reps 2 sets.  Manual Therapy: See objective data for pain / palpation. Ralph Dowdy case manager came to PT session per request.  We will monitor pain response thru next PT appt that is 2 days away.  Sheri will check with [place], [place] & Dr. August Saucer also. PROM right knee with pt supine  Vaso to right knee 34* medium compression with elevation for 15 min.      PATIENT EDUCATION:  09/24/2023 Education details: HEP, POC Person educated: Patient Education method: Programmer, multimedia, Demonstration, Verbal cues, and Handouts Education comprehension: verbalized understanding, returned demonstration, and verbal cues required  HOME EXERCISE PROGRAM: Access Code: [email or web address] URL: [email or web address] Date: 09/26/2023 Prepared by: Vladimir Faster  Exercises - Supine Heel Slide  - 3-5 x daily - 7 x weekly - 1-2 sets - 10 reps - 2 hold - Seated Quad Set  - 3-5 x daily - 7 x weekly - 1 sets - 10 reps - 5 hold - Seated Straight Leg Heel Taps  - 1-2 x daily - 7 x weekly - 3 sets - 10 reps - Seated Long Arc Quad  - 3-5 x daily - 7 x weekly - 1-2 sets - 10 reps - 2 hold - Sit to Stand  - 3 x daily - 7 x weekly - 1 sets - 10 reps - Seated Knee Flexion Extension AROM   - 3 x daily - 7 x weekly - 1-2 sets - 10 reps - 5 seconds hold - Supine Heel Slide with Strap  - 3 x daily - 7 x weekly - 1-2 sets - 10 reps - 5 seconds hold - Active Straight Leg Raise with Quad Set  - 3 x daily - 7 x weekly - 1-2 sets - 10 reps - 5 seconds hold  Patient Education - Leg Elevation for Swelling  ASSESSMENT:  CLINICAL IMPRESSION: Strength dynamometry updated today with difference between legs noted but overall improving in strength in Rt leg compared to previous measurements.  Ambulation independent in clinic today c reduced gait speed and wider  base of support noted.  Continued skilled PT services indicated.   OBJECTIVE IMPAIRMENTS: Abnormal gait, decreased activity tolerance, decreased balance, decreased coordination, decreased endurance, decreased mobility, difficulty walking, decreased ROM, decreased strength, hypomobility, increased edema, increased fascial restrictions, impaired perceived functional ability, impaired flexibility, improper body mechanics, and pain.   ACTIVITY LIMITATIONS: carrying, lifting, bending, sitting, standing, squatting, sleeping, stairs, transfers, bed mobility, and locomotion level  PARTICIPATION LIMITATIONS: meal prep, cleaning, laundry, interpersonal relationship, driving, shopping, community activity, and yard work  PERSONAL FACTORS:  breast CA, ACDF, lumbar fusion, history of C2 fracture,   are also affecting patient's functional outcome.   REHAB POTENTIAL: Good  CLINICAL DECISION MAKING: Stable/uncomplicated  EVALUATION COMPLEXITY: Low   GOALS: Goals reviewed with patient? Yes  SHORT TERM GOALS: (target date for Short term goals are 3 weeks 10/15/2023)   1.  Patient will demonstrate independent use of home exercise program to maintain progress from in clinic treatments.  Goal status: Met   LONG TERM GOALS: (target dates for all long term goals are 12 weeks  12/17/2023 )   1. Patient will demonstrate/report pain at worst less than or equal to 2/10 to facilitate minimal limitation in daily activity secondary to  pain symptoms.  Goal status: Ongoing 10/01/2023   2. Patient will demonstrate independent use of home exercise program to facilitate ability to maintain/progress functional gains from skilled physical therapy services.  Goal status: Ongoing 10/01/2023   3. Patient will demonstrate FOTO outcome > or = 53 % to indicate reduced disability due to condition.  Goal status: Ongoing 10/01/2023   4.  Patient will demonstrate Rt LE MMT 5/5 throughout to faciltiate usual transfers,  stairs, squatting at [facility] for daily life.   Goal status: Ongoing 10/01/2023   5.  Patient will demonstrate Rt knee AROM 0-120 deg to facilitate sitting, transfers, ambulation s deviation due to range.  Goal status: Ongoing 10/01/2023   6.  Patient will demonstrate ascending/descending stairs reciprocally s UE assist for community integration.   Goal status: Ongoing 10/01/2023   7.  Patient will demonstrate independent ambulation community distances > 300 ft for community integration.  Goal Status: Ongoing 10/01/2023   PLAN:  PT FREQUENCY: 1-2x/week  PT DURATION: 12 weeks  PLANNED INTERVENTIONS: Can include 40981- PT Re-evaluation, 97110-Therapeutic exercises, 97530- Therapeutic activity, 97112- Neuromuscular re-education, 97535- Self Care, 97140- Manual therapy, [phone]- Gait training, [phone]- Orthotic Fit/training, [phone]- Canalith repositioning, [id number]- Aquatic Therapy, 97014- Electrical stimulation (unattended), [id number]- Electrical stimulation (manual), [id number]- Vasopneumatic device, [id number]- Ultrasound, [id number]- Traction (mechanical), [id number]- Ionotophoresis 4mg /ml Dexamethasone, Patient/Family education, Balance training, Stair training, Taping, Dry Needling, Joint mobilization, Joint manipulation, Spinal manipulation, Spinal mobilization, Scar mobilization, Vestibular training, Visual/preceptual remediation/compensation, DME instructions, Cryotherapy, and Moist heat.  All performed as medically necessary.  All included unless contraindicated  PLAN FOR NEXT SESSION:   Compliant surface balance, quad strengthening as able.     Chyrel Masson, PT, DPT, OCS, ATC 10/14/23  12:35 PM

## 2023-10-16 ENCOUNTER — Ambulatory Visit (INDEPENDENT_AMBULATORY_CARE_PROVIDER_SITE_OTHER): Payer: Medicare PPO | Admitting: Orthopedic Surgery

## 2023-10-16 ENCOUNTER — Encounter: Payer: Medicare PPO | Admitting: Orthopedic Surgery

## 2023-10-16 ENCOUNTER — Encounter: Payer: Medicare PPO | Admitting: Rehabilitative and Restorative Service Providers"

## 2023-10-16 DIAGNOSIS — Z96651 Presence of right artificial knee joint: Secondary | ICD-10-CM

## 2023-10-18 NOTE — Progress Notes (Unsigned)
Post-Op Visit Note   Patient: Alexis Fox           Date of Birth: [birth year]           MRN: [id number] Visit Date: 10/16/2023 PCP: Thana Ates, MD   Assessment & Plan:  Chief Complaint:  Chief Complaint  Patient presents with   Right Knee - Routine Post Op    09/03/23 right TKA   Visit Diagnoses:  1. S/P total knee arthroplasty, right     Plan: [name] is a patient underwent right total knee replacement 09/03/2023.  Reports some tightness in the knee which is not unexpected.  Therapy 2 times a week.  Taking Tylenol for pain.  On examination no calf tenderness negative Homans.  Patella has good mobility.  Range of motion 0-1 12.  Overall she is doing well with strength and range of motion.  6-week return for final recheck.  Follow-Up Instructions: No follow-ups on file.   Orders:  No orders of the defined types were placed in this encounter.  No orders of the defined types were placed in this encounter.   Imaging: No results found.  PMFS History: Patient Active Problem List   Diagnosis Date Noted   Arthritis of knee, right 10/01/2023   DJD (degenerative joint disease) of knee 09/03/2023   S/P total knee arthroplasty, right 09/03/2023   Unilateral primary osteoarthritis, right knee 12/28/2022   Pain in left ankle and joints of left foot 09/13/2022   Hypersomnia, persistent 08/21/2022   Excessive daytime sleepiness 08/21/2022   MCI (mild cognitive impairment) 08/21/2022   History of breast cancer 08/21/2022   OSA on CPAP 08/21/2022   Pain in left shoulder 03/01/2021   Allergic rhinitis 02/21/2021   Anxiety 02/21/2021   Contact dermatitis due to plant 02/21/2021   Dysphagia 02/21/2021   Estrogen receptor negative status (ER-) 02/21/2021   Gastroesophageal reflux disease 02/21/2021   Gastroparesis 02/21/2021   Insomnia 02/21/2021   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   Morton's neuroma of left foot 02/21/2021   Personal history of  malignant neoplasm of breast 02/21/2021   Vitamin D deficiency 02/21/2021   Oth nondisplaced dens fracture, init for clos fx (HCC) 02/04/2020   S/P cervical spinal fusion 01/22/2020   Memory deficit 01/14/2020   Sacroiliac dysfunction 01/14/2020   Neck pain 11/05/2019   Body mass index (BMI) 25.0-25.9, adult 11/05/2019   Unilateral primary osteoarthritis, right hip 09/01/2019   Trochanteric bursitis, left hip 07/14/2019   S/P lumbar fusion 05/18/2019   OSA (obstructive sleep apnea) 10/28/2018   Pain managed using patient-controlled analgesia (PCA) 10/25/2018   Fall 10/24/2018   Fall from ladder 10/23/2018   DDD (degenerative disc disease), lumbosacral 09/15/2018   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   Central line complication 10/22/2016   Genetic testing 05/15/2016   Family history of breast cancer    Family history of brain cancer    Breast cancer of upper-outer quadrant of left female breast (HCC) 03/20/2016   Transaminitis 10/03/2015   Symptomatic cholelithiasis 10/03/2015   RUQ abdominal pain    Lumbosacral neuritis 11/10/2014   Lumbar radiculopathy 10/19/2014   Depression 06/24/2013   RLS (restless legs syndrome) 06/24/2013   Low back pain 05/19/2013   Past Medical History:  Diagnosis Date   Anxiety    Arthritis    knees   Breast cancer (HCC)    Breast cancer of upper-outer quadrant of left female breast (HCC) 03/20/2016   C2 cervical fracture (HCC)  10/22/18, following fall from ladder   Cervicalgia    Cholecystitis    Complication of anesthesia    BP "crashes" post op   Early cataracts, bilateral    Family history of brain cancer    Family history of breast cancer    Hot flashes    Personal history of chemotherapy    Personal history of radiation therapy    Restless leg syndrome    Sleep apnea 10/03/2018   wears CPAP    Family History  Problem Relation Age of Onset   Heart disease Mother    Pulmonary fibrosis Mother    Hypertension  Mother    Sleep apnea Mother    Cancer Father 27       astocytoma   Hypertension Brother    Lymphoma Maternal Aunt    Anesthesia problems Neg Hx     Past Surgical History:  Procedure Laterality Date   ANTERIOR CERVICAL DECOMP/DISCECTOMY FUSION N/A 01/22/2020   Procedure: ACDF - C2-C3;  Surgeon: Tia Alert, MD;  Location: [facility] OR;  Service: Neurosurgery;  Laterality: N/A;   BACK SURGERY     x2   BREAST BIOPSY     BREAST LUMPECTOMY Left 2018   BUNIONECTOMY     CHOLECYSTECTOMY N/A 10/04/2015   Procedure: LAPAROSCOPIC CHOLECYSTECTOMY WITH INTRAOPERATIVE CHOLANGIOGRAM;  Surgeon: Gaynelle Adu, MD;  Location: [facility] OR;  Service: General;  Laterality: N/A;   ELBOW SURGERY     right for epicondylitis 2010    ESOPHAGEAL MANOMETRY N/A 11/30/2020   Procedure: ESOPHAGEAL MANOMETRY (EM);  Surgeon: Kerin Salen, MD;  [facility];  Service: Gastroenterology;  Laterality: N/A;   FOOT SURGERY     1983 -tarsal tunnel release   IR GENERIC HISTORICAL  10/26/2016   IR CV LINE INJECTION 10/26/2016 [facility]   LUMBAR DISC SURGERY  04/04/2012   MASS EXCISION Left 02/06/2021   Procedure: EXCISION LEFT FOOT SOFT TISSUE BETWEEN SECOND AND THIRD AND THIRD AND FOURTH TOES;  Surgeon: Asencion Islam, DPM;  Location: [facility];  Service: Podiatry;  Laterality: Left;   PORT-A-CATH REMOVAL N/A 11/15/2016   Procedure: REMOVAL PORT-A-CATH;  Surgeon: Emelia Loron, MD;  Location: [facility];  Service: General;  Laterality: N/A;   PORTACATH PLACEMENT Right 04/02/2016   Procedure: INSERTION PORT-A-CATH WITH [place];  Surgeon: Emelia Loron, MD;  Location: [facility];  Service: General;  Laterality: Right;   RADIOACTIVE SEED GUIDED PARTIAL MASTECTOMY/AXILLARY SENTINEL NODE BIOPSY/AXILLARY NODE DISSECTION Left 09/12/2016   Procedure: LEFT BREAST SEED GUIDED LUMPECTOMY, LEFT AXILLARY SENTINEL NODE BIOPSY, LEFT SEED GUIDED AXILLARY NODE EXCISION( TARGETED AXILLARY  DISSECTION), BLUE DYE INJECTION;  Surgeon: Emelia Loron, MD;  Location: [facility];  Service: General;  Laterality: Left;   SACROILIAC JOINT FUSION Left 05/18/2020   Procedure: LEFT SACROILIAC JOINT FUSION;  Surgeon: Estill Bamberg, MD;  Location: [facility];  Service: Orthopedics;  Laterality: Left;   SPINAL FUSION  5/12   L5-S1   SPINAL FUSION  05/18/2019   L4-5   TARSAL TUNNEL RELEASE     TOTAL KNEE ARTHROPLASTY Right 09/03/2023   Procedure: RIGHT TOTAL KNEE ARTHROPLASTY;  Surgeon: Cammy Copa, MD;  Location[facility];  Service: Orthopedics;  Laterality: Right;   Social History   Occupational History   Not on file  Tobacco Use   Smoking status: Never   Smokeless tobacco: Never  Vaping Use   Vaping status: Never Used  Substance and Sexual Activity   Alcohol use: Yes  Alcohol/week: 1.0 standard drink of alcohol    Types: 1 Cans of beer per week    Comment: Rarely 1/month   Drug use: No   Sexual activity: Not Currently    Birth control/protection: Post-menopausal

## 2023-10-20 ENCOUNTER — Encounter: Payer: Self-pay | Admitting: Orthopedic Surgery

## 2023-10-21 ENCOUNTER — Ambulatory Visit (INDEPENDENT_AMBULATORY_CARE_PROVIDER_SITE_OTHER): Payer: Medicare PPO | Admitting: Rehabilitative and Restorative Service Providers"

## 2023-10-21 ENCOUNTER — Encounter: Payer: Self-pay | Admitting: Rehabilitative and Restorative Service Providers"

## 2023-10-21 DIAGNOSIS — M25561 Pain in right knee: Secondary | ICD-10-CM | POA: Diagnosis not present

## 2023-10-21 DIAGNOSIS — M25661 Stiffness of right knee, not elsewhere classified: Secondary | ICD-10-CM | POA: Diagnosis not present

## 2023-10-21 DIAGNOSIS — M6281 Muscle weakness (generalized): Secondary | ICD-10-CM

## 2023-10-21 DIAGNOSIS — R600 Localized edema: Secondary | ICD-10-CM | POA: Diagnosis not present

## 2023-10-21 DIAGNOSIS — R262 Difficulty in walking, not elsewhere classified: Secondary | ICD-10-CM

## 2023-10-21 DIAGNOSIS — G8929 Other chronic pain: Secondary | ICD-10-CM

## 2023-10-21 NOTE — Therapy (Signed)
OUTPATIENT PHYSICAL THERAPY TREATMENT   Patient Name: Alexis Fox MRN: [id number] DOB:1956/09/10, 67 y.o., female Today's Date: 10/21/2023  END OF SESSION:  PT End of Session - 10/21/23 1134     Visit Number 7    Number of Visits 24    Date for PT Re-Evaluation 12/17/23    Authorization Type [employer] $20 copay    Authorization Time Period $20 COPAYApproved  Authorization #[id number]  Tracking #[id number]  09/24/2023-12/17/2023    Authorization - Visit Number 7    Progress Note Due on Visit 10    PT Start Time 1134    PT Stop Time 1224    PT Time Calculation (min) 50 min    Activity Tolerance Patient tolerated treatment well    Behavior During Therapy [facility] for tasks assessed/performed                   Past Medical History:  Diagnosis Date   Anxiety    Arthritis    knees   Breast cancer (HCC)    Breast cancer of upper-outer quadrant of left female breast (HCC) 03/20/2016   C2 cervical fracture (HCC)    10/22/18, following fall from ladder   Cervicalgia    Cholecystitis    Complication of anesthesia    BP "crashes" post op   Early cataracts, bilateral    Family history of brain cancer    Family history of breast cancer    Hot flashes    Personal history of chemotherapy    Personal history of radiation therapy    Restless leg syndrome    Sleep apnea 10/03/2018   wears CPAP   Past Surgical History:  Procedure Laterality Date   ANTERIOR CERVICAL DECOMP/DISCECTOMY FUSION N/A 01/22/2020   Procedure: ACDF - C2-C3;  Surgeon: Tia Alert, MD;  Location: [facility] OR;  Service: Neurosurgery;  Laterality: N/A;   BACK SURGERY     x2   BREAST BIOPSY     BREAST LUMPECTOMY Left 2018   BUNIONECTOMY     CHOLECYSTECTOMY N/A 10/04/2015   Procedure: LAPAROSCOPIC CHOLECYSTECTOMY WITH INTRAOPERATIVE CHOLANGIOGRAM;  Surgeon: Gaynelle Adu, MD;  Location: [facility] OR;  Service: General;  Laterality: N/A;   ELBOW SURGERY     right for epicondylitis 2010    ESOPHAGEAL  MANOMETRY N/A 11/30/2020   Procedure: ESOPHAGEAL MANOMETRY (EM);  Surgeon: Kerin Salen, MD;  Location: [facility];  Service: Gastroenterology;  Laterality: N/A;   FOOT SURGERY     1983 -tarsal tunnel release   IR GENERIC HISTORICAL  10/26/2016   IR CV LINE INJECTION 10/26/2016 [facility]-INTERV RAD   LUMBAR DISC SURGERY  04/04/2012   MASS EXCISION Left 02/06/2021   Procedure: EXCISION LEFT FOOT SOFT TISSUE BETWEEN SECOND AND THIRD AND THIRD AND FOURTH TOES;  Surgeon: Asencion Islam, DPM;  Location: [facility];  Service: Podiatry;  Laterality: Left;   PORT-A-CATH REMOVAL N/A 11/15/2016   Procedure: REMOVAL PORT-A-CATH;  Surgeon: Emelia Loron, MD;  Location: [facility];  Service: General;  Laterality: N/A;   PORTACATH PLACEMENT Right 04/02/2016   Procedure: INSERTION PORT-A-CATH WITH [place];  Surgeon: Emelia Loron, MD;  Location: [facility];  Service: General;  Laterality: Right;   RADIOACTIVE SEED GUIDED PARTIAL MASTECTOMY/AXILLARY SENTINEL NODE BIOPSY/AXILLARY NODE DISSECTION Left 09/12/2016   Procedure: LEFT BREAST SEED GUIDED LUMPECTOMY, LEFT AXILLARY SENTINEL NODE BIOPSY, LEFT SEED GUIDED AXILLARY NODE EXCISION( TARGETED AXILLARY DISSECTION), BLUE DYE INJECTION;  Surgeon: Emelia Loron, MD;  Location: [facility];  Service: General;  Laterality: Left;   SACROILIAC JOINT FUSION Left 05/18/2020   Procedure: LEFT SACROILIAC JOINT FUSION;  Surgeon: Estill Bamberg, MD;  Location: [facility] OR;  Service: Orthopedics;  Laterality: Left;   SPINAL FUSION  5/12   L5-S1   SPINAL FUSION  05/18/2019   L4-5   TARSAL TUNNEL RELEASE     TOTAL KNEE ARTHROPLASTY Right 09/03/2023   Procedure: RIGHT TOTAL KNEE ARTHROPLASTY;  Surgeon: Cammy Copa, MD;  Location: [facility] OR;  Service: Orthopedics;  Laterality: Right;   Patient Active Problem List   Diagnosis Date Noted   Arthritis of knee, right 10/01/2023   DJD (degenerative joint disease) of knee  09/03/2023   S/P total knee arthroplasty, right 09/03/2023   Unilateral primary osteoarthritis, right knee 12/28/2022   Pain in left ankle and joints of left foot 09/13/2022   Hypersomnia, persistent 08/21/2022   Excessive daytime sleepiness 08/21/2022   MCI (mild cognitive impairment) 08/21/2022   History of breast cancer 08/21/2022   OSA on CPAP 08/21/2022   Pain in left shoulder 03/01/2021   Allergic rhinitis 02/21/2021   Anxiety 02/21/2021   Contact dermatitis due to plant 02/21/2021   Dysphagia 02/21/2021   Estrogen receptor negative status (ER-) 02/21/2021   Gastroesophageal reflux disease 02/21/2021   Gastroparesis 02/21/2021   Insomnia 02/21/2021   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   Morton's neuroma of left foot 02/21/2021   Personal history of malignant neoplasm of breast 02/21/2021   Vitamin D deficiency 02/21/2021   Oth nondisplaced dens fracture, init for clos fx (HCC) 02/04/2020   S/P cervical spinal fusion 01/22/2020   Memory deficit 01/14/2020   Sacroiliac dysfunction 01/14/2020   Neck pain 11/05/2019   Body mass index (BMI) 25.0-25.9, adult 11/05/2019   Unilateral primary osteoarthritis, right hip 09/01/2019   Trochanteric bursitis, left hip 07/14/2019   S/P lumbar fusion 05/18/2019   OSA (obstructive sleep apnea) 10/28/2018   Pain managed using patient-controlled analgesia (PCA) 10/25/2018   Fall 10/24/2018   Fall from ladder 10/23/2018   DDD (degenerative disc disease), lumbosacral 09/15/2018   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   Central line complication 10/22/2016   Genetic testing 05/15/2016   Family history of breast cancer    Family history of brain cancer    Breast cancer of upper-outer quadrant of left female breast (HCC) 03/20/2016   Transaminitis 10/03/2015   Symptomatic cholelithiasis 10/03/2015   RUQ abdominal pain    Lumbosacral neuritis 11/10/2014   Lumbar radiculopathy 10/19/2014   Depression  06/24/2013   RLS (restless legs syndrome) 06/24/2013   Low back pain 05/19/2013    PCP: Thana Ates MD  REFERRING PROVIDER: Julieanne Cotton, PA-C   REFERRING DIAG: [phone] (ICD-10-CM) - Chronic pain of right knee   THERAPY DIAG:  Chronic pain of right knee  Muscle weakness (generalized)  Stiffness of right knee, not elsewhere classified  Difficulty in walking, not elsewhere classified  Localized edema  Rationale for Evaluation and Treatment: Rehabilitation  ONSET DATE: 09/03/2023 Rt TKA  SUBJECTIVE:   SUBJECTIVE STATEMENT: Pt indicated going to gym and doing bike and elliptical in time since last visit.  Pt asked about swelling in leg (was there but not abnormal).   PERTINENT HISTORY: breast CA, ACDF, lumbar fusion, history of C2 fracture,   PAIN:  NPRS scale: 1/10 Pain location: Rt knee  Pain description: stiffness, tight, achy Aggravating factors: prolonged positioning, transfers (car transfers), increased WB activity, getting comfortable to sleep.  Relieving factors: pain medicine,  ice   PRECAUTIONS: None  WEIGHT BEARING RESTRICTIONS: No  FALLS:  Has patient fallen in last 6 months? 1 fall day of surgery in bathroom at hospital  No fear of falling reported.   LIVING ENVIRONMENT: Lives in: House/apartment Stairs: 1 step to entry with bilateral rails.  Has following equipment at home: Cane, walker  OCCUPATION: No work  PLOF: Independent, likes to camp in RV trailer, has dogs.  Has silver sneakers  PATIENT GOALS: Reduce pain, get back to walking  OBJECTIVE:   PATIENT SURVEYS:  10/21/2023:  FOTO update:  55  09/24/2023 FOTO intake: 40   predicted:  53  COGNITION: 09/24/2023 Overall cognitive status: WFL    SENSATION: 09/24/2023 None tested today  EDEMA:  09/24/2023 Localized edema noted visually Rt knee/lower leg  MUSCLE LENGTH: 09/24/2023 None tested today  POSTURE:  09/24/2023 Weight deviation to Lt in standing.    PALPATION: 10/01/2023:  tenderness to palpation quad tendon and vastus medialis just proximal to tendon.  Pain lateral knee with flexion motions.  Pt able to engage quads with some pain but no divot or indention in quad muscle felt.   09/24/2023 General tenderness to touch.   LOWER EXTREMITY ROM:   ROM Right 09/24/2023 Right 10/03/2023 Right 10/21/2023  Hip flexion     Hip extension     Hip abduction     Hip adduction     Hip internal rotation     Hip external rotation     Knee flexion 111 AROM in supine heel slide Seated heel slide A: 94* P: 99* Supine AA: 104* Strap/ball  Supine heel slide 112 AROM  Knee extension 0 AROM in sitting LAQ Seated heel slide A: 0* 0 AROm  Ankle dorsiflexion     Ankle plantarflexion     Ankle inversion     Ankle eversion      (Blank rows = not tested)  LOWER EXTREMITY MMT:  MMT Right 09/24/2023 Left 09/24/2023 Right 10/14/2023 Left 10/14/2023  Hip flexion 5/5 5/5    Hip extension      Hip abduction      Hip adduction      Hip internal rotation      Hip external rotation      Knee flexion 4/5 5/5 4/5   Knee extension 4/5 5/5 5/5 35.2, 32 lbs 5/5 50, 47 lbs  Ankle dorsiflexion 5/5 5/5    Ankle plantarflexion      Ankle inversion      Ankle eversion       (Blank rows = not tested)  LOWER EXTREMITY SPECIAL TESTS:  09/24/2023 None tested today  FUNCTIONAL TESTS:  09/24/2023 18 inch chair transfer: able to perform on 1st attempt but with increased difficulty Lt SLS:  unable Rt SLS:  unable   GAIT: 09/24/2023 Mod independent c SPC in Lt UE.  Reduced stance on Rt leg with weight shift off Rt.  Reduced toe off progression.  TODAY'S TREATMENT                                                                          DATE: 10/21/2023: Therapeutic Exercise:   SciFit bike seat 9, LE only Lvl 4.0 10 mins  Knee extension machine double leg, single Rt leg lowering slowly 2 x 10 10 lbs  Seated knee flexion machine Rt leg only 2 x 10 10 lbs    TherActivity ( to improve transfers, ambulation, stairs) Lateral step up/down on real step in clinic Rt leg WB x 10 (cues for home) Flight of stairs c reciprocal gait pattern and single rail assist x 1 up/down  Neuro Re-ed Tandem stance on foam 1 min x 1 bilateral with occasional HHA on bar Lateral stepping 3 cones in arch x 6 each, performed bilaterally  Vaso  right knee 34* medium compression with elevation for 10 min.  TODAY'S TREATMENT                                                                          DATE: 10/14/2023: Therapeutic Exercise:  SciFit bike seat 10, LE only Lvl 2 10.5 mins Seated Rt knee LAQ x 15 with end range pauses  TherActivity ( to improve transfers, ambulation, stairs) Step on over and down 4 inch step with single hand rail assist x 15  Leg press double leg 68 lbs x 15, single leg Rt 31 lbs x 15, x 6 (stopped due to discomfort in kee)  Neuro Re-ed SLS on black mat with contralateral leg corner touching x 6 each, performed bilaterally in // bars with occasional HHA Retro step x 12 bilateral   Vaso  right knee 34* medium compression with elevation for 10 min.  TODAY'S TREATMENT                                                                          DATE: 10/07/2023: Therapeutic Exercise:  SciFit bike seat 10, LE only Lvl 2 8 mins Incline gastroc stretch 30 sec x 3 bilaterally Seated quad set 5 sec hold x 10 Rt leg Seated quad set c SLR Rt 2x 10   TherActivity ( to improve transfers, ambulation, stairs) Leg press double leg 62 lbs x 15, single leg Rt 25 lbs x 15  Neuro Re-ed Tandem stance on foam in // bars with occasional HHA 1 min x 1 bilateral  Tandem ambulation fwd/back on foam in // bars with occasional HHA 6 ft x 5 each way    Vaso  right knee 34* medium  compression with elevation for 10 min.    PATIENT EDUCATION:  09/24/2023 Education details: HEP, POC Person educated: Patient Education method:  Explanation, Demonstration, Verbal cues, and Handouts Education comprehension: verbalized understanding, returned demonstration, and verbal cues required  HOME EXERCISE PROGRAM: Access Code: [email or web address] URL: [email or web address] Date: 09/26/2023 Prepared by: Vladimir Faster  Exercises - Supine Heel Slide  - 3-5 x daily - 7 x weekly - 1-2 sets - 10 reps - 2 hold - Seated Quad Set  - 3-5 x daily - 7 x weekly - 1 sets - 10 reps - 5 hold - Seated Straight Leg Heel Taps  - 1-2 x daily - 7 x weekly - 3 sets - 10 reps - Seated Long Arc Quad  - 3-5 x daily - 7 x weekly - 1-2 sets - 10 reps - 2 hold - Sit to Stand  - 3 x daily - 7 x weekly - 1 sets - 10 reps - Seated Knee Flexion Extension AROM   - 3 x daily - 7 x weekly - 1-2 sets - 10 reps - 5 seconds hold - Supine Heel Slide with Strap  - 3 x daily - 7 x weekly - 1-2 sets - 10 reps - 5 seconds hold - Active Straight Leg Raise with Quad Set  - 3 x daily - 7 x weekly - 1-2 sets - 10 reps - 5 seconds hold  Patient Education - Leg Elevation for Swelling  ASSESSMENT:  CLINICAL IMPRESSION: Stair navigation improving in WB acceptance and control.  Spent time reviewing gym equipment use for visits on non therapy days.  Due to improvements, may trial of reduced frequency of in clinic visits planned over the next month. Pt was in agreement with plan.   OBJECTIVE IMPAIRMENTS: Abnormal gait, decreased activity tolerance, decreased balance, decreased coordination, decreased endurance, decreased mobility, difficulty walking, decreased ROM, decreased strength, hypomobility, increased edema, increased fascial restrictions, impaired perceived functional ability, impaired flexibility, improper body mechanics, and pain.   ACTIVITY LIMITATIONS: carrying, lifting, bending, sitting, standing, squatting,  sleeping, stairs, transfers, bed mobility, and locomotion level  PARTICIPATION LIMITATIONS: meal prep, cleaning, laundry, interpersonal relationship, driving, shopping, community activity, and yard work  PERSONAL FACTORS:  breast CA, ACDF, lumbar fusion, history of C2 fracture,   are also affecting patient's functional outcome.   REHAB POTENTIAL: Good  CLINICAL DECISION MAKING: Stable/uncomplicated  EVALUATION COMPLEXITY: Low   GOALS: Goals reviewed with patient? Yes  SHORT TERM GOALS: (target date for Short term goals are 3 weeks 10/15/2023)   1.  Patient will demonstrate independent use of home exercise program to maintain progress from in clinic treatments.  Goal status: Met   LONG TERM GOALS: (target dates for all long term goals are 12 weeks  12/17/2023 )   1. Patient will demonstrate/report pain at worst less than or equal to 2/10 to facilitate minimal limitation in daily activity secondary to pain symptoms.  Goal status: Ongoing 10/01/2023   2. Patient will demonstrate independent use of home exercise program to facilitate ability to maintain/progress functional gains from skilled physical therapy services.  Goal status: Ongoing 10/01/2023   3. Patient will demonstrate FOTO outcome > or = 53 % to indicate reduced disability due to condition.  Goal status: Ongoing 10/01/2023   4.  Patient will demonstrate Rt LE MMT 5/5 throughout to faciltiate usual transfers, stairs, squatting at [facility] for daily life.   Goal status: Ongoing 10/01/2023   5.  Patient will demonstrate Rt knee AROM 0-120 deg to facilitate sitting, transfers, ambulation s deviation due to range.  Goal status: Ongoing 10/01/2023   6.  Patient will demonstrate ascending/descending stairs  reciprocally s UE assist for community integration.   Goal status: Ongoing 10/01/2023   7.  Patient will demonstrate independent ambulation community distances > 300 ft for community integration.  Goal Status: Ongoing  10/01/2023   PLAN:  PT FREQUENCY: 1-2x/week  PT DURATION: 12 weeks  PLANNED INTERVENTIONS: Can include 16109- PT Re-evaluation, 97110-Therapeutic exercises, 97530- Therapeutic activity, 97112- Neuromuscular re-education, 97535- Self Care, 97140- Manual therapy, [phone]- Gait training, [phone]- Orthotic Fit/training, [phone]- Canalith repositioning, [id number]- Aquatic Therapy, 97014- Electrical stimulation (unattended), [id number]- Electrical stimulation (manual), [id number]- Vasopneumatic device, [id number]- Ultrasound, [id number]- Traction (mechanical), [id number]- Ionotophoresis 4mg /ml Dexamethasone, Patient/Family education, Balance training, Stair training, Taping, Dry Needling, Joint mobilization, Joint manipulation, Spinal manipulation, Spinal mobilization, Scar mobilization, Vestibular training, Visual/preceptual remediation/compensation, DME instructions, Cryotherapy, and Moist heat.  All performed as medically necessary.  All included unless contraindicated  PLAN FOR NEXT SESSION:   Plan to continue WB strengthening, balance improvements.     Chyrel Masson, PT, DPT, OCS, ATC 10/21/23  12:23 PM

## 2023-10-23 ENCOUNTER — Ambulatory Visit: Payer: Medicare PPO | Admitting: Rehabilitative and Restorative Service Providers"

## 2023-10-23 ENCOUNTER — Encounter: Payer: Self-pay | Admitting: Rehabilitative and Restorative Service Providers"

## 2023-10-23 DIAGNOSIS — M6281 Muscle weakness (generalized): Secondary | ICD-10-CM | POA: Diagnosis not present

## 2023-10-23 DIAGNOSIS — R600 Localized edema: Secondary | ICD-10-CM | POA: Diagnosis not present

## 2023-10-23 DIAGNOSIS — R262 Difficulty in walking, not elsewhere classified: Secondary | ICD-10-CM | POA: Diagnosis not present

## 2023-10-23 DIAGNOSIS — M25661 Stiffness of right knee, not elsewhere classified: Secondary | ICD-10-CM | POA: Diagnosis not present

## 2023-10-23 DIAGNOSIS — M25561 Pain in right knee: Secondary | ICD-10-CM

## 2023-10-23 DIAGNOSIS — G8929 Other chronic pain: Secondary | ICD-10-CM | POA: Diagnosis not present

## 2023-10-23 NOTE — Therapy (Signed)
OUTPATIENT PHYSICAL THERAPY TREATMENT   Patient Name: Rosezetta Sammut MRN: [id number] DOB:May 04, 1956, 67 y.o., female Today's Date: 10/23/2023  END OF SESSION:  PT End of Session - 10/23/23 1105     Visit Number 8    Number of Visits 24    Date for PT Re-Evaluation 12/17/23    Authorization Type [employer] $20 copay    Authorization Time Period $20 COPAYApproved  Authorization #[id number]  Tracking #[id number]  09/24/2023-12/17/2023    Authorization - Visit Number 8    Progress Note Due on Visit 10    PT Start Time 1101    PT Stop Time 1150    PT Time Calculation (min) 49 min    Activity Tolerance Patient tolerated treatment well    Behavior During Therapy [facility] for tasks assessed/performed                    Past Medical History:  Diagnosis Date   Anxiety    Arthritis    knees   Breast cancer (HCC)    Breast cancer of upper-outer quadrant of left female breast (HCC) 03/20/2016   C2 cervical fracture (HCC)    10/22/18, following fall from ladder   Cervicalgia    Cholecystitis    Complication of anesthesia    BP "crashes" post op   Early cataracts, bilateral    Family history of brain cancer    Family history of breast cancer    Hot flashes    Personal history of chemotherapy    Personal history of radiation therapy    Restless leg syndrome    Sleep apnea 10/03/2018   wears CPAP   Past Surgical History:  Procedure Laterality Date   ANTERIOR CERVICAL DECOMP/DISCECTOMY FUSION N/A 01/22/2020   Procedure: ACDF - C2-C3;  Surgeon: Tia Alert, MD;  Location: [facility] OR;  Service: Neurosurgery;  Laterality: N/A;   BACK SURGERY     x2   BREAST BIOPSY     BREAST LUMPECTOMY Left 2018   BUNIONECTOMY     CHOLECYSTECTOMY N/A 10/04/2015   Procedure: LAPAROSCOPIC CHOLECYSTECTOMY WITH INTRAOPERATIVE CHOLANGIOGRAM;  Surgeon: Gaynelle Adu, MD;  Location: [facility] OR;  Service: General;  Laterality: N/A;   ELBOW SURGERY     right for epicondylitis 2010    ESOPHAGEAL  MANOMETRY N/A 11/30/2020   Procedure: ESOPHAGEAL MANOMETRY (EM);  Surgeon: Kerin Salen, MD;  Location: [facility];  Service: Gastroenterology;  Laterality: N/A;   FOOT SURGERY     1983 -tarsal tunnel release   IR GENERIC HISTORICAL  10/26/2016   IR CV LINE INJECTION 10/26/2016 [facility]-INTERV RAD   LUMBAR DISC SURGERY  04/04/2012   MASS EXCISION Left 02/06/2021   Procedure: EXCISION LEFT FOOT SOFT TISSUE BETWEEN SECOND AND THIRD AND THIRD AND FOURTH TOES;  Surgeon: Asencion Islam, DPM;  Location: [facility];  Service: Podiatry;  Laterality: Left;   PORT-A-CATH REMOVAL N/A 11/15/2016   Procedure: REMOVAL PORT-A-CATH;  Surgeon: Emelia Loron, MD;  Location: [facility];  Service: General;  Laterality: N/A;   PORTACATH PLACEMENT Right 04/02/2016   Procedure: INSERTION PORT-A-CATH WITH [place];  Surgeon: Emelia Loron, MD;  Location: [facility];  Service: General;  Laterality: Right;   RADIOACTIVE SEED GUIDED PARTIAL MASTECTOMY/AXILLARY SENTINEL NODE BIOPSY/AXILLARY NODE DISSECTION Left 09/12/2016   Procedure: LEFT BREAST SEED GUIDED LUMPECTOMY, LEFT AXILLARY SENTINEL NODE BIOPSY, LEFT SEED GUIDED AXILLARY NODE EXCISION( TARGETED AXILLARY DISSECTION), BLUE DYE INJECTION;  Surgeon: Emelia Loron, MD;  Location: [facility];  Service: General;  Laterality: Left;   SACROILIAC JOINT FUSION Left 05/18/2020   Procedure: LEFT SACROILIAC JOINT FUSION;  Surgeon: Estill Bamberg, MD;  Location: [facility] OR;  Service: Orthopedics;  Laterality: Left;   SPINAL FUSION  5/12   L5-S1   SPINAL FUSION  05/18/2019   L4-5   TARSAL TUNNEL RELEASE     TOTAL KNEE ARTHROPLASTY Right 09/03/2023   Procedure: RIGHT TOTAL KNEE ARTHROPLASTY;  Surgeon: Cammy Copa, MD;  Location: [facility] OR;  Service: Orthopedics;  Laterality: Right;   Patient Active Problem List   Diagnosis Date Noted   Arthritis of knee, right 10/01/2023   DJD (degenerative joint disease) of knee  09/03/2023   S/P total knee arthroplasty, right 09/03/2023   Unilateral primary osteoarthritis, right knee 12/28/2022   Pain in left ankle and joints of left foot 09/13/2022   Hypersomnia, persistent 08/21/2022   Excessive daytime sleepiness 08/21/2022   MCI (mild cognitive impairment) 08/21/2022   History of breast cancer 08/21/2022   OSA on CPAP 08/21/2022   Pain in left shoulder 03/01/2021   Allergic rhinitis 02/21/2021   Anxiety 02/21/2021   Contact dermatitis due to plant 02/21/2021   Dysphagia 02/21/2021   Estrogen receptor negative status (ER-) 02/21/2021   Gastroesophageal reflux disease 02/21/2021   Gastroparesis 02/21/2021   Insomnia 02/21/2021   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   Morton's neuroma of left foot 02/21/2021   Personal history of malignant neoplasm of breast 02/21/2021   Vitamin D deficiency 02/21/2021   Oth nondisplaced dens fracture, init for clos fx (HCC) 02/04/2020   S/P cervical spinal fusion 01/22/2020   Memory deficit 01/14/2020   Sacroiliac dysfunction 01/14/2020   Neck pain 11/05/2019   Body mass index (BMI) 25.0-25.9, adult 11/05/2019   Unilateral primary osteoarthritis, right hip 09/01/2019   Trochanteric bursitis, left hip 07/14/2019   S/P lumbar fusion 05/18/2019   OSA (obstructive sleep apnea) 10/28/2018   Pain managed using patient-controlled analgesia (PCA) 10/25/2018   Fall 10/24/2018   Fall from ladder 10/23/2018   DDD (degenerative disc disease), lumbosacral 09/15/2018   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   Central line complication 10/22/2016   Genetic testing 05/15/2016   Family history of breast cancer    Family history of brain cancer    Breast cancer of upper-outer quadrant of left female breast (HCC) 03/20/2016   Transaminitis 10/03/2015   Symptomatic cholelithiasis 10/03/2015   RUQ abdominal pain    Lumbosacral neuritis 11/10/2014   Lumbar radiculopathy 10/19/2014   Depression  06/24/2013   RLS (restless legs syndrome) 06/24/2013   Low back pain 05/19/2013    PCP: Thana Ates MD  REFERRING PROVIDER: Julieanne Cotton, PA-C   REFERRING DIAG: [phone] (ICD-10-CM) - Chronic pain of right knee   THERAPY DIAG:  Chronic pain of right knee  Muscle weakness (generalized)  Stiffness of right knee, not elsewhere classified  Difficulty in walking, not elsewhere classified  Localized edema  Rationale for Evaluation and Treatment: Rehabilitation  ONSET DATE: 09/03/2023 Rt TKA  SUBJECTIVE:   SUBJECTIVE STATEMENT: Pt indicated 0/10 pain today upon arrival.  Reported last night was trouble with pain.  Reported tightness upon arrival.   PERTINENT HISTORY: breast CA, ACDF, lumbar fusion, history of C2 fracture,   PAIN:  NPRS scale: 0/10 Pain location: Rt knee  Pain description: stiffness, tight, achy Aggravating factors: prolonged positioning, transfers (car transfers), increased WB activity, getting comfortable to sleep.  Relieving factors: pain medicine, ice   PRECAUTIONS: None  WEIGHT BEARING RESTRICTIONS: No  FALLS:  Has patient fallen in last 6 months? 1 fall day of surgery in bathroom at hospital  No fear of falling reported.   LIVING ENVIRONMENT: Lives in: House/apartment Stairs: 1 step to entry with bilateral rails.  Has following equipment at home: Cane, walker  OCCUPATION: No work  PLOF: Independent, likes to camp in RV trailer, has dogs.  Has silver sneakers  PATIENT GOALS: Reduce pain, get back to walking  OBJECTIVE:   PATIENT SURVEYS:  10/21/2023:  FOTO update:  55  09/24/2023 FOTO intake: 40   predicted:  53  COGNITION: 09/24/2023 Overall cognitive status: WFL    SENSATION: 09/24/2023 None tested today  EDEMA:  09/24/2023 Localized edema noted visually Rt knee/lower leg  MUSCLE LENGTH: 09/24/2023 None tested today  POSTURE:  09/24/2023 Weight deviation to Lt in standing.   PALPATION: 10/01/2023:   tenderness to palpation quad tendon and vastus medialis just proximal to tendon.  Pain lateral knee with flexion motions.  Pt able to engage quads with some pain but no divot or indention in quad muscle felt.   09/24/2023 General tenderness to touch.   LOWER EXTREMITY ROM:   ROM Right 09/24/2023 Right 10/03/2023 Right 10/21/2023  Hip flexion     Hip extension     Hip abduction     Hip adduction     Hip internal rotation     Hip external rotation     Knee flexion 111 AROM in supine heel slide Seated heel slide A: 94* P: 99* Supine AA: 104* Strap/ball  Supine heel slide 112 AROM  Knee extension 0 AROM in sitting LAQ Seated heel slide A: 0* 0 AROm  Ankle dorsiflexion     Ankle plantarflexion     Ankle inversion     Ankle eversion      (Blank rows = not tested)  LOWER EXTREMITY MMT:  MMT Right 09/24/2023 Left 09/24/2023 Right 10/14/2023 Left 10/14/2023  Hip flexion 5/5 5/5    Hip extension      Hip abduction      Hip adduction      Hip internal rotation      Hip external rotation      Knee flexion 4/5 5/5 4/5   Knee extension 4/5 5/5 5/5 35.2, 32 lbs 5/5 50, 47 lbs  Ankle dorsiflexion 5/5 5/5    Ankle plantarflexion      Ankle inversion      Ankle eversion       (Blank rows = not tested)  LOWER EXTREMITY SPECIAL TESTS:  09/24/2023 None tested today  FUNCTIONAL TESTS:  09/24/2023 18 inch chair transfer: able to perform on 1st attempt but with increased difficulty Lt SLS:  unable Rt SLS:  unable   GAIT: 09/24/2023 Mod independent c SPC in Lt UE.  Reduced stance on Rt leg with weight shift off Rt.  Reduced toe off progression.  TODAY'S TREATMENT                                                                          DATE: 10/23/2023: Therapeutic Exercise:  Recumbent bike seat 4 ROM 8  mins lvl 1.0 Seated LAQ Rt leg with end range pauses 2 x 15 Supine heel slide Rt AROM 5 sec, quad set 5 sec hold x 15  TherActivity ( to improve transfers, ambulation, stairs) Leg press double leg 68 lbs x 15, single leg Rt  x 15 31 lbs Flight of stairs c reciprocal gait pattern without hand assist  x 2 up/down  Neuro Re-ed Fitter rocker/board light touch fwd/back x 20 with occasional to moderate HHA on bar SLS on black mat with contralateral corner touching x 6 each, performed bilaterally  Vaso  right knee 34* medium compression with elevation for 10 min.  TODAY'S TREATMENT                                                                          DATE: 10/21/2023: Therapeutic Exercise:  SciFit bike seat 9, LE only Lvl 4.0 10 mins  Knee extension machine double leg, single Rt leg lowering slowly 2 x 10 10 lbs  Seated knee flexion machine Rt leg only 2 x 10 10 lbs    TherActivity ( to improve transfers, ambulation, stairs) Lateral step up/down on real step in clinic Rt leg WB x 10 (cues for home) Flight of stairs c reciprocal gait pattern and single rail assist x 1 up/down  Neuro Re-ed Tandem stance on foam 1 min x 1 bilateral with occasional HHA on bar Lateral stepping 3 cones in arch x 6 each, performed bilaterally  Vaso  right knee 34* medium compression with elevation for 10 min.  TODAY'S TREATMENT                                                                          DATE: 10/14/2023: Therapeutic Exercise:  SciFit bike seat 10, LE only Lvl 2 10.5 mins Seated Rt knee LAQ x 15 with end range pauses  TherActivity ( to improve transfers, ambulation, stairs) Step on over and down 4 inch step with single hand rail assist x 15  Leg press double leg 68 lbs x 15, single leg Rt 31 lbs x 15, x 6 (stopped due to discomfort in kee)  Neuro Re-ed SLS on black mat with contralateral leg corner touching x 6 each, performed bilaterally in // bars with occasional HHA Retro step x 12  bilateral   Vaso  right knee 34* medium compression with elevation for 10 min.  TODAY'S TREATMENT  DATE: 10/07/2023: Therapeutic Exercise:  SciFit bike seat 10, LE only Lvl 2 8 mins Incline gastroc stretch 30 sec x 3 bilaterally Seated quad set 5 sec hold x 10 Rt leg Seated quad set c SLR Rt 2x 10   TherActivity ( to improve transfers, ambulation, stairs) Leg press double leg 62 lbs x 15, single leg Rt 25 lbs x 15  Neuro Re-ed Tandem stance on foam in // bars with occasional HHA 1 min x 1 bilateral  Tandem ambulation fwd/back on foam in // bars with occasional HHA 6 ft x 5 each way    Vaso  right knee 34* medium compression with elevation for 10 min.    PATIENT EDUCATION:  09/24/2023 Education details: HEP, POC Person educated: Patient Education method: Programmer, multimedia, Demonstration, Verbal cues, and Handouts Education comprehension: verbalized understanding, returned demonstration, and verbal cues required  HOME EXERCISE PROGRAM: Access Code: [email or web address] URL: [email or web address] Date: 09/26/2023 Prepared by: Vladimir Faster  Exercises - Supine Heel Slide  - 3-5 x daily - 7 x weekly - 1-2 sets - 10 reps - 2 hold - Seated Quad Set  - 3-5 x daily - 7 x weekly - 1 sets - 10 reps - 5 hold - Seated Straight Leg Heel Taps  - 1-2 x daily - 7 x weekly - 3 sets - 10 reps - Seated Long Arc Quad  - 3-5 x daily - 7 x weekly - 1-2 sets - 10 reps - 2 hold - Sit to Stand  - 3 x daily - 7 x weekly - 1 sets - 10 reps - Seated Knee Flexion Extension AROM   - 3 x daily - 7 x weekly - 1-2 sets - 10 reps - 5 seconds hold - Supine Heel Slide with Strap  - 3 x daily - 7 x weekly - 1-2 sets - 10 reps - 5 seconds hold - Active Straight Leg Raise with Quad Set  - 3 x daily - 7 x weekly - 1-2 sets - 10 reps - 5 seconds hold  Patient Education - Leg Elevation for Swelling  ASSESSMENT:  CLINICAL  IMPRESSION: Fatigue was noted in strengthening, particularly on single leg press activity.  Adjusted after activity to be more movement and NWB focused to help recover.  Continued skilled PT services warranted at this time.   OBJECTIVE IMPAIRMENTS: Abnormal gait, decreased activity tolerance, decreased balance, decreased coordination, decreased endurance, decreased mobility, difficulty walking, decreased ROM, decreased strength, hypomobility, increased edema, increased fascial restrictions, impaired perceived functional ability, impaired flexibility, improper body mechanics, and pain.   ACTIVITY LIMITATIONS: carrying, lifting, bending, sitting, standing, squatting, sleeping, stairs, transfers, bed mobility, and locomotion level  PARTICIPATION LIMITATIONS: meal prep, cleaning, laundry, interpersonal relationship, driving, shopping, community activity, and yard work  PERSONAL FACTORS:  breast CA, ACDF, lumbar fusion, history of C2 fracture,   are also affecting patient's functional outcome.   REHAB POTENTIAL: Good  CLINICAL DECISION MAKING: Stable/uncomplicated  EVALUATION COMPLEXITY: Low   GOALS: Goals reviewed with patient? Yes  SHORT TERM GOALS: (target date for Short term goals are 3 weeks 10/15/2023)   1.  Patient will demonstrate independent use of home exercise program to maintain progress from in clinic treatments.  Goal status: Met   LONG TERM GOALS: (target dates for all long term goals are 12 weeks  12/17/2023 )   1. Patient will demonstrate/report pain at worst less than or equal to 2/10 to facilitate minimal limitation in daily activity secondary to pain symptoms.  Goal status:  Ongoing 10/01/2023   2. Patient will demonstrate independent use of home exercise program to facilitate ability to maintain/progress functional gains from skilled physical therapy services.  Goal status: Ongoing 10/01/2023   3. Patient will demonstrate FOTO outcome > or = 53 % to indicate reduced  disability due to condition.  Goal status: Ongoing 10/01/2023   4.  Patient will demonstrate Rt LE MMT 5/5 throughout to faciltiate usual transfers, stairs, squatting at [facility] for daily life.   Goal status: Ongoing 10/01/2023   5.  Patient will demonstrate Rt knee AROM 0-120 deg to facilitate sitting, transfers, ambulation s deviation due to range.  Goal status: Ongoing 10/01/2023   6.  Patient will demonstrate ascending/descending stairs reciprocally s UE assist for community integration.   Goal status: Ongoing 10/01/2023   7.  Patient will demonstrate independent ambulation community distances > 300 ft for community integration.  Goal Status: Ongoing 10/01/2023   PLAN:  PT FREQUENCY: 1-2x/week  PT DURATION: 12 weeks  PLANNED INTERVENTIONS: Can include 30865- PT Re-evaluation, 97110-Therapeutic exercises, 97530- Therapeutic activity, 97112- Neuromuscular re-education, 97535- Self Care, 97140- Manual therapy, [phone]- Gait training, [phone]- Orthotic Fit/training, [phone]- Canalith repositioning, [id number]- Aquatic Therapy, 97014- Electrical stimulation (unattended), [id number]- Electrical stimulation (manual), [id number]- Vasopneumatic device, [id number]- Ultrasound, [id number]- Traction (mechanical), [id number]- Ionotophoresis 4mg /ml Dexamethasone, Patient/Family education, Balance training, Stair training, Taping, Dry Needling, Joint mobilization, Joint manipulation, Spinal manipulation, Spinal mobilization, Scar mobilization, Vestibular training, Visual/preceptual remediation/compensation, DME instructions, Cryotherapy, and Moist heat.  All performed as medically necessary.  All included unless contraindicated  PLAN FOR NEXT SESSION:   Strengthening, endurance improvements. Dynamic balance/compliant surface balance.    Chyrel Masson, PT, DPT, OCS, ATC 10/23/23  11:35 AM

## 2023-10-24 DIAGNOSIS — M47816 Spondylosis without myelopathy or radiculopathy, lumbar region: Secondary | ICD-10-CM | POA: Diagnosis not present

## 2023-10-30 ENCOUNTER — Encounter: Payer: Self-pay | Admitting: Rehabilitative and Restorative Service Providers"

## 2023-10-30 ENCOUNTER — Ambulatory Visit: Payer: Medicare PPO | Admitting: Rehabilitative and Restorative Service Providers"

## 2023-10-30 DIAGNOSIS — M25561 Pain in right knee: Secondary | ICD-10-CM | POA: Diagnosis not present

## 2023-10-30 DIAGNOSIS — M25661 Stiffness of right knee, not elsewhere classified: Secondary | ICD-10-CM | POA: Diagnosis not present

## 2023-10-30 DIAGNOSIS — R600 Localized edema: Secondary | ICD-10-CM

## 2023-10-30 DIAGNOSIS — M6281 Muscle weakness (generalized): Secondary | ICD-10-CM

## 2023-10-30 DIAGNOSIS — G8929 Other chronic pain: Secondary | ICD-10-CM | POA: Diagnosis not present

## 2023-10-30 DIAGNOSIS — R262 Difficulty in walking, not elsewhere classified: Secondary | ICD-10-CM

## 2023-10-30 NOTE — Therapy (Signed)
OUTPATIENT PHYSICAL THERAPY TREATMENT   Patient Name: Alexis Fox MRN: [id number] DOB:[birth year], 67 y.o., female Today's Date: 10/30/2023  END OF SESSION:  PT End of Session - 10/30/23 1015     Visit Number 9    Number of Visits 24    Date for PT Re-Evaluation 12/17/23    Authorization Type [employer] $20 copay    Authorization Time Period $20 COPAYApproved  Authorization #[id number]  Tracking #[id number]  09/24/2023-12/17/2023    Authorization - Visit Number 9    Progress Note Due on Visit 10    PT Start Time 1015    PT Stop Time 1104    PT Time Calculation (min) 49 min    Activity Tolerance Patient limited by pain    Behavior During Therapy [facility] for tasks assessed/performed                     Past Medical History:  Diagnosis Date   Anxiety    Arthritis    knees   Breast cancer (HCC)    Breast cancer of upper-outer quadrant of left female breast (HCC) 03/20/2016   C2 cervical fracture (HCC)    10/22/18, following fall from ladder   Cervicalgia    Cholecystitis    Complication of anesthesia    BP "crashes" post op   Early cataracts, bilateral    Family history of brain cancer    Family history of breast cancer    Hot flashes    Personal history of chemotherapy    Personal history of radiation therapy    Restless leg syndrome    Sleep apnea 10/03/2018   wears CPAP   Past Surgical History:  Procedure Laterality Date   ANTERIOR CERVICAL DECOMP/DISCECTOMY FUSION N/A 01/22/2020   Procedure: ACDF - C2-C3;  Surgeon: Tia Alert, MD;  Location: [facility] OR;  Service: Neurosurgery;  Laterality: N/A;   BACK SURGERY     x2   BREAST BIOPSY     BREAST LUMPECTOMY Left 2018   BUNIONECTOMY     CHOLECYSTECTOMY N/A 10/04/2015   Procedure: LAPAROSCOPIC CHOLECYSTECTOMY WITH INTRAOPERATIVE CHOLANGIOGRAM;  Surgeon: Gaynelle Adu, MD;  Location: [facility] OR;  Service: General;  Laterality: N/A;   ELBOW SURGERY     right for epicondylitis 2010    ESOPHAGEAL MANOMETRY  N/A 11/30/2020   Procedure: ESOPHAGEAL MANOMETRY (EM);  Surgeon: Kerin Salen, MD;  Location: [facility];  Service: Gastroenterology;  Laterality: N/A;   FOOT SURGERY     1983 -tarsal tunnel release   IR GENERIC HISTORICAL  10/26/2016   IR CV LINE INJECTION 10/26/2016 [facility]-INTER[facility] RAD   LUMBAR DISC SURGERY  04/04/2012   MASS EXCISION Left 02/06/2021   Procedure: EXCISION LEFT FOOT SOFT TISSUE BETWEEN SECOND AND THIRD AND THIRD AND FOURTH TOES;  Surgeon: Asencion Islam, DPM;  Location: [facility];  Service: Podiatry;  Laterality: Left;   PORT-A-CATH REMOVAL N/A 11/15/2016   Procedure: REMOVAL PORT-A-CATH;  Surgeon: Emelia Loron, MD;  Location: [facility];  Service: General;  Laterality: N/A;   PORTACATH PLACEMENT Right 04/02/2016   Procedure: INSERTION PORT-A-CATH WITH [place];  Surgeon: Emelia Loron, MD;  Location: [facility];  Service: General;  Laterality: Right;   RADIOACTIVE SEED GUIDED PARTIAL MASTECTOMY/AXILLARY SENTINEL NODE BIOPSY/AXILLARY NODE DISSECTION Left 09/12/2016   Procedure: LEFT BREAST SEED GUIDED LUMPECTOMY, LEFT AXILLARY SENTINEL NODE BIOPSY, LEFT SEED GUIDED AXILLARY NODE EXCISION( TARGETED AXILLARY DISSECTION), BLUE DYE INJECTION;  Surgeon: Emelia Loron, MD;  Location: [facility];  Service: General;  Laterality: Left;   SACROILIAC JOINT FUSION Left 05/18/2020   Procedure: LEFT SACROILIAC JOINT FUSION;  Surgeon: Estill Bamberg, MD;  Location: [facility] OR;  Service: Orthopedics;  Laterality: Left;   SPINAL FUSION  5/12   L5-S1   SPINAL FUSION  05/18/2019   L4-5   TARSAL TUNNEL RELEASE     TOTAL KNEE ARTHROPLASTY Right 09/03/2023   Procedure: RIGHT TOTAL KNEE ARTHROPLASTY;  Surgeon: Cammy Copa, MD;  Location: [facility] OR;  Service: Orthopedics;  Laterality: Right;   Patient Active Problem List   Diagnosis Date Noted   Arthritis of knee, right 10/01/2023   DJD (degenerative joint disease) of knee 09/03/2023    S/P total knee arthroplasty, right 09/03/2023   Unilateral primary osteoarthritis, right knee 12/28/2022   Pain in left ankle and joints of left foot 09/13/2022   Hypersomnia, persistent 08/21/2022   Excessive daytime sleepiness 08/21/2022   MCI (mild cognitive impairment) 08/21/2022   History of breast cancer 08/21/2022   OSA on CPAP 08/21/2022   Pain in left shoulder 03/01/2021   Allergic rhinitis 02/21/2021   Anxiety 02/21/2021   Contact dermatitis due to plant 02/21/2021   Dysphagia 02/21/2021   Estrogen receptor negative status (ER-) 02/21/2021   Gastroesophageal reflux disease 02/21/2021   Gastroparesis 02/21/2021   Insomnia 02/21/2021   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   Morton's neuroma of left foot 02/21/2021   Personal history of malignant neoplasm of breast 02/21/2021   Vitamin D deficiency 02/21/2021   Oth nondisplaced dens fracture, init for clos fx (HCC) 02/04/2020   S/P cervical spinal fusion 01/22/2020   Memory deficit 01/14/2020   Sacroiliac dysfunction 01/14/2020   Neck pain 11/05/2019   Body mass index (BMI) 25.0-25.9, adult 11/05/2019   Unilateral primary osteoarthritis, right hip 09/01/2019   Trochanteric bursitis, left hip 07/14/2019   S/P lumbar fusion 05/18/2019   OSA (obstructive sleep apnea) 10/28/2018   Pain managed using patient-controlled analgesia (PCA) 10/25/2018   Fall 10/24/2018   Fall from ladder 10/23/2018   DDD (degenerative disc disease), lumbosacral 09/15/2018   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   Central line complication 10/22/2016   Genetic testing 05/15/2016   Family history of breast cancer    Family history of brain cancer    Breast cancer of upper-outer quadrant of left female breast (HCC) 03/20/2016   Transaminitis 10/03/2015   Symptomatic cholelithiasis 10/03/2015   RUQ abdominal pain    Lumbosacral neuritis 11/10/2014   Lumbar radiculopathy 10/19/2014   Depression 06/24/2013   RLS  (restless legs syndrome) 06/24/2013   Low back pain 05/19/2013    PCP: Thana Ates MD  REFERRING PROVIDER: Julieanne Cotton, PA-C   REFERRING DIAG: [phone] (ICD-10-CM) - Chronic pain of right knee   THERAPY DIAG:  Chronic pain of right knee  Muscle weakness (generalized)  Stiffness of right knee, not elsewhere classified  Difficulty in walking, not elsewhere classified  Localized edema  Rationale for Evaluation and Treatment: Rehabilitation  ONSET DATE: 09/03/2023 Rt TKA  SUBJECTIVE:   SUBJECTIVE STATEMENT: Pt indicated feeling like today was a "bad back" day.  Reported no pain at rest for knee.   Pt indicated doing "too much" on weekend with standing/walking on feet more.  Pt indicated getting stuff out of attic and working outside this weekend.  Pt indicated going to gym one day since last visit.    PERTINENT HISTORY: breast CA, ACDF, lumbar fusion, history of C2 fracture,   PAIN:  NPRS scale: 0/10 Pain location: Rt knee  Pain description: stiffness, tight, achy Aggravating factors: prolonged positioning, transfers (car transfers), increased WB activity, getting comfortable to sleep.  Relieving factors: pain medicine, ice   PRECAUTIONS: None  WEIGHT BEARING RESTRICTIONS: No  FALLS:  Has patient fallen in last 6 months? 1 fall day of surgery in bathroom at hospital  No fear of falling reported.   LIVING ENVIRONMENT: Lives in: House/apartment Stairs: 1 step to entry with bilateral rails.  Has following equipment at home: Cane, walker  OCCUPATION: No work  PLOF: Independent, likes to camp in RV trailer, has dogs.  Has silver sneakers  PATIENT GOALS: Reduce pain, get back to walking  OBJECTIVE:   PATIENT SURVEYS:  10/21/2023:  FOTO update:  55  09/24/2023 FOTO intake: 40   predicted:  53  COGNITION: 09/24/2023 Overall cognitive status: WFL    SENSATION: 09/24/2023 None tested today  EDEMA:  09/24/2023 Localized edema noted visually  Rt knee/lower leg  MUSCLE LENGTH: 09/24/2023 None tested today  POSTURE:  09/24/2023 Weight deviation to Lt in standing.   PALPATION: 10/01/2023:  tenderness to palpation quad tendon and vastus medialis just proximal to tendon.  Pain lateral knee with flexion motions.  Pt able to engage quads with some pain but no divot or indention in quad muscle felt.   09/24/2023 General tenderness to touch.   LOWER EXTREMITY ROM:   ROM Right 09/24/2023 Right 10/03/2023 Right 10/21/2023  Hip flexion     Hip extension     Hip abduction     Hip adduction     Hip internal rotation     Hip external rotation     Knee flexion 111 AROM in supine heel slide Seated heel slide A: 94* P: 99* Supine AA: 104* Strap/ball  Supine heel slide 112 AROM  Knee extension 0 AROM in sitting LAQ Seated heel slide A: 0* 0 AROM  Ankle dorsiflexion     Ankle plantarflexion     Ankle inversion     Ankle eversion      (Blank rows = not tested)  LOWER EXTREMITY MMT:  MMT Right 09/24/2023 Left 09/24/2023 Right 10/14/2023 Left 10/14/2023  Hip flexion 5/5 5/5    Hip extension      Hip abduction      Hip adduction      Hip internal rotation      Hip external rotation      Knee flexion 4/5 5/5 4/5   Knee extension 4/5 5/5 5/5 35.2, 32 lbs 5/5 50, 47 lbs  Ankle dorsiflexion 5/5 5/5    Ankle plantarflexion      Ankle inversion      Ankle eversion       (Blank rows = not tested)  LOWER EXTREMITY SPECIAL TESTS:  09/24/2023 None tested today  FUNCTIONAL TESTS:  10/30/2023:  Rt SLS:  < 3 seconds but able to maintain control independent.   09/24/2023 18 inch chair transfer: able to perform on 1st attempt but with increased difficulty Lt SLS:  unable Rt SLS:  unable   GAIT: 09/24/2023 Mod independent c SPC in Lt UE.  Reduced stance on Rt leg with weight shift off Rt.  Reduced toe off progression.  TODAY'S TREATMENT                                                                          DATE: 10/30/2023: Therapeutic Exercise:  Nustep Lvl 6 5 mins, 5 mins and lvl 5 UE/LE Incline gastroc stretch 30 sec x 3 bilaterally Seated LAQ Rt leg with end range pauses 2 x 15 5 lbs    TherActivity ( to improve transfers, ambulation, stairs) Leg press double leg 75 lbs x 15, single leg Rt 2 x 15 31 lbs Lateral step down 4 inch step x 15 WB on Rt leg   Neuro Re-ed SLS with vector reach light touch fwd, lateral, back x 6 each , performed bilaterally with occasional HHA on bar  Vaso  right knee 34* medium compression with elevation for 10 min.  TODAY'S TREATMENT                                                                          DATE: 10/23/2023: Therapeutic Exercise:  Recumbent bike seat 4 ROM 8 mins lvl 1.0 Seated LAQ Rt leg with end range pauses 2 x 15 Supine heel slide Rt AROM 5 sec, quad set 5 sec hold x 15  TherActivity ( to improve transfers, ambulation, stairs) Leg press double leg 68 lbs x 15, single leg Rt  x 15 31 lbs Flight of stairs c reciprocal gait pattern without hand assist  x 2 up/down  Neuro Re-ed Fitter rocker/board light touch fwd/back x 20 with occasional to moderate HHA on bar SLS on black mat with contralateral corner touching x 6 each, performed bilaterally  Vaso  right knee 34* medium compression with elevation for 10 min.  TODAY'S TREATMENT                                                                          DATE: 10/21/2023: Therapeutic Exercise:  SciFit bike seat 9, LE only Lvl 4.0 10 mins  Knee extension machine double leg, single Rt leg lowering slowly 2 x 10 10 lbs  Seated knee flexion machine Rt leg only 2 x 10 10 lbs    TherActivity ( to improve transfers, ambulation, stairs) Lateral step up/down on real step in clinic Rt leg WB x 10 (cues for home) Flight of stairs c  reciprocal gait pattern and single rail assist x 1 up/down  Neuro Re-ed Tandem stance on foam 1 min x 1 bilateral with occasional HHA on bar Lateral stepping 3 cones in arch x 6 each, performed bilaterally  Vaso  right knee 34* medium compression with elevation for 10 min.  PATIENT EDUCATION:  09/24/2023 Education details: HEP, POC Person educated: Patient Education method: Explanation,  Demonstration, Verbal cues, and Handouts Education comprehension: verbalized understanding, returned demonstration, and verbal cues required  HOME EXERCISE PROGRAM: Access Code: [email or web address] URL: [email or web address] Date: 09/26/2023 Prepared by: Vladimir Faster  Exercises - Supine Heel Slide  - 3-5 x daily - 7 x weekly - 1-2 sets - 10 reps - 2 hold - Seated Quad Set  - 3-5 x daily - 7 x weekly - 1 sets - 10 reps - 5 hold - Seated Straight Leg Heel Taps  - 1-2 x daily - 7 x weekly - 3 sets - 10 reps - Seated Long Arc Quad  - 3-5 x daily - 7 x weekly - 1-2 sets - 10 reps - 2 hold - Sit to Stand  - 3 x daily - 7 x weekly - 1 sets - 10 reps - Seated Knee Flexion Extension AROM   - 3 x daily - 7 x weekly - 1-2 sets - 10 reps - 5 seconds hold - Supine Heel Slide with Strap  - 3 x daily - 7 x weekly - 1-2 sets - 10 reps - 5 seconds hold - Active Straight Leg Raise with Quad Set  - 3 x daily - 7 x weekly - 1-2 sets - 10 reps - 5 seconds hold  Patient Education - Leg Elevation for Swelling  ASSESSMENT:  CLINICAL IMPRESSION: Some adjustment to standing activity due to back symptoms indicated upon arrival.  Difficulty in transitioning into WB loading on Rt single leg.  Continued skilled PT services indicated at this time.   OBJECTIVE IMPAIRMENTS: Abnormal gait, decreased activity tolerance, decreased balance, decreased coordination, decreased endurance, decreased mobility, difficulty walking, decreased ROM, decreased strength, hypomobility, increased edema, increased fascial restrictions,  impaired perceived functional ability, impaired flexibility, improper body mechanics, and pain.   ACTIVITY LIMITATIONS: carrying, lifting, bending, sitting, standing, squatting, sleeping, stairs, transfers, bed mobility, and locomotion level  PARTICIPATION LIMITATIONS: meal prep, cleaning, laundry, interpersonal relationship, driving, shopping, community activity, and yard work  PERSONAL FACTORS:  breast CA, ACDF, lumbar fusion, history of C2 fracture,   are also affecting patient's functional outcome.   REHAB POTENTIAL: Good  CLINICAL DECISION MAKING: Stable/uncomplicated  EVALUATION COMPLEXITY: Low   GOALS: Goals reviewed with patient? Yes  SHORT TERM GOALS: (target date for Short term goals are 3 weeks 10/15/2023)   1.  Patient will demonstrate independent use of home exercise program to maintain progress from in clinic treatments.  Goal status: Met   LONG TERM GOALS: (target dates for all long term goals are 12 weeks  12/17/2023 )   1. Patient will demonstrate/report pain at worst less than or equal to 2/10 to facilitate minimal limitation in daily activity secondary to pain symptoms.  Goal status: Ongoing 10/01/2023   2. Patient will demonstrate independent use of home exercise program to facilitate ability to maintain/progress functional gains from skilled physical therapy services.  Goal status: Ongoing 10/01/2023   3. Patient will demonstrate FOTO outcome > or = 53 % to indicate reduced disability due to condition.  Goal status: Ongoing 10/01/2023   4.  Patient will demonstrate Rt LE MMT 5/5 throughout to faciltiate usual transfers, stairs, squatting at [facility] for daily life.   Goal status: Ongoing 10/01/2023   5.  Patient will demonstrate Rt knee AROM 0-120 deg to facilitate sitting, transfers, ambulation s deviation due to range.  Goal status: Ongoing 10/01/2023   6.  Patient will demonstrate ascending/descending stairs reciprocally s UE assist for community  integration.   Goal status: Ongoing 10/01/2023  7.  Patient will demonstrate independent ambulation community distances > 300 ft for community integration.  Goal Status: Ongoing 10/01/2023   PLAN:  PT FREQUENCY: 1-2x/week  PT DURATION: 12 weeks  PLANNED INTERVENTIONS: Can include 16109- PT Re-evaluation, 97110-Therapeutic exercises, 97530- Therapeutic activity, 97112- Neuromuscular re-education, 97535- Self Care, 97140- Manual therapy, [phone]- Gait training, [phone]- Orthotic Fit/training, [phone]- Canalith repositioning, [id number]- Aquatic Therapy, 97014- Electrical stimulation (unattended), [id number]- Electrical stimulation (manual), [id number]- Vasopneumatic device, [id number]- Ultrasound, [id number]- Traction (mechanical), [id number]- Ionotophoresis 4mg /ml Dexamethasone, Patient/Family education, Balance training, Stair training, Taping, Dry Needling, Joint mobilization, Joint manipulation, Spinal manipulation, Spinal mobilization, Scar mobilization, Vestibular training, Visual/preceptual remediation/compensation, DME instructions, Cryotherapy, and Moist heat.  All performed as medically necessary.  All included unless contraindicated  PLAN FOR NEXT SESSION: 10th visit progress note.    MD visit Nov 25, 2023.   Chyrel Masson, PT, DPT, OCS, ATC 10/30/23  10:54 AM

## 2023-11-04 ENCOUNTER — Encounter: Payer: Self-pay | Admitting: Neurology

## 2023-11-05 ENCOUNTER — Encounter: Payer: Self-pay | Admitting: Rehabilitative and Restorative Service Providers"

## 2023-11-05 ENCOUNTER — Ambulatory Visit: Payer: Medicare PPO | Admitting: Rehabilitative and Restorative Service Providers"

## 2023-11-05 DIAGNOSIS — G8929 Other chronic pain: Secondary | ICD-10-CM | POA: Diagnosis not present

## 2023-11-05 DIAGNOSIS — M6281 Muscle weakness (generalized): Secondary | ICD-10-CM | POA: Diagnosis not present

## 2023-11-05 DIAGNOSIS — R262 Difficulty in walking, not elsewhere classified: Secondary | ICD-10-CM

## 2023-11-05 DIAGNOSIS — R600 Localized edema: Secondary | ICD-10-CM | POA: Diagnosis not present

## 2023-11-05 DIAGNOSIS — M25661 Stiffness of right knee, not elsewhere classified: Secondary | ICD-10-CM | POA: Diagnosis not present

## 2023-11-05 DIAGNOSIS — M25561 Pain in right knee: Secondary | ICD-10-CM | POA: Diagnosis not present

## 2023-11-05 NOTE — Therapy (Signed)
OUTPATIENT PHYSICAL THERAPY TREATMENT / PROGRESS NOTE   Patient Name: Alexis Fox MRN: [id number] DOB:29-Nov-1956, 67 y.o., female Today's Date: 11/05/2023  Progress Note Reporting Period 09/24/2023 to 11/05/2023  See note below for Objective Data and Assessment of Progress/Goals.      END OF SESSION:  PT End of Session - 11/05/23 1106     Visit Number 10    Number of Visits 24    Date for PT Re-Evaluation 12/17/23    Authorization Type [employer] $20 copay    Authorization Time Period $20 COPAYApproved  Authorization #[id number]  Tracking #[id number]  09/24/2023-12/17/2023    Authorization - Visit Number 10    Authorization - Number of Visits 12    Progress Note Due on Visit 13   [employer] re auth   PT Start Time 1057    PT Stop Time 1146    PT Time Calculation (min) 49 min    Activity Tolerance Patient tolerated treatment well    Behavior During Therapy [facility] for tasks assessed/performed                      Past Medical History:  Diagnosis Date   Anxiety    Arthritis    knees   Breast cancer (HCC)    Breast cancer of upper-outer quadrant of left female breast (HCC) 03/20/2016   C2 cervical fracture (HCC)    10/22/18, following fall from ladder   Cervicalgia    Cholecystitis    Complication of anesthesia    BP "crashes" post op   Early cataracts, bilateral    Family history of brain cancer    Family history of breast cancer    Hot flashes    Personal history of chemotherapy    Personal history of radiation therapy    Restless leg syndrome    Sleep apnea 10/03/2018   wears CPAP   Past Surgical History:  Procedure Laterality Date   ANTERIOR CERVICAL DECOMP/DISCECTOMY FUSION N/A 01/22/2020   Procedure: ACDF - C2-C3;  Surgeon: Tia Alert, MD;  Location: [facility] OR;  Service: Neurosurgery;  Laterality: N/A;   BACK SURGERY     x2   BREAST BIOPSY     BREAST LUMPECTOMY Left 2018   BUNIONECTOMY     CHOLECYSTECTOMY N/A 10/04/2015   Procedure:  LAPAROSCOPIC CHOLECYSTECTOMY WITH INTRAOPERATIVE CHOLANGIOGRAM;  Surgeon: Gaynelle Adu, MD;  Location: [facility] OR;  Service: General;  Laterality: N/A;   ELBOW SURGERY     right for epicondylitis 2010    ESOPHAGEAL MANOMETRY N/A 11/30/2020   Procedure: ESOPHAGEAL MANOMETRY (EM);  Surgeon: Kerin Salen, MD;  Location: [facility];  Service: Gastroenterology;  Laterality: N/A;   FOOT SURGERY     1983 -tarsal tunnel release   IR GENERIC HISTORICAL  10/26/2016   IR CV LINE INJECTION 10/26/2016 [facility]-INTERV RAD   LUMBAR DISC SURGERY  04/04/2012   MASS EXCISION Left 02/06/2021   Procedure: EXCISION LEFT FOOT SOFT TISSUE BETWEEN SECOND AND THIRD AND THIRD AND FOURTH TOES;  Surgeon: Asencion Islam, DPM;  Location: [facility];  Service: Podiatry;  Laterality: Left;   PORT-A-CATH REMOVAL N/A 11/15/2016   Procedure: REMOVAL PORT-A-CATH;  Surgeon: Emelia Loron, MD;  Location: [facility];  Service: General;  Laterality: N/A;   PORTACATH PLACEMENT Right 04/02/2016   Procedure: INSERTION PORT-A-CATH WITH [place];  Surgeon: Emelia Loron, MD;  Location: [facility];  Service: General;  Laterality: Right;   RADIOACTIVE SEED GUIDED PARTIAL MASTECTOMY/AXILLARY SENTINEL  NODE BIOPSY/AXILLARY NODE DISSECTION Left 09/12/2016   Procedure: LEFT BREAST SEED GUIDED LUMPECTOMY, LEFT AXILLARY SENTINEL NODE BIOPSY, LEFT SEED GUIDED AXILLARY NODE EXCISION( TARGETED AXILLARY DISSECTION), BLUE DYE INJECTION;  Surgeon: Emelia Loron, MD;  Location: [facility];  Service: General;  Laterality: Left;   SACROILIAC JOINT FUSION Left 05/18/2020   Procedure: LEFT SACROILIAC JOINT FUSION;  Surgeon: Estill Bamberg, MD;  Location: [facility] OR;  Service: Orthopedics;  Laterality: Left;   SPINAL FUSION  5/12   L5-S1   SPINAL FUSION  05/18/2019   L4-5   TARSAL TUNNEL RELEASE     TOTAL KNEE ARTHROPLASTY Right 09/03/2023   Procedure: RIGHT TOTAL KNEE ARTHROPLASTY;  Surgeon: Cammy Copa, MD;  Location: [facility] OR;  Service: Orthopedics;  Laterality: Right;   Patient Active Problem List   Diagnosis Date Noted   Arthritis of knee, right 10/01/2023   DJD (degenerative joint disease) of knee 09/03/2023   S/P total knee arthroplasty, right 09/03/2023   Unilateral primary osteoarthritis, right knee 12/28/2022   Pain in left ankle and joints of left foot 09/13/2022   Hypersomnia, persistent 08/21/2022   Excessive daytime sleepiness 08/21/2022   MCI (mild cognitive impairment) 08/21/2022   History of breast cancer 08/21/2022   OSA on CPAP 08/21/2022   Pain in left shoulder 03/01/2021   Allergic rhinitis 02/21/2021   Anxiety 02/21/2021   Contact dermatitis due to plant 02/21/2021   Dysphagia 02/21/2021   Estrogen receptor negative status (ER-) 02/21/2021   Gastroesophageal reflux disease 02/21/2021   Gastroparesis 02/21/2021   Insomnia 02/21/2021   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   Morton's neuroma of left foot 02/21/2021   Personal history of malignant neoplasm of breast 02/21/2021   Vitamin D deficiency 02/21/2021   Oth nondisplaced dens fracture, init for clos fx (HCC) 02/04/2020   S/P cervical spinal fusion 01/22/2020   Memory deficit 01/14/2020   Sacroiliac dysfunction 01/14/2020   Neck pain 11/05/2019   Body mass index (BMI) 25.0-25.9, adult 11/05/2019   Unilateral primary osteoarthritis, right hip 09/01/2019   Trochanteric bursitis, left hip 07/14/2019   S/P lumbar fusion 05/18/2019   OSA (obstructive sleep apnea) 10/28/2018   Pain managed using patient-controlled analgesia (PCA) 10/25/2018   Fall 10/24/2018   Fall from ladder 10/23/2018   DDD (degenerative disc disease), lumbosacral 09/15/2018   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   Central line complication 10/22/2016   Genetic testing 05/15/2016   Family history of breast cancer    Family history of brain cancer    Breast cancer of upper-outer quadrant of left  female breast (HCC) 03/20/2016   Transaminitis 10/03/2015   Symptomatic cholelithiasis 10/03/2015   RUQ abdominal pain    Lumbosacral neuritis 11/10/2014   Lumbar radiculopathy 10/19/2014   Depression 06/24/2013   RLS (restless legs syndrome) 06/24/2013   Low back pain 05/19/2013    PCP: Thana Ates MD  REFERRING PROVIDER: Julieanne Cotton, PA-C   REFERRING DIAG: [phone] (ICD-10-CM) - Chronic pain of right knee   THERAPY DIAG:  Chronic pain of right knee  Muscle weakness (generalized)  Stiffness of right knee, not elsewhere classified  Difficulty in walking, not elsewhere classified  Localized edema  Rationale for Evaluation and Treatment: Rehabilitation  ONSET DATE: 09/03/2023 Rt TKA  SUBJECTIVE:   SUBJECTIVE STATEMENT: Pt indicated tightness after sitting 20-30 mins still present.  Rated 50 % overall improvement at this time.   PERTINENT HISTORY: breast CA, ACDF, lumbar fusion, history of C2 fracture,  PAIN:  NPRS scale: at worst in last week: 3/10 Pain location: Rt knee  Pain description: stiffness, tight, achy Aggravating factors: prolonged positioning, transfers (car transfers), increased WB activity, getting comfortable to sleep.  Relieving factors: pain medicine, ice   PRECAUTIONS: None  WEIGHT BEARING RESTRICTIONS: No  FALLS:  Has patient fallen in last 6 months? 1 fall day of surgery in bathroom at hospital  No fear of falling reported.   LIVING ENVIRONMENT: Lives in: House/apartment Stairs: 1 step to entry with bilateral rails.  Has following equipment at home: Cane, walker  OCCUPATION: No work  PLOF: Independent, likes to camp in RV trailer, has dogs.  Has silver sneakers  PATIENT GOALS: Reduce pain, get back to walking  OBJECTIVE:   PATIENT SURVEYS:  10/21/2023:  FOTO update:  55  09/24/2023 FOTO intake: 40   predicted:  53  COGNITION: 09/24/2023 Overall cognitive status: WFL    SENSATION: 09/24/2023 None tested  today  EDEMA:  09/24/2023 Localized edema noted visually Rt knee/lower leg  MUSCLE LENGTH: 09/24/2023 None tested today  POSTURE:  09/24/2023 Weight deviation to Lt in standing.   PALPATION: 10/01/2023:  tenderness to palpation quad tendon and vastus medialis just proximal to tendon.  Pain lateral knee with flexion motions.  Pt able to engage quads with some pain but no divot or indention in quad muscle felt.   09/24/2023 General tenderness to touch.   LOWER EXTREMITY ROM:   ROM Right 09/24/2023 Right 10/03/2023 Right 10/21/2023 Right 11/05/2023  Hip flexion      Hip extension      Hip abduction      Hip adduction      Hip internal rotation      Hip external rotation      Knee flexion 111 AROM in supine heel slide Seated heel slide A: 94* P: 99* Supine AA: 104* Strap/ball  Supine heel slide 112 AROM Supine heel slide AROM 115   Knee extension 0 AROM in sitting LAQ Seated heel slide A: 0* 0 AROM 0 AROM  Ankle dorsiflexion      Ankle plantarflexion      Ankle inversion      Ankle eversion       (Blank rows = not tested)  LOWER EXTREMITY MMT:  MMT Right 09/24/2023 Left 09/24/2023 Right 10/14/2023 Left 10/14/2023 Right 11/05/2023  Hip flexion 5/5 5/5     Hip extension       Hip abduction       Hip adduction       Hip internal rotation       Hip external rotation       Knee flexion 4/5 5/5 4/5    Knee extension 4/5 5/5 5/5 35.2, 32 lbs 5/5 50, 47 lbs 5/5 50.5. 48.5  Ankle dorsiflexion 5/5 5/5     Ankle plantarflexion       Ankle inversion       Ankle eversion        (Blank rows = not tested)  LOWER EXTREMITY SPECIAL TESTS:  09/24/2023 None tested today  FUNCTIONAL TESTS:  11/05/2023:   18 inch chair transfer: able to perform on 1st attempt but with increased difficulty  10/30/2023: Rt SLS:  < 3 seconds but able to maintain control independent.   09/24/2023 18 inch chair transfer: able to perform on 1st attempt but with increased  difficulty Lt SLS:  unable Rt SLS:  unable   GAIT: 09/24/2023 Mod independent c SPC in Lt UE.  Reduced stance on  Rt leg with weight shift off Rt.  Reduced toe off progression.                                                                                                                                                                         TODAY'S TREATMENT                                                                          DATE: 11/05/2023: Therapeutic Exercise:  Recumbent bike lvl 2 8 mins, seat 4 Incline gastroc stretch 30 sec x 5 bilaterally Seated LAQ Rt leg with end range pauses 2 x 15 5 lbs  Supine AROM heel slide 5 sec hold/quad set 5 sec hold x 10 each  Supine Rt leg LAQ in 90 deg hip flexion 2 x 15  TherActivity ( to improve transfers, ambulation, stairs) Leg press double leg 81 lbs x 15, single leg Rt 2 x 10 37 lbs Flight of stairs reciprocally with single rail assist up/down x 4  Vaso  right knee 34* medium compression with elevation for 10 min.  TODAY'S TREATMENT                                                                          DATE: 10/30/2023: Therapeutic Exercise:  Nustep Lvl 6 5 mins, 5 mins and lvl 5 UE/LE Incline gastroc stretch 30 sec x 3 bilaterally Seated LAQ Rt leg with end range pauses 2 x 15 5 lbs    TherActivity ( to improve transfers, ambulation, stairs) Leg press double leg 75 lbs x 15, single leg Rt 2 x 15 31 lbs Lateral step down 4 inch step x 15 WB on Rt leg   Neuro Re-ed SLS with vector reach light touch fwd, lateral, back x 6 each , performed bilaterally with occasional HHA on bar  Vaso  right knee 34* medium compression with elevation for 10 min.  TODAY'S TREATMENT  DATE: 10/23/2023: Therapeutic Exercise:  Recumbent bike seat 4 ROM 8 mins lvl 1.0 Seated LAQ Rt leg with end range pauses 2 x 15 Supine heel slide Rt AROM 5 sec, quad set 5 sec hold x  15  TherActivity ( to improve transfers, ambulation, stairs) Leg press double leg 68 lbs x 15, single leg Rt  x 15 31 lbs Flight of stairs c reciprocal gait pattern without hand assist  x 2 up/down  Neuro Re-ed Fitter rocker/board light touch fwd/back x 20 with occasional to moderate HHA on bar SLS on black mat with contralateral corner touching x 6 each, performed bilaterally  Vaso  right knee 34* medium compression with elevation for 10 min.  TODAY'S TREATMENT                                                                          DATE: 10/21/2023: Therapeutic Exercise:  SciFit bike seat 9, LE only Lvl 4.0 10 mins  Knee extension machine double leg, single Rt leg lowering slowly 2 x 10 10 lbs  Seated knee flexion machine Rt leg only 2 x 10 10 lbs    TherActivity ( to improve transfers, ambulation, stairs) Lateral step up/down on real step in clinic Rt leg WB x 10 (cues for home) Flight of stairs c reciprocal gait pattern and single rail assist x 1 up/down  Neuro Re-ed Tandem stance on foam 1 min x 1 bilateral with occasional HHA on bar Lateral stepping 3 cones in arch x 6 each, performed bilaterally  Vaso  right knee 34* medium compression with elevation for 10 min.  PATIENT EDUCATION:  09/24/2023 Education details: HEP, POC Person educated: Patient Education method: Programmer, multimedia, Demonstration, Verbal cues, and Handouts Education comprehension: verbalized understanding, returned demonstration, and verbal cues required  HOME EXERCISE PROGRAM: Access Code: [email or web address] URL: [email or web address] Date: 09/26/2023 Prepared by: Vladimir Faster  Exercises - Supine Heel Slide  - 3-5 x daily - 7 x weekly - 1-2 sets - 10 reps - 2 hold - Seated Quad Set  - 3-5 x daily - 7 x weekly - 1 sets - 10 reps - 5 hold - Seated Straight Leg Heel Taps  - 1-2 x daily - 7 x weekly - 3 sets - 10 reps - Seated Long Arc Quad  - 3-5 x daily - 7 x weekly - 1-2 sets - 10 reps - 2 hold -  Sit to Stand  - 3 x daily - 7 x weekly - 1 sets - 10 reps - Seated Knee Flexion Extension AROM   - 3 x daily - 7 x weekly - 1-2 sets - 10 reps - 5 seconds hold - Supine Heel Slide with Strap  - 3 x daily - 7 x weekly - 1-2 sets - 10 reps - 5 seconds hold - Active Straight Leg Raise with Quad Set  - 3 x daily - 7 x weekly - 1-2 sets - 10 reps - 5 seconds hold  Patient Education - Leg Elevation for Swelling  ASSESSMENT:  CLINICAL IMPRESSION: The patient has attended 10 visits over the course of treatment cycle.  Patient has reported overall improvement at 50% with global rating of change at +5.  See objective data above  for updated information regarding current presentation.    OBJECTIVE IMPAIRMENTS: Abnormal gait, decreased activity tolerance, decreased balance, decreased coordination, decreased endurance, decreased mobility, difficulty walking, decreased ROM, decreased strength, hypomobility, increased edema, increased fascial restrictions, impaired perceived functional ability, impaired flexibility, improper body mechanics, and pain.   ACTIVITY LIMITATIONS: carrying, lifting, bending, sitting, standing, squatting, sleeping, stairs, transfers, bed mobility, and locomotion level  PARTICIPATION LIMITATIONS: meal prep, cleaning, laundry, interpersonal relationship, driving, shopping, community activity, and yard work  PERSONAL FACTORS:  breast CA, ACDF, lumbar fusion, history of C2 fracture,   are also affecting patient's functional outcome.   REHAB POTENTIAL: Good  CLINICAL DECISION MAKING: Stable/uncomplicated  EVALUATION COMPLEXITY: Low   GOALS: Goals reviewed with patient? Yes  SHORT TERM GOALS: (target date for Short term goals are 3 weeks 10/15/2023)   1.  Patient will demonstrate independent use of home exercise program to maintain progress from in clinic treatments.  Goal status: Met   LONG TERM GOALS: (target dates for all long term goals are 12 weeks  12/17/2023 )   1.  Patient will demonstrate/report pain at worst less than or equal to 2/10 to facilitate minimal limitation in daily activity secondary to pain symptoms.  Goal status: Ongoing 10/01/2023   2. Patient will demonstrate independent use of home exercise program to facilitate ability to maintain/progress functional gains from skilled physical therapy services.  Goal status: Ongoing 10/01/2023   3. Patient will demonstrate FOTO outcome > or = 53 % to indicate reduced disability due to condition.  Goal status: Ongoing 10/01/2023   4.  Patient will demonstrate Rt LE MMT 5/5 throughout to faciltiate usual transfers, stairs, squatting at [facility] for daily life.   Goal status: Ongoing 10/01/2023   5.  Patient will demonstrate Rt knee AROM 0-120 deg to facilitate sitting, transfers, ambulation s deviation due to range.  Goal status: Ongoing 10/01/2023   6.  Patient will demonstrate ascending/descending stairs reciprocally s UE assist for community integration.   Goal status: Ongoing 10/01/2023   7.  Patient will demonstrate independent ambulation community distances > 300 ft for community integration.  Goal Status: Ongoing 10/01/2023   PLAN:  PT FREQUENCY: 1-2x/week  PT DURATION: 12 weeks  PLANNED INTERVENTIONS: Can include 13086- PT Re-evaluation, 97110-Therapeutic exercises, 97530- Therapeutic activity, 97112- Neuromuscular re-education, 97535- Self Care, 97140- Manual therapy, [phone]- Gait training, [phone]- Orthotic Fit/training, [phone]- Canalith repositioning, [id number]- Aquatic Therapy, 97014- Electrical stimulation (unattended), [id number]- Electrical stimulation (manual), [id number]- Vasopneumatic device, [id number]- Ultrasound, [id number]- Traction (mechanical), [id number]- Ionotophoresis 4mg /ml Dexamethasone, Patient/Family education, Balance training, Stair training, Taping, Dry Needling, Joint mobilization, Joint manipulation, Spinal manipulation, Spinal mobilization, Scar mobilization, Vestibular training,  Visual/preceptual remediation/compensation, DME instructions, Cryotherapy, and Moist heat.  All performed as medically necessary.  All included unless contraindicated  PLAN FOR NEXT SESSION:   Balance, progressive strengthening.  Gear towards HEP prep by christmas.     [employer] Recert on 13th visit if necessary   MD visit Nov 25, 2023.   Chyrel Masson, PT, DPT, OCS, ATC 11/05/23  11:40 AM

## 2023-11-07 ENCOUNTER — Ambulatory Visit: Payer: Medicare PPO | Admitting: Rehabilitative and Restorative Service Providers"

## 2023-11-07 DIAGNOSIS — G8929 Other chronic pain: Secondary | ICD-10-CM

## 2023-11-07 DIAGNOSIS — M25561 Pain in right knee: Secondary | ICD-10-CM

## 2023-11-07 DIAGNOSIS — R262 Difficulty in walking, not elsewhere classified: Secondary | ICD-10-CM

## 2023-11-07 DIAGNOSIS — M6281 Muscle weakness (generalized): Secondary | ICD-10-CM | POA: Diagnosis not present

## 2023-11-07 DIAGNOSIS — M25661 Stiffness of right knee, not elsewhere classified: Secondary | ICD-10-CM | POA: Diagnosis not present

## 2023-11-07 DIAGNOSIS — R600 Localized edema: Secondary | ICD-10-CM

## 2023-11-07 NOTE — Therapy (Signed)
OUTPATIENT PHYSICAL THERAPY TREATMENT   Patient Name: Alexis Fox MRN: [id number] DOB:[birth year], 67 y.o., female Today's Date: 11/07/2023   END OF SESSION:  PT End of Session - 11/07/23 1138     Visit Number 11    Number of Visits 24    Date for PT Re-Evaluation 12/17/23    Authorization Type [employer] $20 copay    Authorization Time Period $20 COPAYApproved  Authorization #[id number]  Tracking #[id number]  09/24/2023-12/17/2023    Authorization - Number of Visits 12    Progress Note Due on Visit 13   [employer] re auth   PT Start Time 1137    PT Stop Time 1216    PT Time Calculation (min) 39 min    Activity Tolerance Patient tolerated treatment well    Behavior During Therapy [facility] for tasks assessed/performed                       Past Medical History:  Diagnosis Date   Anxiety    Arthritis    knees   Breast cancer (HCC)    Breast cancer of upper-outer quadrant of left female breast (HCC) 03/20/2016   C2 cervical fracture (HCC)    10/22/18, following fall from ladder   Cervicalgia    Cholecystitis    Complication of anesthesia    BP "crashes" post op   Early cataracts, bilateral    Family history of brain cancer    Family history of breast cancer    Hot flashes    Personal history of chemotherapy    Personal history of radiation therapy    Restless leg syndrome    Sleep apnea 10/03/2018   wears CPAP   Past Surgical History:  Procedure Laterality Date   ANTERIOR CERVICAL DECOMP/DISCECTOMY FUSION N/A 01/22/2020   Procedure: ACDF - C2-C3;  Surgeon: Tia Alert, MD;  Location: [facility] OR;  Service: Neurosurgery;  Laterality: N/A;   BACK SURGERY     x2   BREAST BIOPSY     BREAST LUMPECTOMY Left 2018   BUNIONECTOMY     CHOLECYSTECTOMY N/A 10/04/2015   Procedure: LAPAROSCOPIC CHOLECYSTECTOMY WITH INTRAOPERATIVE CHOLANGIOGRAM;  Surgeon: Gaynelle Adu, MD;  Location: [facility] OR;  Service: General;  Laterality: N/A;   ELBOW SURGERY     right for  epicondylitis 2010    ESOPHAGEAL MANOMETRY N/A 11/30/2020   Procedure: ESOPHAGEAL MANOMETRY (EM);  Surgeon: Kerin Salen, MD;  Location: [facility];  Service: Gastroenterology;  Laterality: N/A;   FOOT SURGERY     1983 -tarsal tunnel release   IR GENERIC HISTORICAL  10/26/2016   IR CV LINE INJECTION 10/26/2016 [facility]-INTERV RAD   LUMBAR DISC SURGERY  04/04/2012   MASS EXCISION Left 02/06/2021   Procedure: EXCISION LEFT FOOT SOFT TISSUE BETWEEN SECOND AND THIRD AND THIRD AND FOURTH TOES;  Surgeon: Asencion Islam, DPM;  Location: [facility];  Service: Podiatry;  Laterality: Left;   PORT-A-CATH REMOVAL N/A 11/15/2016   Procedure: REMOVAL PORT-A-CATH;  Surgeon: Emelia Loron, MD;  Location: [facility];  Service: General;  Laterality: N/A;   PORTACATH PLACEMENT Right 04/02/2016   Procedure: INSERTION PORT-A-CATH WITH [place];  Surgeon: Emelia Loron, MD;  Location: [facility];  Service: General;  Laterality: Right;   RADIOACTIVE SEED GUIDED PARTIAL MASTECTOMY/AXILLARY SENTINEL NODE BIOPSY/AXILLARY NODE DISSECTION Left 09/12/2016   Procedure: LEFT BREAST SEED GUIDED LUMPECTOMY, LEFT AXILLARY SENTINEL NODE BIOPSY, LEFT SEED GUIDED AXILLARY NODE EXCISION( TARGETED AXILLARY DISSECTION), BLUE DYE INJECTION;  Surgeon: Emelia Loron, MD;  Location: [facility];  Service: General;  Laterality: Left;   SACROILIAC JOINT FUSION Left 05/18/2020   Procedure: LEFT SACROILIAC JOINT FUSION;  Surgeon: Estill Bamberg, MD;  Location: [facility] OR;  Service: Orthopedics;  Laterality: Left;   SPINAL FUSION  5/12   L5-S1   SPINAL FUSION  05/18/2019   L4-5   TARSAL TUNNEL RELEASE     TOTAL KNEE ARTHROPLASTY Right 09/03/2023   Procedure: RIGHT TOTAL KNEE ARTHROPLASTY;  Surgeon: Cammy Copa, MD;  Location: [facility] OR;  Service: Orthopedics;  Laterality: Right;   Patient Active Problem List   Diagnosis Date Noted   Arthritis of knee, right 10/01/2023   DJD  (degenerative joint disease) of knee 09/03/2023   S/P total knee arthroplasty, right 09/03/2023   Unilateral primary osteoarthritis, right knee 12/28/2022   Pain in left ankle and joints of left foot 09/13/2022   Hypersomnia, persistent 08/21/2022   Excessive daytime sleepiness 08/21/2022   MCI (mild cognitive impairment) 08/21/2022   History of breast cancer 08/21/2022   OSA on CPAP 08/21/2022   Pain in left shoulder 03/01/2021   Allergic rhinitis 02/21/2021   Anxiety 02/21/2021   Contact dermatitis due to plant 02/21/2021   Dysphagia 02/21/2021   Estrogen receptor negative status (ER-) 02/21/2021   Gastroesophageal reflux disease 02/21/2021   Gastroparesis 02/21/2021   Insomnia 02/21/2021   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   Morton's neuroma of left foot 02/21/2021   Personal history of malignant neoplasm of breast 02/21/2021   Vitamin D deficiency 02/21/2021   Oth nondisplaced dens fracture, init for clos fx (HCC) 02/04/2020   S/P cervical spinal fusion 01/22/2020   Memory deficit 01/14/2020   Sacroiliac dysfunction 01/14/2020   Neck pain 11/05/2019   Body mass index (BMI) 25.0-25.9, adult 11/05/2019   Unilateral primary osteoarthritis, right hip 09/01/2019   Trochanteric bursitis, left hip 07/14/2019   S/P lumbar fusion 05/18/2019   OSA (obstructive sleep apnea) 10/28/2018   Pain managed using patient-controlled analgesia (PCA) 10/25/2018   Fall 10/24/2018   Fall from ladder 10/23/2018   DDD (degenerative disc disease), lumbosacral 09/15/2018   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   Central line complication 10/22/2016   Genetic testing 05/15/2016   Family history of breast cancer    Family history of brain cancer    Breast cancer of upper-outer quadrant of left female breast (HCC) 03/20/2016   Transaminitis 10/03/2015   Symptomatic cholelithiasis 10/03/2015   RUQ abdominal pain    Lumbosacral neuritis 11/10/2014   Lumbar  radiculopathy 10/19/2014   Depression 06/24/2013   RLS (restless legs syndrome) 06/24/2013   Low back pain 05/19/2013    PCP: Thana Ates MD  REFERRING PROVIDER: Julieanne Cotton, PA-C   REFERRING DIAG: [id number] (ICD-10-CM) - Chronic pain of right knee   THERAPY DIAG:  Chronic pain of right knee  Muscle weakness (generalized)  Stiffness of right knee, not elsewhere classified  Difficulty in walking, not elsewhere classified  Localized edema  Rationale for Evaluation and Treatment: Rehabilitation  ONSET DATE: 09/03/2023 Rt TKA  SUBJECTIVE:   SUBJECTIVE STATEMENT: Indicated tightness as usual at start of movement.   PERTINENT HISTORY: breast CA, ACDF, lumbar fusion, history of C2 fracture,   PAIN:  NPRS scale: no specific pain upon arrival Pain location: Rt knee  Pain description: stiffness, tight, achy Aggravating factors: prolonged positioning, transfers (car transfers), increased WB activity, getting comfortable to sleep.  Relieving factors: pain medicine, ice   PRECAUTIONS:  None  WEIGHT BEARING RESTRICTIONS: No  FALLS:  Has patient fallen in last 6 months? 1 fall day of surgery in bathroom at hospital  No fear of falling reported.   LIVING ENVIRONMENT: Lives in: House/apartment Stairs: 1 step to entry with bilateral rails.  Has following equipment at home: Cane, walker  OCCUPATION: No work  PLOF: Independent, likes to camp in RV trailer, has dogs.  Has silver sneakers  PATIENT GOALS: Reduce pain, get back to walking  OBJECTIVE:   PATIENT SURVEYS:  10/21/2023:  FOTO update:  55  09/24/2023 FOTO intake: 40   predicted:  53  COGNITION: 09/24/2023 Overall cognitive status: WFL    SENSATION: 09/24/2023 None tested today  EDEMA:  09/24/2023 Localized edema noted visually Rt knee/lower leg  MUSCLE LENGTH: 09/24/2023 None tested today  POSTURE:  09/24/2023 Weight deviation to Lt in standing.   PALPATION: 10/01/2023:   tenderness to palpation quad tendon and vastus medialis just proximal to tendon.  Pain lateral knee with flexion motions.  Pt able to engage quads with some pain but no divot or indention in quad muscle felt.   09/24/2023 General tenderness to touch.   LOWER EXTREMITY ROM:   ROM Right 09/24/2023 Right 10/03/2023 Right 10/21/2023 Right 11/05/2023  Hip flexion      Hip extension      Hip abduction      Hip adduction      Hip internal rotation      Hip external rotation      Knee flexion 111 AROM in supine heel slide Seated heel slide A: 94* P: 99* Supine AA: 104* Strap/ball  Supine heel slide 112 AROM Supine heel slide AROM 115   Knee extension 0 AROM in sitting LAQ Seated heel slide A: 0* 0 AROM 0 AROM  Ankle dorsiflexion      Ankle plantarflexion      Ankle inversion      Ankle eversion       (Blank rows = not tested)  LOWER EXTREMITY MMT:  MMT Right 09/24/2023 Left 09/24/2023 Right 10/14/2023 Left 10/14/2023 Right 11/05/2023  Hip flexion 5/5 5/5     Hip extension       Hip abduction       Hip adduction       Hip internal rotation       Hip external rotation       Knee flexion 4/5 5/5 4/5    Knee extension 4/5 5/5 5/5 35.2, 32 lbs 5/5 50, 47 lbs 5/5 50.5. 48.5  Ankle dorsiflexion 5/5 5/5     Ankle plantarflexion       Ankle inversion       Ankle eversion        (Blank rows = not tested)  LOWER EXTREMITY SPECIAL TESTS:  09/24/2023 None tested today  FUNCTIONAL TESTS:  11/05/2023:   18 inch chair transfer: able to perform on 1st attempt but with increased difficulty  10/30/2023: Rt SLS:  < 3 seconds but able to maintain control independent.   09/24/2023 18 inch chair transfer: able to perform on 1st attempt but with increased difficulty Lt SLS:  unable Rt SLS:  unable   GAIT: 09/24/2023 Mod independent c SPC in Lt UE.  Reduced stance on Rt leg with weight shift off Rt.  Reduced toe off progression.  TODAY'S TREATMENT                                                                          DATE: 11/07/2023: Therapeutic Exercise:  Recumbent bike lvl 2 8 mins, seat 4 Incline gastroc stretch 30 sec x 5 bilaterally Knee extension machine double leg, single Rt leg lowering slowly 2 x 10 10 lbs  Knee flexion Rt leg only  x 10 10 lbs , x 10 15 lbs  Neuro Re-ed Tandem stance on foam 1 min x 2 bilateral with occasional HHA Tandem ambulation on foam fwd/back 6 ft x 6 each way in // bars Lateral stepping on foam over 6 inch hurdle in // bar with occasional HHA 6 ft x 3 each way    TODAY'S TREATMENT                                                                          DATE: 11/05/2023: Therapeutic Exercise:  Recumbent bike lvl 2 8 mins, seat 4 Incline gastroc stretch 30 sec x 5 bilaterally Seated LAQ Rt leg with end range pauses 2 x 15 5 lbs  Supine AROM heel slide 5 sec hold/quad set 5 sec hold x 10 each  Supine Rt leg LAQ in 90 deg hip flexion 2 x 15  TherActivity ( to improve transfers, ambulation, stairs) Leg press double leg 81 lbs x 15, single leg Rt 2 x 10 37 lbs Flight of stairs reciprocally with single rail assist up/down x 4  Vaso  right knee 34* medium compression with elevation for 10 min.  TODAY'S TREATMENT                                                                          DATE: 10/30/2023: Therapeutic Exercise:  Nustep Lvl 6 5 mins, 5 mins and lvl 5 UE/LE Incline gastroc stretch 30 sec x 3 bilaterally Seated LAQ Rt leg with end range pauses 2 x 15 5 lbs    TherActivity ( to improve transfers, ambulation, stairs) Leg press double leg 75 lbs x 15, single leg Rt 2 x 15 31 lbs Lateral step down 4 inch step x 15 WB on Rt leg   Neuro Re-ed SLS with vector reach light touch fwd, lateral, back x 6 each , performed bilaterally with occasional HHA on  bar  Vaso  right knee 34* medium compression with elevation for 10 min.  TODAY'S TREATMENT  DATE: 10/23/2023: Therapeutic Exercise:  Recumbent bike seat 4 ROM 8 mins lvl 1.0 Seated LAQ Rt leg with end range pauses 2 x 15 Supine heel slide Rt AROM 5 sec, quad set 5 sec hold x 15  TherActivity ( to improve transfers, ambulation, stairs) Leg press double leg 68 lbs x 15, single leg Rt  x 15 31 lbs Flight of stairs c reciprocal gait pattern without hand assist  x 2 up/down  Neuro Re-ed Fitter rocker/board light touch fwd/back x 20 with occasional to moderate HHA on bar SLS on black mat with contralateral corner touching x 6 each, performed bilaterally  Vaso  right knee 34* medium compression with elevation for 10 min.   PATIENT EDUCATION:  09/24/2023 Education details: HEP, POC Person educated: Patient Education method: Programmer, multimedia, Demonstration, Verbal cues, and Handouts Education comprehension: verbalized understanding, returned demonstration, and verbal cues required  HOME EXERCISE PROGRAM: Access Code: [email or web address] URL: [email or web address] Date: 09/26/2023 Prepared by: Vladimir Faster  Exercises - Supine Heel Slide  - 3-5 x daily - 7 x weekly - 1-2 sets - 10 reps - 2 hold - Seated Quad Set  - 3-5 x daily - 7 x weekly - 1 sets - 10 reps - 5 hold - Seated Straight Leg Heel Taps  - 1-2 x daily - 7 x weekly - 3 sets - 10 reps - Seated Long Arc Quad  - 3-5 x daily - 7 x weekly - 1-2 sets - 10 reps - 2 hold - Sit to Stand  - 3 x daily - 7 x weekly - 1 sets - 10 reps - Seated Knee Flexion Extension AROM   - 3 x daily - 7 x weekly - 1-2 sets - 10 reps - 5 seconds hold - Supine Heel Slide with Strap  - 3 x daily - 7 x weekly - 1-2 sets - 10 reps - 5 seconds hold - Active Straight Leg Raise with Quad Set  - 3 x daily - 7 x weekly - 1-2 sets - 10 reps - 5 seconds hold  Patient Education - Leg  Elevation for Swelling  ASSESSMENT:  CLINICAL IMPRESSION: Able to progress in compliant surface balance with improving SL acceptance control.  Continued plan for functional strengthening improvements indicated at this time.  Will have reduced frequency due to improvements.    OBJECTIVE IMPAIRMENTS: Abnormal gait, decreased activity tolerance, decreased balance, decreased coordination, decreased endurance, decreased mobility, difficulty walking, decreased ROM, decreased strength, hypomobility, increased edema, increased fascial restrictions, impaired perceived functional ability, impaired flexibility, improper body mechanics, and pain.   ACTIVITY LIMITATIONS: carrying, lifting, bending, sitting, standing, squatting, sleeping, stairs, transfers, bed mobility, and locomotion level  PARTICIPATION LIMITATIONS: meal prep, cleaning, laundry, interpersonal relationship, driving, shopping, community activity, and yard work  PERSONAL FACTORS:  breast CA, ACDF, lumbar fusion, history of C2 fracture,   are also affecting patient's functional outcome.   REHAB POTENTIAL: Good  CLINICAL DECISION MAKING: Stable/uncomplicated  EVALUATION COMPLEXITY: Low   GOALS: Goals reviewed with patient? Yes  SHORT TERM GOALS: (target date for Short term goals are 3 weeks 10/15/2023)   1.  Patient will demonstrate independent use of home exercise program to maintain progress from in clinic treatments.  Goal status: Met   LONG TERM GOALS: (target dates for all long term goals are 12 weeks  12/17/2023 )   1. Patient will demonstrate/report pain at worst less than or equal to 2/10 to facilitate minimal limitation in daily activity secondary to pain symptoms.  Goal  status: Ongoing 11/07/2023   2. Patient will demonstrate independent use of home exercise program to facilitate ability to maintain/progress functional gains from skilled physical therapy services.  Goal status: Ongoing 11/07/2023   3. Patient will  demonstrate FOTO outcome > or = 53 % to indicate reduced disability due to condition.  Goal status: Ongoing 11/07/2023   4.  Patient will demonstrate Rt LE MMT 5/5 throughout to faciltiate usual transfers, stairs, squatting at [facility] for daily life.   Goal status: Ongoing 11/07/2023   5.  Patient will demonstrate Rt knee AROM 0-120 deg to facilitate sitting, transfers, ambulation s deviation due to range.  Goal status: Ongoing 11/07/2023   6.  Patient will demonstrate ascending/descending stairs reciprocally s UE assist for community integration.   Goal status: Ongoing 11/07/2023   7.  Patient will demonstrate independent ambulation community distances > 300 ft for community integration.  Goal Status: Ongoing 11/07/2023   PLAN:  PT FREQUENCY: 1-2x/week  PT DURATION: 12 weeks  PLANNED INTERVENTIONS: Can include 95621- PT Re-evaluation, 97110-Therapeutic exercises, 97530- Therapeutic activity, 97112- Neuromuscular re-education, 97535- Self Care, 97140- Manual therapy, [id number]- Gait training, [phone]- Orthotic Fit/training, [phone]- Canalith repositioning, [id number]- Aquatic Therapy, 97014- Electrical stimulation (unattended), [id number]- Electrical stimulation (manual), [id number]- Vasopneumatic device, [id number]- Ultrasound, [id number]- Traction (mechanical), [id number]- Ionotophoresis 4mg /ml Dexamethasone, Patient/Family education, Balance training, Stair training, Taping, Dry Needling, Joint mobilization, Joint manipulation, Spinal manipulation, Spinal mobilization, Scar mobilization, Vestibular training, Visual/preceptual remediation/compensation, DME instructions, Cryotherapy, and Moist heat.  All performed as medically necessary.  All included unless contraindicated  PLAN FOR NEXT SESSION:   Compliant and dynamic balance improvements, strengthening.     [employer] Recert on 13th visit if necessary   MD visit Nov 25, 2023.   Chyrel Masson, PT, DPT, OCS, ATC 11/07/23  12:15 PM

## 2023-11-12 ENCOUNTER — Encounter: Payer: Self-pay | Admitting: Rehabilitative and Restorative Service Providers"

## 2023-11-12 ENCOUNTER — Ambulatory Visit: Payer: Medicare PPO | Admitting: Rehabilitative and Restorative Service Providers"

## 2023-11-12 DIAGNOSIS — R262 Difficulty in walking, not elsewhere classified: Secondary | ICD-10-CM

## 2023-11-12 DIAGNOSIS — G8929 Other chronic pain: Secondary | ICD-10-CM

## 2023-11-12 DIAGNOSIS — R600 Localized edema: Secondary | ICD-10-CM | POA: Diagnosis not present

## 2023-11-12 DIAGNOSIS — M25561 Pain in right knee: Secondary | ICD-10-CM | POA: Diagnosis not present

## 2023-11-12 DIAGNOSIS — M25661 Stiffness of right knee, not elsewhere classified: Secondary | ICD-10-CM

## 2023-11-12 DIAGNOSIS — M6281 Muscle weakness (generalized): Secondary | ICD-10-CM

## 2023-11-12 NOTE — Therapy (Signed)
OUTPATIENT PHYSICAL THERAPY TREATMENT   Patient Name: Princesa Hennessee MRN: [id number] DOB:23-Mar-1956, 67 y.o., female Today's Date: 11/12/2023   END OF SESSION:  PT End of Session - 11/12/23 1142     Visit Number 12    Number of Visits 24    Date for PT Re-Evaluation 12/17/23    Authorization Type [employer] $20 copay    Authorization Time Period $20 COPAYApproved  Authorization #[id number]  Tracking #[id number]  09/24/2023-12/17/2023    Authorization - Visit Number 12    Authorization - Number of Visits 12    Progress Note Due on Visit 13   [employer] re auth   PT Start Time 1138    PT Stop Time 1228    PT Time Calculation (min) 50 min    Activity Tolerance Patient tolerated treatment well    Behavior During Therapy [facility] for tasks assessed/performed                        Past Medical History:  Diagnosis Date   Anxiety    Arthritis    knees   Breast cancer (HCC)    Breast cancer of upper-outer quadrant of left female breast (HCC) 03/20/2016   C2 cervical fracture (HCC)    10/22/18, following fall from ladder   Cervicalgia    Cholecystitis    Complication of anesthesia    BP "crashes" post op   Early cataracts, bilateral    Family history of brain cancer    Family history of breast cancer    Hot flashes    Personal history of chemotherapy    Personal history of radiation therapy    Restless leg syndrome    Sleep apnea 10/03/2018   wears CPAP   Past Surgical History:  Procedure Laterality Date   ANTERIOR CERVICAL DECOMP/DISCECTOMY FUSION N/A 01/22/2020   Procedure: ACDF - C2-C3;  Surgeon: Tia Alert, MD;  Location: [facility] OR;  Service: Neurosurgery;  Laterality: N/A;   BACK SURGERY     x2   BREAST BIOPSY     BREAST LUMPECTOMY Left 2018   BUNIONECTOMY     CHOLECYSTECTOMY N/A 10/04/2015   Procedure: LAPAROSCOPIC CHOLECYSTECTOMY WITH INTRAOPERATIVE CHOLANGIOGRAM;  Surgeon: Gaynelle Adu, MD;  Location: [facility] OR;  Service: General;  Laterality: N/A;    ELBOW SURGERY     right for epicondylitis 2010    ESOPHAGEAL MANOMETRY N/A 11/30/2020   Procedure: ESOPHAGEAL MANOMETRY (EM);  Surgeon: Kerin Salen, MD;  Location: [facility];  Service: Gastroenterology;  Laterality: N/A;   FOOT SURGERY     1983 -tarsal tunnel release   IR GENERIC HISTORICAL  10/26/2016   IR CV LINE INJECTION 10/26/2016 [facility]INTER[facility] RAD   LUMBAR DISC SURGERY  04/04/2012   MASS EXCISION Left 02/06/2021   Procedure: EXCISION LEFT FOOT SOFT TISSUE BETWEEN SECOND AND THIRD AND THIRD AND FOURTH TOES;  Surgeon: Asencion Islam, DPM;  Location: [facility];  Service: Podiatry;  Laterality: Left;   PORT-A-CATH REMOVAL N/A 11/15/2016   Procedure: REMOVAL PORT-A-CATH;  Surgeon: Emelia Loron, MD;  Location: [facility];  Service: General;  Laterality: N/A;   PORTACATH PLACEMENT Right 04/02/2016   Procedure: INSERTION PORT-A-CATH WITH [place];  Surgeon: Emelia Loron, MD;  Location: [facility];  Service: General;  Laterality: Right;   RADIOACTIVE SEED GUIDED PARTIAL MASTECTOMY/AXILLARY SENTINEL NODE BIOPSY/AXILLARY NODE DISSECTION Left 09/12/2016   Procedure: LEFT BREAST SEED GUIDED LUMPECTOMY, LEFT AXILLARY SENTINEL NODE BIOPSY, LEFT SEED GUIDED AXILLARY  NODE EXCISION( TARGETED AXILLARY DISSECTION), BLUE DYE INJECTION;  Surgeon: Emelia Loron, MD;  Location: [facility];  Service: General;  Laterality: Left;   SACROILIAC JOINT FUSION Left 05/18/2020   Procedure: LEFT SACROILIAC JOINT FUSION;  Surgeon: Estill Bamberg, MD;  Location: [facility] OR;  Service: Orthopedics;  Laterality: Left;   SPINAL FUSION  5/12   L5-S1   SPINAL FUSION  05/18/2019   L4-5   TARSAL TUNNEL RELEASE     TOTAL KNEE ARTHROPLASTY Right 09/03/2023   Procedure: RIGHT TOTAL KNEE ARTHROPLASTY;  Surgeon: Cammy Copa, MD;  Location: [facility] OR;  Service: Orthopedics;  Laterality: Right;   Patient Active Problem List   Diagnosis Date Noted   Arthritis of knee,  right 10/01/2023   DJD (degenerative joint disease) of knee 09/03/2023   S/P total knee arthroplasty, right 09/03/2023   Unilateral primary osteoarthritis, right knee 12/28/2022   Pain in left ankle and joints of left foot 09/13/2022   Hypersomnia, persistent 08/21/2022   Excessive daytime sleepiness 08/21/2022   MCI (mild cognitive impairment) 08/21/2022   History of breast cancer 08/21/2022   OSA on CPAP 08/21/2022   Pain in left shoulder 03/01/2021   Allergic rhinitis 02/21/2021   Anxiety 02/21/2021   Contact dermatitis due to plant 02/21/2021   Dysphagia 02/21/2021   Estrogen receptor negative status (ER-) 02/21/2021   Gastroesophageal reflux disease 02/21/2021   Gastroparesis 02/21/2021   Insomnia 02/21/2021   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   Morton's neuroma of left foot 02/21/2021   Personal history of malignant neoplasm of breast 02/21/2021   Vitamin D deficiency 02/21/2021   Oth nondisplaced dens fracture, init for clos fx (HCC) 02/04/2020   S/P cervical spinal fusion 01/22/2020   Memory deficit 01/14/2020   Sacroiliac dysfunction 01/14/2020   Neck pain 11/05/2019   Body mass index (BMI) 25.0-25.9, adult 11/05/2019   Unilateral primary osteoarthritis, right hip 09/01/2019   Trochanteric bursitis, left hip 07/14/2019   S/P lumbar fusion 05/18/2019   OSA (obstructive sleep apnea) 10/28/2018   Pain managed using patient-controlled analgesia (PCA) 10/25/2018   Fall 10/24/2018   Fall from ladder 10/23/2018   DDD (degenerative disc disease), lumbosacral 09/15/2018   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   Central line complication 10/22/2016   Genetic testing 05/15/2016   Family history of breast cancer    Family history of brain cancer    Breast cancer of upper-outer quadrant of left female breast (HCC) 03/20/2016   Transaminitis 10/03/2015   Symptomatic cholelithiasis 10/03/2015   RUQ abdominal pain    Lumbosacral neuritis  11/10/2014   Lumbar radiculopathy 10/19/2014   Depression 06/24/2013   RLS (restless legs syndrome) 06/24/2013   Low back pain 05/19/2013    PCP: Thana Ates MD  REFERRING PROVIDER: Julieanne Cotton, PA-C   REFERRING DIAG: [phone] (ICD-10-CM) - Chronic pain of right knee   THERAPY DIAG:  Chronic pain of right knee  Muscle weakness (generalized)  Stiffness of right knee, not elsewhere classified  Difficulty in walking, not elsewhere classified  Localized edema  Rationale for Evaluation and Treatment: Rehabilitation  ONSET DATE: 09/03/2023 Rt TKA  SUBJECTIVE:   SUBJECTIVE STATEMENT: Pt indicated having to ice at house this morning due to some swelling and lateral pain.  Pt indicated no specific changes in activity yesterday.  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   PERTINENT HISTORY: breast CA, ACDF, lumbar fusion, history of C2 fracture,   PAIN:  NPRS scale: no specific pain upon arrival Pain location: Rt knee  Pain description: stiffness, tight, achy Aggravating factors: prolonged positioning, transfers (car transfers), increased WB activity, getting comfortable to sleep.  Relieving factors: pain medicine, ice   PRECAUTIONS: None  WEIGHT BEARING RESTRICTIONS: No  FALLS:  Has patient fallen in last 6 months? 1 fall day of surgery in bathroom at hospital  No fear of falling reported.   LIVING ENVIRONMENT: Lives in: House/apartment Stairs: 1 step to entry with bilateral rails.  Has following equipment at home: Cane, walker  OCCUPATION: No work  PLOF: Independent, likes to camp in RV trailer, has dogs.  Has silver sneakers  PATIENT GOALS: Reduce pain, get back to walking  OBJECTIVE:   PATIENT SURVEYS:  10/21/2023:  FOTO update:  55  09/24/2023 FOTO intake: 40   predicted:  53  COGNITION: 09/24/2023 Overall cognitive status: WFL    SENSATION: 09/24/2023 None tested today  EDEMA:  09/24/2023 Localized  edema noted visually Rt knee/lower leg  MUSCLE LENGTH: 09/24/2023 None tested today  POSTURE:  09/24/2023 Weight deviation to Lt in standing.   PALPATION: 10/01/2023:  tenderness to palpation quad tendon and vastus medialis just proximal to tendon.  Pain lateral knee with flexion motions.  Pt able to engage quads with some pain but no divot or indention in quad muscle felt.   09/24/2023 General tenderness to touch.   LOWER EXTREMITY ROM:   ROM Right 09/24/2023 Right 10/03/2023 Right 10/21/2023 Right 11/05/2023 Right 11/12/2023  Hip flexion       Hip extension       Hip abduction       Hip adduction       Hip internal rotation       Hip external rotation       Knee flexion 111 AROM in supine heel slide Seated heel slide A: 94* P: 99* Supine AA: 104* Strap/ball  Supine heel slide 112 AROM Supine heel slide AROM 115  Supine heel slide AROM 115  Knee extension 0 AROM in sitting LAQ Seated heel slide A: 0* 0 AROM 0 AROM   Ankle dorsiflexion       Ankle plantarflexion       Ankle inversion       Ankle eversion        (Blank rows = not tested)  LOWER EXTREMITY MMT:  MMT Right 09/24/2023 Left 09/24/2023 Right 10/14/2023 Left 10/14/2023 Right 11/05/2023  Hip flexion 5/5 5/5     Hip extension       Hip abduction       Hip adduction       Hip internal rotation       Hip external rotation       Knee flexion 4/5 5/5 4/5    Knee extension 4/5 5/5 5/5 35.2, 32 lbs 5/5 50, 47 lbs 5/5 50.5. 48.5  Ankle dorsiflexion 5/5 5/5     Ankle plantarflexion       Ankle inversion       Ankle eversion        (Blank rows = not tested)  LOWER EXTREMITY SPECIAL TESTS:  09/24/2023 None tested today  FUNCTIONAL TESTS:  11/05/2023:   18 inch chair transfer: able to perform on 1st attempt but with increased difficulty  10/30/2023: Rt SLS:  < 3 seconds but able to maintain control independent.   09/24/2023 18 inch chair transfer: able to perform on 1st attempt but with  increased difficulty Lt SLS:  unable Rt SLS:  unable  GAIT: 09/24/2023 Mod independent c SPC in Lt UE.  Reduced stance on Rt leg with weight shift off Rt.  Reduced toe off progression.                                                                                                                                                                         TODAY'S TREATMENT                                                                          DATE: 11/12/2023: Therapeutic Exercise:  Recumbent bike lvl 2 10 mins, seat 4 Incline gastroc stretch 30 sec x 3 bilaterally Leg press double leg  x 15 81 lbs    , Single leg 2 x 15 37 lbs  Seated Rt leg LAQ AROM x 15 Supine Lt leg 90 hip flexion LAQ x 15  Rt leg supine heel slide with quad set combo 5 sec hold x 10 each way  Neuro Re-ed Tandem stance on foam 1 min x 1 bilateral with occasional HHA Tandem ambulation on foam fwd/back 6 ft x 6 each way in // bars Fitter rocker board fwd/back light touching x 25  Vaso  right knee 34* medium compression with elevation for 10 min.  TODAY'S TREATMENT                                                                          DATE: 11/07/2023: Therapeutic Exercise:  Recumbent bike lvl 2 8 mins, seat 4 Incline gastroc stretch 30 sec x 5 bilaterally Knee extension machine double leg, single Rt leg lowering slowly 2 x 10 10 lbs  Knee flexion Rt leg only  x 10 10 lbs , x 10 15 lbs  Neuro Re-ed Tandem stance on foam 1 min x 2 bilateral with occasional HHA Tandem ambulation on foam fwd/back 6 ft x 6 each way in // bars Lateral stepping on foam over 6 inch hurdle in // bar with occasional HHA 6 ft x 3 each way    TODAY'S TREATMENT  DATE: 11/05/2023: Therapeutic Exercise:  Recumbent bike lvl 2 8 mins, seat 4 Incline gastroc stretch 30 sec x 5 bilaterally Seated LAQ Rt leg with end range pauses 2 x 15 5 lbs  Supine AROM heel slide 5  sec hold/quad set 5 sec hold x 10 each  Supine Rt leg LAQ in 90 deg hip flexion 2 x 15  TherActivity ( to improve transfers, ambulation, stairs) Leg press double leg 81 lbs x 15, single leg Rt 2 x 10 37 lbs Flight of stairs reciprocally with single rail assist up/down x 4  Vaso  right knee 34* medium compression with elevation for 10 min.   PATIENT EDUCATION:  09/24/2023 Education details: HEP, POC Person educated: Patient Education method: Programmer, multimedia, Demonstration, Verbal cues, and Handouts Education comprehension: verbalized understanding, returned demonstration, and verbal cues required  HOME EXERCISE PROGRAM: Access Code: [email or web address] URL: [email or web address] Date: 09/26/2023 Prepared by: Vladimir Faster  Exercises - Supine Heel Slide  - 3-5 x daily - 7 x weekly - 1-2 sets - 10 reps - 2 hold - Seated Quad Set  - 3-5 x daily - 7 x weekly - 1 sets - 10 reps - 5 hold - Seated Straight Leg Heel Taps  - 1-2 x daily - 7 x weekly - 3 sets - 10 reps - Seated Long Arc Quad  - 3-5 x daily - 7 x weekly - 1-2 sets - 10 reps - 2 hold - Sit to Stand  - 3 x daily - 7 x weekly - 1 sets - 10 reps - Seated Knee Flexion Extension AROM   - 3 x daily - 7 x weekly - 1-2 sets - 10 reps - 5 seconds hold - Supine Heel Slide with Strap  - 3 x daily - 7 x weekly - 1-2 sets - 10 reps - 5 seconds hold - Active Straight Leg Raise with Quad Set  - 3 x daily - 7 x weekly - 1-2 sets - 10 reps - 5 seconds hold  Patient Education - Leg Elevation for Swelling  ASSESSMENT:  CLINICAL IMPRESSION: Due to swelling complaints with return to work activity with standing WB increase, focused on review of at home ROM activity.  Overall mobility of Rt knee was still noted.    OBJECTIVE IMPAIRMENTS: Abnormal gait, decreased activity tolerance, decreased balance, decreased coordination, decreased endurance, decreased mobility, difficulty walking, decreased ROM, decreased strength, hypomobility, increased  edema, increased fascial restrictions, impaired perceived functional ability, impaired flexibility, improper body mechanics, and pain.   ACTIVITY LIMITATIONS: carrying, lifting, bending, sitting, standing, squatting, sleeping, stairs, transfers, bed mobility, and locomotion level  PARTICIPATION LIMITATIONS: meal prep, cleaning, laundry, interpersonal relationship, driving, shopping, community activity, and yard work  PERSONAL FACTORS:  breast CA, ACDF, lumbar fusion, history of C2 fracture,   are also affecting patient's functional outcome.   REHAB POTENTIAL: Good  CLINICAL DECISION MAKING: Stable/uncomplicated  EVALUATION COMPLEXITY: Low   GOALS: Goals reviewed with patient? Yes  SHORT TERM GOALS: (target date for Short term goals are 3 weeks 10/15/2023)   1.  Patient will demonstrate independent use of home exercise program to maintain progress from in clinic treatments.  Goal status: Met   LONG TERM GOALS: (target dates for all long term goals are 12 weeks  12/17/2023 )   1. Patient will demonstrate/report pain at worst less than or equal to 2/10 to facilitate minimal limitation in daily activity secondary to pain symptoms.  Goal status: Ongoing 11/07/2023   2. Patient will demonstrate independent  use of home exercise program to facilitate ability to maintain/progress functional gains from skilled physical therapy services.  Goal status: Ongoing 11/07/2023   3. Patient will demonstrate FOTO outcome > or = 53 % to indicate reduced disability due to condition.  Goal status: Ongoing 11/07/2023   4.  Patient will demonstrate Rt LE MMT 5/5 throughout to faciltiate usual transfers, stairs, squatting at [facility] for daily life.   Goal status: Ongoing 11/07/2023   5.  Patient will demonstrate Rt knee AROM 0-120 deg to facilitate sitting, transfers, ambulation s deviation due to range.  Goal status: Ongoing 11/07/2023   6.  Patient will demonstrate ascending/descending stairs  reciprocally s UE assist for community integration.   Goal status: Ongoing 11/07/2023   7.  Patient will demonstrate independent ambulation community distances > 300 ft for community integration.  Goal Status: Ongoing 11/07/2023   PLAN:  PT FREQUENCY: 1-2x/week  PT DURATION: 12 weeks  PLANNED INTERVENTIONS: Can include 04540- PT Re-evaluation, 97110-Therapeutic exercises, 97530- Therapeutic activity, 97112- Neuromuscular re-education, 97535- Self Care, 97140- Manual therapy, [phone]- Gait training, [phone]- Orthotic Fit/training, [phone]- Canalith repositioning, [id number]- Aquatic Therapy, 97014- Electrical stimulation (unattended), [id number]- Electrical stimulation (manual), [id number]- Vasopneumatic device, [id number]- Ultrasound, [id number]- Traction (mechanical), [id number]- Ionotophoresis 4mg /ml Dexamethasone, Patient/Family education, Balance training, Stair training, Taping, Dry Needling, Joint mobilization, Joint manipulation, Spinal manipulation, Spinal mobilization, Scar mobilization, Vestibular training, Visual/preceptual remediation/compensation, DME instructions, Cryotherapy, and Moist heat.  All performed as medically necessary.  All included unless contraindicated  PLAN FOR NEXT SESSION:   [employer] recert next visit.    MD visit Nov 25, 2023.   Chyrel Masson, PT, DPT, OCS, ATC 11/12/23  12:43 PM

## 2023-11-14 DIAGNOSIS — M8588 Other specified disorders of bone density and structure, other site: Secondary | ICD-10-CM | POA: Diagnosis not present

## 2023-11-14 DIAGNOSIS — E2839 Other primary ovarian failure: Secondary | ICD-10-CM | POA: Diagnosis not present

## 2023-11-14 DIAGNOSIS — M47816 Spondylosis without myelopathy or radiculopathy, lumbar region: Secondary | ICD-10-CM | POA: Diagnosis not present

## 2023-11-14 DIAGNOSIS — N958 Other specified menopausal and perimenopausal disorders: Secondary | ICD-10-CM | POA: Diagnosis not present

## 2023-11-21 ENCOUNTER — Encounter: Payer: Self-pay | Admitting: Rehabilitative and Restorative Service Providers"

## 2023-11-21 ENCOUNTER — Ambulatory Visit: Payer: Medicare PPO | Admitting: Rehabilitative and Restorative Service Providers"

## 2023-11-21 DIAGNOSIS — R262 Difficulty in walking, not elsewhere classified: Secondary | ICD-10-CM | POA: Diagnosis not present

## 2023-11-21 DIAGNOSIS — G8929 Other chronic pain: Secondary | ICD-10-CM

## 2023-11-21 DIAGNOSIS — R600 Localized edema: Secondary | ICD-10-CM

## 2023-11-21 DIAGNOSIS — M25661 Stiffness of right knee, not elsewhere classified: Secondary | ICD-10-CM | POA: Diagnosis not present

## 2023-11-21 DIAGNOSIS — M6281 Muscle weakness (generalized): Secondary | ICD-10-CM

## 2023-11-21 DIAGNOSIS — M25561 Pain in right knee: Secondary | ICD-10-CM

## 2023-11-21 NOTE — Therapy (Addendum)
OUTPATIENT PHYSICAL THERAPY TREATMENT / HUMANA RECERT  / DISCHARGE   Patient Name: Alexis Fox MRN: [id number] DOB:04/18/1956, 67 y.o., female Today's Date: 11/21/2023   END OF SESSION:  PT End of Session - 11/21/23 1104     Visit Number 13    Number of Visits 24    Date for PT Re-Evaluation 01/02/24    Authorization Type HUMANA Medicare $20 copay    Authorization Time Period $20 COPAYApproved  Authorization #[id number]  Tracking #[id number]  09/24/2023-12/17/2023    Authorization - Visit Number 1    Authorization - Number of Visits --    Progress Note Due on Visit 24   humana re auth   PT Start Time 1101    PT Stop Time 1151    PT Time Calculation (min) 50 min    Activity Tolerance Patient tolerated treatment well    Behavior During Therapy [facility] for tasks assessed/performed                         Past Medical History:  Diagnosis Date   Anxiety    Arthritis    knees   Breast cancer (HCC)    Breast cancer of upper-outer quadrant of left female breast (HCC) 03/20/2016   C2 cervical fracture (HCC)    10/22/18, following fall from ladder   Cervicalgia    Cholecystitis    Complication of anesthesia    BP "crashes" post op   Early cataracts, bilateral    Family history of brain cancer    Family history of breast cancer    Hot flashes    Personal history of chemotherapy    Personal history of radiation therapy    Restless leg syndrome    Sleep apnea 10/03/2018   wears CPAP   Past Surgical History:  Procedure Laterality Date   ANTERIOR CERVICAL DECOMP/DISCECTOMY FUSION N/A 01/22/2020   Procedure: ACDF - C2-C3;  Surgeon: Tia Alert, MD;  Location: [facility];  Service: Neurosurgery;  Laterality: N/A;   BACK SURGERY     x2   BREAST BIOPSY     BREAST LUMPECTOMY Left 2018   BUNIONECTOMY     CHOLECYSTECTOMY N/A 10/04/2015   Procedure: LAPAROSCOPIC CHOLECYSTECTOMY WITH INTRAOPERATIVE CHOLANGIOGRAM;  Surgeon: Gaynelle Adu, MD;  Location: [facility] OR;   Service: General;  Laterality: N/A;   ELBOW SURGERY     right for epicondylitis 2010    ESOPHAGEAL MANOMETRY N/A 11/30/2020   Procedure: ESOPHAGEAL MANOMETRY (EM);  Surgeon: Kerin Salen, MD;  Location: [facility];  Service: Gastroenterology;  Laterality: N/A;   FOOT SURGERY     1983 -tarsal tunnel release   IR GENERIC HISTORICAL  10/26/2016   IR CV LINE INJECTION 10/26/2016 [facility]-INTERV RAD   LUMBAR DISC SURGERY  04/04/2012   MASS EXCISION Left 02/06/2021   Procedure: EXCISION LEFT FOOT SOFT TISSUE BETWEEN SECOND AND THIRD AND THIRD AND FOURTH TOES;  Surgeon: Asencion Islam, DPM;  Location: [facility];  Service: Podiatry;  Laterality: Left;   PORT-A-CATH REMOVAL N/A 11/15/2016   Procedure: REMOVAL PORT-A-CATH;  Surgeon: Emelia Loron, MD;  Location: [facility];  Service: General;  Laterality: N/A;   PORTACATH PLACEMENT Right 04/02/2016   Procedure: INSERTION PORT-A-CATH WITH [place];  Surgeon: Emelia Loron, MD;  Location: [facility];  Service: General;  Laterality: Right;   RADIOACTIVE SEED GUIDED PARTIAL MASTECTOMY/AXILLARY SENTINEL NODE BIOPSY/AXILLARY NODE DISSECTION Left 09/12/2016   Procedure: LEFT BREAST SEED GUIDED LUMPECTOMY, LEFT AXILLARY  SENTINEL NODE BIOPSY, LEFT SEED GUIDED AXILLARY NODE EXCISION( TARGETED AXILLARY DISSECTION), BLUE DYE INJECTION;  Surgeon: Emelia Loron, MD;  Location: [facility];  Service: General;  Laterality: Left;   SACROILIAC JOINT FUSION Left 05/18/2020   Procedure: LEFT SACROILIAC JOINT FUSION;  Surgeon: Estill Bamberg, MD;  Location: [facility] OR;  Service: Orthopedics;  Laterality: Left;   SPINAL FUSION  5/12   L5-S1   SPINAL FUSION  05/18/2019   L4-5   TARSAL TUNNEL RELEASE     TOTAL KNEE ARTHROPLASTY Right 09/03/2023   Procedure: RIGHT TOTAL KNEE ARTHROPLASTY;  Surgeon: Cammy Copa, MD;  Location: [facility] OR;  Service: Orthopedics;  Laterality: Right;   Patient Active Problem List    Diagnosis Date Noted   Arthritis of knee, right 10/01/2023   DJD (degenerative joint disease) of knee 09/03/2023   S/P total knee arthroplasty, right 09/03/2023   Unilateral primary osteoarthritis, right knee 12/28/2022   Pain in left ankle and joints of left foot 09/13/2022   Hypersomnia, persistent 08/21/2022   Excessive daytime sleepiness 08/21/2022   MCI (mild cognitive impairment) 08/21/2022   History of breast cancer 08/21/2022   OSA on CPAP 08/21/2022   Pain in left shoulder 03/01/2021   Allergic rhinitis 02/21/2021   Anxiety 02/21/2021   Contact dermatitis due to plant 02/21/2021   Dysphagia 02/21/2021   Estrogen receptor negative status (ER-) 02/21/2021   Gastroesophageal reflux disease 02/21/2021   Gastroparesis 02/21/2021   Insomnia 02/21/2021   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   Morton's neuroma of left foot 02/21/2021   Personal history of malignant neoplasm of breast 02/21/2021   Vitamin D deficiency 02/21/2021   Oth nondisplaced dens fracture, init for clos fx (HCC) 02/04/2020   S/P cervical spinal fusion 01/22/2020   Memory deficit 01/14/2020   Sacroiliac dysfunction 01/14/2020   Neck pain 11/05/2019   Body mass index (BMI) 25.0-25.9, adult 11/05/2019   Unilateral primary osteoarthritis, right hip 09/01/2019   Trochanteric bursitis, left hip 07/14/2019   S/P lumbar fusion 05/18/2019   OSA (obstructive sleep apnea) 10/28/2018   Pain managed using patient-controlled analgesia (PCA) 10/25/2018   Fall 10/24/2018   Fall from ladder 10/23/2018   DDD (degenerative disc disease), lumbosacral 09/15/2018   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   Central line complication 10/22/2016   Genetic testing 05/15/2016   Family history of breast cancer    Family history of brain cancer    Breast cancer of upper-outer quadrant of left female breast (HCC) 03/20/2016   Transaminitis 10/03/2015   Symptomatic cholelithiasis 10/03/2015   RUQ  abdominal pain    Lumbosacral neuritis 11/10/2014   Lumbar radiculopathy 10/19/2014   Depression 06/24/2013   RLS (restless legs syndrome) 06/24/2013   Low back pain 05/19/2013    PCP: Thana Ates MD  REFERRING PROVIDER: Julieanne Cotton, PA-C   REFERRING DIAG: [phone] (ICD-10-CM) - Chronic pain of right knee   THERAPY DIAG:  Chronic pain of right knee  Muscle weakness (generalized)  Stiffness of right knee, not elsewhere classified  Difficulty in walking, not elsewhere classified  Localized edema  Rationale for Evaluation and Treatment: Rehabilitation  ONSET DATE: 09/03/2023 Rt TKA  SUBJECTIVE:   SUBJECTIVE STATEMENT: Pt indicated tight upon arrival today.  Pt indicated going to the gym and "pushed hard" and felt pain for a few days after visit.   Pt indicated having pain around knee.    PERTINENT HISTORY: breast CA, ACDF, lumbar fusion, history of C2 fracture,  PAIN:  NPRS scale: no specific pain upon arrival Pain location: Rt knee  Pain description: stiffness, tight, achy Aggravating factors: prolonged positioning, transfers (car transfers), increased WB activity, getting comfortable to sleep.  Relieving factors: pain medicine, ice   PRECAUTIONS: None  WEIGHT BEARING RESTRICTIONS: No  FALLS:  Has patient fallen in last 6 months? 1 fall day of surgery in bathroom at hospital  No fear of falling reported.   LIVING ENVIRONMENT: Lives in: House/apartment Stairs: 1 step to entry with bilateral rails.  Has following equipment at home: Cane, walker  OCCUPATION: No work  PLOF: Independent, likes to camp in RV trailer, has dogs.  Has silver sneakers  PATIENT GOALS: Reduce pain, get back to walking  OBJECTIVE:   PATIENT SURVEYS:  11/21/2023: FOTO : 64  10/21/2023:  FOTO update:  55  09/24/2023 FOTO intake: 40   predicted:  53  COGNITION: 09/24/2023 Overall cognitive status: WFL    SENSATION: 09/24/2023 None tested today  EDEMA:   09/24/2023 Localized edema noted visually Rt knee/lower leg  MUSCLE LENGTH: 09/24/2023 None tested today  POSTURE:  09/24/2023 Weight deviation to Lt in standing.   PALPATION: 10/01/2023:  tenderness to palpation quad tendon and vastus medialis just proximal to tendon.  Pain lateral knee with flexion motions.  Pt able to engage quads with some pain but no divot or indention in quad muscle felt.   09/24/2023 General tenderness to touch.   LOWER EXTREMITY ROM:   ROM Right 09/24/2023 Right 10/03/2023 Right 10/21/2023 Right 11/05/2023 Right 11/12/2023  Hip flexion       Hip extension       Hip abduction       Hip adduction       Hip internal rotation       Hip external rotation       Knee flexion 111 AROM in supine heel slide Seated heel slide A: 94* P: 99* Supine AA: 104* Strap/ball  Supine heel slide 112 AROM Supine heel slide AROM 115  Supine heel slide AROM 115  Knee extension 0 AROM in sitting LAQ Seated heel slide A: 0* 0 AROM 0 AROM   Ankle dorsiflexion       Ankle plantarflexion       Ankle inversion       Ankle eversion        (Blank rows = not tested)  LOWER EXTREMITY MMT:  MMT Right 09/24/2023 Left 09/24/2023 Right 10/14/2023 Left 10/14/2023 Right 11/05/2023  Hip flexion 5/5 5/5     Hip extension       Hip abduction       Hip adduction       Hip internal rotation       Hip external rotation       Knee flexion 4/5 5/5 4/5    Knee extension 4/5 5/5 5/5 35.2, 32 lbs 5/5 50, 47 lbs 5/5 50.5. 48.5  Ankle dorsiflexion 5/5 5/5     Ankle plantarflexion       Ankle inversion       Ankle eversion        (Blank rows = not tested)  LOWER EXTREMITY SPECIAL TESTS:  09/24/2023 None tested today  FUNCTIONAL TESTS:  11/21/2023: Lt SLS: 6 seconds  Rt SLS:  30 seconds   11/05/2023:   18 inch chair transfer: able to perform on 1st attempt but with increased difficulty  10/30/2023: Rt SLS:  < 3 seconds but able to maintain control independent.    09/24/2023 18 inch  chair transfer: able to perform on 1st attempt but with increased difficulty Lt SLS:  unable Rt SLS:  unable   GAIT: 09/24/2023 Mod independent c SPC in Lt UE.  Reduced stance on Rt leg with weight shift off Rt.  Reduced toe off progression.                                                                                                                                                                         TODAY'S TREATMENT                                                                          DATE: 11/21/2023: Therapeutic Exercise:  Recumbent bike lvl 2 10 mins, seat 4 Incline gastroc stretch 30 sec x 3 bilaterally Leg press double leg  x 15 81 lbs    , Single leg 2 x 15 37 lbs  Rt leg supine heel slide with quad set combo 5 sec hold x 10 each way Verbal review of plan for HEP trial   Neuro Re-ed SLS on Lt trial x 2 < 5 seconds each time.  SLS on Rt 30 second SLS with 9 inch hurdle clear over and back x 10 bilaterally  Lateral step over and back 9 inch step x 10   Vaso  right knee 34* medium compression with elevation for 10 min.  TODAY'S TREATMENT                                                                          DATE: 11/12/2023: Therapeutic Exercise:  Recumbent bike lvl 2 10 mins, seat 4 Incline gastroc stretch 30 sec x 3 bilaterally Leg press double leg  x 15 81 lbs    , Single leg 2 x 15 37 lbs  Seated Rt leg LAQ AROM x 15 Supine Lt leg 90 hip flexion LAQ x 15  Rt leg supine heel slide with quad set combo 5 sec hold x 10 each way  Neuro Re-ed Tandem stance on foam 1 min x 1 bilateral with occasional HHA Tandem ambulation on foam fwd/back 6 ft x 6 each way in // bars Fitter rocker board fwd/back light touching x  25  Vaso  right knee 34* medium compression with elevation for 10 min.  TODAY'S TREATMENT                                                                          DATE: 11/07/2023: Therapeutic Exercise:  Recumbent bike lvl 2 8  mins, seat 4 Incline gastroc stretch 30 sec x 5 bilaterally Knee extension machine double leg, single Rt leg lowering slowly 2 x 10 10 lbs  Knee flexion Rt leg only  x 10 10 lbs , x 10 15 lbs  Neuro Re-ed Tandem stance on foam 1 min x 2 bilateral with occasional HHA Tandem ambulation on foam fwd/back 6 ft x 6 each way in // bars Lateral stepping on foam over 6 inch hurdle in // bar with occasional HHA 6 ft x 3 each way   PATIENT EDUCATION:  09/24/2023 Education details: HEP, POC Person educated: Patient Education method: Programmer, multimedia, Demonstration, Verbal cues, and Handouts Education comprehension: verbalized understanding, returned demonstration, and verbal cues required  HOME EXERCISE PROGRAM: Access Code: [email or web address] URL: [email or web address] Date: 09/26/2023 Prepared by: Vladimir Faster  Exercises - Supine Heel Slide  - 3-5 x daily - 7 x weekly - 1-2 sets - 10 reps - 2 hold - Seated Quad Set  - 3-5 x daily - 7 x weekly - 1 sets - 10 reps - 5 hold - Seated Straight Leg Heel Taps  - 1-2 x daily - 7 x weekly - 3 sets - 10 reps - Seated Long Arc Quad  - 3-5 x daily - 7 x weekly - 1-2 sets - 10 reps - 2 hold - Sit to Stand  - 3 x daily - 7 x weekly - 1 sets - 10 reps - Seated Knee Flexion Extension AROM   - 3 x daily - 7 x weekly - 1-2 sets - 10 reps - 5 seconds hold - Supine Heel Slide with Strap  - 3 x daily - 7 x weekly - 1-2 sets - 10 reps - 5 seconds hold - Active Straight Leg Raise with Quad Set  - 3 x daily - 7 x weekly - 1-2 sets - 10 reps - 5 seconds hold  Patient Education - Leg Elevation for Swelling  ASSESSMENT:  CLINICAL IMPRESSION: Pt has attended 13 visits, requiring additional HUMANA recert from today going forward.  Pt has continued to report tightness/stiffness feeling despite overall ROM measurements looking well (pretty typical of surgery type).  See objective data for updated information. Pt may continue to benefit from progressive strengthening  for WB improvements in stairs/ambulation tolerance, as well as balance control improvements.  At this time, Pt to benefit from continued use of HEP and continued transitioning towards gym based activity to replace in clinic activity.    OBJECTIVE IMPAIRMENTS: Abnormal gait, decreased activity tolerance, decreased balance, decreased coordination, decreased endurance, decreased mobility, difficulty walking, decreased ROM, decreased strength, hypomobility, increased edema, increased fascial restrictions, impaired perceived functional ability, impaired flexibility, improper body mechanics, and pain.   ACTIVITY LIMITATIONS: carrying, lifting, bending, sitting, standing, squatting, sleeping, stairs, transfers, bed mobility, and locomotion level  PARTICIPATION LIMITATIONS: meal prep, cleaning, laundry, interpersonal relationship, driving, shopping, community activity, and yard work  PERSONAL FACTORS:  breast CA, ACDF, lumbar fusion, history of C2 fracture,   are also affecting patient's functional outcome.   REHAB POTENTIAL: Good  CLINICAL DECISION MAKING: Stable/uncomplicated  EVALUATION COMPLEXITY: Low   GOALS: Goals reviewed with patient? Yes  SHORT TERM GOALS: (target date for Short term goals are 3 weeks 10/15/2023)   1.  Patient will demonstrate independent use of home exercise program to maintain progress from in clinic treatments.  Goal status: Met   LONG TERM GOALS: (target dates for all long term goals are 12 weeks  12/17/2023 )   1. Patient will demonstrate/report pain at worst less than or equal to 2/10 to facilitate minimal limitation in daily activity secondary to pain symptoms.  Goal status: mostly met 11/21/2023   2. Patient will demonstrate independent use of home exercise program to facilitate ability to maintain/progress functional gains from skilled physical therapy services.  Goal status: Met 11/21/2023   3. Patient will demonstrate FOTO outcome > or = 53 % to  indicate reduced disability due to condition.  Goal status: Met 11/21/2023     4.  Patient will demonstrate Rt LE MMT 5/5 throughout to faciltiate usual transfers, stairs, squatting at [facility] for daily life.   Goal status: Met 11/21/2023     5.  Patient will demonstrate Rt knee AROM 0-120 deg to facilitate sitting, transfers, ambulation s deviation due to range.  Goal status: Met 11/21/2023     6.  Patient will demonstrate ascending/descending stairs reciprocally s UE assist for community integration.   Goal status: Met 11/21/2023     7.  Patient will demonstrate independent ambulation community distances > 300 ft for community integration.  Goal Status: Met 11/21/2023     PLAN:  PT FREQUENCY: 1-2x/week  PT DURATION: 12 weeks  PLANNED INTERVENTIONS: Can include 16109- PT Re-evaluation, 97110-Therapeutic exercises, 97530- Therapeutic activity, 97112- Neuromuscular re-education, 97535- Self Care, 97140- Manual therapy, [phone]- Gait training, [phone]- Orthotic Fit/training, [phone]- Canalith repositioning, [id number]- Aquatic Therapy, 97014- Electrical stimulation (unattended), [id number]- Electrical stimulation (manual), [id number]- Vasopneumatic device, [id number]- Ultrasound, [id number]- Traction (mechanical), [id number]- Ionotophoresis 4mg /ml Dexamethasone, Patient/Family education, Balance training, Stair training, Taping, Dry Needling, Joint mobilization, Joint manipulation, Spinal manipulation, Spinal mobilization, Scar mobilization, Vestibular training, Visual/preceptual remediation/compensation, DME instructions, Cryotherapy, and Moist heat.  All performed as medically necessary.  All included unless contraindicated  PLAN FOR NEXT SESSION:   Return to MD.  Trial HEP.    MD visit Nov 25, 2023.   Chyrel Masson, PT, DPT, OCS, ATC 11/21/23  11:45 AM   PHYSICAL THERAPY DISCHARGE SUMMARY  Visits from Start of Care: 13  Current functional level related to goals / functional outcomes: See note   Remaining  deficits: See note   Education / Equipment: HEP  Patient goals were met. Patient is being discharged due to not returning since the last visit.  Chyrel Masson, PT, DPT, OCS, ATC 12/24/23  10:18 AM     Referring diagnosis? M25.561,G89.29 (ICD-10-CM) - Chronic pain of right knee Treatment diagnosis? (if different than referring diagnosis) M25.561,G89.29 (ICD-10-CM) - Chronic pain of right knee What was this (referring dx) caused by? [x]  Surgery []  Fall []  Ongoing issue []  Arthritis []  Other: ____________   Laterality: [x]  Rt []  Lt []  Both   Check all possible CPT codes:                   *CHOOSE 10 OR LESS*                                []   16109 (Therapeutic Exercise)                   []  92507 (SLP Treatment)      []  [id number] (Neuro Re-ed)                                []  92526 (Swallowing Treatment)                  []  97116 (Gait Training)                                []  [id number] (Cognitive Training, 1st 15 minutes) []  97140 (Manual Therapy)                           []  97130 (Cognitive Training, each add'l 15 minutes)         []  97164 (Re-evaluation)                              []  Other, List CPT Code ____________  []  97530 (Therapeutic Activities)                                            []  97535 (Self Care)               [x]  All codes above (97110 - 97535)             []  97012 (Mechanical Traction)             []  97014 (E-stim Unattended)             []  97032 (E-stim manual)             []  97033 (Ionto)             []  97035 (Ultrasound) [x]  97750 (Physical Performance Training) []  [id number] (Aquatic Therapy) [x]  97016 (Vasopneumatic Device) []  [id number] (Paraffin) []  97034 (Contrast Bath) []  97597 (Wound Care 1st 20 sq cm) []  97598 (Wound Care each add'l 20 sq cm) []  97760 (Orthotic Fabrication, Fitting, Training Initial) []  60454 (Prosthetic Management and Training Initial) []  [id number] (Orthotic or Prosthetic Training/ Modification Subsequent)

## 2023-11-25 ENCOUNTER — Encounter: Payer: Self-pay | Admitting: Orthopedic Surgery

## 2023-11-25 ENCOUNTER — Ambulatory Visit: Payer: Medicare PPO | Admitting: Orthopedic Surgery

## 2023-11-25 DIAGNOSIS — Z96651 Presence of right artificial knee joint: Secondary | ICD-10-CM

## 2023-11-25 NOTE — Progress Notes (Signed)
Post-Op Visit Note   Patient: Alexis Fox           Date of Birth: [birth year]           MRN: [id number] Visit Date: 11/25/2023 PCP: Thana Ates, MD   Assessment & Plan:  Chief Complaint:  Chief Complaint  Patient presents with   Right Knee - Routine Post Op    RIGHT TKA (surgery date 09-03-23)   Visit Diagnoses:  1. S/P total knee arthroplasty, right     Plan: Eliya is a patient is now about 10 weeks out right total knee replacement.  Patient has worked very hard to rehabilitate her knee.  On examination she goes from 0-1 20.  Not too much swelling in the calf and fairly less than expected swelling around the knee.  No effusion in the knee.  Good stability to varus valgus stress at 0 30 and 90 degrees.  Plan at this time is to continue with quad strengthening exercises and functional activity.  She is happy with her result.  May be the left knee fixed at some point.  Follow-up with [place] as needed  Follow-Up Instructions: No follow-ups on file.   Orders:  No orders of the defined types were placed in this encounter.  No orders of the defined types were placed in this encounter.   Imaging: No results found.  PMFS History: Patient Active Problem List   Diagnosis Date Noted   Arthritis of knee, right 10/01/2023   DJD (degenerative joint disease) of knee 09/03/2023   S/P total knee arthroplasty, right 09/03/2023   Unilateral primary osteoarthritis, right knee 12/28/2022   Pain in left ankle and joints of left foot 09/13/2022   Hypersomnia, persistent 08/21/2022   Excessive daytime sleepiness 08/21/2022   MCI (mild cognitive impairment) 08/21/2022   History of breast cancer 08/21/2022   OSA on CPAP 08/21/2022   Pain in left shoulder 03/01/2021   Allergic rhinitis 02/21/2021   Anxiety 02/21/2021   Contact dermatitis due to plant 02/21/2021   Dysphagia 02/21/2021   Estrogen receptor negative status (ER-) 02/21/2021   Gastroesophageal reflux disease 02/21/2021    Gastroparesis 02/21/2021   Insomnia 02/21/2021   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   Morton's neuroma of left foot 02/21/2021   Personal history of malignant neoplasm of breast 02/21/2021   Vitamin D deficiency 02/21/2021   Oth nondisplaced dens fracture, init for clos fx (HCC) 02/04/2020   S/P cervical spinal fusion 01/22/2020   Memory deficit 01/14/2020   Sacroiliac dysfunction 01/14/2020   Neck pain 11/05/2019   Body mass index (BMI) 25.0-25.9, adult 11/05/2019   Unilateral primary osteoarthritis, right hip 09/01/2019   Trochanteric bursitis, left hip 07/14/2019   S/P lumbar fusion 05/18/2019   OSA (obstructive sleep apnea) 10/28/2018   Pain managed using patient-controlled analgesia (PCA) 10/25/2018   Fall 10/24/2018   Fall from ladder 10/23/2018   DDD (degenerative disc disease), lumbosacral 09/15/2018   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   Central line complication 10/22/2016   Genetic testing 05/15/2016   Family history of breast cancer    Family history of brain cancer    Breast cancer of upper-outer quadrant of left female breast (HCC) 03/20/2016   Transaminitis 10/03/2015   Symptomatic cholelithiasis 10/03/2015   RUQ abdominal pain    Lumbosacral neuritis 11/10/2014   Lumbar radiculopathy 10/19/2014   Depression 06/24/2013   RLS (restless legs syndrome) 06/24/2013   Low back pain 05/19/2013   Past Medical History:  Diagnosis Date   Anxiety    Arthritis    knees   Breast cancer (HCC)    Breast cancer of upper-outer quadrant of left female breast (HCC) 03/20/2016   C2 cervical fracture (HCC)    10/22/18, following fall from ladder   Cervicalgia    Cholecystitis    Complication of anesthesia    BP "crashes" post op   Early cataracts, bilateral    Family history of brain cancer    Family history of breast cancer    Hot flashes    Personal history of chemotherapy    Personal history of radiation therapy    Restless leg  syndrome    Sleep apnea 10/03/2018   wears CPAP    Family History  Problem Relation Age of Onset   Heart disease Mother    Pulmonary fibrosis Mother    Hypertension Mother    Sleep apnea Mother    Cancer Father 79       astocytoma   Hypertension Brother    Lymphoma Maternal Aunt    Anesthesia problems Neg Hx     Past Surgical History:  Procedure Laterality Date   ANTERIOR CERVICAL DECOMP/DISCECTOMY FUSION N/A 01/22/2020   Procedure: ACDF - C2-C3;  Surgeon: Tia Alert, MD;  Location: [facility] OR;  Service: Neurosurgery;  Laterality: N/A;   BACK SURGERY     x2   BREAST BIOPSY     BREAST LUMPECTOMY Left 2018   BUNIONECTOMY     CHOLECYSTECTOMY N/A 10/04/2015   Procedure: LAPAROSCOPIC CHOLECYSTECTOMY WITH INTRAOPERATIVE CHOLANGIOGRAM;  Surgeon: Gaynelle Adu, MD;  Location: [facility] OR;  Service: General;  Laterality: N/A;   ELBOW SURGERY     right for epicondylitis 2010    ESOPHAGEAL MANOMETRY N/A 11/30/2020   Procedure: ESOPHAGEAL MANOMETRY (EM);  Surgeon: Kerin Salen, MD;  Location: [facility];  Service: Gastroenterology;  Laterality: N/A;   FOOT SURGERY     1983 -tarsal tunnel release   IR GENERIC HISTORICAL  10/26/2016   IR CV LINE INJECTION 10/26/2016 [facility]   LUMBAR DISC SURGERY  04/04/2012   MASS EXCISION Left 02/06/2021   Procedure: EXCISION LEFT FOOT SOFT TISSUE BETWEEN SECOND AND THIRD AND THIRD AND FOURTH TOES;  Surgeon: Asencion Islam, DPM;  Location: [facility];  Service: Podiatry;  Laterality: Left;   PORT-A-CATH REMOVAL N/A 11/15/2016   Procedure: REMOVAL PORT-A-CATH;  Surgeon: Emelia Loron, MD;  Location: [facility];  Service: General;  Laterality: N/A;   PORTACATH PLACEMENT Right 04/02/2016   Procedure: INSERTION PORT-A-CATH WITH [place];  Surgeon: Emelia Loron, MD;  Location:  SURGERY CENTER;  Service: General;  Laterality: Right;   RADIOACTIVE SEED GUIDED PARTIAL MASTECTOMY/AXILLARY SENTINEL NODE BIOPSY/AXILLARY NODE  DISSECTION Left 09/12/2016   Procedure: LEFT BREAST SEED GUIDED LUMPECTOMY, LEFT AXILLARY SENTINEL NODE BIOPSY, LEFT SEED GUIDED AXILLARY NODE EXCISION( TARGETED AXILLARY DISSECTION), BLUE DYE INJECTION;  Surgeon: Emelia Loron, MD;  Location:  SURGERY CENTER;  Service: General;  Laterality: Left;   SACROILIAC JOINT FUSION Left 05/18/2020   Procedure: LEFT SACROILIAC JOINT FUSION;  Surgeon: Estill Bamberg, MD;  Location: [facility] OR;  Service: Orthopedics;  Laterality: Left;   SPINAL FUSION  5/12   L5-S1   SPINAL FUSION  05/18/2019   L4-5   TARSAL TUNNEL RELEASE     TOTAL KNEE ARTHROPLASTY Right 09/03/2023   Procedure: RIGHT TOTAL KNEE ARTHROPLASTY;  Surgeon: Cammy Copa, MD;  Location: [facility] OR;  Service: Orthopedics;  Laterality: Right;   Social History  Occupational History   Not on file  Tobacco Use   Smoking status: Never   Smokeless tobacco: Never  Vaping Use   Vaping status: Never Used  Substance and Sexual Activity   Alcohol use: Yes    Alcohol/week: 1.0 standard drink of alcohol    Types: 1 Cans of beer per week    Comment: Rarely 1/month   Drug use: No   Sexual activity: Not Currently    Birth control/protection: Post-menopausal

## 2023-12-09 ENCOUNTER — Ambulatory Visit: Payer: Medicare PPO | Admitting: Surgical

## 2023-12-09 ENCOUNTER — Encounter: Payer: Self-pay | Admitting: Surgical

## 2023-12-09 ENCOUNTER — Other Ambulatory Visit (INDEPENDENT_AMBULATORY_CARE_PROVIDER_SITE_OTHER): Payer: Medicare PPO

## 2023-12-09 DIAGNOSIS — Z96651 Presence of right artificial knee joint: Secondary | ICD-10-CM

## 2023-12-11 ENCOUNTER — Encounter: Payer: Self-pay | Admitting: Surgical

## 2023-12-11 NOTE — Progress Notes (Signed)
 Post-Op Visit Note   Patient: Alexis Fox           Date of Birth: [birth year]           MRN: [id number] Visit Date: 12/09/2023 PCP: Dwight Trula SQUIBB, MD   Assessment & Plan:  Chief Complaint:  Chief Complaint  Patient presents with   Right Knee - Follow-up    S/p right total knee arthroplasty 09/03/2023   Visit Diagnoses:  1. S/P total knee arthroplasty, right     Plan: Patient is a 68 year old female who presents s/p right total knee arthroplasty on 09/03/2023.  She states that her right knee is doing okay aside from lateral and anterior lateral sided pain with a constriction sensation and occasional popping sensation.  This pain is worsening and she feels that started after she had a popping incident about 2 to 3 weeks out from surgery.  She feels she has to hold pressure on the lateral aspect of her knee especially when riding in a car and this will help with some of her symptoms.  She denies any fevers or chills.  No change in the appearance of the incision.  She does have history of back pain and multiple back surgeries but no change in her typical low back pain.  She has no radicular pain or any new numbness or tingling.  No groin pain.  On exam, patient has incision that is well-healed.  No sinus tract noted.  No significant effusion is evident on exam.  No calf tenderness.  Negative Homans' sign.  No cellulitis or skin changes noted around the incision.  She does have good range of motion with approximately 0 degrees extension and 115 to 120 degrees knee flexion.  Excellent quad strength and hamstring strength without reproduction of pain.  She has very minimal tenderness over the pes anserine bursa.  She also has a popping sensation just distal to the lateral femoral condyle with passive extension/flexion.  Ultrasound examination today demonstrates very small trace effusion in the suprapatellar pouch.  There is no masses or any significant structures noted in the area of her  popping sensation.  Trace fluid noted in the pes anserine bursa that is not tender with pressure from the ultrasound probe.  No other fluid noted in any of the other bursae.  We discussed options available to patient.  We could potentially order CT scan primarily to look at the hardware but this seems to be more of a soft tissue pain generator for her.  After discussion of other options, she would like to hold off on any intervention for now and she will see how this improves over the coming weeks to months.  She has standing appointment with Dr. Addie in a couple months and if no improvement at that time or if she continues to be symptomatic, she will keep that appointment.  Follow-Up Instructions: No follow-ups on file.   Orders:  Orders Placed This Encounter  Procedures   XR KNEE 3 VIEW RIGHT   No orders of the defined types were placed in this encounter.   Imaging: No results found.  PMFS History: Patient Active Problem List   Diagnosis Date Noted   Arthritis of knee, right 10/01/2023   DJD (degenerative joint disease) of knee 09/03/2023   S/P total knee arthroplasty, right 09/03/2023   Unilateral primary osteoarthritis, right knee 12/28/2022   Pain in left ankle and joints of left foot 09/13/2022   Hypersomnia, persistent 08/21/2022   Excessive daytime  sleepiness 08/21/2022   MCI (mild cognitive impairment) 08/21/2022   History of breast cancer 08/21/2022   OSA on CPAP 08/21/2022   Pain in left shoulder 03/01/2021   Allergic rhinitis 02/21/2021   Anxiety 02/21/2021   Contact dermatitis due to plant 02/21/2021   Dysphagia 02/21/2021   Estrogen receptor negative status (ER-) 02/21/2021   Gastroesophageal reflux disease 02/21/2021   Gastroparesis 02/21/2021   Insomnia 02/21/2021   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   Morton's neuroma of left foot 02/21/2021   Personal history of malignant neoplasm of breast 02/21/2021   Vitamin D deficiency 02/21/2021    Oth nondisplaced dens fracture, init for clos fx (HCC) 02/04/2020   S/P cervical spinal fusion 01/22/2020   Memory deficit 01/14/2020   Sacroiliac dysfunction 01/14/2020   Neck pain 11/05/2019   Body mass index (BMI) 25.0-25.9, adult 11/05/2019   Unilateral primary osteoarthritis, right hip 09/01/2019   Trochanteric bursitis, left hip 07/14/2019   S/P lumbar fusion 05/18/2019   OSA (obstructive sleep apnea) 10/28/2018   Pain managed using patient-controlled analgesia (PCA) 10/25/2018   Fall 10/24/2018   Fall from ladder 10/23/2018   DDD (degenerative disc disease), lumbosacral 09/15/2018   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   Central line complication 10/22/2016   Genetic testing 05/15/2016   Family history of breast cancer    Family history of brain cancer    Breast cancer of upper-outer quadrant of left female breast (HCC) 03/20/2016   Transaminitis 10/03/2015   Symptomatic cholelithiasis 10/03/2015   RUQ abdominal pain    Lumbosacral neuritis 11/10/2014   Lumbar radiculopathy 10/19/2014   Depression 06/24/2013   RLS (restless legs syndrome) 06/24/2013   Low back pain 05/19/2013   Past Medical History:  Diagnosis Date   Anxiety    Arthritis    knees   Breast cancer (HCC)    Breast cancer of upper-outer quadrant of left female breast (HCC) 03/20/2016   C2 cervical fracture (HCC)    10/22/18, following fall from ladder   Cervicalgia    Cholecystitis    Complication of anesthesia    BP crashes post op   Early cataracts, bilateral    Family history of brain cancer    Family history of breast cancer    Hot flashes    Personal history of chemotherapy    Personal history of radiation therapy    Restless leg syndrome    Sleep apnea 10/03/2018   wears CPAP    Family History  Problem Relation Age of Onset   Heart disease Mother    Pulmonary fibrosis Mother    Hypertension Mother    Sleep apnea Mother    Cancer Father 7       astocytoma    Hypertension Brother    Lymphoma Maternal Aunt    Anesthesia problems Neg Hx     Past Surgical History:  Procedure Laterality Date   ANTERIOR CERVICAL DECOMP/DISCECTOMY FUSION N/A 01/22/2020   Procedure: ACDF - C2-C3;  Surgeon: Joshua Alm RAMAN, MD;  Location: [facility] OR;  Service: Neurosurgery;  Laterality: N/A;   BACK SURGERY     x2   BREAST BIOPSY     BREAST LUMPECTOMY Left 2018   BUNIONECTOMY     CHOLECYSTECTOMY N/A 10/04/2015   Procedure: LAPAROSCOPIC CHOLECYSTECTOMY WITH INTRAOPERATIVE CHOLANGIOGRAM;  Surgeon: Camellia Blush, MD;  Location: [facility] OR;  Service: General;  Laterality: N/A;   ELBOW SURGERY     right for epicondylitis 2010    ESOPHAGEAL MANOMETRY N/A 11/30/2020  Procedure: ESOPHAGEAL MANOMETRY (EM);  Surgeon: Saintclair Jasper, MD;  Location: [facility];  Service: Gastroenterology;  Laterality: N/A;   FOOT SURGERY     1983 -tarsal tunnel release   IR GENERIC HISTORICAL  10/26/2016   IR CV LINE INJECTION 10/26/2016 [facility]   LUMBAR DISC SURGERY  04/04/2012   MASS EXCISION Left 02/06/2021   Procedure: EXCISION LEFT FOOT SOFT TISSUE BETWEEN SECOND AND THIRD AND THIRD AND FOURTH TOES;  Surgeon: Burt Fus, DPM;  Location: [facility];  Service: Podiatry;  Laterality: Left;   PORT-A-CATH REMOVAL N/A 11/15/2016   Procedure: REMOVAL PORT-A-CATH;  Surgeon: Donnice Bury, MD;  Location: [facility];  Service: General;  Laterality: N/A;   PORTACATH PLACEMENT Right 04/02/2016   Procedure: INSERTION PORT-A-CATH WITH [place] ;  Surgeon: Donnice Bury, MD;  Location: [facility];  Service: General;  Laterality: Right;   RADIOACTIVE SEED GUIDED PARTIAL MASTECTOMY/AXILLARY SENTINEL NODE BIOPSY/AXILLARY NODE DISSECTION Left 09/12/2016   Procedure: LEFT BREAST SEED GUIDED LUMPECTOMY, LEFT AXILLARY SENTINEL NODE BIOPSY, LEFT SEED GUIDED AXILLARY NODE EXCISION( TARGETED AXILLARY DISSECTION), BLUE DYE INJECTION;  Surgeon: Donnice Bury, MD;  Location:  [facility];  Service: General;  Laterality: Left;   SACROILIAC JOINT FUSION Left 05/18/2020   Procedure: LEFT SACROILIAC JOINT FUSION;  Surgeon: Beuford Anes, MD;  Location: [facility] OR;  Service: Orthopedics;  Laterality: Left;   SPINAL FUSION  5/12   L5-S1   SPINAL FUSION  05/18/2019   L4-5   TARSAL TUNNEL RELEASE     TOTAL KNEE ARTHROPLASTY Right 09/03/2023   Procedure: RIGHT TOTAL KNEE ARTHROPLASTY;  Surgeon: Addie Cordella Hamilton, MD;  Location: [facility] OR;  Service: Orthopedics;  Laterality: Right;   Social History   Occupational History   Not on file  Tobacco Use   Smoking status: Never   Smokeless tobacco: Never  Vaping Use   Vaping status: Never Used  Substance and Sexual Activity   Alcohol use: Yes    Alcohol/week: 1.0 standard drink of alcohol    Types: 1 Cans of beer per week    Comment: Rarely 1/month   Drug use: No   Sexual activity: Not Currently    Birth control/protection: Post-menopausal

## 2023-12-12 DIAGNOSIS — M47816 Spondylosis without myelopathy or radiculopathy, lumbar region: Secondary | ICD-10-CM | POA: Diagnosis not present

## 2023-12-26 DIAGNOSIS — Z6837 Body mass index (BMI) 37.0-37.9, adult: Secondary | ICD-10-CM | POA: Diagnosis not present

## 2023-12-26 DIAGNOSIS — M533 Sacrococcygeal disorders, not elsewhere classified: Secondary | ICD-10-CM | POA: Diagnosis not present

## 2023-12-27 ENCOUNTER — Other Ambulatory Visit: Payer: Self-pay | Admitting: Neurological Surgery

## 2023-12-27 DIAGNOSIS — M533 Sacrococcygeal disorders, not elsewhere classified: Secondary | ICD-10-CM

## 2024-01-08 ENCOUNTER — Ambulatory Visit
Admission: RE | Admit: 2024-01-08 | Discharge: 2024-01-08 | Disposition: A | Discharge status: Home / Self Care | Payer: Medicare PPO | Source: Ambulatory Visit | Attending: Neurological Surgery | Admitting: Neurological Surgery

## 2024-01-08 ENCOUNTER — Other Ambulatory Visit: Payer: Self-pay | Admitting: Neurology

## 2024-01-08 DIAGNOSIS — M533 Sacrococcygeal disorders, not elsewhere classified: Secondary | ICD-10-CM | POA: Diagnosis not present

## 2024-01-08 DIAGNOSIS — Z981 Arthrodesis status: Secondary | ICD-10-CM | POA: Diagnosis not present

## 2024-01-08 MED ORDER — IOPAMIDOL (ISOVUE-M 200) INJECTION 41%
10.0000 mL | Freq: Once | INTRAMUSCULAR | Status: AC | PRN
  Administered 2024-01-08: 10 mL via INTRATHECAL

## 2024-01-08 NOTE — Telephone Encounter (Signed)
 Last seen on 07/22/23 Follow up scheduled on 03/09/24 Both doses filled on 12/15/23 for 90 day supply

## 2024-01-25 DIAGNOSIS — J209 Acute bronchitis, unspecified: Secondary | ICD-10-CM | POA: Diagnosis not present

## 2024-02-13 ENCOUNTER — Ambulatory Visit (INDEPENDENT_AMBULATORY_CARE_PROVIDER_SITE_OTHER): Admitting: Adult Health

## 2024-02-13 ENCOUNTER — Encounter: Payer: Self-pay | Admitting: Adult Health

## 2024-02-13 VITALS — BP 128/61 | HR 70 | Ht 64.0 in | Wt 216.0 lb

## 2024-02-13 DIAGNOSIS — G4733 Obstructive sleep apnea (adult) (pediatric): Secondary | ICD-10-CM | POA: Diagnosis not present

## 2024-02-13 DIAGNOSIS — G3184 Mild cognitive impairment, so stated: Secondary | ICD-10-CM

## 2024-02-13 DIAGNOSIS — G2581 Restless legs syndrome: Secondary | ICD-10-CM | POA: Diagnosis not present

## 2024-02-13 NOTE — Progress Notes (Signed)
 PATIENT: Alexis Fox DOB: 04/06/1956  REASON FOR VISIT: follow up HISTORY FROM: patient  Chief Complaint  Patient presents with   Follow-up    Patient in room #20 and alone. Patient states she is well and stable, with no new concerns. Patient would like to discus her Rx ClonazePam.     HISTORY OF PRESENT ILLNESS: Today 02/13/24:  Alexis Fox is a 68 y.o. female with a history of OSA on CPAP, RLS and memory complaints. Returns today for follow-up.  Overall she feels that she is doing well.  Restless leg seems to be controlled with Mirapex extended release and immediate release tablets.  She reports that PCP checked a ferritin level and it was high at 448.8.  She has stopped her iron supplement for now.  She feels that her memory has remained stable.  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  She states that this has been beneficial for her memory.  Continues to use CPAP.  Reports that she went 3 weeks without using it because she had an upper respiratory infection.  A 90-day download from November 15, 2023 to February 12, 2024 shows that she used the machine 64 out of 90 days for compliance of 71%.  Use her machine greater than 4 hours 49 days for compliance of 54%.  Residual AHI is 2 on 12 cmH2O with EPR 3.  Average usage is 5 hours and 9 minutes.   (Copied from Dr.Dohmeier's note for history ) Alexis Fox is a 68 y.o. female patient who is here for revisit 07/22/2023 for memory loss.  Chief concern according to patient :  I am suffering from memory loss. I am very sleepy and I feel exhausted. I am 7 years out from my triple negative breast cancer.  The patient was referred on 08-21-2022 to neuropsychology for testing. The meeting with dr Shelva Majestic has taken place but his testing battery is pending.  She travels a lot with her spouse and  go by RV.  Her follow-up visit with Dr. Kieth Brightly is scheduled for September.  He described Alexis Fox is a 68 year old Caucasian female who used to work in the emergency department, and has had met memory difficulties and cognitive changes that the onset dating to the time shortly after her chemotherapy in 2017.  She has a history of obstructive sleep apnea restless legs REM behavior disorder the patient has been using a nasal cradle mask but she did not particularly feel good with CPAP use.  She is a constant saliva for breast cancer.  The excessive daytime sleepiness and ongoing memory difficulties have caused concern to her.  She endorsed an Epworth score of 18 out of 24 even with compliance of CPAP use.  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  She had also a back fusion surgery L4-S1 in 2013 and in 2017 she started on Celexa and Wellbutrin but continues at this time only on Celexa.  She relies more more on her husband for some basic recall and memory issues this can be short-term but also immediate term memory changes.  Her compliance with CPAP was 87% by days and 70% by hours with an average of 5 hours 22 minutes on those days that the device was used.  Her CPAP is set to 12 cm of water with 3 cm expiratory relief and her residual AHI is 1.9/h.  The residuals are obstructive.  She  does not have Cheyne-Stokes respirations.  She was set up in 2020 May 18 so next year her machine would be due for a replacement.  Based on her memory concerns we did perform a Montreal cognitive assessment today and she scored 30 out of 30 points, before she confessed that the Med assitsent had helped her , the true score would be 27/ 30.         HISTORY:  Alexis Fox underwent baseline PSG on 09-28-2018 at [facility] and was diagnosed with obstructive sleep apnea at an [facility] 19.2/h and REM AHI was 26.9/h. No clinically significant hypoxemia was noted. She returns for CPAP titration. .The patient endorsed the Epworth Sleepiness Scale at 12/24 points.   The patient's weight 194  pounds with a height of 64 (inches), resulting in a BMI of 33.1 kg/m2. The patient's neck circumference measured 14 inches.   Primary Snoring and Obstructive Sleep Apnea, Upper airway rsistance- responding well to CPAP.   Upper Airway Resistance Syndrome was controlled at CPAP of10 cm water pressure during supine sleep, and 8 cm water in non-supine.       Alexis Fox is a 68 y.o. female patient  , seen here on 09-15-2018  in a referral/ revisit from Dr. Valentina Lucks for restless legs , having increased in intensity.      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  She has had restless leg symptoms for most of her life, she was last seen here 2014 after a sleep study had been obtained through [facility] health systems, at the time ordered by Dr. Kirby Funk and interpreted by Ander Purpura, she did have somewhat fragmented sleep at the time but there was no significant apnea noted she did snore loudly but did not wake up from snoring, she did not have periodic limb movements at the time and her sleep architecture revealed a delayed REM sleep onset and lower REM sleep proportion which was not surprising as she was at the time taking clonazepam citalopram and pramipexole as well as bupropion.   Her current symptoms include memory loss, confusion, headaches, insomnia which has been present since 1998, daytime sleepiness, loud snoring and restless legs have resurfaced by liver for a while successfully treated.  She does not have a diagnosis of an anxiety disorder question if PTSD, chronic back pain breast cancer diagnosed in April 2017, lumpectomy 2017, fusion back surgery L5-S1 in May 2013, laparoscopic cholecystectomy in 2016 January, facet injections bilateral at L4-5.     02/15/21: Alexis Fox is a 68 year old female with a history of obstructive sleep apnea on CPAP and restless legs.  She returns today for follow-up.  Her download indicates that her CPAP is working well but she doesn't like using it. Srtates that she has to sleep supine but prefers to sleep on her side or stomach.  At the last visit we increased her pressure to 12 and her residual AHI has been reduced.  Her download is below.  Restless legs: She takes Mirapex 1 tablet in the morning and 1/2 tablet at bedtime. Reports that she is having more symptoms during the day. Reports that [place] States that when she is sitting for  long periods she gets more symptoms in the legs and arms. She also will get nerve sensitivity down both legs starting mid buttocks. She has had multiple back surgeries in the past.     02/16/20: Alexis Fox is a 68 year old female with a history of obstructive sleep apnea on CPAP and restless leg syndrome.  She reports that Mirapex continues to work well for her.  This is currently being prescribed by her PCP.  Her CPAP download indicates that she use her machine 28 out of 30 days for compliance of 93%.  She used her machine greater than 4 hours 24 days for compliance of 80%.  On average she uses her machine 6 hours and 39 minutes.  Her residual AHI is 5.7 on 11 cm of water with EPR of 3.  Leak in the 95th percentile is 23.1 L/min.  She reports that the CPAP is working well for her.    HISTORY 06/18/19:   Alexis Fox is a 68 year old female with a history of obstructive sleep apnea on CPAP and restless leg syndrome.  She returns today for follow-up.  Her CPAP download indicates that she use her machine 30 out of 30 days for compliance of 100%.  She use her machine greater than 4 hours 28 days for compliance of 93%.  On average she uses her machine 6 hours and 42 minutes.  Her residual AHI is 3.7 on 11 cm of water with EPR of 3.  Her leak in  the 95th percentile is 31.3 L/min.  She currently has the nasal pillows but states that she would prefer the dream wear mask.  She states that there was some issue with her DME company that she cannot obtain this.  She states that her restless legs have been bothering her.  She states that she has not gotten much sleep in the last 2 nights.  She is currently taking Mirapex 0.5 mg at bedtime.  She is not taking half a tablet in the afternoon as instructed by Dr. Vickey Huger.  She states that she thought she was supposed to take Klonopin in the afternoons.  She returns today for follow-up.  REVIEW OF SYSTEMS: Out of a complete 14 system review of symptoms, the patient complains only of the following symptoms, and all other reviewed systems are negative.  See HPI  ALLERGIES: Allergies  Allergen Reactions   Turmeric Diarrhea    Severe diarrhea   Decadron [Dexamethasone]     Exacerbates restless leg    HOME MEDICATIONS: Outpatient Medications Prior to Visit  Medication Sig Dispense Refill   acetaminophen (TYLENOL) 500 MG tablet Take 1,000 mg by mouth every 8 (eight) hours as needed for moderate pain.     Ascorbic Acid 500 MG CAPS 1 tablet Orally Once a day     benzonatate (TESSALON) 200 MG capsule Take 200 mg by mouth 2 (two) times daily as needed.     buPROPion (WELLBUTRIN XL) 150 MG 24 hr tablet Take 150 mg by mouth daily.  4   cetirizine (ZYRTEC) 10 MG tablet Take 10 mg by mouth at bedtime.     Cholecalciferol (VITAMIN D) 50 MCG (2000 UT) tablet Take 2,000 Units by mouth 2 (two) times daily.     citalopram (CELEXA) 40 MG tablet Take 40 mg by mouth at bedtime.   3   clobetasol ointment (TEMOVATE) 0.05 % Apply 1 application topically 2 (two) times daily. (Patient taking differently: Apply 1 application  topically 2 (two) times daily as needed (irritation).)  30 g 1   clonazePAM (KLONOPIN) 1 MG tablet Take 1 mg by mouth at bedtime.      docusate sodium (COLACE) 100 MG capsule Take 100 mg by mouth 2  (two) times daily.     Ferrous Sulfate (IRON) 325 (65 Fe) MG TABS Take 325 mg by mouth every Monday, Wednesday, and Friday.     fluticasone (FLONASE) 50 MCG/ACT nasal spray Place 1 spray into both nostrils daily as needed for allergies.     gabapentin (NEURONTIN) 300 MG capsule Take 300 mg by mouth at bedtime.     Multiple Vitamins-Minerals (MULTIVITAMIN WITH MINERALS) tablet Take 1 tablet by mouth at bedtime.     pramipexole (MIRAPEX) 0.5 MG tablet Take 1 tablet (0.5 mg total) by mouth daily as needed. TAKE 1 TABLET (0.5 MG TOTAL) BY MOUTH DAILY AS NEEDED (TAKE ALONG WITH EXTENDED RELEASE DOSE) Strength: 0.5 mg 90 tablet 1   Pramipexole Dihydrochloride (MIRAPEX ER) 0.75 MG TB24 Take 1 tablet (0.75 mg total) by mouth at bedtime. 90 tablet 1   Ascorbic Acid (VITAMIN C) 500 MG CAPS Take 500 mg by mouth every Monday, Wednesday, and Friday.     aspirin 81 MG chewable tablet Chew 1 tablet (81 mg total) by mouth 2 (two) times daily. 60 tablet 0   celecoxib (CELEBREX) 100 MG capsule TAKE 1 CAPSULE BY MOUTH TWICE A DAY 60 capsule 0   methocarbamol (ROBAXIN) 500 MG tablet Take 1 tablet (500 mg total) by mouth every 8 (eight) hours as needed for muscle spasms. 30 tablet 0   oxyCODONE (OXY IR/ROXICODONE) 5 MG immediate release tablet Take 1 tablet (5 mg total) by mouth every 6 (six) hours as needed for moderate pain. 30 tablet 0   Facility-Administered Medications Prior to Visit  Medication Dose Route Frequency Provider Last Rate Last Admin   sodium chloride flush (NS) 0.9 % injection 10 mL  10 mL Intracatheter PRN Serena Croissant, MD   10 mL at 07/19/16 1434    PAST MEDICAL HISTORY: Past Medical History:  Diagnosis Date   Anxiety    Arthritis    knees   Breast cancer (HCC)    Breast cancer of upper-outer quadrant of left female breast (HCC) 03/20/2016   C2 cervical fracture (HCC)    10/22/18, following fall from ladder   Cervicalgia    Cholecystitis    Complication of anesthesia    BP "crashes"  post op   Early cataracts, bilateral    Family history of brain cancer    Family history of breast cancer    Hot flashes    Personal history of chemotherapy    Personal history of radiation therapy    Restless leg syndrome    Sleep apnea 10/03/2018   wears CPAP    PAST SURGICAL HISTORY: Past Surgical History:  Procedure Laterality Date   ANTERIOR CERVICAL DECOMP/DISCECTOMY FUSION N/A 01/22/2020   Procedure: ACDF - C2-C3;  Surgeon: Tia Alert, MD;  Location: [facility] OR;  Service: Neurosurgery;  Laterality: N/A;   BACK SURGERY     x2   BREAST BIOPSY     BREAST LUMPECTOMY Left 2018   BUNIONECTOMY     CHOLECYSTECTOMY N/A 10/04/2015   Procedure: LAPAROSCOPIC CHOLECYSTECTOMY WITH INTRAOPERATIVE CHOLANGIOGRAM;  Surgeon: Gaynelle Adu, MD;  Location: [facility] OR;  Service: General;  Laterality: N/A;   ELBOW SURGERY     right for epicondylitis 2010    ESOPHAGEAL MANOMETRY N/A 11/30/2020   Procedure: ESOPHAGEAL MANOMETRY (EM);  Surgeon: Kerin Salen,  MD;  Location: [facility];  Service: Gastroenterology;  Laterality: N/A;   FOOT SURGERY     1983 -tarsal tunnel release   IR GENERIC HISTORICAL  10/26/2016   IR CV LINE INJECTION 10/26/2016 [facility]   LUMBAR DISC SURGERY  04/04/2012   MASS EXCISION Left 02/06/2021   Procedure: EXCISION LEFT FOOT SOFT TISSUE BETWEEN SECOND AND THIRD AND THIRD AND FOURTH TOES;  Surgeon: Asencion Islam, DPM;  Location: [facility];  Service: Podiatry;  Laterality: Left;   PORT-A-CATH REMOVAL N/A 11/15/2016   Procedure: REMOVAL PORT-A-CATH;  Surgeon: Emelia Loron, MD;  Location: [facility];  Service: General;  Laterality: N/A;   PORTACATH PLACEMENT Right 04/02/2016   Procedure: INSERTION PORT-A-CATH WITH [place];  Surgeon: Emelia Loron, MD;  Location: [facility];  Service: General;  Laterality: Right;   RADIOACTIVE SEED GUIDED PARTIAL MASTECTOMY/AXILLARY SENTINEL NODE BIOPSY/AXILLARY NODE DISSECTION Left 09/12/2016    Procedure: LEFT BREAST SEED GUIDED LUMPECTOMY, LEFT AXILLARY SENTINEL NODE BIOPSY, LEFT SEED GUIDED AXILLARY NODE EXCISION( TARGETED AXILLARY DISSECTION), BLUE DYE INJECTION;  Surgeon: Emelia Loron, MD;  Location: [facility];  Service: General;  Laterality: Left;   SACROILIAC JOINT FUSION Left 05/18/2020   Procedure: LEFT SACROILIAC JOINT FUSION;  Surgeon: Estill Bamberg, MD;  Location: [facility] OR;  Service: Orthopedics;  Laterality: Left;   SPINAL FUSION  5/12   L5-S1   SPINAL FUSION  05/18/2019   L4-5   TARSAL TUNNEL RELEASE     TOTAL KNEE ARTHROPLASTY Right 09/03/2023   Procedure: RIGHT TOTAL KNEE ARTHROPLASTY;  Surgeon: Cammy Copa, MD;  [facility] OR;  Service: Orthopedics;  Laterality: Right;    FAMILY HISTORY: Family History  Problem Relation Age of Onset   Heart disease Mother    Pulmonary fibrosis Mother    Hypertension Mother    Sleep apnea Mother    Cancer Father 27       astocytoma   Hypertension Brother    Lymphoma Maternal Aunt    Anesthesia problems Neg Hx     SOCIAL HISTORY: Social History   Socioeconomic History   Marital status: Married    Spouse name: Not on file   Number of children: 2   Years of education: Not on file   Highest education level: Not on file  Occupational History   Not on file  Tobacco Use   Smoking status: Never   Smokeless tobacco: Never  Vaping Use   Vaping status: Never Used  Substance and Sexual Activity   Alcohol use: Yes    Alcohol/week: 1.0 standard drink of alcohol    Types: 1 Cans of beer per week    Comment: Rarely 1/month   Drug use: No   Sexual activity: Not Currently    Birth control/protection: Post-menopausal  Other Topics Concern   Not on file  Social History Narrative   Not on file   Social Drivers of Health   Financial Resource Strain: Low Risk  (10/24/2018)   Overall Financial Resource Strain (CARDIA)    Difficulty of Paying Living Expenses: Not hard at all  Food Insecurity: No  Food Insecurity (09/04/2023)   Hunger Vital Sign    Worried About Running Out of Food in the Last Year: Never true    Ran Out of Food in the Last Year: Never true  Transportation Needs: No Transportation Needs (09/04/2023)   PRAPARE - Administrator, Civil Service (Medical): No    Lack of Transportation (Non-Medical): No  Physical Activity: Unknown (10/24/2018)   Exercise Vital Sign    Days of Exercise per Week: 0 days    Minutes of Exercise per Session: Not on file  Stress: No Stress Concern Present (10/24/2018)   [employer] of Occupational Health - Occupational Stress Questionnaire    Feeling of Stress : Not at all  Social Connections: Not on file  Intimate Partner Violence: Not At Risk (09/04/2023)   Humiliation, Afraid, Rape, and Kick questionnaire    Fear of Current or Ex-Partner: No    Emotionally Abused: No    Physically Abused: No    Sexually Abused: No      PHYSICAL EXAM  Vitals:   02/13/24 0951  BP: 128/61  Pulse: 70  Weight: 216 lb (98 kg)  Height: 5\' 4"  (1.626 m)   Body mass index is 37.08 kg/m.     02/13/2024   10:12 AM 07/22/2023    2:04 PM 08/21/2022    2:04 PM  Montreal Cognitive Assessment   Visuospatial/ Executive (0/5) 5 5 5   Naming (0/3) 3 3 3   Attention: Read list of digits (0/2) 2 2 2   Attention: Read list of letters (0/1) 1 1 1   Attention: Serial 7 subtraction starting at 100 (0/3) 3 3 3   Language: Repeat phrase (0/2) 2 2 2   Language : Fluency (0/1) 1 1 1   Abstraction (0/2) 2 2 2   Delayed Recall (0/5) 5 5 2   Orientation (0/6) 5 6 6   Total 29 30 27   Adjusted Score (based on education)   27     Generalized: Well developed, in no acute distress  Chest: Lungs clear to auscultation bilaterally  Neurological examination  Mentation: Alert oriented to time, place, history taking. Follows all commands speech and language fluent Cranial nerve II-XII: Extraocular movements were full, visual field were full on confrontational test  Head turning and shoulder shrug  were normal and symmetric. Motor: The motor testing reveals 5 over 5 strength of all 4 extremities. Good symmetric motor tone is noted throughout.  Sensory: Sensory testing is intact to soft touch on all 4 extremities. No evidence of extinction is noted.  Gait and station: Gait is normal.    DIAGNOSTIC DATA (LABS, IMAGING, TESTING) - I reviewed patient records, labs, notes, testing and imaging myself where available.  Lab Results  Component Value Date   WBC 8.0 08/21/2023   HGB 12.9 08/21/2023   HCT 40.9 08/21/2023   MCV 94.2 08/21/2023   PLT 254 08/21/2023      Component Value Date/Time   NA 134 (L) 08/21/2023 1400   NA 139 05/27/2017 1126   K 4.6 08/21/2023 1400   K 4.0 05/27/2017 1126   CL 103 08/21/2023 1400   CO2 24 08/21/2023 1400   CO2 28 05/27/2017 1126   GLUCOSE 92 08/21/2023 1400   GLUCOSE 92 05/27/2017 1126   BUN 18 08/21/2023 1400   BUN 17.9 05/27/2017 1126   CREATININE 0.85 08/21/2023 1400   CREATININE 1.0 05/27/2017 1126   CALCIUM 8.9 08/21/2023 1400   CALCIUM 10.0 05/27/2017 1126   PROT 6.7 05/10/2020 1344   PROT 6.9 09/15/2018 1310   PROT 7.4 05/27/2017 1126   ALBUMIN 3.6 05/10/2020 1344   ALBUMIN 3.9 05/27/2017 1126   AST 22 05/10/2020 1344   AST 28 05/27/2017 1126   ALT 20 05/10/2020 1344   ALT 52 05/27/2017 1126   ALKPHOS 68 05/10/2020 1344   ALKPHOS 69 05/27/2017 1126   BILITOT 0.5 05/10/2020 1344  BILITOT 0.85 05/27/2017 1126   GFRNONAA >60 08/21/2023 1400   GFRAA >60 05/10/2020 1344    Lab Results  Component Value Date   TSH 2.680 09/15/2018      ASSESSMENT AND PLAN 68 y.o. year old female  has a past medical history of Anxiety, Arthritis, Breast cancer (HCC), Breast cancer of upper-outer quadrant of left female breast (HCC) (03/20/2016), C2 cervical fracture (HCC), Cervicalgia, Cholecystitis, Complication of anesthesia, Early cataracts, bilateral, Family history of brain cancer, Family history of  breast cancer, Hot flashes, Personal history of chemotherapy, Personal history of radiation therapy, Restless leg syndrome, and Sleep apnea (10/03/2018). here with:  OSA on CPAP  - CPAP compliance excellent - Residual AHI is in normal range  - Discuss Inspire device  - Encouraged patient to continue using CPAP nightly and greater than 4 hours each night  2.  Restless leg syndrome   -Continue Mirapex ER 0.75 mg daily  - Continue Mirapex immediate release 0.5 mg tablet daily PRN for breakthrough symptoms - We will refill Klonopin 1 mg at bedtime when due. Discussed with Dr. Vickey Huger and she was ok to take over the prescription from her PCP.  3. Memory complaints   - FU with Dr. Kieth Brightly for results on 04/2024 - MOCA  29/30  - F/U in 6 to 7 months or sooner if needed  Butch Penny, MSN, NP-C 02/13/2024, 9:59 AM [facility] [address], [address] [place], [place] [address] [phone]

## 2024-02-13 NOTE — Patient Instructions (Signed)
 Your Plan:  Continue using CPAP nightly and greater than 4 hours each night Continue Mirapex  If your symptoms worsen or you develop new symptoms please let us know.       Thank you for coming to see [place] at [facility]. I hope we have been able to provide you high quality care today.  You may receive a patient satisfaction survey over the next few weeks. We would appreciate your feedback and comments so that we may continue to improve ourselves and the health of our patients.

## 2024-02-17 ENCOUNTER — Other Ambulatory Visit: Payer: Self-pay | Admitting: *Deleted

## 2024-02-17 MED ORDER — CLONAZEPAM 1 MG PO TABS: 1.0000 mg | ORAL_TABLET | Freq: Every day | ORAL | 5 refills | Status: DC

## 2024-02-17 NOTE — Addendum Note (Signed)
 Addended by: Enedina Finner on: 02/17/2024 12:51 PM   Modules accepted: Orders

## 2024-02-17 NOTE — Telephone Encounter (Signed)
 Last seen 02-13-2024, last fill 01-12-2024 per [facility] [address] [place], . Next appt 09-24-2024.

## 2024-02-24 ENCOUNTER — Ambulatory Visit: Payer: Medicare PPO | Admitting: Orthopedic Surgery

## 2024-02-25 DIAGNOSIS — Z6837 Body mass index (BMI) 37.0-37.9, adult: Secondary | ICD-10-CM | POA: Diagnosis not present

## 2024-02-25 DIAGNOSIS — M533 Sacrococcygeal disorders, not elsewhere classified: Secondary | ICD-10-CM | POA: Diagnosis not present

## 2024-02-26 ENCOUNTER — Encounter: Payer: Self-pay | Admitting: Hematology and Oncology

## 2024-02-26 ENCOUNTER — Encounter: Payer: Self-pay | Admitting: Orthopedic Surgery

## 2024-02-26 ENCOUNTER — Ambulatory Visit: Admitting: Orthopedic Surgery

## 2024-02-26 DIAGNOSIS — Z96651 Presence of right artificial knee joint: Secondary | ICD-10-CM | POA: Diagnosis not present

## 2024-02-26 NOTE — Progress Notes (Signed)
 Office Visit Note   Patient: Alexis Fox           Date of Birth: May 04, 1956           MRN: [id number] Visit Date: 02/26/2024 Requested by: Thana Ates, MD [address] [place],  [place] [address] PCP: Thana Ates, MD  Subjective: Chief Complaint  Patient presents with   Right Knee - Pain, Follow-up    HPI: Yailine Ballard is a 68 y.o. female who presents to the office reporting continued right knee pain.  She underwent right total knee replacement 09/03/2023.  Pain has been worsening since last office visit.  Reports some anterior pressure.  Saw Dr. Yetta Barre her neurosurgeon yesterday.  They may need to extend her fusion up to L1-2.  She did have a injection into her back as well as into the right SI joint.  The right SI joint injection did not help her localize back pain.  She does take vitamin D.  Had 1 episode where she had a pop in that knee early after surgery but everything functionally returned since that time.  She just reports some worsening pain.  Denies any fevers or chills.  Radiographs look good in terms of no loosening and no component settling..                ROS: All systems reviewed are negative as they relate to the chief complaint within the history of present illness.  Patient denies fevers or chills.  Assessment & Plan: Visit Diagnoses:  1. S/P total knee arthroplasty, right     Plan: Impression is persistent right knee pain with unclear etiology.  Does not appear to be related to the hip.  I think it could be related to the back.  From a total knee replacement standpoint the implant does not appear to be loose infected or malaligned.  No real diagnostic imaging modalities would necessarily be predictably helpful to determine what exactly is causing some of this pain.  The pain is not extending down the leg so I think it is unlikely to be radicular from the lower lumbar level.  I think it could be radicular from the upper lumbar level.   37-month return with repeat radiographs at that time.  Follow-Up Instructions: No follow-ups on file.   Orders:  No orders of the defined types were placed in this encounter.  No orders of the defined types were placed in this encounter.     Procedures: No procedures performed   Clinical Data: No additional findings.  Objective: Vital Signs: There were no vitals taken for this visit.  Physical Exam:  Constitutional: Patient appears well-developed HEENT:  Head: Normocephalic Eyes:EOM are normal Neck: Normal range of motion Cardiovascular: Normal rate Pulmonary/chest: Effort normal Neurologic: Patient is alert Skin: Skin is warm Psychiatric: Patient has normal mood and affect  Ortho Exam: Ortho exam demonstrates slightly antalgic gait to the right.  No nerve root tension signs in either knee.  Right knee has range of motion of 0 to about 1 15-1 20 of flexion with good stability to varus valgus stress at 0 30 degrees.  There is no posterolateral rotatory instability.  Extensor mechanism intact and nontender.  No temperature difference or color difference right leg versus left leg.  Specialty Comments:  No specialty comments available.  Imaging: No results found.   PMFS History: Patient Active Problem List   Diagnosis Date Noted   Arthritis of knee, right 10/01/2023  DJD (degenerative joint disease) of knee 09/03/2023   S/P total knee arthroplasty, right 09/03/2023   Unilateral primary osteoarthritis, right knee 12/28/2022   Pain in left ankle and joints of left foot 09/13/2022   Hypersomnia, persistent 08/21/2022   Excessive daytime sleepiness 08/21/2022   MCI (mild cognitive impairment) 08/21/2022   History of breast cancer 08/21/2022   OSA on CPAP 08/21/2022   Pain in left shoulder 03/01/2021   Allergic rhinitis 02/21/2021   Anxiety 02/21/2021   Contact dermatitis due to plant 02/21/2021   Dysphagia 02/21/2021   Estrogen receptor negative status (ER-)  02/21/2021   Gastroesophageal reflux disease 02/21/2021   Gastroparesis 02/21/2021   Insomnia 02/21/2021   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   Morton's neuroma of left foot 02/21/2021   Personal history of malignant neoplasm of breast 02/21/2021   Vitamin D deficiency 02/21/2021   Oth nondisplaced dens fracture, init for clos fx (HCC) 02/04/2020   S/P cervical spinal fusion 01/22/2020   Memory deficit 01/14/2020   Sacroiliac dysfunction 01/14/2020   Neck pain 11/05/2019   Body mass index (BMI) 25.0-25.9, adult 11/05/2019   Unilateral primary osteoarthritis, right hip 09/01/2019   Trochanteric bursitis, left hip 07/14/2019   S/P lumbar fusion 05/18/2019   OSA (obstructive sleep apnea) 10/28/2018   Pain managed using patient-controlled analgesia (PCA) 10/25/2018   Fall 10/24/2018   Fall from ladder 10/23/2018   DDD (degenerative disc disease), lumbosacral 09/15/2018   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   Central line complication 10/22/2016   Genetic testing 05/15/2016   Family history of breast cancer    Family history of brain cancer    Breast cancer of upper-outer quadrant of left female breast (HCC) 03/20/2016   Transaminitis 10/03/2015   Symptomatic cholelithiasis 10/03/2015   RUQ abdominal pain    Lumbosacral neuritis 11/10/2014   Lumbar radiculopathy 10/19/2014   Depression 06/24/2013   RLS (restless legs syndrome) 06/24/2013   Low back pain 05/19/2013   Past Medical History:  Diagnosis Date   Anxiety    Arthritis    knees   Breast cancer (HCC)    Breast cancer of upper-outer quadrant of left female breast (HCC) 03/20/2016   C2 cervical fracture (HCC)    10/22/18, following fall from ladder   Cervicalgia    Cholecystitis    Complication of anesthesia    BP "crashes" post op   Early cataracts, bilateral    Family history of brain cancer    Family history of breast cancer    Hot flashes    Personal history of chemotherapy     Personal history of radiation therapy    Restless leg syndrome    Sleep apnea 10/03/2018   wears CPAP    Family History  Problem Relation Age of Onset   Heart disease Mother    Pulmonary fibrosis Mother    Hypertension Mother    Sleep apnea Mother    Cancer Father 48       astocytoma   Hypertension Brother    Lymphoma Maternal Aunt    Anesthesia problems Neg Hx     Past Surgical History:  Procedure Laterality Date   ANTERIOR CERVICAL DECOMP/DISCECTOMY FUSION N/A 01/22/2020   Procedure: ACDF - C2-C3;  Surgeon: Tia Alert, MD;  Location: [facility] OR;  Service: Neurosurgery;  Laterality: N/A;   BACK SURGERY     x2   BREAST BIOPSY     BREAST LUMPECTOMY Left 2018   BUNIONECTOMY     CHOLECYSTECTOMY N/A  10/04/2015   Procedure: LAPAROSCOPIC CHOLECYSTECTOMY WITH INTRAOPERATIVE CHOLANGIOGRAM;  Surgeon: Gaynelle Adu, MD;  Location: [facility] OR;  Service: General;  Laterality: N/A;   ELBOW SURGERY     right for epicondylitis 2010    ESOPHAGEAL MANOMETRY N/A 11/30/2020   Procedure: ESOPHAGEAL MANOMETRY (EM);  Surgeon: Kerin Salen, MD;  Location: [facility];  Service: Gastroenterology;  Laterality: N/A;   FOOT SURGERY     1983 -tarsal tunnel release   IR GENERIC HISTORICAL  10/26/2016   IR CV LINE INJECTION 10/26/2016 [facility]   LUMBAR DISC SURGERY  04/04/2012   MASS EXCISION Left 02/06/2021   Procedure: EXCISION LEFT FOOT SOFT TISSUE BETWEEN SECOND AND THIRD AND THIRD AND FOURTH TOES;  Surgeon: Asencion Islam, DPM;  Location: [facility];  Service: Podiatry;  Laterality: Left;   PORT-A-CATH REMOVAL N/A 11/15/2016   Procedure: REMOVAL PORT-A-CATH;  Surgeon: Emelia Loron, MD;  Location: [facility];  Service: General;  Laterality: N/A;   PORTACATH PLACEMENT Right 04/02/2016   Procedure: INSERTION PORT-A-CATH WITH [place];  Surgeon: Emelia Loron, MD;  Location: [facility];  Service: General;  Laterality: Right;   RADIOACTIVE SEED GUIDED PARTIAL  MASTECTOMY/AXILLARY SENTINEL NODE BIOPSY/AXILLARY NODE DISSECTION Left 09/12/2016   Procedure: LEFT BREAST SEED GUIDED LUMPECTOMY, LEFT AXILLARY SENTINEL NODE BIOPSY, LEFT SEED GUIDED AXILLARY NODE EXCISION( TARGETED AXILLARY DISSECTION), BLUE DYE INJECTION;  Surgeon: Emelia Loron, MD;  Location:  SURGERY CENTER;  Service: General;  Laterality: Left;   SACROILIAC JOINT FUSION Left 05/18/2020   Procedure: LEFT SACROILIAC JOINT FUSION;  Surgeon: Estill Bamberg, MD;  Location: [facility] OR;  Service: Orthopedics;  Laterality: Left;   SPINAL FUSION  5/12   L5-S1   SPINAL FUSION  05/18/2019   L4-5   TARSAL TUNNEL RELEASE     TOTAL KNEE ARTHROPLASTY Right 09/03/2023   Procedure: RIGHT TOTAL KNEE ARTHROPLASTY;  Surgeon: Cammy Copa, MD;  Location: [facility] OR;  Service: Orthopedics;  Laterality: Right;   Social History   Occupational History   Not on file  Tobacco Use   Smoking status: Never   Smokeless tobacco: Never  Vaping Use   Vaping status: Never Used  Substance and Sexual Activity   Alcohol use: Yes    Alcohol/week: 1.0 standard drink of alcohol    Types: 1 Cans of beer per week    Comment: Rarely 1/month   Drug use: No   Sexual activity: Not Currently    Birth control/protection: Post-menopausal

## 2024-03-09 ENCOUNTER — Ambulatory Visit: Payer: Medicare PPO | Admitting: Adult Health

## 2024-03-12 ENCOUNTER — Encounter: Payer: Self-pay | Admitting: Neurology

## 2024-03-13 MED ORDER — PRAMIPEXOLE DIHYDROCHLORIDE 0.5 MG PO TABS: 0.5000 mg | ORAL_TABLET | Freq: Every day | ORAL | 1 refills | Status: DC | PRN

## 2024-03-13 MED ORDER — CLONAZEPAM 1 MG PO TABS: 1.0000 mg | ORAL_TABLET | Freq: Every day | ORAL | 1 refills | Status: DC

## 2024-03-13 MED ORDER — PRAMIPEXOLE DIHYDROCHLORIDE ER 0.75 MG PO TB24: 0.7500 mg | ORAL_TABLET | Freq: Every day | ORAL | 1 refills | Status: DC

## 2024-03-19 ENCOUNTER — Ambulatory Visit: Admitting: Orthopedic Surgery

## 2024-03-19 DIAGNOSIS — G8929 Other chronic pain: Secondary | ICD-10-CM | POA: Diagnosis not present

## 2024-03-19 DIAGNOSIS — M25561 Pain in right knee: Secondary | ICD-10-CM | POA: Diagnosis not present

## 2024-03-19 DIAGNOSIS — J40 Bronchitis, not specified as acute or chronic: Secondary | ICD-10-CM | POA: Diagnosis not present

## 2024-03-20 ENCOUNTER — Encounter: Payer: Self-pay | Admitting: Orthopedic Surgery

## 2024-03-20 NOTE — Progress Notes (Signed)
 Office Visit Note   Patient: Alexis Fox           Date of Birth: 01/22/1956           MRN: [id number] Visit Date: 03/19/2024 Requested by: Tena Feeling, MD [address] [place],  [place] [address] PCP: Tena Feeling, MD  Subjective: Chief Complaint  Patient presents with   Right Knee - Follow-up    09/03/23 right TKA    HPI: Alexis Fox is a 68 y.o. female who presents to the office reporting continued right knee pain which is more muscular around the area of the vastus lateralis.  Underwent right total knee replacement 09/03/2023.  Describes some pain and difficulty with squatting and bending.  Wants to follow-up her knee replacement to make sure it is good before she proceeds with lumbar spine workup.  She does have MRI scheduled for May 8..  She feels like she may have backtracked some in terms of her progress compared to where she was 3 months ago.              ROS: All systems reviewed are negative as they relate to the chief complaint within the history of present illness.  Patient denies fevers or chills.  Assessment & Plan: Visit Diagnoses:  1. Chronic pain of right knee     Plan: Impression is right knee pain which does not really look like it is an easily identifiable problem related to the knee replacement.  Plain radiographs look good.  Ultrasound of that area does not demonstrate any morphological abnormality of the soft tissues.  Plan at this time is 52-month return for final check.  Continue with lumbar spine workup with Dr. Rochelle Chu.  If her symptoms change to include weakness swelling instability localizing to the knee we can always reevaluate.  Follow-Up Instructions: Return in about 6 months (around 09/18/2024).   Orders:  No orders of the defined types were placed in this encounter.  No orders of the defined types were placed in this encounter.     Procedures: No procedures performed   Clinical Data: No additional  findings.  Objective: Vital Signs: There were no vitals taken for this visit.  Physical Exam:  Constitutional: Patient appears well-developed HEENT:  Head: Normocephalic Eyes:EOM are normal Neck: Normal range of motion Cardiovascular: Normal rate Pulmonary/chest: Effort normal Neurologic: Patient is alert Skin: Skin is warm Psychiatric: Patient has normal mood and affect  Ortho Exam: Ortho exam demonstrates full active and passive range of motion of the left knee with no nerve root tension signs.  Right knee also has excellent range of motion with no effusion warmth and no patellar instability.  Collaterals are stable to varus and valgus stress.  Extensor mechanism is intact.  Has some tenderness to palpation about 3 fingerbreadths proximal and lateral to the superior lateral pole of the patella.  Ultrasound of this area demonstrates no structural abnormality of the muscular and fascial tissue.  Specialty Comments:  No specialty comments available.  Imaging: No results found.   PMFS History: Patient Active Problem List   Diagnosis Date Noted   Arthritis of knee, right 10/01/2023   DJD (degenerative joint disease) of knee 09/03/2023   S/P total knee arthroplasty, right 09/03/2023   Unilateral primary osteoarthritis, right knee 12/28/2022   Pain in left ankle and joints of left foot 09/13/2022   Hypersomnia, persistent 08/21/2022   Excessive daytime sleepiness 08/21/2022   MCI (mild cognitive impairment) 08/21/2022  History of breast cancer 08/21/2022   OSA on CPAP 08/21/2022   Pain in left shoulder 03/01/2021   Allergic rhinitis 02/21/2021   Anxiety 02/21/2021   Contact dermatitis due to plant 02/21/2021   Dysphagia 02/21/2021   Estrogen receptor negative status (ER-) 02/21/2021   Gastroesophageal reflux disease 02/21/2021   Gastroparesis 02/21/2021   Insomnia 02/21/2021   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   Morton's neuroma of left foot  02/21/2021   Personal history of malignant neoplasm of breast 02/21/2021   Vitamin D deficiency 02/21/2021   Oth nondisplaced dens fracture, init for clos fx (HCC) 02/04/2020   S/P cervical spinal fusion 01/22/2020   Memory deficit 01/14/2020   Sacroiliac dysfunction 01/14/2020   Neck pain 11/05/2019   Body mass index (BMI) 25.0-25.9, adult 11/05/2019   Unilateral primary osteoarthritis, right hip 09/01/2019   Trochanteric bursitis, left hip 07/14/2019   S/P lumbar fusion 05/18/2019   OSA (obstructive sleep apnea) 10/28/2018   Pain managed using patient-controlled analgesia (PCA) 10/25/2018   Fall 10/24/2018   Fall from ladder 10/23/2018   DDD (degenerative disc disease), lumbosacral 09/15/2018   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   Central line complication 10/22/2016   Genetic testing 05/15/2016   Family history of breast cancer    Family history of brain cancer    Breast cancer of upper-outer quadrant of left female breast (HCC) 03/20/2016   Transaminitis 10/03/2015   Symptomatic cholelithiasis 10/03/2015   RUQ abdominal pain    Lumbosacral neuritis 11/10/2014   Lumbar radiculopathy 10/19/2014   Depression 06/24/2013   RLS (restless legs syndrome) 06/24/2013   Low back pain 05/19/2013   Past Medical History:  Diagnosis Date   Anxiety    Arthritis    knees   Breast cancer (HCC)    Breast cancer of upper-outer quadrant of left female breast (HCC) 03/20/2016   C2 cervical fracture (HCC)    10/22/18, following fall from ladder   Cervicalgia    Cholecystitis    Complication of anesthesia    BP "crashes" post op   Early cataracts, bilateral    Family history of brain cancer    Family history of breast cancer    Hot flashes    Personal history of chemotherapy    Personal history of radiation therapy    Restless leg syndrome    Sleep apnea 10/03/2018   wears CPAP    Family History  Problem Relation Age of Onset   Heart disease Mother    Pulmonary  fibrosis Mother    Hypertension Mother    Sleep apnea Mother    Cancer Father 74       astocytoma   Hypertension Brother    Lymphoma Maternal Aunt    Anesthesia problems Neg Hx     Past Surgical History:  Procedure Laterality Date   ANTERIOR CERVICAL DECOMP/DISCECTOMY FUSION N/A 01/22/2020   Procedure: ACDF - C2-C3;  Surgeon: Isadora Mar, MD;  Location: [facility] OR;  Service: Neurosurgery;  Laterality: N/A;   BACK SURGERY     x2   BREAST BIOPSY     BREAST LUMPECTOMY Left 2018   BUNIONECTOMY     CHOLECYSTECTOMY N/A 10/04/2015   Procedure: LAPAROSCOPIC CHOLECYSTECTOMY WITH INTRAOPERATIVE CHOLANGIOGRAM;  Surgeon: Aldean Hummingbird, MD;  Location: [facility] OR;  Service: General;  Laterality: N/A;   ELBOW SURGERY     right for epicondylitis 2010    ESOPHAGEAL MANOMETRY N/A 11/30/2020   Procedure: ESOPHAGEAL MANOMETRY (EM);  Surgeon: Genell Ken, MD;  Location: [facility];  Service: Gastroenterology;  Laterality: N/A;   FOOT SURGERY     1983 -tarsal tunnel release   IR GENERIC HISTORICAL  10/26/2016   IR CV LINE INJECTION 10/26/2016 [facility]   LUMBAR DISC SURGERY  04/04/2012   MASS EXCISION Left 02/06/2021   Procedure: EXCISION LEFT FOOT SOFT TISSUE BETWEEN SECOND AND THIRD AND THIRD AND FOURTH TOES;  Surgeon: Lizzie Riis, DPM;  Location: [facility];  Service: Podiatry;  Laterality: Left;   PORT-A-CATH REMOVAL N/A 11/15/2016   Procedure: REMOVAL PORT-A-CATH;  Surgeon: Enid Harry, MD;  Location: [facility];  Service: General;  Laterality: N/A;   PORTACATH PLACEMENT Right 04/02/2016   Procedure: INSERTION PORT-A-CATH WITH [place] ;  Surgeon: Enid Harry, MD;  Location: [facility];  Service: General;  Laterality: Right;   RADIOACTIVE SEED GUIDED PARTIAL MASTECTOMY/AXILLARY SENTINEL NODE BIOPSY/AXILLARY NODE DISSECTION Left 09/12/2016   Procedure: LEFT BREAST SEED GUIDED LUMPECTOMY, LEFT AXILLARY SENTINEL NODE BIOPSY, LEFT SEED GUIDED AXILLARY NODE  EXCISION( TARGETED AXILLARY DISSECTION), BLUE DYE INJECTION;  Surgeon: Enid Harry, MD;  Location: [facility];  Service: General;  Laterality: Left;   SACROILIAC JOINT FUSION Left 05/18/2020   Procedure: LEFT SACROILIAC JOINT FUSION;  Surgeon: Virl Grimes, MD;  Location: [facility] OR;  Service: Orthopedics;  Laterality: Left;   SPINAL FUSION  5/12   L5-S1   SPINAL FUSION  05/18/2019   L4-5   TARSAL TUNNEL RELEASE     TOTAL KNEE ARTHROPLASTY Right 09/03/2023   Procedure: RIGHT TOTAL KNEE ARTHROPLASTY;  Surgeon: Jasmine Mesi, MD;  Location: [facility] OR;  Service: Orthopedics;  Laterality: Right;   Social History   Occupational History   Not on file  Tobacco Use   Smoking status: Never   Smokeless tobacco: Never  Vaping Use   Vaping status: Never Used  Substance and Sexual Activity   Alcohol use: Yes    Alcohol/week: 1.0 standard drink of alcohol    Types: 1 Cans of beer per week    Comment: Rarely 1/month   Drug use: No   Sexual activity: Not Currently    Birth control/protection: Post-menopausal

## 2024-03-24 DIAGNOSIS — R5383 Other fatigue: Secondary | ICD-10-CM | POA: Diagnosis not present

## 2024-03-24 DIAGNOSIS — R059 Cough, unspecified: Secondary | ICD-10-CM | POA: Diagnosis not present

## 2024-04-09 DIAGNOSIS — M48061 Spinal stenosis, lumbar region without neurogenic claudication: Secondary | ICD-10-CM | POA: Diagnosis not present

## 2024-04-09 DIAGNOSIS — M5416 Radiculopathy, lumbar region: Secondary | ICD-10-CM | POA: Diagnosis not present

## 2024-04-09 DIAGNOSIS — R200 Anesthesia of skin: Secondary | ICD-10-CM | POA: Diagnosis not present

## 2024-04-09 DIAGNOSIS — M4726 Other spondylosis with radiculopathy, lumbar region: Secondary | ICD-10-CM | POA: Diagnosis not present

## 2024-04-09 DIAGNOSIS — M5116 Intervertebral disc disorders with radiculopathy, lumbar region: Secondary | ICD-10-CM | POA: Diagnosis not present

## 2024-04-14 DIAGNOSIS — M5416 Radiculopathy, lumbar region: Secondary | ICD-10-CM | POA: Diagnosis not present

## 2024-04-14 DIAGNOSIS — M4310 Spondylolisthesis, site unspecified: Secondary | ICD-10-CM | POA: Diagnosis not present

## 2024-04-17 ENCOUNTER — Other Ambulatory Visit: Payer: Self-pay | Admitting: Neurological Surgery

## 2024-04-22 DIAGNOSIS — J069 Acute upper respiratory infection, unspecified: Secondary | ICD-10-CM | POA: Diagnosis not present

## 2024-04-22 DIAGNOSIS — G4733 Obstructive sleep apnea (adult) (pediatric): Secondary | ICD-10-CM | POA: Diagnosis not present

## 2024-04-23 ENCOUNTER — Encounter: Payer: Self-pay | Admitting: Psychology

## 2024-04-23 ENCOUNTER — Encounter: Payer: Medicare PPO | Attending: Psychology | Admitting: Psychology

## 2024-04-23 DIAGNOSIS — F5101 Primary insomnia: Secondary | ICD-10-CM | POA: Diagnosis not present

## 2024-04-23 DIAGNOSIS — R413 Other amnesia: Secondary | ICD-10-CM | POA: Insufficient documentation

## 2024-04-23 DIAGNOSIS — G4733 Obstructive sleep apnea (adult) (pediatric): Secondary | ICD-10-CM | POA: Diagnosis not present

## 2024-04-23 DIAGNOSIS — G3184 Mild cognitive impairment, so stated: Secondary | ICD-10-CM | POA: Diagnosis not present

## 2024-04-23 NOTE — Progress Notes (Unsigned)
 Neuropsychological Evaluation   Patient:  Alexis Fox   DOB: 1956/04/03  MR Number: [id number]  Location: [facility] [address] [place], [address] [place] [address] Dept: [phone]  Start: 1 PM End: 2 PM  Provider/Observer:     Marrion Sjogren PsyD  Chief Complaint:      Chief Complaint  Patient presents with   Memory Loss   Anxiety    Reason For Service:     Alexis Fox is a 68 year old female referred for neuropsychological evaluation by her treating neurologist Neomia Banner, MD, due to ongoing memory difficulties and cognitive changes that developed after her chemotherapy in 2017.  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  Patient has been using a nasal cradle mask.  Patient has struggled with persistent sleepiness and fatigue, which did not particularly improve with CPAP use.  Patient is a cancer survivor, having gone through chemotherapy for breast cancer.  Patient has described ongoing memory difficulties and changes in cognitive capacity along with her excessive daytime sleepiness.  Patient is compliant with her CPAP but has endorsed an Epworth score of 18/24 even with this compliance.  Patient also has anxiety and depressive features and an ongoing fear of her cancer returning.   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  She retired after her breast cancer diagnosis and treatment in 2017.  Patient continues to complain of memory loss, episodes of confusion, headaches, insomnia.  Insomnia has been persistent since 1998 and continues with excessive daytime sleepiness and onset of restless leg symptoms starting in late afternoon early evening.  The patient has not previously been formally diagnosed with an anxiety  disorder but there have been concerns around PTSD/anxiety associated with her diagnosis and treatment of breast cancer and lumpectomy in 2017.  Patient has also had fusion back surgery L5-S1 in 2013.  Patient was prescribed Wellbutrin  and Celexa  starting in roughly 2017 and patient continues to take Celexa  now.   The patient reports that she has continued to have memory deficits and reports that she depends on her husband for some basic recall and memory.  She describes both short-term and long-term memory changes.  Patient reports that she began having memory difficulties in 2017 after being diagnosed and treated for breast cancer.  Patient describes more memory issues with semantic memory versus episodic memory.  Patient reports that cueing does tend to help with her recall.  Patient does not describe any cognitive issues or deficits outside of attentional/memory issues  And ongoing issues associated with geographic disorientation and visual-spatial changes.  Patient reports that these difficulties have been rather stable and not progressively worsening.   Patient reports that during her diagnosis and treatment for breast cancer that she was initially unable to care for herself and her husband managed her medications and she was dependent upon a "map" program (I assume GPS) to find her way around when outside of home.  The patient is able to manage her own meds now and manage her health care.  She is able to navigate locally with assistance.  Patient reports that her family wavers between understanding her difficulty and frustration with her reduced functioning.  Note:  Patient saw Clem Currier, NP with [facility] on 02/13/2024, which occurred after my initial visit and reports that "She feels that  her memory has remained stable. 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 She states that this has been beneficial for her memory."    Medical History:                         Past Medical History:   Diagnosis Date   Anxiety     Arthritis      knees   Breast cancer (HCC)     Breast cancer of upper-outer quadrant of left female breast (HCC) 03/20/2016   C2 cervical fracture (HCC)      10/22/18, following fall from ladder   Cervicalgia     Cholecystitis     Complication of anesthesia      BP "crashes" post op   Depression     Early cataracts, bilateral     Family history of brain cancer     Family history of breast cancer     GERD (gastroesophageal reflux disease)     Headache      " from my neck"   Hot flashes     Personal history of chemotherapy     Personal history of radiation therapy     Restless leg syndrome     Sleep apnea 10/03/2018    wears CPAP                                                               Patient Active Problem List    Diagnosis Date Noted   Unilateral primary osteoarthritis, right knee 12/28/2022   Pain in left ankle and joints of left foot 09/13/2022   Hypersomnia, persistent 08/21/2022   Excessive daytime sleepiness 08/21/2022   MCI (mild cognitive impairment) 08/21/2022   History of breast cancer 08/21/2022   OSA on CPAP 08/21/2022   Pain in left shoulder 03/01/2021   Allergic rhinitis 02/21/2021   Anxiety 02/21/2021   Contact dermatitis due to plant 02/21/2021   Dysphagia 02/21/2021   Estrogen receptor negative status (ER-) 02/21/2021   Gastroesophageal reflux disease 02/21/2021   Gastroparesis 02/21/2021   Insomnia 02/21/2021   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   Morton's neuroma of left foot 02/21/2021   Personal history of malignant neoplasm of breast 02/21/2021   Vitamin D deficiency 02/21/2021   Oth nondisplaced dens fracture, init for clos fx (HCC) 02/04/2020   S/P cervical spinal fusion 01/22/2020   Memory deficit 01/14/2020   Sacroiliac dysfunction 01/14/2020   Neck pain 11/05/2019   Body mass index (BMI) 25.0-25.9, adult 11/05/2019   Unilateral primary osteoarthritis, right hip 09/01/2019    Trochanteric bursitis, left hip 07/14/2019   S/P lumbar fusion 05/18/2019   OSA (obstructive sleep apnea) 10/28/2018   Pain managed using patient-controlled analgesia (PCA) 10/25/2018   Fall 10/24/2018   Fall from ladder 10/23/2018   DDD (degenerative disc disease), lumbosacral 09/15/2018   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   Central line complication 10/22/2016   Genetic testing 05/15/2016   Family history of breast cancer     Family history of brain cancer     Breast cancer of upper-outer quadrant of left female breast (HCC) 03/20/2016   Transaminitis 10/03/2015   Symptomatic cholelithiasis 10/03/2015   RUQ abdominal pain     Lumbosacral neuritis 11/10/2014  Lumbar radiculopathy 10/19/2014   Depression 06/24/2013   RLS (restless legs syndrome) 06/24/2013   Low back pain 05/19/2013    Additional Tests and Measures from other records:   Neuroimaging Results: Patient had a MRI with and without contrast of brain conducted on 08/23/2022 and interpreted by Hortensia Ma, MD with comparisons to CT head in 2020.  This was due to her reports of mild cognitive difficulties and memory difficulties with a history of hypersomnia and history of breast cancer with chemotherapy.  There were no acute findings with cerebellar and brainstem appearing normal and deep gray matter appearing normal.  Cerebral hemispheres appeared normal for age with just a couple punctuate T2/FLAIR hyperintense foci in the subcortical or deep white matter regions.  These were indicative of minimal chronic microvascular ischemic changes and typical for age.   Sleep: Patient has a long history of insomnia and restless leg syndrome and some REM behavioral disorder along with her obstructive sleep apnea diagnosis.  Tests Administered: Comprehensive Attention Battery (CAB) Continuous Performance Test (CPT) Wechsler Adult Intelligence Scale, 4th Edition (WAIS-IV) Wechsler Memory Scale, 4th Edition (WMS-IV); Adult  Battery  Participation Level:   Active  Participation Quality:  Appropriate      Behavioral Observation:  The patient appeared well-groomed and appropriately dressed. Her manners were polite and appropriate to the situation. The patient's attitude towards testing was positive and her effort was good. Well Groomed, Alert, and Appropriate.   Test Results:   Initially, an estimation was made as to the patient's premorbid intellectual and cognitive functioning to provide a comparison point for current objective test results.  The patient graduated from [employer] with a degree in nursing and also took masters level classes and nursing always maintaining a very good GPA.  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  It is conservatively estimated that the patient likely is functioning in the high average range premorbidly and likely had some areas of particular skills allowing for her occupational history.  Secondly, an estimation was made as to the validity of the current assessment.  The patient appeared to try her hardest throughout the assessment procedures providing good effort throughout.  Embedded validity checks notedly within the pure reaction time measures, digit span measures as well as forced choice type items all were quite appropriate and indicative of good effort.  This does appear to be a fair and valid assessment of the patient's current status.  Initially, the patient was administered the comprehensive attention battery and the CAB CPT measures.  Again, looking at embedded validity checks strongly suggest that the patient fully participated throughout without efforts to mimic any type of deficit or difficulty.  This does appear to be a fair and valid assessment.  Initially, the patient was administered the auditory/visual pure reaction time measures.  On the pure visual reaction time measure  the patient correctly responded to 49 of 50 targets with only 1 error of omission.  Average response time was 380 ms.  These are both within normative expectations.  On the pure auditory reaction time measure the patient correctly responded to 48 of 50 targets with 2 errors of omission.  Average response time was 311 ms.  This is also well within normative expectations suggesting adequate arousal levels and adequate sensory processing for auditory and visual information speeds.  The patient was then administered the discriminate reaction time measures.  On the  visual discriminate reaction time measure the patient correctly responded to 31 of 35 targets with 4 errors of omission and 1 error of commission.  This is within normative expectations but does show some degree of lapses of attention on this measure.  The patient had an average response time was 411 ms which is well within normative expectations.  On the auditory discriminate reaction time measure the patient correctly responded to 35 of 35 targets with no errors of commission and no errors of omission.  Average response time was 574 ms which is slightly better and clearly within normative expectations.  On the shift discriminate reaction time the patient correctly responded to 28 of 30 targets with 4 errors of commission and 2 errors of omission.  These occurred at the very first transition or shift episode and the patient had none throughout the rest of the measure.  Average response time was 604 ms which is within normative expectations.  The patient was then administered the auditory/visual scan reaction time measures.  The patient had perfect accuracy scores on the visual, auditory and mixed measures correctly responding to 40 of 40 targets with no errors of commission on each of these subtest.  Average response time was 587 ms for visual, 944 ms for auditory, and 789 ms for mixed.  All of these are equal to or better response times than her  normative comparison group.  The patient was then administered the auditory/visual encoding measures.  On the digit forward measure she performed right at the normative mean for her comparison group and on the digit backwards auditory measure the patient performed right at 1 standard deviation better than her normative comparison group.  The patient exceeded 1 standard deviation better than her normative comparison group for both the visual encoding forwards and visual encoding backwards subtest.  The patient clearly does well on encoding measures with no indications of difficulties actively processing information in her auditory or visual Register respectively.  The patient was then administered the Stroop interference cancellation test.  This measure initially starts out as a pure focus execute type of attentional measure assessing information processing speed.  Therefore noninterference trials where the patient performs a visual scanning and visual searching cancellation type challenge.  The patient initially had some slowed information processing speed on the first 2 series but by the series 3 and 4 the patient was doing very well correctly identifying between 13 and 14 targets within the 15 seconds allotted.  When this measure is changed into a targeted interference challenge where the patient is instructed to continue with the same process during this targeted interference the patient maintains good performance throughout and shows no degradation of performance under the targeted auditory interference challenge.  Finally, the patient was administered the visual monitor CPT measure from the comprehensive attention battery.  This is a 15-minute discriminate reaction time measure that is broken down into five 3-minute sections for analysis.  On the first 3 minutes of this CPT measure the patient correctly responded to 30 of 30 targets with no errors of omission and no errors of commission.  Her average  response time was 397 ms.  The patient continues this excellent performance with regard to accuracy scores and never has more than 1 or 2 errors of omission during any 3-minute block of time.  The patient had no multiple responses and no errors of commission during the entire 15-minute task.  Her average response time showed only minimal increase reaching a time length of  455 ms between the 6-9-minute mark and at the 12-15-minute mark her average response time was 408 ms.  This is minimal change over the entire 15 minutes of this task.  Overall, the patient did very well on sustained attentional measures and showed no indication of significant deterioration in performance as a function of time.  WAIS-IV:              Composite Score Summary          Scale Sum of Scaled Scores Composite Score Percentile Rank 95% Conf. Interval Qualitative Description  Verbal Comprehension 28 VCI 96 39 91-102 Average  Perceptual Reasoning 40 PRI 119 90 112-124 High Average  Working Memory 19 WMI 97 42 90-104 Average  Processing Speed 24 PSI 111 77 102-118 High Average  Full Scale 111 FSIQ 107 68 103-111 Average  General Ability 68 GAI 107 68 102-112 Average    In order to get an objective assessment of a wide range of cognitive domains in a highly structured, well normed battery that allows for repeated testing in the future the patient was administered the Wechsler Adult Intelligence Scale-IV.  As seen in other measures the patient appeared to produce a valid assessment on this measure.  The patient has been describing changes in her cognition over the past couple of years and therefore the current score should not be seen as indicative of her lifelong cognitive functioning but reflective of her current status.  2 Global composite scores were calculated in this measure.  The patient produced a full-scale IQ score of 107 which falls at the 68 percentile and is in the upper end of the average range relative to  normative population.  This is only slightly below predictions for premorbid cognitive functioning and would suggest that there is very little change from premorbid estimates and would be an isolated areas.  We also calculated the patient's general abilities index score which places less emphasis on attention and concentration types of measures.  The patient produced a general abilities index score of 107, which is identical to her full-scale IQ score.  This would suggest that her challenges are not simply attentional difficulties in nature.  Looking at individual composite scores, the patient performed in the average range on measures of verbal comprehension and language based skills and auditory encoding measures.  The patient performed consistent with premorbid estimates for visual-spatial and perceptual reasoning capacities as well as information processing speed and focus execute abilities.          Verbal Comprehension Subtests Summary        Subtest Raw Score Scaled Score Percentile Rank Reference Group Scaled Score SEM  Similarities 19 8 25 7  1.04  Vocabulary 49 13 84 14 0.67  Information 9 7 16 8  0.73   The patient produced a verbal comprehension index score of 96 which also the 39th percentile and is in the average range relative to a normative population.  There was some significant scatter noted in subtest performance.  The patient did very well on the vocabulary subtest indicating a retention of language based skills and word knowledge.  The patient had some relative difficulties with regard to verbal reasoning and problem-solving as well as retrieving information she would have been exposed to throughout her life and access to that information was somewhat limited.  Perceptual Reasoning Subtests Summary        Subtest Raw Score Scaled Score Percentile Rank Reference Group Scaled Score SEM  Block Design 42 12 75  9 1.08  Matrix Reasoning 21 14 91 12 0.90  Visual Puzzles 16 14 91 10 0.85    The patient produced a perceptual reasoning index score of 119 which falls at the 90th percentile and is in the high average range relative to a normative population.  The patient did well on all subtests including visual analysis and organization, her capacity to perform whole part recognition skills, fluid visual reasoning skills, attention to detail and broad visual intelligence.  This was her highest area of cognitive functioning assessed and there are clearly no visual-spatial or visual constructional deficits noted.  Working Comptroller Raw Score Scaled Score Percentile Rank Reference Group Scaled Score SEM  Digit Span 25 10 50 8 0.79  Arithmetic 13 9 37 9 0.99   The patient produced a working memory index score of 97 which falls at the 42nd percentile and is in the average range relative to a normative population.  Consistent with what was seen on the comprehensive attention battery with similar auditory encoding challenges the patient is doing well in this area with no indication of difficulties with regard to auditory encoding or her capacity to actively process information in her auditory Register.  Processing Speed Subtests Summary        Subtest Raw Score Scaled Score Percentile Rank Reference Group Scaled Score SEM  Symbol Search 30 12 75 9 1.31  Coding 63 12 75 8 0.99   The patient produced a processing speed index score of 111 which falls at the 77 percentile and is in the high average range relative to a normative population.  The patient performed consistently on both subtest within this composite score and consistent with her performance as noted on the comprehensive attention battery on other types of focus execute/information processing speed measures.  There does not appear to be any difference between current performance levels and estimates of premorbid functioning levels with regard to visual scanning, visual searching or overall speed of mental  operations.    WMS-IV:          Index Score Summary        Index Sum of Scaled Scores Index Score Percentile Rank 95% Confidence Interval Qualitative Descriptor  Auditory Memory (AMI) 24 77 6 72-84 Borderline  Visual Memory (VMI) 26 80 9 75-86 Low Average  Visual Working Memory (VWMI) 21 103 58 96-110 Average  Immediate Memory (IMI) 27 78 7 73-85 Borderline  Delayed Memory (DMI) 23 70 2 65-79 Borderline    The patient has described difficulties with attention and memory and in order to objectively assess these components the patient was also administered the Wechsler Memory Scale-IV to provide a structured thorough estimate of a number of attention and learning domains.  On the comprehensive attention battery and the Wechsler Adult Intelligence Scale the patient did well on both auditory encoding measures and her capacity to process information in her auditory Register.  A very similar level of performance was also noted with regard to visual encoding capacity as she produced a visual working memory of 103 and fell at the [employer] on the [employer].  The patient's performance on both auditory and visual encoding measures would suggest that she is able to to adequately attend to information and process information or auditory or visual Register to allow for new information to be stored and organized.  Breaking memory functions down between auditory versus visual memory the patient produced an auditory  memory index score of 77 which falls at the 6th percentile and in the borderline range relative to a normative population.  A similar level of performance was noted with regard to visual working memory where she produced an index score of 80 and fell at the 9th percentile and at the lower end of the average range.  Both of these are below premorbid estimate levels as well as her performance on other cognitive domains outside of learning and memory.  These performances are also  significantly below would be predicted by her well retained auditory and visual encoding capacities.  Breaking memory functions down between immediate versus delayed the patient produced an immediate memory index score of 78 which fell at the 7th percentile and a delayed memory index score of 70 which falls at the 2nd percentile.  The patient is retaining significantly less information and would be predicted by both premorbid estimates as well as global cognitive functioning.  However, the limited information that is initially stored and organized does remain fairly stable without significant loss over time.  1 particular item of note is that the patient performed very well on the visual reproduction 1 and 2 subtest with significant difficulties on all other memory and learning subtest.  This is an sharp contrast with each other and would suggest some significant variability within subtest on the Wechsler memory measures.  Also, when looking at cued recall/recognition types of challenges the patient does show significant improvement under cued recall.  This would suggest that more information is being stored and organized and she is able to freely retrieve.  The contrast between her production on some memory challenges versus others is clear.         Primary Subtest Scaled Score Summary       Subtest Domain Raw Score Scaled Score Percentile Rank  Logical Memory I AM 15 5 5   Logical Memory II AM 7 4 2   Verbal Paired Associates I AM 16 7 16   Verbal Paired Associates II AM 6 8 25   Designs I VM 37 3 1  Designs II VM 0 1 0.1  Visual Reproduction I VM 36 12 75  Visual Reproduction II VM 19 10 50  Spatial Addition VWM 15 14 91  Symbol Span VWM 12 7 16           Auditory Memory Process Score Summary      Process Score Raw Score Scaled Score Percentile Rank Cumulative Percentage (Base Rate)  LM II Recognition 22 - - 17-25%  VPA II Recognition 38 - - 51-75%         Visual Memory Process Score Summary       Process Score Raw Score Scaled Score Percentile Rank Cumulative Percentage (Base Rate)  DE I Content 18 2 0.4 -  DE I Spatial 9 5 5  -  DE II Content 0 1 0.1 -  DE II Spatial 0 1 0.1 -  DE II Recognition 3 - - <=2%  VR II Recognition 6 - - >75%     Summary of Results:   The results of the current objective neuropsychological assessment do suggest that global cognitive functioning outside of specific memory components is well retained and there is no indication of any changes in executive functioning including visual and verbal reasoning and problem-solving, vocabulary/word knowledge, visual-spatial/visual reasoning and visual constructional capacity, or changes in information processing speed components.  The patient also did well on all auditory and visual attentional measures assessed.  The patient did  well on focus execute abilities and information processing speed, sustained attention, auditory and visual encoding and capacity to actively process information in her auditory and visual Register, freedom from distractibility, and ability to shift attention to changing environmental targets.  The patient's only objective findings had to do with auditory and visual memory and learning.  However, there was some considerable variability in individual subtests within her memory task.  The patient did particularly well on visual-spatial/visual reproduction types of memory challenges and also showed improvements under recognition/cued recall challenges of both visual and auditory information.  Impression/Diagnosis:   The results of the current neuropsychological evaluation do show consistencies between the patient's reports of difficulties and objective neuropsychological assessment.  While the patient reports that she is using GPS or some other mapping type of reminders the patient showed no indication of changes in visual-spatial or visual constructional capacities.  The patient shows very good  information processing speed and focus execute abilities and actually shows very good attentional components as well.  The patient's only clear identified weak area was in her capacity to effectively store and organize and retrieve information with significant improvement under recognition types of challenges.  The patient has had a number of stressors and I suspect that the patient has developed significant anxiety and fears around reoccurrence of her cancer and is likely become hypervigilant of her physical and cognitive status which can have a negative impact on memory and learning.  The patient has ongoing significant sleep disturbance and while she is compliant with her CPAP device she continues to endorse difficulties with excessive daytime sleepiness.  Restless leg syndrome is also noted.  The patient is using a nasal cradle type mask and it may be worth a trial of a full mask for her CPAP use.  As far as diagnostic considerations, the patient does not have patterns of cognitive strengths and weaknesses consistent with a degenerative major neurocognitive disorder such as Alzheimer's or significant cerebrovascular markers or test performance consistent with any significant small vessel disease or previous strokes.  The patient has specific memory difficulties with no other cognitive domains appearing to be impacted.  However, even the memory components are more about difficulties retrieving information then a true inability to attend, store and organize or retrieve information and cued/recognition type challenges.  I suspect that the combination of ongoing difficulties with sleep, increased anxiety and hypervigilance of medical and cognitive state, chronic fatigue and daytime sleepiness are the primary factors.  The patient would meet a criterion for mild memory loss but not the criterion for major neurocognitive disorder.  We have a good baseline if the patient continues to show any worsening of symptoms to  compare against in the future.  I will sit down with the patient and go over the results of the current neuropsychological evaluation.  It appears that the patient has returned to at least some part-time work and has noted to her neurologist that this is improved/positively impacted her overall status.  Continued vigilance over sleep patterns is warranted.  We will set the patient up for repeat testing in approximately 1 year but the patient continues to note stability or even improvement there may not be a need to repeat this testing down the road.  Diagnosis:    MCI (mild cognitive impairment)  Memory deficit  OSA on CPAP  Primary insomnia   _____________________ Chapman Commodore, Psy.D. Clinical Neuropsychologist

## 2024-04-28 ENCOUNTER — Encounter: Payer: Self-pay | Admitting: Psychology

## 2024-04-28 ENCOUNTER — Encounter: Payer: Medicare PPO | Admitting: Psychology

## 2024-04-28 DIAGNOSIS — R413 Other amnesia: Secondary | ICD-10-CM

## 2024-04-28 DIAGNOSIS — G4733 Obstructive sleep apnea (adult) (pediatric): Secondary | ICD-10-CM

## 2024-04-28 DIAGNOSIS — G3184 Mild cognitive impairment, so stated: Secondary | ICD-10-CM | POA: Diagnosis not present

## 2024-04-28 DIAGNOSIS — F5101 Primary insomnia: Secondary | ICD-10-CM | POA: Diagnosis not present

## 2024-04-28 NOTE — Progress Notes (Signed)
 Neuropsychological Evaluation   Patient:  Alexis Fox   DOB: Jul 28, 1956  MR Number: [id number]  Location: [facility] [address] [place], [address] [place] [address] Dept: [phone]  Start: 10 AM End: 11 AM  Provider/Observer:     Marrion Sjogren PsyD  Chief Complaint:      Chief Complaint  Patient presents with   Memory Loss   Anxiety   04/28/2024 10 AM-11 AM: Today I provided feedback regarding the results of the recent neuropsychological evaluation with the patient and her husband present.  The patient and husband both report that her symptoms have generally been stable over the past 6 to 12 months with continued mild memory difficulties but generally stable.  The patient is primary memory difficulties appear to be primarily retrieval in nature versus an inability to effectively store new information.  The patient's cognitive strengths and weaknesses are not consistent with a degenerative major neurocognitive disorder per se and PTSD/anxiety could be playing a significant role in a multifactorial causative description.  The patient has returned to work noting that having some cognitive stimulation is good for her.  I have encouraged her to continue to do this work as long as she is maintaining efficient performance and it is positive for her emotionally.  I have included a copy of the reason for service and the summary of the formal evaluation below for convenience and the complete neuropsychological evaluation can be found in the patient's EMR dated 04/23/2024.   Reason For Service:     Alexis Fox is a 68 year old female referred for neuropsychological evaluation by her treating neurologist Neomia Banner, MD, due to ongoing memory difficulties and cognitive changes that developed after her chemotherapy in 2017.  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  Patient has been using a nasal cradle mask.  Patient has struggled with persistent sleepiness and fatigue, which did not particularly improve with CPAP use.  Patient is a cancer survivor, having gone through chemotherapy for breast cancer.  Patient has described ongoing memory difficulties and changes in cognitive capacity along with her excessive daytime sleepiness.  Patient is compliant with her CPAP but has endorsed an Epworth score of 18/24 even with this compliance.  Patient also has anxiety and depressive features and an ongoing fear of her cancer returning.   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  She retired after her breast cancer diagnosis and treatment in 2017.  Patient continues to complain of memory loss, episodes of confusion, headaches, insomnia.  Insomnia has been persistent since 1998 and continues with excessive daytime sleepiness and onset of restless leg symptoms starting in late afternoon early evening.  The patient has not previously been formally diagnosed with an anxiety disorder but there have been concerns around PTSD/anxiety associated with her diagnosis and treatment of breast cancer and lumpectomy in 2017.  Patient has also had fusion back surgery L5-S1 in 2013.  Patient was prescribed Wellbutrin  and Celexa  starting in roughly 2017 and patient continues to take Celexa  now.   The patient reports that she has continued to have memory deficits and reports that she depends on her husband for some basic recall and memory.  She describes both short-term and long-term memory changes.  Patient reports that she began having memory difficulties in 2017 after  being diagnosed and treated for breast cancer.  Patient describes more memory issues with semantic memory versus episodic memory.  Patient reports that cueing does tend to help with her recall.  Patient  does not describe any cognitive issues or deficits outside of attentional/memory issues  And ongoing issues associated with geographic disorientation and visual-spatial changes.  Patient reports that these difficulties have been rather stable and not progressively worsening.   Patient reports that during her diagnosis and treatment for breast cancer that she was initially unable to care for herself and her husband managed her medications and she was dependent upon a "map" program (I assume GPS) to find her way around when outside of home.  The patient is able to manage her own meds now and manage her health care.  She is able to navigate locally with assistance.  Patient reports that her family wavers between understanding her difficulty and frustration with her reduced functioning.  Note:  Patient saw Clem Currier, NP with [facility] on 02/13/2024, which occurred after my initial visit and reports that "She feels that her memory has remained stable. 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 She states that this has been beneficial for her memory."     Impression/Diagnosis:   The results of the current neuropsychological evaluation do show consistencies between the patient's reports of difficulties and objective neuropsychological assessment.  While the patient reports that she is using GPS or some other mapping type of reminders the patient showed no indication of changes in visual-spatial or visual constructional capacities.  The patient shows very good information processing speed and focus execute abilities and actually shows very good attentional components as well.  The patient's only clear identified weak area was in her capacity to effectively store and organize and retrieve information with significant improvement under recognition types of challenges.  The patient has had a number of stressors and I suspect that the patient has developed significant anxiety and fears around reoccurrence of her  cancer and is likely become hypervigilant of her physical and cognitive status which can have a negative impact on memory and learning.  The patient has ongoing significant sleep disturbance and while she is compliant with her CPAP device she continues to endorse difficulties with excessive daytime sleepiness.  Restless leg syndrome is also noted.  The patient is using a nasal cradle type mask and it may be worth a trial of a full mask for her CPAP use.  As far as diagnostic considerations, the patient does not have patterns of cognitive strengths and weaknesses consistent with a degenerative major neurocognitive disorder such as Alzheimer's or significant cerebrovascular markers or test performance consistent with any significant small vessel disease or previous strokes.  The patient has specific memory difficulties with no other cognitive domains appearing to be impacted.  However, even the memory components are more about difficulties retrieving information then a true inability to attend, store and organize or retrieve information and cued/recognition type challenges.  I suspect that the combination of ongoing difficulties with sleep, increased anxiety and hypervigilance of medical and cognitive state, chronic fatigue and daytime sleepiness are the primary factors.  The patient would meet a criterion for mild memory loss but not the criterion for major neurocognitive disorder.  We have a good baseline if the patient continues to show any worsening of symptoms to compare against in the future.  I will sit down with the patient and go over the results of the current neuropsychological evaluation.  It appears that the patient has returned to at least some part-time work and has noted to her neurologist that this is improved/positively impacted her overall status.  Continued vigilance over sleep patterns is warranted.  We will set the patient up for repeat testing in approximately 1 year but the patient  continues to note stability or even improvement there may not be a need to repeat this testing down the road.  Diagnosis:    MCI (mild cognitive impairment)  Memory deficit  OSA on CPAP  Primary insomnia   _____________________ Chapman Commodore, Psy.D. Clinical Neuropsychologist

## 2024-05-04 DIAGNOSIS — D120 Benign neoplasm of cecum: Secondary | ICD-10-CM | POA: Diagnosis not present

## 2024-05-04 DIAGNOSIS — Z860101 Personal history of adenomatous and serrated colon polyps: Secondary | ICD-10-CM | POA: Diagnosis not present

## 2024-05-04 DIAGNOSIS — Z09 Encounter for follow-up examination after completed treatment for conditions other than malignant neoplasm: Secondary | ICD-10-CM | POA: Diagnosis not present

## 2024-05-06 DIAGNOSIS — D120 Benign neoplasm of cecum: Secondary | ICD-10-CM | POA: Diagnosis not present

## 2024-05-06 NOTE — Progress Notes (Signed)
 Surgical Instructions   Your procedure is scheduled on Friday, June 13th, 2025. Report to [facility] Main Entrance "A" at 11:45 A.M., then check in with the Admitting office. Any questions or running late day of surgery: call [phone]  Questions prior to your surgery date: call [phone], Monday-Friday, 8am-4pm. If you experience any cold or flu symptoms such as cough, fever, chills, shortness of breath, etc. between now and your scheduled surgery, please notify us  at the above number.     Remember:  Do not eat after midnight the night before your surgery  You may drink clear liquids until 10:45 the morning of your surgery.   Clear liquids allowed are: Water , Non-Citrus Juices (without pulp), Carbonated Beverages, Clear Tea (no milk, honey, etc.), Black Coffee Only (NO MILK, CREAM OR POWDERED CREAMER of any kind), and Gatorade.    Take these medicines the morning of surgery with A SIP OF WATER : Bupropion  (Wellbutrin )    May take these medicines IF NEEDED: Acetaminophen  (Tylenol ) Pramipexole  (Mirapex )    One week prior to surgery, STOP taking any Aspirin  (unless otherwise instructed by your surgeon) Aleve, Naproxen, Ibuprofen , Motrin , Advil , Goody's, BC's, all herbal medications, fish oil, and non-prescription vitamins.                     Do NOT Smoke (Tobacco/Vaping) for 24 hours prior to your procedure.  If you use a CPAP at night, you may bring your mask/headgear for your overnight stay.   You will be asked to remove any contacts, glasses, piercing's, hearing aid's, dentures/partials prior to surgery. Please bring cases for these items if needed.    Patients discharged the day of surgery will not be allowed to drive home, and someone needs to stay with them for 24 hours.  SURGICAL WAITING ROOM VISITATION Patients may have no more than 2 support people in the waiting area - these visitors may rotate.   Pre-op nurse will coordinate an appropriate time for 1 ADULT  support person, who may not rotate, to accompany patient in pre-op.  Children under the age of 39 must have an adult with them who is not the patient and must remain in the main waiting area with an adult.  If the patient needs to stay at the hospital during part of their recovery, the visitor guidelines for inpatient rooms apply.  Please refer to the [facility] website for the visitor guidelines for any additional information.   If you received a COVID test during your pre-op visit  it is requested that you wear a mask when out in public, stay away from anyone that may not be feeling well and notify your surgeon if you develop symptoms. If you have been in contact with anyone that has tested positive in the last 10 days please notify you surgeon.      Pre-operative 5 CHG Bathing Instructions   You can play a key role in reducing the risk of infection after surgery. Your skin needs to be as free of germs as possible. You can reduce the number of germs on your skin by washing with CHG (chlorhexidine  gluconate) soap before surgery. CHG is an antiseptic soap that kills germs and continues to kill germs even after washing.   DO NOT use if you have an allergy to chlorhexidine /CHG or antibacterial soaps. If your skin becomes reddened or irritated, stop using the CHG and notify one of our RNs at [phone].   Please shower with the CHG soap starting 4  days before surgery using the following schedule:     Please keep in mind the following:  DO NOT shave, including legs and underarms, starting the day of your first shower.   You may shave your face at any point before/day of surgery.  Place clean sheets on your bed the day you start using CHG soap. Use a clean washcloth (not used since being washed) for each shower. DO NOT sleep with pets once you start using the CHG.   CHG Shower Instructions:  Wash your face and private area with normal soap. If you choose to wash your hair, wash first  with your normal shampoo.  After you use shampoo/soap, rinse your hair and body thoroughly to remove shampoo/soap residue.  Turn the water  OFF and apply about 3 tablespoons (45 ml) of CHG soap to a CLEAN washcloth.  Apply CHG soap ONLY FROM YOUR NECK DOWN TO YOUR TOES (washing for 3-5 minutes)  DO NOT use CHG soap on face, private areas, open wounds, or sores.  Pay special attention to the area where your surgery is being performed.  If you are having back surgery, having someone wash your back for you may be helpful. Wait 2 minutes after CHG soap is applied, then you may rinse off the CHG soap.  Pat dry with a clean towel  Put on clean clothes/pajamas   If you choose to wear lotion, please use ONLY the CHG-compatible lotions that are listed below.  Additional instructions for the day of surgery: DO NOT APPLY any lotions, deodorants, cologne, or perfumes.   Do not bring valuables to the hospital. [facility] is not responsible for any belongings/valuables. Do not wear nail polish, gel polish, artificial nails, or any other type of covering on natural nails (fingers and toes) Do not wear jewelry or makeup Put on clean/comfortable clothes.  Please brush your teeth.  Ask your nurse before applying any prescription medications to the skin.     CHG Compatible Lotions   Aveeno Moisturizing lotion  Cetaphil Moisturizing Cream  Cetaphil Moisturizing Lotion  Clairol Herbal Essence Moisturizing Lotion, Dry Skin  Clairol Herbal Essence Moisturizing Lotion, Extra Dry Skin  Clairol Herbal Essence Moisturizing Lotion, Normal Skin  Curel Age Defying Therapeutic Moisturizing Lotion with Alpha Hydroxy  Curel Extreme Care Body Lotion  Curel Soothing Hands Moisturizing Hand Lotion  Curel Therapeutic Moisturizing Cream, Fragrance-Free  Curel Therapeutic Moisturizing Lotion, Fragrance-Free  Curel Therapeutic Moisturizing Lotion, Original Formula  Eucerin Daily Replenishing Lotion  Eucerin Dry Skin  Therapy Plus Alpha Hydroxy Crme  Eucerin Dry Skin Therapy Plus Alpha Hydroxy Lotion  Eucerin Original Crme  Eucerin Original Lotion  Eucerin Plus Crme Eucerin Plus Lotion  Eucerin TriLipid Replenishing Lotion  Keri Anti-Bacterial Hand Lotion  Keri Deep Conditioning Original Lotion Dry Skin Formula Softly Scented  Keri Deep Conditioning Original Lotion, Fragrance Free Sensitive Skin Formula  Keri Lotion Fast Absorbing Fragrance Free Sensitive Skin Formula  Keri Lotion Fast Absorbing Softly Scented Dry Skin Formula  Keri Original Lotion  Keri Skin Renewal Lotion Keri Silky Smooth Lotion  Keri Silky Smooth Sensitive Skin Lotion  Nivea Body Creamy Conditioning Oil  Nivea Body Extra Enriched Lotion  Nivea Body Original Lotion  Nivea Body Sheer Moisturizing Lotion Nivea Crme  Nivea Skin Firming Lotion  NutraDerm 30 Skin Lotion  NutraDerm Skin Lotion  NutraDerm Therapeutic Skin Cream  NutraDerm Therapeutic Skin Lotion  ProShield Protective Hand Cream  Provon moisturizing lotion  Please read over the following fact sheets that you were  given.

## 2024-05-07 ENCOUNTER — Other Ambulatory Visit: Payer: Self-pay

## 2024-05-07 ENCOUNTER — Encounter (HOSPITAL_COMMUNITY): Payer: Self-pay

## 2024-05-07 ENCOUNTER — Encounter (HOSPITAL_COMMUNITY)
Admission: RE | Admit: 2024-05-07 | Discharge: 2024-05-07 | Disposition: A | Discharge status: Home / Self Care | Source: Ambulatory Visit | Attending: Neurological Surgery | Admitting: Neurological Surgery

## 2024-05-07 VITALS — BP 118/60 | HR 72 | Temp 98.5°F | Resp 18 | Ht 64.0 in | Wt 215.9 lb

## 2024-05-07 DIAGNOSIS — Z01818 Encounter for other preprocedural examination: Secondary | ICD-10-CM | POA: Diagnosis present

## 2024-05-07 DIAGNOSIS — M5416 Radiculopathy, lumbar region: Secondary | ICD-10-CM | POA: Insufficient documentation

## 2024-05-07 DIAGNOSIS — Z981 Arthrodesis status: Secondary | ICD-10-CM | POA: Diagnosis not present

## 2024-05-07 DIAGNOSIS — Z853 Personal history of malignant neoplasm of breast: Secondary | ICD-10-CM | POA: Diagnosis not present

## 2024-05-07 DIAGNOSIS — Z96651 Presence of right artificial knee joint: Secondary | ICD-10-CM | POA: Insufficient documentation

## 2024-05-07 DIAGNOSIS — R109 Unspecified abdominal pain: Secondary | ICD-10-CM | POA: Diagnosis not present

## 2024-05-07 DIAGNOSIS — G4733 Obstructive sleep apnea (adult) (pediatric): Secondary | ICD-10-CM | POA: Insufficient documentation

## 2024-05-07 DIAGNOSIS — Z01812 Encounter for preprocedural laboratory examination: Secondary | ICD-10-CM | POA: Diagnosis not present

## 2024-05-07 DIAGNOSIS — R1011 Right upper quadrant pain: Secondary | ICD-10-CM | POA: Diagnosis not present

## 2024-05-07 DIAGNOSIS — Z9889 Other specified postprocedural states: Secondary | ICD-10-CM | POA: Insufficient documentation

## 2024-05-07 LAB — CBC
HCT: 38.8 % (ref 36.0–46.0)
Hemoglobin: 12.4 g/dL (ref 12.0–15.0)
MCH: 30 pg (ref 26.0–34.0)
MCHC: 32 g/dL (ref 30.0–36.0)
MCV: 93.7 fL (ref 80.0–100.0)
Platelets: 232 10*3/uL (ref 150–400)
RBC: 4.14 MIL/uL (ref 3.87–5.11)
RDW: 14 % (ref 11.5–15.5)
WBC: 7.6 10*3/uL (ref 4.0–10.5)
nRBC: 0 % (ref 0.0–0.2)

## 2024-05-07 LAB — TYPE AND SCREEN
ABO/RH(D): A POS
Antibody Screen: NEGATIVE

## 2024-05-07 LAB — SURGICAL PCR SCREEN
MRSA, PCR: NEGATIVE
Staphylococcus aureus: NEGATIVE

## 2024-05-07 NOTE — Progress Notes (Signed)
 PCP - Tressia Fry  Cardiologist -   PPM/ICD - denies Device Orders -  Rep Notified -   Chest x-ray -  EKG - 08-21-23 Stress Test -  ECHO - 05-14-16 Cardiac Cath -   Sleep Study -  CPAP - uses nightly  DM -denies  Blood Thinner Instructions: denies Aspirin  Instructions:n/a  ERAS Protcol - clear liquids until 10:45 PRE-SURGERY Ensure or G2-   COVID TEST-    Anesthesia review: yes, hx of bp becoming low after surgery  Patient denies shortness of breath, fever, cough and chest pain at PAT appointment   All instructions explained to the patient, with a verbal understanding of the material. Patient agrees to go over the instructions while at home for a better understanding. Patient also instructed to self quarantine after being tested for COVID-19. The opportunity to ask questions was provided.

## 2024-05-08 NOTE — Progress Notes (Signed)
 Anesthesia Chart Review:  Case: [id number] Date/Time: 05/15/24 1333   Procedure: POSTERIOR LUMBAR FUSION 1 LEVEL (Back) - PLIF - L1-L2 with extension of instrumentation - Posterior Lateral and Interbody fusion   Anesthesia type: General   Diagnosis: Radiculopathy, lumbar region [M54.16]   Pre-op diagnosis: Radiculopathy, lumbar region   [facility] ROOM [facility] / [facility]   Surgeons: Joaquin Mulberry, MD       DISCUSSION: Patient is a 68 year old female scheduled for the above procedure.  History includes never smoker, left breast cancer (s/p left breast lumpectomy 09/12/16, s/p chemoradiation), OSA (uses CPAP), fall from ladder with C2 fracture (10/22/18), spinal surgery (L5-S1 TLIF 04/04/12; L4-5 PLIF 05/18/19; C2-3 ACDF 01/22/20; left SI joint fusion 05/18/20; L2-4 PLIF, removal segmental fixation L4-5 08/24/22), osteoarthritis (right TKA 09/03/23).   She reported issues with post-operative hypotension. No perioperative complications noted post-operative anesthesia records from surgeries from 05/19/19-09/03/23 at [facility].   Anesthesia team to evaluate on the day of surgery.  VS: BP 118/60   Pulse 72   Temp 36.9 C   Resp 18   Ht 5\' 4"  (1.626 m)   Wt 97.9 kg   SpO2 100%   BMI 37.06 kg/m   PROVIDERS: Tena Feeling, MD is PCP  Cameron Cea, MD is HEM-ONC. As needed follow-up at 01/30/21 visit.    LABS: Labs reviewed: Acceptable for surgery. (all labs ordered are listed, but only abnormal results are displayed)  Labs Reviewed  SURGICAL PCR SCREEN  CBC  TYPE AND SCREEN     IMAGES: CT L-spine 08/29/23: IMPRESSION: 1. Interval postoperative changes of extension of interbody and posterior spinal fusion cranially to L2. No evidence of hardware complication. 2. Increased retrolisthesis of L1 on L2 with superimposed disc bulge and mild bilateral facet arthropathy results in mild spinal canal stenosis.   EKG: 08/21/2023: Normal sinus rhythm Incomplete right bundle branch  block Normal EKG. When compared with ECG of 22-Jan-2020 06:00, No significant change since Confirmed by Knox Perl ([id number]) on 08/22/2023 4:10:07 PM   CV: TTE 05/14/16: - Left ventricle: The cavity size was normal. Wall thickness was    normal. Systolic function was normal. The estimated ejection    fraction was in the range of 60% to 65%. Wall motion was normal;    there were no regional wall motion abnormalities. Features are    consistent with a pseudonormal left ventricular filling pattern,    with concomitant abnormal relaxation and increased filling    pressure (grade 2 diastolic dysfunction).  - Pulmonary arteries: PA peak pressure: 33 mm Hg (S).  - Impressions: Lateral s&' = 10.5 cm/sec. GLS -23.4%      Past Medical History:  Diagnosis Date   Anxiety    Arthritis    knees   Breast cancer (HCC)    Breast cancer of upper-outer quadrant of left female breast (HCC) 03/20/2016   C2 cervical fracture (HCC)    10/22/18, following fall from ladder   Cervicalgia    Cholecystitis    Complication of anesthesia    BP "crashes" post op   Early cataracts, bilateral    Family history of brain cancer    Family history of breast cancer    Hot flashes    Personal history of chemotherapy    Personal history of radiation therapy    Restless leg syndrome    Sleep apnea 10/03/2018   wears CPAP    Past Surgical History:  Procedure Laterality Date   ANTERIOR CERVICAL DECOMP/DISCECTOMY  FUSION N/A 01/22/2020   Procedure: ACDF - C2-C3;  Surgeon: Isadora Mar, MD;  Location: [facility] OR;  Service: Neurosurgery;  Laterality: N/A;   BACK SURGERY     x2   BREAST BIOPSY     BREAST LUMPECTOMY Left 2018   BUNIONECTOMY     CHOLECYSTECTOMY N/A 10/04/2015   Procedure: LAPAROSCOPIC CHOLECYSTECTOMY WITH INTRAOPERATIVE CHOLANGIOGRAM;  Surgeon: Aldean Hummingbird, MD;  Location: [facility] OR;  Service: General;  Laterality: N/A;   ELBOW SURGERY     right for epicondylitis 2010    ESOPHAGEAL MANOMETRY N/A 11/30/2020    Procedure: ESOPHAGEAL MANOMETRY (EM);  Surgeon: Genell Ken, MD;  Location: [facility];  Service: Gastroenterology;  Laterality: N/A;   FOOT SURGERY     1983 -tarsal tunnel release   IR GENERIC HISTORICAL  10/26/2016   IR CV LINE INJECTION 10/26/2016 [facility]-INTERV RAD   LUMBAR DISC SURGERY  04/04/2012   MASS EXCISION Left 02/06/2021   Procedure: EXCISION LEFT FOOT SOFT TISSUE BETWEEN SECOND AND THIRD AND THIRD AND FOURTH TOES;  Surgeon: Lizzie Riis, DPM;  Location: [facility];  Service: Podiatry;  Laterality: Left;   PORT-A-CATH REMOVAL N/A 11/15/2016   Procedure: REMOVAL PORT-A-CATH;  Surgeon: Enid Harry, MD;  Location: [facility];  Service: General;  Laterality: N/A;   PORTACATH PLACEMENT Right 04/02/2016   Procedure: INSERTION PORT-A-CATH WITH US ;  Surgeon: Enid Harry, MD;  Location: [facility];  Service: General;  Laterality: Right;   RADIOACTIVE SEED GUIDED PARTIAL MASTECTOMY/AXILLARY SENTINEL NODE BIOPSY/AXILLARY NODE DISSECTION Left 09/12/2016   Procedure: LEFT BREAST SEED GUIDED LUMPECTOMY, LEFT AXILLARY SENTINEL NODE BIOPSY, LEFT SEED GUIDED AXILLARY NODE EXCISION( TARGETED AXILLARY DISSECTION), BLUE DYE INJECTION;  Surgeon: Enid Harry, MD;  Location: [facility];  Service: General;  Laterality: Left;   SACROILIAC JOINT FUSION Left 05/18/2020   Procedure: LEFT SACROILIAC JOINT FUSION;  Surgeon: Virl Grimes, MD;  Location: [facility] OR;  Service: Orthopedics;  Laterality: Left;   SPINAL FUSION  5/12   L5-S1   SPINAL FUSION  05/18/2019   L4-5   TARSAL TUNNEL RELEASE     TOTAL KNEE ARTHROPLASTY Right 09/03/2023   Procedure: RIGHT TOTAL KNEE ARTHROPLASTY;  Surgeon: Jasmine Mesi, MD;  Location: [facility] OR;  Service: Orthopedics;  Laterality: Right;    MEDICATIONS:  acetaminophen  (TYLENOL ) 500 MG tablet   buPROPion  (WELLBUTRIN  XL) 150 MG 24 hr tablet   cetirizine (ZYRTEC) 10 MG tablet   Cholecalciferol  (VITAMIN D) 50 MCG (2000 UT) tablet   citalopram  (CELEXA ) 40 MG tablet   clobetasol  ointment (TEMOVATE ) 0.05 %   clonazePAM  (KLONOPIN ) 1 MG tablet   docusate sodium  (COLACE) 100 MG capsule   fluticasone  (FLONASE ) 50 MCG/ACT nasal spray   gabapentin  (NEURONTIN ) 300 MG capsule   ipratropium (ATROVENT) 0.06 % nasal spray   Multiple Vitamins-Minerals (MULTIVITAMIN WITH MINERALS) tablet   NON FORMULARY   pramipexole  (MIRAPEX ) 0.5 MG tablet   Pramipexole  Dihydrochloride (MIRAPEX  ER) 0.75 MG TB24   No current facility-administered medications for this encounter.    sodium chloride  flush (NS) 0.9 % injection 10 mL    Ella Gun, PA-C Surgical Short Stay/Anesthesiology [facility] Phone [phone] [facility] Phone [phone] 05/08/2024 6:51 PM

## 2024-05-08 NOTE — Anesthesia Preprocedure Evaluation (Addendum)
 Anesthesia Evaluation  Patient identified by MRN, date of birth, ID band Patient awake    Reviewed: Allergy & Precautions, NPO status , Patient's Chart, lab work & pertinent test results  History of Anesthesia Complications Negative for: history of anesthetic complications  Airway Mallampati: II  TM Distance: <3 FB Neck ROM: Full    Dental  (+) Teeth Intact, Dental Advisory Given   Pulmonary sleep apnea    breath sounds clear to auscultation       Cardiovascular negative cardio ROS  Rhythm:Regular Rate:Normal     Neuro/Psych  PSYCHIATRIC DISORDERS Anxiety Depression     Neuromuscular disease (RLS)    GI/Hepatic Neg liver ROS,GERD  ,,  Endo/Other   Obesity   Renal/GU negative Renal ROS     Musculoskeletal  (+) Arthritis ,    Abdominal   Peds  Hematology negative hematology ROS (+)   Anesthesia Other Findings Memory deficit   Reproductive/Obstetrics  Breast cancer                              Anesthesia Physical Anesthesia Plan  ASA: 2  Anesthesia Plan: General   Post-op Pain Management: Tylenol  PO (pre-op)*   Induction: Intravenous  PONV Risk Score and Plan: 3 and Treatment may vary due to age or medical condition, Ondansetron  and Propofol  infusion  Airway Management Planned: Oral ETT and Video Laryngoscope Planned  Additional Equipment: None  Intra-op Plan:   Post-operative Plan: Extubation in OR  Informed Consent: I have reviewed the patients History and Physical, chart, labs and discussed the procedure including the risks, benefits and alternatives for the proposed anesthesia with the patient or authorized representative who has indicated his/her understanding and acceptance.     Dental advisory given  Plan Discussed with: CRNA and Anesthesiologist  Anesthesia Plan Comments: (PAT note written 05/08/2024 by Allison Zelenak, PA-C.  )       Anesthesia Quick  Evaluation

## 2024-05-11 ENCOUNTER — Other Ambulatory Visit (HOSPITAL_COMMUNITY): Payer: Self-pay | Admitting: Internal Medicine

## 2024-05-11 ENCOUNTER — Ambulatory Visit (HOSPITAL_BASED_OUTPATIENT_CLINIC_OR_DEPARTMENT_OTHER)
Admission: RE | Admit: 2024-05-11 | Discharge: 2024-05-11 | Disposition: A | Discharge status: Home / Self Care | Source: Ambulatory Visit | Attending: Internal Medicine | Admitting: Internal Medicine

## 2024-05-11 DIAGNOSIS — R109 Unspecified abdominal pain: Secondary | ICD-10-CM

## 2024-05-11 DIAGNOSIS — N281 Cyst of kidney, acquired: Secondary | ICD-10-CM | POA: Diagnosis not present

## 2024-05-11 DIAGNOSIS — Z9049 Acquired absence of other specified parts of digestive tract: Secondary | ICD-10-CM | POA: Diagnosis not present

## 2024-05-11 MED ORDER — IOHEXOL 300 MG/ML  SOLN
100.0000 mL | Freq: Once | INTRAMUSCULAR | Status: AC | PRN
  Administered 2024-05-11: 100 mL via INTRAVENOUS

## 2024-05-15 ENCOUNTER — Encounter (HOSPITAL_COMMUNITY)
Admission: RE | Disposition: A | Discharge status: Home / Self Care | Payer: Self-pay | Source: Home / Self Care | Attending: Neurological Surgery

## 2024-05-15 ENCOUNTER — Ambulatory Visit (HOSPITAL_COMMUNITY): Payer: Self-pay | Admitting: Vascular Surgery

## 2024-05-15 ENCOUNTER — Observation Stay (HOSPITAL_COMMUNITY)
Admission: RE | Admit: 2024-05-15 | Discharge: 2024-05-16 | Disposition: A | Discharge status: Home / Self Care | Attending: Neurological Surgery | Admitting: Neurological Surgery

## 2024-05-15 ENCOUNTER — Other Ambulatory Visit: Payer: Self-pay

## 2024-05-15 ENCOUNTER — Ambulatory Visit (HOSPITAL_COMMUNITY): Payer: Self-pay

## 2024-05-15 ENCOUNTER — Ambulatory Visit (HOSPITAL_COMMUNITY)

## 2024-05-15 ENCOUNTER — Encounter (HOSPITAL_COMMUNITY): Payer: Self-pay | Admitting: Neurological Surgery

## 2024-05-15 DIAGNOSIS — Z96651 Presence of right artificial knee joint: Secondary | ICD-10-CM | POA: Insufficient documentation

## 2024-05-15 DIAGNOSIS — Z853 Personal history of malignant neoplasm of breast: Secondary | ICD-10-CM | POA: Diagnosis not present

## 2024-05-15 DIAGNOSIS — Z79899 Other long term (current) drug therapy: Secondary | ICD-10-CM | POA: Diagnosis not present

## 2024-05-15 DIAGNOSIS — Z981 Arthrodesis status: Principal | ICD-10-CM

## 2024-05-15 DIAGNOSIS — M48062 Spinal stenosis, lumbar region with neurogenic claudication: Principal | ICD-10-CM | POA: Insufficient documentation

## 2024-05-15 DIAGNOSIS — M48061 Spinal stenosis, lumbar region without neurogenic claudication: Secondary | ICD-10-CM | POA: Diagnosis not present

## 2024-05-15 DIAGNOSIS — M5116 Intervertebral disc disorders with radiculopathy, lumbar region: Secondary | ICD-10-CM | POA: Diagnosis not present

## 2024-05-15 DIAGNOSIS — M5416 Radiculopathy, lumbar region: Secondary | ICD-10-CM | POA: Diagnosis not present

## 2024-05-15 DIAGNOSIS — M4316 Spondylolisthesis, lumbar region: Secondary | ICD-10-CM | POA: Diagnosis not present

## 2024-05-15 SURGERY — POSTERIOR LUMBAR FUSION 1 LEVEL
Anesthesia: General | Site: Back

## 2024-05-15 MED ORDER — CEFAZOLIN SODIUM-DEXTROSE 2-4 GM/100ML-% IV SOLN
2.0000 g | Freq: Three times a day (TID) | INTRAVENOUS | Status: AC
  Administered 2024-05-15 – 2024-05-16 (×2): 2 g via INTRAVENOUS
  Filled 2024-05-15 (×2): qty 100

## 2024-05-15 MED ORDER — THROMBIN 20000 UNITS EX SOLR: CUTANEOUS | Status: DC | PRN

## 2024-05-15 MED ORDER — FENTANYL CITRATE (PF) 250 MCG/5ML IJ SOLN
INTRAMUSCULAR | Status: AC
  Filled 2024-05-15: qty 5

## 2024-05-15 MED ORDER — PHENYLEPHRINE HCL-NACL 20-0.9 MG/250ML-% IV SOLN
INTRAVENOUS | Status: DC | PRN
  Administered 2024-05-15: 50 ug/min via INTRAVENOUS

## 2024-05-15 MED ORDER — ACETAMINOPHEN 500 MG PO TABS
1000.0000 mg | ORAL_TABLET | Freq: Once | ORAL | Status: AC
  Administered 2024-05-15: 1000 mg via ORAL
  Filled 2024-05-15: qty 2

## 2024-05-15 MED ORDER — METHOCARBAMOL 500 MG PO TABS
ORAL_TABLET | ORAL | Status: AC
  Filled 2024-05-15: qty 1

## 2024-05-15 MED ORDER — EPHEDRINE SULFATE-NACL 50-0.9 MG/10ML-% IV SOSY
PREFILLED_SYRINGE | INTRAVENOUS | Status: DC | PRN
  Administered 2024-05-15 (×2): 5 mg via INTRAVENOUS

## 2024-05-15 MED ORDER — GABAPENTIN 300 MG PO CAPS: 300.0000 mg | ORAL_CAPSULE | Freq: Every day | ORAL | Status: DC

## 2024-05-15 MED ORDER — GABAPENTIN 300 MG PO CAPS
300.0000 mg | ORAL_CAPSULE | ORAL | Status: AC
  Administered 2024-05-15: 300 mg via ORAL
  Filled 2024-05-15: qty 1

## 2024-05-15 MED ORDER — BUPIVACAINE HCL (PF) 0.25 % IJ SOLN
INTRAMUSCULAR | Status: DC | PRN
  Administered 2024-05-15: 8 mL

## 2024-05-15 MED ORDER — FENTANYL CITRATE (PF) 250 MCG/5ML IJ SOLN
INTRAMUSCULAR | Status: DC | PRN
  Administered 2024-05-15 (×3): 50 ug via INTRAVENOUS

## 2024-05-15 MED ORDER — PHENOL 1.4 % MT LIQD: 1.0000 | OROMUCOSAL | Status: DC | PRN

## 2024-05-15 MED ORDER — ONDANSETRON HCL 4 MG/2ML IJ SOLN: 4.0000 mg | Freq: Four times a day (QID) | INTRAMUSCULAR | Status: DC | PRN

## 2024-05-15 MED ORDER — ACETAMINOPHEN 500 MG PO TABS: 1000.0000 mg | ORAL_TABLET | ORAL | Status: DC

## 2024-05-15 MED ORDER — ORAL CARE MOUTH RINSE: 15.0000 mL | Freq: Once | OROMUCOSAL | Status: AC

## 2024-05-15 MED ORDER — MENTHOL 3 MG MT LOZG: 1.0000 | LOZENGE | OROMUCOSAL | Status: DC | PRN

## 2024-05-15 MED ORDER — CHLORHEXIDINE GLUCONATE CLOTH 2 % EX PADS: 6.0000 | MEDICATED_PAD | Freq: Once | CUTANEOUS | Status: DC

## 2024-05-15 MED ORDER — PROPOFOL 10 MG/ML IV BOLUS
INTRAVENOUS | Status: AC
Start: 2024-05-15 — End: 2024-05-15
  Filled 2024-05-15: qty 20

## 2024-05-15 MED ORDER — THROMBIN 20000 UNITS EX SOLR
CUTANEOUS | Status: AC
  Filled 2024-05-15: qty 20000

## 2024-05-15 MED ORDER — HYDROMORPHONE HCL 1 MG/ML IJ SOLN
INTRAMUSCULAR | Status: DC | PRN
  Administered 2024-05-15: .5 mg via INTRAVENOUS

## 2024-05-15 MED ORDER — SODIUM CHLORIDE 0.9% FLUSH
3.0000 mL | Freq: Two times a day (BID) | INTRAVENOUS | Status: DC
  Administered 2024-05-15 – 2024-05-16 (×2): 3 mL via INTRAVENOUS

## 2024-05-15 MED ORDER — CHLORHEXIDINE GLUCONATE 0.12 % MT SOLN
15.0000 mL | Freq: Once | OROMUCOSAL | Status: AC
  Administered 2024-05-15: 15 mL via OROMUCOSAL
  Filled 2024-05-15: qty 15

## 2024-05-15 MED ORDER — OXYCODONE HCL 5 MG PO TABS
ORAL_TABLET | ORAL | Status: AC
  Filled 2024-05-15: qty 1

## 2024-05-15 MED ORDER — HYDROMORPHONE HCL 1 MG/ML IJ SOLN
INTRAMUSCULAR | Status: AC
  Filled 2024-05-15: qty 0.5

## 2024-05-15 MED ORDER — PRAMIPEXOLE DIHYDROCHLORIDE ER 0.75 MG PO TB24: 0.7500 mg | ORAL_TABLET | Freq: Every day | ORAL | Status: DC

## 2024-05-15 MED ORDER — THROMBIN 5000 UNITS EX KIT
PACK | CUTANEOUS | Status: AC
  Filled 2024-05-15: qty 1

## 2024-05-15 MED ORDER — ONDANSETRON HCL 4 MG/2ML IJ SOLN: 4.0000 mg | Freq: Once | INTRAMUSCULAR | Status: DC | PRN

## 2024-05-15 MED ORDER — ALBUMIN HUMAN 5 % IV SOLN
INTRAVENOUS | Status: AC
  Filled 2024-05-15: qty 250

## 2024-05-15 MED ORDER — SODIUM CHLORIDE 0.9% FLUSH
3.0000 mL | Freq: Two times a day (BID) | INTRAVENOUS | Status: DC
  Administered 2024-05-15 – 2024-05-16 (×2): 10 mL via INTRAVENOUS

## 2024-05-15 MED ORDER — SENNA 8.6 MG PO TABS
1.0000 | ORAL_TABLET | Freq: Two times a day (BID) | ORAL | Status: DC
  Administered 2024-05-15 – 2024-05-16 (×2): 8.6 mg via ORAL
  Filled 2024-05-15 (×2): qty 1

## 2024-05-15 MED ORDER — ACETAMINOPHEN 325 MG PO TABS: 650.0000 mg | ORAL_TABLET | ORAL | Status: DC | PRN

## 2024-05-15 MED ORDER — LACTATED RINGERS IV SOLN: INTRAVENOUS | Status: DC | PRN

## 2024-05-15 MED ORDER — ALBUMIN HUMAN 5 % IV SOLN
12.5000 g | Freq: Once | INTRAVENOUS | Status: AC
  Administered 2024-05-15: 12.5 g via INTRAVENOUS

## 2024-05-15 MED ORDER — ONDANSETRON HCL 4 MG/2ML IJ SOLN
INTRAMUSCULAR | Status: DC | PRN
  Administered 2024-05-15: 4 mg via INTRAVENOUS

## 2024-05-15 MED ORDER — FENTANYL CITRATE (PF) 100 MCG/2ML IJ SOLN
25.0000 ug | INTRAMUSCULAR | Status: DC | PRN
  Administered 2024-05-15 (×3): 25 ug via INTRAVENOUS

## 2024-05-15 MED ORDER — DOCUSATE SODIUM 100 MG PO CAPS
100.0000 mg | ORAL_CAPSULE | Freq: Two times a day (BID) | ORAL | Status: DC
  Administered 2024-05-15 – 2024-05-16 (×2): 100 mg via ORAL
  Filled 2024-05-15 (×2): qty 1

## 2024-05-15 MED ORDER — IPRATROPIUM BROMIDE 0.06 % NA SOLN
2.0000 | Freq: Four times a day (QID) | NASAL | Status: DC
  Filled 2024-05-15 (×3): qty 15

## 2024-05-15 MED ORDER — PROPOFOL 10 MG/ML IV BOLUS
INTRAVENOUS | Status: DC | PRN
  Administered 2024-05-15: 140 mg via INTRAVENOUS

## 2024-05-15 MED ORDER — SODIUM CHLORIDE 0.9% FLUSH: 3.0000 mL | INTRAVENOUS | Status: DC | PRN

## 2024-05-15 MED ORDER — BUPIVACAINE HCL (PF) 0.25 % IJ SOLN
INTRAMUSCULAR | Status: AC
  Filled 2024-05-15: qty 30

## 2024-05-15 MED ORDER — ONDANSETRON HCL 4 MG PO TABS
4.0000 mg | ORAL_TABLET | Freq: Four times a day (QID) | ORAL | Status: DC | PRN
  Administered 2024-05-15 – 2024-05-16 (×2): 4 mg via ORAL
  Filled 2024-05-15 (×2): qty 1

## 2024-05-15 MED ORDER — ACETAMINOPHEN 650 MG RE SUPP: 650.0000 mg | RECTAL | Status: DC | PRN

## 2024-05-15 MED ORDER — CELECOXIB 200 MG PO CAPS
200.0000 mg | ORAL_CAPSULE | Freq: Two times a day (BID) | ORAL | Status: DC
Start: 2024-05-15 — End: 2024-05-16
  Administered 2024-05-15: 200 mg via ORAL
  Filled 2024-05-15 (×3): qty 1

## 2024-05-15 MED ORDER — OXYCODONE HCL 5 MG PO TABS
5.0000 mg | ORAL_TABLET | ORAL | Status: DC | PRN
  Administered 2024-05-15: 5 mg via ORAL
  Filled 2024-05-15 (×2): qty 1

## 2024-05-15 MED ORDER — 0.9 % SODIUM CHLORIDE (POUR BTL) OPTIME
TOPICAL | Status: DC | PRN
Start: 2024-05-15 — End: 2024-05-15
  Administered 2024-05-15: 1000 mL

## 2024-05-15 MED ORDER — PROPOFOL 500 MG/50ML IV EMUL
INTRAVENOUS | Status: DC | PRN
  Administered 2024-05-15: 25 ug/kg/min via INTRAVENOUS

## 2024-05-15 MED ORDER — FENTANYL CITRATE (PF) 100 MCG/2ML IJ SOLN
INTRAMUSCULAR | Status: AC
  Filled 2024-05-15: qty 2

## 2024-05-15 MED ORDER — ALBUMIN HUMAN 5 % IV SOLN: INTRAVENOUS | Status: DC | PRN

## 2024-05-15 MED ORDER — OXYCODONE HCL 5 MG PO TABS
5.0000 mg | ORAL_TABLET | Freq: Once | ORAL | Status: AC | PRN
  Administered 2024-05-15: 5 mg via ORAL

## 2024-05-15 MED ORDER — LIDOCAINE 2% (20 MG/ML) 5 ML SYRINGE
INTRAMUSCULAR | Status: DC | PRN
  Administered 2024-05-15: 50 mg via INTRAVENOUS

## 2024-05-15 MED ORDER — SODIUM CHLORIDE 0.9 % IV SOLN: 250.0000 mL | INTRAVENOUS | Status: DC

## 2024-05-15 MED ORDER — METHOCARBAMOL 500 MG PO TABS
500.0000 mg | ORAL_TABLET | Freq: Four times a day (QID) | ORAL | Status: DC | PRN
  Administered 2024-05-15 – 2024-05-16 (×2): 500 mg via ORAL
  Filled 2024-05-15: qty 1

## 2024-05-15 MED ORDER — SUGAMMADEX SODIUM 200 MG/2ML IV SOLN
INTRAVENOUS | Status: DC | PRN
  Administered 2024-05-15: 200 mg via INTRAVENOUS

## 2024-05-15 MED ORDER — ROCURONIUM BROMIDE 10 MG/ML (PF) SYRINGE
PREFILLED_SYRINGE | INTRAVENOUS | Status: DC | PRN
  Administered 2024-05-15: 50 mg via INTRAVENOUS
  Administered 2024-05-15: 10 mg via INTRAVENOUS

## 2024-05-15 MED ORDER — CITALOPRAM HYDROBROMIDE 20 MG PO TABS
40.0000 mg | ORAL_TABLET | Freq: Every day | ORAL | Status: DC
  Administered 2024-05-15: 40 mg via ORAL
  Filled 2024-05-15 (×2): qty 2

## 2024-05-15 MED ORDER — METHOCARBAMOL 1000 MG/10ML IJ SOLN: 500.0000 mg | Freq: Four times a day (QID) | INTRAMUSCULAR | Status: DC | PRN

## 2024-05-15 MED ORDER — OXYCODONE HCL 5 MG/5ML PO SOLN: 5.0000 mg | Freq: Once | ORAL | Status: AC | PRN

## 2024-05-15 MED ORDER — ACETAMINOPHEN 500 MG PO TABS
1000.0000 mg | ORAL_TABLET | Freq: Four times a day (QID) | ORAL | Status: DC
  Administered 2024-05-15 – 2024-05-16 (×3): 1000 mg via ORAL
  Filled 2024-05-15 (×3): qty 2

## 2024-05-15 MED ORDER — CEFAZOLIN SODIUM-DEXTROSE 2-4 GM/100ML-% IV SOLN
2.0000 g | INTRAVENOUS | Status: AC
  Administered 2024-05-15: 2 g via INTRAVENOUS
  Filled 2024-05-15: qty 100

## 2024-05-15 MED ORDER — CLONAZEPAM 0.5 MG PO TABS
1.0000 mg | ORAL_TABLET | Freq: Every day | ORAL | Status: DC
  Administered 2024-05-15: 1 mg via ORAL
  Filled 2024-05-15: qty 1

## 2024-05-15 MED ORDER — BUPROPION HCL ER (XL) 150 MG PO TB24
150.0000 mg | ORAL_TABLET | Freq: Every day | ORAL | Status: DC
  Administered 2024-05-16: 150 mg via ORAL
  Filled 2024-05-15: qty 1

## 2024-05-15 MED ORDER — EPHEDRINE 5 MG/ML INJ
INTRAVENOUS | Status: AC
  Filled 2024-05-15: qty 5

## 2024-05-15 MED ORDER — PRAMIPEXOLE DIHYDROCHLORIDE 0.25 MG PO TABS
0.5000 mg | ORAL_TABLET | Freq: Two times a day (BID) | ORAL | Status: DC
  Administered 2024-05-15: 0.5 mg via ORAL
  Filled 2024-05-15 (×2): qty 2

## 2024-05-15 MED ORDER — LACTATED RINGERS IV SOLN: INTRAVENOUS | Status: DC

## 2024-05-15 MED ORDER — THROMBIN 5000 UNITS EX SOLR: CUTANEOUS | Status: DC | PRN

## 2024-05-15 SURGICAL SUPPLY — 54 items
ALLOGRAFT BONESTRIP KORE 2.5X5 (Bone Implant) IMPLANT
BAG COUNTER SPONGE SURGICOUNT (BAG) ×1 IMPLANT
BASKET BONE COLLECTION (BASKET) ×1 IMPLANT
BENZOIN TINCTURE PRP APPL 2/3 (GAUZE/BANDAGES/DRESSINGS) ×1 IMPLANT
BLADE BONE MILL MEDIUM (MISCELLANEOUS) ×1 IMPLANT
BLADE CLIPPER SURG (BLADE) IMPLANT
BUR CARBIDE MATCH 3.0 (BURR) ×1 IMPLANT
CANISTER SUCTION 3000ML PPV (SUCTIONS) ×1 IMPLANT
CLEANSER WND VASHE 34 (WOUND CARE) ×1 IMPLANT
CNTNR URN SCR LID CUP LEK RST (MISCELLANEOUS) ×1 IMPLANT
COVER BACK TABLE 60X90IN (DRAPES) ×1 IMPLANT
DERMABOND ADVANCED .7 DNX12 (GAUZE/BANDAGES/DRESSINGS) ×1 IMPLANT
DRAPE C-ARM 42X72 X-RAY (DRAPES) ×2 IMPLANT
DRAPE C-ARMOR (DRAPES) ×1 IMPLANT
DRAPE LAPAROTOMY 100X72X124 (DRAPES) ×1 IMPLANT
DRAPE SURG 17X23 STRL (DRAPES) ×1 IMPLANT
DRSG OPSITE POSTOP 4X6 (GAUZE/BANDAGES/DRESSINGS) IMPLANT
DURAPREP 26ML APPLICATOR (WOUND CARE) ×1 IMPLANT
ELECTRODE REM PT RTRN 9FT ADLT (ELECTROSURGICAL) ×1 IMPLANT
EVACUATOR 1/8 PVC DRAIN (DRAIN) ×1 IMPLANT
GAUZE 4X4 16PLY ~~LOC~~+RFID DBL (SPONGE) IMPLANT
GLOVE BIO SURGEON STRL SZ7 (GLOVE) IMPLANT
GLOVE BIO SURGEON STRL SZ8 (GLOVE) ×2 IMPLANT
GLOVE BIOGEL PI IND STRL 7.0 (GLOVE) IMPLANT
GOWN STRL REUS W/ TWL LRG LVL3 (GOWN DISPOSABLE) IMPLANT
GOWN STRL REUS W/ TWL XL LVL3 (GOWN DISPOSABLE) ×2 IMPLANT
GOWN STRL REUS W/TWL 2XL LVL3 (GOWN DISPOSABLE) IMPLANT
HEMOSTAT POWDER KIT SURGIFOAM (HEMOSTASIS) ×1 IMPLANT
KIT BASIN OR (CUSTOM PROCEDURE TRAY) ×1 IMPLANT
KIT TURNOVER KIT B (KITS) ×1 IMPLANT
MILL BONE PREP (MISCELLANEOUS) ×1 IMPLANT
NDL HYPO 25X1 1.5 SAFETY (NEEDLE) ×1 IMPLANT
NEEDLE HYPO 25X1 1.5 SAFETY (NEEDLE) ×1 IMPLANT
NS IRRIG 1000ML POUR BTL (IV SOLUTION) ×1 IMPLANT
PACK LAMINECTOMY NEURO (CUSTOM PROCEDURE TRAY) ×1 IMPLANT
PAD ARMBOARD POSITIONER FOAM (MISCELLANEOUS) ×3 IMPLANT
PATTIES SURGICAL .5 X.5 (GAUZE/BANDAGES/DRESSINGS) ×1 IMPLANT
PATTIES SURGICAL 1X1 (DISPOSABLE) ×1 IMPLANT
ROD LORD LIPPED TI 5.5X45 (Rod) IMPLANT
ROD LORD LIPPED TI 5.5X50 (Rod) IMPLANT
SCREW POLYAXIAL TULIP (Screw) IMPLANT
SCREW SHANK MOD 5.5X40 (Screw) IMPLANT
SET SCREW SPNE (Screw) IMPLANT
SPACER IDENTITI 9X7X25 5D (Spacer) IMPLANT
SPONGE SURGIFOAM ABS GEL 100 (HEMOSTASIS) ×1 IMPLANT
SPONGE T-LAP 4X18 ~~LOC~~+RFID (SPONGE) IMPLANT
STRIP CLOSURE SKIN 1/2X4 (GAUZE/BANDAGES/DRESSINGS) ×1 IMPLANT
SUT VIC AB 0 CT1 18XCR BRD8 (SUTURE) ×1 IMPLANT
SUT VIC AB 2-0 CP2 18 (SUTURE) ×1 IMPLANT
SUT VIC AB 3-0 SH 8-18 (SUTURE) ×2 IMPLANT
SYR 30ML SLIP (SYRINGE) ×1 IMPLANT
TOWEL GREEN STERILE (TOWEL DISPOSABLE) ×1 IMPLANT
TOWEL GREEN STERILE FF (TOWEL DISPOSABLE) ×1 IMPLANT
WATER STERILE IRR 1000ML POUR (IV SOLUTION) ×1 IMPLANT

## 2024-05-15 NOTE — Anesthesia Postprocedure Evaluation (Signed)
 Anesthesia Post Note  Patient: Alexis Fox  Procedure(s) Performed: LUMBAR ONE-TWO POSTERIOR LUMBAR FUSION WITH EXTENSION OF FUSION (Back)     Patient location during evaluation: PACU Anesthesia Type: General Level of consciousness: oriented, patient cooperative and sedated Pain management: pain level controlled Vital Signs Assessment: post-procedure vital signs reviewed and stable Respiratory status: spontaneous breathing, nonlabored ventilation and respiratory function stable Cardiovascular status: blood pressure returned to baseline and stable Postop Assessment: no apparent nausea or vomiting Anesthetic complications: no  No notable events documented.  Last Vitals:  Vitals:   05/15/24 1715 05/15/24 1730  BP: (!) 99/53 (!) 97/53  Pulse: 66 60  Resp: 19 19  Temp: 36.4 C   SpO2: 98% 99%    Last Pain:  Vitals:   05/15/24 1715  TempSrc:   PainSc: Asleep                 Emeka Lindner,E. Izel Eisenhardt

## 2024-05-15 NOTE — Anesthesia Procedure Notes (Signed)
 Procedure Name: Intubation Date/Time: 05/15/2024 3:25 PM  Performed by: Hebert Littler, CRNAPre-anesthesia Checklist: Patient identified, Emergency Drugs available, Suction available, Patient being monitored and Timeout performed Patient Re-evaluated:Patient Re-evaluated prior to induction Oxygen Delivery Method: Circle system utilized Preoxygenation: Pre-oxygenation with 100% oxygen Induction Type: IV induction Ventilation: Mask ventilation without difficulty Laryngoscope Size: Glidescope and 3 Grade View: Grade I Tube type: Oral Tube size: 7.0 mm Number of attempts: 1 Airway Equipment and Method: Video-laryngoscopy Placement Confirmation: ETT inserted through vocal cords under direct vision, positive ETCO2, CO2 detector and breath sounds checked- equal and bilateral Secured at: 22 cm Tube secured with: Tape

## 2024-05-15 NOTE — Op Note (Signed)
 05/15/2024  4:58 PM  PATIENT:  Alexis Fox  68 y.o. female  PRE-OPERATIVE DIAGNOSIS: Retrolisthesis L1-2, degenerative disc disease L1-2, spinal stenosis L1-2, back pain with radiculopathy  POST-OPERATIVE DIAGNOSIS:  same  PROCEDURE:   1. Decompressive lumbar laminectomy, hemi facetectomy and foraminotomies L1-2 requiring more work than would be required for a simple exposure of the disk for PLIF in order to adequately decompress the neural elements and address the spinal stenosis 2. Posterior lumbar interbody fusion L1-2 using porous titanium interbody cages packed with morcellized allograft and autograft  3. Posterior fixation L1-2 using ATEC cortical pedicle screws.  4. Intertransverse arthrodesis L1-2 using morcellized autograft and allograft. 5.  Lumbar reexploration with extraction of segmental fixation L2-L4  SURGEON:  Waymond Hailey, MD  ASSISTANTS: Jackee Marus, FNP  ANESTHESIA:  General  EBL: 200 ml  Total I/O In: 1450 [I.V.:1100; IV Piggyback:350] Out: 200 [Blood:200]  BLOOD ADMINISTERED: None  DRAINS: none   INDICATION FOR PROCEDURE: This patient presented with back pain and leg pain. Imaging revealed adjacent level disease at L1-2 with retrolisthesis, degenerative disc disease and spinal stenosis. The patient tried a reasonable attempt at conservative medical measures without relief. I recommended decompression and instrumented fusion to address the stenosis as well as the segmental  instability.  Patient understood the risks, benefits, and alternatives and potential outcomes and wished to proceed.  PROCEDURE DETAILS:  The patient was brought to the operating room. After induction of generalized endotracheal anesthesia the patient was rolled into the prone position on chest rolls and all pressure points were padded. The patient's lumbar region was cleaned and then prepped with DuraPrep and draped in the usual sterile fashion. Anesthesia was injected and then  a dorsal midline incision was made and carried down to the lumbosacral fascia. The fascia was opened and the paraspinous musculature was taken down in a subperiosteal fashion to expose L1-2 as well as the previously placed instrumentation. A self-retaining retractor was placed. Intraoperative fluoroscopy confirmed my level, and I started with removal of the old instrumentation.  The locking caps were removed and the rods were removed.  The screws had excellent purchase.  We then placed our L1 pedicle screws.  We marked our entry zones utilizing surface landmarks and AP fluoroscopy.  We drilled each pedicle and then tapped each pedicle with a 5.5 tap, palpated with a ball probe and then placed 5.5 x 40 mm pedicle screws at L1.  Checked this with AP and lateral fluoroscopy.  Felt they had good positioning.   I then turned my attention to the decompression and complete lumbar laminectomies, hemi- facetectomies, and foraminotomies were performed at L1-2.  My nurse practitioner was directly involved in the decompression and exposure of the neural elements. the patient had significant spinal stenosis and this required more work than would be required for a simple exposure of the disc for posterior lumbar interbody fusion which would only require a limited laminotomy. Much more generous decompression and generous foraminotomy was undertaken in order to adequately decompress the neural elements and address the patient's leg pain. The yellow ligament was removed to expose the underlying dura and nerve roots, and generous foraminotomies were performed to adequately decompress the neural elements. Both the exiting and traversing nerve roots were decompressed on both sides until a coronary dilator passed easily along the nerve roots. Once the decompression was complete, I turned my attention to the posterior lower lumbar interbody fusion. The epidural venous vasculature was coagulated and cut sharply. Disc space was  incised  and the initial discectomy was performed with pituitary rongeurs. The disc space was distracted with sequential distractors to a height of 7 mm. We then used a series of scrapers and shavers to prepare the endplates for fusion. The midline was prepared with Epstein curettes. Once the complete discectomy was finished, we packed an appropriate sized interbody cage with local autograft and morcellized allograft, gently retracted the nerve root, and tapped the cage into position at L1-2.  The midline between the cages was packed with morselized autograft and allograft.     We then decorticated the transverse processes and laid a mixture of morcellized autograft and allograft out over these to perform intertransverse arthrodesis at L1-2. We then placed lordotic rods into the multiaxial screw heads of the pedicle screws and locked these in position with the locking caps and anti-torque device. We then checked our construct with AP and lateral fluoroscopy. Irrigated with copious amounts of saline solution. Inspected the nerve roots once again to assure adequate decompression, lined to the dura with Gelfoam,  and then we closed the muscle and the fascia with 0 Vicryl. Closed the subcutaneous tissues with 2-0 Vicryl and subcuticular tissues with 3-0 Vicryl. The skin was closed with benzoin and Steri-Strips. Dressing was then applied, the patient was awakened from general anesthesia and transported to the recovery room in stable condition. At the end of the procedure all sponge, needle and instrument counts were correct.   PLAN OF CARE: admit to inpatient  PATIENT DISPOSITION:  PACU - hemodynamically stable.   Delay start of Pharmacological VTE agent (>24hrs) due to surgical blood loss or risk of bleeding:  yes

## 2024-05-15 NOTE — H&P (Signed)
 Subjective: Patient is a 68 y.o. female admitted for back and R leg pain. Onset of symptoms was several months ago, gradually worsening since that time.  The pain is rated severe, and is located at the across the lower back and radiates to R leg. The pain is described as aching and occurs all day. The symptoms have been progressive. Symptoms are exacerbated by exercise, standing, and walking for more than a few minutes. MRI or CT showed adjacent level stenosis L1-2   Past Medical History:  Diagnosis Date   Anxiety    Arthritis    knees   Breast cancer (HCC)    Breast cancer of upper-outer quadrant of left female breast (HCC) 03/20/2016   C2 cervical fracture (HCC)    10/22/18, following fall from ladder   Cervicalgia    Cholecystitis    Complication of anesthesia    BP crashes post op   Early cataracts, bilateral    Family history of brain cancer    Family history of breast cancer    Hot flashes    Personal history of chemotherapy    Personal history of radiation therapy    Restless leg syndrome    Sleep apnea 10/03/2018   wears CPAP    Past Surgical History:  Procedure Laterality Date   ANTERIOR CERVICAL DECOMP/DISCECTOMY FUSION N/A 01/22/2020   Procedure: ACDF - C2-C3;  Surgeon: Isadora Mar, MD;  Location: [facility] OR;  Service: Neurosurgery;  Laterality: N/A;   BACK SURGERY     x2   BREAST BIOPSY     BREAST LUMPECTOMY Left 2018   BUNIONECTOMY     CHOLECYSTECTOMY N/A 10/04/2015   Procedure: LAPAROSCOPIC CHOLECYSTECTOMY WITH INTRAOPERATIVE CHOLANGIOGRAM;  Surgeon: Aldean Hummingbird, MD;  Location: [facility] OR;  Service: General;  Laterality: N/A;   ELBOW SURGERY     right for epicondylitis 2010    ESOPHAGEAL MANOMETRY N/A 11/30/2020   Procedure: ESOPHAGEAL MANOMETRY (EM);  Surgeon: Genell Ken, MD;  Location: [facility];  Service: Gastroenterology;  Laterality: N/A;   FOOT SURGERY     1983 -tarsal tunnel release   IR GENERIC HISTORICAL  10/26/2016   IR CV LINE INJECTION 10/26/2016  [facility]-INTER[facility] RAD   LUMBAR DISC SURGERY  04/04/2012   MASS EXCISION Left 02/06/2021   Procedure: EXCISION LEFT FOOT SOFT TISSUE BETWEEN SECOND AND THIRD AND THIRD AND FOURTH TOES;  Surgeon: Lizzie Riis, DPM;  Location: [facility];  Service: Podiatry;  Laterality: Left;   PORT-A-CATH REMOVAL N/A 11/15/2016   Procedure: REMOVAL PORT-A-CATH;  Surgeon: Enid Harry, MD;  Location: [facility];  Service: General;  Laterality: N/A;   PORTACATH PLACEMENT Right 04/02/2016   Procedure: INSERTION PORT-A-CATH WITH US ;  Surgeon: Enid Harry, MD;  Location: [facility];  Service: General;  Laterality: Right;   RADIOACTIVE SEED GUIDED PARTIAL MASTECTOMY/AXILLARY SENTINEL NODE BIOPSY/AXILLARY NODE DISSECTION Left 09/12/2016   Procedure: LEFT BREAST SEED GUIDED LUMPECTOMY, LEFT AXILLARY SENTINEL NODE BIOPSY, LEFT SEED GUIDED AXILLARY NODE EXCISION( TARGETED AXILLARY DISSECTION), BLUE DYE INJECTION;  Surgeon: Enid Harry, MD;  Location: [facility];  Service: General;  Laterality: Left;   SACROILIAC JOINT FUSION Left 05/18/2020   Procedure: LEFT SACROILIAC JOINT FUSION;  Surgeon: Virl Grimes, MD;  Location: [facility] OR;  Service: Orthopedics;  Laterality: Left;   SPINAL FUSION  5/12   L5-S1   SPINAL FUSION  05/18/2019   L4-5   TARSAL TUNNEL RELEASE     TOTAL KNEE ARTHROPLASTY Right 09/03/2023   Procedure:  RIGHT TOTAL KNEE ARTHROPLASTY;  Surgeon: Jasmine Mesi, MD;  Location: [facility] OR;  Service: Orthopedics;  Laterality: Right;    Prior to Admission medications   Medication Sig Start Date End Date Taking? Authorizing Provider  buPROPion  (WELLBUTRIN  XL) 150 MG 24 hr tablet Take 150 mg by mouth daily. 03/06/16  Yes [provider]  cetirizine (ZYRTEC) 10 MG tablet Take 10 mg by mouth at bedtime.   Yes [provider]  Cholecalciferol (VITAMIN D) 50 MCG (2000 UT) tablet Take 2,000 Units by mouth 2 (two) times daily.   Yes [provider]  citalopram  (CELEXA ) 40 MG tablet Take 40 mg by mouth daily. 12/21/15  Yes [provider]  clonazePAM  (KLONOPIN ) 1 MG tablet Take 1 tablet (1 mg total) by mouth at bedtime. 03/13/24  Yes Millikan, Megan, NP  docusate sodium  (COLACE) 100 MG capsule Take 100 mg by mouth 2 (two) times daily.   Yes [provider]  fluticasone  (FLONASE ) 50 MCG/ACT nasal spray Place 1 spray into both nostrils daily. 03/08/09  Yes [provider]  gabapentin  (NEURONTIN ) 300 MG capsule Take 300 mg by mouth at bedtime.   Yes [provider]  Multiple Vitamins-Minerals (MULTIVITAMIN WITH MINERALS) tablet Take 1 tablet by mouth at bedtime.   Yes [provider]  NON FORMULARY Pt uses a c-pap nightly   Yes [provider]  pramipexole  (MIRAPEX ) 0.5 MG tablet Take 1 tablet (0.5 mg total) by mouth daily as needed. TAKE 1 TABLET (0.5 MG TOTAL) BY MOUTH DAILY AS NEEDED (TAKE ALONG WITH EXTENDED RELEASE DOSE) Strength: 0.5 mg Patient taking differently: Take 0.5 mg by mouth in the morning and at bedtime. 03/13/24  Yes Millikan, Megan, NP  Pramipexole  Dihydrochloride (MIRAPEX  ER) 0.75 MG TB24 Take 1 tablet (0.75 mg total) by mouth at bedtime. 03/13/24  Yes Millikan, Megan, NP  acetaminophen  (TYLENOL ) 500 MG tablet Take 1,000 mg by mouth every 8 (eight) hours as needed for moderate pain.    [provider]  clobetasol  ointment (TEMOVATE ) 0.05 % Apply 1 application topically 2 (two) times daily. Patient not taking: Reported on 05/06/2024 11/28/20   Antonio Klinefelter A, NP  ipratropium (ATROVENT) 0.06 % nasal spray Place 2 sprays into both nostrils 4 (four) times daily.    [provider]   Allergies  Allergen Reactions   Turmeric Diarrhea    Severe diarrhea   Other     Pt can not take steroids via IV or orally   Decadron  [Dexamethasone ]     Exacerbates restless leg    Social History   Tobacco Use   Smoking status: Never   Smokeless tobacco:  Never  Substance Use Topics   Alcohol use: Yes    Alcohol/week: 1.0 standard drink of alcohol    Types: 1 Cans of beer per week    Comment: Rarely 1/month    Family History  Problem Relation Age of Onset   Heart disease Mother    Pulmonary fibrosis Mother    Hypertension Mother    Sleep apnea Mother    Cancer Father 107       astocytoma   Hypertension Brother    Lymphoma Maternal Aunt    Anesthesia problems Neg Hx      Review of Systems  Positive ROS: neg  All other systems have been reviewed and were otherwise negative with the exception of those mentioned in the HPI and as above.  Objective: Vital signs in last 24 hours: Temp:  [98.1  F (36.7 C)] 98.1 F (36.7 C) (06/13 1206) Pulse Rate:  [79] 79 (06/13 1206) Resp:  [19] 19 (06/13 1206) BP: (131)/(70) 131/70 (06/13 1206) SpO2:  [95 %] 95 % (06/13 1206) Weight:  [97.5 kg] 97.5 kg (06/13 1206)  General Appearance: Alert, cooperative, no distress, appears stated age Head: Normocephalic, without obvious abnormality, atraumatic Eyes: PERRL, conjunctiva/corneas clear, EOM's intact    Neck: Supple, symmetrical, trachea midline Back: Symmetric, no curvature, ROM normal, no CVA tenderness Lungs:  respirations unlabored Heart: Regular rate and rhythm Abdomen: Soft, non-tender Extremities: Extremities normal, atraumatic, no cyanosis or edema Pulses: 2+ and symmetric all extremities Skin: Skin color, texture, turgor normal, no rashes or lesions  NEUROLOGIC:   Mental status: Alert and oriented x4,  no aphasia, good attention span, fund of knowledge, and memory Motor Exam - grossly normal Sensory Exam - grossly normal Reflexes: 1+ Coordination - grossly normal Gait - grossly normal Balance - grossly normal Cranial Nerves: I: smell Not tested  II: visual acuity  OS: nl    OD: nl  II: visual fields Full to confrontation  II: pupils Equal, round, reactive to light  III,VII: ptosis None  III,IV,VI: extraocular muscles   Full ROM  V: mastication Normal  V: facial light touch sensation  Normal  V,VII: corneal reflex  Present  VII: facial muscle function - upper  Normal  VII: facial muscle function - lower Normal  VIII: hearing Not tested  IX: soft palate elevation  Normal  IX,X: gag reflex Present  XI: trapezius strength  5/5  XI: sternocleidomastoid strength 5/5  XI: neck flexion strength  5/5  XII: tongue strength  Normal    Data Review Lab Results  Component Value Date   WBC 7.6 05/07/2024   HGB 12.4 05/07/2024   HCT 38.8 05/07/2024   MCV 93.7 05/07/2024   PLT 232 05/07/2024   Lab Results  Component Value Date   NA 134 (L) 08/21/2023   K 4.6 08/21/2023   CL 103 08/21/2023   CO2 24 08/21/2023   BUN 18 08/21/2023   CREATININE 0.85 08/21/2023   GLUCOSE 92 08/21/2023   Lab Results  Component Value Date   INR 1.0 08/10/2022    Assessment/Plan:  Estimated body mass index is 36.9 kg/m as calculated from the following:   Height as of this encounter: 5' 4 (1.626 m).   Weight as of this encounter: 97.5 kg. Patient admitted for PLIF L1-2. Patient has failed a reasonable attempt at conservative therapy.  I explained the condition and procedure to the patient and answered any questions.  Patient wishes to proceed with procedure as planned. Understands risks/ benefits and typical outcomes of procedure.   Isadora Mar 05/15/2024 2:29 PM

## 2024-05-15 NOTE — Transfer of Care (Signed)
 Immediate Anesthesia Transfer of Care Note  Patient: Alexis Fox  Procedure(s) Performed: LUMBAR ONE-TWO POSTERIOR LUMBAR FUSION WITH EXTENSION OF FUSION (Back)  Patient Location: PACU  Anesthesia Type:General  Level of Consciousness: drowsy and patient cooperative  Airway & Oxygen Therapy: Patient Spontanous Breathing and Patient connected to face mask oxygen  Post-op Assessment: Report given to RN, Post -op Vital signs reviewed and stable, Patient moving all extremities, and Patient moving all extremities X 4  Post vital signs: Reviewed and stable  Last Vitals:  Vitals Value Taken Time  BP 118/60 05/15/24 17:09  Temp    Pulse 61 05/15/24 17:11  Resp 14 05/15/24 17:11  SpO2 98 % 05/15/24 17:11  Vitals shown include unfiled device data.  Last Pain:  Vitals:   05/15/24 1224  TempSrc:   PainSc: 1          Complications: No notable events documented.

## 2024-05-16 DIAGNOSIS — M5416 Radiculopathy, lumbar region: Secondary | ICD-10-CM | POA: Diagnosis not present

## 2024-05-16 DIAGNOSIS — M48062 Spinal stenosis, lumbar region with neurogenic claudication: Secondary | ICD-10-CM | POA: Diagnosis not present

## 2024-05-16 DIAGNOSIS — Z96651 Presence of right artificial knee joint: Secondary | ICD-10-CM | POA: Diagnosis not present

## 2024-05-16 DIAGNOSIS — Z79899 Other long term (current) drug therapy: Secondary | ICD-10-CM | POA: Diagnosis not present

## 2024-05-16 DIAGNOSIS — Z853 Personal history of malignant neoplasm of breast: Secondary | ICD-10-CM | POA: Diagnosis not present

## 2024-05-16 MED ORDER — METHOCARBAMOL 500 MG PO TABS: 500.0000 mg | ORAL_TABLET | Freq: Four times a day (QID) | ORAL | 3 refills | Status: DC | PRN

## 2024-05-16 MED ORDER — OXYCODONE HCL 5 MG PO TABS
5.0000 mg | ORAL_TABLET | ORAL | Status: DC | PRN
  Administered 2024-05-16: 5 mg via ORAL
  Administered 2024-05-16 (×2): 10 mg via ORAL
  Filled 2024-05-16: qty 1
  Filled 2024-05-16 (×2): qty 2

## 2024-05-16 MED ORDER — OXYCODONE HCL 5 MG PO TABS: 5.0000 mg | ORAL_TABLET | ORAL | 0 refills | Status: DC | PRN

## 2024-05-16 NOTE — Evaluation (Signed)
 Occupational Therapy Evaluation Patient Details Name: Alexis Fox MRN: [id number] DOB: [birth year] Today's Date: 05/16/2024   History of Present Illness   Pt is a 68yo female who underwent PLIF at L1-2 on 6/13 due to stenosis at L1-2. PMH: breast ca, previous back fusion, ACDF C2-3, R TKA     Clinical Impressions PTA, pt lived with husband who intermittently assists with LB ADL. Upon eval, pt performing UB ADL with set-up and LB ADL with min A. Pt educated and demonstrating use of compensatory techniques for bed mobility, LB ADL, grooming, toileting, and shower transfers within precautions. Pt has and is aware of available AE.  Will follow for one more session acutely as pt limited by drowsiness to reinforce education, but do not anticipate need for OT follow up after discharge.         If plan is discharge home, recommend the following:   A little help with walking and/or transfers;A little help with bathing/dressing/bathroom;Assistance with cooking/housework;Assist for transportation;Help with stairs or ramp for entrance     Functional Status Assessment   Patient has had a recent decline in their functional status and demonstrates the ability to make significant improvements in function in a reasonable and predictable amount of time.     Equipment Recommendations   None recommended by OT     Recommendations for Other Services         Precautions/Restrictions   Precautions Precautions: Back Precaution Booklet Issued: Yes (comment) Recall of Precautions/Restrictions: Intact Precaution/Restrictions Comments: recalled 2/3 from previous surgery Restrictions Weight Bearing Restrictions Per Provider Order: No     Mobility Bed Mobility               General bed mobility comments: pt received sitting EOB with RN    Transfers Overall transfer level: Needs assistance Equipment used: Rolling walker (2 wheels) Transfers: Sit to/from Stand Sit to  Stand: Supervision           General transfer comment: verbal cues to not pull up on RW      Balance Overall balance assessment: Mild deficits observed, not formally tested                                         ADL either performed or assessed with clinical judgement   ADL Overall ADL's : Needs assistance/impaired Eating/Feeding: Independent   Grooming: Standing;Contact guard assist   Upper Body Bathing: Set up;Sitting   Lower Body Bathing: Minimal assistance;Sit to/from stand   Upper Body Dressing : Set up;Sitting   Lower Body Dressing: Minimal assistance;Sit to/from stand Lower Body Dressing Details (indicate cue type and reason): for R sock; pt with recent R knee replacement Toilet Transfer: Supervision/safety     Toileting - Clothing Manipulation Details (indicate cue type and reason): reviewed compensatory technques   Tub/Shower Transfer Details (indicate cue type and reason): pt deferred         Vision Baseline Vision/History: 1 Wears glasses Ability to See in Adequate Light: 0 Adequate Patient Visual Report: No change from baseline Vision Assessment?: No apparent visual deficits     Perception Perception: Not tested       Praxis Praxis: Not tested       Pertinent Vitals/Pain Pain Assessment Pain Assessment: 0-10 Pain Score: 4  Pain Location: back Pain Descriptors / Indicators: Constant Pain Intervention(s): Limited activity within patient's tolerance, Monitored during session  Extremity/Trunk Assessment Upper Extremity Assessment Upper Extremity Assessment: Generalized weakness   Lower Extremity Assessment Lower Extremity Assessment: Defer to PT evaluation   Cervical / Trunk Assessment Cervical / Trunk Assessment: Back Surgery   Communication Communication Communication: No apparent difficulties   Cognition Arousal: Lethargic (A&0x4 however sleepy and difficulty keeping eyes open, states hard to hold my head  up) Behavior During Therapy: [facility] for tasks assessed/performed Cognition: No apparent impairments                               Following commands: Intact       Cueing  General Comments   Cueing Techniques: Verbal cues  pt able to done back brace, BP 90s/70s   Exercises     Shoulder Instructions      Home Living Family/patient expects to be discharged to:: Private residence Living Arrangements: Spouse/significant other Available Help at Discharge: Family;Available 24 hours/day Type of Home: House Home Access: Stairs to enter [employer] of Steps: 1 Entrance Stairs-Rails: None Home Layout: One level     Bathroom Shower/Tub: Producer, television/film/video: Handicapped height Bathroom Accessibility: Yes   Home Equipment: Agricultural consultant (2 wheels);Rollator (4 wheels);BSC/3in1;Tub bench;Grab bars - toilet;Grab bars - tub/shower;Hand held shower head   Additional Comments: adjustable bed      Prior Functioning/Environment Prior Level of Function : Independent/Modified Independent             Mobility Comments: retired Charity fundraiser      OT Problem List: Decreased strength;Impaired balance (sitting and/or standing);Decreased activity tolerance;Decreased knowledge of use of DME or AE;Decreased knowledge of precautions;Pain   OT Treatment/Interventions: Self-care/ADL training;Therapeutic exercise;DME and/or AE instruction;Balance training;Patient/family education;Therapeutic activities      OT Goals(Current goals can be found in the care plan section)   Acute Rehab OT Goals Patient Stated Goal: get better OT Goal Formulation: With patient Time For Goal Achievement: 05/30/24 Potential to Achieve Goals: Good   OT Frequency:  Min 2X/week    Co-evaluation              AM-PAC OT 6 Clicks Daily Activity     Outcome Measure Help from another person eating meals?: None Help from another person taking care of personal grooming?: A  Little Help from another person toileting, which includes using toliet, bedpan, or urinal?: A Little Help from another person bathing (including washing, rinsing, drying)?: A Little Help from another person to put on and taking off regular upper body clothing?: A Little Help from another person to put on and taking off regular lower body clothing?: A Little 6 Click Score: 19   End of Session Equipment Utilized During Treatment: Gait belt;Rolling walker (2 wheels);Back brace Nurse Communication: Mobility status  Activity Tolerance: Patient tolerated treatment well Patient left: in bed;with call bell/phone within reach;with family/visitor present  OT Visit Diagnosis: Unsteadiness on feet (R26.81);Muscle weakness (generalized) (M62.81);Pain Pain - part of body:  (back)                Time: 1027-2536 OT Time Calculation (min): 10 min Charges:  OT General Charges $OT Visit: 1 Visit OT Evaluation $OT Eval Low Complexity: 1 Low  Karilyn Ouch, OTR/L [facility] Acute Rehabilitation Office: [phone]   Emery Hans 05/16/2024, 10:26 AM

## 2024-05-16 NOTE — Care Management Obs Status (Signed)
 MEDICARE OBSERVATION STATUS NOTIFICATION   Patient Details  Name: Alexis Fox MRN: [id number] Date of Birth: [birth year]   Medicare Observation Status Notification Given:  Yes    Felix Host 05/16/2024, 9:40 AM

## 2024-05-16 NOTE — Progress Notes (Signed)
 Patient ID: Alexis Fox, female   DOB: 05-14-1956, 68 y.o.   MRN: [id number] Vital signs are stable Motor function is intact in the lower extremities Patient complains of significant soreness She is mobilizing with great difficulty but proximal strength in lower extremities appears intact.  Dressing is clean and dry.  Will transfer patient to [address] for additional recuperation in the hospital.

## 2024-05-16 NOTE — Discharge Summary (Signed)
 Physician Discharge Summary  Patient ID: Alexis Fox MRN: [id number] DOB/AGE: Jul 23, 1956 68 y.o.  Admit date: 05/15/2024 Discharge date: 05/16/2024  Admission Diagnoses: Spinal stenosis L1-L2 history of fusion L2-L4.  Neurogenic claudication.  Lumbar radiculopathy.  Discharge Diagnoses: Spinal stenosis L1-L2.  Neurogenic claudication.  Lumbar radiculopathy.  History of fusion L2-L4. Principal Problem:   S/P lumbar fusion   Discharged Condition: good  Hospital Course: Patient tolerated surgery well  Consults: None  Significant Diagnostic Studies: None  Treatments: surgery: See op note  Discharge Exam: Blood pressure (!) 96/39, pulse 60, temperature 97.7 F (36.5 C), temperature source Oral, resp. rate 18, height 5' 4 (1.626 m), weight 97.5 kg, SpO2 98%. Incision is clean and dry Station and gait are intact.  Disposition: Discharge disposition: 01-Home or Self Care       Discharge Instructions     Call MD for:  redness, tenderness, or signs of infection (pain, swelling, redness, odor or green/yellow discharge around incision site)   Complete by: As directed    Call MD for:  severe uncontrolled pain   Complete by: As directed    Call MD for:  temperature >100.4   Complete by: As directed    Diet - low sodium heart healthy   Complete by: As directed    If the dressing is still on your incision site when you go home, remove it on the third day after your surgery date. Remove dressing if it begins to fall off, or if it is dirty or damaged before the third day.   Complete by: As directed    Increase activity slowly   Complete by: As directed       Allergies as of 05/16/2024       Reactions   Turmeric Diarrhea   Severe diarrhea   Other    Pt can not take steroids via IV or orally   Decadron  [dexamethasone ]    Exacerbates restless leg        Medication List     TAKE these medications    acetaminophen  500 MG tablet Commonly known as: TYLENOL  Take  1,000 mg by mouth every 8 (eight) hours as needed for moderate pain.   buPROPion  150 MG 24 hr tablet Commonly known as: WELLBUTRIN  XL Take 150 mg by mouth daily.   cetirizine 10 MG tablet Commonly known as: ZYRTEC Take 10 mg by mouth at bedtime.   citalopram  40 MG tablet Commonly known as: CELEXA  Take 40 mg by mouth daily.   clobetasol  ointment 0.05 % Commonly known as: TEMOVATE  Apply 1 application topically 2 (two) times daily.   clonazePAM  1 MG tablet Commonly known as: KLONOPIN  Take 1 tablet (1 mg total) by mouth at bedtime.   docusate sodium  100 MG capsule Commonly known as: COLACE Take 100 mg by mouth 2 (two) times daily.   fluticasone  50 MCG/ACT nasal spray Commonly known as: FLONASE  Place 1 spray into both nostrils daily.   gabapentin  300 MG capsule Commonly known as: NEURONTIN  Take 300 mg by mouth at bedtime.   ipratropium 0.06 % nasal spray Commonly known as: ATROVENT Place 2 sprays into both nostrils 4 (four) times daily.   methocarbamol  500 MG tablet Commonly known as: ROBAXIN  Take 1 tablet (500 mg total) by mouth every 6 (six) hours as needed for muscle spasms.   multivitamin with minerals tablet Take 1 tablet by mouth at bedtime.   NON FORMULARY Pt uses a c-pap nightly   oxyCODONE  5 MG immediate release tablet Commonly known  as: Oxy IR/ROXICODONE  Take 1-2 tablets (5-10 mg total) by mouth every 4 (four) hours as needed for moderate pain (pain score 4-6) or severe pain (pain score 7-10).   pramipexole  0.5 MG tablet Commonly known as: MIRAPEX  Take 1 tablet (0.5 mg total) by mouth daily as needed. TAKE 1 TABLET (0.5 MG TOTAL) BY MOUTH DAILY AS NEEDED (TAKE ALONG WITH EXTENDED RELEASE DOSE) Strength: 0.5 mg What changed:  when to take this additional instructions   Pramipexole  Dihydrochloride 0.75 MG Tb24 Commonly known as: Mirapex  ER Take 1 tablet (0.75 mg total) by mouth at bedtime. What changed: Another medication with the same name was  changed. Make sure you understand how and when to take each.   Vitamin D 50 MCG (2000 UT) tablet Take 2,000 Units by mouth 2 (two) times daily.               Durable Medical Equipment  (From admission, onward)           Start     Ordered   05/15/24 1949  DME Walker rolling  Once       Question:  Patient needs a walker to treat with the following condition  Answer:  S/P lumbar fusion   05/15/24 1948   05/15/24 1949  DME 3 n 1  Once        05/15/24 1948              Discharge Care Instructions  (From admission, onward)           Start     Ordered   05/16/24 0000  If the dressing is still on your incision site when you go home, remove it on the third day after your surgery date. Remove dressing if it begins to fall off, or if it is dirty or damaged before the third day.        05/16/24 1342             Signed: Kevin Pellant Kairos Panetta 05/16/2024, 1:42 PM

## 2024-05-16 NOTE — Evaluation (Signed)
 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 PMH: breast ca, previous back fusion, ACDF C2-3, R TKA  Clinical Impression  Pt admitted with above. Pt alert but sleepy and desire to return to bed but agreeable to work with PT. Pt educated on back precautions, to use RW not rollator at this time, and to minimize static sitting > 30-66min. Pt with good support and home set up with all equipment needed. Acute PT to cont follow to progress amb distance and complete stair negotiation. Pt amb limited this date by pain and fatigue.      If plan is discharge home, recommend the following: A little help with walking and/or transfers;A little help with bathing/dressing/bathroom;Assist for transportation;Help with stairs or ramp for entrance   Can travel by private vehicle        Equipment Recommendations None recommended by PT  Recommendations for Other Services       Functional Status Assessment Patient has had a recent decline in their functional status and demonstrates the ability to make significant improvements in function in a reasonable and predictable amount of time.     Precautions / Restrictions Precautions Precautions: Back Precaution Booklet Issued: Yes (comment) Recall of Precautions/Restrictions: Intact Precaution/Restrictions Comments: recalled 2/3 from previous surgery Restrictions Weight Bearing Restrictions Per Provider Order: No      Mobility  Bed Mobility               General bed mobility comments: pt received sitting EOB with RN    Transfers Overall transfer level: Needs assistance Equipment used: Rolling walker (2 wheels) Transfers: Sit to/from Stand Sit to Stand: Supervision           General transfer comment: verbal cues to not pull up on RW     Ambulation/Gait Ambulation/Gait assistance: Contact guard assist Gait Distance (Feet): 50 Feet Assistive device: Rolling walker (2 wheels) Gait Pattern/deviations: Step-through pattern, Decreased stride length Gait velocity: dec Gait velocity interpretation: <1.31 ft/sec, indicative of household ambulator   General Gait Details: good upright posture, guarded, cautious, limited by fatigue and pain as reported by patient  Stairs            Wheelchair Mobility     Tilt Bed    Modified Rankin (Stroke Patients Only)       Balance Overall balance assessment: Mild deficits observed, not formally tested                                           Pertinent Vitals/Pain Pain Assessment Pain Assessment: 0-10 Pain Score: 4  Pain Location: back Pain Descriptors / Indicators: Constant Pain Intervention(s): RN gave pain meds during session    Home Living Family/patient expects to be discharged to:: Private residence Living Arrangements: Spouse/significant other Available Help at Discharge: Family;Available 24 hours/day Type of Home: House Home Access: Stairs to enter Entrance Stairs-Rails: None Entrance Stairs-Number of Steps: 1   Home Layout: One level Home Equipment: Agricultural consultant (2 wheels);Rollator (4 wheels);BSC/3in1;Tub bench;Grab bars - toilet;Grab bars - tub/shower;Hand held shower head Additional Comments: adjustable bed    Prior Function Prior Level of Function : Independent/Modified Independent             Mobility Comments:  retired Charity fundraiser       Extremity/Trunk Assessment   Upper Extremity Assessment Upper Extremity Assessment: Defer to OT evaluation    Lower Extremity Assessment Lower Extremity Assessment: Generalized weakness    Cervical / Trunk Assessment Cervical / Trunk Assessment: Back Surgery  Communication   Communication Communication: No apparent difficulties    Cognition Arousal: Lethargic (A&0x4 however sleepy  and difficulty keeping eyes open, states hard to hold my head up) Behavior During Therapy: [facility] for tasks assessed/performed   PT - Cognitive impairments: No apparent impairments                         Following commands: Intact       Cueing Cueing Techniques: Verbal cues     General Comments General comments (skin integrity, edema, etc.): pt able to done back brace, BP 90s/70s    Exercises     Assessment/Plan    PT Assessment Patient needs continued PT services  PT Problem List Decreased strength;Decreased range of motion;Decreased activity tolerance;Decreased balance;Decreased mobility;Decreased knowledge of use of DME       PT Treatment Interventions DME instruction;Gait training;Stair training;Functional mobility training;Therapeutic activities;Therapeutic exercise    PT Goals (Current goals can be found in the Care Plan section)  Acute Rehab PT Goals Patient Stated Goal: home PT Goal Formulation: With patient Time For Goal Achievement: 05/23/24 Potential to Achieve Goals: Good    Frequency Min 5X/week     Co-evaluation               AM-PAC PT 6 Clicks Mobility  Outcome Measure Help needed turning from your back to your side while in a flat bed without using bedrails?: A Little Help needed moving from lying on your back to sitting on the side of a flat bed without using bedrails?: A Little Help needed moving to and from a bed to a chair (including a wheelchair)?: A Little Help needed standing up from a chair using your arms (e.g., wheelchair or bedside chair)?: A Little Help needed to walk in hospital room?: A Little Help needed climbing 3-5 steps with a railing? : A Little 6 Click Score: 18    End of Session Equipment Utilized During Treatment: Back brace Activity Tolerance: Patient limited by fatigue;Patient limited by pain Patient left: in bed (sitting EOB with OT) Nurse Communication: Mobility status PT Visit Diagnosis: Unsteadiness on  feet (R26.81);Muscle weakness (generalized) (M62.81);Difficulty in walking, not elsewhere classified (R26.2)    Time: 1308-6578 PT Time Calculation (min) (ACUTE ONLY): 14 min   Charges:   PT Evaluation $PT Eval Low Complexity: 1 Low   PT General Charges $$ ACUTE PT VISIT: 1 Visit         Renaee Caro, PT, DPT Acute Rehabilitation Services Secure chat preferred Office #: [phone]   Jenna Moan 05/16/2024, 9:32 AM

## 2024-05-16 NOTE — TOC Transition Note (Signed)
 Transition of Care [facility]) - Discharge Note   Patient Details  Name: Alexis Fox MRN: [id number] Date of Birth: May 12, 1956  Transition of [facility]) CM/SW Contact:  Jannine Meo, RN Phone Number: 05/16/2024, 2:37 PM   Clinical Narrative:   Patient is being discharged today. Orders for RW and 3:1 noted, patient declined DME at this time.    Final next level of care: Home/Self Care Barriers to Discharge: No Barriers Identified   Patient Goals and CMS Choice            Discharge Placement                       Discharge Plan and Services Additional resources added to the After Visit Summary for                  DME Arranged: 3-N-1, Walker rolling DME Agency: [employer] Date DME Agency Contacted: 05/16/24 Time DME Agency Contacted: 1434 Representative spoke with at DME Agency: [employer] weekend liaison            Social Drivers of Health (SDOH) Interventions SDOH Screenings   Food Insecurity: No Food Insecurity (05/16/2024)  Housing: Low Risk  (05/16/2024)  Transportation Needs: No Transportation Needs (05/16/2024)  Utilities: Not At Risk (05/16/2024)  Financial Resource Strain: Low Risk  (10/24/2018)  Physical Activity: Unknown (10/24/2018)  Stress: No Stress Concern Present (10/24/2018)  Tobacco Use: Low Risk  (05/15/2024)     Readmission Risk Interventions     No data to display

## 2024-05-26 DIAGNOSIS — K5900 Constipation, unspecified: Secondary | ICD-10-CM | POA: Diagnosis not present

## 2024-05-26 DIAGNOSIS — Z981 Arthrodesis status: Secondary | ICD-10-CM | POA: Diagnosis not present

## 2024-06-23 ENCOUNTER — Other Ambulatory Visit: Payer: Self-pay | Admitting: Internal Medicine

## 2024-06-23 DIAGNOSIS — Z1231 Encounter for screening mammogram for malignant neoplasm of breast: Secondary | ICD-10-CM

## 2024-06-30 DIAGNOSIS — M5416 Radiculopathy, lumbar region: Secondary | ICD-10-CM | POA: Diagnosis not present

## 2024-07-02 ENCOUNTER — Ambulatory Visit: Admitting: Adult Health

## 2024-07-22 ENCOUNTER — Ambulatory Visit
Admission: RE | Admit: 2024-07-22 | Discharge: 2024-07-22 | Disposition: A | Discharge status: Home / Self Care | Source: Ambulatory Visit | Attending: Internal Medicine | Admitting: Internal Medicine

## 2024-07-22 DIAGNOSIS — Z1231 Encounter for screening mammogram for malignant neoplasm of breast: Secondary | ICD-10-CM | POA: Diagnosis not present

## 2024-07-27 ENCOUNTER — Other Ambulatory Visit: Payer: Self-pay | Admitting: Internal Medicine

## 2024-07-27 DIAGNOSIS — R928 Other abnormal and inconclusive findings on diagnostic imaging of breast: Secondary | ICD-10-CM

## 2024-07-28 ENCOUNTER — Other Ambulatory Visit: Payer: Self-pay | Admitting: Internal Medicine

## 2024-07-28 ENCOUNTER — Ambulatory Visit
Admission: RE | Admit: 2024-07-28 | Discharge: 2024-07-28 | Disposition: A | Discharge status: Home / Self Care | Source: Ambulatory Visit | Attending: Internal Medicine | Admitting: Internal Medicine

## 2024-07-28 DIAGNOSIS — R928 Other abnormal and inconclusive findings on diagnostic imaging of breast: Secondary | ICD-10-CM

## 2024-07-28 DIAGNOSIS — N6320 Unspecified lump in the left breast, unspecified quadrant: Secondary | ICD-10-CM

## 2024-07-28 DIAGNOSIS — N6489 Other specified disorders of breast: Secondary | ICD-10-CM | POA: Diagnosis not present

## 2024-07-31 ENCOUNTER — Encounter

## 2024-07-31 ENCOUNTER — Ambulatory Visit
Admission: RE | Admit: 2024-07-31 | Discharge: 2024-07-31 | Disposition: A | Discharge status: Home / Self Care | Source: Ambulatory Visit | Attending: Internal Medicine | Admitting: Internal Medicine

## 2024-07-31 ENCOUNTER — Other Ambulatory Visit

## 2024-07-31 DIAGNOSIS — R590 Localized enlarged lymph nodes: Secondary | ICD-10-CM | POA: Diagnosis not present

## 2024-07-31 DIAGNOSIS — N6320 Unspecified lump in the left breast, unspecified quadrant: Secondary | ICD-10-CM | POA: Diagnosis not present

## 2024-07-31 HISTORY — PX: BREAST BIOPSY: SHX20

## 2024-08-04 LAB — SURGICAL PATHOLOGY

## 2024-08-09 ENCOUNTER — Other Ambulatory Visit: Payer: Self-pay | Admitting: Adult Health

## 2024-08-10 DIAGNOSIS — J069 Acute upper respiratory infection, unspecified: Secondary | ICD-10-CM | POA: Diagnosis not present

## 2024-08-10 DIAGNOSIS — M533 Sacrococcygeal disorders, not elsewhere classified: Secondary | ICD-10-CM | POA: Diagnosis not present

## 2024-08-20 ENCOUNTER — Other Ambulatory Visit (HOSPITAL_COMMUNITY): Payer: Self-pay | Admitting: Orthopedic Surgery

## 2024-08-20 DIAGNOSIS — M170 Bilateral primary osteoarthritis of knee: Secondary | ICD-10-CM | POA: Diagnosis not present

## 2024-08-20 DIAGNOSIS — Z96651 Presence of right artificial knee joint: Secondary | ICD-10-CM

## 2024-08-20 DIAGNOSIS — M1711 Unilateral primary osteoarthritis, right knee: Secondary | ICD-10-CM | POA: Diagnosis not present

## 2024-08-20 DIAGNOSIS — M1712 Unilateral primary osteoarthritis, left knee: Secondary | ICD-10-CM | POA: Diagnosis not present

## 2024-08-25 ENCOUNTER — Encounter: Payer: Self-pay | Admitting: Hematology and Oncology

## 2024-09-04 DIAGNOSIS — Z981 Arthrodesis status: Secondary | ICD-10-CM | POA: Diagnosis not present

## 2024-09-04 DIAGNOSIS — F5101 Primary insomnia: Secondary | ICD-10-CM | POA: Diagnosis not present

## 2024-09-04 DIAGNOSIS — J9809 Other diseases of bronchus, not elsewhere classified: Secondary | ICD-10-CM | POA: Diagnosis not present

## 2024-09-04 DIAGNOSIS — R053 Chronic cough: Secondary | ICD-10-CM | POA: Diagnosis not present

## 2024-09-09 ENCOUNTER — Ambulatory Visit (HOSPITAL_COMMUNITY)
Admission: RE | Admit: 2024-09-09 | Discharge: 2024-09-09 | Disposition: A | Discharge status: Home / Self Care | Source: Ambulatory Visit | Attending: Orthopedic Surgery | Admitting: Orthopedic Surgery

## 2024-09-09 ENCOUNTER — Encounter (HOSPITAL_COMMUNITY): Payer: Self-pay

## 2024-09-09 DIAGNOSIS — Z96651 Presence of right artificial knee joint: Secondary | ICD-10-CM | POA: Diagnosis not present

## 2024-09-09 DIAGNOSIS — M25561 Pain in right knee: Secondary | ICD-10-CM | POA: Diagnosis not present

## 2024-09-09 DIAGNOSIS — M25661 Stiffness of right knee, not elsewhere classified: Secondary | ICD-10-CM | POA: Diagnosis not present

## 2024-09-09 DIAGNOSIS — Z96641 Presence of right artificial hip joint: Secondary | ICD-10-CM | POA: Diagnosis not present

## 2024-09-09 DIAGNOSIS — M6281 Muscle weakness (generalized): Secondary | ICD-10-CM | POA: Diagnosis not present

## 2024-09-09 MED ORDER — TECHNETIUM TC 99M MEDRONATE IV KIT
20.2000 | PACK | Freq: Once | INTRAVENOUS | Status: AC
  Administered 2024-09-09: 20.2 via INTRAVENOUS

## 2024-09-14 DIAGNOSIS — M25561 Pain in right knee: Secondary | ICD-10-CM | POA: Diagnosis not present

## 2024-09-14 DIAGNOSIS — M25661 Stiffness of right knee, not elsewhere classified: Secondary | ICD-10-CM | POA: Diagnosis not present

## 2024-09-14 DIAGNOSIS — Z96651 Presence of right artificial knee joint: Secondary | ICD-10-CM | POA: Diagnosis not present

## 2024-09-14 DIAGNOSIS — M6281 Muscle weakness (generalized): Secondary | ICD-10-CM | POA: Diagnosis not present

## 2024-09-14 DIAGNOSIS — J3089 Other allergic rhinitis: Secondary | ICD-10-CM | POA: Diagnosis not present

## 2024-09-23 ENCOUNTER — Other Ambulatory Visit: Payer: Self-pay | Admitting: Adult Health

## 2024-09-23 ENCOUNTER — Ambulatory Visit

## 2024-09-23 VITALS — BP 114/74 | HR 67 | Temp 98.5°F | Ht 64.0 in | Wt 219.8 lb

## 2024-09-23 DIAGNOSIS — K219 Gastro-esophageal reflux disease without esophagitis: Secondary | ICD-10-CM

## 2024-09-23 DIAGNOSIS — J309 Allergic rhinitis, unspecified: Secondary | ICD-10-CM | POA: Diagnosis not present

## 2024-09-23 DIAGNOSIS — R053 Chronic cough: Secondary | ICD-10-CM | POA: Diagnosis not present

## 2024-09-23 LAB — CBC WITH DIFFERENTIAL/PLATELET
Basophils Absolute: 0.1 K/uL (ref 0.0–0.1)
Basophils Relative: 1.1 % (ref 0.0–3.0)
Eosinophils Absolute: 0.4 K/uL (ref 0.0–0.7)
Eosinophils Relative: 7.3 % — ABNORMAL HIGH (ref 0.0–5.0)
HCT: 39.3 % (ref 36.0–46.0)
Hemoglobin: 12.6 g/dL (ref 12.0–15.0)
Lymphocytes Relative: 28.6 % (ref 12.0–46.0)
Lymphs Abs: 1.7 K/uL (ref 0.7–4.0)
MCHC: 32.1 g/dL (ref 30.0–36.0)
MCV: 90.9 fl (ref 78.0–100.0)
Monocytes Absolute: 0.5 K/uL (ref 0.1–1.0)
Monocytes Relative: 7.7 % (ref 3.0–12.0)
Neutro Abs: 3.2 K/uL (ref 1.4–7.7)
Neutrophils Relative %: 55.3 % (ref 43.0–77.0)
Platelets: 245 K/uL (ref 150.0–400.0)
RBC: 4.32 Mil/uL (ref 3.87–5.11)
RDW: 14.1 % (ref 11.5–15.5)
WBC: 5.8 K/uL (ref 4.0–10.5)

## 2024-09-23 MED ORDER — IPRATROPIUM BROMIDE 0.06 % NA SOLN
2.0000 | Freq: Four times a day (QID) | NASAL | 2 refills | Status: AC
Start: 2024-09-23 — End: ?

## 2024-09-23 MED ORDER — TRAMADOL HCL 50 MG PO TABS: 50.0000 mg | ORAL_TABLET | Freq: Four times a day (QID) | ORAL | 0 refills | Status: AC | PRN

## 2024-09-23 MED ORDER — CHLORPHENIRAMINE MALEATE 4 MG PO TABS: 4.0000 mg | ORAL_TABLET | Freq: Every day | ORAL | 0 refills | Status: AC

## 2024-09-23 NOTE — Patient Instructions (Addendum)
  VISIT SUMMARY: You visited today due to a chronic cough that has persisted since April. Despite various treatments for allergies and upper respiratory issues, the cough continues. Your chest x-rays have been clear, and you have been managing acid reflux with pantoprazole . You also use Flonase  and ipratropium nasal spray, but these have not fully stopped the sinus drainage. You have an upcoming appointment with an allergist in two weeks.  YOUR PLAN: -CHRONIC COUGH DUE TO GASTROESOPHAGEAL REFLUX DISEASE (GERD) AND ALLERGIC RHINITIS: Your chronic cough is likely due to GERD and allergic rhinitis. GERD is a condition where stomach acid frequently flows back into the tube connecting your mouth and stomach, causing irritation. Allergic rhinitis is an allergic reaction that causes sneezing, congestion, and a runny nose. You should follow a strict GERD diet, avoid eating meals 3 hours before bedtime, and eat smaller meals. Continue using Flonase  daily and ipratropium nasal spray as needed to control postnasal drip. We will also conduct a full allergy panel blood test to identify potential allergens and order pulmonary function tests to assess your lung function. Tramadol  is prescribed as needed for cough suppression, and chlorphenamine is recommended at bedtime for additional antihistamine effect. If the Advair inhaler is not beneficial, you should discontinue its use. We will monitor for potential sarcoidosis, and if there is no improvement in two months, a CT scan of your chest may be considered.  INSTRUCTIONS: Please follow up with the allergist as scheduled in two weeks. Additionally, adhere to the GERD diet and medication regimen as discussed. We will review the results of the allergy panel and pulmonary function tests once they are completed. If your symptoms do not improve in two months, we may need to consider a CT scan of your chest.    Below is the information on a GERD diet   Foods to Avoid:   Acidic foods: Citrus fruits, tomatoes, tomato-based products, vinegar, garlic, onions  Fatty foods: Fried foods, fatty meats, whole milk, cheese  Spicy foods: Chili peppers, black pepper, mustard  Chocolate: Contains caffeine  and theobromine, which relax the lower esophageal sphincter (LES)  Alcohol: Relaxes the LES and increases stomach acid production  Caffeine : Found in coffee, tea, and some sodas, it can stimulate stomach acid production    Foods to Eat:  Lean proteins: Chicken, fish, eggs Non-acidic fruits: Apples, bananas, pears, grapes Vegetables: Steamed, roasted, or boiled vegetables (e.g., broccoli, carrots, green beans) Whole grains: Brown rice, quinoa, oatmeal Low-fat dairy: Skim milk, low-fat yogurt, low-fat cheese Water : Staying hydrated helps dilute stomach acid    Other Dietary Recommendations: Eat smaller, more frequent meals. Avoid eating within 2-3 hours of bedtime. Chew food thoroughly. Limit sugary drinks and processed foods. Consider a Mediterranean-style diet, which is rich in fruits, vegetables, and whole grains.                    Contains text generated by Abridge.                                 Contains text generated by Abridge.

## 2024-09-23 NOTE — Progress Notes (Signed)
 Subjective:   PATIENT ID: Alexis Fox GENDER: female DOB: 1956/02/09, MRN: [id number]   HPI Discussed the use of AI scribe software for clinical note transcription with the patient, who gave verbal consent to proceed.  History of Present Illness Alexis Fox is a 68 year old female who presents with a chronic cough since April.  She has experienced a recurrent cough since April, described as chronic and primarily located in the upper respiratory tract. Despite treatment for allergies and upper respiratory issues, including in May, July, and September, the cough persists. Chest x-rays have been clear. She experiences clear white sputum production and postnasal drip.  She is currently being treated for acid reflux with pantoprazole  (Protonix ) and uses Flonase  and ipratropium nasal spray during acute periods, although these treatments are not fully effective in stopping sinus drainage. She uses the Advair inhaler twice daily but does not find it beneficial. No wheezing. She has an upcoming appointment with an allergist in two weeks. She has been on various allergy pills for many years, including Claritin  and Zyrtec, and is currently on a new one prescribed through [facility].  She denies a history of childhood asthma and smoking. She has a dog and a cat at home, which she has had since childhood. 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 She uses a CPAP machine for sleep apnea and has restless leg syndrome. 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  She has used warfenacin mixed with codeine in the past for her cough, primarily at night to aid sleep, but has not needed it since last Friday. She is cautious about using medications that may impair her during the day due to her responsibilities as a Engineer, civil (consulting).     Past Medical History:   Diagnosis Date   Anxiety    Arthritis    knees   Breast cancer (HCC)    Breast cancer of upper-outer quadrant of left female breast (HCC) 03/20/2016   C2 cervical fracture (HCC)    10/22/18, following fall from ladder   Cervicalgia    Cholecystitis    Complication of anesthesia    BP crashes post op   Early cataracts, bilateral    Family history of brain cancer    Hot flashes    Personal history of chemotherapy    Personal history of radiation therapy    Restless leg syndrome    Sleep apnea 10/03/2018   wears CPAP     Family History  Problem Relation Age of Onset   Heart disease Mother    Pulmonary fibrosis Mother    Hypertension Mother    Sleep apnea Mother    Cancer Father 75       astocytoma   Lymphoma Maternal Aunt    Hypertension Brother    Anesthesia problems Neg Hx    Breast cancer Neg Hx    BRCA 1/2 Neg Hx      Social History   Socioeconomic History   Marital status: Married    Spouse name: Not on file   Number of children: 2   Years of education: Not on file   Highest education level: Not on file  Occupational History   Not on file  Tobacco Use   Smoking status: Never   Smokeless tobacco: Never  Vaping Use   Vaping status: Never  Used  Substance and Sexual Activity   Alcohol use: Yes    Alcohol/week: 1.0 standard drink of alcohol    Types: 1 Cans of beer per week    Comment: Rarely 1/month   Drug use: No   Sexual activity: Not Currently    Birth control/protection: Post-menopausal  Other Topics Concern   Not on file  Social History Narrative   Not on file   Social Drivers of Health   Financial Resource Strain: Low Risk  (10/24/2018)   Overall Financial Resource Strain (CARDIA)    Difficulty of Paying Living Expenses: Not hard at all  Food Insecurity: No Food Insecurity (05/16/2024)   Hunger Vital Sign    Worried About Running Out of Food in the Last Year: Never true    Ran Out of Food in the Last Year: Never true  Transportation  Needs: No Transportation Needs (05/16/2024)   PRAPARE - Administrator, Civil Service (Medical): No    Lack of Transportation (Non-Medical): No  Physical Activity: Unknown (10/24/2018)   Exercise Vital Sign    Days of Exercise per Week: 0 days    Minutes of Exercise per Session: Not on file  Stress: No Stress Concern Present (10/24/2018)   [employer] of Occupational Health - Occupational Stress Questionnaire    Feeling of Stress : Not at all  Social Connections: Socially Integrated (05/16/2024)   Social Connection and Isolation Panel    Frequency of Communication with Friends and Family: Three times a week    Frequency of Social Gatherings with Friends and Family: Three times a week    Attends Religious Services: More than 4 times per year    Active Member of Clubs or Organizations: Not on file    Attends Club or Organization Meetings: More than 4 times per year    Marital Status: Married  Catering manager Violence: Not At Risk (05/16/2024)   Humiliation, Afraid, Rape, and Kick questionnaire    Fear of Current or Ex-Partner: No    Emotionally Abused: No    Physically Abused: No    Sexually Abused: No     Allergies  Allergen Reactions   Turmeric Diarrhea    Severe diarrhea   Other     Pt can not take steroids via IV or orally   Decadron  [Dexamethasone ]     Exacerbates restless leg     Outpatient Medications Prior to Visit  Medication Sig Dispense Refill   acetaminophen  (TYLENOL ) 500 MG tablet Take 1,000 mg by mouth every 8 (eight) hours as needed for moderate pain.     buPROPion  (WELLBUTRIN  XL) 150 MG 24 hr tablet Take 150 mg by mouth daily.  4   cetirizine (ZYRTEC) 10 MG tablet Take 10 mg by mouth at bedtime.     Cholecalciferol (VITAMIN D) 50 MCG (2000 UT) tablet Take 2,000 Units by mouth 2 (two) times daily.     citalopram  (CELEXA ) 40 MG tablet Take 40 mg by mouth daily.  3   clonazePAM  (KLONOPIN ) 1 MG tablet Take 1 tablet (1 mg total) by mouth at bedtime.  90 tablet 1   docusate sodium  (COLACE) 100 MG capsule Take 100 mg by mouth 2 (two) times daily.     fluticasone  (FLONASE ) 50 MCG/ACT nasal spray Place 1 spray into both nostrils daily.     gabapentin  (NEURONTIN ) 300 MG capsule Take 300 mg by mouth at bedtime.     Multiple Vitamins-Minerals (MULTIVITAMIN WITH MINERALS) tablet Take 1 tablet by mouth at  bedtime.     NON FORMULARY Pt uses a c-pap nightly     pramipexole  (MIRAPEX ) 0.5 MG tablet TAKE 1 TABLET EVERY DAY AS NEEDED (TAKE IN ADDITION TO EXTENDED RELEASE DOSE) 30 tablet 1   Pramipexole  Dihydrochloride 0.75 MG TB24 TAKE 1 TABLET AT BEDTIME 30 tablet 1   ipratropium (ATROVENT ) 0.06 % nasal spray Place 2 sprays into both nostrils 4 (four) times daily.     clobetasol  ointment (TEMOVATE ) 0.05 % Apply 1 application topically 2 (two) times daily. (Patient not taking: Reported on 09/23/2024) 30 g 1   methocarbamol  (ROBAXIN ) 500 MG tablet Take 1 tablet (500 mg total) by mouth every 6 (six) hours as needed for muscle spasms. 30 tablet 3   oxyCODONE  (OXY IR/ROXICODONE ) 5 MG immediate release tablet Take 1-2 tablets (5-10 mg total) by mouth every 4 (four) hours as needed for moderate pain (pain score 4-6) or severe pain (pain score 7-10). 60 tablet 0   Facility-Administered Medications Prior to Visit  Medication Dose Route Frequency Provider Last Rate Last Admin   sodium chloride  flush (NS) 0.9 % injection 10 mL  10 mL Intracatheter PRN Odean Potts, MD   10 mL at 07/19/16 1434    ROS Reviewed all systems and reported negative except as above     Objective:   Vitals:   09/23/24 1126  BP: 114/74  Pulse: 67  Temp: 98.5 F (36.9 C)  TempSrc: Oral  SpO2: 95%  Weight: 219 lb 12.8 oz (99.7 kg)  Height: 5' 4 (1.626 m)    Physical Exam Constitutional:      Appearance: Normal appearance.  HENT:     Head: Normocephalic.     Nose: Nose normal.     Mouth/Throat:     Mouth: Mucous membranes are moist.  Cardiovascular:     Rate and  Rhythm: Normal rate and regular rhythm.  Pulmonary:     Effort: Pulmonary effort is normal.  Abdominal:     General: Abdomen is flat.  Skin:    General: Skin is warm.     Capillary Refill: Capillary refill takes less than 2 seconds.  Neurological:     General: No focal deficit present.     Mental Status: She is alert.    Physical Exam CHEST: Lungs clear to auscultation, no wheezing, no crackles. CARDIOVASCULAR: Heart sounds normal, no murmurs. EXTREMITIES: No edema in extremities.     CBC    Component Value Date/Time   WBC 7.6 05/07/2024 1337   RBC 4.14 05/07/2024 1337   HGB 12.4 05/07/2024 1337   HGB 13.0 05/27/2017 1126   HCT 38.8 05/07/2024 1337   HCT 39.4 05/27/2017 1126   PLT 232 05/07/2024 1337   PLT 186 05/27/2017 1126   MCV 93.7 05/07/2024 1337   MCV 106.8 (H) 05/27/2017 1126   MCH 30.0 05/07/2024 1337   MCHC 32.0 05/07/2024 1337   RDW 14.0 05/07/2024 1337   RDW 21.0 (H) 05/27/2017 1126   LYMPHSABS 1.7 05/10/2020 1344   LYMPHSABS 1.0 05/27/2017 1126   MONOABS 0.6 05/10/2020 1344   MONOABS 0.6 05/27/2017 1126   EOSABS 0.6 (H) 05/10/2020 1344   EOSABS 0.2 05/27/2017 1126   BASOSABS 0.0 05/10/2020 1344   BASOSABS 0.1 05/27/2017 1126     Chest imaging:  PFT:     No data to display          Labs:    Echo:       Assessment & Plan:   Assessment and Plan Assessment &  Plan Chronic cough due to gastroesophageal reflux disease and allergic rhinitis Chronic cough likely due to GERD and allergic rhinitis. GERD managed with pantoprazole ; lifestyle changes needed. Allergic rhinitis treated with Flonase  and ipratropium, but symptoms persist. Differential includes sarcoidosis due to calcified granulomas on chest x-ray. No asthma or smoking history. Potential allergies to pets or environmental factors to be evaluated. - Implement strict GERD diet, avoid meals 3 hours before sleep, eat small meals. - Use Flonase  daily, ipratropium nasal spray as needed  to control postnasal drip without causing epistaxis. - Order full allergy panel blood test to identify potential allergens. - Order pulmonary function tests (PFTs) to assess lung function. - Prescribe tramadol  as needed for cough suppression. - Recommend chlorphenamine at bedtime for additional antihistamine effect. - Discontinue Advair inhaler if no benefit observed. - Monitor for potential sarcoidosis; if no improvement in two months, consider CT scan of chest.        Zola Herter, MD [facility] Office: [phone]

## 2024-09-24 ENCOUNTER — Ambulatory Visit: Admitting: Adult Health

## 2024-09-24 LAB — RESPIRATORY ALLERGY PANEL REGION II W/ RFLX: ~~LOC~~

## 2024-09-24 LAB — INTERPRETATION:

## 2024-09-24 NOTE — Telephone Encounter (Signed)
 LOV 02/13/24  Next OV 05/26/25  Last refill 03/13/24, #90, 1 refills  Please review, thanks!

## 2024-09-26 ENCOUNTER — Other Ambulatory Visit: Payer: Self-pay | Admitting: Adult Health

## 2024-09-29 DIAGNOSIS — T8484XD Pain due to internal orthopedic prosthetic devices, implants and grafts, subsequent encounter: Secondary | ICD-10-CM | POA: Diagnosis not present

## 2024-09-29 DIAGNOSIS — M5416 Radiculopathy, lumbar region: Secondary | ICD-10-CM | POA: Diagnosis not present

## 2024-09-30 ENCOUNTER — Encounter: Payer: Self-pay | Admitting: Allergy and Immunology

## 2024-09-30 ENCOUNTER — Ambulatory Visit: Admitting: Allergy and Immunology

## 2024-09-30 VITALS — BP 124/76 | HR 72 | Resp 16 | Ht 64.0 in | Wt 215.0 lb

## 2024-09-30 DIAGNOSIS — K219 Gastro-esophageal reflux disease without esophagitis: Secondary | ICD-10-CM | POA: Diagnosis not present

## 2024-09-30 DIAGNOSIS — J3089 Other allergic rhinitis: Secondary | ICD-10-CM

## 2024-09-30 DIAGNOSIS — R053 Chronic cough: Secondary | ICD-10-CM

## 2024-09-30 MED ORDER — PANTOPRAZOLE SODIUM 40 MG PO TBEC: 40.0000 mg | DELAYED_RELEASE_TABLET | Freq: Two times a day (BID) | ORAL | 1 refills | Status: DC

## 2024-09-30 MED ORDER — FAMOTIDINE 40 MG PO TABS: 40.0000 mg | ORAL_TABLET | Freq: Every day | ORAL | 1 refills | Status: AC

## 2024-09-30 NOTE — Patient Instructions (Addendum)
  1. Treat and prevent LPR:   A. Pantoprazole  40 mg - 1 tab 2 times per day  B. Famotidine 40 mg - 1 tab in evening  C. Replace throat clearing with swallowing / drinking maneuver  2. Evaluation of throat with ENT  3. Can continue Flonase  and Ipratropium  4. Influenza = Tamiflu. Covid = Paxlovid  5. Return to clinic in 4 weeks or earlier if problem

## 2024-09-30 NOTE — Progress Notes (Unsigned)
 [place] - [place] - [place] - [place] - [place]   Dear Alexis,  Thank you for referring Alexis Fox to the [facility] [place]  on 09/30/2024.   Below is a summation of this patient's evaluation and recommendations.  Thank you for your referral. I will keep you informed about this patient's response to treatment.   If you have any questions please do not hesitate to contact me.   Sincerely,  Alexis DOROTHA Denis, MD Allergy / Immunology [facility] [place]    ______________________________________________________________________    NEW PATIENT NOTE  Referring Provider: Dwight Trula SQUIBB, MD Primary Provider: Dwight Trula SQUIBB, MD Date of office visit: 09/30/2024    Subjective:   Chief Complaint:  Alexis Fox (DOB: 1956/04/20) is a 68 y.o. female who presents to the clinic on 09/30/2024 with a chief complaint of Cough .     HPI: Alexis Fox presents to this clinic in evaluation of cough.  She has been coughing since February 2025.  She has a cyclical type of cough where she will cough for 3 weeks straight and then she may get 6 weeks hiatus and then redevelops her cough.  She has spells of cough that she cannot control.  She cannot speak during the spells and her voice gets very raspy.  She has to constantly clear her throat and she feels as though there is a coating in her throat.  She does not have any vomiting.  This definitely disturbs her sleep.  She cannot use her CPAP machine because of this cough.  She has no associated chest tightness or shortness of breath or wheezing or significant upper airway symptoms.  She does not have choking or swallowing problems.  She does not have anosmia, ugly nasal discharge, headaches.  She uses nasal fluticasone  and nasal ipratropium which is a combination she has used for a prolonged period in time.  She has relied on the use of narcotics to suppress her  cough so she can sleep at night.  She has had a supply of oxycodone  that she has used in the past.  She is use some codeine-based cough medications.  She has been treated for reflux with Protonix  once a day.  She has been treated with inhalers.  She has had lisinopril removed from her medical regime.  She has seen pulmonology for this issue.  She has had fusion of her cervical vertebrae in 2019.  Past Medical History:  Diagnosis Date   Anxiety    Arthritis    knees   Breast cancer (HCC)    Breast cancer of upper-outer quadrant of left female breast (HCC) 03/20/2016   C2 cervical fracture (HCC)    10/22/18, following fall from ladder   Cervicalgia    Cholecystitis    Complication of anesthesia    BP crashes post op   Early cataracts, bilateral    Family history of brain cancer    Hot flashes    Personal history of chemotherapy    Personal history of radiation therapy    Restless leg syndrome    Sleep apnea 10/03/2018   wears CPAP    Past Surgical History:  Procedure Laterality Date   ANTERIOR CERVICAL DECOMP/DISCECTOMY FUSION N/A 01/22/2020   Procedure: ACDF - C2-C3;  Surgeon: Joshua Alm RAMAN, MD;  Location: [facility] OR;  Service: Neurosurgery;  Laterality: N/A;   BACK SURGERY     x2   BREAST BIOPSY  BREAST BIOPSY Left 07/31/2024   US  LT BREAST BX W LOC DEV 1ST LESION IMG BX SPEC US  GUIDE 07/31/2024 [facility] MAMMOGRAPHY   BREAST LUMPECTOMY Left 2018   BUNIONECTOMY     CHOLECYSTECTOMY N/A 10/04/2015   Procedure: LAPAROSCOPIC CHOLECYSTECTOMY WITH INTRAOPERATIVE CHOLANGIOGRAM;  Surgeon: Alexis Blush, MD;  Location: [facility] OR;  Service: General;  Laterality: N/A;   ELBOW SURGERY     right for epicondylitis 2010    ESOPHAGEAL MANOMETRY N/A 11/30/2020   Procedure: ESOPHAGEAL MANOMETRY (EM);  Surgeon: Saintclair Jasper, MD;  Location: [facility];  Service: Gastroenterology;  Laterality: N/A;   FOOT SURGERY     1983 -tarsal tunnel release   IR GENERIC HISTORICAL  10/26/2016   IR CV LINE  INJECTION 10/26/2016 [facility] RAD   LUMBAR DISC SURGERY  04/04/2012   MASS EXCISION Left 02/06/2021   Procedure: EXCISION LEFT FOOT SOFT TISSUE BETWEEN SECOND AND THIRD AND THIRD AND FOURTH TOES;  Surgeon: Burt Fus, DPM;  Location: [facility];  Service: Podiatry;  Laterality: Left;   PORT-A-CATH REMOVAL N/A 11/15/2016   Procedure: REMOVAL PORT-A-CATH;  Surgeon: Donnice Bury, MD;  Location: [facility];  Service: General;  Laterality: N/A;   PORTACATH PLACEMENT Right 04/02/2016   Procedure: INSERTION PORT-A-CATH WITH US ;  Surgeon: Donnice Bury, MD;  Location: [facility];  Service: General;  Laterality: Right;   RADIOACTIVE SEED GUIDED PARTIAL MASTECTOMY/AXILLARY SENTINEL NODE BIOPSY/AXILLARY NODE DISSECTION Left 09/12/2016   Procedure: LEFT BREAST SEED GUIDED LUMPECTOMY, LEFT AXILLARY SENTINEL NODE BIOPSY, LEFT SEED GUIDED AXILLARY NODE EXCISION( TARGETED AXILLARY DISSECTION), BLUE DYE INJECTION;  Surgeon: Donnice Bury, MD;  Location: [facility];  Service: General;  Laterality: Left;   SACROILIAC JOINT FUSION Left 05/18/2020   Procedure: LEFT SACROILIAC JOINT FUSION;  Surgeon: Beuford Anes, MD;  Location: [facility] OR;  Service: Orthopedics;  Laterality: Left;   SPINAL FUSION  5/12   L5-S1   SPINAL FUSION  05/18/2019   L4-5   TARSAL TUNNEL RELEASE     TOTAL KNEE ARTHROPLASTY Right 09/03/2023   Procedure: RIGHT TOTAL KNEE ARTHROPLASTY;  Surgeon: Addie Cordella Hamilton, MD;  Location: [facility] OR;  Service: Orthopedics;  Laterality: Right;    Allergies as of 09/30/2024       Reactions   Turmeric Diarrhea   Severe diarrhea   Other    Pt can not take steroids via IV or orally   Decadron  [dexamethasone ]    Exacerbates restless leg        Medication List    acetaminophen  500 MG tablet Commonly known as: TYLENOL  Take 1,000 mg by mouth every 8 (eight) hours as needed for moderate pain.   buPROPion  150 MG 24 hr  tablet Commonly known as: WELLBUTRIN  XL Take 150 mg by mouth daily.   chlorpheniramine 4 MG tablet Commonly known as: Chlorphen Take 1 tablet (4 mg total) by mouth at bedtime.   citalopram  40 MG tablet Commonly known as: CELEXA  Take 40 mg by mouth daily.   clonazePAM  1 MG tablet Commonly known as: KLONOPIN  TAKE 1 TABLET AT BEDTIME   docusate sodium  100 MG capsule Commonly known as: COLACE Take 100 mg by mouth 2 (two) times daily.   fluticasone  50 MCG/ACT nasal spray Commonly known as: FLONASE  Place 1 spray into both nostrils daily.   gabapentin  300 MG capsule Commonly known as: NEURONTIN  Take 300 mg by mouth at bedtime.   ipratropium 0.06 % nasal spray Commonly known as: ATROVENT  Place 2 sprays into both nostrils  4 (four) times daily.   multivitamin with minerals tablet Take 1 tablet by mouth at bedtime.   NON FORMULARY Pt uses a c-pap nightly   Pramipexole  Dihydrochloride 0.75 MG Tb24 TAKE 1 TABLET AT BEDTIME   pramipexole  0.5 MG tablet Commonly known as: MIRAPEX  TAKE 1 TABLET EVERY DAY AS NEEDED (TAKE IN ADDITION TO EXTENDED RELEASE DOSE)   Vitamin D 50 MCG (2000 UT) tablet Take 2,000 Units by mouth 2 (two) times daily.    Review of systems negative except as noted in HPI / PMHx or noted below:  Review of Systems  Constitutional: Negative.   HENT: Negative.    Eyes: Negative.   Respiratory: Negative.    Cardiovascular: Negative.   Gastrointestinal: Negative.   Genitourinary: Negative.   Musculoskeletal: Negative.   Skin: Negative.   Neurological: Negative.   Endo/Heme/Allergies: Negative.   Psychiatric/Behavioral: Negative.      Family History  Problem Relation Age of Onset   Heart disease Mother    Pulmonary fibrosis Mother    Hypertension Mother    Sleep apnea Mother    Cancer Father 54       astocytoma   Lymphoma Maternal Aunt    Hypertension Brother    Anesthesia problems Neg Hx    Breast cancer Neg Hx    BRCA 1/2 Neg Hx      Social History   Socioeconomic History   Marital status: Married    Spouse name: Not on file   Number of children: 2   Years of education: Not on file   Highest education level: Not on file  Occupational History   Not on file  Tobacco Use   Smoking status: Never   Smokeless tobacco: Never  Vaping Use   Vaping status: Never Used  Substance and Sexual Activity   Alcohol use: Yes    Alcohol/week: 1.0 standard drink of alcohol    Types: 1 Cans of beer per week    Comment: Rarely 1/month   Drug use: No   Sexual activity: Not Currently    Birth control/protection: Post-menopausal  Other Topics Concern   Not on file  Social History Narrative   Not on file   Social Drivers of Health   Financial Resource Strain: Low Risk  (10/24/2018)   Overall Financial Resource Strain (CARDIA)    Difficulty of Paying Living Expenses: Not hard at all  Food Insecurity: No Food Insecurity (05/16/2024)   Hunger Vital Sign    Worried About Running Out of Food in the Last Year: Never true    Ran Out of Food in the Last Year: Never true  Transportation Needs: No Transportation Needs (05/16/2024)   PRAPARE - Administrator, Civil Service (Medical): No    Lack of Transportation (Non-Medical): No  Physical Activity: Unknown (10/24/2018)   Exercise Vital Sign    Days of Exercise per Week: 0 days    Minutes of Exercise per Session: Not on file  Stress: No Stress Concern Present (10/24/2018)   [employer] of Occupational Health - Occupational Stress Questionnaire    Feeling of Stress : Not at all  Social Connections: Socially Integrated (05/16/2024)   Social Connection and Isolation Panel    Frequency of Communication with Friends and Family: Three times a week    Frequency of Social Gatherings with Friends and Family: Three times a week    Attends Religious Services: More than 4 times per year    Active Member of Clubs or Organizations: Not  on file    Attends Club or  Organization Meetings: More than 4 times per year    Marital Status: Married  Intimate Partner Violence: Not At Risk (05/16/2024)   Humiliation, Afraid, Rape, and Kick questionnaire    Fear of Current or Ex-Partner: No    Emotionally Abused: No    Physically Abused: No    Sexually Abused: No    Environmental and Social history  Lives in a house with a dry environment, a cat and dog located inside the household, no carpet in the bedroom, no plastic in the bed, no plastic on the pillow, no smoking ongoing with inside the household.  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     Comments: Raspy voice, throat clearing, cough  HENT:     Head: Normocephalic.     Right Ear: Tympanic membrane, ear canal and external ear normal.     Left Ear: Tympanic membrane, ear canal and external ear normal.     Nose: Nose normal. No mucosal edema or rhinorrhea.     Mouth/Throat:     Pharynx: Uvula midline. No oropharyngeal exudate.  Eyes:     Conjunctiva/sclera: Conjunctivae normal.  Neck:     Thyroid: No thyromegaly.     Trachea: Trachea normal. No tracheal tenderness or tracheal deviation.  Cardiovascular:     Rate and Rhythm: Normal rate and regular rhythm.     Heart sounds: Normal heart sounds, S1 normal and S2 normal. No murmur heard. Pulmonary:     Effort: No respiratory distress.     Breath sounds: Normal breath sounds. No stridor. No wheezing or rales.  Lymphadenopathy:     Head:     Right side of head: No tonsillar adenopathy.     Left side of head: No tonsillar adenopathy.     Cervical: No cervical adenopathy.  Skin:    Findings: No erythema or rash.     Nails: There is no clubbing.  Neurological:     Mental Status: She is alert.      Diagnostics: Allergy skin tests were performed.   Spirometry was performed and demonstrated an FEV1 of 2.33 @ 104 % of predicted. FEV1/FVC = 0.88  Results of blood tests obtained 23 September 2024 identifies WBC 5.8, absolute eosinophil 400, absolute lymphocyte 1700, hemoglobin 12.6, platelet 245, IgE 8 KU/L, no antigen specific IgE antibodies on an area two aeroallergen profile  Results of a MRI brain obtained 23 August 2022 identified the following:  The mastoid air cells appear normal. The paranasal sinuses appear normal.   Results of a abdominal CT scan obtained 11 May 2024 identifies the following:  The lung bases are clear with the exception of a calcified granuloma in the lingula. The heart is normal in size.   Assessment and Plan:    No diagnosis found.  Patient Instructions   1. Treat and prevent LPR:   A. Pantoprazole  40 mg - 1 tab 2 times per day  B. Famotidine 40 mg - 1 tab in evening  C. Replace throat clearing with swallowing / drinking maneuver  2. Evaluation of throat with ENT  3. Can continue Flonase  and Ipratropium  4. Influenza = Tamiflu. Covid =  Paxlovid  5. Return to clinic in 4 weeks or earlier if problem   Alexis DOROTHA Denis, MD Allergy / Immunology [facility] [place] 

## 2024-10-01 ENCOUNTER — Encounter: Payer: Self-pay | Admitting: Allergy and Immunology

## 2024-10-02 ENCOUNTER — Encounter (INDEPENDENT_AMBULATORY_CARE_PROVIDER_SITE_OTHER): Payer: Self-pay

## 2024-10-03 ENCOUNTER — Other Ambulatory Visit: Payer: Self-pay | Admitting: Adult Health

## 2024-10-05 ENCOUNTER — Encounter: Payer: Self-pay | Admitting: Radiology

## 2024-10-06 DIAGNOSIS — M542 Cervicalgia: Secondary | ICD-10-CM | POA: Diagnosis not present

## 2024-10-13 DIAGNOSIS — K219 Gastro-esophageal reflux disease without esophagitis: Secondary | ICD-10-CM | POA: Diagnosis not present

## 2024-10-13 DIAGNOSIS — F325 Major depressive disorder, single episode, in full remission: Secondary | ICD-10-CM | POA: Diagnosis not present

## 2024-10-13 DIAGNOSIS — Z23 Encounter for immunization: Secondary | ICD-10-CM | POA: Diagnosis not present

## 2024-10-13 DIAGNOSIS — G2581 Restless legs syndrome: Secondary | ICD-10-CM | POA: Diagnosis not present

## 2024-10-13 DIAGNOSIS — M8589 Other specified disorders of bone density and structure, multiple sites: Secondary | ICD-10-CM | POA: Diagnosis not present

## 2024-10-13 DIAGNOSIS — Z0000 Encounter for general adult medical examination without abnormal findings: Secondary | ICD-10-CM | POA: Diagnosis not present

## 2024-10-13 DIAGNOSIS — E7800 Pure hypercholesterolemia, unspecified: Secondary | ICD-10-CM | POA: Diagnosis not present

## 2024-10-13 DIAGNOSIS — H26492 Other secondary cataract, left eye: Secondary | ICD-10-CM | POA: Diagnosis not present

## 2024-10-13 DIAGNOSIS — G4733 Obstructive sleep apnea (adult) (pediatric): Secondary | ICD-10-CM | POA: Diagnosis not present

## 2024-10-15 ENCOUNTER — Other Ambulatory Visit (HOSPITAL_BASED_OUTPATIENT_CLINIC_OR_DEPARTMENT_OTHER): Payer: Self-pay | Admitting: Internal Medicine

## 2024-10-15 DIAGNOSIS — E7800 Pure hypercholesterolemia, unspecified: Secondary | ICD-10-CM

## 2024-10-28 ENCOUNTER — Ambulatory Visit: Admitting: Allergy and Immunology

## 2024-11-03 ENCOUNTER — Ambulatory Visit

## 2024-11-03 ENCOUNTER — Other Ambulatory Visit: Payer: Self-pay | Admitting: *Deleted

## 2024-11-03 MED ORDER — PANTOPRAZOLE SODIUM 40 MG PO TBEC: 40.0000 mg | DELAYED_RELEASE_TABLET | Freq: Two times a day (BID) | ORAL | 1 refills | Status: AC

## 2024-11-09 ENCOUNTER — Ambulatory Visit: Admitting: Allergy and Immunology

## 2024-11-09 VITALS — BP 126/78 | HR 88 | Resp 16

## 2024-11-09 DIAGNOSIS — J3089 Other allergic rhinitis: Secondary | ICD-10-CM

## 2024-11-09 DIAGNOSIS — K219 Gastro-esophageal reflux disease without esophagitis: Secondary | ICD-10-CM

## 2024-11-09 DIAGNOSIS — R410 Disorientation, unspecified: Secondary | ICD-10-CM | POA: Diagnosis not present

## 2024-11-09 NOTE — Progress Notes (Unsigned)
 [place] - [place] - [place] - [place] - [place]   Follow-up Note  Referring Provider: Dwight Trula SQUIBB, MD Primary Provider: Dwight Trula SQUIBB, MD Date of Office Visit: 11/09/2024  Subjective:   Alexis Fox (DOB: 1956/07/04) is a 68 y.o. female who [facility]24 in re-evaluation of the following:  HPI: Alexis Fox returns to this clinic in evaluation of coughing believed secondary to a combination of LPR and respiratory tract inflammation.  I last saw her in this clinic during her initial evaluation of 30 September 2024.  She is doing much better and has resolved 95% of her cough and most of her throat clearing has resolved and she has no issues with her upper airway.  She has been consistently addressing her issue with LPR by using pantoprazole  twice a day and famotidine  evening and being very good about no throat clearing.  She continues on Flonase  and nasal ipratropium as well.  She has an appointment to see ENT 23 November 2024.  Allergies as of 11/09/2024       Reactions   Turmeric Diarrhea   Severe diarrhea   Other    Pt can not take steroids via IV or orally   Decadron  [dexamethasone ]    Exacerbates restless leg        Medication List    acetaminophen  500 MG tablet Commonly known as: TYLENOL  Take 1,000 mg by mouth every 8 (eight) hours as needed for moderate pain.   buPROPion  150 MG 24 hr tablet Commonly known as: WELLBUTRIN  XL Take 150 mg by mouth daily.   chlorpheniramine  4 MG tablet Commonly known as: Chlorphen Take 1 tablet (4 mg total) by mouth at bedtime.   citalopram  40 MG tablet Commonly known as: CELEXA  Take 40 mg by mouth daily.   clonazePAM  1 MG tablet Commonly known as: KLONOPIN  TAKE 1 TABLET AT BEDTIME   docusate sodium  100 MG capsule Commonly known as: COLACE Take 100 mg by mouth 2 (two) times daily.   famotidine  40 MG tablet Commonly known as: PEPCID  Take 1 tablet (40 mg total) by mouth  at bedtime.   fluticasone  50 MCG/ACT nasal spray Commonly known as: FLONASE  Place 1 spray into both nostrils daily.   gabapentin  300 MG capsule Commonly known as: NEURONTIN  Take 300 mg by mouth at bedtime.   ipratropium 0.06 % nasal spray Commonly known as: ATROVENT  Place 2 sprays into both nostrils 4 (four) times daily.   multivitamin with minerals tablet Take 1 tablet by mouth at bedtime.   NON FORMULARY Pt uses a c-pap nightly   pantoprazole  40 MG tablet Commonly known as: PROTONIX  Take 1 tablet (40 mg total) by mouth 2 (two) times daily.   pramipexole  0.5 MG tablet Commonly known as: MIRAPEX  TAKE 1 TABLET EVERY DAY AS NEEDED (TAKE IN ADDITION TO EXTENDED RELEASE DOSE)   Pramipexole  Dihydrochloride 0.75 MG Tb24 TAKE 1 TABLET AT BEDTIME   Vitamin D 50 MCG (2000 UT) tablet Take 2,000 Units by mouth 2 (two) times daily.    Past Medical History:  Diagnosis Date   Anxiety    Arthritis    knees   Breast cancer (HCC)    Breast cancer of upper-outer quadrant of left female breast (HCC) 03/20/2016   C2 cervical fracture (HCC)    10/22/18, following fall from ladder   Cervicalgia    Cholecystitis    Complication of anesthesia    BP crashes post op   Early cataracts, bilateral  Family history of brain cancer    Hot flashes    Personal history of chemotherapy    Personal history of radiation therapy    Restless leg syndrome    Sleep apnea 10/03/2018   wears CPAP    Past Surgical History:  Procedure Laterality Date   ANTERIOR CERVICAL DECOMP/DISCECTOMY FUSION N/A 01/22/2020   Procedure: ACDF - C2-C3;  Surgeon: Joshua Alm RAMAN, MD;  Location: [facility] OR;  Service: Neurosurgery;  Laterality: N/A;   BACK SURGERY     x2   BREAST BIOPSY     BREAST BIOPSY Left 07/31/2024   US  LT BREAST BX W LOC DEV 1ST LESION IMG BX SPEC US  GUIDE 07/31/2024 [facility] MAMMOGRAPHY   BREAST LUMPECTOMY Left 2018   BUNIONECTOMY     CHOLECYSTECTOMY N/A 10/04/2015   Procedure: LAPAROSCOPIC  CHOLECYSTECTOMY WITH INTRAOPERATIVE CHOLANGIOGRAM;  Surgeon: Camellia Blush, MD;  Location: [facility] OR;  Service: General;  Laterality: N/A;   ELBOW SURGERY     right for epicondylitis 2010    ESOPHAGEAL MANOMETRY N/A 11/30/2020   Procedure: ESOPHAGEAL MANOMETRY (EM);  Surgeon: Saintclair Jasper, MD;  [facility]: [facility];  Service: Gastroenterology;  Laterality: N/A;   FOOT SURGERY     1983 -tarsal tunnel release   IR GENERIC HISTORICAL  10/26/2016   IR CV LINE INJECTION 10/26/2016 [facility] RAD   LUMBAR DISC SURGERY  04/04/2012   MASS EXCISION Left 02/06/2021   Procedure: EXCISION LEFT FOOT SOFT TISSUE BETWEEN SECOND AND THIRD AND THIRD AND FOURTH TOES;  Surgeon: Burt Fus, DPM;  Location: [facility];  Service: Podiatry;  Laterality: Left;   PORT-A-CATH REMOVAL N/A 11/15/2016   Procedure: REMOVAL PORT-A-CATH;  Surgeon: Donnice Bury, MD;  Location: [facility];  Service: General;  Laterality: N/A;   PORTACATH PLACEMENT Right 04/02/2016   Procedure: INSERTION PORT-A-CATH WITH US ;  Surgeon: Donnice Bury, MD;  Location: [facility];  Service: General;  Laterality: Right;   RADIOACTIVE SEED GUIDED PARTIAL MASTECTOMY/AXILLARY SENTINEL NODE BIOPSY/AXILLARY NODE DISSECTION Left 09/12/2016   Procedure: LEFT BREAST SEED GUIDED LUMPECTOMY, LEFT AXILLARY SENTINEL NODE BIOPSY, LEFT SEED GUIDED AXILLARY NODE EXCISION( TARGETED AXILLARY DISSECTION), BLUE DYE INJECTION;  Surgeon: Donnice Bury, MD;  Location: [facility];  Service: General;  Laterality: Left;   SACROILIAC JOINT FUSION Left 05/18/2020   Procedure: LEFT SACROILIAC JOINT FUSION;  Surgeon: Beuford Anes, MD;  Location: [facility] OR;  Service: Orthopedics;  Laterality: Left;   SPINAL FUSION  5/12   L5-S1   SPINAL FUSION  05/18/2019   L4-5   TARSAL TUNNEL RELEASE     TOTAL KNEE ARTHROPLASTY Right 09/03/2023   Procedure: RIGHT TOTAL KNEE ARTHROPLASTY;  Surgeon: Addie Cordella Hamilton, MD;   Location: [facility] OR;  Service: Orthopedics;  Laterality: Right;    Review of systems negative except as noted in HPI / PMHx or noted below:  Review of Systems  Constitutional: Negative.   HENT: Negative.    Eyes: Negative.   Respiratory: Negative.    Cardiovascular: Negative.   Gastrointestinal: Negative.   Genitourinary: Negative.   Musculoskeletal: Negative.   Skin: Negative.   Neurological: Negative.   Endo/Heme/Allergies: Negative.   Psychiatric/Behavioral: Negative.       Objective:   There were no vitals filed for this visit.        Physical Exam Constitutional:      Appearance: She is not diaphoretic.  HENT:     Head: Normocephalic.     Right Ear: Tympanic membrane, ear canal  and external ear normal.     Left Ear: Tympanic membrane, ear canal and external ear normal.     Nose: Nose normal. No mucosal edema or rhinorrhea.     Mouth/Throat:     Pharynx: Uvula midline. No oropharyngeal exudate.  Eyes:     Conjunctiva/sclera: Conjunctivae normal.  Neck:     Thyroid: No thyromegaly.     Trachea: Trachea normal. No tracheal tenderness or tracheal deviation.  Cardiovascular:     Rate and Rhythm: Normal rate and regular rhythm.     Heart sounds: Normal heart sounds, S1 normal and S2 normal. No murmur heard. Pulmonary:     Effort: No respiratory distress.     Breath sounds: Normal breath sounds. No stridor. No wheezing or rales.  Lymphadenopathy:     Head:     Right side of head: No tonsillar adenopathy.     Left side of head: No tonsillar adenopathy.     Cervical: No cervical adenopathy.  Skin:    Findings: No erythema or rash.     Nails: There is no clubbing.  Neurological:     Mental Status: She is alert.     Diagnostics: none  Assessment and Plan:   1. LPRD (laryngopharyngeal reflux disease)   2. Other allergic rhinitis    1. Treat and prevent LPR:   A. Pantoprazole  40 mg - 1 tab 2 times per day  B. Famotidine  40 mg - 1 tab in evening  C. Replace  throat clearing with swallowing / drinking maneuver  2. Evaluation of throat with ENT - 23 November 2024  3. Can continue Flonase  and Ipratropium  4. Influenza = Tamiflu. Covid = Paxlovid  5. Return to clinic in 6 months or earlier if problem. Taper medications???  Annice will remain on pretty aggressive therapy for LPR for the next 6 months and if she continues to do well we will then attempt to taper some of her medications.  She can continue on Flonase  and ipratropium for her upper airway disease.  Will see her back in this clinic in 6 months.  Camellia Denis, MD Allergy  / Immunology [facility]

## 2024-11-09 NOTE — Patient Instructions (Signed)
  1. Treat and prevent LPR:   A. Pantoprazole  40 mg - 1 tab 2 times per day  B. Famotidine  40 mg - 1 tab in evening  C. Replace throat clearing with swallowing / drinking maneuver  2. Evaluation of throat with ENT - 23 November 2024  3. Can continue Flonase  and Ipratropium  4. Influenza = Tamiflu. Covid = Paxlovid  5. Return to clinic in 6 months or earlier if problem. Taper medications???

## 2024-11-10 ENCOUNTER — Encounter: Payer: Self-pay | Admitting: Allergy and Immunology

## 2024-11-10 ENCOUNTER — Ambulatory Visit: Admission: RE | Admit: 2024-11-10 | Source: Ambulatory Visit

## 2024-11-12 ENCOUNTER — Ambulatory Visit
Admission: RE | Admit: 2024-11-12 | Discharge: 2024-11-12 | Disposition: A | Discharge status: Home / Self Care | Payer: Self-pay | Source: Ambulatory Visit | Attending: Internal Medicine | Admitting: Internal Medicine

## 2024-11-12 ENCOUNTER — Encounter

## 2024-11-12 DIAGNOSIS — E7800 Pure hypercholesterolemia, unspecified: Secondary | ICD-10-CM | POA: Insufficient documentation

## 2024-11-23 ENCOUNTER — Ambulatory Visit (INDEPENDENT_AMBULATORY_CARE_PROVIDER_SITE_OTHER): Admitting: Otolaryngology

## 2024-11-23 ENCOUNTER — Encounter (INDEPENDENT_AMBULATORY_CARE_PROVIDER_SITE_OTHER): Payer: Self-pay | Admitting: Otolaryngology

## 2024-11-23 VITALS — BP 128/69 | HR 70 | Ht 64.0 in | Wt 216.0 lb

## 2024-11-23 DIAGNOSIS — K219 Gastro-esophageal reflux disease without esophagitis: Secondary | ICD-10-CM | POA: Diagnosis not present

## 2024-11-23 DIAGNOSIS — R053 Chronic cough: Secondary | ICD-10-CM

## 2024-11-23 DIAGNOSIS — R0982 Postnasal drip: Secondary | ICD-10-CM

## 2024-11-23 DIAGNOSIS — R0989 Other specified symptoms and signs involving the circulatory and respiratory systems: Secondary | ICD-10-CM | POA: Diagnosis not present

## 2024-11-23 NOTE — Patient Instructions (Signed)
 Use reflux gourmet after meals and can also use before bed.

## 2024-11-23 NOTE — Progress Notes (Signed)
 Dear Dr. Maurilio, Here is my assessment for our mutual patient, Alexis Fox. Thank you for allowing me the opportunity to care for your patient. Please do not hesitate to contact me should you have any other questions. Sincerely, Dr. Eldora Blanch  Otolaryngology Clinic Note Referring provider: Dr. Maurilio HPI:  Alexis Fox is a 68 y.o. female kindly referred by Dr. Kozlow for evaluation of LPR  Initial visit (11/2024): Discussed the use of AI scribe software for clinical note transcription with the patient, who gave verbal consent to proceed.  History of Present Illness Alexis Fox is a 68 year old female who presents for evaluation of persistent cough and throat symptoms, especially around meals.  She describes recurrent cough beginning in February 2025, with an initial 5-week episode followed by recurrence about six weeks after initial resolution. Both episodes produced clear sputum and were treated with antibiotics, steroids, cough suppressants, and ongoing fluticasone  and ipratropium nasal sprays. She has been evaluated by allergy  and pulmonary, and allergy  testing without significant positives  Her cough is usually productive of clear mucus (intermittently) and is about 90% less frequent now. She has a persistent throat tickle she links to postnasal drip and sinus drainage. Food is a consistent trigger, particularly chocolate, peppermint, and some Chinese dishes, and symptoms are not triggered by perfumes or strong odors. Coughing mostly occurs around meals. She denies facial pain, pressure, fever, purulent nasal discharge, ear pain, odynophagia, or unintentional weight loss. Her voice fluctuates with acid symptoms but is generally normal. She has frequent throat clearing and is trying to reduce this.  She is taking Protonix  twice daily, sometimes missing the second dose, plus nightly famotidine , and continues fluticasone  and ipratropium nasal sprays. She has modified her diet and  feels about 90% improved, with coughing now mainly around meals and minimal symptoms otherwise. Her cough previously limited CPAP use but she is now able to use it again. She has tried tramadol  once and guaifenesin with codeine with little benefit. She denies current antiplatelet use and has only a remote brief tobacco exposure as a teenager.  Does take gabapentin  300mg  for pain.  Patient otherwise denies: - odynophagia, unintentional weight loss -  shortness of breath, hemoptysis - ear pain, neck masses  H&N Surgery: Cervical spine fusion Personal or FHx of bleeding dz or anesthesia difficulty: no  GLP-1: no AP/AC: no  Tobacco: no  PMHx: Breast Cancer, Dysphagia, MDD, OSA on CPAP, RLS, Neuropathy  Independent Review of Additional Tests or Records:  Dr. Maurilio (11/09/2024): noted chronic cough, combination of LPR and respiratory inflammation. 95% of cough gone, most of throat clearing resolved. Using PPI BID and Pepcid  at bedtime. On flonase  and ipratropium; Dx: LPR, AR; Rx: Rx LPR, and eval throat with ENT Dr. 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 Carlie (03/03/2020): throat problem --- diff swallowing, improving; Dx: Dysphagia, rec SLP eval Labs CBC and RAST 09/23/2024: RAST neg, IgE 8; WBC 5.8, Eos 400 PMH/Meds/All/SocHx/FamHx/ROS:   Past Medical History:  Diagnosis Date   Anxiety    Arthritis    knees   Breast cancer (HCC)    Breast cancer of upper-outer quadrant of left female breast (HCC) 03/20/2016   C2 cervical fracture (HCC)    10/22/18, following fall from ladder  Cervicalgia    Cholecystitis    Complication of anesthesia    BP crashes post op   Early cataracts, bilateral    Family history of brain cancer    Hot flashes     Personal history of chemotherapy    Personal history of radiation therapy    Restless leg syndrome    Sleep apnea 10/03/2018   wears CPAP     Past Surgical History:  Procedure Laterality Date   ANTERIOR CERVICAL DECOMP/DISCECTOMY FUSION N/A 01/22/2020   Procedure: ACDF - C2-C3;  Surgeon: Joshua Alm RAMAN, MD;  Location: [facility] OR;  Service: Neurosurgery;  Laterality: N/A;   BACK SURGERY     x2   BREAST BIOPSY     BREAST BIOPSY Left 07/31/2024   US  LT BREAST BX W LOC DEV 1ST LESION IMG BX SPEC US  GUIDE 07/31/2024 [facility] MAMMOGRAPHY   BREAST LUMPECTOMY Left 2018   BUNIONECTOMY     CHOLECYSTECTOMY N/A 10/04/2015   Procedure: LAPAROSCOPIC CHOLECYSTECTOMY WITH INTRAOPERATIVE CHOLANGIOGRAM;  Surgeon: Camellia Blush, MD;  Location: [facility] OR;  Service: General;  Laterality: N/A;   ELBOW SURGERY     right for epicondylitis 2010    ESOPHAGEAL MANOMETRY N/A 11/30/2020   Procedure: ESOPHAGEAL MANOMETRY (EM);  Surgeon: Saintclair Jasper, MD;  [facility]: [facility];  Service: Gastroenterology;  Laterality: N/A;   FOOT SURGERY     1983 -tarsal tunnel release   IR GENERIC HISTORICAL  10/26/2016   IR CV LINE INJECTION 10/26/2016 [facility]   LUMBAR DISC SURGERY  04/04/2012   MASS EXCISION Left 02/06/2021   Procedure: EXCISION LEFT FOOT SOFT TISSUE BETWEEN SECOND AND THIRD AND THIRD AND FOURTH TOES;  Surgeon: Burt Fus, DPM;  Location: [facility];  Service: Podiatry;  Laterality: Left;   PORT-A-CATH REMOVAL N/A 11/15/2016   Procedure: REMOVAL PORT-A-CATH;  Surgeon: Donnice Bury, MD;  Location: [facility];  Service: General;  Laterality: N/A;   PORTACATH PLACEMENT Right 04/02/2016   Procedure: INSERTION PORT-A-CATH WITH US ;  Surgeon: Donnice Bury, MD;  Location: [facility];  Service: General;  Laterality: Right;   RADIOACTIVE SEED GUIDED PARTIAL MASTECTOMY/AXILLARY SENTINEL NODE BIOPSY/AXILLARY NODE DISSECTION Left 09/12/2016   Procedure: LEFT BREAST SEED GUIDED  LUMPECTOMY, LEFT AXILLARY SENTINEL NODE BIOPSY, LEFT SEED GUIDED AXILLARY NODE EXCISION( TARGETED AXILLARY DISSECTION), BLUE DYE INJECTION;  Surgeon: Donnice Bury, MD;  Location: [facility];  Service: General;  Laterality: Left;   SACROILIAC JOINT FUSION Left 05/18/2020   Procedure: LEFT SACROILIAC JOINT FUSION;  Surgeon: Beuford Anes, MD;  Location: [facility] OR;  Service: Orthopedics;  Laterality: Left;   SPINAL FUSION  5/12   L5-S1   SPINAL FUSION  05/18/2019   L4-5   TARSAL TUNNEL RELEASE     TOTAL KNEE ARTHROPLASTY Right 09/03/2023   Procedure: RIGHT TOTAL KNEE ARTHROPLASTY;  Surgeon: Addie Cordella Hamilton, MD;  Location: [facility] OR;  Service: Orthopedics;  Laterality: Right;    Family History  Problem Relation Age of Onset   Heart disease Mother    Pulmonary fibrosis Mother    Hypertension Mother    Sleep apnea Mother    Cancer Father 53       astocytoma   Lymphoma Maternal Aunt    Hypertension Brother    Anesthesia problems Neg Hx    Breast cancer Neg Hx    BRCA 1/2 Neg Hx      Social Connections: Socially Integrated (05/16/2024)   Social Connection and Isolation Panel    Frequency  of Communication with Friends and Family: Three times a week    Frequency of Social Gatherings with Friends and Family: Three times a week    Attends Religious Services: More than 4 times per year    Active Member of Clubs or Organizations: Not on file    Attends Banker Meetings: More than 4 times per year    Marital Status: Married     Current Medications[1]   Physical Exam:   BP 128/69 (BP Location: Right Arm, Patient Position: Sitting, Cuff Size: Large)   Pulse 70   Ht 5' 4 (1.626 m)   Wt 216 lb (98 kg)   SpO2 95%   BMI 37.08 kg/m   Salient findings:  CN II-XII intact Bilateral EAC clear and TM intact with well pneumatized middle ear spaces Anterior rhinoscopy: Septum intact; bilateral inferior turbinates without significant hypertrophy No lesions of oral  cavity/oropharynx No obviously palpable neck masses/lymphadenopathy/thyromegaly - neck scar well healed No respiratory distress or stridor; voice quality class 1.5; no significant coughing; modest throat clearing; TFL was indicated to better evaluate the proximal airway, given the patient's history and exam findings, and is detailed below.  Seprately Identifiable Procedures:  Prior to initiating any procedures, risks/benefits/alternatives were explained to the patient and verbal consent obtained. Procedure Note Pre-procedure diagnosis: Chronic cough and throat clearing, LPR Post-procedure diagnosis: Same Procedure: Transnasal Fiberoptic Laryngoscopy, CPT 31575 - Mod 25 Indication: see above Complications: None apparent EBL: 0 mL  The procedure was undertaken to further evaluate the patient's complaint above , with mirror exam inadequate for appropriate examination due to gag reflex and poor patient tolerance  Procedure:  Patient was identified as correct patient. Verbal consent was obtained. The nose was sprayed with oxymetazoline and 4% lidocaine . The The flexible laryngoscope was passed through the nose to view the nasal cavity, pharynx (oropharynx, hypopharynx) and larynx.  The larynx was examined at rest and during multiple phonatory tasks. Documentation was obtained and reviewed with patient. The scope was removed. The patient tolerated the procedure well.  Findings: The nasal cavity and nasopharynx did not reveal any masses or lesions, mucosa appeared to be without obvious lesions. The tongue base, pharyngeal walls, piriform sinuses, vallecula, epiglottis and postcricoid region are normal in appearance --- without significant retained pyriform secretion, mild redundant post cricoid mucosa. The visualized portion of the subglottis and proximal trachea is widely patent. The vocal folds are mobile bilaterally. There are no lesions on the free edge of the vocal folds nor elsewhere in the larynx  worrisome for malignancy.    Electronically signed by: Eldora KATHEE Blanch, MD 11/25/2024 10:43 AM    Impression & Plans:  Alexis Fox is a 68 y.o. female with:  1. Chronic cough   2. Chronic throat clearing   3. Post-nasal drip   4. Laryngopharyngeal reflux (LPR)    Sx now 90% improved except throat clearing after diet modification and LPR Rx. Given improvement and her history, seems most likely she is having LPR with cough mostly around meals. No Red flag sx. TFL overall reassuring We discussed options: observation, voice Rx for throat clearing, continued medical mgmt, GI ref (given improvement, feel less warranted) She opted for continued medical mgmt. Will continue reflux regimen, diet changes and will add reflux gourmet after meals and before bed Throat clearing strategies discussed Improve laryngeal hydration  D/w pt f/u, she will call if sx do not improve or worsen  See below regarding exact medications prescribed this encounter including dosages and  route: No orders of the defined types were placed in this encounter.     Thank you for allowing me the opportunity to care for your patient. Please do not hesitate to contact me should you have any other questions.  Sincerely, Eldora Blanch, MD Otolaryngologist (ENT), [facility] Phone: [phone] Fax: [phone]  11/25/2024, 10:43 AM   MDM:  I have personally spent 47 minutes involved in face-to-face and non-face-to-face activities for this patient on the day of the visit.  Professional time spent excludes any procedures performed but includes the following activities, in addition to those noted in the documentation: preparing to see the patient (review of outside documentation and results), performing a medically appropriate examination, counseling,  documenting in the electronic health record, independently interpreting results       [1]  Current Outpatient Medications:    acetaminophen  (TYLENOL )  500 MG tablet, Take 1,000 mg by mouth every 8 (eight) hours as needed for moderate pain., Disp: , Rfl:    buPROPion  (WELLBUTRIN  XL) 150 MG 24 hr tablet, Take 150 mg by mouth daily., Disp: , Rfl: 4   chlorpheniramine  (CHLORPHEN) 4 MG tablet, Take 1 tablet (4 mg total) by mouth at bedtime., Disp: 14 tablet, Rfl: 0   Cholecalciferol (VITAMIN D) 50 MCG (2000 UT) tablet, Take 2,000 Units by mouth 2 (two) times daily., Disp: , Rfl:    citalopram  (CELEXA ) 40 MG tablet, Take 40 mg by mouth daily., Disp: , Rfl: 3   clonazePAM  (KLONOPIN ) 1 MG tablet, TAKE 1 TABLET AT BEDTIME, Disp: 30 tablet, Rfl: 5   docusate sodium  (COLACE) 100 MG capsule, Take 100 mg by mouth 2 (two) times daily., Disp: , Rfl:    famotidine  (PEPCID ) 40 MG tablet, Take 1 tablet (40 mg total) by mouth at bedtime., Disp: 90 tablet, Rfl: 1   fluticasone  (FLONASE ) 50 MCG/ACT nasal spray, Place 1 spray into both nostrils daily., Disp: , Rfl:    gabapentin  (NEURONTIN ) 300 MG capsule, Take 300 mg by mouth at bedtime., Disp: , Rfl:    ipratropium (ATROVENT ) 0.06 % nasal spray, Place 2 sprays into both nostrils 4 (four) times daily., Disp: 15 mL, Rfl: 2   Multiple Vitamins-Minerals (MULTIVITAMIN WITH MINERALS) tablet, Take 1 tablet by mouth at bedtime., Disp: , Rfl:    NON FORMULARY, Pt uses a c-pap nightly, Disp: , Rfl:    pantoprazole  (PROTONIX ) 40 MG tablet, Take 1 tablet (40 mg total) by mouth 2 (two) times daily., Disp: 180 tablet, Rfl: 1   pramipexole  (MIRAPEX ) 0.5 MG tablet, TAKE 1 TABLET EVERY DAY AS NEEDED (TAKE IN ADDITION TO EXTENDED RELEASE DOSE), Disp: 30 tablet, Rfl: 5   Pramipexole  Dihydrochloride 0.75 MG TB24, TAKE 1 TABLET AT BEDTIME, Disp: 60 tablet, Rfl: 5   rosuvastatin (CRESTOR) 5 MG tablet, Take 5 mg by mouth daily., Disp: , Rfl:  No current facility-administered medications for this visit.  Facility-Administered Medications Ordered in Other Visits:    sodium chloride  flush (NS) 0.9 % injection 10 mL, 10 mL, Intracatheter,  PRN, Gudena, Vinay, MD, 10 mL at 07/19/16 1434

## 2024-11-24 ENCOUNTER — Ambulatory Visit

## 2024-12-09 ENCOUNTER — Ambulatory Visit (INDEPENDENT_AMBULATORY_CARE_PROVIDER_SITE_OTHER)

## 2024-12-09 ENCOUNTER — Ambulatory Visit: Admitting: Podiatry

## 2024-12-09 DIAGNOSIS — M7752 Other enthesopathy of left foot: Secondary | ICD-10-CM

## 2024-12-09 DIAGNOSIS — L84 Corns and callosities: Secondary | ICD-10-CM | POA: Diagnosis not present

## 2024-12-09 NOTE — Progress Notes (Signed)
 "   Chief Complaint  Patient presents with   Foot Pain    LT foot pain, primarily between the 2nd & 3rd toe. Has had surgery on this foot before. Began bothering her again 6 months ago. Has not tried any otc treatments. Not diabetic, no anticoag.    Discussed the use of AI scribe software for clinical note transcription with the patient, who gave verbal consent to proceed.  History of Present Illness Alexis Fox is a 69 year old female with prior left bunion and second metatarsal surgery who presents for evaluation of persistent left second metatarsal pain.  She experiences persistent pain localized to the left second metatarsal head, which is exacerbated by standing barefoot, particularly in the shower. She avoids going barefoot except when showering, as wearing shoes with built-in support and custom orthotics provides adequate relief during daily activities.  She works on her callus at home. She expresses concern regarding possible neuroma, metatarsal head pathology, or retained hardware.  She previously underwent left bunion and second metatarsal surgery with pin and screw placement by Dr. Burt. The first metatarsal pin was removed at the time of surgery due to interference with motorcycle gear shifting. She is not currently taking medications or using analgesics for this issue.     Past Medical History:  Diagnosis Date   Anxiety    Arthritis    knees   Breast cancer (HCC)    Breast cancer of upper-outer quadrant of left female breast (HCC) 03/20/2016   C2 cervical fracture (HCC)    10/22/18, following fall from ladder   Cervicalgia    Cholecystitis    Complication of anesthesia    BP crashes post op   Early cataracts, bilateral    Family history of brain cancer    Hot flashes    Personal history of chemotherapy    Personal history of radiation therapy    Restless leg syndrome    Sleep apnea 10/03/2018   wears CPAP   Past Surgical History:  Procedure  Laterality Date   ANTERIOR CERVICAL DECOMP/DISCECTOMY FUSION N/A 01/22/2020   Procedure: ACDF - C2-C3;  Surgeon: Joshua Alm RAMAN, MD;  Location: [facility] OR;  Service: Neurosurgery;  Laterality: N/A;   BACK SURGERY     x2   BREAST BIOPSY     BREAST BIOPSY Left 07/31/2024   US  LT BREAST BX W LOC DEV 1ST LESION IMG BX SPEC US  GUIDE 07/31/2024 [facility] MAMMOGRAPHY   BREAST LUMPECTOMY Left 2018   BUNIONECTOMY     CHOLECYSTECTOMY N/A 10/04/2015   Procedure: LAPAROSCOPIC CHOLECYSTECTOMY WITH INTRAOPERATIVE CHOLANGIOGRAM;  Surgeon: Camellia Blush, MD;  Location: [facility] OR;  Service: General;  Laterality: N/A;   ELBOW SURGERY     right for epicondylitis 2010    ESOPHAGEAL MANOMETRY N/A 11/30/2020   Procedure: ESOPHAGEAL MANOMETRY (EM);  Surgeon: Saintclair Jasper, MD;  Location: [facility];  Service: Gastroenterology;  Laterality: N/A;   FOOT SURGERY     1983 -tarsal tunnel release   IR GENERIC HISTORICAL  10/26/2016   IR CV LINE INJECTION 10/26/2016 [facility]   LUMBAR DISC SURGERY  04/04/2012   MASS EXCISION Left 02/06/2021   Procedure: EXCISION LEFT FOOT SOFT TISSUE BETWEEN SECOND AND THIRD AND THIRD AND FOURTH TOES;  Surgeon: Burt Fus, DPM;  Location: [facility];  Service: Podiatry;  Laterality: Left;   PORT-A-CATH REMOVAL N/A 11/15/2016   Procedure: REMOVAL PORT-A-CATH;  Surgeon: Donnice Bury, MD;  Location: [facility];  Service: General;  Laterality: N/A;   PORTACATH PLACEMENT Right 04/02/2016   Procedure: INSERTION PORT-A-CATH WITH US ;  Surgeon: Donnice Bury, MD;  Location: [facility];  Service: General;  Laterality: Right;   RADIOACTIVE SEED GUIDED PARTIAL MASTECTOMY/AXILLARY SENTINEL NODE BIOPSY/AXILLARY NODE DISSECTION Left 09/12/2016   Procedure: LEFT BREAST SEED GUIDED LUMPECTOMY, LEFT AXILLARY SENTINEL NODE BIOPSY, LEFT SEED GUIDED AXILLARY NODE EXCISION( TARGETED AXILLARY DISSECTION), BLUE DYE INJECTION;  Surgeon: Donnice Bury, MD;  Location:  [facility];  Service: General;  Laterality: Left;   SACROILIAC JOINT FUSION Left 05/18/2020   Procedure: LEFT SACROILIAC JOINT FUSION;  Surgeon: Beuford Anes, MD;  Location: [facility] OR;  Service: Orthopedics;  Laterality: Left;   SPINAL FUSION  5/12   L5-S1   SPINAL FUSION  05/18/2019   L4-5   TARSAL TUNNEL RELEASE     TOTAL KNEE ARTHROPLASTY Right 09/03/2023   Procedure: RIGHT TOTAL KNEE ARTHROPLASTY;  Surgeon: Addie Cordella Hamilton, MD;  Location: [facility];  Service: Orthopedics;  Laterality: Right;   Allergies[1]  Physical Exam EXTREMITIES: Palpable pedal pulses. Pain on palpation to the plantar segment of the second metatarsal head. NEUROLOGICAL: Epicritic sensation intact.  Negative Mulder sign with compression of the second interspace left foot SKIN: No surrounding erythema.  Minimal callus present left submet 2.  No open lesions or evidence of foreign body.    Results Left foot radiographic imaging (3 standard images plus sesamoid axial view) Metatarsal parabola within normal limits. Screw in head of second metatarsal retained in good position. No threads protruding through plantar cortex on sesamoid axial view. No evidence of foreign body.  No fracture noted.    Assessment/Plan of Care: 1. Capsulitis of metatarsophalangeal (MTP) joint of left foot   2. Callus of foot     Assessment & Plan Capsulitis of the left second metatarsophalangeal joint with post-surgical pain She experiences persistent pain localized to the plantar aspect of the left second metatarsal head following prior bunion and metatarsal surgery. Imaging revealed a retained screw in good position without threads protruding through the plantar cortex. There is a small callus segment and pain on palpation, without surrounding erythema or evidence of foreign body. Metatarsal parabola and alignment are within normal limits, and no true deformity is present. Symptoms are most pronounced when barefoot but are  well-managed with shoes and orthotics. Surgical options, including screw removal and plantar condyle shaving, were discussed but deferred due to manageable symptoms and absence of extensive callus or deformity. Risks of surgical intervention include postoperative healing complications and potential for persistent symptoms; conservative management is anticipated to maintain symptom control with footwear and orthotics. - Obtained sesamoid axial view x-ray to assess screw position and possible thread protrusion. - Recommended continued use of shoes and orthotics to offload pressure from the affected area. - Discussed surgical options, including screw removal and condylar shaving, but deferred intervention as symptoms are manageable and there is no extensive callus or deformity. - Provided anticipatory guidance to contact the office if symptoms worsen or if she desires surgical intervention in the future.  Follow-up as needed  Awanda CHARM Imperial, DPM, FACFAS [facility]     [address] [place], [place] [address]  Office [phone]  Fax [phone]      [1]  Allergies Allergen Reactions   Turmeric Diarrhea    Severe diarrhea   Other     Pt can not take steroids via IV or orally   Decadron  [Dexamethasone ]     Exacerbates restless leg   "

## 2024-12-16 ENCOUNTER — Encounter: Payer: Self-pay | Admitting: Allergy and Immunology

## 2025-01-25 ENCOUNTER — Ambulatory Visit: Admitting: Neurology

## 2025-02-01 ENCOUNTER — Ambulatory Visit: Admitting: Psychology

## 2025-05-10 ENCOUNTER — Ambulatory Visit: Admitting: Allergy and Immunology

## 2025-05-26 ENCOUNTER — Ambulatory Visit: Admitting: Adult Health
